# Patient Record
Sex: Female | Born: 1940 | Race: White | Hispanic: No | Smoking: Never smoker
Health system: Southern US, Community
[De-identification: ages and names within clinical notes are randomized; demographics above are authoritative.]

## PROBLEM LIST (undated history)

## (undated) DIAGNOSIS — K5792 Diverticulitis of intestine, part unspecified, without perforation or abscess without bleeding: Secondary | ICD-10-CM

## (undated) DIAGNOSIS — I10 Essential (primary) hypertension: Secondary | ICD-10-CM

## (undated) DIAGNOSIS — K219 Gastro-esophageal reflux disease without esophagitis: Secondary | ICD-10-CM

## (undated) DIAGNOSIS — E785 Hyperlipidemia, unspecified: Secondary | ICD-10-CM

## (undated) DIAGNOSIS — I38 Endocarditis, valve unspecified: Secondary | ICD-10-CM

## (undated) DIAGNOSIS — M1990 Unspecified osteoarthritis, unspecified site: Secondary | ICD-10-CM

## (undated) DIAGNOSIS — C801 Malignant (primary) neoplasm, unspecified: Secondary | ICD-10-CM

## (undated) DIAGNOSIS — D649 Anemia, unspecified: Secondary | ICD-10-CM

## (undated) DIAGNOSIS — R7611 Nonspecific reaction to tuberculin skin test without active tuberculosis: Secondary | ICD-10-CM

## (undated) DIAGNOSIS — M069 Rheumatoid arthritis, unspecified: Secondary | ICD-10-CM

## (undated) DIAGNOSIS — M810 Age-related osteoporosis without current pathological fracture: Secondary | ICD-10-CM

## (undated) HISTORY — DX: Unspecified osteoarthritis, unspecified site: M19.90

## (undated) HISTORY — DX: Essential (primary) hypertension: I10

## (undated) HISTORY — DX: Age-related osteoporosis without current pathological fracture: M81.0

## (undated) HISTORY — PX: OVARY SURGERY: SHX727

## (undated) HISTORY — DX: Rheumatoid arthritis, unspecified: M06.9

## (undated) HISTORY — DX: Nonspecific reaction to tuberculin skin test without active tuberculosis: R76.11

## (undated) HISTORY — DX: Anemia, unspecified: D64.9

## (undated) HISTORY — DX: Gastro-esophageal reflux disease without esophagitis: K21.9

## (undated) HISTORY — PX: ABDOMINAL HYSTERECTOMY: SHX81

## (undated) HISTORY — PX: CERVICAL CONE BIOPSY: SUR198

## (undated) HISTORY — PX: TUBAL LIGATION: SHX77

## (undated) HISTORY — DX: Hyperlipidemia, unspecified: E78.5

## (undated) HISTORY — DX: Diverticulitis of intestine, part unspecified, without perforation or abscess without bleeding: K57.92

## (undated) HISTORY — DX: Endocarditis, valve unspecified: I38

---
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
## 2004-08-02 ENCOUNTER — Ambulatory Visit: Payer: Self-pay | Admitting: Emergency Medicine

## 2004-08-02 ENCOUNTER — Emergency Department: Payer: Self-pay | Admitting: Emergency Medicine

## 2004-08-03 ENCOUNTER — Ambulatory Visit: Payer: Self-pay | Admitting: Emergency Medicine

## 2004-12-08 ENCOUNTER — Ambulatory Visit: Payer: Self-pay | Admitting: Unknown Physician Specialty

## 2004-12-13 ENCOUNTER — Ambulatory Visit: Payer: Self-pay | Admitting: Unknown Physician Specialty

## 2004-12-30 ENCOUNTER — Ambulatory Visit: Payer: Self-pay | Admitting: Unknown Physician Specialty

## 2005-03-31 ENCOUNTER — Inpatient Hospital Stay: Payer: Self-pay | Admitting: Unknown Physician Specialty

## 2005-05-31 ENCOUNTER — Ambulatory Visit: Payer: Self-pay | Admitting: Internal Medicine

## 2005-06-08 ENCOUNTER — Ambulatory Visit: Payer: Self-pay

## 2005-06-30 ENCOUNTER — Ambulatory Visit: Payer: Self-pay | Admitting: Pain Medicine

## 2005-07-13 ENCOUNTER — Ambulatory Visit: Payer: Self-pay | Admitting: Pain Medicine

## 2005-08-16 ENCOUNTER — Ambulatory Visit: Payer: Self-pay | Admitting: Pain Medicine

## 2005-08-24 ENCOUNTER — Ambulatory Visit: Payer: Self-pay | Admitting: Pain Medicine

## 2005-10-04 ENCOUNTER — Ambulatory Visit: Payer: Self-pay | Admitting: Pain Medicine

## 2005-10-10 ENCOUNTER — Ambulatory Visit: Payer: Self-pay | Admitting: Pain Medicine

## 2005-11-17 ENCOUNTER — Ambulatory Visit: Payer: Self-pay | Admitting: Pain Medicine

## 2005-11-23 ENCOUNTER — Ambulatory Visit: Payer: Self-pay | Admitting: Pain Medicine

## 2006-01-03 ENCOUNTER — Ambulatory Visit: Payer: Self-pay | Admitting: Pain Medicine

## 2006-01-09 ENCOUNTER — Ambulatory Visit: Payer: Self-pay | Admitting: Pain Medicine

## 2006-02-09 ENCOUNTER — Ambulatory Visit: Payer: Self-pay | Admitting: Pain Medicine

## 2006-03-01 ENCOUNTER — Ambulatory Visit: Payer: Self-pay | Admitting: Pain Medicine

## 2006-05-11 ENCOUNTER — Ambulatory Visit: Payer: Self-pay | Admitting: Pain Medicine

## 2006-06-06 ENCOUNTER — Ambulatory Visit: Payer: Self-pay | Admitting: Internal Medicine

## 2006-06-27 ENCOUNTER — Ambulatory Visit: Payer: Self-pay | Admitting: Pain Medicine

## 2006-08-29 ENCOUNTER — Ambulatory Visit: Payer: Self-pay | Admitting: Pain Medicine

## 2006-11-21 ENCOUNTER — Ambulatory Visit: Payer: Self-pay | Admitting: Pain Medicine

## 2006-12-10 ENCOUNTER — Ambulatory Visit: Payer: Self-pay | Admitting: Internal Medicine

## 2006-12-20 ENCOUNTER — Ambulatory Visit: Payer: Self-pay | Admitting: Internal Medicine

## 2007-01-09 ENCOUNTER — Ambulatory Visit: Payer: Self-pay | Admitting: Internal Medicine

## 2007-03-01 ENCOUNTER — Ambulatory Visit: Payer: Self-pay | Admitting: Pain Medicine

## 2007-05-22 ENCOUNTER — Ambulatory Visit: Payer: Self-pay | Admitting: Pain Medicine

## 2007-07-19 ENCOUNTER — Ambulatory Visit: Payer: Self-pay | Admitting: Internal Medicine

## 2007-08-14 ENCOUNTER — Ambulatory Visit: Payer: Self-pay | Admitting: Pain Medicine

## 2007-09-12 ENCOUNTER — Ambulatory Visit: Payer: Self-pay | Admitting: Neurosurgery

## 2007-10-02 ENCOUNTER — Ambulatory Visit: Payer: Self-pay | Admitting: Pain Medicine

## 2007-12-04 ENCOUNTER — Ambulatory Visit: Payer: Self-pay | Admitting: Pain Medicine

## 2008-03-18 ENCOUNTER — Ambulatory Visit: Payer: Self-pay | Admitting: Pain Medicine

## 2008-08-07 ENCOUNTER — Ambulatory Visit: Payer: Self-pay | Admitting: Pain Medicine

## 2008-08-07 ENCOUNTER — Ambulatory Visit: Payer: Self-pay | Admitting: Internal Medicine

## 2008-08-14 ENCOUNTER — Ambulatory Visit: Payer: Self-pay | Admitting: Internal Medicine

## 2008-08-16 ENCOUNTER — Inpatient Hospital Stay: Payer: Self-pay | Admitting: Internal Medicine

## 2008-08-27 ENCOUNTER — Ambulatory Visit: Payer: Self-pay | Admitting: Internal Medicine

## 2008-11-04 ENCOUNTER — Ambulatory Visit: Payer: Self-pay | Admitting: Pain Medicine

## 2008-11-18 ENCOUNTER — Ambulatory Visit: Payer: Self-pay | Admitting: Rheumatology

## 2009-03-05 ENCOUNTER — Ambulatory Visit: Payer: Self-pay | Admitting: Pain Medicine

## 2009-07-08 ENCOUNTER — Ambulatory Visit: Payer: Self-pay | Admitting: Pain Medicine

## 2009-10-23 ENCOUNTER — Emergency Department: Payer: Self-pay | Admitting: Emergency Medicine

## 2009-10-29 ENCOUNTER — Ambulatory Visit: Payer: Self-pay | Admitting: Internal Medicine

## 2010-09-21 ENCOUNTER — Ambulatory Visit: Payer: Self-pay | Admitting: Internal Medicine

## 2010-11-24 ENCOUNTER — Ambulatory Visit: Payer: Self-pay | Admitting: Internal Medicine

## 2011-07-13 DIAGNOSIS — E039 Hypothyroidism, unspecified: Secondary | ICD-10-CM | POA: Diagnosis not present

## 2011-07-21 DIAGNOSIS — J339 Nasal polyp, unspecified: Secondary | ICD-10-CM | POA: Diagnosis not present

## 2011-08-23 DIAGNOSIS — M069 Rheumatoid arthritis, unspecified: Secondary | ICD-10-CM | POA: Diagnosis not present

## 2011-09-19 DIAGNOSIS — E119 Type 2 diabetes mellitus without complications: Secondary | ICD-10-CM | POA: Diagnosis not present

## 2011-09-19 DIAGNOSIS — I10 Essential (primary) hypertension: Secondary | ICD-10-CM | POA: Diagnosis not present

## 2011-09-19 DIAGNOSIS — I2510 Atherosclerotic heart disease of native coronary artery without angina pectoris: Secondary | ICD-10-CM | POA: Diagnosis not present

## 2011-09-19 DIAGNOSIS — E7800 Pure hypercholesterolemia, unspecified: Secondary | ICD-10-CM | POA: Diagnosis not present

## 2011-09-19 DIAGNOSIS — R5381 Other malaise: Secondary | ICD-10-CM | POA: Diagnosis not present

## 2011-09-19 DIAGNOSIS — R7989 Other specified abnormal findings of blood chemistry: Secondary | ICD-10-CM | POA: Diagnosis not present

## 2011-09-19 DIAGNOSIS — D649 Anemia, unspecified: Secondary | ICD-10-CM | POA: Diagnosis not present

## 2011-09-23 DIAGNOSIS — R05 Cough: Secondary | ICD-10-CM | POA: Diagnosis not present

## 2011-09-26 DIAGNOSIS — E7800 Pure hypercholesterolemia, unspecified: Secondary | ICD-10-CM | POA: Diagnosis not present

## 2011-09-26 DIAGNOSIS — M1990 Unspecified osteoarthritis, unspecified site: Secondary | ICD-10-CM | POA: Diagnosis not present

## 2011-09-26 DIAGNOSIS — R350 Frequency of micturition: Secondary | ICD-10-CM | POA: Diagnosis not present

## 2011-09-26 DIAGNOSIS — D649 Anemia, unspecified: Secondary | ICD-10-CM | POA: Diagnosis not present

## 2011-09-26 DIAGNOSIS — I10 Essential (primary) hypertension: Secondary | ICD-10-CM | POA: Diagnosis not present

## 2011-10-03 DIAGNOSIS — R05 Cough: Secondary | ICD-10-CM | POA: Diagnosis not present

## 2011-10-12 DIAGNOSIS — J40 Bronchitis, not specified as acute or chronic: Secondary | ICD-10-CM | POA: Diagnosis not present

## 2011-10-19 DIAGNOSIS — R05 Cough: Secondary | ICD-10-CM | POA: Diagnosis not present

## 2011-10-19 DIAGNOSIS — J310 Chronic rhinitis: Secondary | ICD-10-CM | POA: Diagnosis not present

## 2011-11-01 DIAGNOSIS — M069 Rheumatoid arthritis, unspecified: Secondary | ICD-10-CM | POA: Diagnosis not present

## 2011-11-07 DIAGNOSIS — R05 Cough: Secondary | ICD-10-CM | POA: Diagnosis not present

## 2011-11-07 DIAGNOSIS — J45909 Unspecified asthma, uncomplicated: Secondary | ICD-10-CM | POA: Diagnosis not present

## 2011-11-07 DIAGNOSIS — J310 Chronic rhinitis: Secondary | ICD-10-CM | POA: Diagnosis not present

## 2011-11-10 DIAGNOSIS — I10 Essential (primary) hypertension: Secondary | ICD-10-CM | POA: Diagnosis not present

## 2011-11-10 DIAGNOSIS — K219 Gastro-esophageal reflux disease without esophagitis: Secondary | ICD-10-CM | POA: Diagnosis not present

## 2011-11-10 DIAGNOSIS — E119 Type 2 diabetes mellitus without complications: Secondary | ICD-10-CM | POA: Diagnosis not present

## 2011-11-10 DIAGNOSIS — E7800 Pure hypercholesterolemia, unspecified: Secondary | ICD-10-CM | POA: Diagnosis not present

## 2011-11-28 ENCOUNTER — Ambulatory Visit: Payer: Self-pay | Admitting: Internal Medicine

## 2011-11-28 DIAGNOSIS — N6459 Other signs and symptoms in breast: Secondary | ICD-10-CM | POA: Diagnosis not present

## 2011-11-28 DIAGNOSIS — Z1231 Encounter for screening mammogram for malignant neoplasm of breast: Secondary | ICD-10-CM | POA: Diagnosis not present

## 2011-11-30 ENCOUNTER — Ambulatory Visit: Payer: Self-pay | Admitting: Internal Medicine

## 2011-11-30 DIAGNOSIS — N6489 Other specified disorders of breast: Secondary | ICD-10-CM | POA: Diagnosis not present

## 2011-11-30 DIAGNOSIS — N6459 Other signs and symptoms in breast: Secondary | ICD-10-CM | POA: Diagnosis not present

## 2011-12-13 DIAGNOSIS — M069 Rheumatoid arthritis, unspecified: Secondary | ICD-10-CM | POA: Diagnosis not present

## 2011-12-29 DIAGNOSIS — K219 Gastro-esophageal reflux disease without esophagitis: Secondary | ICD-10-CM | POA: Diagnosis not present

## 2011-12-29 DIAGNOSIS — I10 Essential (primary) hypertension: Secondary | ICD-10-CM | POA: Diagnosis not present

## 2012-01-03 DIAGNOSIS — I10 Essential (primary) hypertension: Secondary | ICD-10-CM | POA: Diagnosis not present

## 2012-01-03 DIAGNOSIS — J45909 Unspecified asthma, uncomplicated: Secondary | ICD-10-CM | POA: Diagnosis not present

## 2012-01-03 DIAGNOSIS — J310 Chronic rhinitis: Secondary | ICD-10-CM | POA: Diagnosis not present

## 2012-01-03 DIAGNOSIS — R05 Cough: Secondary | ICD-10-CM | POA: Diagnosis not present

## 2012-01-06 DIAGNOSIS — Z79899 Other long term (current) drug therapy: Secondary | ICD-10-CM | POA: Diagnosis not present

## 2012-01-06 DIAGNOSIS — E7800 Pure hypercholesterolemia, unspecified: Secondary | ICD-10-CM | POA: Diagnosis not present

## 2012-01-06 DIAGNOSIS — D649 Anemia, unspecified: Secondary | ICD-10-CM | POA: Diagnosis not present

## 2012-01-06 DIAGNOSIS — K219 Gastro-esophageal reflux disease without esophagitis: Secondary | ICD-10-CM | POA: Diagnosis not present

## 2012-01-06 DIAGNOSIS — R7989 Other specified abnormal findings of blood chemistry: Secondary | ICD-10-CM | POA: Diagnosis not present

## 2012-01-13 DIAGNOSIS — R05 Cough: Secondary | ICD-10-CM | POA: Diagnosis not present

## 2012-01-13 DIAGNOSIS — D649 Anemia, unspecified: Secondary | ICD-10-CM | POA: Diagnosis not present

## 2012-01-13 DIAGNOSIS — I10 Essential (primary) hypertension: Secondary | ICD-10-CM | POA: Diagnosis not present

## 2012-01-13 DIAGNOSIS — E7800 Pure hypercholesterolemia, unspecified: Secondary | ICD-10-CM | POA: Diagnosis not present

## 2012-01-13 DIAGNOSIS — G609 Hereditary and idiopathic neuropathy, unspecified: Secondary | ICD-10-CM | POA: Diagnosis not present

## 2012-01-19 DIAGNOSIS — R05 Cough: Secondary | ICD-10-CM | POA: Diagnosis not present

## 2012-01-19 DIAGNOSIS — M069 Rheumatoid arthritis, unspecified: Secondary | ICD-10-CM | POA: Diagnosis not present

## 2012-02-08 DIAGNOSIS — H2510 Age-related nuclear cataract, unspecified eye: Secondary | ICD-10-CM | POA: Diagnosis not present

## 2012-02-28 DIAGNOSIS — G609 Hereditary and idiopathic neuropathy, unspecified: Secondary | ICD-10-CM | POA: Diagnosis not present

## 2012-02-28 DIAGNOSIS — M069 Rheumatoid arthritis, unspecified: Secondary | ICD-10-CM | POA: Diagnosis not present

## 2012-03-20 DIAGNOSIS — R609 Edema, unspecified: Secondary | ICD-10-CM | POA: Diagnosis not present

## 2012-03-29 DIAGNOSIS — M810 Age-related osteoporosis without current pathological fracture: Secondary | ICD-10-CM | POA: Diagnosis not present

## 2012-03-29 DIAGNOSIS — R609 Edema, unspecified: Secondary | ICD-10-CM | POA: Diagnosis not present

## 2012-03-29 DIAGNOSIS — M069 Rheumatoid arthritis, unspecified: Secondary | ICD-10-CM | POA: Diagnosis not present

## 2012-03-29 DIAGNOSIS — J811 Chronic pulmonary edema: Secondary | ICD-10-CM | POA: Diagnosis not present

## 2012-04-05 DIAGNOSIS — M069 Rheumatoid arthritis, unspecified: Secondary | ICD-10-CM | POA: Diagnosis not present

## 2012-04-05 DIAGNOSIS — R609 Edema, unspecified: Secondary | ICD-10-CM | POA: Diagnosis not present

## 2012-04-10 DIAGNOSIS — Z79899 Other long term (current) drug therapy: Secondary | ICD-10-CM | POA: Diagnosis not present

## 2012-04-10 DIAGNOSIS — M069 Rheumatoid arthritis, unspecified: Secondary | ICD-10-CM | POA: Diagnosis not present

## 2012-05-11 DIAGNOSIS — I2510 Atherosclerotic heart disease of native coronary artery without angina pectoris: Secondary | ICD-10-CM | POA: Diagnosis not present

## 2012-05-11 DIAGNOSIS — I359 Nonrheumatic aortic valve disorder, unspecified: Secondary | ICD-10-CM | POA: Diagnosis not present

## 2012-05-11 DIAGNOSIS — E782 Mixed hyperlipidemia: Secondary | ICD-10-CM | POA: Diagnosis not present

## 2012-05-11 DIAGNOSIS — I059 Rheumatic mitral valve disease, unspecified: Secondary | ICD-10-CM | POA: Diagnosis not present

## 2012-05-11 DIAGNOSIS — I10 Essential (primary) hypertension: Secondary | ICD-10-CM | POA: Diagnosis not present

## 2012-05-22 DIAGNOSIS — M069 Rheumatoid arthritis, unspecified: Secondary | ICD-10-CM | POA: Diagnosis not present

## 2012-05-24 DIAGNOSIS — I059 Rheumatic mitral valve disease, unspecified: Secondary | ICD-10-CM | POA: Diagnosis not present

## 2012-06-21 DIAGNOSIS — D1039 Benign neoplasm of other parts of mouth: Secondary | ICD-10-CM | POA: Diagnosis not present

## 2012-06-21 DIAGNOSIS — B977 Papillomavirus as the cause of diseases classified elsewhere: Secondary | ICD-10-CM | POA: Diagnosis not present

## 2012-06-21 DIAGNOSIS — J339 Nasal polyp, unspecified: Secondary | ICD-10-CM | POA: Diagnosis not present

## 2012-06-21 DIAGNOSIS — D369 Benign neoplasm, unspecified site: Secondary | ICD-10-CM | POA: Diagnosis not present

## 2012-06-22 DIAGNOSIS — M25569 Pain in unspecified knee: Secondary | ICD-10-CM | POA: Diagnosis not present

## 2012-06-22 DIAGNOSIS — M069 Rheumatoid arthritis, unspecified: Secondary | ICD-10-CM | POA: Diagnosis not present

## 2012-07-02 DIAGNOSIS — M069 Rheumatoid arthritis, unspecified: Secondary | ICD-10-CM | POA: Diagnosis not present

## 2012-08-07 DIAGNOSIS — M069 Rheumatoid arthritis, unspecified: Secondary | ICD-10-CM | POA: Diagnosis not present

## 2012-08-10 ENCOUNTER — Encounter: Payer: Self-pay | Admitting: Internal Medicine

## 2012-08-10 ENCOUNTER — Ambulatory Visit (INDEPENDENT_AMBULATORY_CARE_PROVIDER_SITE_OTHER): Payer: Medicare Other | Admitting: Internal Medicine

## 2012-08-10 VITALS — BP 140/70 | HR 74 | Temp 98.8°F | Ht 66.5 in | Wt 173.2 lb

## 2012-08-10 DIAGNOSIS — K219 Gastro-esophageal reflux disease without esophagitis: Secondary | ICD-10-CM

## 2012-08-10 DIAGNOSIS — R5383 Other fatigue: Secondary | ICD-10-CM

## 2012-08-10 DIAGNOSIS — D649 Anemia, unspecified: Secondary | ICD-10-CM

## 2012-08-10 DIAGNOSIS — R0681 Apnea, not elsewhere classified: Secondary | ICD-10-CM | POA: Diagnosis not present

## 2012-08-10 DIAGNOSIS — E7800 Pure hypercholesterolemia, unspecified: Secondary | ICD-10-CM

## 2012-08-10 DIAGNOSIS — R002 Palpitations: Secondary | ICD-10-CM | POA: Diagnosis not present

## 2012-08-10 DIAGNOSIS — R5381 Other malaise: Secondary | ICD-10-CM | POA: Diagnosis not present

## 2012-08-10 DIAGNOSIS — I10 Essential (primary) hypertension: Secondary | ICD-10-CM

## 2012-08-10 DIAGNOSIS — M069 Rheumatoid arthritis, unspecified: Secondary | ICD-10-CM

## 2012-08-11 MED ORDER — FUROSEMIDE 20 MG PO TABS: 20.0000 mg | ORAL_TABLET | Freq: Every day | ORAL | Status: DC

## 2012-08-12 ENCOUNTER — Encounter: Payer: Self-pay | Admitting: Internal Medicine

## 2012-08-12 DIAGNOSIS — I10 Essential (primary) hypertension: Secondary | ICD-10-CM | POA: Insufficient documentation

## 2012-08-12 DIAGNOSIS — E7800 Pure hypercholesterolemia, unspecified: Secondary | ICD-10-CM | POA: Insufficient documentation

## 2012-08-12 DIAGNOSIS — M1990 Unspecified osteoarthritis, unspecified site: Secondary | ICD-10-CM | POA: Insufficient documentation

## 2012-08-12 DIAGNOSIS — E785 Hyperlipidemia, unspecified: Secondary | ICD-10-CM | POA: Insufficient documentation

## 2012-08-12 DIAGNOSIS — D649 Anemia, unspecified: Secondary | ICD-10-CM | POA: Insufficient documentation

## 2012-08-12 DIAGNOSIS — K219 Gastro-esophageal reflux disease without esophagitis: Secondary | ICD-10-CM | POA: Insufficient documentation

## 2012-08-12 DIAGNOSIS — M069 Rheumatoid arthritis, unspecified: Secondary | ICD-10-CM | POA: Insufficient documentation

## 2012-08-12 NOTE — Assessment & Plan Note (Signed)
On zocor.  Low cholesterol diet and exercise.  Check lipid panel and liver function.

## 2012-08-12 NOTE — Assessment & Plan Note (Signed)
Recent flare.  On prednisone taper.  Continues to follow up with Dr Gavin Potters.  On remicade.

## 2012-08-12 NOTE — Assessment & Plan Note (Signed)
Recheck cbc.  

## 2012-08-12 NOTE — Assessment & Plan Note (Signed)
Controlled on nexium.  

## 2012-08-12 NOTE — Assessment & Plan Note (Addendum)
Blood pressure as outlined.  Will restart lasix q day.  Follow pressures.  Check metabolic panel.  Will need close intra op and post op monitoring of heart rate and blood pressure to avoid extremes.

## 2012-08-12 NOTE — Progress Notes (Signed)
Subjective:    Patient ID: Kaitlyn Huff, female    DOB: 04-03-1941, 72 y.o.   MRN: [id number]  HPI 72 year old female with past history of hypertension, hypercholesterolemia, GERD, depression and rheumatoid arthritis who comes in today for a pre op evaluation.  She is accompanied by her daughter.  History obtained from both of them.  She states she has not been sleeping well.  Her daughter reports that she has spells at night where she has pauses when she is breathing.  Snores.  She has also had some increased swelling.  Was on fluid pills.  Has been off now for approximately two days and has noticed more swelling.  She gets sob with exertion, but states she feels this is stable.  Just had an ECHO (by Dr Lady Gary).  States she was told there was a change and that Dr Lady Gary wanted to follow up with her.  Has an appt in March.  No chest pain or tightness.  She has noticed some palpitations (intermittently) when she lies down.  No nausea or vomiting.     Past Medical History  Diagnosis Date  . Rheumatoid arthritis     MTX transaminitis, Leflunomide (rash), enbrel, plaquinil, prednisone, remicade, Imuran (transaminitis)  . Hyperlipidemia   . Diverticulitis   . Osteoarthritis   . GERD (gastroesophageal reflux disease)   . Hypertension   . Anemia   . Positive PPD     s/p INH (2006)  . Osteoporosis     actonel  . 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 Azelastine-Fluticasone (DYMISTA) 137-50 MCG/ACT SUSP Place into the nose.      . calcium-vitamin D (OSCAL 500/200 D-3) 500-200 MG-UNIT per tablet Take 1 tablet by mouth 2 (two) times daily.      . cetirizine (ZYRTEC) 10 MG tablet Take 10 mg by mouth daily.      Marland Kitchen esomeprazole (NEXIUM) 40 MG capsule Take 40 mg by mouth 2 (two) times daily.      . Fluticasone-Salmeterol (ADVAIR) 250-50 MCG/DOSE AEPB Inhale 1 puff into the lungs every 12 (twelve)  hours.      . furosemide (LASIX) 20 MG tablet Take 1 tablet (20 mg total) by mouth daily.  90 tablet  0  . gabapentin (NEURONTIN) 300 MG capsule Take 300 mg by mouth 4 (four) times daily.      . metolazone (ZAROXOLYN) 5 MG tablet Take 5 mg by mouth daily.      . metoprolol succinate (TOPROL-XL) 50 MG 24 hr tablet Take 50 mg by mouth daily. Take with or immediately following a meal.      . potassium chloride SA (K-DUR,KLOR-CON) 20 MEQ tablet Take 20 mEq by mouth daily.      . simvastatin (ZOCOR) 20 MG tablet Take 20 mg by mouth every evening.        Review of Systems Patient denies any headache, lightheadedness or dizziness.  No significant sinus or allergy symptoms.  No chest pain or tightness.  Some palpitations - noticed at night.   No increased shortness of breath.  Notices some sob with exertion, but states her breathing is stable.  No cough or congestion.  No nausea or vomiting.  No acid reflux.   No abdominal pain or cramping.  No bowel change, such as diarrhea, constipation, BRBPR or melana.  No urine change.   Increased swelling  as outlined.  Questionable witnessed apneic episodes as outlined.       Objective:   Physical Exam Filed Vitals:   08/10/12 1502  BP: 140/70  Pulse: 74  Temp: 98.8 F (37.1 C)   Blood pressure recheck:  92/87  72 year old female in no acute distress.   HEENT:  Nares- clear.  Oropharynx - without lesions. NECK:  Supple.  Nontender.  No audible bruit.  HEART:  Appears to be regular. LUNGS:  No crackles or wheezing audible.  Respirations even and unlabored.  RADIAL PULSE:  Equal bilaterally.   ABDOMEN:  Soft, nontender.  Bowel sounds present and normal.  No audible abdominal bruit.    EXTREMITIES:  Minimal increased edema present.  DP pulses palpable and equal bilaterally.           Assessment & Plan:  PRE OP EVALUATION.  With the increased swelling, palpitations and windedness with exertion - EKG obtained and revealed SR with no acute ischemic  changes.  She reports having a recent ECHO. Per her report -changes.  Will schedule an earlier appt with Dr Lady Gary.  Will need cardiac clearance - prior to her surgery.    CARDIOVASCULAR.  See above.  Schedule an appt with Dr Lady Gary for further evaluation and for cardiac clearance prior to her surgery.  EDEMA.  Some increased swelling as outlined.  Has been on lasix and zaroxolyn - intermittently.  Off now.  Will restart lasix daily.  Follow. Check metabolic panel.    PULMONARY.  Increased snoring and possible witnessed apneic episodes.  Increased fatigue.  Not sleeping well.  Will schedule a split night sleep study.    INCREASED PSYCHOSOCIAL STRESSORS.  Doing well.  Follow.    HEALTH MAINTENANCE.  Will schedule her for a physical.  She is s/p hysterectomy.  Mammogram 11/28/11 recommended additional views.  11/30/11 follow up mammogram - BiRADS II.

## 2012-08-13 DIAGNOSIS — J339 Nasal polyp, unspecified: Secondary | ICD-10-CM | POA: Diagnosis not present

## 2012-08-15 ENCOUNTER — Telehealth: Payer: Self-pay | Admitting: Internal Medicine

## 2012-08-15 ENCOUNTER — Ambulatory Visit: Payer: Self-pay | Admitting: Internal Medicine

## 2012-08-15 NOTE — Telephone Encounter (Signed)
Pt is having surgery on the 15th and she had said she wanted to see you before her surgery ???

## 2012-08-15 NOTE — Telephone Encounter (Signed)
See if she can come in at 1:45 on Monday 08/20/12.   Thanks.

## 2012-08-15 NOTE — Telephone Encounter (Signed)
Pt stated she could not come she has an appointment with Dr Lady Gary @ 10:45.

## 2012-08-15 NOTE — Telephone Encounter (Signed)
See if pt can come in 11:45 on Friday, 08/17/12.  Thanks.

## 2012-08-16 NOTE — Telephone Encounter (Signed)
Left message for pt to call office

## 2012-08-16 NOTE — Telephone Encounter (Signed)
Patient returned Robin's call and was added to Monday's schedule at 1:45 per Dr. Lorin Picket.

## 2012-08-17 DIAGNOSIS — I2789 Other specified pulmonary heart diseases: Secondary | ICD-10-CM | POA: Diagnosis not present

## 2012-08-17 DIAGNOSIS — I10 Essential (primary) hypertension: Secondary | ICD-10-CM | POA: Diagnosis not present

## 2012-08-20 ENCOUNTER — Ambulatory Visit (INDEPENDENT_AMBULATORY_CARE_PROVIDER_SITE_OTHER): Payer: Medicare Other | Admitting: Internal Medicine

## 2012-08-20 ENCOUNTER — Ambulatory Visit: Admitting: Internal Medicine

## 2012-08-20 ENCOUNTER — Encounter: Payer: Self-pay | Admitting: Internal Medicine

## 2012-08-20 VITALS — BP 120/72 | HR 69 | Temp 97.6°F | Ht 65.0 in | Wt 170.0 lb

## 2012-08-20 DIAGNOSIS — D649 Anemia, unspecified: Secondary | ICD-10-CM

## 2012-08-20 DIAGNOSIS — I10 Essential (primary) hypertension: Secondary | ICD-10-CM | POA: Diagnosis not present

## 2012-08-20 DIAGNOSIS — R0681 Apnea, not elsewhere classified: Secondary | ICD-10-CM

## 2012-08-20 DIAGNOSIS — E7800 Pure hypercholesterolemia, unspecified: Secondary | ICD-10-CM

## 2012-08-20 DIAGNOSIS — R002 Palpitations: Secondary | ICD-10-CM

## 2012-08-20 DIAGNOSIS — M069 Rheumatoid arthritis, unspecified: Secondary | ICD-10-CM

## 2012-08-20 DIAGNOSIS — K219 Gastro-esophageal reflux disease without esophagitis: Secondary | ICD-10-CM

## 2012-08-20 LAB — BASIC METABOLIC PANEL
BUN: 12 mg/dL (ref 6–23)
Calcium: 9.1 mg/dL (ref 8.4–10.5)
GFR: 62.7 mL/min (ref >60.00)
Glucose, Bld: 93 mg/dL (ref 70–99)
Potassium: 4.1 mEq/L (ref 3.5–5.1)

## 2012-08-20 LAB — LIPID PANEL
Cholesterol: 165 mg/dL (ref 0–200)
LDL Cholesterol: 65 mg/dL (ref 0–99)
Triglycerides: 163 mg/dL — ABNORMAL HIGH (ref 0.0–149.0)
VLDL: 32.6 mg/dL (ref 0.0–40.0)

## 2012-08-20 LAB — CBC WITH DIFFERENTIAL/PLATELET
Basophils Absolute: 0.1 10*3/uL (ref 0.0–0.1)
Eosinophils Relative: 2.8 % (ref 0.0–5.0)
Monocytes Absolute: 0.8 10*3/uL (ref 0.1–1.0)
Monocytes Relative: 8.2 % (ref 3.0–12.0)
Neutrophils Relative %: 64.8 % (ref 43.0–77.0)
Platelets: 299 10*3/uL (ref 150.0–400.0)
RDW: 14 % (ref 11.5–14.6)
WBC: 10.2 10*3/uL (ref 4.5–10.5)

## 2012-08-20 LAB — HEPATIC FUNCTION PANEL
ALT: 35 U/L (ref 0–35)
AST: 35 U/L (ref 0–37)
Bilirubin, Direct: 0.1 mg/dL (ref 0.0–0.3)
Total Protein: 8 g/dL (ref 6.0–8.3)

## 2012-08-21 ENCOUNTER — Encounter: Payer: Self-pay | Admitting: Internal Medicine

## 2012-08-21 ENCOUNTER — Ambulatory Visit: Payer: Self-pay | Admitting: Anesthesiology

## 2012-08-21 ENCOUNTER — Telehealth: Payer: Self-pay | Admitting: Internal Medicine

## 2012-08-21 DIAGNOSIS — M5020 Other cervical disc displacement, unspecified cervical region: Secondary | ICD-10-CM | POA: Diagnosis not present

## 2012-08-21 DIAGNOSIS — M069 Rheumatoid arthritis, unspecified: Secondary | ICD-10-CM | POA: Diagnosis not present

## 2012-08-21 NOTE — Assessment & Plan Note (Signed)
Low cholesterol diet.  On simvastatin.  Check lipid profile and liver panel.

## 2012-08-21 NOTE — Assessment & Plan Note (Signed)
Check cbc prior to surgery to confirm stable.

## 2012-08-21 NOTE — Telephone Encounter (Signed)
Left message for pt to call office

## 2012-08-21 NOTE — Assessment & Plan Note (Signed)
Blood pressure is doing well.  Same medication regimen.  Check metabolic panel.  

## 2012-08-21 NOTE — Progress Notes (Signed)
Subjective:    Patient ID: Kaitlyn Huff, female    DOB: 1940/10/25, 72 y.o.   MRN: [id number]  HPI 72 year old female with past history of hypertension, hypercholesterolemia, GERD, depression and rheumatoid arthritis who comes in today for a pre op evaluation.  Was just seen last week.  Started back on lasix daily.  Was instructed to see cardiology for evaluation prior to her surgery.  States she saw Dr Lady Gary and that he released her for surgery.  She comes in today stating she feels her breathing is stable.  No increased sob.  No chest pain or tightness.  No increased cough or congestion.  Swelling better.  No nausea or vomiting.  Bowels stable now.      Past Medical History  Diagnosis Date  . Rheumatoid arthritis     MTX transaminitis, Leflunomide (rash), enbrel, plaquinil, prednisone, remicade, Imuran (transaminitis)  . Hyperlipidemia   . Diverticulitis   . Osteoarthritis   . GERD (gastroesophageal reflux disease)   . Hypertension   . Anemia   . Positive PPD     s/p INH (2006)  . Osteoporosis     actonel  . 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 aspirin 81 MG tablet Take 81 mg by mouth daily.      . Azelastine-Fluticasone (DYMISTA) 137-50 MCG/ACT SUSP Place into the nose.      . calcium-vitamin D (OSCAL 500/200 D-3) 500-200 MG-UNIT per tablet Take 1 tablet by mouth 2 (two) times daily.      . cetirizine (ZYRTEC) 10 MG tablet Take 10 mg by mouth daily.      Marland Kitchen esomeprazole (NEXIUM) 40 MG capsule Take 40 mg by mouth 2 (two) times daily.      Marland Kitchen FeFum-FePo-FA-B Cmp-C-Zn-Mn-Cu (TANDEM PLUS PO) Take by mouth daily.      . Fluticasone-Salmeterol (ADVAIR) 250-50 MCG/DOSE AEPB Inhale 1 puff into the lungs every 12 (twelve) hours.      . furosemide (LASIX) 20 MG tablet Take 1 tablet (20 mg total) by mouth daily.  90 tablet  0  . gabapentin (NEURONTIN) 300 MG capsule Take 300 mg by  mouth 4 (four) times daily.      . hydroxychloroquine (PLAQUENIL) 200 MG tablet Take by mouth 2 (two) times daily.      . metolazone (ZAROXOLYN) 5 MG tablet Take 5 mg by mouth daily.      . metoprolol succinate (TOPROL-XL) 50 MG 24 hr tablet Take 50 mg by mouth daily. Take with or immediately following a meal.      . Multiple Vitamin (MULTIVITAMIN) capsule Take 1 capsule by mouth daily.      . potassium chloride SA (K-DUR,KLOR-CON) 20 MEQ tablet Take 20 mEq by mouth daily.      . prednisoLONE 5 MG TABS Take by mouth daily.      . risedronate (ACTONEL) 150 MG tablet Take 150 mg by mouth every 30 (thirty) days. with water on empty stomach, nothing by mouth or lie down for next 30 minutes.      . simvastatin (ZOCOR) 20 MG tablet Take 20 mg by mouth every evening.       No current facility-administered medications on file prior to visit.    Review of Systems Patient denies any headache, lightheadedness or dizziness.  No significant sinus or allergy symptoms.  No chest pain or tightness.  No palpitations reported now.  No increased shortness of breath.  States her breathing is stable.  No cough or congestion.  No nausea or vomiting.  No acid reflux.   No abdominal pain or cramping.  No bowel change, such as diarrhea, constipation, BRBPR or melana.  No urine change.   Swelling improved.  Scheduled for her sleep study later this month.         Objective:   Physical Exam  Filed Vitals:   08/20/12 1204  BP: 120/72  Pulse: 69  Temp: 97.6 F (36.4 C)   Blood pressure recheck:  22/51  72 year old female in no acute distress.   HEENT:  Nares- clear.  Oropharynx - without lesions. NECK:  Supple.  Nontender.  No audible bruit.  HEART:  Appears to be regular. LUNGS:  No crackles or wheezing audible.  Respirations even and unlabored.  RADIAL PULSE:  Equal bilaterally.   ABDOMEN:  Soft, nontender.  Bowel sounds present and normal.  No audible abdominal bruit.    EXTREMITIES:  Swelling improved.   DP pulses palpable and equal bilaterally.           Assessment & Plan:  PRE OP EVALUATION.  Saw Dr Lady Gary for cardiac clearance.  Per her report, he cleared her for surgery.  Obtain records for Dr America Brown recommendation.  She feels her breathing is stable.  There is concern over possible sleep apnea.  Scheduled for sleep study later this month.  Will need close monitoring of oxygen level and blood pressure during and post procedure.      CARDIOVASCULAR.  See above.  See Dr America Brown recommendations for cardiac clearance.  Will need close intra op and post op monitoring of heart rate and blood pressures to avoid extremes.    EDEMA.  Swelling improved.  Check metabolic panel.     PULMONARY.  Increased snoring and possible witnessed apneic episodes.  Increased fatigue.  Not sleeping well.  Scheduled for a split night sleep study.  See above.     INCREASED PSYCHOSOCIAL STRESSORS.  Doing well.  Follow.    HEALTH MAINTENANCE.  Need to schedule a physical.  She is s/p hysterectomy.  Mammogram 11/28/11 recommended additional views.  11/30/11 follow up mammogram - BiRADS II.

## 2012-08-21 NOTE — Assessment & Plan Note (Signed)
Reflux controlled

## 2012-08-21 NOTE — Assessment & Plan Note (Signed)
Is followed by Dr Gavin Potters.  See his note for details regarding recommendations (for surgery).

## 2012-08-21 NOTE — Telephone Encounter (Signed)
Needs a physical scheduled in 3 months.

## 2012-08-22 ENCOUNTER — Telehealth: Payer: Self-pay | Admitting: Internal Medicine

## 2012-08-22 NOTE — Telephone Encounter (Signed)
I do not mind scheduling the mammo, but if she has a lump on her breast - I need to see her and check her breast.  Will need an appt

## 2012-08-22 NOTE — Telephone Encounter (Signed)
Patient needing her mammogram done . She has a small lump on her left breast.

## 2012-08-22 NOTE — Telephone Encounter (Signed)
Patient has been scheduled

## 2012-08-24 ENCOUNTER — Ambulatory Visit: Payer: Self-pay | Admitting: Unknown Physician Specialty

## 2012-08-24 DIAGNOSIS — G2581 Restless legs syndrome: Secondary | ICD-10-CM | POA: Diagnosis not present

## 2012-08-24 DIAGNOSIS — K219 Gastro-esophageal reflux disease without esophagitis: Secondary | ICD-10-CM | POA: Diagnosis not present

## 2012-08-24 DIAGNOSIS — G5790 Unspecified mononeuropathy of unspecified lower limb: Secondary | ICD-10-CM | POA: Diagnosis not present

## 2012-08-24 DIAGNOSIS — I10 Essential (primary) hypertension: Secondary | ICD-10-CM | POA: Diagnosis not present

## 2012-08-24 DIAGNOSIS — D140 Benign neoplasm of middle ear, nasal cavity and accessory sinuses: Secondary | ICD-10-CM | POA: Diagnosis not present

## 2012-08-24 DIAGNOSIS — J438 Other emphysema: Secondary | ICD-10-CM | POA: Diagnosis not present

## 2012-08-24 DIAGNOSIS — D106 Benign neoplasm of nasopharynx: Secondary | ICD-10-CM | POA: Diagnosis not present

## 2012-08-24 DIAGNOSIS — Z882 Allergy status to sulfonamides status: Secondary | ICD-10-CM | POA: Diagnosis not present

## 2012-08-24 DIAGNOSIS — Z885 Allergy status to narcotic agent status: Secondary | ICD-10-CM | POA: Diagnosis not present

## 2012-08-24 DIAGNOSIS — R059 Cough, unspecified: Secondary | ICD-10-CM | POA: Diagnosis not present

## 2012-08-24 DIAGNOSIS — B977 Papillomavirus as the cause of diseases classified elsewhere: Secondary | ICD-10-CM | POA: Diagnosis not present

## 2012-08-24 DIAGNOSIS — Z881 Allergy status to other antibiotic agents status: Secondary | ICD-10-CM | POA: Diagnosis not present

## 2012-08-24 DIAGNOSIS — M1990 Unspecified osteoarthritis, unspecified site: Secondary | ICD-10-CM | POA: Diagnosis not present

## 2012-08-24 DIAGNOSIS — Z79899 Other long term (current) drug therapy: Secondary | ICD-10-CM | POA: Diagnosis not present

## 2012-08-24 DIAGNOSIS — M069 Rheumatoid arthritis, unspecified: Secondary | ICD-10-CM | POA: Diagnosis not present

## 2012-08-24 DIAGNOSIS — Z888 Allergy status to other drugs, medicaments and biological substances status: Secondary | ICD-10-CM | POA: Diagnosis not present

## 2012-08-24 DIAGNOSIS — Z7982 Long term (current) use of aspirin: Secondary | ICD-10-CM | POA: Diagnosis not present

## 2012-08-24 DIAGNOSIS — D649 Anemia, unspecified: Secondary | ICD-10-CM | POA: Diagnosis not present

## 2012-08-24 DIAGNOSIS — R002 Palpitations: Secondary | ICD-10-CM | POA: Diagnosis not present

## 2012-08-27 NOTE — Telephone Encounter (Signed)
Appointment 08/29/12 @ 8:45.  Pt aware of appointment

## 2012-08-27 NOTE — Telephone Encounter (Signed)
Hey can you see if you can fit patient in? Thank you.

## 2012-08-28 ENCOUNTER — Encounter: Payer: Self-pay | Admitting: Internal Medicine

## 2012-08-29 ENCOUNTER — Other Ambulatory Visit: Payer: Self-pay | Admitting: *Deleted

## 2012-08-29 ENCOUNTER — Ambulatory Visit (INDEPENDENT_AMBULATORY_CARE_PROVIDER_SITE_OTHER): Payer: Medicare Other | Admitting: Internal Medicine

## 2012-08-29 ENCOUNTER — Encounter: Payer: Self-pay | Admitting: Internal Medicine

## 2012-08-29 VITALS — BP 124/70 | HR 70 | Temp 98.4°F | Ht 65.0 in | Wt 162.5 lb

## 2012-08-29 DIAGNOSIS — R928 Other abnormal and inconclusive findings on diagnostic imaging of breast: Secondary | ICD-10-CM | POA: Diagnosis not present

## 2012-08-29 DIAGNOSIS — I10 Essential (primary) hypertension: Secondary | ICD-10-CM | POA: Diagnosis not present

## 2012-08-29 DIAGNOSIS — N63 Unspecified lump in unspecified breast: Secondary | ICD-10-CM

## 2012-08-29 MED ORDER — ESOMEPRAZOLE MAGNESIUM 40 MG PO CPDR: 40.0000 mg | DELAYED_RELEASE_CAPSULE | Freq: Two times a day (BID) | ORAL | Status: DC

## 2012-08-29 MED ORDER — POTASSIUM CHLORIDE CRYS ER 20 MEQ PO TBCR: 20.0000 meq | EXTENDED_RELEASE_TABLET | Freq: Every day | ORAL | Status: DC

## 2012-08-29 NOTE — Telephone Encounter (Signed)
Sent in to pharmacy.  

## 2012-08-30 ENCOUNTER — Ambulatory Visit: Payer: Self-pay | Admitting: Internal Medicine

## 2012-08-30 DIAGNOSIS — R928 Other abnormal and inconclusive findings on diagnostic imaging of breast: Secondary | ICD-10-CM | POA: Diagnosis not present

## 2012-08-30 DIAGNOSIS — N63 Unspecified lump in unspecified breast: Secondary | ICD-10-CM | POA: Diagnosis not present

## 2012-09-01 ENCOUNTER — Telehealth: Payer: Self-pay | Admitting: Internal Medicine

## 2012-09-01 DIAGNOSIS — N63 Unspecified lump in unspecified breast: Secondary | ICD-10-CM

## 2012-09-01 NOTE — Telephone Encounter (Signed)
Pt notified of mammo results.  Since palpable nodule - will refer to Dr Lemar Livings to confirm no biopsy needed.  I placed order for referral.  mammo on your desk.  Will need to be scanned after sent.  Thanks.

## 2012-09-02 ENCOUNTER — Encounter: Payer: Self-pay | Admitting: Internal Medicine

## 2012-09-02 NOTE — Progress Notes (Signed)
Subjective:    Patient ID: Kaitlyn Huff, female    DOB: 1941/07/05, 72 y.o.   MRN: [id number]  HPI 72 year old female with past history of hypertension, hypercholesterolemia, GERD, depression and rheumatoid arthritis who comes in today as a work in with concerns regarding a palpable nodule in her left breast/left anterior chest.   Has been there for a while, but she feels in has gotten bigger.   No nipple discharge.  No tenderness.  Enlarging and she was concerned.  No other nodules palpated.      Past Medical History  Diagnosis Date  . Rheumatoid arthritis     MTX transaminitis, Leflunomide (rash), enbrel, plaquinil, prednisone, remicade, Imuran (transaminitis)  . Hyperlipidemia   . Diverticulitis   . Osteoarthritis   . GERD (gastroesophageal reflux disease)   . Hypertension   . Anemia   . Positive PPD     s/p INH (2006)  . Osteoporosis     actonel  . 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 aspirin 81 MG tablet Take 81 mg by mouth daily.      . Azelastine-Fluticasone (DYMISTA) 137-50 MCG/ACT SUSP Place into the nose.      . calcium-vitamin D (OSCAL 500/200 D-3) 500-200 MG-UNIT per tablet Take 1 tablet by mouth 2 (two) times daily.      . cetirizine (ZYRTEC) 10 MG tablet Take 10 mg by mouth daily.      Marland Kitchen FeFum-FePo-FA-B Cmp-C-Zn-Mn-Cu (TANDEM PLUS PO) Take by mouth daily.      . Fluticasone-Salmeterol (ADVAIR) 250-50 MCG/DOSE AEPB Inhale 1 puff into the lungs every 12 (twelve) hours.      . furosemide (LASIX) 20 MG tablet Take 1 tablet (20 mg total) by mouth daily.  90 tablet  0  . gabapentin (NEURONTIN) 300 MG capsule Take 300 mg by mouth 4 (four) times daily.      . hydroxychloroquine (PLAQUENIL) 200 MG tablet Take by mouth 2 (two) times daily.      . InFLIXimab (REMICADE IV) Inject every 6 weeks      . metolazone (ZAROXOLYN) 5 MG tablet Take 5 mg by mouth daily.      .  metoprolol succinate (TOPROL-XL) 50 MG 24 hr tablet Take 50 mg by mouth daily. Take with or immediately following a meal.      . Multiple Vitamin (MULTIVITAMIN) capsule Take 1 capsule by mouth daily.      . prednisoLONE 5 MG TABS Take by mouth daily.      . risedronate (ACTONEL) 150 MG tablet Take 150 mg by mouth every 30 (thirty) days. with water on empty stomach, nothing by mouth or lie down for next 30 minutes.      . simvastatin (ZOCOR) 20 MG tablet Take 20 mg by mouth every evening.       No current facility-administered medications on file prior to visit.    Review of Systems Patient did not report any headache, lightheadedness or dizziness.  No significant sinus or allergy symptoms.  No chest pain or tightness.  No palpitations reported now.  She does report the nodule as outlined.  No tenderness.  No other change noted in the breast.           Objective:   Physical Exam  Filed Vitals:   08/29/12 0857  BP: 124/70  Pulse: 70  Temp: 98.4 F (36.9  C)   72 year old female in no acute distress.  NECK:  Supple.  Nontender.  No audible bruit.  HEART:  Appears to be regular. LUNGS:  No crackles or wheezing audible.  Respirations even and unlabored.  RADIAL PULSE:  Equal bilaterally.    BREASTS:  No nipple discharge or nipple retraction present.  No axillary adenopathy.  Palpable nodule 10 o'clock region - left breast / anterior chest.  Non tender.       Assessment & Plan:  BREAST NODULE.  See above.  Will schedule a diagnostic left breast mammo and ultraound.  Further w/up pending.  May need referral to surgery.        PULMONARY.  Increased snoring and possible witnessed apneic episodes.  See last note.  Scheduled for a split night sleep study.       INCREASED PSYCHOSOCIAL STRESSORS.  Doing well.  Follow.    HEALTH MAINTENANCE.  Should be scheduled for a physical.   She is s/p hysterectomy.  Mammogram 11/28/11 recommended additional views.  11/30/11 follow up mammogram - BiRADS II.   See above.

## 2012-09-02 NOTE — Assessment & Plan Note (Signed)
Blood pressure is doing well.  Same medication regimen.  Follow metabolic panel.  

## 2012-09-06 DIAGNOSIS — L03039 Cellulitis of unspecified toe: Secondary | ICD-10-CM | POA: Diagnosis not present

## 2012-09-06 DIAGNOSIS — M79609 Pain in unspecified limb: Secondary | ICD-10-CM | POA: Diagnosis not present

## 2012-09-06 DIAGNOSIS — M069 Rheumatoid arthritis, unspecified: Secondary | ICD-10-CM | POA: Diagnosis not present

## 2012-09-12 ENCOUNTER — Encounter: Payer: Self-pay | Admitting: Internal Medicine

## 2012-09-20 DIAGNOSIS — L03039 Cellulitis of unspecified toe: Secondary | ICD-10-CM | POA: Diagnosis not present

## 2012-09-26 ENCOUNTER — Other Ambulatory Visit: Payer: Self-pay | Admitting: *Deleted

## 2012-09-26 MED ORDER — FLUTICASONE-SALMETEROL 250-50 MCG/DOSE IN AEPB: 1.0000 | INHALATION_SPRAY | Freq: Two times a day (BID) | RESPIRATORY_TRACT | Status: DC

## 2012-09-26 NOTE — Telephone Encounter (Signed)
Sent in to pharmacy.  

## 2012-09-27 DIAGNOSIS — D106 Benign neoplasm of nasopharynx: Secondary | ICD-10-CM | POA: Diagnosis not present

## 2012-09-27 DIAGNOSIS — B977 Papillomavirus as the cause of diseases classified elsewhere: Secondary | ICD-10-CM | POA: Diagnosis not present

## 2012-09-28 ENCOUNTER — Other Ambulatory Visit: Payer: Self-pay | Admitting: *Deleted

## 2012-10-01 ENCOUNTER — Encounter: Payer: Self-pay | Admitting: Internal Medicine

## 2012-10-01 DIAGNOSIS — Z79899 Other long term (current) drug therapy: Secondary | ICD-10-CM | POA: Diagnosis not present

## 2012-10-01 MED ORDER — CETIRIZINE HCL 10 MG PO TABS: 10.0000 mg | ORAL_TABLET | Freq: Every day | ORAL | Status: DC

## 2012-10-01 NOTE — Telephone Encounter (Signed)
Sent in to pharmacy.  

## 2012-10-02 DIAGNOSIS — M659 Synovitis and tenosynovitis, unspecified: Secondary | ICD-10-CM | POA: Diagnosis not present

## 2012-10-02 DIAGNOSIS — M069 Rheumatoid arthritis, unspecified: Secondary | ICD-10-CM | POA: Diagnosis not present

## 2012-10-16 ENCOUNTER — Telehealth: Payer: Self-pay | Admitting: Internal Medicine

## 2012-10-16 MED ORDER — TANDEM PLUS 162-115.2-1 MG PO CAPS: 1.0000 mg | ORAL_CAPSULE | Freq: Every day | ORAL | Status: DC

## 2012-10-16 NOTE — Telephone Encounter (Signed)
Rx sent in to pharmacy. 

## 2012-10-16 NOTE — Telephone Encounter (Signed)
FeFum-FePo-FA-B Cmp-C-Zn-Mn-Cu (TANDEM PLUS PO) 106/1 mg Take one tablet by mouth daily.  #90

## 2012-10-25 ENCOUNTER — Ambulatory Visit: Payer: Self-pay | Admitting: Internal Medicine

## 2012-10-25 DIAGNOSIS — G4733 Obstructive sleep apnea (adult) (pediatric): Secondary | ICD-10-CM | POA: Diagnosis not present

## 2012-10-25 DIAGNOSIS — R0609 Other forms of dyspnea: Secondary | ICD-10-CM | POA: Diagnosis not present

## 2012-10-25 DIAGNOSIS — G4700 Insomnia, unspecified: Secondary | ICD-10-CM | POA: Diagnosis not present

## 2012-11-08 DIAGNOSIS — D140 Benign neoplasm of middle ear, nasal cavity and accessory sinuses: Secondary | ICD-10-CM | POA: Diagnosis not present

## 2012-11-08 DIAGNOSIS — R040 Epistaxis: Secondary | ICD-10-CM | POA: Diagnosis not present

## 2012-11-13 DIAGNOSIS — M069 Rheumatoid arthritis, unspecified: Secondary | ICD-10-CM | POA: Diagnosis not present

## 2012-11-14 ENCOUNTER — Other Ambulatory Visit: Payer: Self-pay | Admitting: Internal Medicine

## 2012-11-14 MED ORDER — FUROSEMIDE 20 MG PO TABS: 20.0000 mg | ORAL_TABLET | Freq: Every day | ORAL | Status: DC

## 2012-11-14 NOTE — Progress Notes (Signed)
Refilled furosemide 20mg  #90 with one refill.  [facility] [address]

## 2012-11-20 ENCOUNTER — Ambulatory Visit (INDEPENDENT_AMBULATORY_CARE_PROVIDER_SITE_OTHER): Payer: Medicare Other | Admitting: Internal Medicine

## 2012-11-20 ENCOUNTER — Encounter: Payer: Self-pay | Admitting: Internal Medicine

## 2012-11-20 ENCOUNTER — Other Ambulatory Visit (HOSPITAL_COMMUNITY)
Admission: RE | Admit: 2012-11-20 | Discharge: 2012-11-20 | Disposition: A | Discharge status: Home / Self Care | Payer: Medicare Other | Source: Ambulatory Visit | Attending: Internal Medicine | Admitting: Internal Medicine

## 2012-11-20 VITALS — BP 126/80 | HR 67 | Temp 98.6°F | Ht 64.2 in | Wt 174.0 lb

## 2012-11-20 DIAGNOSIS — Z124 Encounter for screening for malignant neoplasm of cervix: Secondary | ICD-10-CM

## 2012-11-20 DIAGNOSIS — I10 Essential (primary) hypertension: Secondary | ICD-10-CM | POA: Diagnosis not present

## 2012-11-20 DIAGNOSIS — E7800 Pure hypercholesterolemia, unspecified: Secondary | ICD-10-CM

## 2012-11-20 DIAGNOSIS — K219 Gastro-esophageal reflux disease without esophagitis: Secondary | ICD-10-CM

## 2012-11-20 DIAGNOSIS — G4700 Insomnia, unspecified: Secondary | ICD-10-CM

## 2012-11-20 DIAGNOSIS — Z01419 Encounter for gynecological examination (general) (routine) without abnormal findings: Secondary | ICD-10-CM | POA: Insufficient documentation

## 2012-11-20 DIAGNOSIS — Z1151 Encounter for screening for human papillomavirus (HPV): Secondary | ICD-10-CM | POA: Insufficient documentation

## 2012-11-20 DIAGNOSIS — D649 Anemia, unspecified: Secondary | ICD-10-CM

## 2012-11-20 DIAGNOSIS — M069 Rheumatoid arthritis, unspecified: Secondary | ICD-10-CM

## 2012-11-20 LAB — HEPATIC FUNCTION PANEL
Alkaline Phosphatase: 46 U/L (ref 39–117)
Bilirubin, Direct: 0.1 mg/dL (ref 0.0–0.3)
Total Bilirubin: 0.5 mg/dL (ref 0.3–1.2)

## 2012-11-20 LAB — BASIC METABOLIC PANEL
BUN: 7 mg/dL (ref 6–23)
Chloride: 100 mEq/L (ref 96–112)
Creatinine, Ser: 1.1 mg/dL (ref 0.4–1.2)
GFR: 52.81 mL/min — ABNORMAL LOW (ref >60.00)
Glucose, Bld: 89 mg/dL (ref 70–99)
Potassium: 3.8 mEq/L (ref 3.5–5.1)

## 2012-11-20 LAB — LIPID PANEL
LDL Cholesterol: 47 mg/dL (ref 0–99)
VLDL: 31.4 mg/dL (ref 0.0–40.0)

## 2012-11-20 LAB — TSH: TSH: 2.31 u[IU]/mL (ref 0.35–5.50)

## 2012-11-20 NOTE — Progress Notes (Signed)
Subjective:    Patient ID: Kaitlyn Huff, female    DOB: 10-11-1940, 72 y.o.   MRN: [id number]  HPI 71 year old female with past history of hypertension, hypercholesterolemia, GERD, depression and rheumatoid arthritis who comes in today to follow up on these issues as well as for a complete physical exam.   States she is having issues with not sleeping.  Increased stress and some depression in dealing with her husbands death.  Breathing stable.  No chest pain or tightness.  Had a sleep study.  Need results.  Joints stable.  Saw Dr Lemar Livings for her breast.  Lipoma on chest wall.      Past Medical History  Diagnosis Date  . Rheumatoid arthritis     MTX transaminitis, Leflunomide (rash), enbrel, plaquinil, prednisone, remicade, Imuran (transaminitis)  . Hyperlipidemia   . Diverticulitis   . Osteoarthritis   . GERD (gastroesophageal reflux disease)   . Hypertension   . Anemia   . Positive PPD     s/p INH (2006)  . Osteoporosis     actonel  . 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 aspirin 81 MG tablet Take 81 mg by mouth daily.      . calcium-vitamin D (OSCAL 500/200 D-3) 500-200 MG-UNIT per tablet Take 1 tablet by mouth 2 (two) times daily.      . cetirizine (ZYRTEC) 10 MG tablet Take 1 tablet (10 mg total) by mouth daily.  90 tablet  1  . esomeprazole (NEXIUM) 40 MG capsule Take 1 capsule (40 mg total) by mouth 2 (two) times daily.  180 capsule  1  . FeFum-FePo-FA-B Cmp-C-Zn-Mn-Cu (TANDEM PLUS) 162-115.2-1 MG CAPS Take 1 mg by mouth daily.  30 each  5  . Fluticasone-Salmeterol (ADVAIR) 250-50 MCG/DOSE AEPB Inhale 1 puff into the lungs every 12 (twelve) hours.  60 each  5  . furosemide (LASIX) 20 MG tablet Take 1 tablet (20 mg total) by mouth daily.  90 tablet  1  . gabapentin (NEURONTIN) 300 MG capsule Take 300 mg by mouth 4 (four) times daily.      . hydroxychloroquine  (PLAQUENIL) 200 MG tablet Take by mouth 2 (two) times daily.      . InFLIXimab (REMICADE IV) Inject every 6 weeks      . metolazone (ZAROXOLYN) 5 MG tablet Take 5 mg by mouth daily.      . metoprolol succinate (TOPROL-XL) 50 MG 24 hr tablet Take 50 mg by mouth daily. Take with or immediately following a meal.      . Multiple Vitamin (MULTIVITAMIN) capsule Take 1 capsule by mouth daily.      . potassium chloride SA (K-DUR,KLOR-CON) 20 MEQ tablet Take 1 tablet (20 mEq total) by mouth daily.  90 tablet  1  . prednisoLONE 5 MG TABS Take by mouth daily.      . risedronate (ACTONEL) 150 MG tablet Take 150 mg by mouth every 30 (thirty) days. with water on empty stomach, nothing by mouth or lie down for next 30 minutes.      . simvastatin (ZOCOR) 20 MG tablet Take 20 mg by mouth every evening.      . Azelastine-Fluticasone (DYMISTA) 137-50 MCG/ACT SUSP Place into the nose.      Marland Kitchen gentamicin cream (GARAMYCIN) 0.1 % Apply topically 3 (three) times daily.       No current  facility-administered medications on file prior to visit.    Review of Systems Patient did not report any headache, lightheadedness or dizziness.  No significant sinus or allergy symptoms.  No chest pain or tightness.  No palpitations reported now.  Breathing stable.  Saw Dr Lemar Livings.  Felt to have a lipoma - chest wall. Everything checked out fine.  No nausea or vomiting.  No bowel change. Increased stress.  No sleeping.  Had sleep study.  Need results.        Objective:   Physical Exam  Filed Vitals:   11/20/12 0802  BP: 126/80  Pulse: 67  Temp: 98.6 F (37 C)   Blood pressure recheck:  62/49  72 year old female in no acute distress.   HEENT:  Nares- clear.  Oropharynx - without lesions. NECK:  Supple.  Nontender.  No audible bruit.  HEART:  Appears to be regular. LUNGS:  No crackles or wheezing audible.  Respirations even and unlabored.  RADIAL PULSE:  Equal bilaterally.    BREASTS:  No nipple discharge or nipple  retraction present.  Could not appreciate any distinct nodules or axillary adenopathy.  ABDOMEN:  Soft, nontender.  Bowel sounds present and normal.  No audible abdominal bruit.  GU:  Normal external genitalia.  Vaginal vault without lesions.  S/p hysterectomy.  Pap performed. Could not appreciate any adnexal masses or tenderness.   RECTAL:  Heme negative.   EXTREMITIES:  No increased edema present.  DP pulses palpable and equal bilaterally.           Assessment & Plan:  BREAST NODULE.  Saw Dr Lemar Livings.  Lipoma.  No further w/up warranted.     DIFFICULTY SLEEPING.  Discussed multiple treatment options.  Discussed the possibility of trazodone.  Will hold on prescribing until obtain results of the sleep study.         PULMONARY.  Had a split night sleep study.  Need results.  Avoid sedating medication.  Avoid sleeping supine.       INCREASED PSYCHOSOCIAL STRESSORS.  Feels she is handling things relatively well.  Desires no counseling.  Follow.    HEALTH MAINTENANCE.  Physical today.  She is s/p hysterectomy.  Pap today. Mammogram 11/28/11 recommended additional views.  11/30/11 follow up mammogram - BiRADS II.  Mammogram 2/14 - Birads III.  Referred to Dr Lemar Livings.  See above.

## 2012-11-21 ENCOUNTER — Other Ambulatory Visit: Payer: Self-pay | Admitting: Internal Medicine

## 2012-11-21 ENCOUNTER — Telehealth: Payer: Self-pay | Admitting: Internal Medicine

## 2012-11-21 ENCOUNTER — Encounter: Payer: Self-pay | Admitting: *Deleted

## 2012-11-21 DIAGNOSIS — N289 Disorder of kidney and ureter, unspecified: Secondary | ICD-10-CM

## 2012-11-21 DIAGNOSIS — R748 Abnormal levels of other serum enzymes: Secondary | ICD-10-CM

## 2012-11-21 NOTE — Telephone Encounter (Signed)
Pt wanted to let you know that she forgot to take her Actonel on the 1st of May. Wants to know if it is okay to take it the 1st of June? Please advise Pt was called with lab results & scheduled a 1wk f/u lab. Also received her S/S records. Pt informed that I will call her this afternoon with results

## 2012-11-21 NOTE — Telephone Encounter (Signed)
Left message on voicemail (per pt request) to inform pt that her S/S was positive for sleep apnea. I left a message with Misty Stanley at [facility] to fax order.

## 2012-11-21 NOTE — Telephone Encounter (Signed)
S/S received and placed in your folder

## 2012-11-21 NOTE — Telephone Encounter (Signed)
Yes, it is ok for her to take her actonel on 12/09/12.  Regarding her sleep study - she does have sleep apnea and needs CPAP - 11 set up.  See sleep study in your box.

## 2012-11-21 NOTE — Telephone Encounter (Signed)
Need sleep study results.  Had done in April  Thanks.

## 2012-11-21 NOTE — Progress Notes (Signed)
Orders placed for f/u labs.  

## 2012-11-21 NOTE — Telephone Encounter (Signed)
duplicate

## 2012-11-22 ENCOUNTER — Encounter: Payer: Self-pay | Admitting: Internal Medicine

## 2012-11-22 NOTE — Assessment & Plan Note (Signed)
Blood pressure is doing well.  Same medication regimen.  Follow metabolic panel.  

## 2012-11-22 NOTE — Assessment & Plan Note (Signed)
Reflux controlled

## 2012-11-22 NOTE — Assessment & Plan Note (Signed)
Is followed by Dr Gavin Potters.  See his note for details.  Stable.

## 2012-11-22 NOTE — Assessment & Plan Note (Signed)
Low cholesterol diet.  On simvastatin.  Follow lipid profile and liver panel.   

## 2012-11-22 NOTE — Assessment & Plan Note (Signed)
Follow cbc.  

## 2012-11-26 ENCOUNTER — Encounter: Payer: Self-pay | Admitting: *Deleted

## 2012-11-28 ENCOUNTER — Other Ambulatory Visit (INDEPENDENT_AMBULATORY_CARE_PROVIDER_SITE_OTHER): Payer: Medicare Other

## 2012-11-28 DIAGNOSIS — N289 Disorder of kidney and ureter, unspecified: Secondary | ICD-10-CM | POA: Diagnosis not present

## 2012-11-28 DIAGNOSIS — R748 Abnormal levels of other serum enzymes: Secondary | ICD-10-CM | POA: Diagnosis not present

## 2012-11-28 LAB — BASIC METABOLIC PANEL
CO2: 28 mEq/L (ref 19–32)
Chloride: 99 mEq/L (ref 96–112)
Glucose, Bld: 114 mg/dL — ABNORMAL HIGH (ref 70–99)
Potassium: 4 mEq/L (ref 3.5–5.1)
Sodium: 138 mEq/L (ref 135–145)

## 2012-11-28 LAB — HEPATIC FUNCTION PANEL
ALT: 49 U/L — ABNORMAL HIGH (ref 0–35)
AST: 52 U/L — ABNORMAL HIGH (ref 0–37)
Albumin: 3.9 g/dL (ref 3.5–5.2)
Alkaline Phosphatase: 46 U/L (ref 39–117)
Bilirubin, Direct: 0 mg/dL (ref 0.0–0.3)
Total Protein: 7.4 g/dL (ref 6.0–8.3)

## 2012-11-29 ENCOUNTER — Other Ambulatory Visit: Payer: Self-pay | Admitting: Internal Medicine

## 2012-11-29 DIAGNOSIS — R748 Abnormal levels of other serum enzymes: Secondary | ICD-10-CM

## 2012-11-29 DIAGNOSIS — R945 Abnormal results of liver function studies: Secondary | ICD-10-CM

## 2012-12-04 ENCOUNTER — Other Ambulatory Visit: Payer: Self-pay | Admitting: Rheumatology

## 2012-12-04 ENCOUNTER — Encounter: Payer: Self-pay | Admitting: Internal Medicine

## 2012-12-04 ENCOUNTER — Telehealth: Payer: Self-pay | Admitting: Internal Medicine

## 2012-12-04 DIAGNOSIS — M069 Rheumatoid arthritis, unspecified: Secondary | ICD-10-CM | POA: Diagnosis not present

## 2012-12-04 DIAGNOSIS — M25569 Pain in unspecified knee: Secondary | ICD-10-CM | POA: Diagnosis not present

## 2012-12-04 LAB — SYNOVIAL CELL COUNT + DIFF, W/ CRYSTALS
Basophil: 0 %
Crystals, Joint Fluid: NONE SEEN
Eosinophil: 0 %
Lymphocytes: 4 %
Neutrophils: 49 %
Nucleated Cell Count: 17836 /mm 3
Other Cells BF: 0 %
Other Mononuclear Cells: 47 %

## 2012-12-11 ENCOUNTER — Ambulatory Visit: Payer: Self-pay | Admitting: Internal Medicine

## 2012-12-11 DIAGNOSIS — R7989 Other specified abnormal findings of blood chemistry: Secondary | ICD-10-CM | POA: Diagnosis not present

## 2012-12-11 DIAGNOSIS — R935 Abnormal findings on diagnostic imaging of other abdominal regions, including retroperitoneum: Secondary | ICD-10-CM | POA: Diagnosis not present

## 2012-12-18 ENCOUNTER — Telehealth: Payer: Self-pay | Admitting: Emergency Medicine

## 2012-12-18 NOTE — Telephone Encounter (Signed)
See if pt can come in at 2:00 on Friday 12/21/12 - block .  Also block the 4:15 spot.  Thanks.  I need to talk to her about her sleep study

## 2012-12-18 NOTE — Telephone Encounter (Signed)
Patient has an apt this afternoon with [facility] ( this was her 2nd apt ) for BiPap machine and supplies. Pt called this morning and cancelled stating she needed to talk to [place] again, as she did the last time. Misty Stanley from [facility] states the patient has severe apnea and stop breathing 48x/hour. This is really serious but the patient is not grasping what is going on. Please advise. Thanks.

## 2012-12-19 ENCOUNTER — Telehealth: Payer: Self-pay | Admitting: *Deleted

## 2012-12-19 DIAGNOSIS — K838 Other specified diseases of biliary tract: Secondary | ICD-10-CM

## 2012-12-19 DIAGNOSIS — K760 Fatty (change of) liver, not elsewhere classified: Secondary | ICD-10-CM

## 2012-12-19 NOTE — Telephone Encounter (Signed)
Pt aware of appt.

## 2012-12-19 NOTE — Telephone Encounter (Signed)
Spoke with pt regarding her Abdominal U/S (12/11/12)-Notified her that the U/S revealed a fatty liver and a dilated common bile duct. Needs further eval & a referral to GI for work-up. Pt is agreeable for referral. Pt has an appt on Friday 6/13 @ 2pm to discuss sleep study. She would also like to discuss U/S a little more & ask about getting a smaller pill in replace of her current Potassium pills if possible.  (Has lab appt on 6/23 to repeat liver panel also)**Needs GI referral**

## 2012-12-20 NOTE — Telephone Encounter (Signed)
Order placed for GI referral.  Will discuss with her at her appt tomorrow.  Also will discuss changing potassium pills.

## 2012-12-21 ENCOUNTER — Ambulatory Visit (INDEPENDENT_AMBULATORY_CARE_PROVIDER_SITE_OTHER): Payer: Medicare Other | Admitting: Internal Medicine

## 2012-12-21 ENCOUNTER — Encounter: Payer: Self-pay | Admitting: Internal Medicine

## 2012-12-21 VITALS — BP 120/80 | HR 73 | Temp 98.0°F | Ht 64.2 in | Wt 172.2 lb

## 2012-12-21 DIAGNOSIS — K219 Gastro-esophageal reflux disease without esophagitis: Secondary | ICD-10-CM

## 2012-12-21 DIAGNOSIS — G4733 Obstructive sleep apnea (adult) (pediatric): Secondary | ICD-10-CM

## 2012-12-21 DIAGNOSIS — E7800 Pure hypercholesterolemia, unspecified: Secondary | ICD-10-CM

## 2012-12-21 DIAGNOSIS — M069 Rheumatoid arthritis, unspecified: Secondary | ICD-10-CM

## 2012-12-21 DIAGNOSIS — I10 Essential (primary) hypertension: Secondary | ICD-10-CM

## 2012-12-21 DIAGNOSIS — K838 Other specified diseases of biliary tract: Secondary | ICD-10-CM

## 2012-12-21 DIAGNOSIS — D649 Anemia, unspecified: Secondary | ICD-10-CM

## 2012-12-21 MED ORDER — POTASSIUM CHLORIDE ER 10 MEQ PO CPCR: ORAL_CAPSULE | ORAL | Status: DC

## 2012-12-23 ENCOUNTER — Encounter: Payer: Self-pay | Admitting: Internal Medicine

## 2012-12-23 DIAGNOSIS — K838 Other specified diseases of biliary tract: Secondary | ICD-10-CM | POA: Insufficient documentation

## 2012-12-23 DIAGNOSIS — G4733 Obstructive sleep apnea (adult) (pediatric): Secondary | ICD-10-CM | POA: Insufficient documentation

## 2012-12-23 NOTE — Assessment & Plan Note (Signed)
Reflux controlled

## 2012-12-23 NOTE — Assessment & Plan Note (Signed)
Low cholesterol diet.  On simvastatin.  Follow lipid profile and liver panel.   

## 2012-12-23 NOTE — Assessment & Plan Note (Signed)
Is followed by Dr Kernodle.  Stable.   

## 2012-12-23 NOTE — Progress Notes (Signed)
Subjective:    Patient ID: Kaitlyn Huff, female    DOB: July 24, 1940, 72 y.o.   MRN: [id number]  HPI 72 year old female with past history of hypertension, hypercholesterolemia, GERD, depression and rheumatoid arthritis who comes in today for a scheduled follow up.   States she is having issues with not sleeping.  Increased stress and some depression in dealing with her husbands death.  Breathing stable.  No chest pain or tightness.  Had a sleep study.  Revealed severe sleep apnea.  Discussed with her today the need for wearing CPAP.  Will contact them regarding being fitted for a mask.  Joints stable.  Still seeing Dr Gavin Potters.  Saw Dr Lemar Livings for her breast.  Lipoma on chest wall.  She states she needs smaller potassium pills.  Has no problems swallowing any other meds.  She was found to have abnormal liver function.  Abdominal ultrasound revealed an abnormal dilatation of the common bile duct up to 8.18mm and changes that appeared to c/w extreme fatty infiltration.  She is already scheduled to see Fransico Setters next week.  Will forward this information as well.  Will need further w/up.     Past Medical History  Diagnosis Date  . Rheumatoid arthritis(714.0)     MTX transaminitis, Leflunomide (rash), enbrel, plaquinil, prednisone, remicade, Imuran (transaminitis)  . Hyperlipidemia   . Diverticulitis   . Osteoarthritis   . GERD (gastroesophageal reflux disease)   . Hypertension   . Anemia   . Positive PPD     s/p INH (2006)  . Osteoporosis     actonel  . 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 aspirin 81 MG tablet Take 81 mg by mouth daily.      . Azelastine-Fluticasone (DYMISTA) 137-50 MCG/ACT SUSP Place into the nose as needed.       . calcium-vitamin D (OSCAL 500/200 D-3) 500-200 MG-UNIT per tablet Take 1 tablet by mouth 2 (two) times daily.      . cetirizine (ZYRTEC) 10 MG tablet Take  1 tablet (10 mg total) by mouth daily.  90 tablet  1  . esomeprazole (NEXIUM) 40 MG capsule Take 1 capsule (40 mg total) by mouth 2 (two) times daily.  180 capsule  1  . FeFum-FePo-FA-B Cmp-C-Zn-Mn-Cu (TANDEM PLUS) 162-115.2-1 MG CAPS Take 1 mg by mouth daily.  30 each  5  . Fluticasone-Salmeterol (ADVAIR) 250-50 MCG/DOSE AEPB Inhale 1 puff into the lungs every 12 (twelve) hours.  60 each  5  . furosemide (LASIX) 20 MG tablet Take 1 tablet (20 mg total) by mouth daily.  90 tablet  1  . gabapentin (NEURONTIN) 300 MG capsule Take 300 mg by mouth 4 (four) times daily.      . hydroxychloroquine (PLAQUENIL) 200 MG tablet Take by mouth 2 (two) times daily.      . InFLIXimab (REMICADE IV) Inject every 6 weeks      . metolazone (ZAROXOLYN) 5 MG tablet Take 5 mg by mouth daily.      . metoprolol succinate (TOPROL-XL) 50 MG 24 hr tablet Take 50 mg by mouth daily. Take with or immediately following a meal.      . Multiple Vitamin (MULTIVITAMIN) capsule Take 1 capsule by mouth daily.      . prednisoLONE 5 MG TABS Take by mouth daily.      . risedronate (ACTONEL) 150  MG tablet Take 150 mg by mouth every 30 (thirty) days. with water on empty stomach, nothing by mouth or lie down for next 30 minutes.      . simvastatin (ZOCOR) 20 MG tablet Take 20 mg by mouth every evening.      Marland Kitchen gentamicin cream (GARAMYCIN) 0.1 % Apply topically 3 (three) times daily.       No current facility-administered medications on file prior to visit.    Review of Systems Patient did not report any headache, lightheadedness or dizziness.  No significant sinus or allergy symptoms.  No chest pain or tightness.  No palpitations reported now.  Breathing stable.  Saw Dr Lemar Livings.  Felt to have a lipoma - chest wall. Everything checked out fine.  No nausea or vomiting.  No bowel change.  Increased stress.  No sleeping.  Had sleep study.  Results as outlined.  Needs to f/u with CPAP.  Discussed with her today.  Discussed abdominal ultrasound  results with her.         Objective:   Physical Exam  Filed Vitals:   12/21/12 1404  BP: 120/80  Pulse: 73  Temp: 98 F (81.54 C)   72 year old female in no acute distress.   HEENT:  Nares- clear.  Oropharynx - without lesions. NECK:  Supple.  Nontender.  No audible bruit.  HEART:  Appears to be regular. LUNGS:  No crackles or wheezing audible.  Respirations even and unlabored.  RADIAL PULSE:  Equal bilaterally.   ABDOMEN:  Soft, nontender.  Bowel sounds present and normal.  No audible abdominal bruit.    EXTREMITIES:  No increased edema present.  DP pulses palpable and equal bilaterally.           Assessment & Plan:  BREAST NODULE.  Saw Dr Lemar Livings.  Lipoma.  No further w/up warranted.     DIFFICULTY SLEEPING.  Discussed multiple treatment options.  Discussed the possibility of trazodone.  Will hold on prescribing until treatment for her sleep apnea.         PULMONARY.  Had a split night sleep study.  Results as outlined.  Avoid sedating medication.  Avoid sleeping supine.  Needs CPAP.  Will contact them regarding proceeding with getting her CPAP/     INCREASED PSYCHOSOCIAL STRESSORS.  Feels she is handling things relatively well.  Desires no counseling.  Follow.   CHANGE OF MEDICATION.  She request to have a smaller potassium pill.  Will change to Micro K as directed.    HEALTH MAINTENANCE.  Physical last visit.  She is s/p hysterectomy.   Mammogram 11/28/11 recommended additional views.  11/30/11 follow up mammogram - BiRADS II.  Mammogram 2/14 - Birads III.  Referred to Dr Lemar Livings.  See above.

## 2012-12-23 NOTE — Assessment & Plan Note (Signed)
Per sleep study - has severe sleep apnea.  Planning to discuss inititation of CPAP.

## 2012-12-23 NOTE — Assessment & Plan Note (Signed)
Was found to have a dilated common bile duct on recent abdominal ultrasound.  Refer to GI for further w/up.

## 2012-12-23 NOTE — Assessment & Plan Note (Signed)
Blood pressure is doing well.  Same medication regimen.  Follow metabolic panel.  

## 2012-12-23 NOTE — Assessment & Plan Note (Signed)
Follow cbc.  

## 2012-12-25 DIAGNOSIS — M069 Rheumatoid arthritis, unspecified: Secondary | ICD-10-CM | POA: Diagnosis not present

## 2012-12-25 DIAGNOSIS — K7689 Other specified diseases of liver: Secondary | ICD-10-CM | POA: Diagnosis not present

## 2012-12-27 DIAGNOSIS — D649 Anemia, unspecified: Secondary | ICD-10-CM | POA: Diagnosis not present

## 2012-12-31 ENCOUNTER — Other Ambulatory Visit: Payer: Medicare Other

## 2013-01-02 ENCOUNTER — Other Ambulatory Visit: Payer: Self-pay | Admitting: Internal Medicine

## 2013-01-02 DIAGNOSIS — E871 Hypo-osmolality and hyponatremia: Secondary | ICD-10-CM

## 2013-01-02 NOTE — Progress Notes (Signed)
Order placed for f/u sodium - per kim mills

## 2013-01-03 ENCOUNTER — Encounter: Payer: Self-pay | Admitting: Internal Medicine

## 2013-01-03 ENCOUNTER — Telehealth: Payer: Self-pay | Admitting: Internal Medicine

## 2013-01-03 NOTE — Telephone Encounter (Signed)
Pt missed her lab appointment last week.  She stated she had labs done for Fransico Setters and pt wanted to know if she still needed to come in for her labs/  Please advise

## 2013-01-04 DIAGNOSIS — I059 Rheumatic mitral valve disease, unspecified: Secondary | ICD-10-CM | POA: Diagnosis not present

## 2013-01-04 DIAGNOSIS — I10 Essential (primary) hypertension: Secondary | ICD-10-CM | POA: Diagnosis not present

## 2013-01-04 NOTE — Telephone Encounter (Signed)
LMTCB-Dr. Lorin Picket reviewed labs from Kaitlyn Huff (GI). They wanted [place] to f/u re: Sodium (slightly decreased). Dr. Lorin Picket would like to repeat non-fasting lab in one week (recheck Sodium)

## 2013-01-08 ENCOUNTER — Other Ambulatory Visit: Payer: Self-pay | Admitting: Internal Medicine

## 2013-01-08 NOTE — Telephone Encounter (Signed)
Pt called back and r/s lab appointment 7/3

## 2013-01-08 NOTE — Telephone Encounter (Signed)
noted 

## 2013-01-10 ENCOUNTER — Other Ambulatory Visit (INDEPENDENT_AMBULATORY_CARE_PROVIDER_SITE_OTHER): Payer: Medicare Other

## 2013-01-10 DIAGNOSIS — E871 Hypo-osmolality and hyponatremia: Secondary | ICD-10-CM

## 2013-01-10 DIAGNOSIS — R748 Abnormal levels of other serum enzymes: Secondary | ICD-10-CM | POA: Diagnosis not present

## 2013-01-10 LAB — HEPATIC FUNCTION PANEL
Alkaline Phosphatase: 51 U/L (ref 39–117)
Bilirubin, Direct: 0.1 mg/dL (ref 0.0–0.3)
Total Bilirubin: 0.6 mg/dL (ref 0.3–1.2)
Total Protein: 7.6 g/dL (ref 6.0–8.3)

## 2013-01-14 NOTE — Progress Notes (Signed)
Pt informed of results & has seen Dr. Earnest Conroy PA. She has to do the stool cards. Once she has completed that, they will eval and let her know what they want to do next. (Sent copy of labs to GI)

## 2013-01-28 ENCOUNTER — Other Ambulatory Visit: Payer: Self-pay | Admitting: *Deleted

## 2013-01-28 DIAGNOSIS — H2510 Age-related nuclear cataract, unspecified eye: Secondary | ICD-10-CM | POA: Diagnosis not present

## 2013-01-28 MED ORDER — SIMVASTATIN 20 MG PO TABS: 20.0000 mg | ORAL_TABLET | Freq: Every evening | ORAL | Status: DC

## 2013-02-11 DIAGNOSIS — D140 Benign neoplasm of middle ear, nasal cavity and accessory sinuses: Secondary | ICD-10-CM | POA: Diagnosis not present

## 2013-02-12 DIAGNOSIS — Z79899 Other long term (current) drug therapy: Secondary | ICD-10-CM | POA: Diagnosis not present

## 2013-02-12 DIAGNOSIS — M069 Rheumatoid arthritis, unspecified: Secondary | ICD-10-CM | POA: Diagnosis not present

## 2013-03-01 DIAGNOSIS — H18419 Arcus senilis, unspecified eye: Secondary | ICD-10-CM | POA: Diagnosis not present

## 2013-03-01 DIAGNOSIS — H02839 Dermatochalasis of unspecified eye, unspecified eyelid: Secondary | ICD-10-CM | POA: Diagnosis not present

## 2013-03-01 DIAGNOSIS — H2510 Age-related nuclear cataract, unspecified eye: Secondary | ICD-10-CM | POA: Diagnosis not present

## 2013-03-01 DIAGNOSIS — H25019 Cortical age-related cataract, unspecified eye: Secondary | ICD-10-CM | POA: Diagnosis not present

## 2013-03-07 ENCOUNTER — Other Ambulatory Visit: Payer: Self-pay | Admitting: *Deleted

## 2013-03-07 MED ORDER — TANDEM PLUS 162-115.2-1 MG PO CAPS: 1.0000 mg | ORAL_CAPSULE | Freq: Every day | ORAL | Status: DC

## 2013-03-07 MED ORDER — POTASSIUM CHLORIDE CRYS ER 20 MEQ PO TBCR: EXTENDED_RELEASE_TABLET | ORAL | Status: DC

## 2013-03-16 ENCOUNTER — Other Ambulatory Visit: Payer: Self-pay | Admitting: Internal Medicine

## 2013-03-21 ENCOUNTER — Other Ambulatory Visit: Payer: Self-pay | Admitting: Internal Medicine

## 2013-03-25 ENCOUNTER — Ambulatory Visit (INDEPENDENT_AMBULATORY_CARE_PROVIDER_SITE_OTHER): Payer: Medicare Other | Admitting: Internal Medicine

## 2013-03-25 ENCOUNTER — Encounter: Payer: Self-pay | Admitting: Internal Medicine

## 2013-03-25 VITALS — BP 120/64 | HR 57 | Temp 97.9°F | Ht 64.2 in | Wt 165.5 lb

## 2013-03-25 DIAGNOSIS — D649 Anemia, unspecified: Secondary | ICD-10-CM

## 2013-03-25 DIAGNOSIS — I10 Essential (primary) hypertension: Secondary | ICD-10-CM

## 2013-03-25 DIAGNOSIS — R7989 Other specified abnormal findings of blood chemistry: Secondary | ICD-10-CM

## 2013-03-25 DIAGNOSIS — M069 Rheumatoid arthritis, unspecified: Secondary | ICD-10-CM

## 2013-03-25 DIAGNOSIS — Z23 Encounter for immunization: Secondary | ICD-10-CM

## 2013-03-25 DIAGNOSIS — G4733 Obstructive sleep apnea (adult) (pediatric): Secondary | ICD-10-CM

## 2013-03-25 DIAGNOSIS — K838 Other specified diseases of biliary tract: Secondary | ICD-10-CM

## 2013-03-25 DIAGNOSIS — E7800 Pure hypercholesterolemia, unspecified: Secondary | ICD-10-CM | POA: Diagnosis not present

## 2013-03-25 DIAGNOSIS — K219 Gastro-esophageal reflux disease without esophagitis: Secondary | ICD-10-CM | POA: Diagnosis not present

## 2013-03-25 DIAGNOSIS — R928 Other abnormal and inconclusive findings on diagnostic imaging of breast: Secondary | ICD-10-CM

## 2013-03-25 DIAGNOSIS — R945 Abnormal results of liver function studies: Secondary | ICD-10-CM

## 2013-03-25 NOTE — Assessment & Plan Note (Signed)
Reflux controlled

## 2013-03-25 NOTE — Assessment & Plan Note (Signed)
Is followed by Dr Kernodle.  Stable.   

## 2013-03-25 NOTE — Assessment & Plan Note (Addendum)
Was found to have a dilated common bile duct on recent abdominal ultrasound.  Referred to GI for further w/up.  They felt - stable.  Recommended no further w/up.    

## 2013-03-25 NOTE — Assessment & Plan Note (Signed)
Low cholesterol diet.  On simvastatin.  Follow lipid profile and liver panel.   

## 2013-03-25 NOTE — Assessment & Plan Note (Signed)
Blood pressure is doing well.  Same medication regimen.  Follow metabolic panel.  

## 2013-03-25 NOTE — Progress Notes (Signed)
Subjective:    Patient ID: Kaitlyn Huff, female    DOB: Jul 21, 1940, 72 y.o.   MRN: [id number]  HPI 72 year old female with past history of hypertension, hypercholesterolemia, GERD, depression and rheumatoid arthritis who comes in today for a scheduled follow up.   Had been having issues with not sleeping.  Increased stress and some depression in dealing with her husbands death.  She had a sleep study.  Did reveal sleep apnea.  Recommended CPAP.  She has never followed through with getting the CPAP.  Discussed at length with her today.  We discussed the risk of untreated sleep apnea.  Discussed her problems with sleeping.  Discussed that sedating medications can worsen sleep apnea.  She continues to decline CPAP.  She feels her breathing is stable.  No chest pain or tightness.  Joints stable.  Still seeing Dr Gavin Potters.  Saw Dr Lemar Livings for her breast.  Lipoma on chest wall.  She states she needs smaller potassium pills.  Has no problems swallowing any other meds.  She was found to have abnormal liver function.  Abdominal ultrasound revealed an abnormal dilatation of the common bile duct up to 8.58mm and changes that appeared to c/w extreme fatty infiltration.  Saw GI.  Felt no further w/up warranted for the dilated CBD.  Felt stable.  She denies any abdominal pain, nausea or vomiting.      Past Medical History  Diagnosis Date  . Rheumatoid arthritis(714.0)     MTX transaminitis, Leflunomide (rash), enbrel, plaquinil, prednisone, remicade, Imuran (transaminitis)  . Hyperlipidemia   . Diverticulitis   . Osteoarthritis   . GERD (gastroesophageal reflux disease)   . Hypertension   . Anemia   . Positive PPD     s/p INH (2006)  . Osteoporosis     actonel  . 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 aspirin 81 MG tablet Take 81 mg by mouth daily.      . calcium-vitamin D (OSCAL 500/200 D-3)  500-200 MG-UNIT per tablet Take 1 tablet by mouth 2 (two) times daily.      Marland Kitchen FeFum-FePo-FA-B Cmp-C-Zn-Mn-Cu (TANDEM PLUS) 162-115.2-1 MG CAPS Take 1 mg by mouth daily.  90 each  1  . Fluticasone-Salmeterol (ADVAIR) 250-50 MCG/DOSE AEPB Inhale 1 puff into the lungs every 12 (twelve) hours.  60 each  5  . furosemide (LASIX) 20 MG tablet Take 1 tablet (20 mg total) by mouth daily.  90 tablet  1  . gabapentin (NEURONTIN) 300 MG capsule Take 300 mg by mouth 4 (four) times daily.      Marland Kitchen gentamicin cream (GARAMYCIN) 0.1 % Apply topically 3 (three) times daily.      . hydroxychloroquine (PLAQUENIL) 200 MG tablet Take by mouth 2 (two) times daily.      . InFLIXimab (REMICADE IV) Inject every 6 weeks      . metolazone (ZAROXOLYN) 5 MG tablet Take 5 mg by mouth daily.      . metoprolol succinate (TOPROL-XL) 50 MG 24 hr tablet Take 50 mg by mouth daily. Take with or immediately following a meal.      . Multiple Vitamin (MULTIVITAMIN) capsule Take 1 capsule by mouth daily.      Marland Kitchen NEXIUM 40 MG capsule TAKE 1 CAPSULE TWICE A DAY  180 capsule  1  . prednisoLONE 5 MG TABS Take by mouth daily.      Marland Kitchen  risedronate (ACTONEL) 150 MG tablet Take 150 mg by mouth every 30 (thirty) days. with water on empty stomach, nothing by mouth or lie down for next 30 minutes.      . simvastatin (ZOCOR) 20 MG tablet Take 1 tablet (20 mg total) by mouth every evening.  30 tablet  5  . sodium chloride (OCEAN) 0.65 % nasal spray Place 1 spray into the nose as needed for congestion.      Marland Kitchen ZYRTEC ALLERGY 10 MG tablet TAKE 1 TABLET DAILY  90 tablet  3   No current facility-administered medications on file prior to visit.    Review of Systems Patient did not report any headache, lightheadedness or dizziness.  No significant sinus or allergy symptoms.  Minimal dripping and drainage.  No chest pain or tightness.  No palpitations.  Breathing stable.  Saw Dr Lemar Livings.  Felt to have a lipoma - chest wall. Everything checked out fine.  No  nausea or vomiting.  No bowel change.  Increased stress.  Not sleeping.  Had sleep study.  Results as outlined.  Needs to f/u with CPAP.  Discussed with her today.  She declines.         Objective:   Physical Exam  Filed Vitals:   03/25/13 0758  BP: 120/64  Pulse: 57  Temp: 97.9 F (77.22 C)   72 year old female in no acute distress.   HEENT:  Nares- clear.  Oropharynx - without lesions.  TMs visualized - without erythema.  NECK:  Supple.  Nontender.  No audible bruit.  HEART:  Appears to be regular. LUNGS:  No crackles or wheezing audible.  Respirations even and unlabored.  RADIAL PULSE:  Equal bilaterally.   ABDOMEN:  Soft, nontender.  Bowel sounds present and normal.  No audible abdominal bruit.    EXTREMITIES:  No increased edema present.  DP pulses palpable and equal bilaterally.           Assessment & Plan:  BREAST NODULE.  Saw Dr Lemar Livings.  Lipoma.  No further w/up warranted.  Due 6 month f/u mammogram and ultrasound.  Ordered.     DIFFICULTY SLEEPING.  Discussed at length with her today.  Discussed the need for treatment of her sleep apnea before prescribing sedating medications.  She declines CPAP.  Follow.        PULMONARY.  Had a split night sleep study.  Results as outlined.  Avoid sedating medication.  Avoid sleeping supine.  Needs CPAP.   She declines.       INCREASED PSYCHOSOCIAL STRESSORS.  Feels she is handling things relatively well.  Desires no counseling.  Follow.   CHANGE OF MEDICATION.  She request to have a smaller potassium pill.  Will change to Micro K as directed.    HEALTH MAINTENANCE.  Physical 11/20/12.   She is s/p hysterectomy.   Mammogram 11/28/11 recommended additional views.  11/30/11 follow up mammogram - BiRADS II.  Mammogram 2/14 - Birads III.  Referred to Dr Lemar Livings.  See above.  Schedule follow up left breast mammogram and ultrasound.

## 2013-03-26 ENCOUNTER — Encounter: Payer: Self-pay | Admitting: Internal Medicine

## 2013-03-26 DIAGNOSIS — R7989 Other specified abnormal findings of blood chemistry: Secondary | ICD-10-CM | POA: Insufficient documentation

## 2013-03-26 DIAGNOSIS — R945 Abnormal results of liver function studies: Secondary | ICD-10-CM | POA: Insufficient documentation

## 2013-03-26 MED ORDER — POTASSIUM CHLORIDE ER 10 MEQ PO CPCR: ORAL_CAPSULE | ORAL | Status: DC

## 2013-03-26 NOTE — Assessment & Plan Note (Signed)
Follow cbc.  

## 2013-03-26 NOTE — Assessment & Plan Note (Signed)
Sleep study revealed severe sleep apnea.  Needs CPAP.  She declines.  Discussed the importance of treating sleep apnea and the risk of untreated sleep apnea.  Also discussed avoiding sedating medications.  She continues to decline CPAP.

## 2013-03-26 NOTE — Assessment & Plan Note (Signed)
Abdominal ultrasound revealed fatty liver.  Follow.   

## 2013-04-08 DIAGNOSIS — M069 Rheumatoid arthritis, unspecified: Secondary | ICD-10-CM | POA: Diagnosis not present

## 2013-04-08 DIAGNOSIS — R7402 Elevation of levels of lactic acid dehydrogenase (LDH): Secondary | ICD-10-CM | POA: Diagnosis not present

## 2013-04-15 DIAGNOSIS — D649 Anemia, unspecified: Secondary | ICD-10-CM | POA: Diagnosis not present

## 2013-04-15 DIAGNOSIS — H2510 Age-related nuclear cataract, unspecified eye: Secondary | ICD-10-CM | POA: Diagnosis not present

## 2013-04-15 DIAGNOSIS — H269 Unspecified cataract: Secondary | ICD-10-CM | POA: Diagnosis not present

## 2013-04-16 DIAGNOSIS — H2510 Age-related nuclear cataract, unspecified eye: Secondary | ICD-10-CM | POA: Diagnosis not present

## 2013-04-17 ENCOUNTER — Telehealth: Payer: Self-pay | Admitting: Internal Medicine

## 2013-04-17 NOTE — Telephone Encounter (Signed)
Have I already signed this.  I could not find the order.

## 2013-04-17 NOTE — Telephone Encounter (Signed)
Yes maam, you have

## 2013-04-17 NOTE — Telephone Encounter (Signed)
Kaitlyn Huff from [facility] called to schedule 6 month f/u for L breast mammogram, L breast mass. Pt has been scheduled for 04/26/13 @ 11am. Order given to [place] to sign. Patient aware of apt.

## 2013-04-24 ENCOUNTER — Telehealth: Payer: Self-pay | Admitting: Internal Medicine

## 2013-04-24 NOTE — Telephone Encounter (Signed)
Pt left message saying that she recently got off antibiotics and now has itching in vaginal area. Pt is wanting to know if she could have something called into [facility] on [address] or if she needs to come in ???

## 2013-04-24 NOTE — Telephone Encounter (Signed)
Confirm no allergy to monistat.  If no then, She can try to take monistat -3 - one applicator q hs x 3 nights.  If persistent symptoms will need reevaluated.

## 2013-04-24 NOTE — Telephone Encounter (Signed)
Pt.notified

## 2013-05-03 ENCOUNTER — Encounter: Payer: Self-pay | Admitting: Internal Medicine

## 2013-05-03 ENCOUNTER — Telehealth: Payer: Self-pay | Admitting: *Deleted

## 2013-05-03 ENCOUNTER — Ambulatory Visit (INDEPENDENT_AMBULATORY_CARE_PROVIDER_SITE_OTHER): Payer: Medicare Other | Admitting: Internal Medicine

## 2013-05-03 VITALS — BP 110/70 | HR 60 | Temp 97.9°F | Ht 64.2 in | Wt 163.2 lb

## 2013-05-03 DIAGNOSIS — I10 Essential (primary) hypertension: Secondary | ICD-10-CM

## 2013-05-03 MED ORDER — FLUCONAZOLE 150 MG PO TABS: ORAL_TABLET | ORAL | Status: DC

## 2013-05-03 NOTE — Telephone Encounter (Signed)
Spoke with pt she will come in today as work in

## 2013-05-03 NOTE — Telephone Encounter (Signed)
Given persistent infection - needs eval.  See if she can come in this pm as a work in - in between pts.  May have to wait

## 2013-05-03 NOTE — Telephone Encounter (Signed)
Pt called to report that she has tried the Monistat as mentioned on 04/24/13 (see note) and she is still having the vaginal itch. Would like to know if she could try a pill to help clear it up. Please advise. Pt has cataract surgery on Monday & was hoping to have it cleared up by then

## 2013-05-06 DIAGNOSIS — H2510 Age-related nuclear cataract, unspecified eye: Secondary | ICD-10-CM | POA: Diagnosis not present

## 2013-05-06 DIAGNOSIS — H269 Unspecified cataract: Secondary | ICD-10-CM | POA: Diagnosis not present

## 2013-05-12 ENCOUNTER — Encounter: Payer: Self-pay | Admitting: Internal Medicine

## 2013-05-12 NOTE — Progress Notes (Signed)
Subjective:    Patient ID: Kaitlyn Huff, female    DOB: 22-May-1941, 72 y.o.   MRN: [id number]  Vaginal Itching  72 year old female with past history of hypertension, hypercholesterolemia, GERD, depression and rheumatoid arthritis who comes in today as a work in with concerns regarding some vaginal itching.   Located around the vagina.  Denies any intra vaginal itching.  No discharge.  No burning with urination.  No dysuria or hematuria.   She denies any abdominal pain, nausea or vomiting.  Used monistat.  Still with some irritation.  Has been on Emycin - prescribed by her dentist.      Past Medical History  Diagnosis Date  . Rheumatoid arthritis(714.0)     MTX transaminitis, Leflunomide (rash), enbrel, plaquinil, prednisone, remicade, Imuran (transaminitis)  . Hyperlipidemia   . Diverticulitis   . Osteoarthritis   . GERD (gastroesophageal reflux disease)   . Hypertension   . Anemia   . Positive PPD     s/p INH (2006)  . Osteoporosis     actonel  . 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 aspirin 81 MG tablet Take 81 mg by mouth daily.      . calcium-vitamin D (OSCAL 500/200 D-3) 500-200 MG-UNIT per tablet Take 1 tablet by mouth 2 (two) times daily.      Marland Kitchen FeFum-FePo-FA-B Cmp-C-Zn-Mn-Cu (TANDEM PLUS) 162-115.2-1 MG CAPS Take 1 mg by mouth daily.  90 each  1  . Fluticasone-Salmeterol (ADVAIR) 250-50 MCG/DOSE AEPB Inhale 1 puff into the lungs every 12 (twelve) hours.  60 each  5  . furosemide (LASIX) 20 MG tablet Take 1 tablet (20 mg total) by mouth daily.  90 tablet  1  . gabapentin (NEURONTIN) 300 MG capsule Take 300 mg by mouth 4 (four) times daily.      Marland Kitchen gentamicin cream (GARAMYCIN) 0.1 % Apply topically 3 (three) times daily.      . hydroxychloroquine (PLAQUENIL) 200 MG tablet Take by mouth 2 (two) times daily.      . InFLIXimab (REMICADE IV) Inject every 6 weeks      .  metolazone (ZAROXOLYN) 5 MG tablet Take 5 mg by mouth daily.      . metoprolol succinate (TOPROL-XL) 50 MG 24 hr tablet Take 50 mg by mouth daily. Take with or immediately following a meal.      . Multiple Vitamin (MULTIVITAMIN) capsule Take 1 capsule by mouth daily.      Marland Kitchen NEXIUM 40 MG capsule TAKE 1 CAPSULE TWICE A DAY  180 capsule  1  . potassium chloride (MICRO-K) 10 MEQ CR capsule Take two tablets bid  360 capsule  1  . prednisoLONE 5 MG TABS Take by mouth daily.      . risedronate (ACTONEL) 150 MG tablet Take 150 mg by mouth every 30 (thirty) days. with water on empty stomach, nothing by mouth or lie down for next 30 minutes.      . simvastatin (ZOCOR) 20 MG tablet Take 1 tablet (20 mg total) by mouth every evening.  30 tablet  5  . sodium chloride (OCEAN) 0.65 % nasal spray Place 1 spray into the nose as needed for congestion.      Marland Kitchen ZYRTEC ALLERGY 10 MG tablet TAKE 1 TABLET DAILY  90 tablet  3   No current facility-administered medications on file prior to visit.  Review of Systems Patient did not report any fever.  Breathing stable.  Eating and drinking well.  No nausea or vomiting.  No bowel change and no urinary symptoms.  Some irritation around the vagina.  No vaginal discharge.         Objective:   Physical Exam  Filed Vitals:   05/03/13 1627  BP: 110/70  Pulse: 60  Temp: 97.9 F (48.63 C)   72 year old female in no acute distress.    HEART:  Appears to be regular. LUNGS:  No crackles or wheezing audible.  Respirations even and unlabored.  RADIAL PULSE:  Equal bilaterally.   ABDOMEN:  Soft, nontender.  Bowel sounds present and normal.  No audible abdominal bruit.    GU:  Normal external genitalia.  Question if minimal erythema peri vaginal region.  No lesions.             Assessment & Plan:  PERIVAGINAL IRRITATION.  Exam as outlined.  Diflucan 150mg  x 1.  Repeat x 1 in three days if no completely resolved.     HEALTH MAINTENANCE.  Physical 11/20/12.   She is s/p  hysterectomy.   Mammogram 11/28/11 recommended additional views.  11/30/11 follow up mammogram - BiRADS II.  Mammogram 2/14 - Birads III.  Referred to Dr Lemar Livings.  See above.  Scheduled follow up left breast mammogram and ultrasound.

## 2013-05-12 NOTE — Assessment & Plan Note (Signed)
Blood pressure is doing well.  Same medication regimen.  Follow metabolic panel.  

## 2013-05-13 ENCOUNTER — Other Ambulatory Visit: Payer: Self-pay | Admitting: *Deleted

## 2013-05-13 ENCOUNTER — Telehealth: Payer: Self-pay | Admitting: Internal Medicine

## 2013-05-13 MED ORDER — HYDROXYCHLOROQUINE SULFATE 200 MG PO TABS: 200.0000 mg | ORAL_TABLET | Freq: Two times a day (BID) | ORAL | Status: DC

## 2013-05-13 MED ORDER — FUROSEMIDE 20 MG PO TABS: 20.0000 mg | ORAL_TABLET | Freq: Every day | ORAL | Status: DC

## 2013-05-13 MED ORDER — PREDNISOLONE 5 MG PO TABS: 5.0000 mg | ORAL_TABLET | Freq: Every day | ORAL | Status: DC

## 2013-05-13 MED ORDER — METOPROLOL SUCCINATE ER 50 MG PO TB24: 50.0000 mg | ORAL_TABLET | Freq: Every day | ORAL | Status: DC

## 2013-05-13 NOTE — Telephone Encounter (Signed)
Sent refills in for all of the medications listed below today

## 2013-05-13 NOTE — Telephone Encounter (Signed)
States [facility] faxed request for furosemide last week.  States prednisone, Toprol and hydroxychloroquine refill requests have been faxed by [facility] today.

## 2013-05-14 ENCOUNTER — Ambulatory Visit: Payer: Self-pay | Admitting: Internal Medicine

## 2013-05-14 DIAGNOSIS — N63 Unspecified lump in unspecified breast: Secondary | ICD-10-CM | POA: Diagnosis not present

## 2013-05-14 DIAGNOSIS — D1739 Benign lipomatous neoplasm of skin and subcutaneous tissue of other sites: Secondary | ICD-10-CM | POA: Diagnosis not present

## 2013-05-17 ENCOUNTER — Other Ambulatory Visit: Payer: Self-pay | Admitting: *Deleted

## 2013-05-17 MED ORDER — FUROSEMIDE 20 MG PO TABS: 20.0000 mg | ORAL_TABLET | Freq: Every day | ORAL | Status: DC

## 2013-05-27 ENCOUNTER — Encounter: Payer: Self-pay | Admitting: Internal Medicine

## 2013-06-03 DIAGNOSIS — M069 Rheumatoid arthritis, unspecified: Secondary | ICD-10-CM | POA: Diagnosis not present

## 2013-06-03 DIAGNOSIS — R21 Rash and other nonspecific skin eruption: Secondary | ICD-10-CM | POA: Diagnosis not present

## 2013-06-13 DIAGNOSIS — L820 Inflamed seborrheic keratosis: Secondary | ICD-10-CM | POA: Diagnosis not present

## 2013-06-13 DIAGNOSIS — L299 Pruritus, unspecified: Secondary | ICD-10-CM | POA: Diagnosis not present

## 2013-06-18 DIAGNOSIS — Z79899 Other long term (current) drug therapy: Secondary | ICD-10-CM | POA: Diagnosis not present

## 2013-07-29 DIAGNOSIS — M069 Rheumatoid arthritis, unspecified: Secondary | ICD-10-CM | POA: Diagnosis not present

## 2013-07-30 ENCOUNTER — Ambulatory Visit (INDEPENDENT_AMBULATORY_CARE_PROVIDER_SITE_OTHER): Payer: Medicare Other | Admitting: Internal Medicine

## 2013-07-30 ENCOUNTER — Encounter: Payer: Self-pay | Admitting: Internal Medicine

## 2013-07-30 VITALS — BP 120/70 | HR 69 | Temp 98.5°F | Ht 64.5 in | Wt 165.2 lb

## 2013-07-30 DIAGNOSIS — L299 Pruritus, unspecified: Secondary | ICD-10-CM

## 2013-07-30 DIAGNOSIS — K219 Gastro-esophageal reflux disease without esophagitis: Secondary | ICD-10-CM

## 2013-07-30 DIAGNOSIS — I10 Essential (primary) hypertension: Secondary | ICD-10-CM

## 2013-07-30 DIAGNOSIS — D649 Anemia, unspecified: Secondary | ICD-10-CM

## 2013-07-30 DIAGNOSIS — R7989 Other specified abnormal findings of blood chemistry: Secondary | ICD-10-CM

## 2013-07-30 DIAGNOSIS — K838 Other specified diseases of biliary tract: Secondary | ICD-10-CM

## 2013-07-30 DIAGNOSIS — R634 Abnormal weight loss: Secondary | ICD-10-CM

## 2013-07-30 DIAGNOSIS — E7800 Pure hypercholesterolemia, unspecified: Secondary | ICD-10-CM | POA: Diagnosis not present

## 2013-07-30 DIAGNOSIS — F439 Reaction to severe stress, unspecified: Secondary | ICD-10-CM

## 2013-07-30 DIAGNOSIS — G4733 Obstructive sleep apnea (adult) (pediatric): Secondary | ICD-10-CM

## 2013-07-30 DIAGNOSIS — R928 Other abnormal and inconclusive findings on diagnostic imaging of breast: Secondary | ICD-10-CM

## 2013-07-30 DIAGNOSIS — R945 Abnormal results of liver function studies: Secondary | ICD-10-CM

## 2013-07-30 DIAGNOSIS — Z733 Stress, not elsewhere classified: Secondary | ICD-10-CM

## 2013-07-30 DIAGNOSIS — M069 Rheumatoid arthritis, unspecified: Secondary | ICD-10-CM

## 2013-07-30 MED ORDER — FLUOXETINE HCL 10 MG PO TABS: 10.0000 mg | ORAL_TABLET | Freq: Every day | ORAL | Status: DC

## 2013-07-30 MED ORDER — ALPRAZOLAM 0.25 MG PO TABS: ORAL_TABLET | ORAL | Status: DC

## 2013-07-30 NOTE — Progress Notes (Signed)
Pre-visit discussion using our clinic review tool. No additional management support is needed unless otherwise documented below in the visit note.  

## 2013-07-31 ENCOUNTER — Encounter: Payer: Self-pay | Admitting: Emergency Medicine

## 2013-08-04 ENCOUNTER — Encounter: Payer: Self-pay | Admitting: Internal Medicine

## 2013-08-04 DIAGNOSIS — F439 Reaction to severe stress, unspecified: Secondary | ICD-10-CM | POA: Insufficient documentation

## 2013-08-04 DIAGNOSIS — L299 Pruritus, unspecified: Secondary | ICD-10-CM | POA: Insufficient documentation

## 2013-08-04 DIAGNOSIS — R634 Abnormal weight loss: Secondary | ICD-10-CM | POA: Insufficient documentation

## 2013-08-04 NOTE — Progress Notes (Signed)
Subjective:    Patient ID: Kaitlyn Huff, female    DOB: [birth year], 73 y.o.   MRN: [id number]  HPI 73 year old female with past history of hypertension, hypercholesterolemia, GERD, depression and rheumatoid arthritis who comes in today to follow up on these issues as well as for a complete physical exam.   Still having issues with not sleeping.  Increased stress and some depression.  She had a sleep study.  Did reveal sleep apnea.  Recommended CPAP.  She has never followed through with getting the CPAP.  Have discussed at length with her.   We have discussed the risk of untreated sleep apnea.  Discussed her problems with sleeping.  Discussed that sedating medications can worsen sleep apnea.  She continues to decline CPAP.  She feels her breathing is stable.  No chest pain or tightness.  Joints stable.  Still seeing Dr [name].  Saw Dr Bary Castilla for her breast.  Lipoma on chest wall.  She was found to have abnormal liver function.  Abdominal ultrasound revealed an abnormal dilatation of the common bile duct up to 8.75mm and changes that appeared to c/w extreme fatty infiltration.  Saw GI.  Felt no further w/up warranted for the dilated CBD.  Felt stable.  She denies any abdominal pain, nausea or vomiting.  Some itching intermittently.  No rash.  Was questioning if related to "nerves".   Some decreased appetite.  Some weight loss.      Past Medical History  Diagnosis Date  . Rheumatoid arthritis(714.0)     MTX transaminitis, Leflunomide (rash), enbrel, plaquinil, prednisone, remicade, Imuran (transaminitis)  . Hyperlipidemia   . Diverticulitis   . Osteoarthritis   . GERD (gastroesophageal reflux disease)   . Hypertension   . Anemia   . Positive PPD     s/p INH (2006)  . Osteoporosis     actonel  . 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 aspirin 81 MG tablet Take 81 mg by mouth daily.       . calcium-vitamin D (OSCAL 500/200 D-3) 500-200 MG-UNIT per tablet Take 1 tablet by mouth 2 (two) times daily.      Marland Kitchen FeFum-FePo-FA-B Cmp-C-Zn-Mn-Cu (TANDEM PLUS) 162-115.2-1 MG CAPS Take 1 mg by mouth daily.  90 each  1  . Fluticasone-Salmeterol (ADVAIR) 250-50 MCG/DOSE AEPB Inhale 1 puff into the lungs every 12 (twelve) hours.  60 each  5  . furosemide (LASIX) 20 MG tablet Take 1 tablet (20 mg total) by mouth daily.  90 tablet  1  . gabapentin (NEURONTIN) 300 MG capsule Take 300 mg by mouth 4 (four) times daily.      . hydroxychloroquine (PLAQUENIL) 200 MG tablet Take 1 tablet (200 mg total) by mouth 2 (two) times daily.  180 tablet  1  . InFLIXimab (REMICADE IV) Inject every 6 weeks      . metolazone (ZAROXOLYN) 5 MG tablet Take 5 mg by mouth daily.      . metoprolol succinate (TOPROL-XL) 50 MG 24 hr tablet Take 1 tablet (50 mg total) by mouth daily. Take with or immediately following a meal.  90 tablet  1  . Multiple Vitamin (MULTIVITAMIN) capsule Take 1 capsule by mouth daily.      Marland Kitchen NEXIUM 40 MG capsule TAKE 1 CAPSULE TWICE A DAY  180 capsule  1  . potassium chloride (MICRO-K) 10 MEQ  CR capsule Take two tablets bid  360 capsule  1  . prednisoLONE 5 MG TABS tablet Take 1 tablet (5 mg total) by mouth daily.  90 each  1  . risedronate (ACTONEL) 150 MG tablet Take 150 mg by mouth every 30 (thirty) days. with water on empty stomach, nothing by mouth or lie down for next 30 minutes.      . simvastatin (ZOCOR) 20 MG tablet Take 1 tablet (20 mg total) by mouth every evening.  30 tablet  5  . sodium chloride (OCEAN) 0.65 % nasal spray Place 1 spray into the nose as needed for congestion.      Marland Kitchen ZYRTEC ALLERGY 10 MG tablet TAKE 1 TABLET DAILY  90 tablet  3  . fluconazole (DIFLUCAN) 150 MG tablet Take one pill x 1 day and then if persistent symptoms repeat x 1 in three days.  2 tablet  0   No current facility-administered medications on file prior to visit.    Review of Systems Patient did not  report any headache, lightheadedness or dizziness.  No significant sinus or allergy symptoms.   No chest pain or tightness.  No palpitations.  Breathing stable.  Saw Dr Bary Castilla.  Felt to have a lipoma - chest wall.  Everything checked out fine.  No nausea or vomiting.  No bowel change.  Increased stress.  Not sleeping.  Had sleep study.  Results as outlined.  Needs to f/u with CPAP.  Have discussed with her.  She declines.  Some itching.  No rash.  Some decreased appetite.  Weight loss.         Objective:   Physical Exam  Filed Vitals:   07/30/13 1543  BP: 120/70  Pulse: 69  Temp: 98.5 F (36.9 C)   Pulse recheck:  69  73 year old female in no acute distress.   HEENT:  Nares- clear.  Oropharynx - without lesions. NECK:  Supple.  Nontender.  No audible bruit.  HEART:  Appears to be regular. LUNGS:  No crackles or wheezing audible.  Respirations even and unlabored.  RADIAL PULSE:  Equal bilaterally.    BREASTS:  No nipple discharge or nipple retraction present.  Could not appreciate any distinct nodules or axillary adenopathy.  ABDOMEN:  Soft, nontender.  Bowel sounds present and normal.  No audible abdominal bruit.  GU:  Not performed.   EXTREMITIES:  No increased edema present.  DP pulses palpable and equal bilaterally.          Assessment & Plan:  BREAST NODULE.  Saw Dr Bary Castilla.  Lipoma.  No further w/up warranted.  Had f/u mammogram 05/14/13 - Birads II.  Due bilateral mammogram 2/15.  Schedule.    DIFFICULTY SLEEPING.  Discussed at length with her today.  Discussed the need for treatment of her sleep apnea before prescribing sedating medications.  She declines CPAP.  See above.  Treat with prozac as directed.  Xanax as needed.  Follow.         PULMONARY.  Had a split night sleep study.  Results as outlined.  Avoid sedating medication.  Avoid sleeping supine.  Needs CPAP.   She declines.       INCREASED PSYCHOSOCIAL STRESSORS.  Some increased stress and difficulty sleeping.  Xanax  as needed.  Prozac as directed.  Follow.    HEALTH MAINTENANCE.  Physical 11/20/12.   She is s/p hysterectomy.   Mammogram 11/28/11 recommended additional views.  11/30/11 follow up mammogram - BiRADS II.  Mammogram 2/14 - Birads III.  Referred to Dr Bary Castilla.  Had a f/u mammogram 05/14/13 - Birads II.  Due f/u bilateral mammogram 2/15.    I spent 40 minutes with the patient and more than 50% of the time was spent in consultation regarding the above.

## 2013-08-04 NOTE — Assessment & Plan Note (Signed)
Was found to have a dilated common bile duct on recent abdominal ultrasound.  Referred to GI for further w/up.  They felt - stable.  Recommended no further w/up.    

## 2013-08-04 NOTE — Assessment & Plan Note (Signed)
Sleep study revealed severe sleep apnea.  Needs CPAP.  She declines.  Have discussed the importance of treating sleep apnea and the risk of untreated sleep apnea.  Also discussed avoiding sedating medications.  She continues to decline CPAP.    

## 2013-08-04 NOTE — Assessment & Plan Note (Signed)
Blood pressure is doing well.  Same medication regimen.  Follow metabolic panel.  

## 2013-08-04 NOTE — Assessment & Plan Note (Signed)
Abdominal ultrasound revealed fatty liver.  Follow.

## 2013-08-04 NOTE — Assessment & Plan Note (Signed)
Treat stress as outlined.  Follow closely.  Further w/up pending.

## 2013-08-04 NOTE — Assessment & Plan Note (Signed)
Is followed by Dr [name].  Stable.

## 2013-08-04 NOTE — Assessment & Plan Note (Signed)
No rash.  Dr [name] gave her a cream to try.  Treat her stress.  Follow.

## 2013-08-04 NOTE — Assessment & Plan Note (Addendum)
Increased stress and associated difficulty sleeping.  Treat with Prozac as directed.  Xanax if needed.  Follow.  Get her back in soon to reassess.

## 2013-08-04 NOTE — Assessment & Plan Note (Signed)
Low cholesterol diet.  On simvastatin.  Follow lipid profile and liver panel.   

## 2013-08-04 NOTE — Assessment & Plan Note (Signed)
Reflux controlled

## 2013-08-04 NOTE — Assessment & Plan Note (Signed)
Follow cbc.  

## 2013-08-05 ENCOUNTER — Other Ambulatory Visit: Payer: Self-pay | Admitting: Internal Medicine

## 2013-08-12 ENCOUNTER — Other Ambulatory Visit: Payer: Self-pay | Admitting: Internal Medicine

## 2013-08-14 ENCOUNTER — Other Ambulatory Visit: Payer: Medicare Other

## 2013-08-20 ENCOUNTER — Other Ambulatory Visit (INDEPENDENT_AMBULATORY_CARE_PROVIDER_SITE_OTHER): Payer: Medicare Other

## 2013-08-20 DIAGNOSIS — I10 Essential (primary) hypertension: Secondary | ICD-10-CM | POA: Diagnosis not present

## 2013-08-20 DIAGNOSIS — M069 Rheumatoid arthritis, unspecified: Secondary | ICD-10-CM

## 2013-08-20 DIAGNOSIS — E7800 Pure hypercholesterolemia, unspecified: Secondary | ICD-10-CM | POA: Diagnosis not present

## 2013-08-20 DIAGNOSIS — R634 Abnormal weight loss: Secondary | ICD-10-CM

## 2013-08-20 DIAGNOSIS — R7989 Other specified abnormal findings of blood chemistry: Secondary | ICD-10-CM | POA: Diagnosis not present

## 2013-08-20 DIAGNOSIS — R945 Abnormal results of liver function studies: Secondary | ICD-10-CM

## 2013-08-20 LAB — CBC WITH DIFFERENTIAL/PLATELET
BASOS PCT: 0.5 % (ref 0.0–3.0)
Basophils Absolute: 0 10*3/uL (ref 0.0–0.1)
Eosinophils Absolute: 0.7 10*3/uL (ref 0.0–0.7)
Eosinophils Relative: 9.7 % — ABNORMAL HIGH (ref 0.0–5.0)
HEMATOCRIT: 37.3 % (ref 36.0–46.0)
HEMOGLOBIN: 12.4 g/dL (ref 12.0–15.0)
Lymphocytes Relative: 39.3 % (ref 12.0–46.0)
Lymphs Abs: 2.7 10*3/uL (ref 0.7–4.0)
MCHC: 33.3 g/dL (ref 30.0–36.0)
MCV: 95.8 fl (ref 78.0–100.0)
MONO ABS: 0.7 10*3/uL (ref 0.1–1.0)
Monocytes Relative: 9.6 % (ref 3.0–12.0)
NEUTROS ABS: 2.9 10*3/uL (ref 1.4–7.7)
Neutrophils Relative %: 40.9 % — ABNORMAL LOW (ref 43.0–77.0)
Platelets: 207 10*3/uL (ref 150.0–400.0)
RBC: 3.9 Mil/uL (ref 3.87–5.11)
RDW: 12.7 % (ref 11.5–14.6)
WBC: 7 10*3/uL (ref 4.5–10.5)

## 2013-08-20 LAB — BASIC METABOLIC PANEL
BUN: 11 mg/dL (ref 6–23)
CO2: 28 mEq/L (ref 19–32)
CREATININE: 1 mg/dL (ref 0.4–1.2)
Calcium: 9.5 mg/dL (ref 8.4–10.5)
Chloride: 103 mEq/L (ref 96–112)
GFR: 58.21 mL/min — AB (ref >60.00)
GLUCOSE: 85 mg/dL (ref 70–99)
Potassium: 4.4 mEq/L (ref 3.5–5.1)
Sodium: 138 mEq/L (ref 135–145)

## 2013-08-20 LAB — LDL CHOLESTEROL, DIRECT: Direct LDL: 73.7 mg/dL

## 2013-08-20 LAB — HEPATIC FUNCTION PANEL
ALBUMIN: 3.9 g/dL (ref 3.5–5.2)
ALK PHOS: 46 U/L (ref 39–117)
ALT: 38 U/L — ABNORMAL HIGH (ref 0–35)
AST: 38 U/L — AB (ref 0–37)
BILIRUBIN DIRECT: 0.1 mg/dL (ref 0.0–0.3)
Total Bilirubin: 0.7 mg/dL (ref 0.3–1.2)
Total Protein: 7 g/dL (ref 6.0–8.3)

## 2013-08-20 LAB — LIPID PANEL
CHOL/HDL RATIO: 3
Cholesterol: 160 mg/dL (ref 0–200)
HDL: 62.6 mg/dL (ref >39.00)
Triglycerides: 208 mg/dL — ABNORMAL HIGH (ref 0.0–149.0)
VLDL: 41.6 mg/dL — ABNORMAL HIGH (ref 0.0–40.0)

## 2013-08-20 LAB — TSH: TSH: 4.89 u[IU]/mL (ref 0.35–5.50)

## 2013-08-28 ENCOUNTER — Encounter: Payer: Self-pay | Admitting: *Deleted

## 2013-09-16 ENCOUNTER — Ambulatory Visit: Payer: Self-pay | Admitting: Internal Medicine

## 2013-09-16 DIAGNOSIS — Z1231 Encounter for screening mammogram for malignant neoplasm of breast: Secondary | ICD-10-CM | POA: Diagnosis not present

## 2013-09-16 LAB — HM MAMMOGRAPHY: HM Mammogram: NEGATIVE

## 2013-09-17 ENCOUNTER — Encounter: Payer: Self-pay | Admitting: Internal Medicine

## 2013-09-23 DIAGNOSIS — M069 Rheumatoid arthritis, unspecified: Secondary | ICD-10-CM | POA: Diagnosis not present

## 2013-09-23 DIAGNOSIS — M810 Age-related osteoporosis without current pathological fracture: Secondary | ICD-10-CM | POA: Diagnosis not present

## 2013-09-25 DIAGNOSIS — I38 Endocarditis, valve unspecified: Secondary | ICD-10-CM | POA: Diagnosis present

## 2013-09-25 DIAGNOSIS — R7611 Nonspecific reaction to tuberculin skin test without active tuberculosis: Secondary | ICD-10-CM | POA: Insufficient documentation

## 2013-09-25 DIAGNOSIS — M810 Age-related osteoporosis without current pathological fracture: Secondary | ICD-10-CM | POA: Insufficient documentation

## 2013-10-03 ENCOUNTER — Other Ambulatory Visit: Payer: Self-pay | Admitting: Internal Medicine

## 2013-10-03 ENCOUNTER — Telehealth: Payer: Self-pay | Admitting: Internal Medicine

## 2013-10-03 ENCOUNTER — Ambulatory Visit: Payer: Medicare Other | Admitting: Internal Medicine

## 2013-10-03 ENCOUNTER — Other Ambulatory Visit: Payer: Self-pay | Admitting: *Deleted

## 2013-10-03 MED ORDER — ALPRAZOLAM 0.25 MG PO TABS: ORAL_TABLET | ORAL | Status: DC

## 2013-10-03 NOTE — Telephone Encounter (Signed)
If she has injured her back, she needs to be evaluated to see what medication is needed.  I did give her alprazolam last visit - is she needing a refill on this medication.  If so, ok to refill x 1

## 2013-10-03 NOTE — Telephone Encounter (Signed)
Pt notified & will call back if sx's persists to see if we have any cancellations or to see Raquel. Fax Rx for [employer] to [facility] pt notified

## 2013-10-03 NOTE — Telephone Encounter (Signed)
The patient injured her back and she is requesting a muscle relaxer and ALPRAZolam (XANAX) 0.25 MG tablet called into her [place].

## 2013-10-05 DIAGNOSIS — L03039 Cellulitis of unspecified toe: Secondary | ICD-10-CM | POA: Insufficient documentation

## 2013-10-05 DIAGNOSIS — I10 Essential (primary) hypertension: Secondary | ICD-10-CM | POA: Insufficient documentation

## 2013-10-05 DIAGNOSIS — M6530 Trigger finger, unspecified finger: Secondary | ICD-10-CM | POA: Insufficient documentation

## 2013-10-05 DIAGNOSIS — M542 Cervicalgia: Secondary | ICD-10-CM | POA: Insufficient documentation

## 2013-10-31 DIAGNOSIS — Z79899 Other long term (current) drug therapy: Secondary | ICD-10-CM | POA: Insufficient documentation

## 2013-11-01 ENCOUNTER — Other Ambulatory Visit: Payer: Self-pay | Admitting: Internal Medicine

## 2013-11-01 MED ORDER — ALPRAZOLAM 0.25 MG PO TABS: ORAL_TABLET | ORAL | Status: DC

## 2013-11-01 NOTE — Telephone Encounter (Signed)
Last filled 10/03/13 #20, ok?

## 2013-11-01 NOTE — Telephone Encounter (Signed)
Rx faxed to pharmacy  

## 2013-11-01 NOTE — Telephone Encounter (Signed)
Asking for refill on xanax

## 2013-11-01 NOTE — Telephone Encounter (Signed)
Refilled xanax #20 with no refills.  rx on your desk.

## 2013-11-11 ENCOUNTER — Other Ambulatory Visit: Payer: Self-pay | Admitting: Internal Medicine

## 2013-11-18 DIAGNOSIS — M069 Rheumatoid arthritis, unspecified: Secondary | ICD-10-CM | POA: Diagnosis not present

## 2013-11-20 ENCOUNTER — Other Ambulatory Visit: Payer: Self-pay | Admitting: *Deleted

## 2013-11-20 MED ORDER — ALPRAZOLAM 0.25 MG PO TABS: ORAL_TABLET | ORAL | Status: DC

## 2013-11-20 NOTE — Telephone Encounter (Signed)
Ok refill x 1.  rx printed.

## 2013-11-20 NOTE — Telephone Encounter (Signed)
Pt called requesting refill on Xanax, going out of town and would like to have it refilled. Last filled #20 on 11/01/13. Ok refill?

## 2013-11-21 NOTE — Telephone Encounter (Signed)
Rx faxed to pharmacy  

## 2013-11-25 ENCOUNTER — Other Ambulatory Visit: Payer: Self-pay | Admitting: Internal Medicine

## 2013-11-26 ENCOUNTER — Other Ambulatory Visit: Payer: Self-pay | Admitting: Internal Medicine

## 2013-11-26 NOTE — Telephone Encounter (Signed)
This was just refilled 11/20/13.  How often is she taking these?

## 2013-11-26 NOTE — Telephone Encounter (Signed)
Okay to refill? Last seen in January

## 2013-12-03 ENCOUNTER — Other Ambulatory Visit: Payer: Self-pay | Admitting: Internal Medicine

## 2013-12-06 ENCOUNTER — Ambulatory Visit: Payer: Medicare Other | Admitting: Internal Medicine

## 2013-12-06 ENCOUNTER — Other Ambulatory Visit: Payer: Self-pay | Admitting: *Deleted

## 2013-12-06 MED ORDER — POTASSIUM CHLORIDE ER 10 MEQ PO CPCR: ORAL_CAPSULE | ORAL | Status: DC

## 2013-12-10 ENCOUNTER — Other Ambulatory Visit: Payer: Self-pay | Admitting: *Deleted

## 2013-12-10 DIAGNOSIS — I059 Rheumatic mitral valve disease, unspecified: Secondary | ICD-10-CM | POA: Diagnosis not present

## 2013-12-10 DIAGNOSIS — E7800 Pure hypercholesterolemia, unspecified: Secondary | ICD-10-CM | POA: Diagnosis not present

## 2013-12-10 DIAGNOSIS — I10 Essential (primary) hypertension: Secondary | ICD-10-CM | POA: Diagnosis not present

## 2013-12-10 MED ORDER — ALPRAZOLAM 0.25 MG PO TABS: ORAL_TABLET | ORAL | Status: DC

## 2013-12-10 NOTE — Telephone Encounter (Signed)
Refilled alprazolam #30 with no refills.  rx signed and on your desk.   

## 2013-12-10 NOTE — Telephone Encounter (Signed)
Refill

## 2013-12-11 NOTE — Telephone Encounter (Signed)
Rx faxed to pharmacy  

## 2013-12-18 ENCOUNTER — Telehealth: Payer: Self-pay | Admitting: Internal Medicine

## 2013-12-18 NOTE — Telephone Encounter (Signed)
Asking for muscle relaxer for muscles in shoulder and neck.  Biofreeze not helping.  [facility] [address].

## 2013-12-18 NOTE — Telephone Encounter (Signed)
Last visit 07/30/13

## 2013-12-18 NOTE — Telephone Encounter (Signed)
She has a history of rheumatoid arthritis followed by Dr [name].  She can contact his office with the current issues or will need to be evaluated here before a muscle relaxer can be called in.

## 2013-12-18 NOTE — Telephone Encounter (Signed)
Pt.notified

## 2013-12-23 DIAGNOSIS — M542 Cervicalgia: Secondary | ICD-10-CM | POA: Diagnosis not present

## 2013-12-23 DIAGNOSIS — M069 Rheumatoid arthritis, unspecified: Secondary | ICD-10-CM | POA: Diagnosis not present

## 2013-12-23 DIAGNOSIS — M25569 Pain in unspecified knee: Secondary | ICD-10-CM | POA: Diagnosis not present

## 2013-12-27 ENCOUNTER — Other Ambulatory Visit: Payer: Self-pay | Admitting: Internal Medicine

## 2014-01-03 ENCOUNTER — Other Ambulatory Visit: Payer: Self-pay | Admitting: *Deleted

## 2014-01-03 ENCOUNTER — Other Ambulatory Visit: Payer: Self-pay | Admitting: Internal Medicine

## 2014-01-03 MED ORDER — ALPRAZOLAM 0.25 MG PO TABS: ORAL_TABLET | ORAL | Status: DC

## 2014-01-03 NOTE — Telephone Encounter (Signed)
Refilled xanax #30 with no refills.  Will discuss with her more at her 01/08/14 appt.  rx signed and on your desk.  Thanks.

## 2014-01-03 NOTE — Telephone Encounter (Signed)
Appt sch 01/08/14

## 2014-01-06 NOTE — Telephone Encounter (Signed)
Rx faxed to pharmacy  

## 2014-01-08 ENCOUNTER — Encounter: Payer: Self-pay | Admitting: Internal Medicine

## 2014-01-08 ENCOUNTER — Ambulatory Visit (INDEPENDENT_AMBULATORY_CARE_PROVIDER_SITE_OTHER): Payer: Medicare Other | Admitting: Internal Medicine

## 2014-01-08 VITALS — BP 120/80 | HR 64 | Temp 98.2°F | Ht 64.5 in | Wt 159.8 lb

## 2014-01-08 DIAGNOSIS — R7989 Other specified abnormal findings of blood chemistry: Secondary | ICD-10-CM

## 2014-01-08 DIAGNOSIS — K838 Other specified diseases of biliary tract: Secondary | ICD-10-CM

## 2014-01-08 DIAGNOSIS — R945 Abnormal results of liver function studies: Secondary | ICD-10-CM

## 2014-01-08 DIAGNOSIS — E7800 Pure hypercholesterolemia, unspecified: Secondary | ICD-10-CM

## 2014-01-08 DIAGNOSIS — F439 Reaction to severe stress, unspecified: Secondary | ICD-10-CM

## 2014-01-08 DIAGNOSIS — R634 Abnormal weight loss: Secondary | ICD-10-CM

## 2014-01-08 DIAGNOSIS — D649 Anemia, unspecified: Secondary | ICD-10-CM

## 2014-01-08 DIAGNOSIS — M069 Rheumatoid arthritis, unspecified: Secondary | ICD-10-CM

## 2014-01-08 DIAGNOSIS — K219 Gastro-esophageal reflux disease without esophagitis: Secondary | ICD-10-CM

## 2014-01-08 DIAGNOSIS — G4733 Obstructive sleep apnea (adult) (pediatric): Secondary | ICD-10-CM

## 2014-01-08 DIAGNOSIS — Z733 Stress, not elsewhere classified: Secondary | ICD-10-CM

## 2014-01-08 DIAGNOSIS — I10 Essential (primary) hypertension: Secondary | ICD-10-CM

## 2014-01-08 DIAGNOSIS — Z79899 Other long term (current) drug therapy: Secondary | ICD-10-CM | POA: Diagnosis not present

## 2014-01-12 ENCOUNTER — Encounter: Payer: Self-pay | Admitting: Internal Medicine

## 2014-01-12 MED ORDER — CETIRIZINE HCL 10 MG PO TABS: 10.0000 mg | ORAL_TABLET | Freq: Every day | ORAL | Status: DC

## 2014-01-12 MED ORDER — ESOMEPRAZOLE MAGNESIUM 40 MG PO CPDR: 40.0000 mg | DELAYED_RELEASE_CAPSULE | Freq: Two times a day (BID) | ORAL | Status: DC

## 2014-01-12 MED ORDER — ALPRAZOLAM 0.25 MG PO TABS: ORAL_TABLET | ORAL | Status: DC

## 2014-01-12 MED ORDER — SIMVASTATIN 20 MG PO TABS: 20.0000 mg | ORAL_TABLET | Freq: Every day | ORAL | Status: DC

## 2014-01-12 MED ORDER — METOPROLOL SUCCINATE ER 50 MG PO TB24: 50.0000 mg | ORAL_TABLET | Freq: Every day | ORAL | Status: DC

## 2014-01-12 NOTE — Assessment & Plan Note (Signed)
Blood pressure is doing well.  Same medication regimen.  Follow metabolic panel.  

## 2014-01-12 NOTE — Assessment & Plan Note (Signed)
Follow cbc.  

## 2014-01-12 NOTE — Assessment & Plan Note (Signed)
Weight down several pounds from last check.  States feels better.  Will follow.

## 2014-01-12 NOTE — Progress Notes (Signed)
Subjective:    Patient ID: Kaitlyn Huff, female    DOB: May 30, 1941, 73 y.o.   MRN: [id number]  HPI 73 year old female with past history of hypertension, hypercholesterolemia, GERD, depression and rheumatoid arthritis who comes in today for a scheduled follow up.   She had a sleep study.  Did reveal sleep apnea.  Recommended CPAP.  She has never followed through with getting the CPAP.  Have discussed at length with her.   We have discussed the risk of untreated sleep apnea.  Discussed her problems with sleeping.  Discussed that sedating medications can worsen sleep apnea.  She continues to decline CPAP.  She feels her breathing is stable.  No chest pain or tightness.  Joints stable.  Still seeing Dr [name].  Saw Dr Bary Castilla for her breast.  Lipoma on chest wall.  She was found to have abnormal liver function.  Abdominal ultrasound revealed an abnormal dilatation of the common bile duct up to 8.29mm and changes that appeared to c/w extreme fatty infiltration.  Saw GI.  Felt no further w/up warranted for the dilated CBD.  Felt stable.  She denies any abdominal pain, nausea or vomiting.  Overall feels better.  Sleeping better.  She takes one xanax in the evening to help her relax.  Feels better since she is sleeping better.  Just recently saw Dr [name].  S/p aspiration of her knee.  Placed on prednisone taper.  Feels better.      Past Medical History  Diagnosis Date  . Rheumatoid arthritis(714.0)     MTX transaminitis, Leflunomide (rash), enbrel, plaquinil, prednisone, remicade, Imuran (transaminitis)  . Hyperlipidemia   . Diverticulitis   . Osteoarthritis   . GERD (gastroesophageal reflux disease)   . Hypertension   . Anemia   . Positive PPD     s/p INH (2006)  . Osteoporosis     actonel  . 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 ADVAIR DISKUS 250-50 MCG/DOSE AEPB INHALE 1 PUFF INTO  THE LUNGS EVERY 12 (TWELVE) HOURS  1 each  5  . ALPRAZolam (XANAX) 0.25 MG tablet 1/2 - 1 tablet q day prn  30 tablet  0  . aspirin 81 MG tablet Take 81 mg by mouth daily.      . calcium-vitamin D (OSCAL 500/200 D-3) 500-200 MG-UNIT per tablet Take 1 tablet by mouth 2 (two) times daily.      Marland Kitchen FeFum-FePo-FA-B Cmp-C-Zn-Mn-Cu (TANDEM PLUS) 162-115.2-1 MG CAPS TAKE 1 CAPSULE DAILY  90 each  0  . fluconazole (DIFLUCAN) 150 MG tablet Take one pill x 1 day and then if persistent symptoms repeat x 1 in three days.  2 tablet  0  . FLUoxetine (PROZAC) 10 MG tablet Take 1 tablet (10 mg total) by mouth daily.  30 tablet  2  . furosemide (LASIX) 20 MG tablet TAKE 1 TABLET DAILY  90 tablet  1  . gabapentin (NEURONTIN) 300 MG capsule Take 300 mg by mouth 4 (four) times daily.      . hydroxychloroquine (PLAQUENIL) 200 MG tablet Take 1 tablet (200 mg total) by mouth 2 (two) times daily.  180 tablet  1  . InFLIXimab (REMICADE IV) Inject every 6 weeks      . metolazone (ZAROXOLYN) 5 MG tablet Take 5 mg by mouth daily.      . metoprolol succinate (TOPROL-XL) 50 MG  24 hr tablet TAKE 1 TABLET DAILY WITH OR IMMEDIATELY FOLLOWING A MEAL  90 tablet  1  . Multiple Vitamin (MULTIVITAMIN) capsule Take 1 capsule by mouth daily.      Marland Kitchen NEXIUM 40 MG capsule TAKE 1 CAPSULE TWICE A DAY  180 capsule  0  . potassium chloride (MICRO-K) 10 MEQ CR capsule Take two tablets bid  360 capsule  1  . risedronate (ACTONEL) 150 MG tablet Take 150 mg by mouth every 30 (thirty) days. with water on empty stomach, nothing by mouth or lie down for next 30 minutes.      . simvastatin (ZOCOR) 20 MG tablet TAKE 1 TABLET EVERY EVENING  90 tablet  1  . sodium chloride (OCEAN) 0.65 % nasal spray Place 1 spray into the nose as needed for congestion.      Marland Kitchen ZYRTEC ALLERGY 10 MG tablet TAKE 1 TABLET DAILY  90 tablet  3   No current facility-administered medications on file prior to visit.    Review of Systems Patient did not report any headache,  lightheadedness or dizziness.  No significant sinus or allergy symptoms.   No chest pain or tightness.  No palpitations.  Breathing stable.  Saw Dr Bary Castilla.  Felt to have a lipoma - chest wall.  Everything checked out fine.  No nausea or vomiting.  No bowel change.  Feels better.  Sleeping better.  Knee better.  Joints stable.        Objective:   Physical Exam  Filed Vitals:   01/08/14 1228  BP: 120/80  Pulse: 64  Temp: 98.2 F (36.8 C)   Blood pressure recheck:  23/7  73 year old female in no acute distress.   HEENT:  Nares- clear.  Oropharynx - without lesions. NECK:  Supple.  Nontender.  No audible bruit.  HEART:  Appears to be regular. LUNGS:  No crackles or wheezing audible.  Respirations even and unlabored.  RADIAL PULSE:  Equal bilaterally.   ABDOMEN:  Soft, nontender.  Bowel sounds present and normal.  No audible abdominal bruit.   EXTREMITIES:  No increased edema present.  DP pulses palpable and equal bilaterally.          Assessment & Plan:  BREAST NODULE.  Saw Dr Bary Castilla.  Lipoma.  No further w/up warranted.  Had f/u mammogram 05/14/13 - Birads II.  Follow up bilateral mammogram 09/16/13 - Birads II.           PULMONARY.  Had a split night sleep study.  Results as outlined.  Avoid sedating medication.  Avoid sleeping supine.  Needs CPAP.   She declines.       HEALTH MAINTENANCE.  Physical 07/30/13.   She is s/p hysterectomy.   Mammogram 11/28/11 recommended additional views.  11/30/11 follow up mammogram - BiRADS II.  Mammogram 2/14 - Birads III.  Referred to Dr Bary Castilla.  Had a f/u mammogram 05/14/13 - Birads II.  Subsequent f/u mammogram 09/16/13 - Birads II.    I spent 25 minutes with the patient and more than 50% of the time was spent in consultation regarding the above.

## 2014-01-12 NOTE — Assessment & Plan Note (Signed)
Abdominal ultrasound revealed fatty liver.  Follow.  Saw GI.  See there note for details.

## 2014-01-12 NOTE — Assessment & Plan Note (Signed)
Was found to have a dilated common bile duct on recent abdominal ultrasound.  Referred to GI for further w/up.  They felt - stable.  Recommended no further w/up.

## 2014-01-12 NOTE — Assessment & Plan Note (Signed)
Doing better.  Feels better.  Sleeping better.  Did not tolerate prozac.  Takes xanax in the evening and this helps her relax.

## 2014-01-12 NOTE — Assessment & Plan Note (Signed)
Low cholesterol diet.  On simvastatin.  Follow lipid profile and liver panel.

## 2014-01-12 NOTE — Assessment & Plan Note (Signed)
Reflux controlled

## 2014-01-12 NOTE — Assessment & Plan Note (Signed)
Is followed by Dr [name].  Stable.  Just had knee aspirated.  Better.  Follow.

## 2014-01-12 NOTE — Assessment & Plan Note (Signed)
Sleep study revealed severe sleep apnea.  Needs CPAP.  She declines.  Have discussed the importance of treating sleep apnea and the risk of untreated sleep apnea.  Also discussed avoiding sedating medications.  She continues to decline CPAP.

## 2014-01-13 DIAGNOSIS — M069 Rheumatoid arthritis, unspecified: Secondary | ICD-10-CM | POA: Diagnosis not present

## 2014-01-22 ENCOUNTER — Other Ambulatory Visit: Payer: Medicare Other

## 2014-01-24 ENCOUNTER — Other Ambulatory Visit (INDEPENDENT_AMBULATORY_CARE_PROVIDER_SITE_OTHER): Payer: Medicare Other

## 2014-01-24 DIAGNOSIS — R945 Abnormal results of liver function studies: Secondary | ICD-10-CM

## 2014-01-24 DIAGNOSIS — D649 Anemia, unspecified: Secondary | ICD-10-CM | POA: Diagnosis not present

## 2014-01-24 DIAGNOSIS — R634 Abnormal weight loss: Secondary | ICD-10-CM

## 2014-01-24 DIAGNOSIS — R7989 Other specified abnormal findings of blood chemistry: Secondary | ICD-10-CM | POA: Diagnosis not present

## 2014-01-24 DIAGNOSIS — I10 Essential (primary) hypertension: Secondary | ICD-10-CM

## 2014-01-24 DIAGNOSIS — E7800 Pure hypercholesterolemia, unspecified: Secondary | ICD-10-CM

## 2014-01-24 LAB — HEPATIC FUNCTION PANEL
ALBUMIN: 3.7 g/dL (ref 3.5–5.2)
ALK PHOS: 47 U/L (ref 39–117)
ALT: 27 U/L (ref 0–35)
AST: 37 U/L (ref 0–37)
Bilirubin, Direct: 0.1 mg/dL (ref 0.0–0.3)
TOTAL PROTEIN: 6.6 g/dL (ref 6.0–8.3)
Total Bilirubin: 0.7 mg/dL (ref 0.2–1.2)

## 2014-01-24 LAB — CBC WITH DIFFERENTIAL/PLATELET
BASOS ABS: 0.1 10*3/uL (ref 0.0–0.1)
Basophils Relative: 1.1 % (ref 0.0–3.0)
EOS ABS: 0.3 10*3/uL (ref 0.0–0.7)
Eosinophils Relative: 5.9 % — ABNORMAL HIGH (ref 0.0–5.0)
HEMATOCRIT: 34.9 % — AB (ref 36.0–46.0)
Hemoglobin: 11.7 g/dL — ABNORMAL LOW (ref 12.0–15.0)
LYMPHS ABS: 2.4 10*3/uL (ref 0.7–4.0)
LYMPHS PCT: 44.8 % (ref 12.0–46.0)
MCHC: 33.6 g/dL (ref 30.0–36.0)
MCV: 93.2 fl (ref 78.0–100.0)
Monocytes Absolute: 0.6 10*3/uL (ref 0.1–1.0)
Monocytes Relative: 11.3 % (ref 3.0–12.0)
NEUTROS ABS: 2 10*3/uL (ref 1.4–7.7)
Neutrophils Relative %: 36.9 % — ABNORMAL LOW (ref 43.0–77.0)
Platelets: 254 10*3/uL (ref 150.0–400.0)
RBC: 3.74 Mil/uL — ABNORMAL LOW (ref 3.87–5.11)
RDW: 13 % (ref 11.5–15.5)
WBC: 5.3 10*3/uL (ref 4.0–10.5)

## 2014-01-24 LAB — LIPID PANEL
Cholesterol: 148 mg/dL (ref 0–200)
HDL: 55.6 mg/dL (ref >39.00)
LDL Cholesterol: 54 mg/dL (ref 0–99)
NONHDL: 92.4
TRIGLYCERIDES: 193 mg/dL — AB (ref 0.0–149.0)
Total CHOL/HDL Ratio: 3
VLDL: 38.6 mg/dL (ref 0.0–40.0)

## 2014-01-24 LAB — BASIC METABOLIC PANEL
BUN: 12 mg/dL (ref 6–23)
CO2: 25 meq/L (ref 19–32)
CREATININE: 1.1 mg/dL (ref 0.4–1.2)
Calcium: 9 mg/dL (ref 8.4–10.5)
Chloride: 100 mEq/L (ref 96–112)
GFR: 49.98 mL/min — AB (ref >60.00)
Glucose, Bld: 99 mg/dL (ref 70–99)
Potassium: 4.5 mEq/L (ref 3.5–5.1)
Sodium: 134 mEq/L — ABNORMAL LOW (ref 135–145)

## 2014-01-24 LAB — TSH: TSH: 4.13 u[IU]/mL (ref 0.35–4.50)

## 2014-01-27 ENCOUNTER — Other Ambulatory Visit: Payer: Self-pay | Admitting: Internal Medicine

## 2014-01-27 DIAGNOSIS — E871 Hypo-osmolality and hyponatremia: Secondary | ICD-10-CM

## 2014-01-27 DIAGNOSIS — D649 Anemia, unspecified: Secondary | ICD-10-CM

## 2014-01-27 DIAGNOSIS — G629 Polyneuropathy, unspecified: Secondary | ICD-10-CM

## 2014-01-27 NOTE — Progress Notes (Signed)
Order placed for f/u labs.  

## 2014-01-29 ENCOUNTER — Encounter: Payer: Self-pay | Admitting: Internal Medicine

## 2014-02-11 ENCOUNTER — Other Ambulatory Visit (INDEPENDENT_AMBULATORY_CARE_PROVIDER_SITE_OTHER): Payer: Medicare Other

## 2014-02-11 DIAGNOSIS — E871 Hypo-osmolality and hyponatremia: Secondary | ICD-10-CM | POA: Diagnosis not present

## 2014-02-11 DIAGNOSIS — D649 Anemia, unspecified: Secondary | ICD-10-CM

## 2014-02-11 LAB — CBC WITH DIFFERENTIAL/PLATELET
Basophils Absolute: 0 10*3/uL (ref 0.0–0.1)
Basophils Relative: 0.6 % (ref 0.0–3.0)
EOS ABS: 0.8 10*3/uL — AB (ref 0.0–0.7)
Eosinophils Relative: 13 % — ABNORMAL HIGH (ref 0.0–5.0)
HCT: 35.4 % — ABNORMAL LOW (ref 36.0–46.0)
Hemoglobin: 12 g/dL (ref 12.0–15.0)
LYMPHS PCT: 33.5 % (ref 12.0–46.0)
Lymphs Abs: 1.9 10*3/uL (ref 0.7–4.0)
MCHC: 33.8 g/dL (ref 30.0–36.0)
MCV: 92.4 fl (ref 78.0–100.0)
MONO ABS: 0.6 10*3/uL (ref 0.1–1.0)
Monocytes Relative: 9.8 % (ref 3.0–12.0)
Neutro Abs: 2.5 10*3/uL (ref 1.4–7.7)
Neutrophils Relative %: 43.1 % (ref 43.0–77.0)
PLATELETS: 211 10*3/uL (ref 150.0–400.0)
RBC: 3.83 Mil/uL — ABNORMAL LOW (ref 3.87–5.11)
RDW: 12.9 % (ref 11.5–15.5)
WBC: 5.8 10*3/uL (ref 4.0–10.5)

## 2014-02-11 LAB — IBC PANEL
IRON: 79 ug/dL (ref 42–145)
Saturation Ratios: 25.4 % (ref 20.0–50.0)
TRANSFERRIN: 221.9 mg/dL (ref 212.0–360.0)

## 2014-02-11 LAB — FERRITIN: Ferritin: 175.5 ng/mL (ref 10.0–291.0)

## 2014-02-11 LAB — SODIUM: SODIUM: 139 meq/L (ref 135–145)

## 2014-02-12 ENCOUNTER — Encounter: Payer: Self-pay | Admitting: *Deleted

## 2014-03-09 ENCOUNTER — Other Ambulatory Visit: Payer: Self-pay | Admitting: Internal Medicine

## 2014-03-10 DIAGNOSIS — M069 Rheumatoid arthritis, unspecified: Secondary | ICD-10-CM | POA: Diagnosis not present

## 2014-03-25 DIAGNOSIS — Z961 Presence of intraocular lens: Secondary | ICD-10-CM | POA: Diagnosis not present

## 2014-04-07 ENCOUNTER — Other Ambulatory Visit: Payer: Self-pay | Admitting: *Deleted

## 2014-04-07 MED ORDER — ALPRAZOLAM 0.25 MG PO TABS: ORAL_TABLET | ORAL | Status: DC

## 2014-04-07 NOTE — Telephone Encounter (Signed)
Refilled xanax #90 with no refills. rx signed.   

## 2014-04-07 NOTE — Telephone Encounter (Signed)
Ok refill? 

## 2014-04-07 NOTE — Telephone Encounter (Signed)
Faxed to pharmacy

## 2014-04-15 ENCOUNTER — Ambulatory Visit: Payer: Medicare Other | Admitting: Internal Medicine

## 2014-04-26 ENCOUNTER — Other Ambulatory Visit: Payer: Self-pay | Admitting: Internal Medicine

## 2014-04-28 ENCOUNTER — Other Ambulatory Visit: Payer: Self-pay | Admitting: Internal Medicine

## 2014-04-30 ENCOUNTER — Ambulatory Visit: Payer: Medicare Other | Admitting: Internal Medicine

## 2014-05-02 ENCOUNTER — Encounter: Payer: Self-pay | Admitting: Internal Medicine

## 2014-05-02 ENCOUNTER — Ambulatory Visit (INDEPENDENT_AMBULATORY_CARE_PROVIDER_SITE_OTHER): Payer: Medicare Other | Admitting: Internal Medicine

## 2014-05-02 VITALS — BP 120/60 | HR 58 | Temp 98.1°F | Ht 64.5 in | Wt 158.5 lb

## 2014-05-02 DIAGNOSIS — E7800 Pure hypercholesterolemia, unspecified: Secondary | ICD-10-CM

## 2014-05-02 DIAGNOSIS — G4733 Obstructive sleep apnea (adult) (pediatric): Secondary | ICD-10-CM | POA: Diagnosis not present

## 2014-05-02 DIAGNOSIS — M069 Rheumatoid arthritis, unspecified: Secondary | ICD-10-CM | POA: Diagnosis not present

## 2014-05-02 DIAGNOSIS — R7989 Other specified abnormal findings of blood chemistry: Secondary | ICD-10-CM | POA: Diagnosis not present

## 2014-05-02 DIAGNOSIS — D649 Anemia, unspecified: Secondary | ICD-10-CM

## 2014-05-02 DIAGNOSIS — R945 Abnormal results of liver function studies: Secondary | ICD-10-CM

## 2014-05-02 DIAGNOSIS — Z658 Other specified problems related to psychosocial circumstances: Secondary | ICD-10-CM | POA: Diagnosis not present

## 2014-05-02 DIAGNOSIS — K219 Gastro-esophageal reflux disease without esophagitis: Secondary | ICD-10-CM | POA: Diagnosis not present

## 2014-05-02 DIAGNOSIS — F439 Reaction to severe stress, unspecified: Secondary | ICD-10-CM

## 2014-05-02 DIAGNOSIS — I10 Essential (primary) hypertension: Secondary | ICD-10-CM

## 2014-05-02 DIAGNOSIS — R634 Abnormal weight loss: Secondary | ICD-10-CM

## 2014-05-02 NOTE — Progress Notes (Signed)
Pre visit review using our clinic review tool, if applicable. No additional management support is needed unless otherwise documented below in the visit note. 

## 2014-05-05 DIAGNOSIS — M0609 Rheumatoid arthritis without rheumatoid factor, multiple sites: Secondary | ICD-10-CM | POA: Diagnosis not present

## 2014-05-11 ENCOUNTER — Encounter: Payer: Self-pay | Admitting: Internal Medicine

## 2014-05-11 NOTE — Progress Notes (Signed)
Subjective:    Patient ID: Kaitlyn Huff, female    DOB: 01/08/1941, 73 y.o.   MRN: [id number]  HPI 73 year old female with past history of hypertension, hypercholesterolemia, GERD, depression and rheumatoid arthritis who comes in today for a scheduled follow up.  She previously had a sleep study.  Did reveal sleep apnea.  Recommended CPAP.  She has never followed through with getting the CPAP.  Have discussed at length with her.   We have discussed the risk of untreated sleep apnea.  Discussed her problems with sleeping.  Discussed that sedating medications can worsen sleep apnea.  She continues to decline CPAP.  She feels her breathing is stable.  No chest pain or tightness.  Joints stable.  Still seeing Dr [name].  Due to ger her next infusion next week.  Saw Dr Bary Castilla for her breast.  Lipoma on chest wall.  She was found to have abnormal liver function.  Abdominal ultrasound revealed an abnormal dilatation of the common bile duct up to 8.24mm and changes that appeared to c/w extreme fatty infiltration.  Saw GI.  Felt no further w/up warranted for the dilated CBD.  Felt stable.  She denies any abdominal pain, nausea or vomiting.  Previously noted some weight loss.  Weight stable from last check.  Handling stress well.  Overall she feels she is doing better.       Past Medical History  Diagnosis Date  . Rheumatoid arthritis(714.0)     MTX transaminitis, Leflunomide (rash), enbrel, plaquinil, prednisone, remicade, Imuran (transaminitis)  . Hyperlipidemia   . Diverticulitis   . Osteoarthritis   . GERD (gastroesophageal reflux disease)   . Hypertension   . Anemia   . Positive PPD     s/p INH (2006)  . Osteoporosis     actonel  . 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 ADVAIR DISKUS 250-50 MCG/DOSE AEPB INHALE 1 PUFF INTO THE LUNGS EVERY 12 (TWELVE) HOURS 1 each 5  . ALPRAZolam  (XANAX) 0.25 MG tablet 1/2 - 1 tablet q day prn 90 tablet 0  . aspirin 81 MG tablet Take 81 mg by mouth daily.    . calcium-vitamin D (OSCAL 500/200 D-3) 500-200 MG-UNIT per tablet Take 1 tablet by mouth 2 (two) times daily.    . cetirizine (ZYRTEC ALLERGY) 10 MG tablet Take 1 tablet (10 mg total) by mouth daily. 90 tablet 3  . esomeprazole (NEXIUM) 40 MG capsule Take 1 capsule (40 mg total) by mouth 2 (two) times daily before a meal. 180 capsule 1  . FeFum-FePo-FA-B Cmp-C-Zn-Mn-Cu (TANDEM PLUS) 162-115.2-1 MG CAPS TAKE 1 CAPSULE DAILY 90 each 1  . FLUoxetine (PROZAC) 10 MG tablet Take 1 tablet (10 mg total) by mouth daily. 30 tablet 2  . furosemide (LASIX) 20 MG tablet TAKE 1 TABLET DAILY 90 tablet 0  . gabapentin (NEURONTIN) 300 MG capsule Take 300 mg by mouth 4 (four) times daily.    . hydroxychloroquine (PLAQUENIL) 200 MG tablet Take 1 tablet (200 mg total) by mouth 2 (two) times daily. 180 tablet 1  . InFLIXimab (REMICADE IV) Inject every 6 weeks    . metolazone (ZAROXOLYN) 5 MG tablet Take 5 mg by mouth daily.    . metoprolol succinate (TOPROL-XL) 50 MG 24 hr tablet Take 1 tablet (50 mg total) by mouth daily. Take with or immediately following a meal.  90 tablet 3  . Multiple Vitamin (MULTIVITAMIN) capsule Take 1 capsule by mouth daily.    . potassium chloride (MICRO-K) 10 MEQ CR capsule TAKE 2 CAPSULES TWICE A DAY 360 capsule 0  . risedronate (ACTONEL) 150 MG tablet Take 150 mg by mouth every 30 (thirty) days. with water on empty stomach, nothing by mouth or lie down for next 30 minutes.    . simvastatin (ZOCOR) 20 MG tablet Take 1 tablet (20 mg total) by mouth daily. 90 tablet 3  . sodium chloride (OCEAN) 0.65 % nasal spray Place 1 spray into the nose as needed for congestion.    . fluconazole (DIFLUCAN) 150 MG tablet Take one pill x 1 day and then if persistent symptoms repeat x 1 in three days. 2 tablet 0   No current facility-administered medications on file prior to visit.    Review  of Systems Patient did not report any headache, lightheadedness or dizziness.  No significant sinus or allergy symptoms.   No chest pain or tightness.  No palpitations.  Breathing stable.  Saw Dr Bary Castilla.  Felt to have a lipoma - chest wall.  Everything checked out fine.  No nausea or vomiting.  No bowel change.   Had sleep study.  Results as outlined.  Needs to f/u with CPAP.  Have discussed with her.  She declines.  Handling stress well.  Feels better.  Weight stable.           Objective:   Physical Exam  Filed Vitals:   05/02/14 0957  BP: 120/60  Pulse: 58  Temp: 98.1 F (36.7 C)   Blood pressure recheck:  41/63  73 year old female in no acute distress.   HEENT:  Nares- clear.  Oropharynx - without lesions. NECK:  Supple.  Nontender.  No audible bruit.  HEART:  Appears to be regular. LUNGS:  No crackles or wheezing audible.  Respirations even and unlabored.  RADIAL PULSE:  Equal bilaterally. ABDOMEN:  Soft, nontender.  Bowel sounds present and normal.  No audible abdominal bruit.  EXTREMITIES:  No increased edema present.  DP pulses palpable and equal bilaterally.          Assessment & Plan:  BREAST NODULE.  Saw Dr Bary Castilla.  Lipoma.  No further w/up warranted.  Had f/u mammogram 05/14/13 - Birads II.  09/16/13 mammogram - Birads I.     PREVIOUS DIFFICULTY SLEEPING.  Have discussed at length with her.  Have discussed the need for treatment of her sleep apnea before prescribing sedating medications.  She declines CPAP.  Appears to be sleeping better.  On prozac.         PULMONARY.  Had a split night sleep study.  Results as outlined.  Avoid sedating medication.  Avoid sleeping supine.  Needs CPAP.   She declines.    Essential hypertension Blood pressure doing well.  Follow.  Follow metabolic panel.   Obstructive sleep apnea Declines CPAP.  See above.   Gastroesophageal reflux disease, esophagitis presence not specified Controlled.  On nexium.   Rheumatoid arthritis Stable.   Seeing Dr [name].  Due infusion next week.   Hypercholesterolemia Low cholesterol diet.  On simvastatin.  Follow lipid panel and liver function.    Lab Results  Component Value Date   CHOL 148 01/24/2014   HDL 55.60 01/24/2014   LDLCALC 54 01/24/2014   LDLDIRECT 73.7 08/20/2013   TRIG 193.0* 01/24/2014   CHOLHDL 3 01/24/2014   Anemia, unspecified anemia type Hgb last checked  02/11/14 - wnl - 12.    Abnormal liver function test 01/24/14 liver panel - wnl.  Follow.    Stress Doing better.  Takes xanax prn. On prozac.  Follow.    Weight loss Weight stable.       HEALTH MAINTENANCE.  Physical 07/30/13.   She is s/p hysterectomy.   Mammogram 11/28/11 recommended additional views.  11/30/11 follow up mammogram - BiRADS II.  Mammogram 2/14 - Birads III.  Referred to Dr Bary Castilla.  Had a f/u mammogram 05/14/13 - Birads II.  Follow up mammogram 09/16/13 - Birads I.

## 2014-06-20 DIAGNOSIS — I059 Rheumatic mitral valve disease, unspecified: Secondary | ICD-10-CM | POA: Diagnosis not present

## 2014-06-20 DIAGNOSIS — I38 Endocarditis, valve unspecified: Secondary | ICD-10-CM | POA: Diagnosis not present

## 2014-06-20 DIAGNOSIS — E782 Mixed hyperlipidemia: Secondary | ICD-10-CM | POA: Diagnosis not present

## 2014-06-20 DIAGNOSIS — I10 Essential (primary) hypertension: Secondary | ICD-10-CM | POA: Diagnosis not present

## 2014-06-21 ENCOUNTER — Inpatient Hospital Stay: Payer: Self-pay | Admitting: Surgery

## 2014-06-21 DIAGNOSIS — Z01810 Encounter for preprocedural cardiovascular examination: Secondary | ICD-10-CM | POA: Diagnosis not present

## 2014-06-21 DIAGNOSIS — K8012 Calculus of gallbladder with acute and chronic cholecystitis without obstruction: Secondary | ICD-10-CM | POA: Diagnosis not present

## 2014-06-21 DIAGNOSIS — Z881 Allergy status to other antibiotic agents status: Secondary | ICD-10-CM | POA: Diagnosis not present

## 2014-06-21 DIAGNOSIS — M47892 Other spondylosis, cervical region: Secondary | ICD-10-CM | POA: Diagnosis present

## 2014-06-21 DIAGNOSIS — Z79899 Other long term (current) drug therapy: Secondary | ICD-10-CM | POA: Diagnosis not present

## 2014-06-21 DIAGNOSIS — R296 Repeated falls: Secondary | ICD-10-CM | POA: Diagnosis not present

## 2014-06-21 DIAGNOSIS — K810 Acute cholecystitis: Secondary | ICD-10-CM | POA: Diagnosis not present

## 2014-06-21 DIAGNOSIS — R112 Nausea with vomiting, unspecified: Secondary | ICD-10-CM | POA: Diagnosis not present

## 2014-06-21 DIAGNOSIS — Z885 Allergy status to narcotic agent status: Secondary | ICD-10-CM | POA: Diagnosis not present

## 2014-06-21 DIAGNOSIS — M069 Rheumatoid arthritis, unspecified: Secondary | ICD-10-CM | POA: Diagnosis present

## 2014-06-21 DIAGNOSIS — M542 Cervicalgia: Secondary | ICD-10-CM | POA: Diagnosis not present

## 2014-06-21 DIAGNOSIS — R05 Cough: Secondary | ICD-10-CM | POA: Diagnosis not present

## 2014-06-21 DIAGNOSIS — E860 Dehydration: Secondary | ICD-10-CM | POA: Diagnosis present

## 2014-06-21 DIAGNOSIS — K838 Other specified diseases of biliary tract: Secondary | ICD-10-CM | POA: Diagnosis not present

## 2014-06-21 DIAGNOSIS — I10 Essential (primary) hypertension: Secondary | ICD-10-CM | POA: Diagnosis present

## 2014-06-21 DIAGNOSIS — R51 Headache: Secondary | ICD-10-CM | POA: Diagnosis not present

## 2014-06-21 DIAGNOSIS — R55 Syncope and collapse: Secondary | ICD-10-CM | POA: Diagnosis not present

## 2014-06-21 DIAGNOSIS — Z882 Allergy status to sulfonamides status: Secondary | ICD-10-CM | POA: Diagnosis not present

## 2014-06-21 DIAGNOSIS — I340 Nonrheumatic mitral (valve) insufficiency: Secondary | ICD-10-CM | POA: Diagnosis present

## 2014-06-21 DIAGNOSIS — R509 Fever, unspecified: Secondary | ICD-10-CM | POA: Diagnosis not present

## 2014-06-21 DIAGNOSIS — R110 Nausea: Secondary | ICD-10-CM | POA: Diagnosis not present

## 2014-06-21 DIAGNOSIS — Z7982 Long term (current) use of aspirin: Secondary | ICD-10-CM | POA: Diagnosis not present

## 2014-06-21 DIAGNOSIS — Z880 Allergy status to penicillin: Secondary | ICD-10-CM | POA: Diagnosis not present

## 2014-06-21 DIAGNOSIS — K8020 Calculus of gallbladder without cholecystitis without obstruction: Secondary | ICD-10-CM | POA: Diagnosis not present

## 2014-06-21 LAB — COMPREHENSIVE METABOLIC PANEL
ALK PHOS: 98 U/L
ANION GAP: 8 (ref 7–16)
Albumin: 3.4 g/dL (ref 3.4–5.0)
BILIRUBIN TOTAL: 0.9 mg/dL (ref 0.2–1.0)
BUN: 17 mg/dL (ref 7–18)
CHLORIDE: 102 mmol/L (ref 98–107)
Calcium, Total: 8.3 mg/dL — ABNORMAL LOW (ref 8.5–10.1)
Co2: 27 mmol/L (ref 21–32)
Creatinine: 1.08 mg/dL (ref 0.60–1.30)
EGFR (African American): >60
EGFR (Non-African Amer.): 53 — ABNORMAL LOW
Glucose: 106 mg/dL — ABNORMAL HIGH (ref 65–99)
Osmolality: 276 (ref 275–301)
POTASSIUM: 3.6 mmol/L (ref 3.5–5.1)
SGOT(AST): 76 U/L — ABNORMAL HIGH (ref 15–37)
SGPT (ALT): 63 U/L
SODIUM: 137 mmol/L (ref 136–145)
TOTAL PROTEIN: 7.1 g/dL (ref 6.4–8.2)

## 2014-06-21 LAB — URINALYSIS, COMPLETE
BLOOD: NEGATIVE
Bacteria: NONE SEEN
Bilirubin,UR: NEGATIVE
Glucose,UR: NEGATIVE mg/dL (ref 0–75)
Hyaline Cast: 3
Ketone: NEGATIVE
LEUKOCYTE ESTERASE: NEGATIVE
Nitrite: NEGATIVE
PH: 6 (ref 4.5–8.0)
Protein: 30
Specific Gravity: 1.02 (ref 1.003–1.030)
Squamous Epithelial: 3
WBC UR: 3 /HPF (ref 0–5)

## 2014-06-21 LAB — CK TOTAL AND CKMB (NOT AT ARMC)
CK, Total: 200 U/L — ABNORMAL HIGH (ref 26–192)
CK-MB: 1.7 ng/mL (ref 0.5–3.6)

## 2014-06-21 LAB — CBC
HCT: 35.2 % (ref 35.0–47.0)
HGB: 11.9 g/dL — ABNORMAL LOW (ref 12.0–16.0)
MCH: 30.9 pg (ref 26.0–34.0)
MCHC: 33.7 g/dL (ref 32.0–36.0)
MCV: 92 fL (ref 80–100)
PLATELETS: 170 10*3/uL (ref 150–440)
RBC: 3.83 10*6/uL (ref 3.80–5.20)
RDW: 12.6 % (ref 11.5–14.5)
WBC: 17.6 10*3/uL — AB (ref 3.6–11.0)

## 2014-06-21 LAB — INFLUENZA A,B,H1N1 - PCR (ARMC)
H1N1 flu by pcr: NOT DETECTED
INFLAPCR: NEGATIVE
Influenza B By PCR: NEGATIVE

## 2014-06-21 LAB — TROPONIN I: Troponin-I: <0.02 ng/mL

## 2014-06-22 HISTORY — PX: CHOLECYSTECTOMY: SHX55

## 2014-06-22 LAB — CBC WITH DIFFERENTIAL/PLATELET
BASOS ABS: 0 10*3/uL (ref 0.0–0.1)
Basophil %: 0.3 %
EOS ABS: 0.3 10*3/uL (ref 0.0–0.7)
Eosinophil %: 4 %
HCT: 30.4 % — ABNORMAL LOW (ref 35.0–47.0)
HGB: 10.3 g/dL — ABNORMAL LOW (ref 12.0–16.0)
Lymphocyte #: 1.1 10*3/uL (ref 1.0–3.6)
Lymphocyte %: 16.3 %
MCH: 31.4 pg (ref 26.0–34.0)
MCHC: 33.9 g/dL (ref 32.0–36.0)
MCV: 93 fL (ref 80–100)
MONO ABS: 0.6 x10 3/mm (ref 0.2–0.9)
Monocyte %: 9.3 %
NEUTROS ABS: 4.7 10*3/uL (ref 1.4–6.5)
Neutrophil %: 70.1 %
Platelet: 123 10*3/uL — ABNORMAL LOW (ref 150–440)
RBC: 3.28 10*6/uL — AB (ref 3.80–5.20)
RDW: 12.6 % (ref 11.5–14.5)
WBC: 6.6 10*3/uL (ref 3.6–11.0)

## 2014-06-22 LAB — COMPREHENSIVE METABOLIC PANEL
ALT: 43 U/L
AST: 44 U/L — AB (ref 15–37)
Albumin: 2.8 g/dL — ABNORMAL LOW (ref 3.4–5.0)
Alkaline Phosphatase: 77 U/L
Anion Gap: 5 — ABNORMAL LOW (ref 7–16)
BILIRUBIN TOTAL: 1 mg/dL (ref 0.2–1.0)
BUN: 19 mg/dL — AB (ref 7–18)
CHLORIDE: 104 mmol/L (ref 98–107)
CREATININE: 1.15 mg/dL (ref 0.60–1.30)
Calcium, Total: 7.8 mg/dL — ABNORMAL LOW (ref 8.5–10.1)
Co2: 28 mmol/L (ref 21–32)
GFR CALC AF AMER: 60 — AB
GFR CALC NON AF AMER: 49 — AB
Glucose: 103 mg/dL — ABNORMAL HIGH (ref 65–99)
Osmolality: 276 (ref 275–301)
Potassium: 3.6 mmol/L (ref 3.5–5.1)
Sodium: 137 mmol/L (ref 136–145)
TOTAL PROTEIN: 6 g/dL — AB (ref 6.4–8.2)

## 2014-06-23 LAB — CBC WITH DIFFERENTIAL/PLATELET
BASOS PCT: 0.1 %
Basophil #: 0 10*3/uL (ref 0.0–0.1)
Eosinophil #: 0 10*3/uL (ref 0.0–0.7)
Eosinophil %: 0.1 %
HCT: 28 % — AB (ref 35.0–47.0)
HGB: 9.5 g/dL — AB (ref 12.0–16.0)
Lymphocyte #: 0.7 10*3/uL — ABNORMAL LOW (ref 1.0–3.6)
Lymphocyte %: 7.9 %
MCH: 31.2 pg (ref 26.0–34.0)
MCHC: 34 g/dL (ref 32.0–36.0)
MCV: 92 fL (ref 80–100)
Monocyte #: 0.5 x10 3/mm (ref 0.2–0.9)
Monocyte %: 5.4 %
Neutrophil #: 7.6 10*3/uL — ABNORMAL HIGH (ref 1.4–6.5)
Neutrophil %: 86.5 %
PLATELETS: 119 10*3/uL — AB (ref 150–440)
RBC: 3.06 10*6/uL — AB (ref 3.80–5.20)
RDW: 12.3 % (ref 11.5–14.5)
WBC: 8.7 10*3/uL (ref 3.6–11.0)

## 2014-06-23 LAB — HEPATIC FUNCTION PANEL A (ARMC)
ALT: 44 U/L
AST: 42 U/L — AB (ref 15–37)
Albumin: 2.7 g/dL — ABNORMAL LOW (ref 3.4–5.0)
Alkaline Phosphatase: 73 U/L
BILIRUBIN DIRECT: 0.2 mg/dL (ref 0.0–0.2)
Bilirubin,Total: 0.6 mg/dL (ref 0.2–1.0)
Total Protein: 6.3 g/dL — ABNORMAL LOW (ref 6.4–8.2)

## 2014-06-23 LAB — BASIC METABOLIC PANEL
ANION GAP: 7 (ref 7–16)
BUN: 24 mg/dL — ABNORMAL HIGH (ref 7–18)
Calcium, Total: 8 mg/dL — ABNORMAL LOW (ref 8.5–10.1)
Chloride: 103 mmol/L (ref 98–107)
Co2: 25 mmol/L (ref 21–32)
Creatinine: 1.25 mg/dL (ref 0.60–1.30)
EGFR (African American): 54 — ABNORMAL LOW
EGFR (Non-African Amer.): 45 — ABNORMAL LOW
Glucose: 150 mg/dL — ABNORMAL HIGH (ref 65–99)
Osmolality: 277 (ref 275–301)
POTASSIUM: 4.2 mmol/L (ref 3.5–5.1)
Sodium: 135 mmol/L — ABNORMAL LOW (ref 136–145)

## 2014-06-23 LAB — CULTURE, BLOOD (SINGLE)

## 2014-06-23 LAB — LIPASE, BLOOD: Lipase: 358 U/L (ref 73–393)

## 2014-06-24 ENCOUNTER — Other Ambulatory Visit: Payer: Self-pay | Admitting: *Deleted

## 2014-06-26 LAB — CULTURE, BLOOD (SINGLE)

## 2014-07-07 ENCOUNTER — Other Ambulatory Visit: Payer: Self-pay | Admitting: *Deleted

## 2014-07-07 MED ORDER — ALPRAZOLAM 0.25 MG PO TABS: ORAL_TABLET | ORAL | Status: DC

## 2014-07-07 NOTE — Telephone Encounter (Signed)
ok'd refill for xanax .25mg  #90 with  No refills.

## 2014-07-07 NOTE — Telephone Encounter (Signed)
Ok refill? 

## 2014-07-09 ENCOUNTER — Other Ambulatory Visit: Payer: Self-pay | Admitting: Internal Medicine

## 2014-07-26 ENCOUNTER — Other Ambulatory Visit: Payer: Self-pay | Admitting: Internal Medicine

## 2014-07-28 ENCOUNTER — Telehealth: Payer: Self-pay | Admitting: *Deleted

## 2014-07-28 ENCOUNTER — Other Ambulatory Visit (INDEPENDENT_AMBULATORY_CARE_PROVIDER_SITE_OTHER): Payer: Medicare Other

## 2014-07-28 DIAGNOSIS — I10 Essential (primary) hypertension: Secondary | ICD-10-CM | POA: Diagnosis not present

## 2014-07-28 DIAGNOSIS — R945 Abnormal results of liver function studies: Secondary | ICD-10-CM

## 2014-07-28 DIAGNOSIS — D649 Anemia, unspecified: Secondary | ICD-10-CM | POA: Diagnosis not present

## 2014-07-28 DIAGNOSIS — R7989 Other specified abnormal findings of blood chemistry: Secondary | ICD-10-CM | POA: Diagnosis not present

## 2014-07-28 DIAGNOSIS — M069 Rheumatoid arthritis, unspecified: Secondary | ICD-10-CM

## 2014-07-28 DIAGNOSIS — E7800 Pure hypercholesterolemia, unspecified: Secondary | ICD-10-CM

## 2014-07-28 LAB — CBC WITH DIFFERENTIAL/PLATELET
BASOS ABS: 0 10*3/uL (ref 0.0–0.1)
BASOS PCT: 0.3 % (ref 0.0–3.0)
EOS ABS: 0.2 10*3/uL (ref 0.0–0.7)
Eosinophils Relative: 2 % (ref 0.0–5.0)
HCT: 31.7 % — ABNORMAL LOW (ref 36.0–46.0)
Hemoglobin: 10.4 g/dL — ABNORMAL LOW (ref 12.0–15.0)
LYMPHS ABS: 2.6 10*3/uL (ref 0.7–4.0)
Lymphocytes Relative: 31.4 % (ref 12.0–46.0)
MCHC: 32.8 g/dL (ref 30.0–36.0)
MCV: 90.6 fl (ref 78.0–100.0)
MONO ABS: 0.6 10*3/uL (ref 0.1–1.0)
Monocytes Relative: 7.8 % (ref 3.0–12.0)
NEUTROS PCT: 58.5 % (ref 43.0–77.0)
Neutro Abs: 4.8 10*3/uL (ref 1.4–7.7)
PLATELETS: 266 10*3/uL (ref 150.0–400.0)
RBC: 3.5 Mil/uL — ABNORMAL LOW (ref 3.87–5.11)
RDW: 13.2 % (ref 11.5–15.5)
WBC: 8.2 10*3/uL (ref 4.0–10.5)

## 2014-07-28 LAB — LIPID PANEL
CHOLESTEROL: 133 mg/dL (ref 0–200)
HDL: 56.2 mg/dL (ref >39.00)
LDL Cholesterol: 51 mg/dL (ref 0–99)
NonHDL: 76.8
Total CHOL/HDL Ratio: 2
Triglycerides: 130 mg/dL (ref 0.0–149.0)
VLDL: 26 mg/dL (ref 0.0–40.0)

## 2014-07-28 LAB — BASIC METABOLIC PANEL
BUN: 18 mg/dL (ref 6–23)
CO2: 29 meq/L (ref 19–32)
Calcium: 9.3 mg/dL (ref 8.4–10.5)
Chloride: 101 mEq/L (ref 96–112)
Creatinine, Ser: 0.93 mg/dL (ref 0.40–1.20)
GFR: 63.12 mL/min (ref >60.00)
Glucose, Bld: 82 mg/dL (ref 70–99)
Potassium: 4.5 mEq/L (ref 3.5–5.1)
Sodium: 137 mEq/L (ref 135–145)

## 2014-07-28 LAB — HEPATIC FUNCTION PANEL
ALBUMIN: 3.7 g/dL (ref 3.5–5.2)
ALK PHOS: 67 U/L (ref 39–117)
ALT: 12 U/L (ref 0–35)
AST: 20 U/L (ref 0–37)
Bilirubin, Direct: 0.1 mg/dL (ref 0.0–0.3)
Total Bilirubin: 0.4 mg/dL (ref 0.2–1.2)
Total Protein: 7 g/dL (ref 6.0–8.3)

## 2014-07-28 LAB — FERRITIN: Ferritin: 210 ng/mL (ref 10.0–291.0)

## 2014-07-28 NOTE — Telephone Encounter (Signed)
What labs and dx?  

## 2014-07-28 NOTE — Telephone Encounter (Signed)
Orders placed for labs

## 2014-07-29 ENCOUNTER — Other Ambulatory Visit (INDEPENDENT_AMBULATORY_CARE_PROVIDER_SITE_OTHER): Payer: Medicare Other

## 2014-07-29 ENCOUNTER — Other Ambulatory Visit: Payer: Self-pay | Admitting: Internal Medicine

## 2014-07-29 DIAGNOSIS — D649 Anemia, unspecified: Secondary | ICD-10-CM

## 2014-07-29 LAB — VITAMIN B12: VITAMIN B 12: 388 pg/mL (ref 211–911)

## 2014-07-29 LAB — IBC PANEL
IRON: 69 ug/dL (ref 42–145)
Saturation Ratios: 23.9 % (ref 20.0–50.0)
TRANSFERRIN: 206 mg/dL — AB (ref 212.0–360.0)

## 2014-07-29 NOTE — Progress Notes (Signed)
Order placed for add on labs.   °

## 2014-07-30 ENCOUNTER — Encounter: Payer: Self-pay | Admitting: Internal Medicine

## 2014-07-30 ENCOUNTER — Ambulatory Visit (INDEPENDENT_AMBULATORY_CARE_PROVIDER_SITE_OTHER): Payer: Medicare Other | Admitting: Internal Medicine

## 2014-07-30 ENCOUNTER — Encounter: Payer: Self-pay | Admitting: *Deleted

## 2014-07-30 VITALS — BP 130/70 | HR 56 | Temp 97.9°F | Ht 64.0 in | Wt 151.2 lb

## 2014-07-30 DIAGNOSIS — K219 Gastro-esophageal reflux disease without esophagitis: Secondary | ICD-10-CM

## 2014-07-30 DIAGNOSIS — D649 Anemia, unspecified: Secondary | ICD-10-CM | POA: Diagnosis not present

## 2014-07-30 DIAGNOSIS — R634 Abnormal weight loss: Secondary | ICD-10-CM | POA: Diagnosis not present

## 2014-07-30 DIAGNOSIS — Z658 Other specified problems related to psychosocial circumstances: Secondary | ICD-10-CM

## 2014-07-30 DIAGNOSIS — F439 Reaction to severe stress, unspecified: Secondary | ICD-10-CM

## 2014-07-30 DIAGNOSIS — E7800 Pure hypercholesterolemia, unspecified: Secondary | ICD-10-CM

## 2014-07-30 DIAGNOSIS — I10 Essential (primary) hypertension: Secondary | ICD-10-CM | POA: Diagnosis not present

## 2014-07-30 DIAGNOSIS — G4733 Obstructive sleep apnea (adult) (pediatric): Secondary | ICD-10-CM

## 2014-07-30 DIAGNOSIS — M069 Rheumatoid arthritis, unspecified: Secondary | ICD-10-CM | POA: Diagnosis not present

## 2014-07-30 LAB — CBC WITH DIFFERENTIAL/PLATELET
BASOS ABS: 0 10*3/uL (ref 0.0–0.1)
Basophils Relative: 0.3 % (ref 0.0–3.0)
Eosinophils Absolute: 0.1 10*3/uL (ref 0.0–0.7)
Eosinophils Relative: 1 % (ref 0.0–5.0)
HCT: 33.8 % — ABNORMAL LOW (ref 36.0–46.0)
Hemoglobin: 11.5 g/dL — ABNORMAL LOW (ref 12.0–15.0)
LYMPHS PCT: 13.6 % (ref 12.0–46.0)
Lymphs Abs: 1.3 10*3/uL (ref 0.7–4.0)
MCHC: 34.1 g/dL (ref 30.0–36.0)
MCV: 88.6 fl (ref 78.0–100.0)
MONOS PCT: 3.5 % (ref 3.0–12.0)
Monocytes Absolute: 0.3 10*3/uL (ref 0.1–1.0)
NEUTROS PCT: 81.6 % — AB (ref 43.0–77.0)
Neutro Abs: 7.8 10*3/uL — ABNORMAL HIGH (ref 1.4–7.7)
PLATELETS: 287 10*3/uL (ref 150.0–400.0)
RBC: 3.81 Mil/uL — ABNORMAL LOW (ref 3.87–5.11)
RDW: 13.5 % (ref 11.5–15.5)
WBC: 9.5 10*3/uL (ref 4.0–10.5)

## 2014-07-30 NOTE — Progress Notes (Signed)
Pre visit review using our clinic review tool, if applicable. No additional management support is needed unless otherwise documented below in the visit note. 

## 2014-08-01 ENCOUNTER — Other Ambulatory Visit: Payer: Self-pay | Admitting: Internal Medicine

## 2014-08-03 ENCOUNTER — Encounter: Payer: Self-pay | Admitting: Internal Medicine

## 2014-08-03 NOTE — Progress Notes (Signed)
Subjective:    Patient ID: Kaitlyn Huff, female    DOB: 14-Jan-1941, 74 y.o.   MRN: [id number]  HPI 74 year old female with past history of hypertension, hypercholesterolemia, GERD, depression and rheumatoid arthritis who comes in today to follow up on these issues as well as for a complete physical exam.   She previously had a sleep study.  Did reveal sleep apnea.  Recommended CPAP.  She has never followed through with getting the CPAP.  Have discussed at length with her.   We have discussed the risk of untreated sleep apnea.  Discussed her problems with sleeping.  Discussed that sedating medications can worsen sleep apnea.  She continues to decline CPAP.  She feels her breathing is stable.  No chest pain or tightness.  Just had cholecystectomy secondary to acute cholecystitis.  Had to miss her infusion.  Had wrist flares.  Still seeing Dr [name].  She is now on two prednisone per day.  Saw Dr Bary Castilla for her breast.  Lipoma on chest wall.  She was found to have abnormal liver function.  W/up with abdominal ultrasound as outlined last note.  Saw GI.  Felt stable.  She denies any abdominal pain, nausea or vomiting.  Still some decreased appetite - since surgery.  Previously noted some weight loss.  Weight down more from last check.  Handling stress well.       Past Medical History  Diagnosis Date  . Rheumatoid arthritis(714.0)     MTX transaminitis, Leflunomide (rash), enbrel, plaquinil, prednisone, remicade, Imuran (transaminitis)  . Hyperlipidemia   . Diverticulitis   . Osteoarthritis   . GERD (gastroesophageal reflux disease)   . Hypertension   . Anemia   . Positive PPD     s/p INH (2006)  . Osteoporosis     actonel  . 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 ADVAIR DISKUS 250-50 MCG/DOSE AEPB INHALE 1 PUFF INTO THE LUNGS EVERY 12 (TWELVE) HOURS 1 each 5  . ALPRAZolam (XANAX) 0.25 MG  tablet 1/2 - 1 tablet q day prn 90 tablet 0  . aspirin 81 MG tablet Take 81 mg by mouth daily.    . calcium-vitamin D (OSCAL 500/200 D-3) 500-200 MG-UNIT per tablet Take 1 tablet by mouth 2 (two) times daily.    . cetirizine (ZYRTEC ALLERGY) 10 MG tablet Take 1 tablet (10 mg total) by mouth daily. 90 tablet 3  . FeFum-FePo-FA-B Cmp-C-Zn-Mn-Cu (TANDEM PLUS) 162-115.2-1 MG CAPS TAKE 1 CAPSULE DAILY 90 each 1  . fluconazole (DIFLUCAN) 150 MG tablet Take one pill x 1 day and then if persistent symptoms repeat x 1 in three days. 2 tablet 0  . FLUoxetine (PROZAC) 10 MG tablet Take 1 tablet (10 mg total) by mouth daily. 30 tablet 2  . furosemide (LASIX) 20 MG tablet TAKE 1 TABLET DAILY 90 tablet 1  . gabapentin (NEURONTIN) 300 MG capsule Take 300 mg by mouth 4 (four) times daily.    . hydroxychloroquine (PLAQUENIL) 200 MG tablet Take 1 tablet (200 mg total) by mouth 2 (two) times daily. 180 tablet 1  . InFLIXimab (REMICADE IV) Inject every 6 weeks    . metolazone (ZAROXOLYN) 5 MG tablet Take 5 mg by mouth daily.    . metoprolol succinate (TOPROL-XL) 50 MG 24 hr tablet Take 1 tablet (50 mg total) by mouth daily. Take with or immediately  following a meal. 90 tablet 3  . Multiple Vitamin (MULTIVITAMIN) capsule Take 1 capsule by mouth daily.    Marland Kitchen NEXIUM 40 MG capsule TAKE 1 CAPSULE (40 MG TOTAL) TWICE A DAY BEFORE A MEAL 180 capsule 3  . potassium chloride (MICRO-K) 10 MEQ CR capsule TAKE 2 CAPSULES TWICE A DAY 360 capsule 1  . risedronate (ACTONEL) 150 MG tablet Take 150 mg by mouth every 30 (thirty) days. with water on empty stomach, nothing by mouth or lie down for next 30 minutes.    . simvastatin (ZOCOR) 20 MG tablet Take 1 tablet (20 mg total) by mouth daily. 90 tablet 3  . sodium chloride (OCEAN) 0.65 % nasal spray Place 1 spray into the nose as needed for congestion.     No current facility-administered medications on file prior to visit.    Review of Systems Patient did not report any headache,  lightheadedness or dizziness.  No significant sinus or allergy symptoms.   No chest pain or tightness.  No palpitations.  Breathing stable.  Saw Dr Bary Castilla.  Felt to have a lipoma - chest wall.  Everything checked out fine.  recently had her gallbladder removed.  Doing well.  Still with some decreased appetite.   No nausea or vomiting.  No significant bowel change.  Some weight loss.   Had sleep study.  Results as outlined. Needs to f/u with CPAP.  Have discussed with her.  She declines.  Handling stress well. Recent wrist flares.  On prednisone.  Doing better.  Taking a probiotic.           Objective:   Physical Exam  Filed Vitals:   07/30/14 1041  BP: 130/70  Pulse: 56  Temp: 97.9 F (22.64 C)   74 year old female in no acute distress.   HEENT:  Nares- clear.  Oropharynx - without lesions. NECK:  Supple.  Nontender.  No audible bruit.  HEART:  Appears to be regular. LUNGS:  No crackles or wheezing audible.  Respirations even and unlabored.  RADIAL PULSE:  Equal bilaterally.    BREASTS:  No nipple discharge or nipple retraction present.  Could not appreciate any distinct nodules or axillary adenopathy.  ABDOMEN:  Soft, nontender.  Bowel sounds present and normal.  No audible abdominal bruit.  GU:  Not performed.   EXTREMITIES:  No increased edema present.  DP pulses palpable and equal bilaterally.          Assessment & Plan:  1. Anemia, unspecified anemia type Recent hgb decreased more than previous checks.  Recheck today.  May be multifactorial.  Had recent surgery.  Recent rheumatoid flare.   - CBC with Differential - Fecal occult blood, imunochemical; Future  2. Essential hypertension Blood pressure doing well.  Follow.  Same medication regimen.    3. Obstructive sleep apnea Declines CPAP.  See below.    4. Gastroesophageal reflux disease, esophagitis presence not specified Controlled on nexium.    5. Rheumatoid arthritis Recent flare of her wrists.  On prednisone.   Better.  Seeing Dr [name].    6. Stress Feels she is handling things relatively well.  On prozac.    7. Hypercholesterolemia Low cholesterol diet and exercise.  On simvastatin.  Follow lipid panel and liver function tests.   8. Weight loss Weight down more.  Recent surgery.  Still with decreased appetite.  Follow.    9. BREAST NODULE.  Saw Dr Bary Castilla.  Lipoma.  No further w/up warranted.  Had f/u  mammogram 05/14/13 - Birads II.  09/16/13 mammogram - Birads I.     10. PREVIOUS DIFFICULTY SLEEPING.  Have discussed at length with her.  Have discussed the need for treatment of her sleep apnea before prescribing sedating medications.  She declines CPAP.  Appears to be sleeping better.  On prozac.         11. PULMONARY.  Had a split night sleep study.  Results as outlined.  Avoid sedating medication.  Avoid sleeping supine.  Needs CPAP.   She declines.    HEALTH MAINTENANCE.  Physical 07/30/13.   She is s/p hysterectomy.   Mammogram 11/28/11 recommended additional views.  11/30/11 follow up mammogram - BiRADS II.  Mammogram 2/14 - Birads III.  Referred to Dr Bary Castilla.  Had a f/u mammogram 05/14/13 - Birads II.  Follow up mammogram 09/16/13 - Birads I.

## 2014-08-11 DIAGNOSIS — R0602 Shortness of breath: Secondary | ICD-10-CM | POA: Diagnosis not present

## 2014-08-11 DIAGNOSIS — I059 Rheumatic mitral valve disease, unspecified: Secondary | ICD-10-CM | POA: Diagnosis not present

## 2014-08-12 DIAGNOSIS — M0579 Rheumatoid arthritis with rheumatoid factor of multiple sites without organ or systems involvement: Secondary | ICD-10-CM | POA: Diagnosis not present

## 2014-08-20 ENCOUNTER — Encounter: Payer: Self-pay | Admitting: Internal Medicine

## 2014-08-25 ENCOUNTER — Telehealth: Payer: Self-pay | Admitting: Internal Medicine

## 2014-08-25 ENCOUNTER — Inpatient Hospital Stay: Payer: Self-pay | Admitting: Internal Medicine

## 2014-08-25 DIAGNOSIS — K219 Gastro-esophageal reflux disease without esophagitis: Secondary | ICD-10-CM | POA: Diagnosis present

## 2014-08-25 DIAGNOSIS — I517 Cardiomegaly: Secondary | ICD-10-CM | POA: Diagnosis not present

## 2014-08-25 DIAGNOSIS — R197 Diarrhea, unspecified: Secondary | ICD-10-CM | POA: Diagnosis not present

## 2014-08-25 DIAGNOSIS — J811 Chronic pulmonary edema: Secondary | ICD-10-CM | POA: Diagnosis not present

## 2014-08-25 DIAGNOSIS — K760 Fatty (change of) liver, not elsewhere classified: Secondary | ICD-10-CM | POA: Diagnosis not present

## 2014-08-25 DIAGNOSIS — E869 Volume depletion, unspecified: Secondary | ICD-10-CM | POA: Diagnosis not present

## 2014-08-25 DIAGNOSIS — Z881 Allergy status to other antibiotic agents status: Secondary | ICD-10-CM | POA: Diagnosis not present

## 2014-08-25 DIAGNOSIS — Z79899 Other long term (current) drug therapy: Secondary | ICD-10-CM | POA: Diagnosis not present

## 2014-08-25 DIAGNOSIS — R1110 Vomiting, unspecified: Secondary | ICD-10-CM | POA: Diagnosis not present

## 2014-08-25 DIAGNOSIS — E785 Hyperlipidemia, unspecified: Secondary | ICD-10-CM | POA: Diagnosis not present

## 2014-08-25 DIAGNOSIS — K529 Noninfective gastroenteritis and colitis, unspecified: Secondary | ICD-10-CM | POA: Diagnosis present

## 2014-08-25 DIAGNOSIS — I10 Essential (primary) hypertension: Secondary | ICD-10-CM | POA: Diagnosis not present

## 2014-08-25 DIAGNOSIS — A419 Sepsis, unspecified organism: Secondary | ICD-10-CM | POA: Diagnosis not present

## 2014-08-25 DIAGNOSIS — R6510 Systemic inflammatory response syndrome (SIRS) of non-infectious origin without acute organ dysfunction: Secondary | ICD-10-CM | POA: Diagnosis not present

## 2014-08-25 DIAGNOSIS — R55 Syncope and collapse: Secondary | ICD-10-CM | POA: Diagnosis not present

## 2014-08-25 DIAGNOSIS — Z7952 Long term (current) use of systemic steroids: Secondary | ICD-10-CM | POA: Diagnosis not present

## 2014-08-25 DIAGNOSIS — M069 Rheumatoid arthritis, unspecified: Secondary | ICD-10-CM | POA: Diagnosis not present

## 2014-08-25 DIAGNOSIS — R42 Dizziness and giddiness: Secondary | ICD-10-CM | POA: Diagnosis not present

## 2014-08-25 LAB — BASIC METABOLIC PANEL
BUN: 18 mg/dL (ref 4–21)
Creatinine: 1.2 mg/dL — AB (ref 0.5–1.1)
Glucose: 115 mg/dL
Potassium: 4.1 mmol/L (ref 3.4–5.3)
Sodium: 137 mmol/L (ref 137–147)

## 2014-08-25 LAB — CBC AND DIFFERENTIAL
HEMATOCRIT: 37 % (ref 36–46)
Hemoglobin: 11.7 g/dL — AB (ref 12.0–16.0)
Platelets: 212 10*3/uL (ref 150–399)
WBC: 20.1 10^3/mL

## 2014-08-25 LAB — HEPATIC FUNCTION PANEL
ALK PHOS: 63 U/L (ref 25–125)
ALT: 29 U/L (ref 7–35)
AST: 23 U/L (ref 13–35)
Bilirubin, Total: 0.6 mg/dL

## 2014-08-25 NOTE — Telephone Encounter (Signed)
If pt is refusing ER, then I would recommend to acute care [name]).  - needs to be seen tonight to determine etiology.

## 2014-08-25 NOTE — Telephone Encounter (Signed)
Spoke with Amy, pts daughter, she states pt refuses to go to the ER.  Please advise

## 2014-08-25 NOTE — Telephone Encounter (Signed)
Spoke with Kaitlyn Huff again.  She states pt is taking a shower now and they will take her to Acute Care or Urgent Care.

## 2014-08-25 NOTE — Telephone Encounter (Signed)
[facility]  Patient Name: Kaitlyn Huff  DOB: 10/26/1940    Initial Comment Caller states mother has been nauseated and dizzy    Nurse Assessment  Nurse: Harlow Mares, RN, Suanne Marker Date/Time (Eastern Time): 08/25/2014 3:58:55 PM  Confirm and document reason for call. If symptomatic, describe symptoms. ---Caller states mother has been nauseated and dizzy and loose stools. When she stands, she is running into the walls, and she has to lie down. She is not dizzy when she is lying down. Reports pressure in her face, and reports that she has had a cold recently with congestion. Dizziness began this morning. Caller reports possible fever earlier today, not now.  Has the patient traveled out of the country within the last 30 days? ---No  Does the patient require triage? ---Yes  Related visit to physician within the last 2 weeks? ---No   08/17/14 had [employer] injection.  Does the PT have any chronic conditions? (i.e. diabetes, asthma, etc.) ---Yes  List chronic conditions. ---leaking heart valve;     Guidelines    Guideline Title Affirmed Question Affirmed Notes  Dizziness - Vertigo SEVERE dizziness (vertigo) (e.g., unable to walk without assistance)    Final Disposition User   Go to ED Now (or PCP triage) Harlow Mares, RN, Suanne Marker

## 2014-08-26 ENCOUNTER — Telehealth: Payer: Self-pay

## 2014-08-26 LAB — BASIC METABOLIC PANEL
BUN: 18 mg/dL (ref 4–21)
Creatinine: 1.2 mg/dL — AB (ref 0.5–1.1)
Potassium: 3.6 mmol/L (ref 3.4–5.3)
SODIUM: 139 mmol/L (ref 137–147)

## 2014-08-26 LAB — CBC AND DIFFERENTIAL
HCT: 30 % — AB (ref 36–46)
HEMOGLOBIN: 9.5 g/dL — AB (ref 12.0–16.0)
Platelets: 168 10*3/uL (ref 150–399)
WBC: 12.8 10^3/mL

## 2014-08-26 LAB — HEPATIC FUNCTION PANEL
ALK PHOS: 50 U/L (ref 25–125)
BILIRUBIN, TOTAL: 0.5 mg/dL

## 2014-08-26 NOTE — Telephone Encounter (Signed)
The patient is being d/c from [facility] today (2/16) and has scheduled a follow up for Monday (2/22)

## 2014-08-26 NOTE — Telephone Encounter (Signed)
Will request records on 08/27/14

## 2014-08-27 ENCOUNTER — Telehealth: Payer: Self-pay | Admitting: *Deleted

## 2014-08-27 NOTE — Telephone Encounter (Signed)
Pt called states upon discharge from the hospital she was given another stool specimen kit.  The attending MD is requesting an order for C Diff be added to your pending Occult stool order.  Please advise

## 2014-08-27 NOTE — Telephone Encounter (Signed)
Spoke with pt, advised of MDs message.  She states she would take the C diff kit/specimen back to the hospital.

## 2014-08-27 NOTE — Telephone Encounter (Signed)
I am not sure that I understand what she needs.  The stool test for c. Diff is done on a different sample then the stool test for blood.  The sample kit - would be to take back to the hospital and run.  Do we need to send an order to the hospital?

## 2014-08-27 NOTE — Telephone Encounter (Signed)
Discharged: 08/26/14  Transition Care Management Follow-up Telephone Call  How have you been since you were released from the hospital? Pt states "doing better than i was"    Do you understand why you were in the hospital? YES   Do you understand the discharge instrcutions? YES  Items Reviewed:  Medications reviewed: YES  Allergies reviewed: NO  Dietary changes reviewed: NO  Referrals reviewed: N/A   Functional Questionnaire:   Activities of Daily Living (ADLs):   She states they are independent in the following:  States they require assistance with the following:    Any transportation issues/concerns?: NO   Any patient concerns? NO  Confirmed importance and date/time of follow-up visits scheduled: YES/   Confirmed with patient if condition begins to worsen call PCP or go to the ER.  Patient was given the Call-a-Nurse line [phone]: {YES/

## 2014-08-27 NOTE — Telephone Encounter (Signed)
Records received & copy given to [facility] & placed in green folder

## 2014-08-28 ENCOUNTER — Other Ambulatory Visit (INDEPENDENT_AMBULATORY_CARE_PROVIDER_SITE_OTHER): Payer: Medicare Other

## 2014-08-28 DIAGNOSIS — D649 Anemia, unspecified: Secondary | ICD-10-CM | POA: Diagnosis not present

## 2014-08-28 LAB — FECAL OCCULT BLOOD, IMMUNOCHEMICAL: Fecal Occult Bld: POSITIVE — AB

## 2014-09-01 ENCOUNTER — Ambulatory Visit (INDEPENDENT_AMBULATORY_CARE_PROVIDER_SITE_OTHER): Payer: Medicare Other | Admitting: Nurse Practitioner

## 2014-09-01 ENCOUNTER — Encounter: Payer: Self-pay | Admitting: Nurse Practitioner

## 2014-09-01 VITALS — BP 128/62 | HR 66 | Temp 98.2°F | Resp 14 | Ht 64.5 in | Wt 158.0 lb

## 2014-09-01 DIAGNOSIS — Z09 Encounter for follow-up examination after completed treatment for conditions other than malignant neoplasm: Secondary | ICD-10-CM

## 2014-09-01 DIAGNOSIS — M25551 Pain in right hip: Secondary | ICD-10-CM | POA: Diagnosis not present

## 2014-09-01 DIAGNOSIS — R197 Diarrhea, unspecified: Secondary | ICD-10-CM

## 2014-09-01 MED ORDER — FLUTICASONE-SALMETEROL 250-50 MCG/DOSE IN AEPB: INHALATION_SPRAY | RESPIRATORY_TRACT | Status: DC

## 2014-09-01 MED ORDER — TRAMADOL HCL 50 MG PO TABS: 50.0000 mg | ORAL_TABLET | Freq: Every evening | ORAL | Status: DC | PRN

## 2014-09-01 NOTE — Progress Notes (Signed)
Pre visit review using our clinic review tool, if applicable. No additional management support is needed unless otherwise documented below in the visit note. 

## 2014-09-01 NOTE — Patient Instructions (Signed)
Bring back your stool sample to the lab per instructions.   Call us if worsens or fails to improve.   Try the Lasix once- if your BP goes to 100/60, below this, or dizziness occurs- stop taking.   [place] [facility] Practice Address:  Address: [address], [place], [place] [address]  [phone] X-rays until 4:15 pm.

## 2014-09-01 NOTE — Progress Notes (Signed)
Subjective:    Patient ID: Kaitlyn Huff, female    DOB: 17-Oct-1940, 74 y.o.   MRN: [id number]  HPI  Ms. Lucarelli is a 74 yo female here for TCM visit, hospital d/c date 08/26/14.   1) Hospitalized for sepsis, possible acute enteritis and near syncope.   Diet- Last BM this morning describes as normal  BP Readings from Last 3 Encounters:  09/01/14 128/62  07/30/14 130/70  05/02/14 120/60  Checks BP at home  Doing better today.   Right hip down painful 3 days  Constant, Achy feeling, 5/10 all the time   Aleve- helpful   1/2 hydrocodone- somewhat helpful  Pt states she has taken tramadol in past without issues   I have personally reviewed records from the hospitalization.  Performed were the following:  EKG- Possible digitalis effect-ST abnormality  Labs- CBC w/ diff, CMET, UA,  CXR- Cardiomegaly and vascular congestion CT Abdomen Pelvis- Fatty infiltration of liver otherwise normal  Review of Systems  Constitutional: Negative for fever, chills, diaphoresis and fatigue.  Respiratory: Negative for chest tightness, shortness of breath and wheezing.   Cardiovascular: Negative for chest pain, palpitations and leg swelling.  Gastrointestinal: Negative for nausea, vomiting, diarrhea and rectal pain.  Musculoskeletal: Positive for myalgias and arthralgias.       Right hip, muscular pain around right hip  Skin: Negative for rash.  Neurological: Negative for dizziness, weakness, numbness and headaches.  Psychiatric/Behavioral: The patient is not nervous/anxious.    Past Medical History  Diagnosis Date  . Rheumatoid arthritis(714.0)     MTX transaminitis, Leflunomide (rash), enbrel, plaquinil, prednisone, remicade, Imuran (transaminitis)  . Hyperlipidemia   . Diverticulitis   . Osteoarthritis   . GERD (gastroesophageal reflux disease)   . Hypertension   . Anemia   . Positive PPD     s/p INH (2006)  . Osteoporosis     actonel  . 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 Marital Status: Married    Spouse Name: N/A  . Number of Children: 4  . Years of Education: N/A   Occupational History  . Not on file.   Social History Main Topics  . Smoking status: Never Smoker   . Smokeless tobacco: Never Used  . Alcohol Use: No  . Drug Use: No  . Sexual Activity: Not on file   Other Topics Concern  . Not on file   Social History Narrative    Past Surgical History  Procedure Laterality Date  . Abdominal hysterectomy    . Tracheostomy  1959  . Tubal ligation    . Cervical cone biopsy    . Ovary surgery    . Cholecystectomy  06/22/14    Family History  Problem Relation Age of Onset  . Heart disease Father     MI  . Heart disease Mother   . Valvular heart disease Mother   . Colon cancer Neg Hx   . Breast cancer Neg Hx     Allergies  Allergen Reactions  . Astelin [Azelastine Hcl]   . Ciprofloxacin Other (See Comments)    GI upset  . Codeine Other (See Comments)    Altered mental status  . Dilaudid [Hydromorphone]   . Flexeril [Cyclobenzaprine]   . Imuran [Azathioprine] Other (See Comments)    Abnormal liver function  . Keflex [Cephalexin] Other (See Comments)    GI upset  . Lisinopril Itching  .  Lyrica [Pregabalin] Other (See Comments)    Sore gums  . Methotrexate Derivatives Other (See Comments)    Abnormal liver function  . Amoxicillin Rash  . Arava [Leflunomide] Rash  . Clindamycin/Lincomycin Rash  . Doxycycline Rash  . Lodine [Etodolac] Rash  . Percocet [Oxycodone-Acetaminophen] Rash  . Sulfa Antibiotics Rash    Current Outpatient Prescriptions on File Prior to Visit  Medication Sig Dispense Refill  . ALPRAZolam (XANAX) 0.25 MG tablet 1/2 - 1 tablet q day prn 90 tablet 0  . aspirin 81 MG tablet Take 81 mg by mouth daily.    . calcium-vitamin D (OSCAL 500/200 D-3) 500-200 MG-UNIT per tablet Take 1 tablet by mouth 2 (two) times daily.    . cetirizine (ZYRTEC ALLERGY) 10 MG tablet Take 1  tablet (10 mg total) by mouth daily. 90 tablet 3  . FeFum-FePo-FA-B Cmp-C-Zn-Mn-Cu (TANDEM PLUS) 162-115.2-1 MG CAPS TAKE 1 CAPSULE DAILY 90 each 3  . fluconazole (DIFLUCAN) 150 MG tablet Take one pill x 1 day and then if persistent symptoms repeat x 1 in three days. 2 tablet 0  . furosemide (LASIX) 20 MG tablet TAKE 1 TABLET DAILY 90 tablet 1  . gabapentin (NEURONTIN) 300 MG capsule Take 300 mg by mouth 4 (four) times daily.    . hydroxychloroquine (PLAQUENIL) 200 MG tablet Take 1 tablet (200 mg total) by mouth 2 (two) times daily. 180 tablet 1  . InFLIXimab (REMICADE IV) Inject every 6 weeks    . metolazone (ZAROXOLYN) 5 MG tablet Take 5 mg by mouth daily.    . metoprolol succinate (TOPROL-XL) 50 MG 24 hr tablet Take 1 tablet (50 mg total) by mouth daily. Take with or immediately following a meal. 90 tablet 3  . Multiple Vitamin (MULTIVITAMIN) capsule Take 1 capsule by mouth daily.    Marland Kitchen NEXIUM 40 MG capsule TAKE 1 CAPSULE (40 MG TOTAL) TWICE A DAY BEFORE A MEAL 180 capsule 3  . potassium chloride (MICRO-K) 10 MEQ CR capsule TAKE 2 CAPSULES TWICE A DAY 360 capsule 1  . risedronate (ACTONEL) 150 MG tablet Take 150 mg by mouth every 30 (thirty) days. with water on empty stomach, nothing by mouth or lie down for next 30 minutes.    . simvastatin (ZOCOR) 20 MG tablet Take 1 tablet (20 mg total) by mouth daily. 90 tablet 3  . sodium chloride (OCEAN) 0.65 % nasal spray Place 1 spray into the nose as needed for congestion.     No current facility-administered medications on file prior to visit.      Objective:   Physical Exam  Constitutional: She is oriented to person, place, and time. She appears well-developed and well-nourished. No distress.  BP 128/62 mmHg  Pulse 66  Temp(Src) 98.2 F (36.8 C) (Oral)  Resp 14  Ht 5' 4.5" (1.638 m)  Wt 158 lb (71.668 kg)  BMI 26.71 kg/m2  SpO2 97%   HENT:  Head: Normocephalic and atraumatic.  Right Ear: External ear normal.  Left Ear: External ear  normal.  Eyes: Right eye exhibits no discharge. Left eye exhibits no discharge. No scleral icterus.  Cardiovascular: Normal rate, regular rhythm, normal heart sounds and intact distal pulses.  Exam reveals no gallop and no friction rub.   No murmur heard. Pulmonary/Chest: Effort normal and breath sounds normal. No respiratory distress. She has no wheezes. She has no rales. She exhibits no tenderness.  Musculoskeletal: She exhibits tenderness. She exhibits no edema.  Right hip tenderness lateral side and up to  abdomen, down to mid-thigh. Normal ROM   Neurological: She is alert and oriented to person, place, and time. No cranial nerve deficit. She exhibits normal muscle tone. Coordination normal.  Skin: Skin is warm and dry. No rash noted. She is not diaphoretic.  Psychiatric: She has a normal mood and affect. Her behavior is normal. Judgment and thought content normal.      Assessment & Plan:

## 2014-09-02 ENCOUNTER — Encounter: Payer: Self-pay | Admitting: Internal Medicine

## 2014-09-02 DIAGNOSIS — R197 Diarrhea, unspecified: Secondary | ICD-10-CM | POA: Insufficient documentation

## 2014-09-02 DIAGNOSIS — Z09 Encounter for follow-up examination after completed treatment for conditions other than malignant neoplasm: Secondary | ICD-10-CM | POA: Insufficient documentation

## 2014-09-02 DIAGNOSIS — M25551 Pain in right hip: Secondary | ICD-10-CM | POA: Insufficient documentation

## 2014-09-02 NOTE — Assessment & Plan Note (Addendum)
D/c'd from [facility] 08/26/14. Pt is improved. She is not following diet recommendations (low fat, low sodium). Reviewed records and will obtain C. Diff. Wants to start back on Lasix, asked her to try once and stop if dizzy or BP 100/60 or below.

## 2014-09-02 NOTE — Assessment & Plan Note (Signed)
Will obtain x-rays of right hip. Worsening hip pain. Tramadol helpful in past 50 mg at bedtime as needed. FU worsening/failure to improve.

## 2014-09-02 NOTE — Assessment & Plan Note (Signed)
Hospital was supposed to obtain C. Diff culture of stool. Specimen was lost. Will obtain. Instructions and collection kit given by lab.

## 2014-09-04 ENCOUNTER — Other Ambulatory Visit: Payer: Self-pay | Admitting: *Deleted

## 2014-09-04 ENCOUNTER — Ambulatory Visit (INDEPENDENT_AMBULATORY_CARE_PROVIDER_SITE_OTHER)
Admission: RE | Admit: 2014-09-04 | Discharge: 2014-09-04 | Disposition: A | Discharge status: Home / Self Care | Payer: Medicare Other | Source: Ambulatory Visit | Attending: Nurse Practitioner | Admitting: Nurse Practitioner

## 2014-09-04 DIAGNOSIS — M1611 Unilateral primary osteoarthritis, right hip: Secondary | ICD-10-CM | POA: Diagnosis not present

## 2014-09-04 DIAGNOSIS — M25551 Pain in right hip: Secondary | ICD-10-CM

## 2014-09-04 MED ORDER — FLUTICASONE-SALMETEROL 250-50 MCG/DOSE IN AEPB: INHALATION_SPRAY | RESPIRATORY_TRACT | Status: DC

## 2014-09-04 NOTE — Progress Notes (Signed)
rx called in

## 2014-09-05 LAB — CLOSTRIDIUM DIFFICILE BY PCR: CDIFFPCR: NOT DETECTED

## 2014-09-15 DIAGNOSIS — M79642 Pain in left hand: Secondary | ICD-10-CM | POA: Diagnosis not present

## 2014-09-15 DIAGNOSIS — M0579 Rheumatoid arthritis with rheumatoid factor of multiple sites without organ or systems involvement: Secondary | ICD-10-CM | POA: Diagnosis not present

## 2014-09-24 DIAGNOSIS — Z79899 Other long term (current) drug therapy: Secondary | ICD-10-CM | POA: Diagnosis not present

## 2014-09-29 ENCOUNTER — Telehealth: Payer: Self-pay | Admitting: *Deleted

## 2014-09-29 MED ORDER — ALPRAZOLAM 0.25 MG PO TABS: ORAL_TABLET | ORAL | Status: DC

## 2014-09-29 NOTE — Telephone Encounter (Signed)
She received 90 tablets on 07/30/14 per note.  She should still have xanax left.  How many is she taking?

## 2014-09-29 NOTE — Telephone Encounter (Signed)
Pt called requesting Xanax refill.  Pt last OV 12.28.15, last refill 1.20.16.  Please advise refill

## 2014-09-29 NOTE — Telephone Encounter (Signed)
If having persistent pain and need for tramadol, would recommend f/u with her rheumatologist.  I would prefer one person prescribe the pain medication.  She can call Dr [name] and let him know what is going on and evaluate her to see if something more needs to be done.

## 2014-09-29 NOTE — Telephone Encounter (Signed)
Kaitlyn Huff prescribed her Tramadol on 2.22.16 for right hip pain.

## 2014-09-29 NOTE — Telephone Encounter (Signed)
I refilled #90 on the xanax.  I would like for her to take one q day prn.  Also, need to clarify the tramadol.  She sees Dr [name] for her arthritis.  Does he usually prescribe the tramadol.

## 2014-09-29 NOTE — Telephone Encounter (Signed)
Spoke with pt, she states she is taking 1.5 tablets of Xanax most days  Pt states she has 35 tablets left.  Pt is also requesting a 90 day Rx for Tramadol be sent to [facility].  Please advise

## 2014-09-29 NOTE — Telephone Encounter (Signed)
Spoke with pt, advised of MDs message.  She verbalized understanding.

## 2014-10-03 ENCOUNTER — Ambulatory Visit: Admitting: Internal Medicine

## 2014-10-07 DIAGNOSIS — M0579 Rheumatoid arthritis with rheumatoid factor of multiple sites without organ or systems involvement: Secondary | ICD-10-CM | POA: Diagnosis not present

## 2014-11-01 ENCOUNTER — Inpatient Hospital Stay
Admit: 2014-11-01 | Disposition: A | Discharge status: Home / Self Care | Payer: Self-pay | Attending: Internal Medicine | Admitting: Internal Medicine

## 2014-11-01 DIAGNOSIS — W19XXXA Unspecified fall, initial encounter: Secondary | ICD-10-CM | POA: Diagnosis not present

## 2014-11-01 DIAGNOSIS — Z881 Allergy status to other antibiotic agents status: Secondary | ICD-10-CM | POA: Diagnosis not present

## 2014-11-01 DIAGNOSIS — Z7952 Long term (current) use of systemic steroids: Secondary | ICD-10-CM | POA: Diagnosis not present

## 2014-11-01 DIAGNOSIS — A419 Sepsis, unspecified organism: Secondary | ICD-10-CM | POA: Diagnosis not present

## 2014-11-01 DIAGNOSIS — I517 Cardiomegaly: Secondary | ICD-10-CM | POA: Diagnosis not present

## 2014-11-01 DIAGNOSIS — J189 Pneumonia, unspecified organism: Secondary | ICD-10-CM | POA: Diagnosis not present

## 2014-11-01 DIAGNOSIS — K219 Gastro-esophageal reflux disease without esophagitis: Secondary | ICD-10-CM | POA: Diagnosis present

## 2014-11-01 DIAGNOSIS — Z7982 Long term (current) use of aspirin: Secondary | ICD-10-CM | POA: Diagnosis not present

## 2014-11-01 DIAGNOSIS — M069 Rheumatoid arthritis, unspecified: Secondary | ICD-10-CM | POA: Diagnosis present

## 2014-11-01 DIAGNOSIS — E871 Hypo-osmolality and hyponatremia: Secondary | ICD-10-CM | POA: Diagnosis not present

## 2014-11-01 DIAGNOSIS — R1110 Vomiting, unspecified: Secondary | ICD-10-CM | POA: Diagnosis not present

## 2014-11-01 DIAGNOSIS — Z888 Allergy status to other drugs, medicaments and biological substances status: Secondary | ICD-10-CM | POA: Diagnosis not present

## 2014-11-01 DIAGNOSIS — N289 Disorder of kidney and ureter, unspecified: Secondary | ICD-10-CM | POA: Diagnosis not present

## 2014-11-01 DIAGNOSIS — R0902 Hypoxemia: Secondary | ICD-10-CM | POA: Diagnosis present

## 2014-11-01 DIAGNOSIS — Z79899 Other long term (current) drug therapy: Secondary | ICD-10-CM | POA: Diagnosis not present

## 2014-11-01 DIAGNOSIS — I10 Essential (primary) hypertension: Secondary | ICD-10-CM | POA: Diagnosis not present

## 2014-11-01 DIAGNOSIS — D649 Anemia, unspecified: Secondary | ICD-10-CM | POA: Diagnosis present

## 2014-11-01 DIAGNOSIS — E785 Hyperlipidemia, unspecified: Secondary | ICD-10-CM | POA: Diagnosis present

## 2014-11-01 DIAGNOSIS — E872 Acidosis: Secondary | ICD-10-CM | POA: Diagnosis present

## 2014-11-01 DIAGNOSIS — Z885 Allergy status to narcotic agent status: Secondary | ICD-10-CM | POA: Diagnosis not present

## 2014-11-01 DIAGNOSIS — I248 Other forms of acute ischemic heart disease: Secondary | ICD-10-CM | POA: Diagnosis not present

## 2014-11-01 DIAGNOSIS — J181 Lobar pneumonia, unspecified organism: Secondary | ICD-10-CM | POA: Diagnosis not present

## 2014-11-01 DIAGNOSIS — R6521 Severe sepsis with septic shock: Secondary | ICD-10-CM | POA: Diagnosis not present

## 2014-11-01 DIAGNOSIS — E119 Type 2 diabetes mellitus without complications: Secondary | ICD-10-CM | POA: Diagnosis present

## 2014-11-01 LAB — CBC WITH DIFFERENTIAL/PLATELET
Basophil #: 0.1 x10 3/mm 3
Basophil %: 0.5 %
Eosinophil #: 0.2 x10 3/mm 3
Eosinophil %: 0.9 %
HCT: 36.1 %
HGB: 11.9 g/dL — ABNORMAL LOW
Lymphocyte %: 10.6 %
Lymphs Abs: 2 x10 3/mm 3
MCH: 30.1 pg
MCHC: 33 g/dL
MCV: 91 fL
Monocyte #: 1.2 "x10 3/mm " — ABNORMAL HIGH
Monocyte %: 6.5 %
Neutrophil #: 15.4 x10 3/mm 3 — ABNORMAL HIGH
Neutrophil %: 81.5 %
Platelet: 198 x10 3/mm 3
RBC: 3.96 X10 6/mm 3
RDW: 14.3 %
WBC: 18.9 x10 3/mm 3 — ABNORMAL HIGH

## 2014-11-01 LAB — COMPREHENSIVE METABOLIC PANEL WITH GFR
Albumin: 4 g/dL
Alkaline Phosphatase: 48 U/L
Anion Gap: 11
BUN: 19 mg/dL
Bilirubin,Total: 0.6 mg/dL
Calcium, Total: 8.7 mg/dL — ABNORMAL LOW
Chloride: 94 mmol/L — ABNORMAL LOW
Co2: 27 mmol/L
Creatinine: 1.08 mg/dL — ABNORMAL HIGH
EGFR (African American): 60 — ABNORMAL LOW
EGFR (Non-African Amer.): 52 — ABNORMAL LOW
Glucose: 129 mg/dL — ABNORMAL HIGH
Potassium: 4.2 mmol/L
SGOT(AST): 35 U/L
SGPT (ALT): 27 U/L
Sodium: 132 mmol/L — ABNORMAL LOW
Total Protein: 7.6 g/dL

## 2014-11-01 LAB — PROTIME-INR
INR: 1
Prothrombin Time: 13.6 s

## 2014-11-01 LAB — TROPONIN I
Troponin-I: 0.09 ng/mL — ABNORMAL HIGH
Troponin-I: 0.07 ng/mL — ABNORMAL HIGH
Troponin-I: 0.07 ng/mL — ABNORMAL HIGH

## 2014-11-01 LAB — CK TOTAL AND CKMB (NOT AT ARMC)
CK, Total: 150 U/L
CK, Total: 134 U/L
CK, Total: 138 U/L
CK-MB: 2.6 ng/mL
CK-MB: 2.2 ng/mL
CK-MB: 2.9 ng/mL

## 2014-11-01 LAB — ED INFLUENZA
Influenza A By PCR: NEGATIVE
Influenza B By PCR: NEGATIVE

## 2014-11-01 LAB — LACTIC ACID, PLASMA
LACTIC ACID, VENOUS: 2.3 mmol/L — AB
Lactic Acid, Venous: 2.2 mmol/L

## 2014-11-01 NOTE — Op Note (Signed)
PATIENT NAME:  Kaitlyn Huff, GILDAY MR#:  [id number] DATE OF BIRTH:  October 12, 1940  DATE OF PROCEDURE:  06/22/2014  PREOPERATIVE DIAGNOSIS: Acute cholecystitis.   POSTOPERATIVE DIAGNOSIS: Acute cholecystitis.   PROCEDURE: Laparoscopic cholecystectomy.   SURGEON: Rodena Goldmann, MD   ANESTHESIA: General.   OPERATIVE PROCEDURE: With the patient in the supine position after induction of appropriate general anesthesia, the patient's abdomen was prepped with ChloraPrep and draped with sterile towels. The patient was placed in the head down, feet up position. A small infraumbilical incision was made in the standard fashion, carried down bluntly through the subcutaneous tissue. The Veress needle was used to cannulate the peritoneal cavity. CO2 was insufflated to appropriate pressure measurements. When approximately 2.5 liters of CO2 were instilled, the Veress needle was withdrawn, 11 mm [employer] port inserted into the peritoneal cavity. Intraperitoneal position was confirmed and CO2 was reinsufflated. The patient was placed in the head up, feet down position and rotated slightly to the left side. A subxiphoid transverse incision was made and an 11 mm port was inserted under direct vision. Two lateral ports, 5 mm in size, were inserted under direct vision. The gallbladder was tense, distended and edematous, but did not appear to be necrotic or emphysematous. The gallbladder was aspirated to approximately 40 mL of dirty motor oil-colored bile. The gallbladder was grasped, retracted superiorly and laterally, exposing the hepatoduodenal ligament. The cystic artery and cystic duct were identified. The cystic duct was doubly clipped and divided. The cystic artery was doubly clipped and divided. The gallbladder was then dissected free from its bed and delivered using hook and cautery apparatus. There were multiple areas of increased vascularity between the gallbladder and the bed of the liver. The gallbladder was then  captured in an Endo Catch apparatus and removed through the subxiphoid incision. The area was copiously irrigated with 2 liters of warm saline solution. Because of the vascularity, a Blake drain was inserted through the mid epigastric port and brought out through one of the lateral stab wounds. The abdomen was then desufflated. All ports were withdrawn without difficulty. A drain was secured with 3-0 nylon. Skin incisions were closed with 5-0 nylon. Sterile dressings were applied. The patient was returned to the recovery room, having tolerated the procedure well. Sponge, instrument and needle counts were correct x2 in the operating room.    ____________________________ Micheline Maze, MD rle:ap D: 06/22/2014 11:06:23 ET T: 06/22/2014 11:17:08 ET JOB#: [id number]  cc: Micheline Maze, MD, <Dictator> Javier Docker. Ubaldo Glassing, MD [name], MD   Rodena Goldmann MD ELECTRONICALLY SIGNED 06/25/2014 8:43

## 2014-11-01 NOTE — H&P (Signed)
PATIENT NAME:  Kaitlyn Huff, Kaitlyn Huff MR#:  [id number] DATE OF BIRTH:  11-06-1940  DATE OF ADMISSION:  06/21/2014  PRIMARY CARE PHYSICIAN: [name], MD  CARDIOLOGIST: Jordan Hawks, MD  ADMITTING PHYSICIAN:   [facility]  CHIEF COMPLAINT: Nausea, vomiting, diarrhea.   BRIEF HISTORY: Kaitlyn Huff is a 74 year old woman seen in the emergency room with a several day history of nausea and vomiting, accumulating in some profound diarrhea last evening, followed by a syncopal spell. She fell several times in the bathtub, actually tearing down the shower curtain. Family brought her to the hospital today for further evaluation. Initial syncopal work-up was unremarkable. Head CT scan did not reveal any significant injury. Neck CT scan revealed her known degenerative joint disease. Further work-up revealed an elevated white blood cell count at 17,600 with hemoglobin of 11,900. SGOT was slightly elevated. Bilirubin was normal. Abdominal CT scan was performed which demonstrated evidence of acute cholecystitis. No stones were seen. Ultrasound was performed which revealed multiple small stones, gallbladder wall thickening, a dilated common bile duct which was consistent with a previous study and highly suggestive for acute cholecystitis. The surgical service was consulted.   PAST MEDICAL HISTORY: She denies any history of hepatitis, yellow jaundice, pancreatitis, peptic ulcers, previous diagnosis of gallbladder disease. She does have a history of diverticulitis. 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 She is followed regularly by [name]. She has a history of  cardiac valvular disease without a history of coronary artery disease. She is followed by Dr. Jordan Hawks. She has a history of hypertension. She has had severe rheumatoid arthritis and marked cervical spine disease. She has been followed in the pain clinic for a number of years. She related that she  underwent cardiac echo several months ago, which was unchanged and she is set up for a repeat cardiac echo next month with Dr. Ubaldo Glassing.   CURRENT MEDICATIONS: Include aspirin 81 mg p.o. daily, Actonel 35 mg once a month, Cetirizine 10 mg once a day, clonazepam 0.5 mg 2 at night, Flonase 2 sprays b.i.d., hydroxychloroquine 200 mg twice a day, Mobic 15 mg once a day, Ultram 50 mg every 6 hours p.r.n., Zanaflex 2 mg once a day, metoprolol XL 50 mg once a day, Zyrtec 10 mg p.o. once a day.   ALLERGIES: SHE IS ALLERGIC TO MULTIPLE MEDICATIONS INCLUDING AMOXICILLIN, ASTELIN, CIPRO, DOXYCYCLINE, KEFLEX, PENICILLIN AND SULFA DRUGS. ALL OF THOSE MEDICATIONS CAUSE A RASH. SHE IS ALSO HAD A BAD REACTION TO NARCOTICS WITH CODEINE, DILAUDID, MORPHINE AND FLEXERIL CAUSING CONFUSION AND MENTAL STATUS CHANGES.   REVIEW OF SYSTEMS: Otherwise unremarkable. She denies any other significant symptoms including chest pain, shortness of breath, urinary symptoms or any other neurologic problem.   FAMILY HISTORY: Noncontributory.   PHYSICAL EXAMINATION: GENERAL: She is lying comfortably in bed. Appears to be appropriate. Does not have any evidence of syncope at the present time. She denies any pain at the present time saying that she has just sore.  VITAL SIGNS: Temperature is 101.9. Blood pressure 137/100. Heart rate is 73 and regular.    HEENT: No scleral icterus. No pupillary abnormalities. No facial deformities.   NECK: Supple, midline tracheotomy scar. She does have some tenderness posteriorly.   CHEST: Clear with no adventitious sounds, but she has very distant breath sounds and normal pulmonary excursion.   CARDIAC: Exam reveals a 2/6 systolic murmur that does not appear to be particularly impressive. She does appear to  be in normal sinus rhythm.   ABDOMEN: Soft with some mild bilateral upper quadrant tenderness. She does not have any point tenderness, rebound, guarding or hernias. She does appear to have active  bowel sounds.   EXTREMITIES: Lower extremity exam reveals some mild edema. Full range of motion. No deformities.   PSYCHIATRIC: Normal orientation, normal affect.   IMPRESSION: I have independently reviewed her CT scan. She does appear to have significant evidence for acute cholecystitis. If this episode has been going on for some time, she could easily have developed syncope from her dehydration with nausea and vomiting. I have contacted cardiology and asked them to review her most recent echo to see if she has further cardiac risk assessment prior to surgical intervention. I have talked to the patient and her family about surgery and they are in agreement. We will plan tentatively to pursue surgery tomorrow if her symptoms are well managed. She is in agreement with this plan.     ____________________________ Micheline Maze, MD rle:TT D: 06/21/2014 16:34:01 ET T: 06/21/2014 16:55:57 ET JOB#: [id number]  cc: Micheline Maze, MD, <Dictator> [name], MD Javier Docker. Ubaldo Glassing, MD Rodena Goldmann MD ELECTRONICALLY SIGNED 06/25/2014 8:43

## 2014-11-01 NOTE — Consult Note (Signed)
PATIENT NAME:  Kaitlyn Huff, Kaitlyn Huff MR#:  [id number] DATE OF BIRTH:  12-29-1940  DATE OF CONSULTATION:  06/21/2014  REFERRING PHYSICIAN:   CONSULTING PHYSICIAN:  Isaias Cowman, MD  PRIMARY CARE PHYSICIAN: [name], MD  CARDIOLOGIST: Javier Docker. Ubaldo Glassing, MD  CHIEF COMPLAINT: Nausea, vomiting, diarrhea.   REASON FOR CONSULTATION: Consultation requested for evaluation of valvular heart disease prior to cholecystectomy.   HISTORY OF PRESENT ILLNESS: The patient is a 74 year old female with a history of hypertension and apparent mitral regurgitation. The patient presents with a several-day history of nausea, vomiting and diarrhea. She fell multiple times in the bathtub. She was brought to [facility] Emergency Room where a head CT did not reveal any significant injury. Admission labs were notable for elevated white count of 17,600. Abdominal CT was performed, which revealed evidence for acute cholecystitis. The patient is scheduled for surgery in the morning.   The patient is followed by Dr. Ubaldo Glassing with apparent history of mitral regurgitation. The patient denies prior history of myocardial infarction, congestive heart failure, stroke, chronic kidney disease, or diabetes.   PAST MEDICAL HISTORY:  1.  Mitral regurgitation.  2.  Hypertension.   MEDICATIONS: Aspirin 81 mg daily, Actonel 35 mg daily, cetirizine 10 mg daily, clonazepam 0.5 mg 2 tabs at bedtime, Flonase 2 sprays b.i.d., hydroxychloroquine 200 mg b.i.d., Mobic 15 mg daily, Ultram 50 mg q. 6, Zanaflex 2 mg daily, metoprolol XL 50 mg daily, Zyrtec 10 mg daily.   SOCIAL HISTORY: The patient denies tobacco or EtOH abuse.   FAMILY HISTORY: No immediate family history of coronary artery disease or myocardial infarction.  REVIEW OF SYSTEMS:  CONSTITUTIONAL: The patient has had some mild fever and chills.  EYES: No blurry vision.  EARS: No hearing loss.  RESPIRATORY: No shortness of breath.  CARDIOVASCULAR: No chest pain.  GASTROINTESTINAL:  The patient has had nausea, vomiting, diarrhea.  GENITOURINARY: No dysuria or hematuria.  ENDOCRINE: No polyuria or polydipsia.  MUSCULOSKELETAL: No arthralgias or myalgias.  NEUROLOGICAL: No focal muscle weakness or numbness.  PSYCHOLOGICAL: No depression or anxiety.   PHYSICAL EXAMINATION:  VITAL SIGNS: Blood pressure 115/58, pulse 73, respirations 16, temperature 99.6, pulse oximetry 94%.  HEENT: Pupils equal and reactive to light and accommodation.  NECK: Supple without thyromegaly.  LUNGS: Clear.  CARDIOVASCULAR: Normal JVP. Normal PMI. Regular rate and rhythm. Normal S1, S2. Grade 1 to 2/6 mid peaking systolic murmur.  ABDOMEN: Soft with hypoactive bowel sounds.  MUSCULOSKELETAL: Normal muscle tone.  NEUROLOGIC: The patient is alert and oriented x 3. Motor and sensory both grossly intact.   IMPRESSION: A 74 year old female with mitral regurgitation by history and by physical examination, hypertension, who presents with acute cholecystitis. The patient has no prior history of myocardial infarction, congestive heart failure, stroke, diabetes, or chronic kidney disease. The patient should be at low risk for serious cardiovascular complication during surgery for acute cholecystitis.   RECOMMENDATIONS:  1.  Agree with overall current therapy.  2.  Continue metoprolol pre-, peri- and postoperatively.  3.  Defer further cardiac diagnostics at this time.  4.  Proceed with surgery in a.m. The patient currently is at acceptable risk.   ____________________________ Isaias Cowman, MD ap:ST D: 06/21/2014 19:33:03 ET T: 06/21/2014 21:41:34 ET JOB#: [id number]  cc: Isaias Cowman, MD, <Dictator> Isaias Cowman MD ELECTRONICALLY SIGNED 07/08/2014 13:08

## 2014-11-01 NOTE — Discharge Summary (Signed)
PATIENT NAME:  Kaitlyn Huff, Kaitlyn Huff MR#:  [id number] DATE OF BIRTH:  [birth year]  DATE OF ADMISSION:  06/21/2014 DATE OF DISCHARGE:  06/25/2014  BRIEF HISTORY: Miss Kaitlyn Huff is a 74 year old woman admitted to the hospital through the Emergency Room with abdominal pain consistent with possible acute cholecystitis. She had had a syncopal episode and fell, was worked up in the Emergency Room with a pan CT scan which demonstrated evidence of acute cholecystitis. She did have some mild abdominal pain, but had symptoms intermittently for some time. Ultrasound revealed multiple small stones, gallbladder wall thickening, dilated common bile duct consistent with previously identified study and highly suggestive overall picture for acute cholecystitis. She does have a long-standing history of coronary artery disease and cardiomyopathy. She was seen by the cardiologist that afternoon and they felt that she was reasonable risk for surgical intervention. She was taken to surgery on December 13 where she underwent a laparoscopic cholecystectomy. At surgery she did have tense distended gallbladder with no evidence of any necrosis, gangrenous change, or emphysematous changes. Surgery was uncomplicated. She had no significant postoperative problems. She was discharged home on the 16th to be followed in the office in 7-10 days time. Bathing, activity, and driving instructions were given to the patient.   DISCHARGE MEDICATIONS: Include Centrum Silver once a day, Actonel 35 mg once a month, hydroxychloroquine 200 mg b.i.d., furosemide 20 mg once a day, metoprolol 50 mg once a day, Advair 1 puff b.i.d., alprazolam 0.5 mg once a day p.r.n., potassium 10 mEq once a day, Nexium 40 mg b.i.d., gabapentin 300 mg q.i.d., simvastatin 20 mg once a day, enteric aspirin 81 mg once a day, Zyrtec 10 mg once a day, acetaminophen/hydrocodone 325/5 mg every 4-6 hours p.r.n.   FINAL DISCHARGE DIAGNOSIS: Acute cholecystitis with cholelithiasis,  negative for dysplasia or malignancy.   DISPOSITION: Discharged home to be followed in the office in 7-10 days time.   FINAL DISCHARGE DIAGNOSIS: Acute cholecystitis.   SURGERY: Laparoscopic cholecystectomy.     ____________________________ Micheline Maze, MD rle:bu D: 06/30/2014 19:50:02 ET T: 06/30/2014 20:04:36 ET JOB#: [id number]  cc: Micheline Maze, MD, <Dictator> [name], MD Javier Docker. Ubaldo Glassing, MD Rodena Goldmann MD ELECTRONICALLY SIGNED 07/02/2014 23:08

## 2014-11-02 LAB — URINALYSIS, COMPLETE
BLOOD: NEGATIVE
Bacteria: NONE SEEN
Bilirubin,UR: NEGATIVE
Glucose,UR: NEGATIVE mg/dL (ref 0–75)
Ketone: NEGATIVE
Leukocyte Esterase: NEGATIVE
Nitrite: NEGATIVE
PH: 5 (ref 4.5–8.0)
Protein: NEGATIVE
Specific Gravity: 1.01 (ref 1.003–1.030)

## 2014-11-02 LAB — OCCULT BLOOD X 1 CARD TO LAB, STOOL: Occult Blood, Feces: NEGATIVE

## 2014-11-03 ENCOUNTER — Telehealth: Payer: Self-pay

## 2014-11-03 LAB — CBC WITH DIFFERENTIAL/PLATELET
Basophil #: 0 10*3/uL (ref 0.0–0.1)
Basophil %: 0.1 %
EOS ABS: 0 10*3/uL (ref 0.0–0.7)
Eosinophil %: 0 %
HCT: 27.7 % — ABNORMAL LOW (ref 35.0–47.0)
HGB: 9.2 g/dL — ABNORMAL LOW (ref 12.0–16.0)
Lymphocyte #: 0.9 10*3/uL — ABNORMAL LOW (ref 1.0–3.6)
Lymphocyte %: 11.3 %
MCH: 31 pg (ref 26.0–34.0)
MCHC: 33.2 g/dL (ref 32.0–36.0)
MCV: 94 fL (ref 80–100)
Monocyte #: 0.3 x10 3/mm (ref 0.2–0.9)
Monocyte %: 3.5 %
NEUTROS PCT: 85.1 %
Neutrophil #: 6.8 10*3/uL — ABNORMAL HIGH (ref 1.4–6.5)
PLATELETS: 142 10*3/uL — AB (ref 150–440)
RBC: 2.97 10*6/uL — ABNORMAL LOW (ref 3.80–5.20)
RDW: 14 % (ref 11.5–14.5)
WBC: 8 10*3/uL (ref 3.6–11.0)

## 2014-11-03 LAB — BASIC METABOLIC PANEL
Anion Gap: 9 (ref 7–16)
BUN: 16 mg/dL
CALCIUM: 7.6 mg/dL — AB
CO2: 23 mmol/L
Chloride: 108 mmol/L
Creatinine: 0.75 mg/dL
GLUCOSE: 133 mg/dL — AB
Potassium: 3.7 mmol/L
SODIUM: 140 mmol/L

## 2014-11-03 LAB — SURGICAL PATHOLOGY

## 2014-11-03 LAB — URINE CULTURE

## 2014-11-03 NOTE — Telephone Encounter (Signed)
Please change pt to my schedule on 11/07/14 at 1:30 - block 30 minutes.  Thanks

## 2014-11-03 NOTE — Telephone Encounter (Signed)
Records requested

## 2014-11-03 NOTE — Telephone Encounter (Signed)
Records placed in green folder

## 2014-11-03 NOTE — Telephone Encounter (Signed)
The patient is being d/c from the hospital today (4/25)

## 2014-11-04 NOTE — Addendum Note (Signed)
Addended by: Wynonia Lawman E on: 11/04/2014 01:20 PM   Modules accepted: Orders, Medications

## 2014-11-04 NOTE — Telephone Encounter (Signed)
appt moved to Dr. Nicki Reaper on 11/07/14 @ 1:30 Jacqlyn Larsen will notify patient at [facility] call)

## 2014-11-04 NOTE — Telephone Encounter (Signed)
Pt notified and  verbalized understanding. Now states she doesn't even have any of the Metolazone. Is unsure if she has been taking it or not. States she has only been taking the Metoprolol. Advised Dr. Nicki Reaper would discuss at her appt on Friday and to continue Metoprolol.

## 2014-11-04 NOTE — Telephone Encounter (Addendum)
Discharge Date: 11/03/14  Transition Care Management Follow-up Telephone Call  How have you been since you were released from the hospital? Pt feels weak, but is "getting around ok". Denies fever or cough. Complains of  A "sore mouth" but no lesions or sores noted, no white spots or coating noticed in mouth. Advised to call back if she notices either of these prior to her appt on Friday.    Do you understand why you were in the hospital? YES, sepsis; left-sided pneumonia. hypotension   Do you understand the discharge instructions? YES. See med changes below.   Items Reviewed:  Medications reviewed: YES, Levaquin 750 mg daily x 5 days started. Pt was told to stop potassium supplements. She states her BP was low in the hospital, they stopped her Metolazone in the hospital, but did not tell her to discontinue on discharge. She questions whether she needs to take it or not. BP last night 138/82, HR 67 on home cuff. She was told to continue prednisone and metoprolol. Will ask Dr. Nicki Reaper and advised I would call her back. She also wants to know if she needs to hold any of her medications while on Levaquin. Thinks she had to hold simvastatin one time when she was on a medication but can't remember which one, asked if it was diflucan but she can't remember. Advised I would call her back.   Allergies reviewed: No new drug allergies while admitted  Dietary changes reviewed: YES, Low sodium, ADA 1800 calorie diet.   Referrals reviewed: No referrals placed   Functional Questionnaire:   Activities of Daily Living (ADLs):  She states they are independent in the following: All activities States they require assistance with the following: None   Any transportation issues/concerns?: Can drive self, will ask for help with family if she doesn't feel up to it.   Any patient concerns? Pt just had questions about her medications and if she is to continue Metolazone. Questions sent to Dr. Nicki Reaper.    Confirmed importance and date/time of follow-up visits scheduled: YES, appt with Dr. Nicki Reaper 11/07/14 @ 1:30, pt verified appt. She is also to follow up with Dr. Precious Reel regarding her Remicade. She will contact their office.    Confirmed with patient if condition begins to worsen call PCP or go to the ER. Patient was given the Call-a-Nurse line [phone]: YES

## 2014-11-04 NOTE — Telephone Encounter (Signed)
I would recommend continuing to hold metolazone until we can reassess her.  Follow blood pressure.  Looks good on last check.  Can take cholesterol medication while on levaquin.

## 2014-11-04 NOTE — Telephone Encounter (Signed)
   TCM call completed. Pt had these questions/concerns:  Levaquin 750 mg daily x 5 days started. Pt was told to stop potassium supplements. She states her BP was low in the hospital, they stopped her Metolazone in the hospital, but did not tell her to discontinue on discharge. She questions whether she needs to take it or not. BP last night 138/82, HR 67 on home cuff. She was told to continue prednisone and metoprolol. Will ask Dr. Nicki Reaper and advised I would call her back. She also wants to know if she needs to hold any of her medications while on Levaquin. Thinks she had to hold simvastatin one time when she was on a medication but can't remember which one, asked if it was diflucan but she can't remember. Advised I would call her back.

## 2014-11-06 LAB — CULTURE, BLOOD (SINGLE)

## 2014-11-07 ENCOUNTER — Ambulatory Visit (INDEPENDENT_AMBULATORY_CARE_PROVIDER_SITE_OTHER): Payer: Medicare Other | Admitting: Internal Medicine

## 2014-11-07 ENCOUNTER — Encounter: Payer: Self-pay | Admitting: Internal Medicine

## 2014-11-07 VITALS — BP 140/80 | HR 65 | Temp 98.2°F | Ht 64.5 in | Wt 164.2 lb

## 2014-11-07 DIAGNOSIS — I10 Essential (primary) hypertension: Secondary | ICD-10-CM | POA: Diagnosis not present

## 2014-11-07 DIAGNOSIS — J189 Pneumonia, unspecified organism: Secondary | ICD-10-CM

## 2014-11-07 DIAGNOSIS — M069 Rheumatoid arthritis, unspecified: Secondary | ICD-10-CM

## 2014-11-07 DIAGNOSIS — K219 Gastro-esophageal reflux disease without esophagitis: Secondary | ICD-10-CM

## 2014-11-07 DIAGNOSIS — Z09 Encounter for follow-up examination after completed treatment for conditions other than malignant neoplasm: Secondary | ICD-10-CM

## 2014-11-07 DIAGNOSIS — D649 Anemia, unspecified: Secondary | ICD-10-CM

## 2014-11-07 LAB — CBC WITH DIFFERENTIAL/PLATELET
BASOS ABS: 0 10*3/uL (ref 0.0–0.1)
Basophils Relative: 0.4 % (ref 0.0–3.0)
EOS ABS: 0.2 10*3/uL (ref 0.0–0.7)
Eosinophils Relative: 3.1 % (ref 0.0–5.0)
HEMATOCRIT: 32.1 % — AB (ref 36.0–46.0)
Hemoglobin: 10.9 g/dL — ABNORMAL LOW (ref 12.0–15.0)
Lymphocytes Relative: 28.7 % (ref 12.0–46.0)
Lymphs Abs: 2 10*3/uL (ref 0.7–4.0)
MCHC: 34 g/dL (ref 30.0–36.0)
MCV: 90.6 fl (ref 78.0–100.0)
Monocytes Absolute: 0.5 10*3/uL (ref 0.1–1.0)
Monocytes Relative: 7.7 % (ref 3.0–12.0)
NEUTROS ABS: 4.3 10*3/uL (ref 1.4–7.7)
NEUTROS PCT: 60.1 % (ref 43.0–77.0)
Platelets: 210 10*3/uL (ref 150.0–400.0)
RBC: 3.55 Mil/uL — AB (ref 3.87–5.11)
RDW: 13.9 % (ref 11.5–15.5)
WBC: 7.1 10*3/uL (ref 4.0–10.5)

## 2014-11-07 LAB — BASIC METABOLIC PANEL
BUN: 13 mg/dL (ref 6–23)
CHLORIDE: 99 meq/L (ref 96–112)
CO2: 31 mEq/L (ref 19–32)
CREATININE: 0.92 mg/dL (ref 0.40–1.20)
Calcium: 9.3 mg/dL (ref 8.4–10.5)
GFR: 63.86 mL/min (ref >60.00)
GLUCOSE: 91 mg/dL (ref 70–99)
POTASSIUM: 3.3 meq/L — AB (ref 3.5–5.1)
Sodium: 138 mEq/L (ref 135–145)

## 2014-11-07 LAB — FERRITIN: FERRITIN: 195.3 ng/mL (ref 10.0–291.0)

## 2014-11-07 MED ORDER — FIRST-DUKES MOUTHWASH MT SUSP: OROMUCOSAL | Status: DC

## 2014-11-07 NOTE — Progress Notes (Signed)
Patient ID: Kaitlyn Huff, female   DOB: [birth year], 74 y.o.   MRN: [id number]   Subjective:    Patient ID: Kaitlyn Huff, female    DOB: 12-08-1940, 74 y.o.   MRN: [id number]  HPI  Patient here for a hospital follow up.  Was recently hospitalized and diagnosed with pneumonia and sepsis.  Treated with IV abx initially.  Now on oral abx.  Feels better.  No increased cough or congestion.  Breathing stable.  No increased sob.  Did not take her lasix today.  Was told to stop her potassium in the hospital.  Is eating and drinking.  No nausea or vomiting.  No bowel change.  Energy improving.     Past Medical History  Diagnosis Date  . Rheumatoid arthritis(714.0)     MTX transaminitis, Leflunomide (rash), enbrel, plaquinil, prednisone, remicade, Imuran (transaminitis)  . Hyperlipidemia   . Diverticulitis   . Osteoarthritis   . GERD (gastroesophageal reflux disease)   . Hypertension   . Anemia   . Positive PPD     s/p INH (2006)  . Osteoporosis     actonel  . 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 alendronate (FOSAMAX) 70 MG tablet Take 70 mg by mouth once a week. Take with a full glass of water on an empty stomach.    . ALPRAZolam (XANAX) 0.25 MG tablet 1 tablet q day prn 90 tablet 0  . aspirin 81 MG tablet Take 81 mg by mouth daily.    . calcium-vitamin D (OSCAL 500/200 D-3) 500-200 MG-UNIT per tablet Take 1 tablet by mouth 2 (two) times daily.    . cetirizine (ZYRTEC ALLERGY) 10 MG tablet Take 1 tablet (10 mg total) by mouth daily. 90 tablet 3  . FeFum-FePo-FA-B Cmp-C-Zn-Mn-Cu (TANDEM PLUS) 162-115.2-1 MG CAPS TAKE 1 CAPSULE DAILY 90 each 3  . Fluticasone-Salmeterol (ADVAIR DISKUS) 250-50 MCG/DOSE AEPB INHALE 1 PUFF INTO THE LUNGS EVERY 12 (TWELVE) HOURS 3 each 1  . furosemide (LASIX) 20 MG tablet TAKE 1 TABLET DAILY 90 tablet 1  . gabapentin (NEURONTIN) 300 MG capsule Take 300 mg  by mouth 4 (four) times daily.    . hydroxychloroquine (PLAQUENIL) 200 MG tablet Take 1 tablet (200 mg total) by mouth 2 (two) times daily. 180 tablet 1  . InFLIXimab (REMICADE IV) Inject every 6 weeks    . levofloxacin (LEVAQUIN) 750 MG tablet Take 750 mg by mouth daily.    . metoprolol succinate (TOPROL-XL) 50 MG 24 hr tablet Take 1 tablet (50 mg total) by mouth daily. Take with or immediately following a meal. 90 tablet 3  . Multiple Vitamin (MULTIVITAMIN) capsule Take 1 capsule by mouth daily.    Marland Kitchen NEXIUM 40 MG capsule TAKE 1 CAPSULE (40 MG TOTAL) TWICE A DAY BEFORE A MEAL 180 capsule 3  . predniSONE (DELTASONE) 5 MG tablet Take 5 mg by mouth daily.  0  . simvastatin (ZOCOR) 20 MG tablet Take 1 tablet (20 mg total) by mouth daily. 90 tablet 3  . sodium chloride (OCEAN) 0.65 % nasal spray Place 1 spray into the nose as needed for congestion.    . traMADol (ULTRAM) 50 MG tablet Take 1 tablet (50 mg total) by mouth at bedtime as needed. 30 tablet 0   No current facility-administered medications on file prior to visit.    Review of Systems  Constitutional:  Negative for appetite change and unexpected weight change.  HENT: Negative for congestion and sinus pressure.   Respiratory: Negative for cough, chest tightness and shortness of breath.   Cardiovascular: Negative for chest pain, palpitations and leg swelling.  Gastrointestinal: Negative for nausea, vomiting, abdominal pain and diarrhea.  Genitourinary: Negative for dysuria and difficulty urinating.  Skin: Negative for color change and rash.  Neurological: Negative for dizziness, light-headedness and headaches.  Hematological: Negative for adenopathy. Does not bruise/bleed easily.  Psychiatric/Behavioral: Negative for dysphoric mood and agitation.       Objective:    Physical Exam  Constitutional: She appears well-developed and well-nourished. No distress.  HENT:  Nose: Nose normal.  Mouth/Throat: Oropharynx is clear and moist.    Eyes: Right eye exhibits no discharge. Left eye exhibits no discharge.  Neck: Neck supple. No thyromegaly present.  Cardiovascular: Normal rate and regular rhythm.   Pulmonary/Chest: Breath sounds normal. No respiratory distress. She has no wheezes.  Abdominal: Soft. Bowel sounds are normal. There is no tenderness.  Musculoskeletal: She exhibits no edema or tenderness.  Lymphadenopathy:    She has no cervical adenopathy.  Skin: No rash noted. No erythema.    BP 140/80 mmHg  Pulse 65  Temp(Src) 98.2 F (36.8 C) (Oral)  Ht 5' 4.5" (1.638 m)  Wt 164 lb 4 oz (74.503 kg)  BMI 27.77 kg/m2  SpO2 96% Wt Readings from Last 3 Encounters:  11/07/14 164 lb 4 oz (74.503 kg)  09/01/14 158 lb (71.668 kg)  07/30/14 151 lb 4 oz (68.607 kg)     Lab Results  Component Value Date   WBC 7.1 11/07/2014   HGB 10.9* 11/07/2014   HCT 32.1* 11/07/2014   PLT 210.0 11/07/2014   GLUCOSE 91 11/07/2014   CHOL 133 07/28/2014   TRIG 130.0 07/28/2014   HDL 56.20 07/28/2014   LDLDIRECT 73.7 08/20/2013   LDLCALC 51 07/28/2014   ALT 27 11/01/2014   AST 35 11/01/2014   NA 138 11/07/2014   K 3.3* 11/07/2014   CL 99 11/07/2014   CREATININE 0.92 11/07/2014   BUN 13 11/07/2014   CO2 31 11/07/2014   TSH 4.13 01/24/2014   INR 1.0 11/01/2014    Dg Chest Port 1 View  11/01/2014   CLINICAL DATA:  Nausea, vomiting, and weakness  EXAM: PORTABLE CHEST - 1 VIEW  COMPARISON:  None.  FINDINGS: The heart is mildly enlarged. Left perihilar and basilar consolidation is present. Right lung is grossly clear. No pneumothorax. No pleural effusion.  IMPRESSION: Left mid and lower lung zone consolidation. Short-term follow-up radiographs are recommended to ensure resolution.   Electronically Signed   By: Marybelle Killings M.D.   On: 11/01/2014 16:21       Assessment & Plan:   Problem List Items Addressed This Visit    Anemia - Primary    Hgb decreased in hospital.  Recheck cbc and iron.  Follow.        Relevant Orders    CBC with Differential/Platelet (Completed)   Ferritin (Completed)   GERD (gastroesophageal reflux disease)    Controlled.        Hospital discharge follow-up    Just discharged after beig admitted with pneumonia and sepsis.  Doing better.  Complete abx.  Will need f/u cxr in 3-4 weeks.  Order given.        Hypertension    Blood pressure as outlined.  Check metabolic panel.  If renal function ok, will plan to restart lasix and potassium.  Follow.        Relevant Orders   Basic metabolic panel (Completed)   Pneumonia    Treated for pneumonia.  Doing better.  Complete abx.  Will need f/u cxr in 3-4 weeks.        Rheumatoid arthritis    With the recent pneumonia and sepsis, she will d/w Dr [name] regarding further treatment.            [name], MD

## 2014-11-07 NOTE — Progress Notes (Signed)
Pre visit review using our clinic review tool, if applicable. No additional management support is needed unless otherwise documented below in the visit note. 

## 2014-11-08 ENCOUNTER — Other Ambulatory Visit: Payer: Self-pay | Admitting: Internal Medicine

## 2014-11-08 DIAGNOSIS — E876 Hypokalemia: Secondary | ICD-10-CM

## 2014-11-08 DIAGNOSIS — D649 Anemia, unspecified: Secondary | ICD-10-CM

## 2014-11-08 NOTE — Progress Notes (Signed)
Order placed for f/u labs.  

## 2014-11-09 ENCOUNTER — Encounter: Payer: Self-pay | Admitting: Internal Medicine

## 2014-11-09 DIAGNOSIS — J189 Pneumonia, unspecified organism: Secondary | ICD-10-CM | POA: Insufficient documentation

## 2014-11-09 NOTE — Assessment & Plan Note (Signed)
Treated for pneumonia.  Doing better.  Complete abx.  Will need f/u cxr in 3-4 weeks.

## 2014-11-09 NOTE — Assessment & Plan Note (Signed)
Hgb decreased in hospital.  Recheck cbc and iron.  Follow.

## 2014-11-09 NOTE — Assessment & Plan Note (Signed)
With the recent pneumonia and sepsis, she will d/w Dr [name] regarding further treatment.

## 2014-11-09 NOTE — Assessment & Plan Note (Signed)
Controlled.  

## 2014-11-09 NOTE — Discharge Summary (Signed)
PATIENT NAME:  Kaitlyn Huff, MAHLER MR#:  [id number] DATE OF BIRTH:  [birth year]  DATE OF ADMISSION:  11/01/2014 DATE OF DISCHARGE:  11/03/2014  ADMITTING PHYSICIAN:  Dr. Ether Griffins.   DISCHARGING PHYSICIAN:  Gladstone Lighter, MD.    PRIMARY CARE PHYSICIAN:  Dr. [name].   Mora:  None.   DISCHARGE DIAGNOSES:  1. Sepsis.  2. Left-sided pneumonia.  3. Hypertension.  4. Diet-controlled diabetes mellitus.  5. Rheumatoid arthritis.   DISCHARGE HOME MEDICATIONS:  1. Hydroxychloroquine 200 mg p.o. b.i.d.  2. Os-Cal with vitamin D 1 tablet p.o. b.i.d.  3. Nexium 40 mg p.o. b.i.d.  4. Gabapentin 300 mg p.o. 4 times a day.  5. Aspirin 81 mg p.o. q. daily.  6. Centrum Silver multivitamin tablet with minerals 1 tablet p.o. q. daily.  7. Toprol 50 mg p.o. q. daily.  8. Prednisone 5 mg p.o. q. daily.  9. Remicade infusion every 8 weeks.  10.  Tandem Iron with vitamin B and C 1 tablet q. daily.  11.  Vitamin D3 1000 International Units p.o. q. daily.  12.  Simvastatin 20 mg p.o. at bedtime.  13.  Zyrtec 10 mg p.o. q. daily.  14.  Xanax 0.25 mg p.o. at bedtime.  15.  Levaquin 750 mg q. daily for 5 more days.   DISCHARGE HOME OXYGEN:  None.   DISCHARGE DIET:  Low-sodium and ADA 1800-calorie diet.   DISCHARGE ACTIVITY:  As tolerated.   FOLLOWUP INSTRUCTIONS:  1. PCP followup in 1-2 weeks.  2. Follow up with Dr. [name] in 2 weeks.   LABORATORIES AND IMAGING STUDIES PRIOR TO DISCHARGE:  WBC 8.0, hemoglobin 9.2, hematocrit 27.7.  Platelet count is 142,000.  Sodium 140, potassium 3.7, chloride 108, bicarbonate 23, BUN 16, creatinine 0.75, glucose of 133, and calcium of 7.6.  Urinalysis negative for any infection.  Chest x-ray prior to discharge showing decreased left perihilar and basilar opacity consistent with improving pneumonia or atelectasis.  Blood cultures are negative so far.  Lactic acid on admission was elevated at 2.3.  WBC on admission was 18.9.     BRIEF HOSPITAL COURSE:  Ms. Schwenke is a 74 year old, very pleasant, Caucasian female with a past medical history significant for hypertension, rheumatoid arthritis, gastroesophageal reflux disease, who presented from home secondary to weakness, fever, hypotension, and hypoxia. 1. Sepsis secondary to left-sided multilobar pneumonia.  The patient was hypotensive.  She was given 2 liters of IV fluid.  She had a high-grade temperature of 101.8 degrees Fahrenheit, and she was also hypoxic requiring 2 liters of oxygen.  Based on chest x-ray results, blood cultures were ordered which came back negative so far.  She was started on IV antibiotics in the hospital with significant clinical improvement.  Chest x-ray followup before discharge shows improving left-sided pneumonia.  She was encouraged to do incentive spirometry.  She was weaned off the oxygen and is being discharged on Levaquin in a stable condition.  Advised to follow up with PCP in 1-2 weeks.  2. Rheumatoid arthritis.  Her home medications were continued.  She is on Remicade infusion every 8 weeks.  One infusion is coming up in the next week.  Was advised to postpone it to the week after to get her pneumonia completely treated.  She will follow up with Dr. Precious Reel for the same.  3. Hypertension.  The patient was hypotensive when she came in.  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  She was supported with Solu-Cortef in the hospital.  Blood pressure improved.  Solu-Cortef is being stopped and the patient can continue on her home prednisone and metoprolol at home.   All her other home medications are being continued without any changes.   DISCHARGE CONDITION:  Stable.   DISCHARGE DISPOSITION:  Home.   TIME SPENT ON DISCHARGE:  Forty-five minutes.    ____________________________ Gladstone Lighter, MD rk:kc D: 11/03/2014 14:35:38 ET T: 11/03/2014 18:52:45  ET JOB#: [id number]  cc: Gladstone Lighter, MD, <Dictator> Meda Klinefelter., MD [name], MD Gladstone Lighter MD ELECTRONICALLY SIGNED 11/06/2014 18:16

## 2014-11-09 NOTE — H&P (Signed)
PATIENT NAME:  Kaitlyn Huff, Kaitlyn Huff MR#:  [id number] DATE OF BIRTH:  04-May-1941  DATE OF ADMISSION:  08/25/2014  REFERRING PHYSICIAN:  Elta Guadeloupe R. Jacqualine Code, MD  PRIMARY CARE PHYSICIAN:  [name], MD   CHIEF COMPLAINT:  Diarrhea and dizziness.   HISTORY OF PRESENT ILLNESS:  This is a 74 year old Caucasian female with a history of rheumatoid arthritis on immunomodulators and chronic steroids, gastroesophageal reflux disease without esophagitis, and recent cholecystectomy back in December 2015, presenting with diarrhea and dizziness. She states she has had intermittent diarrhea, however, it worsened today, and she had 7 episodes today of loose, watery stool without associated abdominal pain. However, she has been describing having subjective chills with noted home temperature of 101 degrees Fahrenheit. For her dizziness, she states that she is mainly lightheaded on going from a sitting to standing position. She has not had an actual syncopal episode, and the symptoms resolve after a few minutes; however, they have been getting progressively worse. She also does attest to having poor p.o. intake.   REVIEW OF SYSTEMS: CONSTITUTIONAL:  Positive for fever, chills, fatigue, and weakness.  EYES:  Denies blurred vision, double vision, or eye pain.  EARS, NOSE, AND THROAT:  Denies tinnitus, ear pain, or hearing loss.  RESPIRATORY:  Denies cough, wheeze, or shortness of breath.  CARDIOVASCULAR:  Denies chest pain, palpitations, or edema.  GASTROINTESTINAL:  Positive for nausea and diarrhea. Denies any vomiting or abdominal pain.  GENITOURINARY:  Denies dysuria or hematuria.  ENDOCRINE:  Denies nocturia or thyroid problems.  HEMATOLOGIC AND LYMPHATIC:  Denies easy bruising or bleeding.  SKIN:  Denies rashes or lesions.  MUSCULOSKELETAL:  Denies pain in the neck, back, shoulders, knees, or hips or arthritic symptoms.  NEUROLOGIC:  Denies paralysis or paresthesias.  PSYCHIATRIC:  Denies anxiety or depressive  symptoms.   Otherwise, full review of systems performed by me is negative.   PAST MEDICAL HISTORY:  Includes essential hypertension, hyperlipidemia unspecified, gastroesophageal reflux disease without esophagitis, and rheumatoid arthritis.   SOCIAL HISTORY:  Denies any alcohol, tobacco, or drug usage.   FAMILY HISTORY:  Denies any known cardiovascular or pulmonary disorders.   ALLERGIES:  AMOXICILLIN, ASTELIN, CIPRO, CODEINE, DILAUDID, DOXYCYCLINE, FLEXERIL, KEFLEX, AND LEFLUNOMIDE, AS WELL AS LISINOPRIL.   HOME MEDICATIONS:  Prednisone 5 mg p.o. daily, aspirin 81 mg p.o. daily, gabapentin 300 mg p.o. 4 times daily, Zyrtec 10 mg p.o. daily, simvastatin 20 mg p.o. at bedtime, Plaquenil 200 mg p.o. b.i.d., metoprolol succinate 50 mg p.o. daily, Lasix 20 mg p.o. daily, Remicade 100 mg IV injection every 8 weeks, potassium 10 mEq 2 tabs b.i.d., Nexium 40 mg p.o. b.i.d., multivitamin 1 tablet daily, Os-Cal plus D 500/125 b.i.d., vitamin B complex 1 tab daily, and vitamin D3, 1000 international units p.o. daily.   PHYSICAL EXAMINATION: VITAL SIGNS:  Temperature 98.2, heart rate 71, respirations 20, blood pressure 97/54, and saturating 97% on room air. Weight 70.3 kg, BMI 25.8.  GENERAL:  Well-nourished, well-developed, Caucasian female currently in no acute distress.  HEAD:  Normocephalic, atraumatic.  EYES:  Pupils are equal, round, and reactive to light. Extraocular muscles are intact. No scleral icterus.  MOUTH:  Moist mucous membranes. Dentition is intact. No abscess noted.  EARS, NOSE, AND THROAT:  Clear without exudate. No external lesions.  NECK:  Supple. No thyromegaly. No nodules. No JVD.  PULMONARY:  Clear to auscultation bilaterally without wheezes, rales, or rhonchi. No use of accessory muscles. Good respiratory effort.  CHEST:  Nontender to palpation.  CARDIOVASCULAR:  S1 and S2, regular rate and rhythm. No murmurs, rubs, or gallops. No edema. Pedal pulses 2+ bilaterally.   GASTROINTESTINAL:  Soft, nontender, nondistended. No masses. Positive bowel sounds. No hepatosplenomegaly.  MUSCULOSKELETAL:  No swelling, clubbing, or edema. Range of motion is full in all extremities.  NEUROLOGIC:  Cranial nerves II through XII are intact. No gross focal neurologic deficits. Sensation is intact. Reflexes are intact.  SKIN:  No ulcerations, lesions, rashes, or cyanosis. Skin is warm and dry. Turgor is intact.  PSYCHIATRIC:  Mood and affect are within normal limits. The patient is awake, alert, and oriented x 3. Insight and judgment are intact.   LABORATORY DATA:  Sodium is 137, potassium 4.1, chloride 100, bicarbonate 31, BUN 18, creatinine 1.19, glucose 115. LFTs are within normal limits. WBC is 20.1, hemoglobin 11.7, platelets 212,000. Urinalysis is negative for evidence of infection.   ASSESSMENT AND PLAN:  This is a 74 year old Caucasian female with history of rheumatoid arthritis, gastroesophageal reflux disease without esophagitis, presenting with diarrhea and dizziness.   1.  Sepsis, meeting septic criteria by temperature, which was recorded at home, as well as leukocytosis. No clear source, however, does have diarrhea. We will check Clostridium difficile and blood cultures. Antibiotic coverage with vancomycin, Levaquin, and aztreonam given her penicillin allergy for now. Adjust antibiotics according to culture data. Intravenous fluid hydration as required to keep mean arterial pressure greater than 65.  2.  Diarrhea, question if this infectious versus postcholecystectomy. We will check Clostridium difficile as stated above. Intravenous fluid hydration. If not infectious, can initiate Imodium for symptom control.  3.  Near syncope. Place on telemetry. Check orthostatic vital signs. Intravenous fluid hydration.  4.  Hyperlipidemia, unspecified. Statin therapy.   5.  Gastroesophageal reflux disease without esophagitis. Proton pump inhibitor therapy. 6.  Venous thromboembolism  prophylaxis with heparin subcutaneously.  CODE STATUS:  The patient is a full code.   TIME SPENT:  45 minutes.    ____________________________ Aaron Mose. Hower, MD dkh:[id number] D: 08/25/2014 21:36:15 ET T: 08/25/2014 21:55:12 ET JOB#: [id number]  cc: Aaron Mose. Hower, MD, <Dictator> DAVID Woodfin Ganja MD ELECTRONICALLY SIGNED 08/26/2014 20:36

## 2014-11-09 NOTE — H&P (Addendum)
PATIENT NAME:  Kaitlyn Huff, Kaitlyn Huff MR#:  [id number] DATE OF BIRTH:  [birth year]  DATE OF ADMISSION:  11/01/2014  PRIMARY CARE PHYSICIAN: [name], M.D.  HOSPITAL COURSE:  The patient is a 74 year old Caucasian female with past medical history significant for history of hypertension, hyperlipidemia, gastroesophageal reflux disease as well as rheumatoid arthritis, who presents to the hospital with complaints of chills as well as weakness.  According to patient,  she was doing well up until today in the morning when she woke up, she had completely no energy.  She had to stay in bed.  She was also complaining of chills, however denied any significant weakness.  She admits to have cough but no significant phlegm production.  She was also short of breath.  She has been also complaining of some tightness in the chest today for the past few hours.  Her pain was described as tightness, comes and goes, with no radiation or alleviating or relieving factors.  She also felt somewhat presyncopal.  She developed nausea and vomiting earlier today and decided to come to the Emergency Room for further evaluation.  In the Emergency Room, she was noted to be hypotensive with systolic blood pressure in the 80s over 50s.  After 2 liters of IV fluids,  the patient's blood pressure remains low in the 80s to 90s over 50s.  Her temperature was as high as 101.8 in the Emergency Room.  Chest x-ray reveals pneumonia.  Hospital services were contacted for admission.   PAST MEDICAL HISTORY:  Significant for history of rheumatoid arthritis on immunomodulators and chronic steroids, gastroesophageal reflux disease without esophagitis, a history of cholecystectomy in December 2015, history of essential hypertension, and hyperlipidemia, nonspecified.   PAST SURGICAL HISTORY:  Cholecystectomy as mentioned above.   SOCIAL HISTORY:  No alcohol, tobacco or drug abuse.   FAMILY HISTORY:  No cardiovascular, pulmonary disorders.  ALLERGIES:   AMOXICILLIN, ASPIRIN, CIPROFLOXACIN, CODEINE, DILAUDID, DOXYCYCLINE, FLEXERIL, KEFLEX, LEFLUNOMIDE AS WELL AS LISINOPRIL.     DISCHARGE MEDICATIONS:  The patient's medication list is as follows:  Aspirin 81 mg p.o. daily, Centrum Silver once daily, gabapentin 300 mg 4 times daily, Hydroxychloroquine 200 mg twice daily, metoprolol succinate 50 mg daily, Nexium 40 mg p.o. twice daily, Os-Cal with vitamin D 500/125, 1 tablet twice daily, potassium chloride 20 mEq once daily, prednisone 5 mg p.o. daily, Remicade 100 mg once every 8 weeks,  simvastatin 20 mg daily at bedtime, vitamin B complex and C as well as folic acid once daily, vitamin D3 1000 units once daily, Xanax 0.25  mg daily at bedtime, and Zyrtec 10 mg p.o. daily.   REVIEW OF SYSTEMS:  GENERAL:  Positive for some blurring of vision which happened 2 days ago after she had some eyedrops.  Admits of having some cough but no significant phlegm production, some shortness of breath, feeling presyncopal, also having chills, having some tightness in the chest, nausea and vomiting earlier today.  Also some abdominal pains where cholecystectomy was performed with cough.  Otherwise, denies any fevers as far as she could be noticed or any other kinds of pains or weight loss. Eyes: Denies any blurry vision, double vision, glaucoma, catharacts.   ENT: Denies any tinnitus, allergies,  epistaxis, difficulty swallowing.  RESPIRATORY:Denies  wheezes, asthma, COPD.  Admits of cough, shortness of breath, chest tightness.  CARDIOVASCULAR:  Denies any orthopnea, edema, arrhythmias, palpitations, or syncope. Admits of chest tightness, feeling presyncopal. GASTROINTESTINAL:  Denies any diarrhea, rectal bleeding, change in bowel habits.  GENITOURINARY:  Denies dysuria, hematuria, frequency, incontinence.  ENDOCRINE:  Denies any thyroid problems, heat or cold intolerance or thirst.   HEMATOLOGIC:  Denies anemia, easy bruising or bleeding, swollen glands.   SKIN: Denies  acne, rashes or change in moles.  MUSCULOSKELETAL:  Denies arthritis, cramps, swelling.  NEUROLOGIC:  Denies numbness, epilepsy or tremor.  PSYCHIATRIC:  Denies anxiety, insomnia, or depression.   PHYSICAL EXAMINATION:  VITAL SIGNS:  On arrival to the hospital the patient's temperature 101.8, pulse 80, respirations 20, blood pressure 86/54, saturation was 96% on room air.  GENERAL:  This is a well-developed, well-nourished Caucasian female, obese, lays on the stretcher comfortable.  HEENT:  Her pupils are equal and reactive to light.  Extra occular movements are intact.   NECK:  No masses.  Supple, nontender.  Thyroid is not enlarged.  No adenopathy.  No JVD or carotid bruits bilaterally.  Full range of motion.  LUNGS:  Clear to auscultation on the right, however the patient does have crackles anteriorly lower part of the lungs on the left.  She also has posteriorly in the upper part of  the lungs a few crackles as well as rales, rhonchi, and rales.  The patient does not have labored inspirations, she is not in overt respiratory distress.  No dullness to percussion.   CARDIOVASCULAR:  S1, S2 appreciated.  Rhythm is regular.  PMI not lateralized.  Chest is nontender to palpation.  1+ pedal pulses.  EXTREMITIES:  No lower extremity edema, calf tenderness, or cyanosis was noted.  ABDOMEN:  Soft, nontender, minimal discomfort was noted in the incision site areas due to gallbladder surgery.  No hepatosplenomegaly or masses were noted.  The bowel sounds are present, but diminished RECTAL:  Deferred. .  MUSCLE STRENGTH:  Able to move all extremities.  No sign of cyanosis, degenerative joint disease or kyphosis.  Gait was not tested.  SKIN:  Did not reveal any rashes, lesions, erythema, nodularity, or induration.  It was warm and dry to palpation.  LYMPHATIC:  No adenopathy in the cervical region.  NEUROLOGIC:  Cranial nerves grossly intact.  Sensory is intact.  No dysarthria or aphasia.  The patient is  alert, oriented to time, person and place, cooperative.  Memory is good.  No significant confusion, agitation, or depression.   DIAGNOSTIC DATA:  EKG reveals normal sinus rhythm at 75 beats per minutes, normal axis, nonspecific ST-T changes.  The patient's radiologic studies showed chest x-ray portable single view on 09/02/2014 showed left mid and lower lung zone consolidation.  Short term followup radiographs were recommended to ensure complete resolution.  Laboratory studies revealed glucose 129, creatinine 1.08, sodium 132, otherwise BMP was unremarkable.  The patient's lactic acid level is elevated at 2.3.  Liver enzymes were normal.  Troponin is elevated at 0.09.  White blood cell count is elevated at 13.9, hemoglobin 11.9, platelet count was 198,000.  Absolute neutrophil count 15.4.  Coagulation panel unremarkable.  Influenza testing was negative.    ASSESSMENT AND PLAN:  1.  Sepsis with septic shock.  Admit patient to medical floor, critical care unit.  Continue patient on high rate IV fluids.  Get central line placed and then start the patient on IV Levophed keeping her MAP at around 65 and above following urinary output closely.  Continue the patient on high rate IV fluids.  2.  Right middle lobe as well as right lower lobe pneumonia.  Continue the patient on antibiotic therapy with Aztreonam, Levofloxacin.  Get sputum  cultures and adjust antibiotic regimen  according to culture results.  3.  Acute renal insufficiency.  Continue the patient on IV fluids.  Follow the patient's kidney function in the morning.  4.  Hyponatremia.  IV fluids.  Follow sodium level in the morning.  5.  Lactic acidosis likely due to hypoperfusion due to hypertension.  Follow with therapy.  6.  Elevated troponin, likely demand ischemia.  Continue the patient on supportive therapy at this time.  No beta blockers or nitroglycerin possible.  Will also initiate the patient on aspirin therapy.  8.  Leukocytosis.  Follow with  therapy.  9.  Anemia.  Seems to be stable since prior.  Get guaiac.    TIME SPENT:  50 minutes.      ____________________________ Theodoro Grist, MD [id number] D: 11/01/2014 18:44:27 ET T: 11/01/2014 20:36:45 ET JOB#: [id number]  cc: Theodoro Grist, MD, <Dictator> [name], MD [place] MD ELECTRONICALLY SIGNED 11/08/2014 20:12

## 2014-11-09 NOTE — Discharge Summary (Signed)
PATIENT NAME:  Kaitlyn Huff, Kaitlyn Huff MR#:  [id number] DATE OF BIRTH:  March 10, 1941  DATE OF ADMISSION:  08/25/2014 DATE OF DISCHARGE:  08/26/2014  PRIMARY CARE PHYSICIAN: Elon Alas. Nicki Reaper, MD   DISCHARGE DIAGNOSES: Sepsis, possible acute enteritis. near syncope. Hypertension. Hyperlipidemia.   CONDITION: Stable.   CODE STATUS: Full code.   HOME MEDICATIONS: Please refer to the medication reconciliation list. Lasix 20 mg was discontinued due to low blood pressure.   DIET: Low-sodium, low-fat, low-cholesterol diet.   ACTIVITY: As tolerated.   FOLLOWUP CARE: Follow up with PCP within 1-2 weeks.   REASON FOR ADMISSION: Diarrhea and dizziness.   HOSPITAL COURSE: The patient is a 74 year old Caucasian female with a history of rheumatoid arthritis on immunomodulators and chronic steroid, presented to the ED with diarrhea and dizziness with a fever of 101. For detailed history and physical examination, please refer to the admission note dictated by Dr. Lavetta Nielsen. The patient's WBC was 20.1, hemoglobin 11.7. Electrolytes were normal.  Liver function tests were normal.  First, sepsis, unclear etiology, but possibly due to diarrhea with possible acute enteritis. After admission, the patient has been treated with the vancomycin, Levaquin and Azactam. The patient has no diarrhea or bowel movement after admission. The patient has no complaints. WBC decreased from 20.1 to 12.8. This patient has no bowel movement, we cannot send stool test for Clostridium difficile. The patient wants to go home. Since the patient is clinically stable, the patient will be discharged to home today. I discussed the patient's condition and the plan of treatment with the patient and the patient's daughter. The patient needs to follow up with PCP. Discussed with nurse and case manager.   TIME SPENT: About 36 minutes.    ____________________________ Demetrios Loll, MD qc:TM D: 08/26/2014 16:42:45 ET T: 08/26/2014 23:51:52  ET JOB#: [id number]  cc: Demetrios Loll, MD, <Dictator> Demetrios Loll MD ELECTRONICALLY SIGNED 08/27/2014 17:44

## 2014-11-09 NOTE — Assessment & Plan Note (Signed)
Just discharged after beig admitted with pneumonia and sepsis.  Doing better.  Complete abx.  Will need f/u cxr in 3-4 weeks.  Order given.

## 2014-11-09 NOTE — Assessment & Plan Note (Signed)
Blood pressure as outlined.  Check metabolic panel.  If renal function ok, will plan to restart lasix and potassium.  Follow.

## 2014-11-10 ENCOUNTER — Telehealth: Payer: Self-pay | Admitting: *Deleted

## 2014-11-10 ENCOUNTER — Ambulatory Visit: Admitting: Nurse Practitioner

## 2014-11-10 NOTE — Telephone Encounter (Signed)
Updated med list per Dr. Nicki Reaper instructions (see result note)

## 2014-11-20 ENCOUNTER — Other Ambulatory Visit (INDEPENDENT_AMBULATORY_CARE_PROVIDER_SITE_OTHER): Payer: Medicare Other

## 2014-11-20 ENCOUNTER — Other Ambulatory Visit: Payer: Self-pay | Admitting: Internal Medicine

## 2014-11-20 DIAGNOSIS — D649 Anemia, unspecified: Secondary | ICD-10-CM

## 2014-11-20 DIAGNOSIS — M069 Rheumatoid arthritis, unspecified: Secondary | ICD-10-CM

## 2014-11-20 DIAGNOSIS — E876 Hypokalemia: Secondary | ICD-10-CM

## 2014-11-20 LAB — BASIC METABOLIC PANEL
BUN: 17 mg/dL (ref 6–23)
CO2: 32 mEq/L (ref 19–32)
Calcium: 9.4 mg/dL (ref 8.4–10.5)
Chloride: 101 mEq/L (ref 96–112)
Creatinine, Ser: 1.03 mg/dL (ref 0.40–1.20)
GFR: 56.05 mL/min — ABNORMAL LOW (ref >60.00)
GLUCOSE: 108 mg/dL — AB (ref 70–99)
POTASSIUM: 4.2 meq/L (ref 3.5–5.1)
Sodium: 137 mEq/L (ref 135–145)

## 2014-11-20 LAB — HEMOGLOBIN: HEMOGLOBIN: 12.1 g/dL (ref 12.0–15.0)

## 2014-11-20 NOTE — Progress Notes (Signed)
Order placed for f/u met b.  

## 2014-11-21 ENCOUNTER — Other Ambulatory Visit

## 2014-11-21 ENCOUNTER — Telehealth: Payer: Self-pay | Admitting: *Deleted

## 2014-11-21 NOTE — Telephone Encounter (Signed)
Updated Potassium directions

## 2014-11-21 NOTE — Telephone Encounter (Signed)
-----   Message from [name], MD sent at 11/20/2014  4:37 PM EDT ----- Notify pt that her potassium is wnl.  Per last lab note she reported taking potassium 68meq qid.  If this is correct, I want her to decrease to q day.  Confirm still on lasix.  hgb is now wnl.  Recheck potassium in one week.

## 2014-11-27 ENCOUNTER — Other Ambulatory Visit (INDEPENDENT_AMBULATORY_CARE_PROVIDER_SITE_OTHER): Payer: Medicare Other

## 2014-11-27 DIAGNOSIS — M069 Rheumatoid arthritis, unspecified: Secondary | ICD-10-CM | POA: Diagnosis not present

## 2014-11-27 LAB — BASIC METABOLIC PANEL
BUN: 12 mg/dL (ref 6–23)
CO2: 31 meq/L (ref 19–32)
Calcium: 9.5 mg/dL (ref 8.4–10.5)
Chloride: 102 mEq/L (ref 96–112)
Creatinine, Ser: 0.99 mg/dL (ref 0.40–1.20)
GFR: 58.67 mL/min — ABNORMAL LOW (ref >60.00)
GLUCOSE: 95 mg/dL (ref 70–99)
POTASSIUM: 4 meq/L (ref 3.5–5.1)
SODIUM: 138 meq/L (ref 135–145)

## 2014-11-30 ENCOUNTER — Other Ambulatory Visit: Payer: Self-pay | Admitting: Internal Medicine

## 2014-12-02 DIAGNOSIS — M0589 Other rheumatoid arthritis with rheumatoid factor of multiple sites: Secondary | ICD-10-CM | POA: Diagnosis not present

## 2014-12-02 DIAGNOSIS — M05762 Rheumatoid arthritis with rheumatoid factor of left knee without organ or systems involvement: Secondary | ICD-10-CM | POA: Diagnosis not present

## 2014-12-02 DIAGNOSIS — M05761 Rheumatoid arthritis with rheumatoid factor of right knee without organ or systems involvement: Secondary | ICD-10-CM | POA: Diagnosis not present

## 2014-12-16 ENCOUNTER — Other Ambulatory Visit: Payer: Self-pay | Admitting: Internal Medicine

## 2014-12-23 DIAGNOSIS — I10 Essential (primary) hypertension: Secondary | ICD-10-CM | POA: Diagnosis not present

## 2014-12-23 DIAGNOSIS — Z8679 Personal history of other diseases of the circulatory system: Secondary | ICD-10-CM | POA: Diagnosis not present

## 2014-12-23 DIAGNOSIS — I059 Rheumatic mitral valve disease, unspecified: Secondary | ICD-10-CM | POA: Diagnosis not present

## 2014-12-23 DIAGNOSIS — I38 Endocarditis, valve unspecified: Secondary | ICD-10-CM | POA: Diagnosis not present

## 2014-12-31 ENCOUNTER — Other Ambulatory Visit: Payer: Self-pay | Admitting: Internal Medicine

## 2015-01-09 ENCOUNTER — Ambulatory Visit (INDEPENDENT_AMBULATORY_CARE_PROVIDER_SITE_OTHER): Payer: Medicare Other | Admitting: Internal Medicine

## 2015-01-09 ENCOUNTER — Encounter: Payer: Self-pay | Admitting: Internal Medicine

## 2015-01-09 VITALS — BP 133/77 | HR 62 | Temp 98.7°F | Resp 16 | Ht 64.5 in | Wt 167.0 lb

## 2015-01-09 DIAGNOSIS — D649 Anemia, unspecified: Secondary | ICD-10-CM

## 2015-01-09 DIAGNOSIS — J189 Pneumonia, unspecified organism: Secondary | ICD-10-CM | POA: Diagnosis not present

## 2015-01-09 DIAGNOSIS — K838 Other specified diseases of biliary tract: Secondary | ICD-10-CM

## 2015-01-09 DIAGNOSIS — R7989 Other specified abnormal findings of blood chemistry: Secondary | ICD-10-CM

## 2015-01-09 DIAGNOSIS — Z0000 Encounter for general adult medical examination without abnormal findings: Secondary | ICD-10-CM

## 2015-01-09 DIAGNOSIS — R945 Abnormal results of liver function studies: Secondary | ICD-10-CM

## 2015-01-09 DIAGNOSIS — K219 Gastro-esophageal reflux disease without esophagitis: Secondary | ICD-10-CM

## 2015-01-09 DIAGNOSIS — R9389 Abnormal findings on diagnostic imaging of other specified body structures: Secondary | ICD-10-CM

## 2015-01-09 DIAGNOSIS — I10 Essential (primary) hypertension: Secondary | ICD-10-CM | POA: Diagnosis not present

## 2015-01-09 DIAGNOSIS — F439 Reaction to severe stress, unspecified: Secondary | ICD-10-CM

## 2015-01-09 DIAGNOSIS — R938 Abnormal findings on diagnostic imaging of other specified body structures: Secondary | ICD-10-CM | POA: Diagnosis not present

## 2015-01-09 DIAGNOSIS — R220 Localized swelling, mass and lump, head: Secondary | ICD-10-CM

## 2015-01-09 DIAGNOSIS — Z658 Other specified problems related to psychosocial circumstances: Secondary | ICD-10-CM

## 2015-01-09 DIAGNOSIS — E7800 Pure hypercholesterolemia, unspecified: Secondary | ICD-10-CM

## 2015-01-09 DIAGNOSIS — Z1239 Encounter for other screening for malignant neoplasm of breast: Secondary | ICD-10-CM

## 2015-01-09 DIAGNOSIS — M069 Rheumatoid arthritis, unspecified: Secondary | ICD-10-CM

## 2015-01-09 NOTE — Progress Notes (Signed)
Pre visit review using our clinic review tool, if applicable. No additional management support is needed unless otherwise documented below in the visit note. 

## 2015-01-11 ENCOUNTER — Encounter: Payer: Self-pay | Admitting: Internal Medicine

## 2015-01-11 DIAGNOSIS — Z0000 Encounter for general adult medical examination without abnormal findings: Secondary | ICD-10-CM | POA: Insufficient documentation

## 2015-01-11 DIAGNOSIS — R220 Localized swelling, mass and lump, head: Secondary | ICD-10-CM | POA: Insufficient documentation

## 2015-01-11 NOTE — Assessment & Plan Note (Signed)
Controlled.  

## 2015-01-11 NOTE — Assessment & Plan Note (Signed)
Needs a physical.  Overdue mammogram.

## 2015-01-11 NOTE — Assessment & Plan Note (Signed)
Seeing rheumatology

## 2015-01-11 NOTE — Assessment & Plan Note (Signed)
Follow cbc and ferritin.  

## 2015-01-11 NOTE — Assessment & Plan Note (Signed)
Referred to GI.  They felt no further w/up warranted.

## 2015-01-11 NOTE — Assessment & Plan Note (Signed)
Blood pressure doing well.  Same medication regimen.  Follow pressures.  Follow metabolic panel.   

## 2015-01-11 NOTE — Assessment & Plan Note (Signed)
Does not feel she needs anything more at this time.  Follow.

## 2015-01-11 NOTE — Assessment & Plan Note (Signed)
Abdominal ultrasound revealed fatty liver.  Saw GI.  See there note for details.  Follow liver panel.

## 2015-01-11 NOTE — Progress Notes (Signed)
Patient ID: Kaitlyn Huff, female   DOB: July 09, 1941, 74 y.o.   MRN: [id number]   Subjective:    Patient ID: Kaitlyn Huff, female    DOB: Apr 11, 1941, 74 y.o.   MRN: [id number]  HPI  Patient here for a scheduled follow up.  She is feeling better.  Was admitted in 10/2014 with pneumonia.  Has not had her f/u cxr.  She feels better.  Is eating.  No sob.  No cough or congestion.  No cardiac symptoms with increased activity or exertion.  Energy is better.  Bowels stable.  Had recent flare with her joints.  On a prednisone taper.  Noticed some swelling in her left cheek.  Is better today.  No tongue swelling.  No sob or difficulty breathing.  No new exposures.     Past Medical History  Diagnosis Date  . Rheumatoid arthritis(714.0)     MTX transaminitis, Leflunomide (rash), enbrel, plaquinil, prednisone, remicade, Imuran (transaminitis)  . Hyperlipidemia   . Diverticulitis   . Osteoarthritis   . GERD (gastroesophageal reflux disease)   . Hypertension   . Anemia   . Positive PPD     s/p INH (2006)  . Osteoporosis     actonel  . 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 alendronate (FOSAMAX) 70 MG tablet Take 70 mg by mouth once a week. Take with a full glass of water on an empty stomach.    . ALPRAZolam (XANAX) 0.25 MG tablet 1 tablet q day prn 90 tablet 0  . aspirin 81 MG tablet Take 81 mg by mouth daily.    . calcium-vitamin D (OSCAL 500/200 D-3) 500-200 MG-UNIT per tablet Take 1 tablet by mouth 2 (two) times daily.    . cetirizine (ZYRTEC) 10 MG tablet TAKE 1 TABLET DAILY 90 tablet 3  . FeFum-FePo-FA-B Cmp-C-Zn-Mn-Cu (TANDEM PLUS) 162-115.2-1 MG CAPS TAKE 1 CAPSULE DAILY 90 each 3  . Fluticasone-Salmeterol (ADVAIR DISKUS) 250-50 MCG/DOSE AEPB INHALE 1 PUFF INTO THE LUNGS EVERY 12 (TWELVE) HOURS 3 each 1  . furosemide (LASIX) 20 MG tablet TAKE 1 TABLET DAILY 90 tablet 0  . gabapentin  (NEURONTIN) 300 MG capsule Take 300 mg by mouth 4 (four) times daily.    . hydroxychloroquine (PLAQUENIL) 200 MG tablet Take 1 tablet (200 mg total) by mouth 2 (two) times daily. 180 tablet 1  . InFLIXimab (REMICADE IV) Inject every 6 weeks    . metoprolol succinate (TOPROL-XL) 50 MG 24 hr tablet TAKE 1 TABLET (50 MG TOTAL) DAILY. TAKE WITH OR IMMEDIATELY FOLLOWING A MEAL 90 tablet 2  . Multiple Vitamin (MULTIVITAMIN) capsule Take 1 capsule by mouth daily.    Marland Kitchen NEXIUM 40 MG capsule TAKE 1 CAPSULE (40 MG TOTAL) TWICE A DAY BEFORE A MEAL 180 capsule 3  . potassium chloride (K-DUR) 10 MEQ tablet Take 10 mEq by mouth once.     . predniSONE (DELTASONE) 5 MG tablet Take 5 mg by mouth daily.  0  . simvastatin (ZOCOR) 20 MG tablet TAKE 1 TABLET DAILY 90 tablet 1  . sodium chloride (OCEAN) 0.65 % nasal spray Place 1 spray into the nose as needed for congestion.    . traMADol (ULTRAM) 50 MG tablet Take 1 tablet (50 mg total) by mouth at bedtime as needed. 30 tablet 0  . Diphenhyd-Hydrocort-Nystatin (FIRST-DUKES MOUTHWASH) SUSP 5-10 cc's swish and spit tid (Patient  not taking: Reported on 01/09/2015) 237 mL 0   No current facility-administered medications on file prior to visit.    Review of Systems  Constitutional: Negative for appetite change and unexpected weight change.       Energy is better.    HENT: Negative for congestion and sinus pressure.   Respiratory: Negative for cough, chest tightness and shortness of breath.   Cardiovascular: Negative for chest pain, palpitations and leg swelling.  Gastrointestinal: Negative for nausea, vomiting, abdominal pain and diarrhea.  Genitourinary: Negative for dysuria and difficulty urinating.  Musculoskeletal: Positive for joint swelling.  Skin: Negative for color change and rash.  Neurological: Negative for dizziness, light-headedness and headaches.  Hematological: Negative for adenopathy. Does not bruise/bleed easily.  Psychiatric/Behavioral: Negative for  dysphoric mood and agitation.       Objective:    Physical Exam  Constitutional: She appears well-developed and well-nourished. No distress.  HENT:  Nose: Nose normal.  Mouth/Throat: Oropharynx is clear and moist.  Neck: Neck supple. No thyromegaly present.  Cardiovascular: Normal rate and regular rhythm.   Pulmonary/Chest: Breath sounds normal. No respiratory distress. She has no wheezes.  Abdominal: Soft. Bowel sounds are normal. There is no tenderness.  Musculoskeletal: She exhibits no edema or tenderness.  Lymphadenopathy:    She has no cervical adenopathy.  Skin: No rash noted. No erythema.  Psychiatric: She has a normal mood and affect. Her behavior is normal.    BP 133/77 mmHg  Pulse 62  Temp(Src) 98.7 F (37.1 C) (Oral)  Resp 16  Ht 5' 4.5" (1.638 m)  Wt 167 lb (75.751 kg)  BMI 28.23 kg/m2  SpO2 93% Wt Readings from Last 3 Encounters:  01/09/15 167 lb (75.751 kg)  11/07/14 164 lb 4 oz (74.503 kg)  09/01/14 158 lb (71.668 kg)     Lab Results  Component Value Date   WBC 7.1 11/07/2014   HGB 12.1 11/20/2014   HCT 32.1* 11/07/2014   PLT 210.0 11/07/2014   GLUCOSE 95 11/27/2014   CHOL 133 07/28/2014   TRIG 130.0 07/28/2014   HDL 56.20 07/28/2014   LDLDIRECT 73.7 08/20/2013   LDLCALC 51 07/28/2014   ALT 27 11/01/2014   AST 35 11/01/2014   NA 138 11/27/2014   K 4.0 11/27/2014   CL 102 11/27/2014   CREATININE 0.99 11/27/2014   BUN 12 11/27/2014   CO2 31 11/27/2014   TSH 4.13 01/24/2014   INR 1.0 11/01/2014    Dg Chest Port 1 View  11/01/2014   CLINICAL DATA:  Nausea, vomiting, and weakness  EXAM: PORTABLE CHEST - 1 VIEW  COMPARISON:  None.  FINDINGS: The heart is mildly enlarged. Left perihilar and basilar consolidation is present. Right lung is grossly clear. No pneumothorax. No pleural effusion.  IMPRESSION: Left mid and lower lung zone consolidation. Short-term follow-up radiographs are recommended to ensure resolution.   Electronically Signed   By:  Marybelle Killings M.D.   On: 11/01/2014 16:21       Assessment & Plan:   Problem List Items Addressed This Visit    Abnormal liver function test    Abdominal ultrasound revealed fatty liver.  Saw GI.  See there note for details.  Follow liver panel.       Relevant Orders   Hepatic function panel   Anemia    Follow cbc and ferritin.       Dilated bile duct    Referred to GI.  They felt no further w/up warranted.  GERD (gastroesophageal reflux disease)    Controlled.        Health care maintenance    Needs a physical.  Overdue mammogram.        Hypercholesterolemia    Low cholesterol diet and exercise.  On simvastatin.  Follow lipid panel and liver function tests.        Relevant Orders   Lipid panel   Hypertension    Blood pressure doing well.  Same medication regimen.  Follow pressures.  Follow metabolic panel.        Relevant Orders   TSH   Basic metabolic panel   Pneumonia    Recently admitted for pneumonia.  Is better.  No sob.  No cough or congestion.  Never had her f/u cxr.  She will go next week for f/u cxr.        Rheumatoid arthritis    Seeing rheumatology.        Relevant Orders   CBC with Differential/Platelet   Stress    Does not feel she needs anything more at this time.  Follow.       Swelling, cheek    Appears to be doing better.  On prednisone taper.  Follow.  Taking an antihistamine.  Follow.  If any worsening, she is to be evaluated.  No evidence of infection.        Other Visit Diagnoses    Abnormal CXR    -  Primary    Relevant Orders    DG Chest 2 View    Breast cancer screening        Relevant Orders    MM DIGITAL SCREENING BILATERAL      I spent 25 minutes with the patient and more than 50% of the time was spent in consultation regarding the above.     [name], MD

## 2015-01-11 NOTE — Assessment & Plan Note (Signed)
Appears to be doing better.  On prednisone taper.  Follow.  Taking an antihistamine.  Follow.  If any worsening, she is to be evaluated.  No evidence of infection.

## 2015-01-11 NOTE — Assessment & Plan Note (Signed)
Low cholesterol diet and exercise.  On simvastatin.  Follow lipid panel and liver function tests.   

## 2015-01-11 NOTE — Assessment & Plan Note (Signed)
Recently admitted for pneumonia.  Is better.  No sob.  No cough or congestion.  Never had her f/u cxr.  She will go next week for f/u cxr.

## 2015-01-14 ENCOUNTER — Other Ambulatory Visit: Payer: Self-pay | Admitting: Internal Medicine

## 2015-01-14 ENCOUNTER — Ambulatory Visit
Admission: RE | Admit: 2015-01-14 | Discharge: 2015-01-14 | Disposition: A | Discharge status: Home / Self Care | Payer: Medicare Other | Source: Ambulatory Visit | Attending: Internal Medicine | Admitting: Internal Medicine

## 2015-01-14 DIAGNOSIS — I517 Cardiomegaly: Secondary | ICD-10-CM | POA: Diagnosis not present

## 2015-01-14 DIAGNOSIS — Z1239 Encounter for other screening for malignant neoplasm of breast: Secondary | ICD-10-CM

## 2015-01-14 DIAGNOSIS — Z8701 Personal history of pneumonia (recurrent): Secondary | ICD-10-CM | POA: Insufficient documentation

## 2015-01-14 DIAGNOSIS — R9389 Abnormal findings on diagnostic imaging of other specified body structures: Secondary | ICD-10-CM

## 2015-01-14 DIAGNOSIS — R938 Abnormal findings on diagnostic imaging of other specified body structures: Secondary | ICD-10-CM | POA: Diagnosis not present

## 2015-01-14 DIAGNOSIS — Z1231 Encounter for screening mammogram for malignant neoplasm of breast: Secondary | ICD-10-CM | POA: Diagnosis present

## 2015-01-19 ENCOUNTER — Other Ambulatory Visit: Payer: Self-pay

## 2015-01-19 MED ORDER — ALPRAZOLAM 0.25 MG PO TABS: ORAL_TABLET | ORAL | Status: DC

## 2015-01-19 NOTE — Telephone Encounter (Signed)
Duplicate- see other message

## 2015-01-19 NOTE — Telephone Encounter (Signed)
I refilled the alprazolam #90 with no refills.  Regarding the tramadol, if having increased hip pain - she sees Dr [name] for her arthritis.  I would like for her to let him know of her increased pain and see what treatment warranted.

## 2015-01-19 NOTE — Telephone Encounter (Signed)
Patient requesting refill through [facility] for Tramadol.  Originally ordered on 09/01/14 by NP for Hip pain.  Please advise?

## 2015-01-22 ENCOUNTER — Telehealth: Payer: Self-pay | Admitting: *Deleted

## 2015-01-22 NOTE — Telephone Encounter (Signed)
Pt called requesting Alprazolam refill.  Pt advised Alprazolam Rx faxed to pharmacy on 7.11.16.  Pt verbalized understanding

## 2015-01-27 DIAGNOSIS — M05761 Rheumatoid arthritis with rheumatoid factor of right knee without organ or systems involvement: Secondary | ICD-10-CM | POA: Diagnosis not present

## 2015-01-27 DIAGNOSIS — M0579 Rheumatoid arthritis with rheumatoid factor of multiple sites without organ or systems involvement: Secondary | ICD-10-CM | POA: Diagnosis not present

## 2015-01-27 DIAGNOSIS — M05762 Rheumatoid arthritis with rheumatoid factor of left knee without organ or systems involvement: Secondary | ICD-10-CM | POA: Diagnosis not present

## 2015-03-14 ENCOUNTER — Other Ambulatory Visit: Payer: Self-pay | Admitting: Internal Medicine

## 2015-03-18 ENCOUNTER — Encounter: Payer: Self-pay | Admitting: Family Medicine

## 2015-03-18 ENCOUNTER — Telehealth: Payer: Self-pay | Admitting: *Deleted

## 2015-03-18 ENCOUNTER — Ambulatory Visit (INDEPENDENT_AMBULATORY_CARE_PROVIDER_SITE_OTHER): Payer: Medicare Other | Admitting: Family Medicine

## 2015-03-18 VITALS — BP 120/70 | HR 71 | Temp 99.0°F | Ht 64.5 in | Wt 170.1 lb

## 2015-03-18 DIAGNOSIS — R197 Diarrhea, unspecified: Secondary | ICD-10-CM | POA: Diagnosis not present

## 2015-03-18 MED ORDER — ALPRAZOLAM 0.25 MG PO TABS: ORAL_TABLET | ORAL | Status: DC

## 2015-03-18 NOTE — Patient Instructions (Signed)
It was nice to see you today.  We will call results of your labs.  Take a daily probiotic (Align, [place], [place]).  Follow up with Dr. Nicki Reaper.  Take care  Dr. Lacinda Axon

## 2015-03-18 NOTE — Telephone Encounter (Signed)
Patient has requested to see Dr Nicki Reaper with in the next week,please advise where to place her on the schedule-Thanks

## 2015-03-18 NOTE — Telephone Encounter (Signed)
See if she can come in at 12:00 on 03/26/15 - if no one else scheduled to come in then.  Thanks

## 2015-03-18 NOTE — Telephone Encounter (Signed)
Please advise, patient saw Dr. Lacinda Axon today.

## 2015-03-19 ENCOUNTER — Other Ambulatory Visit: Payer: Self-pay | Admitting: *Deleted

## 2015-03-19 LAB — CBC
HEMATOCRIT: 35.4 % — AB (ref 36.0–46.0)
HEMOGLOBIN: 11.9 g/dL — AB (ref 12.0–15.0)
MCHC: 33.5 g/dL (ref 30.0–36.0)
MCV: 94.6 fl (ref 78.0–100.0)
PLATELETS: 266 10*3/uL (ref 150.0–400.0)
RBC: 3.74 Mil/uL — ABNORMAL LOW (ref 3.87–5.11)
RDW: 13.3 % (ref 11.5–15.5)
WBC: 7.7 10*3/uL (ref 4.0–10.5)

## 2015-03-19 LAB — COMPREHENSIVE METABOLIC PANEL
ALT: 27 U/L (ref 0–35)
AST: 34 U/L (ref 0–37)
Albumin: 3.8 g/dL (ref 3.5–5.2)
Alkaline Phosphatase: 42 U/L (ref 39–117)
BILIRUBIN TOTAL: 0.5 mg/dL (ref 0.2–1.2)
BUN: 7 mg/dL (ref 6–23)
CO2: 30 meq/L (ref 19–32)
Calcium: 9.1 mg/dL (ref 8.4–10.5)
Chloride: 102 mEq/L (ref 96–112)
Creatinine, Ser: 1.07 mg/dL (ref 0.40–1.20)
GFR: 53.59 mL/min — AB (ref >60.00)
Glucose, Bld: 84 mg/dL (ref 70–99)
POTASSIUM: 4.2 meq/L (ref 3.5–5.1)
Sodium: 139 mEq/L (ref 135–145)
Total Protein: 7.2 g/dL (ref 6.0–8.3)

## 2015-03-19 MED ORDER — FIRST-DUKES MOUTHWASH MT SUSP: OROMUCOSAL | Status: DC

## 2015-03-19 NOTE — Assessment & Plan Note (Signed)
Unclear etiology. Patient reports it was present prior to cholecystectomy. This was not an issue per her last PCP visit (bowel movements were reportedly stable). Obtaining C diff and CBC, CMP today. Will consider bile acid sequestrant if work up is negative.

## 2015-03-19 NOTE — Progress Notes (Signed)
   Subjective:  Patient ID: Kaitlyn Huff, female    DOB: [birth year]  Age: 74 y.o. MRN: [id number]  CC: Diarrhea  HPI:  74 year old female with RA presents to the clinic today with complaints of diarrhea.  1) Diarrhea  Has been present for months (worse over the past few months); she reports it was present even prior to recent GB surgery (Dec 2015).  Occurs ~ 3 times a day; everyday.  Stool has no form at all.   She reports it is foul smelling.   She has had recent antibiotics as she was hospitalized and treated for PNA in April.  No relieving factors.  No reported fever, chills. She denies abdominal pain.  Patient and daughter are concerned about infection.   Social Hx - Nonsmoker.  Review of Systems  Constitutional: Negative for fever.  Gastrointestinal: Positive for diarrhea. Negative for abdominal pain.   Objective:  BP 120/70 mmHg  Pulse 71  Temp(Src) 99 F (37.2 C) (Oral)  Ht 5' 4.5" (1.638 m)  Wt 170 lb 2 oz (77.168 kg)  BMI 28.76 kg/m2  SpO2 91%  BP/Weight 03/18/2015 01/09/2015 12/21/2447  Systolic BP 753 005 110  Diastolic BP 70 77 80  Wt. (Lbs) 170.13 167 164.25  BMI 28.76 28.23 27.77   Physical Exam  Constitutional: She appears well-developed and well-nourished. No distress.  Eyes: No scleral icterus.  Cardiovascular: Normal rate and regular rhythm.   Pulmonary/Chest: Effort normal and breath sounds normal. No respiratory distress. She has no wheezes. She has no rales.  Abdominal: Soft. She exhibits no distension. There is no tenderness. There is no rebound and no guarding.  Neurological: She is alert.  Psychiatric: She has a normal mood and affect.    Lab Results  Component Value Date   WBC 7.1 11/07/2014   HGB 12.1 11/20/2014   HCT 32.1* 11/07/2014   PLT 210.0 11/07/2014   GLUCOSE 95 11/27/2014   CHOL 133 07/28/2014   TRIG 130.0 07/28/2014   HDL 56.20 07/28/2014   LDLDIRECT 73.7 08/20/2013   LDLCALC 51 07/28/2014   ALT 27 11/01/2014   AST 35 11/01/2014   NA 138 11/27/2014   K 4.0 11/27/2014   CL 102 11/27/2014   CREATININE 0.99 11/27/2014   BUN 12 11/27/2014   CO2 31 11/27/2014   TSH 4.13 01/24/2014   INR 1.0 11/01/2014    Assessment & Plan:   Problem List Items Addressed This Visit    Diarrhea - Primary    Unclear etiology. Patient reports it was present prior to cholecystectomy. This was not an issue per her last PCP visit (bowel movements were reportedly stable). Obtaining C diff and CBC, CMP today. Will consider bile acid sequestrant if work up is negative.       Relevant Orders   CBC   Comprehensive metabolic panel   Clostridium Difficile by PCR      Meds ordered this encounter  Medications  . ALPRAZolam (XANAX) 0.25 MG tablet    Sig: 1 tablet q day prn    Dispense:  90 tablet    Refill:  0    Follow-up: With PCP as directed or sooner if needed  Thersa Salt, DO

## 2015-03-23 NOTE — Addendum Note (Signed)
Addended by: Karlene Einstein D on: 03/23/2015 12:01 PM   Modules accepted: Orders

## 2015-03-24 LAB — CLOSTRIDIUM DIFFICILE BY PCR: CDIFFPCR: NOT DETECTED

## 2015-03-25 NOTE — Progress Notes (Signed)
Likely from recent gall bladder surgery. Follow up with PCP.

## 2015-03-30 ENCOUNTER — Telehealth: Payer: Self-pay | Admitting: *Deleted

## 2015-03-30 ENCOUNTER — Emergency Department
Admission: EM | Admit: 2015-03-30 | Discharge: 2015-03-31 | Discharge status: Against Medical Advice | Payer: Medicare Other | Attending: Emergency Medicine | Admitting: Emergency Medicine

## 2015-03-30 ENCOUNTER — Encounter: Payer: Self-pay | Admitting: Emergency Medicine

## 2015-03-30 DIAGNOSIS — M79602 Pain in left arm: Secondary | ICD-10-CM | POA: Insufficient documentation

## 2015-03-30 DIAGNOSIS — M542 Cervicalgia: Secondary | ICD-10-CM | POA: Insufficient documentation

## 2015-03-30 DIAGNOSIS — R05 Cough: Secondary | ICD-10-CM | POA: Insufficient documentation

## 2015-03-30 DIAGNOSIS — R51 Headache: Secondary | ICD-10-CM | POA: Diagnosis not present

## 2015-03-30 DIAGNOSIS — R197 Diarrhea, unspecified: Secondary | ICD-10-CM | POA: Insufficient documentation

## 2015-03-30 DIAGNOSIS — M79601 Pain in right arm: Secondary | ICD-10-CM | POA: Insufficient documentation

## 2015-03-30 DIAGNOSIS — M79641 Pain in right hand: Secondary | ICD-10-CM | POA: Diagnosis not present

## 2015-03-30 DIAGNOSIS — M79642 Pain in left hand: Secondary | ICD-10-CM | POA: Diagnosis not present

## 2015-03-30 DIAGNOSIS — I10 Essential (primary) hypertension: Secondary | ICD-10-CM | POA: Insufficient documentation

## 2015-03-30 DIAGNOSIS — R0789 Other chest pain: Secondary | ICD-10-CM | POA: Diagnosis not present

## 2015-03-30 LAB — URINALYSIS COMPLETE WITH MICROSCOPIC (ARMC ONLY)
Bilirubin Urine: NEGATIVE
Glucose, UA: NEGATIVE mg/dL
HGB URINE DIPSTICK: NEGATIVE
Ketones, ur: NEGATIVE mg/dL
Leukocytes, UA: NEGATIVE
Nitrite: NEGATIVE
PH: 5 (ref 5.0–8.0)
Protein, ur: NEGATIVE mg/dL
Specific Gravity, Urine: 1.013 (ref 1.005–1.030)

## 2015-03-30 LAB — COMPREHENSIVE METABOLIC PANEL
ALT: 34 U/L (ref 14–54)
AST: 44 U/L — AB (ref 15–41)
Albumin: 3.9 g/dL (ref 3.5–5.0)
Alkaline Phosphatase: 45 U/L (ref 38–126)
Anion gap: 11 (ref 5–15)
BUN: 12 mg/dL (ref 6–20)
CHLORIDE: 98 mmol/L — AB (ref 101–111)
CO2: 25 mmol/L (ref 22–32)
CREATININE: 1.16 mg/dL — AB (ref 0.44–1.00)
Calcium: 8.9 mg/dL (ref 8.9–10.3)
GFR calc non Af Amer: 46 mL/min — ABNORMAL LOW (ref >60)
GFR, EST AFRICAN AMERICAN: 54 mL/min — AB (ref >60)
Glucose, Bld: 121 mg/dL — ABNORMAL HIGH (ref 65–99)
Potassium: 3.8 mmol/L (ref 3.5–5.1)
SODIUM: 134 mmol/L — AB (ref 135–145)
Total Bilirubin: 0.9 mg/dL (ref 0.3–1.2)
Total Protein: 7.2 g/dL (ref 6.5–8.1)

## 2015-03-30 LAB — CBC
HEMATOCRIT: 35.7 % (ref 35.0–47.0)
Hemoglobin: 12.1 g/dL (ref 12.0–16.0)
MCH: 31.3 pg (ref 26.0–34.0)
MCHC: 33.8 g/dL (ref 32.0–36.0)
MCV: 92.8 fL (ref 80.0–100.0)
PLATELETS: 256 10*3/uL (ref 150–440)
RBC: 3.85 MIL/uL (ref 3.80–5.20)
RDW: 13.1 % (ref 11.5–14.5)
WBC: 13.3 10*3/uL — ABNORMAL HIGH (ref 3.6–11.0)

## 2015-03-30 LAB — LIPASE, BLOOD: LIPASE: 50 U/L (ref 22–51)

## 2015-03-30 NOTE — Telephone Encounter (Signed)
Pt saw Dr. Lacinda Axon last [place] test her negative, but she is still c/o diarrhea. She states that she mentioned the to Dr. Lacinda Axon CMA & never heard back from them. Please advise.

## 2015-03-30 NOTE — Telephone Encounter (Signed)
Patient has requested a call back, dealing with her diarrhea.

## 2015-03-30 NOTE — ED Notes (Signed)
Patient reports having diarrhea "for a while". States she had gallbladder removed in December, has had intermittent diarrhea since then, but has had increased diarrhea lately. Also reports having RA flare with pain to hands, neck, back of head, and arms. Patient states she has had a cough with rib pain that started today.

## 2015-03-30 NOTE — Telephone Encounter (Signed)
Per previous phone message, pt was not called with appt time.  Please call this pt and let her know that I can see her on Thursday 04/02/15 at 4:00.  Block 30 min spot.

## 2015-03-30 NOTE — Telephone Encounter (Signed)
I do not have anything earlier right now, but can see if anyone cancels.  If acute and worsening symptoms (previous note had made mention that symptoms were going on for months), I recommend acute care and also letting Dr [name] know if concern regarding rheumatoid arthritis flare.  Let me know if a problem.

## 2015-03-30 NOTE — Telephone Encounter (Signed)
Pt states she cant wait that long, please advise

## 2015-03-31 ENCOUNTER — Other Ambulatory Visit: Payer: Self-pay | Admitting: Internal Medicine

## 2015-03-31 ENCOUNTER — Telehealth: Payer: Self-pay | Admitting: Internal Medicine

## 2015-03-31 NOTE — Telephone Encounter (Signed)
Pt states she has been having trouble with her bowels going several time a day. Pt went to the ER and she states she left. No appt avail slot to sch. I offered to see another provider Pt declined wanting to see another provider. Pt wants to see Dr Nicki Reaper only. Please let me know where to sch appt once the provider gives time. Thank You!

## 2015-03-31 NOTE — Telephone Encounter (Signed)
Pt was seen in the ER yesterday per epic. (tried to reach her yesterday afternoon-no answer). Dr. Nicki Reaper has left her a message also.

## 2015-03-31 NOTE — Telephone Encounter (Signed)
See if she can come in tomorrow at 12:30.  Work in appt.

## 2015-04-01 ENCOUNTER — Ambulatory Visit (INDEPENDENT_AMBULATORY_CARE_PROVIDER_SITE_OTHER): Payer: Medicare Other | Admitting: Internal Medicine

## 2015-04-01 ENCOUNTER — Encounter: Payer: Self-pay | Admitting: Internal Medicine

## 2015-04-01 VITALS — BP 136/70 | HR 65 | Temp 97.9°F | Resp 17 | Ht 64.5 in | Wt 169.0 lb

## 2015-04-01 DIAGNOSIS — R7989 Other specified abnormal findings of blood chemistry: Secondary | ICD-10-CM | POA: Diagnosis not present

## 2015-04-01 DIAGNOSIS — I10 Essential (primary) hypertension: Secondary | ICD-10-CM

## 2015-04-01 DIAGNOSIS — M069 Rheumatoid arthritis, unspecified: Secondary | ICD-10-CM

## 2015-04-01 DIAGNOSIS — R197 Diarrhea, unspecified: Secondary | ICD-10-CM | POA: Diagnosis not present

## 2015-04-01 DIAGNOSIS — R945 Abnormal results of liver function studies: Secondary | ICD-10-CM

## 2015-04-01 DIAGNOSIS — D649 Anemia, unspecified: Secondary | ICD-10-CM

## 2015-04-01 LAB — BASIC METABOLIC PANEL
BUN: 21 mg/dL (ref 6–23)
CHLORIDE: 101 meq/L (ref 96–112)
CO2: 32 mEq/L (ref 19–32)
Calcium: 9.7 mg/dL (ref 8.4–10.5)
Creatinine, Ser: 1.16 mg/dL (ref 0.40–1.20)
GFR: 48.82 mL/min — AB (ref >60.00)
GLUCOSE: 101 mg/dL — AB (ref 70–99)
Potassium: 4.3 mEq/L (ref 3.5–5.1)
Sodium: 139 mEq/L (ref 135–145)

## 2015-04-01 LAB — HEPATIC FUNCTION PANEL
ALK PHOS: 40 U/L (ref 39–117)
ALT: 30 U/L (ref 0–35)
AST: 27 U/L (ref 0–37)
Albumin: 3.7 g/dL (ref 3.5–5.2)
BILIRUBIN DIRECT: 0.1 mg/dL (ref 0.0–0.3)
TOTAL PROTEIN: 7.1 g/dL (ref 6.0–8.3)
Total Bilirubin: 0.4 mg/dL (ref 0.2–1.2)

## 2015-04-01 NOTE — Progress Notes (Signed)
Patient ID: Kaitlyn Huff, female   DOB: 1941-06-16, 74 y.o.   MRN: [id number]   Subjective:    Patient ID: Kaitlyn Huff, female    DOB: 03-16-1941, 73 y.o.   MRN: [id number]  HPI  Patient here as a work in with concerns regarding persistent diarrhea.  States has had some issues with her bowels since her gallbladder was removed.  Reports over the last month, worsening problems.  States will have 5-6 loose stool in the am and several at lunch and several in the pm.  Loose and soft stool.  Various colors.  Sometimes green, sometimes yellow and sometimes brown.  States her bottom is irritated.  Some minimal blood streaks on the tissue when wipes - where irritated.  No blood in the stool.  No vomiting.  Is eating.  No increased abdominal pain or cramping.  Recent rheumatoid flare.  On prednisone taper now.  No one else in family sick.     Past Medical History  Diagnosis Date  . Rheumatoid arthritis(714.0)     MTX transaminitis, Leflunomide (rash), enbrel, plaquinil, prednisone, remicade, Imuran (transaminitis)  . Hyperlipidemia   . Diverticulitis   . Osteoarthritis   . GERD (gastroesophageal reflux disease)   . Hypertension   . Anemia   . Positive PPD     s/p INH (2006)  . Osteoporosis     actonel  . 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 Abdominal hysterectomy    . Tracheostomy  1959  . Tubal ligation    . Cervical cone biopsy    . Ovary surgery    . Cholecystectomy  06/22/14   Family History  Problem Relation Age of Onset  . Heart disease Father     MI  . Heart disease Mother   . Valvular heart disease Mother   . Colon cancer Neg Hx   . Breast cancer Sister 84   Social History   Social History  . Marital Status: Widowed    Spouse Name: N/A  . Number of Children: 4  . Years of Education: N/A   Social History Main Topics  . Smoking status: Never Smoker   . Smokeless tobacco: Never Used  . Alcohol Use:  No  . Drug Use: No  . Sexual Activity: Not Asked   Other Topics Concern  . None   Social History Narrative    Outpatient Encounter Prescriptions as of 04/01/2015  Medication Sig  . alendronate (FOSAMAX) 70 MG tablet Take 70 mg by mouth once a week. Take with a full glass of water on an empty stomach.  . ALPRAZolam (XANAX) 0.25 MG tablet 1 tablet q day prn  . aspirin 81 MG tablet Take 81 mg by mouth daily.  . calcium-vitamin D (OSCAL 500/200 D-3) 500-200 MG-UNIT per tablet Take 1 tablet by mouth 2 (two) times daily.  . cetirizine (ZYRTEC) 10 MG tablet TAKE 1 TABLET DAILY  . Diphenhyd-Hydrocort-Nystatin (FIRST-DUKES MOUTHWASH) SUSP 5-10 cc's swish and spit tid  . DPH-Lido-AlHydr-MgHydr-Simeth (FIRST-MOUTHWASH BLM) SUSP take 5 to 10 milliliters by mouth SWISH AND SPIT three times a day  . FeFum-FePo-FA-B Cmp-C-Zn-Mn-Cu (TANDEM PLUS) 162-115.2-1 MG CAPS TAKE 1 CAPSULE DAILY  . Fluticasone-Salmeterol (ADVAIR DISKUS) 250-50 MCG/DOSE AEPB INHALE 1 PUFF INTO THE LUNGS EVERY 12 (TWELVE) HOURS  . furosemide (LASIX) 20 MG tablet TAKE 1 TABLET DAILY  . gabapentin (NEURONTIN) 300 MG capsule Take 300 mg by mouth  4 (four) times daily.  . hydroxychloroquine (PLAQUENIL) 200 MG tablet Take 1 tablet (200 mg total) by mouth 2 (two) times daily.  . InFLIXimab (REMICADE IV) Inject every 6 weeks  . metoprolol succinate (TOPROL-XL) 50 MG 24 hr tablet TAKE 1 TABLET (50 MG TOTAL) DAILY. TAKE WITH OR IMMEDIATELY FOLLOWING A MEAL  . Multiple Vitamin (MULTIVITAMIN) capsule Take 1 capsule by mouth daily.  Marland Kitchen NEXIUM 40 MG capsule TAKE 1 CAPSULE (40 MG TOTAL) TWICE A DAY BEFORE A MEAL  . potassium chloride (K-DUR) 10 MEQ tablet Take 10 mEq by mouth once.   . potassium chloride (MICRO-K) 10 MEQ CR capsule TAKE 2 CAPSULES TWICE A DAY  . predniSONE (DELTASONE) 5 MG tablet Take 5 mg by mouth daily.  . simvastatin (ZOCOR) 20 MG tablet TAKE 1 TABLET DAILY  . sodium chloride (OCEAN) 0.65 % nasal spray Place 1 spray into the  nose as needed for congestion.  . traMADol (ULTRAM) 50 MG tablet Take 1 tablet (50 mg total) by mouth at bedtime as needed.   No facility-administered encounter medications on file as of 04/01/2015.    Review of Systems  Constitutional: Negative for fever, appetite change and unexpected weight change.  HENT: Negative for congestion and sinus pressure.   Respiratory: Negative for cough, chest tightness and shortness of breath.   Cardiovascular: Negative for chest pain, palpitations and leg swelling.  Gastrointestinal: Positive for diarrhea. Negative for nausea, vomiting and abdominal pain.  Genitourinary: Negative for dysuria and difficulty urinating.  Musculoskeletal:       Recent rheumatoid flare.  On prednisone.  Joint stiffness better.    Skin: Negative for color change and rash.  Neurological: Negative for dizziness, light-headedness and headaches.  Psychiatric/Behavioral: Negative for dysphoric mood and agitation.       Objective:    Physical Exam  Constitutional: She appears well-developed and well-nourished. No distress.  HENT:  Nose: Nose normal.  Mouth/Throat: Oropharynx is clear and moist.  Eyes: Conjunctivae are normal. Right eye exhibits no discharge. Left eye exhibits no discharge.  Neck: Neck supple. No thyromegaly present.  Cardiovascular: Normal rate and regular rhythm.   Pulmonary/Chest: Breath sounds normal. No respiratory distress. She has no wheezes.  Abdominal: Soft. Bowel sounds are normal. There is no tenderness.  Musculoskeletal: She exhibits no edema or tenderness.  Lymphadenopathy:    She has no cervical adenopathy.  Skin: No rash noted. No erythema.  Psychiatric: She has a normal mood and affect. Her behavior is normal.    BP 136/70 mmHg  Pulse 65  Temp(Src) 97.9 F (36.6 C) (Oral)  Resp 17  Ht 5' 4.5" (1.638 m)  Wt 169 lb (76.658 kg)  BMI 28.57 kg/m2  SpO2 96% Wt Readings from Last 3 Encounters:  04/01/15 169 lb (76.658 kg)  03/30/15 170  lb (77.111 kg)  03/18/15 170 lb 2 oz (77.168 kg)     Lab Results  Component Value Date   WBC 13.3* 03/30/2015   HGB 12.1 03/30/2015   HCT 35.7 03/30/2015   PLT 256 03/30/2015   GLUCOSE 101* 04/01/2015   CHOL 133 07/28/2014   TRIG 130.0 07/28/2014   HDL 56.20 07/28/2014   LDLDIRECT 73.7 08/20/2013   LDLCALC 51 07/28/2014   ALT 30 04/01/2015   AST 27 04/01/2015   NA 139 04/01/2015   K 4.3 04/01/2015   CL 101 04/01/2015   CREATININE 1.16 04/01/2015   BUN 21 04/01/2015   CO2 32 04/01/2015   TSH 4.13 01/24/2014   INR  1.0 11/01/2014       Assessment & Plan:   Problem List Items Addressed This Visit    Abnormal liver function test    Abdominal ultrasound revealed fatty liver.  Saw GI.  Follow liver panel.        Anemia    Follow cbc and ferritin.        Diarrhea - Primary    Persistent loose soft stool.  Worsened over the last month.  Will check stool studies.  Continue probiotic.  Have GI reevaluate.  Weight stable from recent checks.        Relevant Orders   Clostridium difficile EIA   Fecal leukocytes   Stool culture   Ova and parasite examination   Hepatic function panel (Completed)   Basic metabolic panel (Completed)   Ambulatory referral to Gastroenterology   Hypertension    Blood pressure doing well.  Same medication regimen.        Rheumatoid arthritis    Recent flare.  On prednisone.  Follow.            [name], MD

## 2015-04-01 NOTE — Progress Notes (Signed)
Pre-visit discussion using our clinic review tool. No additional management support is needed unless otherwise documented below in the visit note.  

## 2015-04-02 ENCOUNTER — Encounter: Payer: Self-pay | Admitting: *Deleted

## 2015-04-03 ENCOUNTER — Other Ambulatory Visit: Payer: Self-pay | Admitting: Internal Medicine

## 2015-04-03 DIAGNOSIS — R197 Diarrhea, unspecified: Secondary | ICD-10-CM | POA: Diagnosis not present

## 2015-04-04 LAB — C. DIFFICILE GDH AND TOXIN A/B
C. DIFFICILE GDH: NOT DETECTED
C. difficile Toxin A/B: NOT DETECTED

## 2015-04-05 LAB — FECAL LACTOFERRIN, QUANT: LACTOFERRIN: POSITIVE

## 2015-04-06 ENCOUNTER — Encounter: Payer: Self-pay | Admitting: Internal Medicine

## 2015-04-06 ENCOUNTER — Telehealth: Payer: Self-pay | Admitting: Internal Medicine

## 2015-04-06 NOTE — Assessment & Plan Note (Addendum)
Persistent loose soft stool.  Worsened over the last month.  Will check stool studies.  Continue probiotic.  Have GI reevaluate.  Weight stable from recent checks.

## 2015-04-06 NOTE — Assessment & Plan Note (Signed)
Blood pressure doing well.  Same medication regimen.   

## 2015-04-06 NOTE — Assessment & Plan Note (Signed)
Recent flare.  On prednisone.  Follow.

## 2015-04-06 NOTE — Assessment & Plan Note (Signed)
Follow cbc and ferritin.  

## 2015-04-06 NOTE — Assessment & Plan Note (Signed)
Abdominal ultrasound revealed fatty liver.  Saw GI.  Follow liver panel.

## 2015-04-07 LAB — OVA AND PARASITE EXAMINATION: OP: NONE SEEN

## 2015-04-07 LAB — STOOL CULTURE

## 2015-04-09 DIAGNOSIS — K529 Noninfective gastroenteritis and colitis, unspecified: Secondary | ICD-10-CM | POA: Diagnosis not present

## 2015-04-09 DIAGNOSIS — K219 Gastro-esophageal reflux disease without esophagitis: Secondary | ICD-10-CM | POA: Diagnosis not present

## 2015-04-09 DIAGNOSIS — R112 Nausea with vomiting, unspecified: Secondary | ICD-10-CM | POA: Diagnosis not present

## 2015-04-13 ENCOUNTER — Other Ambulatory Visit (INDEPENDENT_AMBULATORY_CARE_PROVIDER_SITE_OTHER): Payer: Medicare Other

## 2015-04-13 DIAGNOSIS — R7989 Other specified abnormal findings of blood chemistry: Secondary | ICD-10-CM

## 2015-04-13 DIAGNOSIS — E7800 Pure hypercholesterolemia, unspecified: Secondary | ICD-10-CM

## 2015-04-13 DIAGNOSIS — M069 Rheumatoid arthritis, unspecified: Secondary | ICD-10-CM

## 2015-04-13 DIAGNOSIS — I10 Essential (primary) hypertension: Secondary | ICD-10-CM | POA: Diagnosis not present

## 2015-04-13 DIAGNOSIS — R945 Abnormal results of liver function studies: Secondary | ICD-10-CM

## 2015-04-13 LAB — BASIC METABOLIC PANEL
BUN: 13 mg/dL (ref 6–23)
CHLORIDE: 103 meq/L (ref 96–112)
CO2: 30 meq/L (ref 19–32)
CREATININE: 1.06 mg/dL (ref 0.40–1.20)
Calcium: 9.4 mg/dL (ref 8.4–10.5)
GFR: 54.17 mL/min — ABNORMAL LOW (ref >60.00)
GLUCOSE: 86 mg/dL (ref 70–99)
Potassium: 3.9 mEq/L (ref 3.5–5.1)
Sodium: 141 mEq/L (ref 135–145)

## 2015-04-13 LAB — LIPID PANEL
Cholesterol: 128 mg/dL (ref 0–200)
HDL: 58.3 mg/dL
LDL Cholesterol: 46 mg/dL (ref 0–99)
NonHDL: 69.9
Total CHOL/HDL Ratio: 2
Triglycerides: 119 mg/dL (ref 0.0–149.0)
VLDL: 23.8 mg/dL (ref 0.0–40.0)

## 2015-04-13 LAB — CBC WITH DIFFERENTIAL/PLATELET
Basophils Absolute: 0 10*3/uL (ref 0.0–0.1)
Basophils Relative: 0.4 % (ref 0.0–3.0)
EOS PCT: 2.6 % (ref 0.0–5.0)
Eosinophils Absolute: 0.2 10*3/uL (ref 0.0–0.7)
HCT: 34.6 % — ABNORMAL LOW (ref 36.0–46.0)
Hemoglobin: 11.3 g/dL — ABNORMAL LOW (ref 12.0–15.0)
LYMPHS ABS: 2.2 10*3/uL (ref 0.7–4.0)
Lymphocytes Relative: 23 % (ref 12.0–46.0)
MCHC: 32.7 g/dL (ref 30.0–36.0)
MCV: 94.4 fl (ref 78.0–100.0)
Monocytes Absolute: 0.7 10*3/uL (ref 0.1–1.0)
Monocytes Relative: 7.6 % (ref 3.0–12.0)
NEUTROS PCT: 66.4 % (ref 43.0–77.0)
Neutro Abs: 6.4 10*3/uL (ref 1.4–7.7)
Platelets: 235 10*3/uL (ref 150.0–400.0)
RBC: 3.67 Mil/uL — AB (ref 3.87–5.11)
RDW: 13.2 % (ref 11.5–15.5)
WBC: 9.7 10*3/uL (ref 4.0–10.5)

## 2015-04-13 LAB — TSH: TSH: 2.72 u[IU]/mL (ref 0.35–4.50)

## 2015-04-13 LAB — HEPATIC FUNCTION PANEL
ALBUMIN: 3.6 g/dL (ref 3.5–5.2)
ALK PHOS: 47 U/L (ref 39–117)
ALT: 24 U/L (ref 0–35)
AST: 24 U/L (ref 0–37)
BILIRUBIN TOTAL: 0.5 mg/dL (ref 0.2–1.2)
Bilirubin, Direct: 0.1 mg/dL (ref 0.0–0.3)
Total Protein: 7 g/dL (ref 6.0–8.3)

## 2015-04-14 ENCOUNTER — Encounter: Payer: Self-pay | Admitting: Internal Medicine

## 2015-04-14 ENCOUNTER — Ambulatory Visit (INDEPENDENT_AMBULATORY_CARE_PROVIDER_SITE_OTHER): Payer: Medicare Other | Admitting: Internal Medicine

## 2015-04-14 VITALS — BP 110/60 | HR 66 | Temp 97.9°F | Resp 18 | Ht 64.0 in | Wt 168.5 lb

## 2015-04-14 DIAGNOSIS — K219 Gastro-esophageal reflux disease without esophagitis: Secondary | ICD-10-CM

## 2015-04-14 DIAGNOSIS — G4733 Obstructive sleep apnea (adult) (pediatric): Secondary | ICD-10-CM

## 2015-04-14 DIAGNOSIS — K838 Other specified diseases of biliary tract: Secondary | ICD-10-CM

## 2015-04-14 DIAGNOSIS — Z658 Other specified problems related to psychosocial circumstances: Secondary | ICD-10-CM

## 2015-04-14 DIAGNOSIS — I10 Essential (primary) hypertension: Secondary | ICD-10-CM | POA: Diagnosis not present

## 2015-04-14 DIAGNOSIS — R634 Abnormal weight loss: Secondary | ICD-10-CM

## 2015-04-14 DIAGNOSIS — E7800 Pure hypercholesterolemia, unspecified: Secondary | ICD-10-CM

## 2015-04-14 DIAGNOSIS — M069 Rheumatoid arthritis, unspecified: Secondary | ICD-10-CM | POA: Diagnosis not present

## 2015-04-14 DIAGNOSIS — R197 Diarrhea, unspecified: Secondary | ICD-10-CM

## 2015-04-14 DIAGNOSIS — Z0000 Encounter for general adult medical examination without abnormal findings: Secondary | ICD-10-CM

## 2015-04-14 DIAGNOSIS — R945 Abnormal results of liver function studies: Secondary | ICD-10-CM

## 2015-04-14 DIAGNOSIS — F439 Reaction to severe stress, unspecified: Secondary | ICD-10-CM

## 2015-04-14 DIAGNOSIS — D649 Anemia, unspecified: Secondary | ICD-10-CM | POA: Diagnosis not present

## 2015-04-14 DIAGNOSIS — M0579 Rheumatoid arthritis with rheumatoid factor of multiple sites without organ or systems involvement: Secondary | ICD-10-CM | POA: Diagnosis not present

## 2015-04-14 DIAGNOSIS — R7989 Other specified abnormal findings of blood chemistry: Secondary | ICD-10-CM

## 2015-04-14 DIAGNOSIS — M79642 Pain in left hand: Secondary | ICD-10-CM | POA: Diagnosis not present

## 2015-04-14 LAB — FERRITIN: FERRITIN: 182.6 ng/mL (ref 10.0–291.0)

## 2015-04-14 LAB — IBC PANEL
IRON: 69 ug/dL (ref 42–145)
Saturation Ratios: 23 % (ref 20.0–50.0)
Transferrin: 214 mg/dL (ref 212.0–360.0)

## 2015-04-14 NOTE — Progress Notes (Signed)
Patient ID: Kaitlyn Huff, female   DOB: 01-04-1941, 74 y.o.   MRN: [id number]   Subjective:    Patient ID: Kaitlyn Huff, female    DOB: Sep 29, 1940, 74 y.o.   MRN: [id number]  HPI  Patient with past history of hypertension, OSA, GERD, RA and hypercholesterolemia.  She comes in today to follow up on these issues as well as for a complete physical exam.  She has been having problems with increased diarrhea.  See previous note for details.  She saw GI.  Started on colestid.  Did not tolerate.  Just started yesterday.  Had emesis last pm.  States was instructed to take two bid.  She is eating.  No nausea or vomiting.  No chest pain or tightness.  No sob.  No increased cough or congestion.  No abdominal pain.  No blood in the stool.  She is planning to have a colonoscopy/EGD 05/11/15.  Reports some fatigue.     Past Medical History  Diagnosis Date  . Rheumatoid arthritis(714.0)     MTX transaminitis, Leflunomide (rash), enbrel, plaquinil, prednisone, remicade, Imuran (transaminitis)  . Hyperlipidemia   . Diverticulitis   . Osteoarthritis   . GERD (gastroesophageal reflux disease)   . Hypertension   . Anemia   . Positive PPD     s/p INH (2006)  . Osteoporosis     actonel  . 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 Abdominal hysterectomy    . Tracheostomy  1959  . Tubal ligation    . Cervical cone biopsy    . Ovary surgery    . Cholecystectomy  06/22/14   Family History  Problem Relation Age of Onset  . Heart disease Father     MI  . Heart disease Mother   . Valvular heart disease Mother   . Colon cancer Neg Hx   . Breast cancer Sister 69   Social History   Social History  . Marital Status: Widowed    Spouse Name: N/A  . Number of Children: 4  . Years of Education: N/A   Social History Main Topics  . Smoking status: Never Smoker   . Smokeless tobacco: Never Used  . Alcohol Use: No  . Drug Use: No  .  Sexual Activity: Not Asked   Other Topics Concern  . None   Social History Narrative    Outpatient Encounter Prescriptions as of 04/14/2015  Medication Sig  . alendronate (FOSAMAX) 70 MG tablet Take 70 mg by mouth once a week. Take with a full glass of water on an empty stomach.  . ALPRAZolam (XANAX) 0.25 MG tablet 1 tablet q day prn  . aspirin 81 MG tablet Take 81 mg by mouth daily.  . calcium-vitamin D (OSCAL 500/200 D-3) 500-200 MG-UNIT per tablet Take 1 tablet by mouth 2 (two) times daily.  . cetirizine (ZYRTEC) 10 MG tablet TAKE 1 TABLET DAILY  . colestipol (COLESTID) 1 G tablet Take by mouth.  . DPH-Lido-AlHydr-MgHydr-Simeth (FIRST-MOUTHWASH BLM) SUSP take 5 to 10 milliliters by mouth SWISH AND SPIT three times a day  . FeFum-FePo-FA-B Cmp-C-Zn-Mn-Cu (TANDEM PLUS) 162-115.2-1 MG CAPS TAKE 1 CAPSULE DAILY  . Fluticasone-Salmeterol (ADVAIR DISKUS) 250-50 MCG/DOSE AEPB INHALE 1 PUFF INTO THE LUNGS EVERY 12 (TWELVE) HOURS  . furosemide (LASIX) 20 MG tablet TAKE 1 TABLET DAILY  . gabapentin (NEURONTIN) 300 MG capsule Take 300 mg by mouth 4 (four) times  daily.  . hydroxychloroquine (PLAQUENIL) 200 MG tablet Take 1 tablet (200 mg total) by mouth 2 (two) times daily.  . InFLIXimab (REMICADE IV) Inject every 6 weeks  . metoprolol succinate (TOPROL-XL) 50 MG 24 hr tablet TAKE 1 TABLET (50 MG TOTAL) DAILY. TAKE WITH OR IMMEDIATELY FOLLOWING A MEAL  . Multiple Vitamin (MULTIVITAMIN) capsule Take 1 capsule by mouth daily.  Marland Kitchen NEXIUM 40 MG capsule TAKE 1 CAPSULE (40 MG TOTAL) TWICE A DAY BEFORE A MEAL  . potassium chloride (K-DUR) 10 MEQ tablet Take 10 mEq by mouth once.   . potassium chloride (MICRO-K) 10 MEQ CR capsule TAKE 2 CAPSULES TWICE A DAY  . predniSONE (DELTASONE) 5 MG tablet Take 5 mg by mouth daily.  . simvastatin (ZOCOR) 20 MG tablet TAKE 1 TABLET DAILY  . sodium chloride (OCEAN) 0.65 % nasal spray Place 1 spray into the nose as needed for congestion.  . traMADol (ULTRAM) 50 MG  tablet Take 1 tablet (50 mg total) by mouth at bedtime as needed.  . [DISCONTINUED] Diphenhyd-Hydrocort-Nystatin (FIRST-DUKES MOUTHWASH) SUSP 5-10 cc's swish and spit tid   No facility-administered encounter medications on file as of 04/14/2015.    Review of Systems  Constitutional: Negative for appetite change and unexpected weight change.  HENT: Negative for congestion and sinus pressure.   Eyes: Negative for pain and visual disturbance.  Respiratory: Negative for cough, chest tightness and shortness of breath.   Cardiovascular: Negative for chest pain, palpitations and leg swelling.  Gastrointestinal: Positive for diarrhea. Negative for nausea, vomiting and abdominal pain.  Genitourinary: Negative for dysuria, frequency and difficulty urinating.  Musculoskeletal: Negative for back pain.       Some increased stiffness with arthritis flare.  On prednisone intermittently.    Skin: Negative for color change and rash.  Neurological: Negative for dizziness, light-headedness and headaches.  Hematological: Negative for adenopathy. Does not bruise/bleed easily.  Psychiatric/Behavioral: Negative for dysphoric mood and agitation.       Objective:    Physical Exam  Constitutional: She is oriented to person, place, and time. She appears well-developed and well-nourished. No distress.  HENT:  Nose: Nose normal.  Mouth/Throat: Oropharynx is clear and moist.  Eyes: Right eye exhibits no discharge. Left eye exhibits no discharge. No scleral icterus.  Neck: Neck supple. No thyromegaly present.  Cardiovascular: Normal rate and regular rhythm.   Pulmonary/Chest: Breath sounds normal. No accessory muscle usage. No tachypnea. No respiratory distress. She has no decreased breath sounds. She has no wheezes. She has no rhonchi. Right breast exhibits no inverted nipple, no mass, no nipple discharge and no tenderness (no axillary adenopathy). Left breast exhibits no inverted nipple, no mass, no nipple  discharge and no tenderness (no axilarry adenopathy).  Abdominal: Soft. Bowel sounds are normal. There is no tenderness.  Musculoskeletal: She exhibits no edema or tenderness.  Lymphadenopathy:    She has no cervical adenopathy.  Neurological: She is alert and oriented to person, place, and time.  Skin: Skin is warm. No rash noted. No erythema.  Psychiatric: She has a normal mood and affect. Her behavior is normal.    BP 110/60 mmHg  Pulse 66  Temp(Src) 97.9 F (36.6 C) (Oral)  Resp 18  Ht 5\' 4"  (1.626 m)  Wt 168 lb 8 oz (76.431 kg)  BMI 28.91 kg/m2  SpO2 94% Wt Readings from Last 3 Encounters:  04/14/15 168 lb 8 oz (76.431 kg)  04/01/15 169 lb (76.658 kg)  03/30/15 170 lb (77.111 kg)  Lab Results  Component Value Date   WBC 9.7 04/13/2015   HGB 11.3* 04/13/2015   HCT 34.6* 04/13/2015   PLT 235.0 04/13/2015   GLUCOSE 86 04/13/2015   CHOL 128 04/13/2015   TRIG 119.0 04/13/2015   HDL 58.30 04/13/2015   LDLDIRECT 73.7 08/20/2013   LDLCALC 46 04/13/2015   ALT 24 04/13/2015   AST 24 04/13/2015   NA 141 04/13/2015   K 3.9 04/13/2015   CL 103 04/13/2015   CREATININE 1.06 04/13/2015   BUN 13 04/13/2015   CO2 30 04/13/2015   TSH 2.72 04/13/2015   INR 1.0 11/01/2014       Assessment & Plan:   Problem List Items Addressed This Visit    Abnormal liver function test    Abdominal ultrasound revealed fatty liver.  Saw GI.  Follow liver panel.  Recent check revealed normal liver function tests.       Anemia - Primary    Hgb 11.3.  Follow.  Check iron studies.        Relevant Orders   Ferritin (Completed)   IBC panel (Completed)   RBC Folate (Completed)   Diarrhea    Persistent loose stool as outlined.  Multiple episodes.  Saw GI.  Started on colestid.  Did not tolerated.  Has a call in to GI.  Planning for colonoscopy and EGD at the end of the month.  Weight stable.  Recent electrolytes wnl.       Dilated bile duct    Has seen GI.  They felt no further w/up  warranted.        GERD (gastroesophageal reflux disease)    Upper symptoms controlled currently.  Planning for EGD end of month.        Health care maintenance    Physical today 04/14/15.  Mammogram 01/14/15 - birads I.  Colonoscopy planned for 05/11/15.        Hypercholesterolemia    On simvastatin.  Low cholesterol diet and exercise.  Follow lipid panel and liver function tests.       Relevant Medications   colestipol (COLESTID) 1 G tablet   Hypertension    Blood pressure under good control.  Continue same medication regimen.  Follow pressures.  Follow metabolic panel.  Cr just checked and improved - 1.06.  Electrolytes wnl.  Follow.        Relevant Medications   colestipol (COLESTID) 1 G tablet   Obstructive sleep apnea    She has declined CPAP.        Rheumatoid arthritis ([place])    Followed by Dr [name].  Has been on prednisone taper.  Follow.        Stress    Feels handling stress.  Follow.       Weight loss    Weight relatively stable from the most recent checks.  Follow.            [name], MD

## 2015-04-14 NOTE — Progress Notes (Signed)
Pre-visit discussion using our clinic review tool. No additional management support is needed unless otherwise documented below in the visit note.  

## 2015-04-15 ENCOUNTER — Encounter: Payer: Self-pay | Admitting: *Deleted

## 2015-04-15 DIAGNOSIS — K529 Noninfective gastroenteritis and colitis, unspecified: Secondary | ICD-10-CM | POA: Diagnosis not present

## 2015-04-15 LAB — FOLATE RBC

## 2015-04-19 ENCOUNTER — Encounter: Payer: Self-pay | Admitting: Internal Medicine

## 2015-04-19 NOTE — Assessment & Plan Note (Signed)
Feels handling stress.  Follow.

## 2015-04-19 NOTE — Assessment & Plan Note (Signed)
Physical today 04/14/15.  Mammogram 01/14/15 - birads I.  Colonoscopy planned for 05/11/15.

## 2015-04-19 NOTE — Assessment & Plan Note (Addendum)
Blood pressure under good control.  Continue same medication regimen.  Follow pressures.  Follow metabolic panel.  Cr just checked and improved - 1.06.  Electrolytes wnl.  Follow.

## 2015-04-19 NOTE — Assessment & Plan Note (Signed)
Weight relatively stable from the most recent checks.  Follow.

## 2015-04-19 NOTE — Assessment & Plan Note (Signed)
Has seen GI.  They felt no further w/up warranted.

## 2015-04-19 NOTE — Assessment & Plan Note (Signed)
Followed by Dr [name].  Has been on prednisone taper.  Follow.

## 2015-04-19 NOTE — Assessment & Plan Note (Signed)
She has declined CPAP.

## 2015-04-19 NOTE — Assessment & Plan Note (Signed)
Persistent loose stool as outlined.  Multiple episodes.  Saw GI.  Started on colestid.  Did not tolerated.  Has a call in to GI.  Planning for colonoscopy and EGD at the end of the month.  Weight stable.  Recent electrolytes wnl.

## 2015-04-19 NOTE — Assessment & Plan Note (Signed)
Hgb 11.3.  Follow.  Check iron studies.

## 2015-04-19 NOTE — Assessment & Plan Note (Signed)
On simvastatin.  Low cholesterol diet and exercise.  Follow lipid panel and liver function tests.   

## 2015-04-19 NOTE — Assessment & Plan Note (Signed)
Abdominal ultrasound revealed fatty liver.  Saw GI.  Follow liver panel.  Recent check revealed normal liver function tests.

## 2015-04-19 NOTE — Assessment & Plan Note (Signed)
Upper symptoms controlled currently.  Planning for EGD end of month.

## 2015-04-23 DIAGNOSIS — Z961 Presence of intraocular lens: Secondary | ICD-10-CM | POA: Diagnosis not present

## 2015-05-03 DIAGNOSIS — M7989 Other specified soft tissue disorders: Secondary | ICD-10-CM | POA: Diagnosis not present

## 2015-05-03 DIAGNOSIS — S92325A Nondisplaced fracture of second metatarsal bone, left foot, initial encounter for closed fracture: Secondary | ICD-10-CM | POA: Diagnosis not present

## 2015-05-03 DIAGNOSIS — M79672 Pain in left foot: Secondary | ICD-10-CM | POA: Diagnosis not present

## 2015-05-05 DIAGNOSIS — M84375A Stress fracture, left foot, initial encounter for fracture: Secondary | ICD-10-CM | POA: Diagnosis not present

## 2015-05-11 DIAGNOSIS — M05731 Rheumatoid arthritis with rheumatoid factor of right wrist without organ or systems involvement: Secondary | ICD-10-CM | POA: Diagnosis not present

## 2015-05-11 DIAGNOSIS — M0579 Rheumatoid arthritis with rheumatoid factor of multiple sites without organ or systems involvement: Secondary | ICD-10-CM | POA: Diagnosis not present

## 2015-05-11 DIAGNOSIS — M05732 Rheumatoid arthritis with rheumatoid factor of left wrist without organ or systems involvement: Secondary | ICD-10-CM | POA: Diagnosis not present

## 2015-05-19 ENCOUNTER — Other Ambulatory Visit: Payer: Self-pay | Admitting: Internal Medicine

## 2015-05-28 ENCOUNTER — Other Ambulatory Visit: Payer: Self-pay | Admitting: Internal Medicine

## 2015-06-07 ENCOUNTER — Emergency Department: Payer: Medicare Other

## 2015-06-07 ENCOUNTER — Emergency Department
Admission: EM | Admit: 2015-06-07 | Discharge: 2015-06-07 | Disposition: A | Discharge status: Home / Self Care | Payer: Medicare Other | Attending: Emergency Medicine | Admitting: Emergency Medicine

## 2015-06-07 ENCOUNTER — Encounter: Payer: Self-pay | Admitting: Emergency Medicine

## 2015-06-07 ENCOUNTER — Other Ambulatory Visit: Payer: Self-pay

## 2015-06-07 DIAGNOSIS — Z7982 Long term (current) use of aspirin: Secondary | ICD-10-CM | POA: Insufficient documentation

## 2015-06-07 DIAGNOSIS — M542 Cervicalgia: Secondary | ICD-10-CM | POA: Diagnosis not present

## 2015-06-07 DIAGNOSIS — Z880 Allergy status to penicillin: Secondary | ICD-10-CM | POA: Insufficient documentation

## 2015-06-07 DIAGNOSIS — M62838 Other muscle spasm: Secondary | ICD-10-CM

## 2015-06-07 DIAGNOSIS — M549 Dorsalgia, unspecified: Secondary | ICD-10-CM | POA: Diagnosis not present

## 2015-06-07 DIAGNOSIS — Z79899 Other long term (current) drug therapy: Secondary | ICD-10-CM | POA: Insufficient documentation

## 2015-06-07 DIAGNOSIS — R51 Headache: Secondary | ICD-10-CM | POA: Diagnosis not present

## 2015-06-07 DIAGNOSIS — I10 Essential (primary) hypertension: Secondary | ICD-10-CM | POA: Insufficient documentation

## 2015-06-07 DIAGNOSIS — R519 Headache, unspecified: Secondary | ICD-10-CM

## 2015-06-07 LAB — CBC WITH DIFFERENTIAL/PLATELET
Basophils Absolute: 0 10*3/uL (ref 0–0.1)
Basophils Relative: 0 %
EOS PCT: 2 %
Eosinophils Absolute: 0.2 10*3/uL (ref 0–0.7)
HCT: 36 % (ref 35.0–47.0)
Hemoglobin: 11.8 g/dL — ABNORMAL LOW (ref 12.0–16.0)
LYMPHS ABS: 2 10*3/uL (ref 1.0–3.6)
LYMPHS PCT: 17 %
MCH: 30.1 pg (ref 26.0–34.0)
MCHC: 32.7 g/dL (ref 32.0–36.0)
MCV: 91.9 fL (ref 80.0–100.0)
MONO ABS: 1 10*3/uL — AB (ref 0.2–0.9)
Monocytes Relative: 8 %
Neutro Abs: 8.5 10*3/uL — ABNORMAL HIGH (ref 1.4–6.5)
Neutrophils Relative %: 73 %
PLATELETS: 198 10*3/uL (ref 150–440)
RBC: 3.92 MIL/uL (ref 3.80–5.20)
RDW: 14.3 % (ref 11.5–14.5)
WBC: 11.8 10*3/uL — ABNORMAL HIGH (ref 3.6–11.0)

## 2015-06-07 LAB — BASIC METABOLIC PANEL
Anion gap: 10 (ref 5–15)
BUN: 8 mg/dL (ref 6–20)
CHLORIDE: 103 mmol/L (ref 101–111)
CO2: 27 mmol/L (ref 22–32)
CREATININE: 0.91 mg/dL (ref 0.44–1.00)
Calcium: 9.2 mg/dL (ref 8.9–10.3)
GFR calc non Af Amer: >60 mL/min (ref >60)
Glucose, Bld: 81 mg/dL (ref 65–99)
Potassium: 3.9 mmol/L (ref 3.5–5.1)
Sodium: 140 mmol/L (ref 135–145)

## 2015-06-07 LAB — TROPONIN I
TROPONIN I: 0.06 ng/mL — AB (ref <0.031)
Troponin I: 0.05 ng/mL — ABNORMAL HIGH (ref <0.031)

## 2015-06-07 MED ORDER — PREDNISONE 20 MG PO TABS
50.0000 mg | ORAL_TABLET | Freq: Once | ORAL | Status: AC
  Administered 2015-06-07: 50 mg via ORAL
  Filled 2015-06-07: qty 2

## 2015-06-07 MED ORDER — FENTANYL CITRATE (PF) 100 MCG/2ML IJ SOLN
100.0000 ug | Freq: Once | INTRAMUSCULAR | Status: AC
  Administered 2015-06-07: 100 ug via INTRAVENOUS
  Filled 2015-06-07: qty 2

## 2015-06-07 MED ORDER — ONDANSETRON HCL 4 MG/2ML IJ SOLN
4.0000 mg | Freq: Once | INTRAMUSCULAR | Status: AC
  Administered 2015-06-07: 4 mg via INTRAVENOUS
  Filled 2015-06-07: qty 2

## 2015-06-07 MED ORDER — FENTANYL CITRATE (PF) 100 MCG/2ML IJ SOLN
75.0000 ug | Freq: Once | INTRAMUSCULAR | Status: AC
  Administered 2015-06-07: 75 ug via INTRAVENOUS
  Filled 2015-06-07: qty 2

## 2015-06-07 MED ORDER — PREDNISONE 10 MG PO TABS: ORAL_TABLET | ORAL | Status: DC

## 2015-06-07 MED ORDER — HYDROCODONE-ACETAMINOPHEN 5-325 MG PO TABS: 1.0000 | ORAL_TABLET | Freq: Three times a day (TID) | ORAL | Status: DC | PRN

## 2015-06-07 MED ORDER — DIAZEPAM 5 MG/ML IJ SOLN
2.5000 mg | Freq: Once | INTRAMUSCULAR | Status: AC
  Administered 2015-06-07: 2.5 mg via INTRAVENOUS
  Filled 2015-06-07: qty 2

## 2015-06-07 MED ORDER — PROMETHAZINE HCL 25 MG/ML IJ SOLN
INTRAMUSCULAR | Status: AC
Start: 2015-06-07 — End: 2015-06-07
  Administered 2015-06-07: 12.5 mg via INTRAVENOUS
  Filled 2015-06-07: qty 1

## 2015-06-07 MED ORDER — PROMETHAZINE HCL 25 MG/ML IJ SOLN
12.5000 mg | Freq: Once | INTRAMUSCULAR | Status: AC
  Administered 2015-06-07: 12.5 mg via INTRAVENOUS

## 2015-06-07 MED ORDER — SODIUM CHLORIDE 0.9 % IV BOLUS (SEPSIS)
500.0000 mL | Freq: Once | INTRAVENOUS | Status: AC
  Administered 2015-06-07: 500 mL via INTRAVENOUS

## 2015-06-07 NOTE — ED Notes (Signed)
Family at bedside. 

## 2015-06-07 NOTE — ED Notes (Addendum)
Pt c/o of nausea upon returning from CT. Pt did not vomit.

## 2015-06-07 NOTE — Discharge Instructions (Signed)
You were evaluated for headache and although no certain cause was found your exam and evaluation are reassuring.  I suspect muscle spasm/tension as the source of your base of the skull, headache.  We discussed, use heat and massage as well as Aleve as needed for headache/muscle spasm. Due to your history of RA flare similar in the past, we did discuss starting a prednisone taper. At the end of the taper, resume your daily 5 mg tablet.  Return to the emergency room for any new or worsening condition including any worsening headache, any confusion or altered mental status, weakness, numbness, slurred speech, any chest pain or trouble breathing, palpitations, nausea, vomiting, passing out, or any other symptoms concerning to you.   General Headache Without Cause A headache is pain or discomfort felt around the head or neck area. There are many causes and types of headaches. In some cases, the cause may not be found.  HOME CARE  Managing Pain  Take over-the-counter and prescription medicines only as told by your doctor.  Lie down in a dark, quiet room when you have a headache.  If directed, apply ice to the head and neck area:  Put ice in a plastic bag.  Place a towel between your skin and the bag.  Leave the ice on for 20 minutes, 2-3 times per day.  Use a heating pad or hot shower to apply heat to the head and neck area as told by your doctor.  Keep lights dim if bright lights bother you or make your headaches worse. Eating and Drinking  Eat meals on a regular schedule.  Lessen how much alcohol you drink.  Lessen how much caffeine you drink, or stop drinking caffeine. General Instructions  Keep all follow-up visits as told by your doctor. This is important.  Keep a journal to find out if certain things bring on headaches. For example, write down:  What you eat and drink.  How much sleep you get.  Any change to your diet or medicines.  Relax by getting a massage or doing  other relaxing activities.  Lessen stress.  Sit up straight. Do not tighten (tense) your muscles.  Do not use tobacco products. This includes cigarettes, chewing tobacco, or e-cigarettes. If you need help quitting, ask your doctor.  Exercise regularly as told by your doctor.  Get enough sleep. This often means 7-9 hours of sleep. GET HELP IF:  Your symptoms are not helped by medicine.  You have a headache that feels different than the other headaches.  You feel sick to your stomach (nauseous) or you throw up (vomit).  You have a fever. GET HELP RIGHT AWAY IF:   Your headache becomes really bad.  You keep throwing up.  You have a stiff neck.  You have trouble seeing.  You have trouble speaking.  You have pain in the eye or ear.  Your muscles are weak or you lose muscle control.  You lose your balance or have trouble walking.  You feel like you will pass out (faint) or you pass out.  You have confusion.   This information is not intended to replace advice given to you by your health care provider. Make sure you discuss any questions you have with your health care provider.   Document Released: 04/05/2008 Document Revised: 03/18/2015 Document Reviewed: 10/20/2014 Elsevier Interactive Patient Education [employer].

## 2015-06-07 NOTE — ED Provider Notes (Signed)
[facility] Emergency Department Provider Note   ____________________________________________  Time seen:  I have reviewed the triage vital signs and the triage nursing note.  HISTORY  Chief Complaint Headache   Historian Patient and daughters  HPI Kaitlyn Huff is a 74 y.o. female who is here for evaluation of headache. Headache is located posterior occipital and is painful at the neck with motion at the left base of the skull. Patient is at the bases bilaterally but little bit more so on the left side where she has tenderness over the muscle. She states she woke up like this. The headache started this morning. No trauma. No fever. No nausea or vomiting. Patient states that she has had rheumatoid arthritis flare it has caused headache in the past she thinks this is a little bit worse. She has had a spontaneous foot fracture. Pain is moderate to severe. No alleviating factors.    Past Medical History  Diagnosis Date  . Rheumatoid arthritis(714.0)     MTX transaminitis, Leflunomide (rash), enbrel, plaquinil, prednisone, remicade, Imuran (transaminitis)  . Hyperlipidemia   . Diverticulitis   . Osteoarthritis   . GERD (gastroesophageal reflux disease)   . Hypertension   . Anemia   . Positive PPD     s/p INH (2006)  . Osteoporosis     actonel  . 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 Swelling, cheek 01/11/2015  . Health care maintenance 01/11/2015  . Pneumonia 11/09/2014  . Right hip pain 09/02/2014  . Diarrhea 09/02/2014  . Hospital discharge follow-up 09/02/2014  . Itching 08/04/2013  . Stress 08/04/2013  . Weight loss 08/04/2013  . Abnormal liver function test 03/26/2013  . Obstructive sleep apnea 12/23/2012  . Dilated bile duct 12/23/2012  . Hypercholesterolemia 08/12/2012  . Rheumatoid arthritis ([place]) 08/12/2012  . GERD (gastroesophageal reflux disease) 08/12/2012  .  Hypertension 08/12/2012  . Anemia 08/12/2012    Past Surgical History  Procedure Laterality Date  . Abdominal hysterectomy    . Tracheostomy  1959  . Tubal ligation    . Cervical cone biopsy    . Ovary surgery    . Cholecystectomy  06/22/14    Current Outpatient Rx  Name  Route  Sig  Dispense  Refill  . ADVAIR DISKUS 250-50 MCG/DOSE AEPB      USE 1 INHALATION INTO THE LUNGS EVERY 12 HOURS   180 each   0   . alendronate (FOSAMAX) 70 MG tablet   Oral   Take 70 mg by mouth once a week. Take with a full glass of water on an empty stomach.         . ALPRAZolam (XANAX) 0.25 MG tablet      1 tablet q day prn   90 tablet   0   . aspirin 81 MG tablet   Oral   Take 81 mg by mouth daily.         . calcium-vitamin D (OSCAL 500/200 D-3) 500-200 MG-UNIT per tablet   Oral   Take 1 tablet by mouth 2 (two) times daily.         . cetirizine (ZYRTEC) 10 MG tablet      TAKE 1 TABLET DAILY   90 tablet   3   . colestipol (COLESTID) 1 G tablet   Oral   Take by mouth.         Marland Kitchen  DPH-Lido-AlHydr-MgHydr-Simeth (FIRST-MOUTHWASH BLM) SUSP      take 5 to 10 milliliters by mouth SWISH AND SPIT three times a day   237 mL   0   . FeFum-FePo-FA-B Cmp-C-Zn-Mn-Cu (TANDEM PLUS) 162-115.2-1 MG CAPS      TAKE 1 CAPSULE DAILY   90 each   3   . furosemide (LASIX) 20 MG tablet      TAKE 1 TABLET DAILY   90 tablet   1   . gabapentin (NEURONTIN) 300 MG capsule   Oral   Take 300 mg by mouth 4 (four) times daily.         . hydroxychloroquine (PLAQUENIL) 200 MG tablet   Oral   Take 1 tablet (200 mg total) by mouth 2 (two) times daily.   180 tablet   1   . InFLIXimab (REMICADE IV)      Inject every 6 weeks         . metoprolol succinate (TOPROL-XL) 50 MG 24 hr tablet      TAKE 1 TABLET (50 MG TOTAL) DAILY. TAKE WITH OR IMMEDIATELY FOLLOWING A MEAL   90 tablet   2   . Multiple Vitamin (MULTIVITAMIN) capsule   Oral   Take 1 capsule by mouth daily.         Marland Kitchen  NEXIUM 40 MG capsule      TAKE 1 CAPSULE (40 MG TOTAL) TWICE A DAY BEFORE A MEAL   180 capsule   3   . potassium chloride (K-DUR) 10 MEQ tablet   Oral   Take 10 mEq by mouth once.          . potassium chloride (MICRO-K) 10 MEQ CR capsule      TAKE 2 CAPSULES TWICE A DAY   360 capsule   0   . predniSONE (DELTASONE) 5 MG tablet   Oral   Take 5 mg by mouth daily.      0   . simvastatin (ZOCOR) 20 MG tablet      TAKE 1 TABLET DAILY   90 tablet   0   . sodium chloride (OCEAN) 0.65 % nasal spray   Nasal   Place 1 spray into the nose as needed for congestion.         . traMADol (ULTRAM) 50 MG tablet   Oral   Take 1 tablet (50 mg total) by mouth at bedtime as needed.   30 tablet   0     Allergies Astelin; Ciprofloxacin; Codeine; Dilaudid; Flexeril; Imuran; Keflex; Lisinopril; Lyrica; Methotrexate derivatives; Amoxicillin; Arava; Clindamycin/lincomycin; Doxycycline; Lodine; Percocet; and Sulfa antibiotics  Family History  Problem Relation Age of Onset  . Heart disease Father     MI  . Heart disease Mother   . Valvular heart disease Mother   . Colon cancer Neg Hx   . Breast cancer Sister 61    Social History Social History  Substance Use Topics  . Smoking status: Never Smoker   . Smokeless tobacco: Never Used  . Alcohol Use: No    Review of Systems  Constitutional: Negative for fever. Eyes: Negative for visual changes. ENT: Negative for sore throat. Cardiovascular: Negative for chest pain. Respiratory: Negative for shortness of breath. Gastrointestinal: Negative for abdominal pain, vomiting and diarrhea. Genitourinary: Negative for dysuria. Musculoskeletal: Negative for back pain. Skin: Negative for rash. Neurological: Positive for headache. No temporal pain 10 point Review of Systems otherwise negative ____________________________________________   PHYSICAL EXAM:  VITAL SIGNS: ED Triage Vitals  Enc  Vitals Group     BP 06/07/15 1200 182/82  mmHg     Pulse Rate 06/07/15 1200 71     Resp 06/07/15 1200 18     Temp 06/07/15 1200 97.5 F (36.4 C)     Temp Source 06/07/15 1200 Oral     SpO2 06/07/15 1200 100 %     Weight 06/07/15 1200 165 lb (74.844 kg)     Height 06/07/15 1200 5\' 4"  (1.626 m)     Head Cir --      Peak Flow --      Pain Score 06/07/15 1201 7     Pain Loc --      Pain Edu? --      Excl. in [place]? --      Constitutional: Alert and oriented. Well appearing but squinting due to pain at the head Eyes: Conjunctivae are normal. PERRL. Normal extraocular movements. ENT   Head: Normocephalic and atraumatic.   Nose: No congestion/rhinnorhea.   Mouth/Throat: Mucous membranes are moist.   Neck: No stridor.  No midline C-spine tenderness. Moderate tenderness to palpation of extremely tight trapezius muscle on the left at the base of the neck and up to the insertion at the school. Cardiovascular/Chest: Normal rate, regular rhythm.  No murmurs, rubs, or gallops. Respiratory: Normal respiratory effort without tachypnea nor retractions. Breath sounds are clear and equal bilaterally. No wheezes/rales/rhonchi. Gastrointestinal: Soft. No distention, no guarding, no rebound. Nontender   Genitourinary/rectal:Deferred Musculoskeletal: Nontender with normal range of motion in all extremities. No joint effusions.  No lower extremity tenderness.  No edema. Neurologic:  Normal speech and language. No gross or focal neurologic deficits are appreciated. Skin:  Skin is warm, dry and intact. No rash noted. Psychiatric: Mood and affect are normal. Speech and behavior are normal. Patient exhibits appropriate insight and judgment.  ____________________________________________   EKG I, Lisa Roca, MD, the attending physician have personally viewed and interpreted all ECGs.  70 bpm. Normal sinus rhythm. Narrow QRS. Normal axis. Normal ST and T-wave. ____________________________________________  LABS (pertinent  positives/negatives)  Basic metabolic panel within normal limits White blood cell count 11.8, hemoglobin 11.8, platelet count 198 Troponin 0.06 Repeat troponin 0.05  ____________________________________________  RADIOLOGY All Xrays were viewed by me. Imaging interpreted by Radiologist.  CT head noncontrast:IMPRESSION: 1. No evidence of acute intracranial abnormality. 2. Mild chronic small vessel ischemic disease.  Ct neck noncontrast:  IMPRESSION: No acute fracture or subluxation. Multilevel mild degenerative changes as described above. __________________________________________  PROCEDURES  Procedure(s) performed: None  Critical Care performed: None  ____________________________________________   ED COURSE / ASSESSMENT AND PLAN  CONSULTATIONS: None  Pertinent labs & imaging results that were available during my care of the patient were reviewed by me and considered in my medical decision making (see chart for details).   On arrival patient's headache pain clinically seems to be due to muscular spasm/tension. She is overall well-appearing and has no high risk clinical symptoms for headache emergency, however given her age I am going to CT her head. Some of the pain is down into the left side of the neck, and she has had a spontaneous fracture in her foot, so I discussed with the family obtaining C-spine CT as well.  CT head and neck are negative for acute acute cervical or intracranial injury/source of headache/neck pain.  I did treat the patient's pain with fentanyl initially and nausea with Zofran. Patient states she did have some improvement was still having some spasm and tension in  the left trapezius and somewhat in the right trapezius muscle. Patient was given Valium with some improvement.  Patient states that she believes her headache is due to rheumatoid arthritis flare. Patient states that when this is happening before she has done a prednisone taper and is  helped her. I will go ahead and start her on a taper.  After she has had some persistent nausea, I did send her laboratory studies, and her CBC and metabolic panel were without significant abnormalities. Her white blood cell count is minimally elevated, and this is nonspecific. She has no symptoms of infections. Her troponin was minimally elevated, and her EKG was normal in appearance. A repeat troponin was sent and this was decreased to 0.05 from 0.06 initially. She's not had any chest discomfort. I'm not suspicious of carotid dissection as the symptoms are somewhat bilateral. I discussed consideration of this diagnosis with the family, the patient states she believes it's her rheumatoid arthritis flare and would like to go home on a prednisone taper. We discussed return precautions.  Patient / Family / Caregiver informed of clinical course, medical decision-making process, and agree with plan.   I discussed return precautions, follow-up instructions, and discharged instructions with patient and/or family.  ___________________________________________   FINAL CLINICAL IMPRESSION(S) / ED DIAGNOSES   Final diagnoses:  Headache, unspecified headache type  Muscle spasms of neck       Lisa Roca, MD 06/07/15 2030

## 2015-06-07 NOTE — ED Notes (Signed)
Pt's sats dropping to 88% RA while resting, 2L of O2 by [place] placed, cont to monitor

## 2015-06-07 NOTE — ED Notes (Signed)
Family at bedside.  I was asked about fluids that they understood would be given to patient.  I told them both the doctor and nurse were busy in another room but the first one I saw I would make an inquiry so they would know what was happening.  I tucked patient in with warm blanket. They were very appreciative

## 2015-06-07 NOTE — ED Notes (Signed)
Awoke with pain back of head and neck. Denies injury. Has history or RA.

## 2015-06-07 NOTE — ED Notes (Signed)
Notified of elevated troponin 0.06. EDP made aware

## 2015-06-09 DIAGNOSIS — M79672 Pain in left foot: Secondary | ICD-10-CM | POA: Diagnosis not present

## 2015-06-12 ENCOUNTER — Encounter: Admission: RE | Payer: Self-pay | Source: Ambulatory Visit

## 2015-06-12 ENCOUNTER — Ambulatory Visit
Admission: RE | Admit: 2015-06-12 | Payer: Medicare Other | Source: Ambulatory Visit | Admitting: Unknown Physician Specialty

## 2015-06-12 SURGERY — COLONOSCOPY WITH PROPOFOL
Anesthesia: General

## 2015-06-13 ENCOUNTER — Other Ambulatory Visit: Payer: Self-pay | Admitting: Internal Medicine

## 2015-06-16 ENCOUNTER — Ambulatory Visit (INDEPENDENT_AMBULATORY_CARE_PROVIDER_SITE_OTHER): Payer: Medicare Other | Admitting: Internal Medicine

## 2015-06-16 ENCOUNTER — Encounter: Payer: Self-pay | Admitting: Internal Medicine

## 2015-06-16 VITALS — BP 118/76 | HR 60 | Temp 97.8°F | Resp 18 | Ht 64.0 in | Wt 169.0 lb

## 2015-06-16 DIAGNOSIS — E7800 Pure hypercholesterolemia, unspecified: Secondary | ICD-10-CM

## 2015-06-16 DIAGNOSIS — Z658 Other specified problems related to psychosocial circumstances: Secondary | ICD-10-CM

## 2015-06-16 DIAGNOSIS — F439 Reaction to severe stress, unspecified: Secondary | ICD-10-CM

## 2015-06-16 DIAGNOSIS — I10 Essential (primary) hypertension: Secondary | ICD-10-CM | POA: Diagnosis not present

## 2015-06-16 DIAGNOSIS — R197 Diarrhea, unspecified: Secondary | ICD-10-CM

## 2015-06-16 DIAGNOSIS — R7989 Other specified abnormal findings of blood chemistry: Secondary | ICD-10-CM

## 2015-06-16 DIAGNOSIS — M069 Rheumatoid arthritis, unspecified: Secondary | ICD-10-CM

## 2015-06-16 DIAGNOSIS — K838 Other specified diseases of biliary tract: Secondary | ICD-10-CM

## 2015-06-16 DIAGNOSIS — D649 Anemia, unspecified: Secondary | ICD-10-CM

## 2015-06-16 DIAGNOSIS — R945 Abnormal results of liver function studies: Secondary | ICD-10-CM

## 2015-06-16 NOTE — Progress Notes (Signed)
Patient ID: Kaitlyn Huff, female   DOB: 11-19-1940, 74 y.o.   MRN: [id number]   Subjective:    Patient ID: Kaitlyn Huff, female    DOB: January 19, 1941, 74 y.o.   MRN: [id number]  HPI  Patient with past history of RA followed by Dr [name], hypertension, hypercholesterolemia, anemia and GERD.  She comes in today to follow up on these issues.  She is accompanied by her daughter.  History obtained from both of them.  Suffered a left foot metatarsal fracture.  Has ben seeing Dr Vickki Muff.  In a athletic shoe now.  Is due to get her infusion next week.  States that yesterday she felt bad.  Had decreased energy.  Feels better today.  No increased cough or congestion.  No sob.  No chest pain or tightness.  No acid reflux.  No abdominal pain or cramping.  Bowels are better since being on colestid and align.  Saw GI.  Started two days ago with a dry cough.  Discussed robitussin.  Is better.     Past Medical History  Diagnosis Date  . Rheumatoid arthritis(714.0)     MTX transaminitis, Leflunomide (rash), enbrel, plaquinil, prednisone, remicade, Imuran (transaminitis)  . Hyperlipidemia   . Diverticulitis   . Osteoarthritis   . GERD (gastroesophageal reflux disease)   . Hypertension   . Anemia   . Positive PPD     s/p INH (2006)  . Osteoporosis     actonel  . 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 Abdominal hysterectomy    . Tracheostomy  1959  . Tubal ligation    . Cervical cone biopsy    . Ovary surgery    . Cholecystectomy  06/22/14   Family History  Problem Relation Age of Onset  . Heart disease Father     MI  . Heart disease Mother   . Valvular heart disease Mother   . Colon cancer Neg Hx   . Breast cancer Sister 12   Social History   Social History  . Marital Status: Widowed    Spouse Name: N/A  . Number of Children: 4  . Years of Education: N/A   Social History Main Topics  . Smoking status: Never Smoker   .  Smokeless tobacco: Never Used  . Alcohol Use: No  . Drug Use: No  . Sexual Activity: Not Asked   Other Topics Concern  . None   Social History Narrative    Outpatient Encounter Prescriptions as of 06/16/2015  Medication Sig  . ADVAIR DISKUS 250-50 MCG/DOSE AEPB USE 1 INHALATION INTO THE LUNGS EVERY 12 HOURS  . alendronate (FOSAMAX) 70 MG tablet Take 70 mg by mouth once a week. Take with a full glass of water on an empty stomach.  . ALPRAZolam (XANAX) 0.25 MG tablet 1 tablet q day prn  . aspirin 81 MG tablet Take 81 mg by mouth daily.  . calcium-vitamin D (OSCAL 500/200 D-3) 500-200 MG-UNIT per tablet Take 1 tablet by mouth 2 (two) times daily.  . cetirizine (ZYRTEC) 10 MG tablet TAKE 1 TABLET DAILY  . colestipol (COLESTID) 1 G tablet Take by mouth.  . DPH-Lido-AlHydr-MgHydr-Simeth (FIRST-MOUTHWASH BLM) SUSP take 5 to 10 milliliters by mouth SWISH AND SPIT three times a day  . FeFum-FePo-FA-B Cmp-C-Zn-Mn-Cu (TANDEM PLUS) 162-115.2-1 MG CAPS TAKE 1 CAPSULE DAILY  . furosemide (LASIX) 20 MG tablet TAKE 1 TABLET DAILY  .  gabapentin (NEURONTIN) 300 MG capsule Take 300 mg by mouth 4 (four) times daily.  Marland Kitchen HYDROcodone-acetaminophen (NORCO/VICODIN) 5-325 MG tablet Take 1 tablet by mouth every 8 (eight) hours as needed for moderate pain or severe pain.  . hydroxychloroquine (PLAQUENIL) 200 MG tablet Take 1 tablet (200 mg total) by mouth 2 (two) times daily.  . InFLIXimab (REMICADE IV) Inject every 6 weeks  . metoprolol succinate (TOPROL-XL) 50 MG 24 hr tablet TAKE 1 TABLET (50 MG TOTAL) DAILY. TAKE WITH OR IMMEDIATELY FOLLOWING A MEAL  . Multiple Vitamin (MULTIVITAMIN) capsule Take 1 capsule by mouth daily.  Marland Kitchen NEXIUM 40 MG capsule TAKE 1 CAPSULE (40 MG TOTAL) TWICE A DAY BEFORE A MEAL  . potassium chloride (K-DUR) 10 MEQ tablet Take 10 mEq by mouth once.   . potassium chloride (MICRO-K) 10 MEQ CR capsule TAKE 2 CAPSULES TWICE A DAY  . predniSONE (DELTASONE) 10 MG tablet 40mg  daily then 30mg   daily then 20 mg daily then 10 mg daily  . simvastatin (ZOCOR) 20 MG tablet TAKE 1 TABLET DAILY  . sodium chloride (OCEAN) 0.65 % nasal spray Place 1 spray into the nose as needed for congestion.  . traMADol (ULTRAM) 50 MG tablet Take 1 tablet (50 mg total) by mouth at bedtime as needed.   No facility-administered encounter medications on file as of 06/16/2015.    Review of Systems  Constitutional: Negative for appetite change and unexpected weight change.  HENT: Negative for congestion and sinus pressure.   Eyes: Negative for pain and discharge.  Respiratory: Positive for cough. Negative for chest tightness and shortness of breath.   Cardiovascular: Negative for chest pain, palpitations and leg swelling.  Gastrointestinal: Negative for nausea, vomiting, abdominal pain and diarrhea.  Genitourinary: Negative for dysuria and difficulty urinating.  Musculoskeletal: Negative for myalgias and neck stiffness.  Skin: Negative for color change and rash.  Neurological: Negative for dizziness, light-headedness and headaches.  Psychiatric/Behavioral: Negative for dysphoric mood and agitation.       Objective:    Physical Exam  Constitutional: She appears well-developed and well-nourished. No distress.  HENT:  Nose: Nose normal.  Mouth/Throat: Oropharynx is clear and moist.  Eyes: Conjunctivae are normal. Right eye exhibits no discharge. Left eye exhibits no discharge.  Neck: Neck supple. No thyromegaly present.  Cardiovascular: Normal rate and regular rhythm.   Pulmonary/Chest: Breath sounds normal. No respiratory distress. She has no wheezes.  Abdominal: Soft. Bowel sounds are normal. There is no tenderness.  Musculoskeletal: She exhibits no edema or tenderness.  Lymphadenopathy:    She has no cervical adenopathy.  Skin: No rash noted. No erythema.  Psychiatric: She has a normal mood and affect. Her behavior is normal.    BP 118/76 mmHg  Pulse 60  Temp(Src) 97.8 F (36.6 C) (Oral)   Resp 18  Ht 5\' 4"  (1.626 m)  Wt 169 lb (76.658 kg)  BMI 28.99 kg/m2  SpO2 97% Wt Readings from Last 3 Encounters:  06/16/15 169 lb (76.658 kg)  06/07/15 165 lb (74.844 kg)  04/14/15 168 lb 8 oz (76.431 kg)     Lab Results  Component Value Date   WBC 11.8* 06/07/2015   HGB 11.8* 06/07/2015   HCT 36.0 06/07/2015   PLT 198 06/07/2015   GLUCOSE 81 06/07/2015   CHOL 128 04/13/2015   TRIG 119.0 04/13/2015   HDL 58.30 04/13/2015   LDLDIRECT 73.7 08/20/2013   LDLCALC 46 04/13/2015   ALT 24 04/13/2015   AST 24 04/13/2015   NA  140 06/07/2015   K 3.9 06/07/2015   CL 103 06/07/2015   CREATININE 0.91 06/07/2015   BUN 8 06/07/2015   CO2 27 06/07/2015   TSH 2.72 04/13/2015   INR 1.0 11/01/2014    Ct Head Wo Contrast  06/07/2015  CLINICAL DATA:  Pain in the back of the head and neck. EXAM: CT HEAD WITHOUT CONTRAST TECHNIQUE: Contiguous axial images were obtained from the base of the skull through the vertex without intravenous contrast. COMPARISON:  06/21/2014 FINDINGS: Mild cerebral atrophy is unchanged and within normal limits for age. Periventricular white-matter hypodensities are similar to the prior study and nonspecific but compatible with mild chronic small vessel ischemic disease. There is no evidence of acute cortical infarct, intracranial hemorrhage, mass, midline shift, or extra-axial fluid collection. Prior bilateral cataract extraction is noted. Visualized paranasal sinuses and mastoid air cells are clear. Mild carotid siphon calcification is noted. IMPRESSION: 1. No evidence of acute intracranial abnormality. 2. Mild chronic small vessel ischemic disease. Electronically Signed   By: Logan Bores M.D.   On: 06/07/2015 12:37   Ct Cervical Spine Wo Contrast  06/07/2015  CLINICAL DATA:  Back pain, neck pain EXAM: CT CERVICAL SPINE WITHOUT CONTRAST TECHNIQUE: Multidetector CT imaging of the cervical spine was performed without intravenous contrast. Multiplanar CT image  reconstructions were also generated. COMPARISON:  06/21/2014 FINDINGS: Axial images of the cervical spine shows no acute fracture or subluxation. Computer processed images shows no acute fracture or subluxation. There is no pneumothorax in visualized lung apices. Multilevel facet degenerative changes are noted. C1-C2 degenerative changes are noted. Mild disc space flattening with posterior spurring at C3-C4 level. Mild posterior disc bulge and minimal disc space flattening at C4-C5 level. Moderate disc space flattening with mild anterior and mild posterior spurring at C5-C6 level. Minimal anterior spurring and posterior disc bulge at C6-C7 level. No prevertebral soft tissue swelling. Cervical airway is patent. IMPRESSION: No acute fracture or subluxation. Multilevel mild degenerative changes as described above. Electronically Signed   By: Lahoma Crocker M.D.   On: 06/07/2015 14:20       Assessment & Plan:   Problem List Items Addressed This Visit    Abnormal liver function test    Abdominal ultrasound revealed fatty liver.  Follow liver panel.        Anemia    Cbc followed through Dr [name].        Diarrhea    Saw GI.  See their note for details.  Better on colestid and align.  Follow.       Dilated bile duct    Saw GI.  They felt no further w/up warranted.       Hypercholesterolemia    On simvastatin.  Low cholesterol diet and exercise.  Follow lipid panel and liver function tests.        Hypertension - Primary    Blood pressure under good control.  Continue same medication regimen.  Follow pressures.  Follow metabolic panel.        Rheumatoid arthritis ([place])    Followed by Dr [name].  Due to get infusion next week.  Follow.  Confirm no active infection.       Stress    Handling stress well.  Follow.           [name], MD

## 2015-06-16 NOTE — Progress Notes (Signed)
Pre-visit discussion using our clinic review tool. No additional management support is needed unless otherwise documented below in the visit note.  

## 2015-06-21 ENCOUNTER — Encounter: Payer: Self-pay | Admitting: Internal Medicine

## 2015-06-21 NOTE — Assessment & Plan Note (Signed)
Abdominal ultrasound revealed fatty liver.  Follow liver panel.

## 2015-06-21 NOTE — Assessment & Plan Note (Signed)
Blood pressure under good control.  Continue same medication regimen.  Follow pressures.  Follow metabolic panel.   

## 2015-06-21 NOTE — Assessment & Plan Note (Signed)
Followed by Dr [name].  Due to get infusion next week.  Follow.  Confirm no active infection.

## 2015-06-21 NOTE — Assessment & Plan Note (Signed)
Cbc followed through Dr [name].

## 2015-06-21 NOTE — Assessment & Plan Note (Signed)
Saw GI.  They felt no further w/up warranted.

## 2015-06-21 NOTE — Assessment & Plan Note (Signed)
Saw GI.  See their note for details.  Better on colestid and align.  Follow.

## 2015-06-21 NOTE — Assessment & Plan Note (Signed)
On simvastatin.  Low cholesterol diet and exercise.  Follow lipid panel and liver function tests.   

## 2015-06-21 NOTE — Assessment & Plan Note (Signed)
Handling stress well.  Follow.  

## 2015-06-22 DIAGNOSIS — M0579 Rheumatoid arthritis with rheumatoid factor of multiple sites without organ or systems involvement: Secondary | ICD-10-CM | POA: Diagnosis not present

## 2015-06-24 DIAGNOSIS — I10 Essential (primary) hypertension: Secondary | ICD-10-CM | POA: Diagnosis not present

## 2015-06-24 DIAGNOSIS — E782 Mixed hyperlipidemia: Secondary | ICD-10-CM | POA: Diagnosis not present

## 2015-06-24 DIAGNOSIS — I059 Rheumatic mitral valve disease, unspecified: Secondary | ICD-10-CM | POA: Diagnosis not present

## 2015-07-03 ENCOUNTER — Other Ambulatory Visit: Payer: Self-pay | Admitting: Internal Medicine

## 2015-07-07 ENCOUNTER — Telehealth: Payer: Self-pay | Admitting: Internal Medicine

## 2015-07-07 NOTE — Telephone Encounter (Signed)
Pt would like to know if Dr. Nicki Reaper will ok her to get a flu shot. Please call and let her know.  Thank you

## 2015-07-07 NOTE — Telephone Encounter (Signed)
Please advise 

## 2015-07-08 NOTE — Telephone Encounter (Signed)
Spoke with patient and she is not having any acute symptoms at this time nor has had problems with Flu shot in the past. She will check with Dr. [name] about if it is ok to have and then call back for nurse visit.

## 2015-07-08 NOTE — Telephone Encounter (Signed)
As long as she is not having any acute infection and as long as Dr [name] ok with her getting - ok to get flu shot.  Also, confirm that she has not previously had any problems taking the flu shot.

## 2015-07-09 ENCOUNTER — Encounter: Payer: Self-pay | Admitting: Anesthesiology

## 2015-07-09 ENCOUNTER — Ambulatory Visit
Admission: RE | Admit: 2015-07-09 | Discharge: 2015-07-09 | Disposition: A | Discharge status: Home / Self Care | Payer: Medicare Other | Source: Ambulatory Visit | Attending: Unknown Physician Specialty | Admitting: Unknown Physician Specialty

## 2015-07-09 ENCOUNTER — Encounter
Admission: RE | Disposition: A | Discharge status: Home / Self Care | Payer: Self-pay | Source: Ambulatory Visit | Attending: Unknown Physician Specialty

## 2015-07-09 ENCOUNTER — Ambulatory Visit: Payer: Medicare Other | Admitting: Anesthesiology

## 2015-07-09 DIAGNOSIS — I361 Nonrheumatic tricuspid (valve) insufficiency: Secondary | ICD-10-CM | POA: Insufficient documentation

## 2015-07-09 DIAGNOSIS — Z1211 Encounter for screening for malignant neoplasm of colon: Secondary | ICD-10-CM | POA: Insufficient documentation

## 2015-07-09 DIAGNOSIS — R12 Heartburn: Secondary | ICD-10-CM | POA: Insufficient documentation

## 2015-07-09 DIAGNOSIS — M1990 Unspecified osteoarthritis, unspecified site: Secondary | ICD-10-CM | POA: Insufficient documentation

## 2015-07-09 DIAGNOSIS — Z881 Allergy status to other antibiotic agents status: Secondary | ICD-10-CM | POA: Insufficient documentation

## 2015-07-09 DIAGNOSIS — M069 Rheumatoid arthritis, unspecified: Secondary | ICD-10-CM | POA: Diagnosis not present

## 2015-07-09 DIAGNOSIS — K5730 Diverticulosis of large intestine without perforation or abscess without bleeding: Secondary | ICD-10-CM | POA: Insufficient documentation

## 2015-07-09 DIAGNOSIS — G4730 Sleep apnea, unspecified: Secondary | ICD-10-CM | POA: Insufficient documentation

## 2015-07-09 DIAGNOSIS — Z885 Allergy status to narcotic agent status: Secondary | ICD-10-CM | POA: Diagnosis not present

## 2015-07-09 DIAGNOSIS — M810 Age-related osteoporosis without current pathological fracture: Secondary | ICD-10-CM | POA: Insufficient documentation

## 2015-07-09 DIAGNOSIS — D123 Benign neoplasm of transverse colon: Secondary | ICD-10-CM | POA: Diagnosis not present

## 2015-07-09 DIAGNOSIS — K6389 Other specified diseases of intestine: Secondary | ICD-10-CM | POA: Diagnosis not present

## 2015-07-09 DIAGNOSIS — D649 Anemia, unspecified: Secondary | ICD-10-CM | POA: Diagnosis not present

## 2015-07-09 DIAGNOSIS — K5790 Diverticulosis of intestine, part unspecified, without perforation or abscess without bleeding: Secondary | ICD-10-CM | POA: Diagnosis not present

## 2015-07-09 DIAGNOSIS — K210 Gastro-esophageal reflux disease with esophagitis: Secondary | ICD-10-CM | POA: Diagnosis not present

## 2015-07-09 DIAGNOSIS — K2960 Other gastritis without bleeding: Secondary | ICD-10-CM | POA: Diagnosis not present

## 2015-07-09 DIAGNOSIS — K2970 Gastritis, unspecified, without bleeding: Secondary | ICD-10-CM | POA: Diagnosis not present

## 2015-07-09 DIAGNOSIS — Z882 Allergy status to sulfonamides status: Secondary | ICD-10-CM | POA: Diagnosis not present

## 2015-07-09 DIAGNOSIS — Z888 Allergy status to other drugs, medicaments and biological substances status: Secondary | ICD-10-CM | POA: Insufficient documentation

## 2015-07-09 DIAGNOSIS — K2950 Unspecified chronic gastritis without bleeding: Secondary | ICD-10-CM | POA: Insufficient documentation

## 2015-07-09 DIAGNOSIS — I340 Nonrheumatic mitral (valve) insufficiency: Secondary | ICD-10-CM | POA: Diagnosis not present

## 2015-07-09 DIAGNOSIS — E785 Hyperlipidemia, unspecified: Secondary | ICD-10-CM | POA: Insufficient documentation

## 2015-07-09 DIAGNOSIS — K635 Polyp of colon: Secondary | ICD-10-CM | POA: Diagnosis not present

## 2015-07-09 DIAGNOSIS — I10 Essential (primary) hypertension: Secondary | ICD-10-CM | POA: Insufficient documentation

## 2015-07-09 DIAGNOSIS — K640 First degree hemorrhoids: Secondary | ICD-10-CM | POA: Insufficient documentation

## 2015-07-09 DIAGNOSIS — K648 Other hemorrhoids: Secondary | ICD-10-CM | POA: Diagnosis not present

## 2015-07-09 HISTORY — PX: ESOPHAGOGASTRODUODENOSCOPY (EGD) WITH PROPOFOL: SHX5813

## 2015-07-09 HISTORY — PX: COLONOSCOPY WITH PROPOFOL: SHX5780

## 2015-07-09 LAB — HM COLONOSCOPY

## 2015-07-09 SURGERY — COLONOSCOPY WITH PROPOFOL
Anesthesia: General

## 2015-07-09 MED ORDER — PROPOFOL 500 MG/50ML IV EMUL
INTRAVENOUS | Status: DC | PRN
  Administered 2015-07-09: 120 ug/kg/min via INTRAVENOUS

## 2015-07-09 MED ORDER — GLYCOPYRROLATE 0.2 MG/ML IJ SOLN
INTRAMUSCULAR | Status: DC | PRN
  Administered 2015-07-09: 0.1 mg via INTRAVENOUS

## 2015-07-09 MED ORDER — LIDOCAINE HCL (CARDIAC) 20 MG/ML IV SOLN
INTRAVENOUS | Status: DC | PRN
  Administered 2015-07-09: 30 mg via INTRAVENOUS

## 2015-07-09 MED ORDER — FENTANYL CITRATE (PF) 100 MCG/2ML IJ SOLN
INTRAMUSCULAR | Status: DC | PRN
  Administered 2015-07-09: 50 ug via INTRAVENOUS

## 2015-07-09 MED ORDER — MIDAZOLAM HCL 2 MG/2ML IJ SOLN
INTRAMUSCULAR | Status: DC | PRN
  Administered 2015-07-09: 1 mg via INTRAVENOUS

## 2015-07-09 MED ORDER — SODIUM CHLORIDE 0.9 % IV SOLN
INTRAVENOUS | Status: DC
  Administered 2015-07-09: 1000 mL via INTRAVENOUS

## 2015-07-09 MED ORDER — SODIUM CHLORIDE 0.9 % IV SOLN: INTRAVENOUS | Status: DC

## 2015-07-09 NOTE — Op Note (Signed)
[facility] Gastroenterology Patient Name: Kaitlyn Huff Procedure Date: 07/09/2015 9:19 AM MRN: [id number] Account #: [id number] Date of Birth: [birth year] Admit Type: Outpatient Age: 74 Room: [facility] ENDO ROOM 1 Gender: Female Note Status: Finalized Procedure:         Colonoscopy Indications:       Screening for colorectal malignant neoplasm Providers:         Manya Silvas, MD Referring MD:      [name], MD (Referring MD) Medicines:         Propofol per Anesthesia Complications:     No immediate complications. Procedure:         Pre-Anesthesia Assessment:                    - After reviewing the risks and benefits, the patient was                     deemed in satisfactory condition to undergo the procedure.                    After obtaining informed consent, the colonoscope was                     passed under direct vision. Throughout the procedure, the                     patient's blood pressure, pulse, and oxygen saturations                     were monitored continuously. The Colonoscope was                     introduced through the anus and advanced to the the cecum,                     identified by appendiceal orifice and ileocecal valve. The                     colonoscopy was somewhat difficult due to restricted                     mobility of the colon, significant looping and a tortuous                     colon. Successful completion of the procedure was aided by                     applying abdominal pressure. The patient tolerated the                     procedure well. The quality of the bowel preparation was                     excellent. Findings:      The colon was tortuous and somewhat difficult.      Internal hemorrhoids were found during endoscopy. The hemorrhoids were       small and Grade I (internal hemorrhoids that do not prolapse).      A single medium-mouthed diverticulum was found at the hepatic flexure.      A  diminutive polyp was found in the transverse colon. The polyp was       sessile. The polyp was removed with a jumbo cold forceps. Resection and  retrieval were complete. Impression:        - Internal hemorrhoids.                    - Diverticulosis at the hepatic flexure.                    - One diminutive polyp in the transverse colon. Resected                     and retrieved. Recommendation:    - Await pathology results. Manya Silvas, MD 07/09/2015 10:11:03 AM This report has been signed electronically. Number of Addenda: 0 Note Initiated On: 07/09/2015 9:19 AM Scope Withdrawal Time: 0 hours 8 minutes 57 seconds  Total Procedure Duration: 0 hours 28 minutes 59 seconds       [facility]

## 2015-07-09 NOTE — Anesthesia Preprocedure Evaluation (Signed)
Anesthesia Evaluation  Patient identified by MRN, date of birth, ID band Patient awake    Reviewed: Allergy & Precautions, H&P , NPO status , Patient's Chart, lab work & pertinent test results, reviewed documented beta blocker date and time   Airway Mallampati: II  TM Distance: >3 FB Neck ROM: full    Dental no notable dental hx.    Pulmonary neg pulmonary ROS, sleep apnea , pneumonia, resolved,    Pulmonary exam normal breath sounds clear to auscultation       Cardiovascular Exercise Tolerance: Good hypertension, negative cardio ROS   Rhythm:regular Rate:Normal     Neuro/Psych negative neurological ROS  negative psych ROS   GI/Hepatic negative GI ROS, Neg liver ROS, GERD  ,  Endo/Other  negative endocrine ROS  Renal/GU negative Renal ROS  negative genitourinary   Musculoskeletal  (+) Arthritis , Rheumatoid disorders and steroids,    Abdominal   Peds  Hematology negative hematology ROS (+) anemia ,   Anesthesia Other Findings   Reproductive/Obstetrics negative OB ROS                             Anesthesia Physical Anesthesia Plan  ASA: III  Anesthesia Plan: General   Post-op Pain Management:    Induction:   Airway Management Planned:   Additional Equipment:   Intra-op Plan:   Post-operative Plan:   Informed Consent: I have reviewed the patients History and Physical, chart, labs and discussed the procedure including the risks, benefits and alternatives for the proposed anesthesia with the patient or authorized representative who has indicated his/her understanding and acceptance.   Dental Advisory Given  Plan Discussed with: CRNA  Anesthesia Plan Comments:         Anesthesia Quick Evaluation

## 2015-07-09 NOTE — Op Note (Signed)
[facility] Gastroenterology Patient Name: Kaitlyn Huff Procedure Date: 07/09/2015 9:21 AM MRN: [id number] Account #: [id number] Date of Birth: 02/15/1941 Admit Type: Outpatient Age: 74 Room: [facility] ENDO ROOM 1 Gender: Female Note Status: Finalized Procedure:         Upper GI endoscopy Indications:       Heartburn Providers:         Manya Silvas, MD Referring MD:      [name], MD (Referring MD) Medicines:         Propofol per Anesthesia Complications:     No immediate complications. Procedure:         Pre-Anesthesia Assessment:                    - After reviewing the risks and benefits, the patient was                     deemed in satisfactory condition to undergo the procedure.                    After obtaining informed consent, the endoscope was passed                     under direct vision. Throughout the procedure, the                     patient's blood pressure, pulse, and oxygen saturations                     were monitored continuously. The Endoscope was introduced                     through the mouth, and advanced to the second part of                     duodenum. The upper GI endoscopy was accomplished without                     difficulty. The patient tolerated the procedure well. Findings:      Esophagitis with no bleeding was found 39 cm from the incisors. Biopsies       were taken with a cold forceps for histology.      Patchy mild inflammation characterized by erythema and granularity was       found in the gastric body. Biopsies were taken with a cold forceps for       histology. Biopsies were taken with a cold forceps for Helicobacter       pylori testing.      Localized mild inflammation characterized by erythema and granularity       was found in the gastric antrum. Biopsies were taken with a cold forceps       for histology. Biopsies were taken with a cold forceps for Helicobacter       pylori testing.      The examined  duodenum was normal.      A small verrocus sessile bump seen on hypopharynx just above the tip of       the epiglottis. Impression:        - Reflux esophagitis. Rule out Barrett's esophagus.                     Biopsied.                    -  Gastritis. Biopsied.                    - Gastritis. Biopsied.                    - Normal examined duodenum. Recommendation:    - Await pathology results.                    - Perform a colonoscopy as previously scheduled. Manya Silvas, MD 07/09/2015 9:37:22 AM This report has been signed electronically. Number of Addenda: 0 Note Initiated On: 07/09/2015 9:21 AM      [facility]

## 2015-07-09 NOTE — Transfer of Care (Signed)
Immediate Anesthesia Transfer of Care Note  Patient: Kaitlyn Huff  Procedure(s) Performed: Procedure(s): COLONOSCOPY WITH PROPOFOL (N/A) ESOPHAGOGASTRODUODENOSCOPY (EGD) WITH PROPOFOL (N/A)  Patient Location: PACU  Anesthesia Type:General  Level of Consciousness: awake and unresponsive  Airway & Oxygen Therapy: Patient Spontanous Breathing and Patient connected to nasal cannula oxygen  Post-op Assessment: Report given to RN and Post -op Vital signs reviewed and stable  Post vital signs: Reviewed and stable  Last Vitals:  Filed Vitals:   07/09/15 0833  BP: 165/73  Pulse: 63  Temp: 36.4 C  Resp: 17    Complications: No apparent anesthesia complications

## 2015-07-09 NOTE — Anesthesia Procedure Notes (Signed)
Performed by: COOK-MARTIN, Samaad Hashem Pre-anesthesia Checklist: Patient identified, Emergency Drugs available, Suction available, Patient being monitored and Timeout performed Patient Re-evaluated:Patient Re-evaluated prior to inductionOxygen Delivery Method: Nasal cannula Preoxygenation: Pre-oxygenation with 100% oxygen Intubation Type: IV induction Placement Confirmation: positive ETCO2 and CO2 detector       

## 2015-07-09 NOTE — H&P (Signed)
Primary Care Physician:  [name], MD Primary Gastroenterologist:  Dr. Vira Agar  Pre-Procedure History & Physical: HPI:  Kaitlyn Huff is a 74 y.o. female is here for an endoscopy and colonoscopy.   Past Medical History  Diagnosis Date  . Rheumatoid arthritis(714.0)     MTX transaminitis, Leflunomide (rash), enbrel, plaquinil, prednisone, remicade, Imuran (transaminitis)  . Hyperlipidemia   . Diverticulitis   . Osteoarthritis   . GERD (gastroesophageal reflux disease)   . Hypertension   . Anemia   . Positive PPD     s/p INH (2006)  . Osteoporosis     actonel  . 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 Abdominal hysterectomy    . Tracheostomy  1959  . Tubal ligation    . Cervical cone biopsy    . Ovary surgery    . Cholecystectomy  06/22/14    Prior to Admission medications   Medication Sig Start Date End Date Taking? Authorizing Provider  ADVAIR DISKUS 250-50 MCG/DOSE AEPB USE 1 INHALATION INTO THE LUNGS EVERY 12 HOURS 05/19/15  Yes [name], MD  alendronate (FOSAMAX) 70 MG tablet Take 70 mg by mouth once a week. Take with a full glass of water on an empty stomach.   Yes Historical Provider, MD  ALPRAZolam [name]) 0.25 MG tablet 1 tablet q day prn 03/18/15  Yes Jayce G Cook, DO  aspirin 81 MG tablet Take 81 mg by mouth daily.   Yes Historical Provider, MD  calcium-vitamin D (OSCAL 500/200 D-3) 500-200 MG-UNIT per tablet Take 1 tablet by mouth 2 (two) times daily.   Yes Historical Provider, MD  cetirizine (ZYRTEC) 10 MG tablet TAKE 1 TABLET DAILY 12/01/14  Yes [name], MD  colestipol (COLESTID) 1 G tablet Take by mouth. 04/09/15 04/08/16 Yes Historical Provider, MD  DPH-Lido-AlHydr-MgHydr-Simeth (FIRST-MOUTHWASH BLM) SUSP take 5 to 10 milliliters by mouth SWISH AND SPIT three times a day 03/31/15  Yes [name], MD  FeFum-FePo-FA-B Cmp-C-Zn-Mn-Cu (TANDEM PLUS) 162-115.2-1 MG CAPS TAKE 1  CAPSULE DAILY 08/04/14  Yes [name], MD  furosemide (LASIX) 20 MG tablet TAKE 1 TABLET DAILY 03/17/15  Yes [name], MD  gabapentin (NEURONTIN) 300 MG capsule Take 300 mg by mouth 4 (four) times daily.   Yes Historical Provider, MD  InFLIXimab (REMICADE IV) Inject every 6 weeks   Yes Historical Provider, MD  metoprolol succinate (TOPROL-XL) 50 MG 24 hr tablet TAKE 1 TABLET (50 MG TOTAL) DAILY. TAKE WITH OR IMMEDIATELY FOLLOWING A MEAL 12/31/14  Yes [name], MD  Multiple Vitamin (MULTIVITAMIN) capsule Take 1 capsule by mouth daily.   Yes Historical Provider, MD  NEXIUM 40 MG capsule TAKE 1 CAPSULE TWICE A DAY BEFORE A MEAL 07/03/15  Yes [name], MD  potassium chloride (K-DUR) 10 MEQ tablet Take 10 mEq by mouth once.    Yes Historical Provider, MD  potassium chloride (MICRO-K) 10 MEQ CR capsule TAKE 2 CAPSULES TWICE A DAY 06/15/15  Yes [name], MD  predniSONE (DELTASONE) 10 MG tablet 40mg  daily then 30mg  daily then 20 mg daily then 10 mg daily 06/07/15  Yes Lisa Roca, MD  simvastatin (ZOCOR) 20 MG tablet TAKE 1 TABLET DAILY 05/28/15  Yes [name], MD  sodium chloride (OCEAN) 0.65 % nasal spray Place 1 spray into the nose as needed for congestion.   Yes Historical Provider, MD  traMADol (ULTRAM) 50 MG tablet Take 1  tablet (50 mg total) by mouth at bedtime as needed. 09/01/14  Yes Rubbie Battiest, NP  HYDROcodone-acetaminophen (NORCO/VICODIN) 5-325 MG tablet Take 1 tablet by mouth every 8 (eight) hours as needed for moderate pain or severe pain. 06/07/15   Lisa Roca, MD  hydroxychloroquine (PLAQUENIL) 200 MG tablet Take 1 tablet (200 mg total) by mouth 2 (two) times daily. 05/13/13   [name], MD    Allergies as of 06/16/2015 - Review Complete 06/16/2015  Allergen Reaction Noted  . Astelin [azelastine hcl]  08/12/2012  . Ciprofloxacin Other (See Comments) 08/12/2012  . Codeine Other (See Comments) 08/10/2012  . Dilaudid [hydromorphone]  08/10/2012  .  Flexeril [cyclobenzaprine]  08/10/2012  . Imuran [azathioprine] Other (See Comments) 08/12/2012  . Keflex [cephalexin] Other (See Comments) 08/12/2012  . Lisinopril Itching 08/10/2012  . Lyrica [pregabalin] Other (See Comments) 08/12/2012  . Methotrexate derivatives Other (See Comments) 08/12/2012  . Amoxicillin Rash 08/10/2012  . Arava [leflunomide] Rash 08/12/2012  . Clindamycin/lincomycin Rash 08/12/2012  . Doxycycline Rash 08/12/2012  . Lodine [etodolac] Rash 08/10/2012  . Percocet [oxycodone-acetaminophen] Rash 08/12/2012  . Sulfa antibiotics Rash 08/10/2012    Family History  Problem Relation Age of Onset  . Heart disease Father     MI  . Heart disease Mother   . Valvular heart disease Mother   . Colon cancer Neg Hx   . Breast cancer Sister 21    Social History   Social History  . Marital Status: Widowed    Spouse Name: N/A  . Number of Children: 4  . Years of Education: N/A   Occupational History  . Not on file.   Social History Main Topics  . Smoking status: Never Smoker   . Smokeless tobacco: Never Used  . Alcohol Use: No  . Drug Use: No  . Sexual Activity: Not on file   Other Topics Concern  . Not on file   Social History Narrative    Review of Systems: See HPI, otherwise negative ROS Colestid has helped her diarrhea very much.  Physical Exam: BP 165/73 mmHg  Pulse 63  Temp(Src) 97.6 F (36.4 C) (Tympanic)  Resp 17  Ht 5\' 4"  (1.626 m)  Wt 74.844 kg (165 lb)  BMI 28.31 kg/m2  SpO2 99% General:   Alert,  pleasant and cooperative in NAD Head:  Normocephalic and atraumatic. Neck:  Supple; no masses or thyromegaly. Lungs:  Clear throughout to auscultation.    Heart:  Regular rate and rhythm. Abdomen:  Soft, nontender and nondistended. Normal bowel sounds, without guarding, and without rebound.   Neurologic:  Alert and  oriented x4;  grossly normal neurologically.  Impression/Plan: Kaitlyn Huff is here for an endoscopy and colonoscopy to be  performed for chronic diarrhea, GERD.  Risks, benefits, limitations, and alternatives regarding  endoscopy and colonoscopy have been reviewed with the patient.  Questions have been answered.  All parties agreeable.   Gaylyn Cheers, MD  07/09/2015, 9:19 AM

## 2015-07-10 ENCOUNTER — Encounter: Payer: Self-pay | Admitting: Internal Medicine

## 2015-07-10 ENCOUNTER — Other Ambulatory Visit: Payer: Self-pay | Admitting: Internal Medicine

## 2015-07-10 ENCOUNTER — Other Ambulatory Visit: Payer: Self-pay | Admitting: *Deleted

## 2015-07-10 DIAGNOSIS — Z86010 Personal history of colonic polyps: Secondary | ICD-10-CM | POA: Insufficient documentation

## 2015-07-10 LAB — SURGICAL PATHOLOGY

## 2015-07-10 MED ORDER — ALPRAZOLAM 0.25 MG PO TABS: ORAL_TABLET | ORAL | Status: DC

## 2015-07-10 NOTE — Telephone Encounter (Signed)
Patient has requested a medication refill alprazolam. Patient requested to use [facility].

## 2015-07-10 NOTE — Telephone Encounter (Signed)
Please advise in Dr. Bary Leriche absence. Thanks

## 2015-07-14 ENCOUNTER — Encounter: Payer: Self-pay | Admitting: Unknown Physician Specialty

## 2015-07-14 NOTE — Anesthesia Postprocedure Evaluation (Signed)
Anesthesia Post Note  Patient: Shanele J Vorndran  Procedure(s) Performed: Procedure(s) (LRB): COLONOSCOPY WITH PROPOFOL (N/A) ESOPHAGOGASTRODUODENOSCOPY (EGD) WITH PROPOFOL (N/A)  Patient location during evaluation: PACU Anesthesia Type: General Level of consciousness: awake and alert Pain management: pain level controlled Vital Signs Assessment: post-procedure vital signs reviewed and stable Respiratory status: spontaneous breathing, nonlabored ventilation, respiratory function stable and patient connected to nasal cannula oxygen Cardiovascular status: blood pressure returned to baseline and stable Postop Assessment: no signs of nausea or vomiting Anesthetic complications: no    Last Vitals:  Filed Vitals:   07/09/15 1030 07/09/15 1040  BP: 106/52 121/66  Pulse: 64 66  Temp:    Resp: 14 21    Last Pain:  Filed Vitals:   07/10/15 0800  PainSc: 0-No pain                 Molli Barrows

## 2015-07-15 ENCOUNTER — Other Ambulatory Visit: Payer: Self-pay | Admitting: Internal Medicine

## 2015-07-15 NOTE — Telephone Encounter (Signed)
Medication refilled last month with just 20 pills. Please advise?

## 2015-07-16 NOTE — Telephone Encounter (Signed)
Medication refilled 07/10/15 with 20 pills. Patient seen on 06/16/15. Please advise?

## 2015-07-16 NOTE — Telephone Encounter (Signed)
See previous note.  Should not need now if refilled on 06/29/16 for 20 tablets.   Thanks

## 2015-07-16 NOTE — Telephone Encounter (Signed)
Per note, this was refilled 07/09/16 for 20 tablets.  She should not need xanax at this time.  Thanks

## 2015-07-30 ENCOUNTER — Ambulatory Visit (INDEPENDENT_AMBULATORY_CARE_PROVIDER_SITE_OTHER): Payer: Medicare Other | Admitting: Family Medicine

## 2015-07-30 ENCOUNTER — Ambulatory Visit
Admission: RE | Admit: 2015-07-30 | Discharge: 2015-07-30 | Disposition: A | Discharge status: Home / Self Care | Payer: Medicare Other | Source: Ambulatory Visit | Attending: Family Medicine | Admitting: Family Medicine

## 2015-07-30 ENCOUNTER — Telehealth: Payer: Self-pay | Admitting: Family Medicine

## 2015-07-30 ENCOUNTER — Encounter: Payer: Self-pay | Admitting: Family Medicine

## 2015-07-30 VITALS — BP 110/62 | HR 70 | Temp 98.0°F | Ht 64.0 in | Wt 169.4 lb

## 2015-07-30 DIAGNOSIS — R059 Cough, unspecified: Secondary | ICD-10-CM

## 2015-07-30 DIAGNOSIS — R05 Cough: Secondary | ICD-10-CM | POA: Diagnosis not present

## 2015-07-30 DIAGNOSIS — R062 Wheezing: Secondary | ICD-10-CM | POA: Diagnosis not present

## 2015-07-30 DIAGNOSIS — R0602 Shortness of breath: Secondary | ICD-10-CM | POA: Diagnosis not present

## 2015-07-30 MED ORDER — ALBUTEROL SULFATE HFA 108 (90 BASE) MCG/ACT IN AERS: 2.0000 | INHALATION_SPRAY | RESPIRATORY_TRACT | Status: DC | PRN

## 2015-07-30 MED ORDER — ALBUTEROL SULFATE (2.5 MG/3ML) 0.083% IN NEBU
2.5000 mg | INHALATION_SOLUTION | Freq: Once | RESPIRATORY_TRACT | Status: AC
  Administered 2015-07-30: 2.5 mg via RESPIRATORY_TRACT

## 2015-07-30 MED ORDER — LEVOFLOXACIN 750 MG PO TABS: 750.0000 mg | ORAL_TABLET | Freq: Every day | ORAL | Status: DC

## 2015-07-30 NOTE — Telephone Encounter (Signed)
I called the patient and advised her that I had reviewed her chest x-ray. It appears that there is an infiltrate obscuring the left heart border. Given this and her symptoms in combination with her being immunosuppressed on Remicade we will start her on an antibiotic. She's tolerated Levaquin in the past for pneumonia. We'll treat her with this for 7 days. Patient's creatinine clearance is 56 and thus should tolerate Levaquin 750 mg daily. She has numerous other antibiotic allergies. She voiced understanding.

## 2015-07-30 NOTE — Progress Notes (Signed)
Patient ID: Kaitlyn Huff, female   DOB: 1940/11/19, 75 y.o.   MRN: [id number]  Tommi Rumps, MD Phone: [phone]  Kaitlyn Huff is a 75 y.o. female who presents today for same day visit.  Cough: Patient notes for the last 2-3 days she has had a mildly productive cough. Notes she is coughing up yellow phlegm. She notes occasionally she feels short of breath after coughing and sometimes mildly with walking around. She notes a tightness with the cough, though no exertional chest pain or chest pressure. No history of MI or stroke. She additionally notes rhinorrhea and congestion. She has sinus pressure with this as well. She notes wheezing as well. And some chills. No documented fevers. Does have a history of pneumonia. Did not smoke and does not have COPD. Denies sick contacts. Has received Levaquin in the past for pneumonia.  PMH: nonsmoker.   ROS see history of present illness  Objective  Physical Exam Filed Vitals:   07/30/15 1342  BP: 110/62  Pulse: 70  Temp: 98 F (36.7 C)    BP Readings from Last 3 Encounters:  07/30/15 110/62  07/09/15 121/66  06/16/15 118/76   Wt Readings from Last 3 Encounters:  07/30/15 169 lb 6.4 oz (76.839 kg)  07/09/15 165 lb (74.844 kg)  06/16/15 169 lb (76.658 kg)    Physical Exam  Constitutional: She is well-developed, well-nourished, and in no distress.  HENT:  Head: Normocephalic and atraumatic.  Right Ear: External ear normal.  Left Ear: External ear normal.  Mouth/Throat: Oropharynx is clear and moist. No oropharyngeal exudate.  Normal TMs bilaterally  Eyes: Conjunctivae are normal. Pupils are equal, round, and reactive to light.  Neck: Neck supple.  Cardiovascular: Normal rate, regular rhythm and normal heart sounds.  Exam reveals no gallop and no friction rub.   No murmur heard. Pulmonary/Chest: Effort normal. No respiratory distress. She has wheezes (minimal scattered wheezes).  Crackles right mid lung field    Lymphadenopathy:    She has no cervical adenopathy.  Neurological: She is alert. Gait normal.  Skin: Skin is warm and dry. She is not diaphoretic.   Patient received albuterol nebulizer treatment. Symptoms improved with this. Patient had normal lung sounds following nebulizer treatment.  Assessment/Plan: Please see individual problem list.  Cough Patient with several days of cough. Symptoms most consistent with bronchitis, though does have a history of pneumonia in the past. Lung sounds resolved with nebulizer treatment which could argue for bronchitis as opposed to pneumonia. She was afebrile and her vital signs are stable. We'll obtain a chest x-ray and send an albuterol inhaler to her pharmacy. We will await the chest x-ray results prior starting her on an antibiotic or prednisone. She is on an immunomodulator for her rheumatoid arthritis and thus would have a low threshold to start on antibiotics. She is given return precautions.    Orders Placed This Encounter  Procedures  . DG Chest 2 View    Standing Status: Future     Number of Occurrences:      Standing Expiration Date: 09/26/2016    Order Specific Question:  Reason for Exam (SYMPTOM  OR DIAGNOSIS REQUIRED)    Answer:  cough for 2 days, right mid lung field crackles    Order Specific Question:  Preferred imaging location?    Answer:  [place] ordered this encounter  Medications  . albuterol (PROVENTIL) (2.5 MG/3ML) 0.083% nebulizer solution 2.5 mg    Sig:   .  albuterol (PROVENTIL HFA;VENTOLIN HFA) 108 (90 Base) MCG/ACT inhaler    Sig: Inhale 2 puffs into the lungs every 4 (four) hours as needed for wheezing or shortness of breath.    Dispense:  1 Inhaler    Refill:  2    Tommi Rumps

## 2015-07-30 NOTE — Assessment & Plan Note (Addendum)
Patient with several days of cough. Symptoms most consistent with bronchitis, though does have a history of pneumonia in the past. Lung sounds resolved with nebulizer treatment which could argue for bronchitis as opposed to pneumonia. She was afebrile and her vital signs are stable. We'll obtain a chest x-ray and send an albuterol inhaler to her pharmacy. We will await the chest x-ray results prior starting her on an antibiotic or prednisone. She is on an immunomodulator for her rheumatoid arthritis and thus would have a low threshold to start on antibiotics. She is given return precautions.

## 2015-07-30 NOTE — Patient Instructions (Signed)
Nice to meet you. Your symptoms are likely related to bronchitis, though we are going to obtain a chest x-ray to rule out pneumonia. Please use the albuterol inhaler every 4 hours for the next 2 days and then as needed. If you develop chest pain, shortness of breath, cough productive of blood, fevers, or any new or change in symptoms please seek medical attention.

## 2015-07-30 NOTE — Progress Notes (Signed)
Pre visit review using our clinic review tool, if applicable. No additional management support is needed unless otherwise documented below in the visit note. 

## 2015-07-31 ENCOUNTER — Encounter: Payer: Self-pay | Admitting: Internal Medicine

## 2015-08-03 ENCOUNTER — Inpatient Hospital Stay
Admit: 2015-08-03 | Discharge: 2015-08-03 | Disposition: A | Discharge status: Home / Self Care | Payer: Medicare Other | Attending: Internal Medicine | Admitting: Internal Medicine

## 2015-08-03 ENCOUNTER — Emergency Department: Payer: Medicare Other

## 2015-08-03 ENCOUNTER — Encounter: Payer: Self-pay | Admitting: Internal Medicine

## 2015-08-03 ENCOUNTER — Inpatient Hospital Stay
Admission: EM | Admit: 2015-08-03 | Discharge: 2015-08-07 | DRG: 291 | Disposition: A | Discharge status: Home Health | Payer: Medicare Other | Attending: Internal Medicine | Admitting: Internal Medicine

## 2015-08-03 DIAGNOSIS — I081 Rheumatic disorders of both mitral and tricuspid valves: Secondary | ICD-10-CM | POA: Diagnosis present

## 2015-08-03 DIAGNOSIS — R05 Cough: Secondary | ICD-10-CM | POA: Diagnosis not present

## 2015-08-03 DIAGNOSIS — D649 Anemia, unspecified: Secondary | ICD-10-CM | POA: Diagnosis present

## 2015-08-03 DIAGNOSIS — R7611 Nonspecific reaction to tuberculin skin test without active tuberculosis: Secondary | ICD-10-CM | POA: Diagnosis present

## 2015-08-03 DIAGNOSIS — Z880 Allergy status to penicillin: Secondary | ICD-10-CM

## 2015-08-03 DIAGNOSIS — E871 Hypo-osmolality and hyponatremia: Secondary | ICD-10-CM

## 2015-08-03 DIAGNOSIS — Z7982 Long term (current) use of aspirin: Secondary | ICD-10-CM

## 2015-08-03 DIAGNOSIS — Z79899 Other long term (current) drug therapy: Secondary | ICD-10-CM

## 2015-08-03 DIAGNOSIS — D61818 Other pancytopenia: Secondary | ICD-10-CM

## 2015-08-03 DIAGNOSIS — D696 Thrombocytopenia, unspecified: Secondary | ICD-10-CM | POA: Diagnosis present

## 2015-08-03 DIAGNOSIS — I5033 Acute on chronic diastolic (congestive) heart failure: Secondary | ICD-10-CM | POA: Diagnosis present

## 2015-08-03 DIAGNOSIS — E785 Hyperlipidemia, unspecified: Secondary | ICD-10-CM | POA: Diagnosis present

## 2015-08-03 DIAGNOSIS — M069 Rheumatoid arthritis, unspecified: Secondary | ICD-10-CM | POA: Diagnosis present

## 2015-08-03 DIAGNOSIS — Z7952 Long term (current) use of systemic steroids: Secondary | ICD-10-CM

## 2015-08-03 DIAGNOSIS — N289 Disorder of kidney and ureter, unspecified: Secondary | ICD-10-CM | POA: Diagnosis present

## 2015-08-03 DIAGNOSIS — I340 Nonrheumatic mitral (valve) insufficiency: Secondary | ICD-10-CM | POA: Diagnosis not present

## 2015-08-03 DIAGNOSIS — M1990 Unspecified osteoarthritis, unspecified site: Secondary | ICD-10-CM | POA: Diagnosis present

## 2015-08-03 DIAGNOSIS — J9601 Acute respiratory failure with hypoxia: Secondary | ICD-10-CM

## 2015-08-03 DIAGNOSIS — G2581 Restless legs syndrome: Secondary | ICD-10-CM | POA: Diagnosis present

## 2015-08-03 DIAGNOSIS — J441 Chronic obstructive pulmonary disease with (acute) exacerbation: Secondary | ICD-10-CM

## 2015-08-03 DIAGNOSIS — K567 Ileus, unspecified: Secondary | ICD-10-CM | POA: Diagnosis not present

## 2015-08-03 DIAGNOSIS — R7989 Other specified abnormal findings of blood chemistry: Secondary | ICD-10-CM | POA: Diagnosis present

## 2015-08-03 DIAGNOSIS — G4733 Obstructive sleep apnea (adult) (pediatric): Secondary | ICD-10-CM | POA: Diagnosis not present

## 2015-08-03 DIAGNOSIS — R0600 Dyspnea, unspecified: Secondary | ICD-10-CM | POA: Diagnosis not present

## 2015-08-03 DIAGNOSIS — Z885 Allergy status to narcotic agent status: Secondary | ICD-10-CM

## 2015-08-03 DIAGNOSIS — M810 Age-related osteoporosis without current pathological fracture: Secondary | ICD-10-CM | POA: Diagnosis present

## 2015-08-03 DIAGNOSIS — J440 Chronic obstructive pulmonary disease with acute lower respiratory infection: Secondary | ICD-10-CM | POA: Diagnosis present

## 2015-08-03 DIAGNOSIS — K210 Gastro-esophageal reflux disease with esophagitis: Secondary | ICD-10-CM | POA: Diagnosis present

## 2015-08-03 DIAGNOSIS — Z23 Encounter for immunization: Secondary | ICD-10-CM

## 2015-08-03 DIAGNOSIS — I200 Unstable angina: Secondary | ICD-10-CM | POA: Diagnosis not present

## 2015-08-03 DIAGNOSIS — J471 Bronchiectasis with (acute) exacerbation: Secondary | ICD-10-CM | POA: Diagnosis present

## 2015-08-03 DIAGNOSIS — Z87891 Personal history of nicotine dependence: Secondary | ICD-10-CM

## 2015-08-03 DIAGNOSIS — Z888 Allergy status to other drugs, medicaments and biological substances status: Secondary | ICD-10-CM

## 2015-08-03 DIAGNOSIS — R0902 Hypoxemia: Secondary | ICD-10-CM

## 2015-08-03 DIAGNOSIS — Z882 Allergy status to sulfonamides status: Secondary | ICD-10-CM | POA: Diagnosis not present

## 2015-08-03 DIAGNOSIS — R112 Nausea with vomiting, unspecified: Secondary | ICD-10-CM | POA: Diagnosis present

## 2015-08-03 DIAGNOSIS — R55 Syncope and collapse: Secondary | ICD-10-CM | POA: Diagnosis not present

## 2015-08-03 DIAGNOSIS — I214 Non-ST elevation (NSTEMI) myocardial infarction: Secondary | ICD-10-CM | POA: Diagnosis not present

## 2015-08-03 DIAGNOSIS — T50905A Adverse effect of unspecified drugs, medicaments and biological substances, initial encounter: Secondary | ICD-10-CM | POA: Diagnosis present

## 2015-08-03 DIAGNOSIS — Z881 Allergy status to other antibiotic agents status: Secondary | ICD-10-CM | POA: Diagnosis not present

## 2015-08-03 DIAGNOSIS — M25561 Pain in right knee: Secondary | ICD-10-CM | POA: Diagnosis present

## 2015-08-03 DIAGNOSIS — R778 Other specified abnormalities of plasma proteins: Secondary | ICD-10-CM | POA: Diagnosis present

## 2015-08-03 DIAGNOSIS — K5903 Drug induced constipation: Secondary | ICD-10-CM | POA: Diagnosis present

## 2015-08-03 DIAGNOSIS — R0602 Shortness of breath: Secondary | ICD-10-CM | POA: Diagnosis not present

## 2015-08-03 DIAGNOSIS — I208 Other forms of angina pectoris: Secondary | ICD-10-CM

## 2015-08-03 DIAGNOSIS — K6389 Other specified diseases of intestine: Secondary | ICD-10-CM | POA: Diagnosis not present

## 2015-08-03 DIAGNOSIS — J209 Acute bronchitis, unspecified: Secondary | ICD-10-CM | POA: Diagnosis present

## 2015-08-03 DIAGNOSIS — M25461 Effusion, right knee: Secondary | ICD-10-CM | POA: Diagnosis not present

## 2015-08-03 DIAGNOSIS — I10 Essential (primary) hypertension: Secondary | ICD-10-CM | POA: Diagnosis not present

## 2015-08-03 DIAGNOSIS — I110 Hypertensive heart disease with heart failure: Secondary | ICD-10-CM | POA: Diagnosis not present

## 2015-08-03 LAB — TROPONIN I
TROPONIN I: 0.15 ng/mL — AB (ref <0.031)
TROPONIN I: 0.12 ng/mL — AB (ref <0.031)
Troponin I: 0.14 ng/mL — ABNORMAL HIGH (ref <0.031)
Troponin I: 0.13 ng/mL — ABNORMAL HIGH (ref <0.031)

## 2015-08-03 LAB — COMPREHENSIVE METABOLIC PANEL
ALBUMIN: 3.6 g/dL (ref 3.5–5.0)
ALK PHOS: 38 U/L (ref 38–126)
ALT: 53 U/L (ref 14–54)
AST: 69 U/L — AB (ref 15–41)
Anion gap: 12 (ref 5–15)
BILIRUBIN TOTAL: 0.7 mg/dL (ref 0.3–1.2)
BUN: 11 mg/dL (ref 6–20)
CO2: 23 mmol/L (ref 22–32)
Calcium: 8.2 mg/dL — ABNORMAL LOW (ref 8.9–10.3)
Chloride: 96 mmol/L — ABNORMAL LOW (ref 101–111)
Creatinine, Ser: 1.01 mg/dL — ABNORMAL HIGH (ref 0.44–1.00)
GFR calc Af Amer: >60 mL/min (ref >60)
GFR, EST NON AFRICAN AMERICAN: 54 mL/min — AB (ref >60)
Glucose, Bld: 93 mg/dL (ref 65–99)
Potassium: 3.5 mmol/L (ref 3.5–5.1)
Sodium: 131 mmol/L — ABNORMAL LOW (ref 135–145)
TOTAL PROTEIN: 6.7 g/dL (ref 6.5–8.1)

## 2015-08-03 LAB — CBC
HEMATOCRIT: 32.5 % — AB (ref 35.0–47.0)
HEMOGLOBIN: 11.4 g/dL — AB (ref 12.0–16.0)
MCH: 31.8 pg (ref 26.0–34.0)
MCHC: 35.1 g/dL (ref 32.0–36.0)
MCV: 90.5 fL (ref 80.0–100.0)
Platelets: 138 10*3/uL — ABNORMAL LOW (ref 150–440)
RBC: 3.59 MIL/uL — ABNORMAL LOW (ref 3.80–5.20)
RDW: 13 % (ref 11.5–14.5)
WBC: 5 10*3/uL (ref 3.6–11.0)

## 2015-08-03 LAB — URINALYSIS COMPLETE WITH MICROSCOPIC (ARMC ONLY)
BACTERIA UA: NONE SEEN
BILIRUBIN URINE: NEGATIVE
Glucose, UA: NEGATIVE mg/dL
HGB URINE DIPSTICK: NEGATIVE
KETONES UR: NEGATIVE mg/dL
LEUKOCYTES UA: NEGATIVE
NITRITE: NEGATIVE
PH: 7 (ref 5.0–8.0)
PROTEIN: NEGATIVE mg/dL
SQUAMOUS EPITHELIAL / LPF: NONE SEEN
Specific Gravity, Urine: 1.004 — ABNORMAL LOW (ref 1.005–1.030)

## 2015-08-03 LAB — HEPARIN LEVEL (UNFRACTIONATED)
HEPARIN UNFRACTIONATED: 0.46 [IU]/mL (ref 0.30–0.70)
Heparin Unfractionated: 0.62 IU/mL (ref 0.30–0.70)

## 2015-08-03 LAB — PROTIME-INR
INR: 1.07
PROTHROMBIN TIME: 14.1 s (ref 11.4–15.0)

## 2015-08-03 LAB — APTT: aPTT: 27 seconds (ref 24–36)

## 2015-08-03 MED ORDER — ALPRAZOLAM 0.5 MG PO TABS
ORAL_TABLET | ORAL | Status: AC
  Administered 2015-08-03: 0.25 mg via ORAL
  Filled 2015-08-03: qty 1

## 2015-08-03 MED ORDER — PRAMIPEXOLE DIHYDROCHLORIDE 0.25 MG PO TABS: 1.5000 mg | ORAL_TABLET | Freq: Every day | ORAL | Status: DC

## 2015-08-03 MED ORDER — ASPIRIN EC 81 MG PO TBEC
81.0000 mg | DELAYED_RELEASE_TABLET | Freq: Every day | ORAL | Status: DC
  Administered 2015-08-04 – 2015-08-07 (×4): 81 mg via ORAL
  Filled 2015-08-03 (×4): qty 1

## 2015-08-03 MED ORDER — METHYLPREDNISOLONE SODIUM SUCC 125 MG IJ SOLR
60.0000 mg | INTRAMUSCULAR | Status: DC
  Administered 2015-08-03: 60 mg via INTRAVENOUS
  Filled 2015-08-03: qty 2

## 2015-08-03 MED ORDER — ALBUTEROL SULFATE (2.5 MG/3ML) 0.083% IN NEBU
2.5000 mg | INHALATION_SOLUTION | RESPIRATORY_TRACT | Status: DC
  Administered 2015-08-03 – 2015-08-04 (×4): 2.5 mg via RESPIRATORY_TRACT
  Filled 2015-08-03 (×5): qty 3

## 2015-08-03 MED ORDER — HEPARIN SODIUM (PORCINE) 5000 UNIT/ML IJ SOLN
INTRAMUSCULAR | Status: AC
  Filled 2015-08-03: qty 1

## 2015-08-03 MED ORDER — ASPIRIN 81 MG PO CHEW
324.0000 mg | CHEWABLE_TABLET | ORAL | Status: AC
  Administered 2015-08-03: 324 mg via ORAL
  Filled 2015-08-03: qty 4

## 2015-08-03 MED ORDER — ASPIRIN 300 MG RE SUPP: 300.0000 mg | RECTAL | Status: AC

## 2015-08-03 MED ORDER — PRAMIPEXOLE DIHYDROCHLORIDE 0.25 MG PO TABS
0.5000 mg | ORAL_TABLET | Freq: Two times a day (BID) | ORAL | Status: DC | PRN
  Filled 2015-08-03: qty 2

## 2015-08-03 MED ORDER — ONDANSETRON HCL 4 MG/2ML IJ SOLN
INTRAMUSCULAR | Status: AC
  Administered 2015-08-03: 4 mg via INTRAVENOUS
  Filled 2015-08-03: qty 2

## 2015-08-03 MED ORDER — POTASSIUM CHLORIDE CRYS ER 20 MEQ PO TBCR
20.0000 meq | EXTENDED_RELEASE_TABLET | Freq: Once | ORAL | Status: AC
  Administered 2015-08-03: 20 meq via ORAL
  Filled 2015-08-03: qty 1

## 2015-08-03 MED ORDER — LEVOFLOXACIN 750 MG PO TABS
750.0000 mg | ORAL_TABLET | Freq: Every day | ORAL | Status: DC
  Administered 2015-08-03 – 2015-08-07 (×5): 750 mg via ORAL
  Filled 2015-08-03 (×5): qty 1

## 2015-08-03 MED ORDER — ONDANSETRON HCL 4 MG/2ML IJ SOLN
4.0000 mg | Freq: Once | INTRAMUSCULAR | Status: AC
  Administered 2015-08-03: 4 mg via INTRAVENOUS
  Filled 2015-08-03: qty 2

## 2015-08-03 MED ORDER — ALBUTEROL SULFATE HFA 108 (90 BASE) MCG/ACT IN AERS: 2.0000 | INHALATION_SPRAY | RESPIRATORY_TRACT | Status: DC | PRN

## 2015-08-03 MED ORDER — METOPROLOL SUCCINATE ER 50 MG PO TB24
50.0000 mg | ORAL_TABLET | Freq: Every day | ORAL | Status: DC
  Administered 2015-08-03 – 2015-08-07 (×5): 50 mg via ORAL
  Filled 2015-08-03 (×4): qty 1

## 2015-08-03 MED ORDER — TIOTROPIUM BROMIDE MONOHYDRATE 18 MCG IN CAPS
18.0000 ug | ORAL_CAPSULE | Freq: Every day | RESPIRATORY_TRACT | Status: DC
  Administered 2015-08-03 – 2015-08-06 (×3): 18 ug via RESPIRATORY_TRACT
  Filled 2015-08-03: qty 5

## 2015-08-03 MED ORDER — HEPARIN BOLUS VIA INFUSION
4000.0000 [IU] | Freq: Once | INTRAVENOUS | Status: AC
  Administered 2015-08-03: 4000 [IU] via INTRAVENOUS
  Filled 2015-08-03: qty 4000

## 2015-08-03 MED ORDER — PRAMIPEXOLE DIHYDROCHLORIDE 1 MG PO TABS
1.0000 mg | ORAL_TABLET | Freq: Every day | ORAL | Status: DC
  Administered 2015-08-03 – 2015-08-06 (×4): 1 mg via ORAL
  Filled 2015-08-03 (×3): qty 4
  Filled 2015-08-03: qty 1
  Filled 2015-08-03: qty 4

## 2015-08-03 MED ORDER — METOPROLOL SUCCINATE ER 50 MG PO TB24
ORAL_TABLET | ORAL | Status: AC
  Administered 2015-08-03: 50 mg via ORAL
  Filled 2015-08-03: qty 1

## 2015-08-03 MED ORDER — HEPARIN (PORCINE) IN NACL 100-0.45 UNIT/ML-% IJ SOLN
850.0000 [IU]/h | INTRAMUSCULAR | Status: DC
  Administered 2015-08-03 – 2015-08-04 (×2): 850 [IU]/h via INTRAVENOUS
  Filled 2015-08-03 (×2): qty 250

## 2015-08-03 MED ORDER — ASPIRIN 81 MG PO CHEW
324.0000 mg | CHEWABLE_TABLET | Freq: Once | ORAL | Status: AC
  Administered 2015-08-03: 324 mg via ORAL
  Filled 2015-08-03: qty 4

## 2015-08-03 MED ORDER — ONDANSETRON HCL 4 MG/2ML IJ SOLN
4.0000 mg | Freq: Four times a day (QID) | INTRAMUSCULAR | Status: DC | PRN
  Administered 2015-08-03 – 2015-08-05 (×4): 4 mg via INTRAVENOUS
  Filled 2015-08-03 (×3): qty 2

## 2015-08-03 MED ORDER — SODIUM CHLORIDE 0.9 % IV SOLN
INTRAVENOUS | Status: DC
  Administered 2015-08-03 (×2): via INTRAVENOUS

## 2015-08-03 MED ORDER — LORAZEPAM 2 MG/ML IJ SOLN
0.5000 mg | Freq: Once | INTRAMUSCULAR | Status: AC
  Administered 2015-08-03: 0.5 mg via INTRAVENOUS
  Filled 2015-08-03: qty 1

## 2015-08-03 MED ORDER — PANTOPRAZOLE SODIUM 40 MG PO TBEC
40.0000 mg | DELAYED_RELEASE_TABLET | Freq: Every day | ORAL | Status: DC
Start: 2015-08-03 — End: 2015-08-07
  Administered 2015-08-03 – 2015-08-07 (×5): 40 mg via ORAL
  Filled 2015-08-03 (×5): qty 1

## 2015-08-03 MED ORDER — MOMETASONE FURO-FORMOTEROL FUM 100-5 MCG/ACT IN AERO
2.0000 | INHALATION_SPRAY | Freq: Two times a day (BID) | RESPIRATORY_TRACT | Status: DC
  Administered 2015-08-03 – 2015-08-06 (×7): 2 via RESPIRATORY_TRACT
  Filled 2015-08-03: qty 8.8

## 2015-08-03 MED ORDER — SIMVASTATIN 20 MG PO TABS
20.0000 mg | ORAL_TABLET | Freq: Every day | ORAL | Status: DC
  Administered 2015-08-03 – 2015-08-07 (×5): 20 mg via ORAL
  Filled 2015-08-03 (×4): qty 1

## 2015-08-03 MED ORDER — ADULT MULTIVITAMIN W/MINERALS CH
1.0000 | ORAL_TABLET | Freq: Every day | ORAL | Status: DC
  Administered 2015-08-03 – 2015-08-07 (×5): 1 via ORAL
  Filled 2015-08-03 (×5): qty 1

## 2015-08-03 MED ORDER — GUAIFENESIN ER 600 MG PO TB12
600.0000 mg | ORAL_TABLET | Freq: Two times a day (BID) | ORAL | Status: DC
Start: 2015-08-03 — End: 2015-08-07
  Administered 2015-08-03 – 2015-08-07 (×8): 600 mg via ORAL
  Filled 2015-08-03 (×9): qty 1

## 2015-08-03 MED ORDER — ALPRAZOLAM 0.25 MG PO TABS
0.2500 mg | ORAL_TABLET | Freq: Two times a day (BID) | ORAL | Status: DC | PRN
  Administered 2015-08-03 – 2015-08-07 (×8): 0.25 mg via ORAL
  Filled 2015-08-03 (×7): qty 1

## 2015-08-03 MED ORDER — CETYLPYRIDINIUM CHLORIDE 0.05 % MT LIQD
7.0000 mL | Freq: Two times a day (BID) | OROMUCOSAL | Status: DC
  Administered 2015-08-03: 7 mL via OROMUCOSAL

## 2015-08-03 MED ORDER — GABAPENTIN 300 MG PO CAPS
ORAL_CAPSULE | ORAL | Status: AC
Start: 2015-08-03 — End: 2015-08-03
  Administered 2015-08-03: 300 mg via ORAL
  Filled 2015-08-03: qty 1

## 2015-08-03 MED ORDER — ACETAMINOPHEN 325 MG PO TABS: 650.0000 mg | ORAL_TABLET | ORAL | Status: DC | PRN

## 2015-08-03 MED ORDER — ALBUTEROL SULFATE (2.5 MG/3ML) 0.083% IN NEBU: 2.5000 mg | INHALATION_SOLUTION | RESPIRATORY_TRACT | Status: DC | PRN

## 2015-08-03 MED ORDER — ALBUTEROL SULFATE (2.5 MG/3ML) 0.083% IN NEBU
5.0000 mg | INHALATION_SOLUTION | Freq: Once | RESPIRATORY_TRACT | Status: DC
  Filled 2015-08-03: qty 6

## 2015-08-03 MED ORDER — SODIUM CHLORIDE 0.9 % IJ SOLN
3.0000 mL | INTRAMUSCULAR | Status: DC | PRN
  Administered 2015-08-03 – 2015-08-04 (×2): 3 mL via INTRAVENOUS
  Administered 2015-08-05: 10 mL via INTRAVENOUS
  Filled 2015-08-03 (×3): qty 10

## 2015-08-03 MED ORDER — COLESTIPOL HCL 1 G PO TABS
1.0000 g | ORAL_TABLET | Freq: Every day | ORAL | Status: DC
  Administered 2015-08-03 – 2015-08-05 (×3): 1 g via ORAL
  Filled 2015-08-03 (×4): qty 1

## 2015-08-03 MED ORDER — HYDROXYCHLOROQUINE SULFATE 200 MG PO TABS
200.0000 mg | ORAL_TABLET | Freq: Two times a day (BID) | ORAL | Status: DC
  Administered 2015-08-03 – 2015-08-07 (×8): 200 mg via ORAL
  Filled 2015-08-03 (×8): qty 1

## 2015-08-03 MED ORDER — HYDROCODONE-ACETAMINOPHEN 5-325 MG PO TABS
1.0000 | ORAL_TABLET | Freq: Three times a day (TID) | ORAL | Status: DC | PRN
Start: 2015-08-03 — End: 2015-08-07

## 2015-08-03 MED ORDER — NITROGLYCERIN 0.4 MG SL SUBL: 0.4000 mg | SUBLINGUAL_TABLET | SUBLINGUAL | Status: DC | PRN

## 2015-08-03 MED ORDER — GABAPENTIN 300 MG PO CAPS
300.0000 mg | ORAL_CAPSULE | Freq: Four times a day (QID) | ORAL | Status: DC
  Administered 2015-08-03 – 2015-08-07 (×17): 300 mg via ORAL
  Filled 2015-08-03 (×17): qty 1

## 2015-08-03 MED ORDER — SODIUM CHLORIDE 0.9 % IJ SOLN
3.0000 mL | Freq: Two times a day (BID) | INTRAMUSCULAR | Status: DC
  Administered 2015-08-03: 3 mL via INTRAVENOUS

## 2015-08-03 NOTE — Consult Note (Signed)
[facility] Consultation Note  Patient ID: Kaitlyn Huff, MRN: [id number], DOB/AGE: 73-Jan-1942 73 y.o. Admit date: 08/03/2015   Date of Consult: 08/03/2015 Primary Physician: [name], MD Primary Cardiologist: F ATH  Chief Complaint:  Chief Complaint  Patient presents with  . Chills  . Shortness of Breath  . Pneumonia   Reason for Consult: elevated troponin with shortness of breath  HPI: 75 y.o. female with known hypertension hyperlipidemia with valvular heart disease with moderate mitral and tricuspid regurgitation and new onset of bronchioles symptoms earlier in the week with concerns of infection. The patient became more weak and fatigue with shortness of breath and was seen in the emergency room. At that time she had a chest x-ray with parabronchial edema but no evidence of pulmonary edema or pneumonia. The patient had oxygenation which made her feel better. There is no evidence of chest discomfort throughout this whole course and her EKG showed normal sinus rhythm. She has had some elevated troponin of 0.15 but steady and unchanged throughout the last day. She has not had any significant symptoms prior to this suggesting progressive cardiovascular disease. The patient still feels somewhat weak but is improved with her shortness of breath  Past Medical History  Diagnosis Date  . Rheumatoid arthritis(714.0)     MTX transaminitis, Leflunomide (rash), enbrel, plaquinil, prednisone, remicade, Imuran (transaminitis)  . Hyperlipidemia   . Diverticulitis   . Osteoarthritis   . GERD (gastroesophageal reflux disease)   . Hypertension   . Anemia   . Positive PPD     s/p INH (2006)  . Osteoporosis     actonel  . Valvular heart disease     moderate MR and TR      Surgical History:  Past Surgical History  Procedure Laterality Date  . Abdominal hysterectomy    . Tracheostomy  1959  . Tubal ligation    . Cervical cone biopsy    . Ovary surgery    .  Cholecystectomy  06/22/14  . Colonoscopy with propofol N/A 07/09/2015    Procedure: COLONOSCOPY WITH PROPOFOL;  Surgeon: Manya Silvas, MD;  Location: [facility] ENDOSCOPY;  Service: Endoscopy;  Laterality: N/A;  . Esophagogastroduodenoscopy (egd) with propofol N/A 07/09/2015    Procedure: ESOPHAGOGASTRODUODENOSCOPY (EGD) WITH PROPOFOL;  Surgeon: Manya Silvas, MD;  Location: [facility] ENDOSCOPY;  Service: Endoscopy;  Laterality: N/A;     Home Meds: Prior to Admission medications   Medication Sig Start Date End Date Taking? Authorizing Provider  ADVAIR DISKUS 250-50 MCG/DOSE AEPB USE 1 INHALATION INTO THE LUNGS EVERY 12 HOURS 05/19/15  Yes [name], MD  albuterol (PROVENTIL HFA;VENTOLIN HFA) 108 (90 Base) MCG/ACT inhaler Inhale 2 puffs into the lungs every 4 (four) hours as needed for wheezing or shortness of breath. 07/30/15  Yes Leone Haven, MD  ALPRAZolam [name]) 0.25 MG tablet 1 tablet q day prn Patient taking differently: Take 0.25 mg by mouth daily as needed. 1 tablet q day prn 07/10/15  Yes Coral Spikes, DO  aspirin 81 MG tablet Take 81 mg by mouth daily.   Yes Historical Provider, MD  calcium-vitamin D (OSCAL 500/200 D-3) 500-200 MG-UNIT per tablet Take 1 tablet by mouth 2 (two) times daily.   Yes Historical Provider, MD  cetirizine (ZYRTEC) 10 MG tablet TAKE 1 TABLET DAILY 12/01/14  Yes [name], MD  colestipol (COLESTID) 1 G tablet Take 2 g by mouth 2 (two) times daily.  04/09/15 04/08/16 Yes Historical Provider, MD  FeFum-FePo-FA-B Cmp-C-Zn-Mn-Cu (  TANDEM PLUS) 162-115.2-1 MG CAPS TAKE 1 CAPSULE DAILY 07/10/15  Yes [name], MD  furosemide (LASIX) 20 MG tablet TAKE 1 TABLET DAILY 03/17/15  Yes [name], MD  gabapentin (NEURONTIN) 300 MG capsule Take 300 mg by mouth 4 (four) times daily.   Yes Historical Provider, MD  hydroxychloroquine (PLAQUENIL) 200 MG tablet Take 1 tablet (200 mg total) by mouth 2 (two) times daily. 05/13/13  Yes [name], MD  InFLIXimab  (REMICADE IV) Inject every 6 weeks   Yes Historical Provider, MD  levofloxacin (LEVAQUIN) 750 MG tablet Take 1 tablet (750 mg total) by mouth daily. 07/30/15  Yes Leone Haven, MD  metoprolol succinate (TOPROL-XL) 50 MG 24 hr tablet TAKE 1 TABLET (50 MG TOTAL) DAILY. TAKE WITH OR IMMEDIATELY FOLLOWING A MEAL 12/31/14  Yes [name], MD  Multiple Vitamin (MULTIVITAMIN) capsule Take 1 capsule by mouth daily.   Yes Historical Provider, MD  NEXIUM 40 MG capsule TAKE 1 CAPSULE TWICE A DAY BEFORE A MEAL 07/03/15  Yes [name], MD  potassium chloride (MICRO-K) 10 MEQ CR capsule TAKE 2 CAPSULES TWICE A DAY 06/15/15  Yes [name], MD  predniSONE (DELTASONE) 5 MG tablet Take 5 mg by mouth 2 (two) times daily with a meal.   Yes Historical Provider, MD  simvastatin (ZOCOR) 20 MG tablet TAKE 1 TABLET DAILY 05/28/15  Yes [name], MD  sodium chloride (OCEAN) 0.65 % nasal spray Place 1 spray into the nose as needed for congestion.   Yes Historical Provider, MD  traMADol (ULTRAM) 50 MG tablet Take 1 tablet (50 mg total) by mouth at bedtime as needed. Patient taking differently: Take 50 mg by mouth 2 (two) times daily.  09/01/14  Yes Rubbie Battiest, NP    Inpatient Medications:  . albuterol  2.5 mg Nebulization Q4H  . [START ON 08/04/2015] aspirin EC  81 mg Oral Daily  . colestipol  1 g Oral Daily  . gabapentin  300 mg Oral QID  . guaiFENesin  600 mg Oral BID  . heparin      . hydroxychloroquine  200 mg Oral BID  . levofloxacin  750 mg Oral Daily  . methylPREDNISolone (SOLU-MEDROL) injection  60 mg Intravenous Q24H  . metoprolol succinate  50 mg Oral Daily  . mometasone-formoterol  2 puff Inhalation BID  . multivitamin with minerals  1 tablet Oral Daily  . pantoprazole  40 mg Oral Daily  . pramipexole  1 mg Oral QHS  . simvastatin  20 mg Oral Daily  . tiotropium  18 mcg Inhalation Daily   . sodium chloride 75 mL/hr at 08/03/15 1021  . heparin 850 Units/hr (08/03/15 [id number])     Allergies:  Allergies  Allergen Reactions  . Astelin [Azelastine Hcl]   . Ciprofloxacin Other (See Comments)    GI upset  . Codeine Other (See Comments)    Altered mental status  . Dilaudid [Hydromorphone]   . Flexeril [Cyclobenzaprine]   . Imuran [Azathioprine] Other (See Comments)    Abnormal liver function  . Keflex [Cephalexin] Other (See Comments)    GI upset  . Lisinopril Itching  . Lyrica [Pregabalin] Other (See Comments)    Sore gums  . Methotrexate Derivatives Other (See Comments)    Abnormal liver function  . Amoxicillin Rash  . Arava [Leflunomide] Rash  . Clindamycin/Lincomycin Rash  . Doxycycline Rash  . Lodine [Etodolac] Rash  . Percocet [Oxycodone-Acetaminophen] Rash  . Sulfa Antibiotics Rash    Social History  Social History  . Marital Status: Widowed    Spouse Name: N/A  . Number of Children: 4  . Years of Education: N/A   Occupational History  . retired    Social History Main Topics  . Smoking status: Never Smoker   . Smokeless tobacco: Never Used  . Alcohol Use: No  . Drug Use: No  . Sexual Activity: Not on file   Other Topics Concern  . Not on file   Social History Narrative   Lives with family at home     Family History  Problem Relation Age of Onset  . Heart disease Father     MI  . Heart disease Mother   . Valvular heart disease Mother   . Colon cancer Neg Hx   . Breast cancer Sister 67     Review of Systems Positive for shortness of breath cough and congestion Negative for: General:  chills, fever, night sweats or weight changes.  Cardiovascular: PND orthopnea syncope dizziness  Dermatological skin lesions rashes Respiratory: Positive for Cough congestion Urologic: Frequent urination urination at night and hematuria Abdominal: negative for nausea, vomiting, diarrhea, bright red blood per rectum, melena, or hematemesis Neurologic: negative for visual changes, and/or hearing changes  All other systems reviewed and  are otherwise negative except as noted above.  Labs:  Recent Labs  08/03/15 0421 08/03/15 1059 08/03/15 1523  TROPONINI 0.15* 0.14* 0.13*   Lab Results  Component Value Date   WBC 5.0 08/03/2015   HGB 11.4* 08/03/2015   HCT 32.5* 08/03/2015   MCV 90.5 08/03/2015   PLT 138* 08/03/2015    Recent Labs Lab 08/03/15 0421  NA 131*  K 3.5  CL 96*  CO2 23  BUN 11  CREATININE 1.01*  CALCIUM 8.2*  PROT 6.7  BILITOT 0.7  ALKPHOS 38  ALT 53  AST 69*  GLUCOSE 93   Lab Results  Component Value Date   CHOL 128 04/13/2015   HDL 58.30 04/13/2015   LDLCALC 46 04/13/2015   TRIG 119.0 04/13/2015   No results found for: DDIMER  Radiology/Studies:  Dg Chest 2 View  08/03/2015  CLINICAL DATA:  Acute onset of shortness of breath, dry cough, generalized weakness and dizziness. Initial encounter. EXAM: CHEST  2 VIEW COMPARISON:  Chest radiograph performed 07/30/2015 FINDINGS: The lungs are well-aerated. Peribronchial thickening is noted. Mild vascular congestion is seen. There is no evidence of focal opacification, pleural effusion or pneumothorax. The heart is borderline normal in size. Right paratracheal prominence is stable and likely reflects normal vasculature. No acute osseous abnormalities are seen. Clips are noted within the right upper quadrant, reflecting prior cholecystectomy. IMPRESSION: Peribronchial thickening noted.  Mild vascular congestion seen. Electronically Signed   By: Garald Balding M.D.   On: 08/03/2015 04:41   Dg Chest 2 View  07/30/2015  CLINICAL DATA:  Cough and shortness of breath for 2 days. EXAM: CHEST  2 VIEW COMPARISON:  January 14, 2015 FINDINGS: The mediastinal contour is normal. Heart size is mildly enlarged. Both lungs are clear. The visualized skeletal structures are stable. IMPRESSION: No active cardiopulmonary disease. Electronically Signed   By: Abelardo Diesel M.D.   On: 07/30/2015 16:19    EKG: Normal sinus rhythm. Otherwise normal EKG  Weights: Filed  Weights   08/03/15 0331  Weight: 169 lb (76.658 kg)     Physical Exam: Blood pressure 136/69, pulse 63, temperature 98.4 F (36.9 C), temperature source Oral, resp. rate 18, height 5\' 4"  (1.626 m), weight  169 lb (76.658 kg), SpO2 98 %. Body mass index is 28.99 kg/(m^2). General: Well developed, well nourished, in no acute distress. Head eyes ears nose throat: Normocephalic, atraumatic, sclera non-icteric, no xanthomas, nares are without discharge. No apparent thyromegaly and/or mass  Lungs: Normal respiratory effort.  Few wheezes, no rales, some rhonchi.  Heart: RRR with normal S1 S2. no murmur gallop, no rub, PMI is normal size and placement, carotid upstroke normal without bruit, jugular venous pressure is normal Abdomen: Soft, non-tender, non-distended with normoactive bowel sounds. No hepatomegaly. No rebound/guarding. No obvious abdominal masses. Abdominal aorta is normal size without bruit Extremities: Trace edema. no cyanosis, no clubbing, no ulcers  Peripheral : 2+ bilateral upper extremity pulses, 2+ bilateral femoral pulses, 2+ bilateral dorsal pedal pulse Neuro: Alert and oriented. No facial asymmetry. No focal deficit. Moves all extremities spontaneously. Musculoskeletal: Normal muscle tone without kyphosis Psych:  Responds to questions appropriately with a normal affect.    Assessment: 75 year old female with valvular heart disease hypertension hyperlipidemia with progressive shortness of breath cough and congestion and some elevation of troponin consistent with demand ischemia at this time and no overt concerns of non-ST elevation myocardial infarction  Plan: 1. Continue serial ECG and enzymes to assess for possible myocardial infarction 2. Echocardiogram for LV systolic dysfunction valvular heart disease causing symptoms of above 3. Oxygenation as well as treatment of possible infection 4. Hypertension control with possible beta blocker for further risk reduction of  cardiovascular disease and treatment of valvular heart disease 5. Aspirin and heparin for 24 more hours for further evaluation of possible need for treatment options including the possibility of cardiac catheterization depending on how symptoms improved and/or further treatment including a Lexiscan infusion stress test  Signed, Corey Skains M.D. [facility] 08/03/2015, 5:33 PM

## 2015-08-03 NOTE — Progress Notes (Signed)
ANTICOAGULATION CONSULT NOTE - Follow Up Consult  Pharmacy Consult for Heparin drip Indication: chest pain/ACS  Allergies  Allergen Reactions  . Astelin [Azelastine Hcl]   . Ciprofloxacin Other (See Comments)    GI upset  . Codeine Other (See Comments)    Altered mental status  . Dilaudid [Hydromorphone]   . Flexeril [Cyclobenzaprine]   . Imuran [Azathioprine] Other (See Comments)    Abnormal liver function  . Keflex [Cephalexin] Other (See Comments)    GI upset  . Lisinopril Itching  . Lyrica [Pregabalin] Other (See Comments)    Sore gums  . Methotrexate Derivatives Other (See Comments)    Abnormal liver function  . Amoxicillin Rash  . Arava [Leflunomide] Rash  . Clindamycin/Lincomycin Rash  . Doxycycline Rash  . Lodine [Etodolac] Rash  . Percocet [Oxycodone-Acetaminophen] Rash  . Sulfa Antibiotics Rash    Patient Measurements: Height: 5\' 4"  (162.6 cm) Weight: 169 lb (76.658 kg) IBW/kg (Calculated) : 54.7 Heparin Dosing Weight: 70.9 kg  Vital Signs: Temp: 98.4 F (36.9 C) (01/23 1213) Temp Source: Oral (01/23 1213) BP: 136/69 mmHg (01/23 1213) Pulse Rate: 63 (01/23 1213)  Labs:  Recent Labs  08/03/15 0421 08/03/15 0425 08/03/15 1059 08/03/15 1523  HGB 11.4*  --   --   --   HCT 32.5*  --   --   --   PLT 138*  --   --   --   APTT  --  27  --   --   LABPROT  --  14.1  --   --   INR  --  1.07  --   --   HEPARINUNFRC  --   --   --  0.46  CREATININE 1.01*  --   --   --   TROPONINI 0.15*  --  0.14* 0.13*    Estimated Creatinine Clearance: 50.5 mL/min (by C-G formula based on Cr of 1.01).   Medications:  Infusions:  . sodium chloride 75 mL/hr at 08/03/15 1021  . heparin 850 Units/hr (08/03/15 [id number])    Assessment: Patient currently ordered Heparin drip at 850 units/hr for NSTEMI.  1/23 1500 HL resulted at 0.46  Goal of Therapy:  Heparin level 0.3-0.7 units/ml Monitor platelets by anticoagulation protocol: Yes   Plan:  Will continue at current  rate and check a confirmatory level at 2200.  Paulina Fusi, PharmD, BCPS 08/03/2015 5:10 PM

## 2015-08-03 NOTE — Progress Notes (Signed)
*  PRELIMINARY RESULTS* Echocardiogram 2D Echocardiogram has been performed.  Kaitlyn Huff Stills 08/03/2015, 7:10 PM

## 2015-08-03 NOTE — ED Notes (Signed)
Patient transported to X-ray 

## 2015-08-03 NOTE — Care Management Note (Signed)
Case Management Note  Patient Details  Name: TATEANNA FAREWELL MRN: [id number] Date of Birth: March 23, 1941  Subjective/Objective:        75yo Mrs Cherylin Timmermans was admitted 08/03/15 after failing outpatient treatment on PO Levoquin for PNA. Presented to the ED with shortness of breath and elevated tropinins. Hx rheumatoid arthritis. Resides alone but daughter Horris Latino is supportive. PCP=Dr [employer]. Pharmacy= [facility] on [address] in [place]. No home assistive equipment and no home health services. No home oxygen. Drives self or daughter drives her to appointments. Provided Mrs Smilowitz with a list of home health providers. Anticipate home with home health. Case management will follow for discharge planning.             Action/Plan:   Expected Discharge Date:                  Expected Discharge Plan:     In-House Referral:     Discharge planning Services     Post Acute Care Choice:    Choice offered to:     DME Arranged:    DME Agency:     HH Arranged:    [place] Agency:     Status of Service:     Medicare Important Message Given:    Date Medicare IM Given:    Medicare IM give by:    Date Additional Medicare IM Given:    Additional Medicare Important Message give by:     If discussed at [place] of Stay Meetings, dates discussed:    Additional Comments:  Aleera Gilcrease A, RN 08/03/2015, 3:12 PM

## 2015-08-03 NOTE — ED Notes (Signed)
Heparin bolus and drip dose confirmed by April brumgard, rn.

## 2015-08-03 NOTE — Progress Notes (Signed)
[facility] [place] NAME: Kaitlyn Huff    MR#:  [id number]  DATE OF BIRTH:  1941-04-01  SUBJECTIVE:  CHIEF COMPLAINT:   Chief Complaint  Patient presents with  . Chills  . Shortness of Breath  . Pneumonia   patient is 75 year old female with past medical history significant for history of remote as a fasciitis, hyperlipidemia, osteoarthritis, hypertension, valvular heart disease who presents to the hospital with complaints of nausea, vomiting and shortness of breath. Patient was initiated on levofloxacin. 4 days ago prior by primary care physician for pneumonia, however, did not improve and presented to the hospital with shortness of breath. She had no chest pains. EKG was remarkable for no significant ST-T changes. Patient's troponin was found to be markedly elevated at 0.15 on the first set declining afterwards. Chest x-ray showed peribronchial thickening, mild vascular congestion. Patient was initiated on antibiotics intravenously and admitted to the hospital for further evaluation and treatment. She feels satisfactory today, however, weak and denies any improvement, she is on oxygen through nasal cannulas at 2 L of oxygen with satisfactory O2 sats. Nausea and vomiting resolved and she 800% of offered meals.  Patient admits of cough and shortness of breath as well as wheezing. Denies any smoking history, but admits of living with person who smoked for 40 years.  Review of Systems  Constitutional: Negative for fever, chills and weight loss.  HENT: Negative for congestion.   Eyes: Negative for blurred vision and double vision.  Respiratory: Positive for cough, shortness of breath and wheezing. Negative for sputum production.   Cardiovascular: Negative for chest pain, palpitations, orthopnea, leg swelling and PND.  Gastrointestinal: Negative for nausea, vomiting, abdominal pain, diarrhea, constipation and blood in stool.  Genitourinary:  Negative for dysuria, urgency, frequency and hematuria.  Musculoskeletal: Negative for falls.  Neurological: Negative for dizziness, tremors, focal weakness and headaches.  Endo/Heme/Allergies: Does not bruise/bleed easily.  Psychiatric/Behavioral: Negative for depression. The patient does not have insomnia.     VITAL SIGNS: Blood pressure 136/69, pulse 63, temperature 98.4 F (36.9 C), temperature source Oral, resp. rate 18, height 5\' 4"  (1.626 m), weight 76.658 kg (169 lb), SpO2 98 %.  PHYSICAL EXAMINATION:   GENERAL:  75 y.o.-year-old patient lying in the bed with no acute distress.  EYES: Pupils equal, round, reactive to light and accommodation. No scleral icterus. Extraocular muscles intact.  HEENT: Head atraumatic, normocephalic. Oropharynx and nasopharynx clear.  NECK:  Supple, no jugular venous distention. No thyroid enlargement, no tenderness.  LUNGS: Diminished breath sounds bilaterally, scattered bilateral, mostly on the right side wheezing, rales,rhonchi , but no crepitations. Intermittently using accessory muscles of respiration, but able to speak short sentences.  CARDIOVASCULAR: S1, S2 normal. No murmurs, rubs, or gallops.  ABDOMEN: Soft, nontender, nondistended. Bowel sounds present. No organomegaly or mass.  EXTREMITIES: No pedal edema, cyanosis, or clubbing. Mild ankle edema, mostly on the right side NEUROLOGIC: Cranial nerves II through XII are intact. Muscle strength 5/5 in all extremities. Sensation intact. Gait not checked.  PSYCHIATRIC: The patient is alert and oriented x 3.  SKIN: No obvious rash, lesion, or ulcer.   ORDERS/RESULTS REVIEWED:   CBC  Recent Labs Lab 08/03/15 0421  WBC 5.0  HGB 11.4*  HCT 32.5*  PLT 138*  MCV 90.5  MCH 31.8  MCHC 35.1  RDW 13.0   ------------------------------------------------------------------------------------------------------------------  Chemistries   Recent Labs Lab 08/03/15 0421  NA 131*  K 3.5  CL 96*  CO2 23  GLUCOSE 93  BUN 11  CREATININE 1.01*  CALCIUM 8.2*  AST 69*  ALT 53  ALKPHOS 38  BILITOT 0.7   ------------------------------------------------------------------------------------------------------------------ estimated creatinine clearance is 50.5 mL/min (by C-G formula based on Cr of 1.01). ------------------------------------------------------------------------------------------------------------------ No results for input(s): TSH, T4TOTAL, T3FREE, THYROIDAB in the last 72 hours.  Invalid input(s): FREET3  Cardiac Enzymes  Recent Labs Lab 08/03/15 0421 08/03/15 1059 08/03/15 1523  TROPONINI 0.15* 0.14* 0.13*   ------------------------------------------------------------------------------------------------------------------ Invalid input(s): POCBNP ---------------------------------------------------------------------------------------------------------------  RADIOLOGY: Dg Chest 2 View  08/03/2015  CLINICAL DATA:  Acute onset of shortness of breath, dry cough, generalized weakness and dizziness. Initial encounter. EXAM: CHEST  2 VIEW COMPARISON:  Chest radiograph performed 07/30/2015 FINDINGS: The lungs are well-aerated. Peribronchial thickening is noted. Mild vascular congestion is seen. There is no evidence of focal opacification, pleural effusion or pneumothorax. The heart is borderline normal in size. Right paratracheal prominence is stable and likely reflects normal vasculature. No acute osseous abnormalities are seen. Clips are noted within the right upper quadrant, reflecting prior cholecystectomy. IMPRESSION: Peribronchial thickening noted.  Mild vascular congestion seen. Electronically Signed   By: Garald Balding M.D.   On: 08/03/2015 04:41    EKG:  Orders placed or performed during the hospital encounter of 08/03/15  . EKG 12-Lead  . EKG 12-Lead  . ED EKG  . ED EKG    ASSESSMENT AND PLAN:  Principal Problem:   Non-STEMI (non-ST elevated myocardial  infarction) (HCC) Active Problems:   Nausea & vomiting 1. COPD exacerbation, initiate patient on steroids, inhalation therapy was with budesonide, albuterol and tiotropium, following clinically. Continue oxygen therapy and wean off oxygen as tolerated 2. Acute bronchitis, get sputum cultures if possible, start patient on Humibid, continue patient on levofloxacin 3. Elevated troponin, likely demand ischemia, getting echocardiogram as well as cardiology consultation for further recommendations , Continue patient on heparin intravenously, aspirin, metoprolol. Dr. Nehemiah Massed will determine if cardiac catheterization is needed during this admission 4. Hyponatremia, check sodium level in the morning, patient is on normal saline infusion at present 5. Renal insufficiency, followed with IV fluid administration, get urinalysis 6. Anemia. Get Hemoccult, continue heparin as well as aspirin 7. Thrombocytopenia, followed with heparin infusion   Management plans discussed with the patient, family and they are in agreement.   DRUG ALLERGIES:  Allergies  Allergen Reactions  . Astelin [Azelastine Hcl]   . Ciprofloxacin Other (See Comments)    GI upset  . Codeine Other (See Comments)    Altered mental status  . Dilaudid [Hydromorphone]   . Flexeril [Cyclobenzaprine]   . Imuran [Azathioprine] Other (See Comments)    Abnormal liver function  . Keflex [Cephalexin] Other (See Comments)    GI upset  . Lisinopril Itching  . Lyrica [Pregabalin] Other (See Comments)    Sore gums  . Methotrexate Derivatives Other (See Comments)    Abnormal liver function  . Amoxicillin Rash  . Arava [Leflunomide] Rash  . Clindamycin/Lincomycin Rash  . Doxycycline Rash  . Lodine [Etodolac] Rash  . Percocet [Oxycodone-Acetaminophen] Rash  . Sulfa Antibiotics Rash    CODE STATUS:     Code Status Orders        Start     Ordered   08/03/15 0950  Full code   Continuous     08/03/15 0949    Code Status History     Date Active Date Inactive Code Status Order ID Comments User Context   This patient has a current code status  but no historical code status.      TOTAL TIME TAKING CARE OF THIS PATIENT: 40  minutes.  Prolonged discussion with patient as well as to family members about her treatment plan and discharge planning, all questions were answered. Time spent approximately 15 minutes. On discussions  Holton Sidman M.D on 08/03/2015 at 6:03 PM  Between 7am to 6pm - Pager - [phone]  After 6pm go to [email or web address] - password [email or web address] [facility]  Office  [phone]  CC: Primary care physician; [name], MD

## 2015-08-03 NOTE — Progress Notes (Signed)
ANTICOAGULATION CONSULT NOTE - Follow Up Consult  Pharmacy Consult for Heparin drip Indication: chest pain/ACS  Allergies  Allergen Reactions  . Astelin [Azelastine Hcl]   . Ciprofloxacin Other (See Comments)    GI upset  . Codeine Other (See Comments)    Altered mental status  . Dilaudid [Hydromorphone]   . Flexeril [Cyclobenzaprine]   . Imuran [Azathioprine] Other (See Comments)    Abnormal liver function  . Keflex [Cephalexin] Other (See Comments)    GI upset  . Lisinopril Itching  . Lyrica [Pregabalin] Other (See Comments)    Sore gums  . Methotrexate Derivatives Other (See Comments)    Abnormal liver function  . Amoxicillin Rash  . Arava [Leflunomide] Rash  . Clindamycin/Lincomycin Rash  . Doxycycline Rash  . Lodine [Etodolac] Rash  . Percocet [Oxycodone-Acetaminophen] Rash  . Sulfa Antibiotics Rash    Patient Measurements: Height: 5\' 4"  (162.6 cm) Weight: 169 lb (76.658 kg) IBW/kg (Calculated) : 54.7 Heparin Dosing Weight: 70.9 kg  Vital Signs: Temp: 97.4 F (36.3 C) (01/23 1948) Temp Source: Oral (01/23 1948) BP: 119/66 mmHg (01/23 1948) Pulse Rate: 70 (01/23 1948)  Labs:  Recent Labs  08/03/15 0421 08/03/15 0425 08/03/15 1059 08/03/15 1523 08/03/15 2136  HGB 11.4*  --   --   --   --   HCT 32.5*  --   --   --   --   PLT 138*  --   --   --   --   APTT  --  27  --   --   --   LABPROT  --  14.1  --   --   --   INR  --  1.07  --   --   --   HEPARINUNFRC  --   --   --  0.46 0.62  CREATININE 1.01*  --   --   --   --   TROPONINI 0.15*  --  0.14* 0.13* 0.12*    Estimated Creatinine Clearance: 50.5 mL/min (by C-G formula based on Cr of 1.01).   Medications:  Infusions:  . sodium chloride 75 mL/hr at 08/03/15 2204  . heparin 850 Units/hr (08/03/15 1900)    Assessment: Patient currently ordered Heparin drip at 850 units/hr for NSTEMI.  1/23 1500 HL resulted at 0.46  Goal of Therapy:  Heparin level 0.3-0.7 units/ml Monitor platelets by  anticoagulation protocol: Yes   Plan:  Will continue at current rate and check a confirmatory level at 2200.  1/23 2200 heparin level 0.62. Recheck with CBC tomorrow AM.  Sim Boast, PharmD, BCPS 08/03/2015 10:34 PM

## 2015-08-03 NOTE — ED Provider Notes (Signed)
[facility] Emergency Department Provider Note  ____________________________________________  Time seen: Approximately 4:02 AM  I have reviewed the triage vital signs and the nursing notes.   HISTORY  Chief Complaint Chills; Shortness of Breath; and Pneumonia    HPI Kaitlyn Huff is a 75 y.o. female who comes into the hospital today with shortness of breath. The patient woke up her family with a complaint of being cold clammy and shaking. The patient reports that she was feeling weak and having pain across her back. Before going to bed the patient reports that she had been feeling weak and jittery as well and has had a cough. The patient was diagnosed with pneumonia on 119 and has been placed on Levaquin. She reports that she's felt strange and could not explain exactly what she was feeling. The patient has not had a fever today but has been on and off feeling clammy with nausea and no vomiting. The patient has a slight headache and did have some diarrhea this morning. When the patient woke her family up tonight she decided to come in for further evaluation given the continued symptoms although she has been taking her medication.   Past Medical History  Diagnosis Date  . Rheumatoid arthritis(714.0)     MTX transaminitis, Leflunomide (rash), enbrel, plaquinil, prednisone, remicade, Imuran (transaminitis)  . Hyperlipidemia   . Diverticulitis   . Osteoarthritis   . GERD (gastroesophageal reflux disease)   . Hypertension   . Anemia   . Positive PPD     s/p INH (2006)  . Osteoporosis     actonel  . 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 Non-STEMI (non-ST elevated myocardial infarction) ([place]) 08/03/2015  . Nausea & vomiting 08/03/2015  . Cough 07/30/2015  . History of colonic polyps 07/10/2015  . Swelling, cheek 01/11/2015  . Health care maintenance 01/11/2015  . Pneumonia 11/09/2014  .  Right hip pain 09/02/2014  . Diarrhea 09/02/2014  . Hospital discharge follow-up 09/02/2014  . Itching 08/04/2013  . Stress 08/04/2013  . Weight loss 08/04/2013  . Abnormal liver function test 03/26/2013  . Obstructive sleep apnea 12/23/2012  . Dilated bile duct 12/23/2012  . Hypercholesterolemia 08/12/2012  . Rheumatoid arthritis ([place]) 08/12/2012  . GERD (gastroesophageal reflux disease) 08/12/2012  . Hypertension 08/12/2012  . Anemia 08/12/2012    Past Surgical History  Procedure Laterality Date  . Abdominal hysterectomy    . Tracheostomy  1959  . Tubal ligation    . Cervical cone biopsy    . [place] surgery    . Cholecystectomy  06/22/14  . Colonoscopy with propofol N/A 07/09/2015    Procedure: COLONOSCOPY WITH PROPOFOL;  Surgeon: Manya Silvas, MD;  Location: [facility] ENDOSCOPY;  Service: Endoscopy;  Laterality: N/A;  . Esophagogastroduodenoscopy (egd) with propofol N/A 07/09/2015    Procedure: ESOPHAGOGASTRODUODENOSCOPY (EGD) WITH PROPOFOL;  Surgeon: Manya Silvas, MD;  Location: [facility] ENDOSCOPY;  Service: Endoscopy;  Laterality: N/A;    Current Outpatient Rx  Name  Route  Sig  Dispense  Refill  . ADVAIR DISKUS 250-50 MCG/DOSE AEPB      USE 1 INHALATION INTO THE LUNGS EVERY 12 HOURS   180 each   0   . albuterol (PROVENTIL HFA;VENTOLIN HFA) 108 (90 Base) MCG/ACT inhaler   Inhalation   Inhale 2 puffs into the lungs every 4 (four) hours as needed for wheezing or shortness of  breath.   1 Inhaler   2   . alendronate (FOSAMAX) 70 MG tablet   Oral   Take 70 mg by mouth once a week. Take with a full glass of water on an empty stomach.         . ALPRAZolam (XANAX) 0.25 MG tablet      1 tablet q day prn   20 tablet   0   . aspirin 81 MG tablet   Oral   Take 81 mg by mouth daily.         . calcium-vitamin D (OSCAL 500/200 D-3) 500-200 MG-UNIT per tablet   Oral   Take 1 tablet by mouth 2 (two) times daily.         . cetirizine (ZYRTEC) 10 MG tablet       TAKE 1 TABLET DAILY   90 tablet   3   . colestipol (COLESTID) 1 G tablet   Oral   Take by mouth.         . DPH-Lido-AlHydr-MgHydr-Simeth (FIRST-MOUTHWASH BLM) SUSP      take 5 to 10 milliliters by mouth SWISH AND SPIT three times a day   237 mL   0   . FeFum-FePo-FA-B Cmp-C-Zn-Mn-Cu (TANDEM PLUS) 162-115.2-1 MG CAPS      TAKE 1 CAPSULE DAILY   90 each   2   . furosemide (LASIX) 20 MG tablet      TAKE 1 TABLET DAILY   90 tablet   1   . gabapentin (NEURONTIN) 300 MG capsule   Oral   Take 300 mg by mouth 4 (four) times daily.         Marland Kitchen HYDROcodone-acetaminophen (NORCO/VICODIN) 5-325 MG tablet   Oral   Take 1 tablet by mouth every 8 (eight) hours as needed for moderate pain or severe pain.   5 tablet   0   . hydroxychloroquine (PLAQUENIL) 200 MG tablet   Oral   Take 1 tablet (200 mg total) by mouth 2 (two) times daily.   180 tablet   1   . InFLIXimab (REMICADE IV)      Inject every 6 weeks         . levofloxacin (LEVAQUIN) 750 MG tablet   Oral   Take 1 tablet (750 mg total) by mouth daily.   7 tablet   0   . metoprolol succinate (TOPROL-XL) 50 MG 24 hr tablet      TAKE 1 TABLET (50 MG TOTAL) DAILY. TAKE WITH OR IMMEDIATELY FOLLOWING A MEAL   90 tablet   2   . Multiple Vitamin (MULTIVITAMIN) capsule   Oral   Take 1 capsule by mouth daily.         Marland Kitchen NEXIUM 40 MG capsule      TAKE 1 CAPSULE TWICE A DAY BEFORE A MEAL   180 capsule   2   . potassium chloride (K-DUR) 10 MEQ tablet   Oral   Take 10 mEq by mouth once.          . potassium chloride (MICRO-K) 10 MEQ CR capsule      TAKE 2 CAPSULES TWICE A DAY   360 capsule   3   . predniSONE (DELTASONE) 10 MG tablet      40mg  daily then 30mg  daily then 20 mg daily then 10 mg daily   10 tablet   0   . simvastatin (ZOCOR) 20 MG tablet      TAKE 1 TABLET DAILY  90 tablet   0   . sodium chloride (OCEAN) 0.65 % nasal spray   Nasal   Place 1 spray into the nose as needed for  congestion.         . traMADol (ULTRAM) 50 MG tablet   Oral   Take 1 tablet (50 mg total) by mouth at bedtime as needed.   30 tablet   0     Allergies Astelin; Ciprofloxacin; Codeine; Dilaudid; Flexeril; Imuran; Keflex; Lisinopril; Lyrica; Methotrexate derivatives; Amoxicillin; Arava; Clindamycin/lincomycin; Doxycycline; Lodine; Percocet; and Sulfa antibiotics  Family History  Problem Relation Age of Onset  . Heart disease Father     MI  . Heart disease Mother   . Valvular heart disease Mother   . Colon cancer Neg Hx   . Breast cancer Sister 72    Social History Social History  Substance Use Topics  . Smoking status: Never Smoker   . Smokeless tobacco: Never Used  . Alcohol Use: No    Review of Systems Constitutional: No fever/chills Eyes: No visual changes. ENT: No sore throat. Cardiovascular: Denies chest pain. Respiratory:  shortness of breath. Gastrointestinal: Nausea with no abdominal pain Genitourinary: Negative for dysuria. Musculoskeletal: back pain. Skin: Negative for rash. Neurological: Mild headache  10-point ROS otherwise negative.  ____________________________________________   PHYSICAL EXAM:  VITAL SIGNS: ED Triage Vitals  Enc Vitals Group     BP 08/03/15 0331 149/76 mmHg     Pulse Rate 08/03/15 0331 66     Resp 08/03/15 0331 24     Temp 08/03/15 0331 97.9 F (36.6 C)     Temp Source 08/03/15 0331 Oral     SpO2 08/03/15 0331 96 %     Weight 08/03/15 0331 169 lb (76.658 kg)     Height 08/03/15 0331 5\' 4"  (1.626 m)     Head Cir --      Peak Flow --      Pain Score 08/03/15 0333 5     Pain Loc --      Pain Edu? --      Excl. in [place]? --     Constitutional: Alert and oriented. Well appearing and in moderate distress. Eyes: Conjunctivae are normal. PERRL. EOMI. Head: Atraumatic. Nose: No congestion/rhinnorhea. Mouth/Throat: Mucous membranes are moist.  Oropharynx non-erythematous. Cardiovascular: Normal rate, regular rhythm. Grossly  normal heart sounds.  Good peripheral circulation. Respiratory: Normal respiratory effort.  No retractions. Lungs CTAB. Gastrointestinal: Soft and nontender. No distention positive bowel sounds Musculoskeletal: No lower extremity tenderness nor edema.  . Neurologic:  Normal speech and language.  Skin:  Skin is warm, dry and intact. Psychiatric: Mood and affect are normal.   ____________________________________________   LABS (all labs ordered are listed, but only abnormal results are displayed)  Labs Reviewed  CBC - Abnormal; Notable for the following:    RBC 3.59 (*)    Hemoglobin 11.4 (*)    HCT 32.5 (*)    Platelets 138 (*)    All other components within normal limits  COMPREHENSIVE METABOLIC PANEL - Abnormal; Notable for the following:    Sodium 131 (*)    Chloride 96 (*)    Creatinine, Ser 1.01 (*)    Calcium 8.2 (*)    AST 69 (*)    GFR calc non Af Amer 54 (*)    All other components within normal limits  TROPONIN I - Abnormal; Notable for the following:    Troponin I 0.15 (*)    All other components within  normal limits  CULTURE, BLOOD (ROUTINE X 2)  CULTURE, BLOOD (ROUTINE X 2)  APTT  PROTIME-INR  HEPARIN LEVEL (UNFRACTIONATED)   ____________________________________________  EKG  ED ECG REPORT I, Loney Hering, the attending physician, personally viewed and interpreted this ECG.   Date: 08/03/2015  EKG Time: 336  Rate: 67  Rhythm: normal sinus rhythm  Axis: normal  Intervals:none  ST&T Change: diffuse t wave flattening  ____________________________________________  RADIOLOGY  CXR: Peribronchial thickening noted, Mild vascular congestion seen ____________________________________________   PROCEDURES  Procedure(s) performed: None  Critical Care performed: No  ____________________________________________   INITIAL IMPRESSION / ASSESSMENT AND PLAN / ED COURSE  Pertinent labs & imaging results that were available during my care of the  patient were reviewed by me and considered in my medical decision making (see chart for details).  This is a 75 year old female who came into the hospital today with some shortness of breath with cold clamminess as well as shaking. The initial thought was the patient has worsening pneumonia but I did receive the results of her blood work which showed an elevated troponin. The patient's EKG did not show any ST segment elevation but did have some diffuse T-wave flattening. I will give the patient a dose of aspirin as well as place her on a heparin drip and I will admit the patient to the hospitalist service for further evaluation. ____________________________________________   FINAL CLINICAL IMPRESSION(S) / ED DIAGNOSES  Final diagnoses:  NSTEMI (non-ST elevated myocardial infarction) ([place])      Loney Hering, MD 08/03/15 [phone]

## 2015-08-03 NOTE — H&P (Signed)
[facility] [place] NAME: Kaitlyn Huff    MR#:  [id number]  DATE OF BIRTH:  Jul 28, 1940  DATE OF ADMISSION:  08/03/2015  PRIMARY CARE PHYSICIAN: [name], MD   REQUESTING/REFERRING PHYSICIAN:   CHIEF COMPLAINT:   Chief Complaint  Patient presents with  . Chills  . Shortness of Breath  . Pneumonia    HISTORY OF PRESENT ILLNESS: Kaitlyn Huff  is a 75 y.o. female with a known history of hyperlipidemia, osteoarthritis, GERD, hypertension, osteoporosis, rheumatoid arthritis presented to the emergency room with difficulty breathing and nausea and vomiting. Currently on oral Levaquin antibiotic which was started 4 days ago by primary care physician for pneumonia. Patient says she felt short of breath today and came to the emergency room. No history of any fever or chills.Has nausea and dry heaves but no evidence of vomiting. No complaints of any chest pain. No orthopnea or paroxysmal dyspnea. Patient was evaluated in the emergency room her first set of troponin was elevated but EKG was normal sinus rhythm with no ST segment changes.No history of any sick contacts at home or any recent travel.  PAST MEDICAL HISTORY:   Past Medical History  Diagnosis Date  . Rheumatoid arthritis(714.0)     MTX transaminitis, Leflunomide (rash), enbrel, plaquinil, prednisone, remicade, Imuran (transaminitis)  . Hyperlipidemia   . Diverticulitis   . Osteoarthritis   . GERD (gastroesophageal reflux disease)   . Hypertension   . Anemia   . Positive PPD     s/p INH (2006)  . Osteoporosis     actonel  . 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 Abdominal hysterectomy    . Tracheostomy  1959  . Tubal ligation    . Cervical cone biopsy    . Ovary surgery    . Cholecystectomy  06/22/14  . Colonoscopy with propofol N/A 07/09/2015    Procedure: COLONOSCOPY WITH  PROPOFOL;  Surgeon: Manya Silvas, MD;  Location: [facility] ENDOSCOPY;  Service: Endoscopy;  Laterality: N/A;  . Esophagogastroduodenoscopy (egd) with propofol N/A 07/09/2015    Procedure: ESOPHAGOGASTRODUODENOSCOPY (EGD) WITH PROPOFOL;  Surgeon: Manya Silvas, MD;  Location: [facility] ENDOSCOPY;  Service: Endoscopy;  Laterality: N/A;    SOCIAL HISTORY:  Social History  Substance Use Topics  . Smoking status: Never Smoker   . Smokeless tobacco: Never Used  . Alcohol Use: No    FAMILY HISTORY:  Family History  Problem Relation Age of Onset  . Heart disease Father     MI  . Heart disease Mother   . Valvular heart disease Mother   . Colon cancer Neg Hx   . Breast cancer Sister 60    DRUG ALLERGIES:  Allergies  Allergen Reactions  . Astelin [Azelastine Hcl]   . Ciprofloxacin Other (See Comments)    GI upset  . Codeine Other (See Comments)    Altered mental status  . Dilaudid [Hydromorphone]   . Flexeril [Cyclobenzaprine]   . Imuran [Azathioprine] Other (See Comments)    Abnormal liver function  . Keflex [Cephalexin] Other (See Comments)    GI upset  . Lisinopril Itching  . Lyrica [Pregabalin] Other (See Comments)    Sore gums  . Methotrexate Derivatives Other (See Comments)    Abnormal liver function  . Amoxicillin Rash  . Arava [Leflunomide] Rash  . Clindamycin/Lincomycin Rash  . Doxycycline Rash  .  Lodine [Etodolac] Rash  . Percocet [Oxycodone-Acetaminophen] Rash  . Sulfa Antibiotics Rash    REVIEW OF SYSTEMS:   CONSTITUTIONAL: No fever, fatigue or weakness.  EYES: No blurred or double vision.  EARS, NOSE, AND THROAT: No tinnitus or ear pain.  RESPIRATORY: No cough, has shortness of breath, no wheezing or hemoptysis.  CARDIOVASCULAR: No chest pain, orthopnea, edema.  GASTROINTESTINAL: ha nausea,no vomiting, no diarrhea or abdominal pain.  GENITOURINARY: No dysuria, hematuria.  ENDOCRINE: No polyuria, nocturia,  HEMATOLOGY: No anemia, easy bruising or  bleeding SKIN: No rash or lesion. MUSCULOSKELETAL: No joint pain or arthritis.   NEUROLOGIC: No tingling, numbness, weakness.  PSYCHIATRY: No anxiety or depression.   MEDICATIONS AT HOME:  Prior to Admission medications   Medication Sig Start Date End Date Taking? Authorizing Provider  ADVAIR DISKUS 250-50 MCG/DOSE AEPB USE 1 INHALATION INTO THE LUNGS EVERY 12 HOURS 05/19/15   [name], MD  albuterol (PROVENTIL HFA;VENTOLIN HFA) 108 (90 Base) MCG/ACT inhaler Inhale 2 puffs into the lungs every 4 (four) hours as needed for wheezing or shortness of breath. 07/30/15   Leone Haven, MD  alendronate (FOSAMAX) 70 MG tablet Take 70 mg by mouth once a week. Take with a full glass of water on an empty stomach.    Historical Provider, MD  ALPRAZolam [name]) 0.25 MG tablet 1 tablet q day prn 07/10/15   Coral Spikes, DO  aspirin 81 MG tablet Take 81 mg by mouth daily.    Historical Provider, MD  calcium-vitamin D (OSCAL 500/200 D-3) 500-200 MG-UNIT per tablet Take 1 tablet by mouth 2 (two) times daily.    Historical Provider, MD  cetirizine (ZYRTEC) 10 MG tablet TAKE 1 TABLET DAILY 12/01/14   [name], MD  colestipol (COLESTID) 1 G tablet Take by mouth. 04/09/15 04/08/16  Historical Provider, MD  DPH-Lido-AlHydr-MgHydr-Simeth (FIRST-MOUTHWASH BLM) SUSP take 5 to 10 milliliters by mouth SWISH AND SPIT three times a day 03/31/15   [name], MD  FeFum-FePo-FA-B Cmp-C-Zn-Mn-Cu (TANDEM PLUS) 162-115.2-1 MG CAPS TAKE 1 CAPSULE DAILY 07/10/15   [name], MD  furosemide (LASIX) 20 MG tablet TAKE 1 TABLET DAILY 03/17/15   [name], MD  gabapentin (NEURONTIN) 300 MG capsule Take 300 mg by mouth 4 (four) times daily.    Historical Provider, MD  HYDROcodone-acetaminophen (NORCO/VICODIN) 5-325 MG tablet Take 1 tablet by mouth every 8 (eight) hours as needed for moderate pain or severe pain. 06/07/15   Lisa Roca, MD  hydroxychloroquine (PLAQUENIL) 200 MG tablet Take 1 tablet (200 mg total)  by mouth 2 (two) times daily. 05/13/13   [name], MD  InFLIXimab (REMICADE IV) Inject every 6 weeks    Historical Provider, MD  levofloxacin (LEVAQUIN) 750 MG tablet Take 1 tablet (750 mg total) by mouth daily. 07/30/15   Leone Haven, MD  metoprolol succinate (TOPROL-XL) 50 MG 24 hr tablet TAKE 1 TABLET (50 MG TOTAL) DAILY. TAKE WITH OR IMMEDIATELY FOLLOWING A MEAL 12/31/14   [name], MD  Multiple Vitamin (MULTIVITAMIN) capsule Take 1 capsule by mouth daily.    Historical Provider, MD  NEXIUM 40 MG capsule TAKE 1 CAPSULE TWICE A DAY BEFORE A MEAL 07/03/15   [name], MD  potassium chloride (K-DUR) 10 MEQ tablet Take 10 mEq by mouth once.     Historical Provider, MD  potassium chloride (MICRO-K) 10 MEQ CR capsule TAKE 2 CAPSULES TWICE A DAY 06/15/15   [name], MD  predniSONE (DELTASONE) 10 MG tablet 40mg  daily then  30mg  daily then 20 mg daily then 10 mg daily 06/07/15   Lisa Roca, MD  simvastatin (ZOCOR) 20 MG tablet TAKE 1 TABLET DAILY 05/28/15   [name], MD  sodium chloride (OCEAN) 0.65 % nasal spray Place 1 spray into the nose as needed for congestion.    Historical Provider, MD  traMADol (ULTRAM) 50 MG tablet Take 1 tablet (50 mg total) by mouth at bedtime as needed. 09/01/14   Rubbie Battiest, NP      PHYSICAL EXAMINATION:   VITAL SIGNS: Blood pressure 129/68, pulse 64, temperature 97.9 F (36.6 C), temperature source Oral, resp. rate 21, height 5\' 4"  (1.626 m), weight 76.658 kg (169 lb), SpO2 97 %.  GENERAL:  75 y.o.-year-old patient lying in the bed with no acute distress.  EYES: Pupils equal, round, reactive to light and accommodation. No scleral icterus. Extraocular muscles intact.  HEENT: Head atraumatic, normocephalic. Oropharynx and nasopharynx clear.  NECK:  Supple, no jugular venous distention. No thyroid enlargement, no tenderness.  LUNGS: Normal breath sounds bilaterally, no wheezing, rales,rhonchi or crepitation. No use of accessory  muscles of respiration.  CARDIOVASCULAR: S1, S2 normal. No murmurs, rubs, or gallops.  ABDOMEN: Soft, nontender, nondistended. Bowel sounds present. No organomegaly or mass.  EXTREMITIES: No pedal edema, cyanosis, or clubbing.  NEUROLOGIC: Cranial nerves II through XII are intact. Muscle strength 5/5 in all extremities. Sensation intact. Gait not checked.  PSYCHIATRIC: The patient is alert and oriented x 3.  SKIN: No obvious rash, lesion, or ulcer.   LABORATORY PANEL:   CBC  Recent Labs Lab 08/03/15 0421  WBC 5.0  HGB 11.4*  HCT 32.5*  PLT 138*  MCV 90.5  MCH 31.8  MCHC 35.1  RDW 13.0   ------------------------------------------------------------------------------------------------------------------  Chemistries   Recent Labs Lab 08/03/15 0421  NA 131*  K 3.5  CL 96*  CO2 23  GLUCOSE 93  BUN 11  CREATININE 1.01*  CALCIUM 8.2*  AST 69*  ALT 53  ALKPHOS 38  BILITOT 0.7   ------------------------------------------------------------------------------------------------------------------ estimated creatinine clearance is 50.5 mL/min (by C-G formula based on Cr of 1.01). ------------------------------------------------------------------------------------------------------------------ No results for input(s): TSH, T4TOTAL, T3FREE, THYROIDAB in the last 72 hours.  Invalid input(s): FREET3   Coagulation profile  Recent Labs Lab 08/03/15 0425  INR 1.07   ------------------------------------------------------------------------------------------------------------------- No results for input(s): DDIMER in the last 72 hours. -------------------------------------------------------------------------------------------------------------------  Cardiac Enzymes  Recent Labs Lab 08/03/15 0421  TROPONINI 0.15*   ------------------------------------------------------------------------------------------------------------------ Invalid input(s):  POCBNP  ---------------------------------------------------------------------------------------------------------------  Urinalysis    Component Value Date/Time   COLORURINE YELLOW* 03/30/2015 1817   COLORURINE Straw 11/02/2014 0225   APPEARANCEUR CLEAR* 03/30/2015 1817   APPEARANCEUR Clear 11/02/2014 0225   LABSPEC 1.013 03/30/2015 1817   LABSPEC 1.010 11/02/2014 0225   PHURINE 5.0 03/30/2015 1817   PHURINE 5.0 11/02/2014 0225   GLUCOSEU NEGATIVE 03/30/2015 1817   GLUCOSEU Negative 11/02/2014 0225   HGBUR NEGATIVE 03/30/2015 1817   HGBUR Negative 11/02/2014 0225   BILIRUBINUR NEGATIVE 03/30/2015 1817   BILIRUBINUR Negative 11/02/2014 0225   KETONESUR NEGATIVE 03/30/2015 1817   KETONESUR Negative 11/02/2014 0225   PROTEINUR NEGATIVE 03/30/2015 1817   PROTEINUR Negative 11/02/2014 0225   NITRITE NEGATIVE 03/30/2015 1817   NITRITE Negative 11/02/2014 0225   LEUKOCYTESUR NEGATIVE 03/30/2015 1817   LEUKOCYTESUR Negative 11/02/2014 0225     RADIOLOGY: Dg Chest 2 View  08/03/2015  CLINICAL DATA:  Acute onset of shortness of breath, dry cough, generalized weakness and dizziness. Initial encounter. EXAM: CHEST  2 VIEW COMPARISON:  Chest radiograph performed 07/30/2015 FINDINGS: The lungs are well-aerated. Peribronchial thickening is noted. Mild vascular congestion is seen. There is no evidence of focal opacification, pleural effusion or pneumothorax. The heart is borderline normal in size. Right paratracheal prominence is stable and likely reflects normal vasculature. No acute osseous abnormalities are seen. Clips are noted within the right upper quadrant, reflecting prior cholecystectomy. IMPRESSION: Peribronchial thickening noted.  Mild vascular congestion seen. Electronically Signed   By: Garald Balding M.D.   On: 08/03/2015 04:41    EKG: Orders placed or performed during the hospital encounter of 08/03/15  . EKG 12-Lead  . EKG 12-Lead  . ED EKG  . ED EKG    IMPRESSION AND  PLAN: 75 year old female patient with history of rheumatoid arthritis, hypertension, hyperlipidemia, osteoporosis presented to the emergency room with difficulty breathing and nausea. Workup in the emergency room showed elevated troponin. Admitting diagnosis 1. Non-ST elevation MI 2. Dyspnea 3. Hyperlipidemia 4. Hypertension 5. Rheumatoid arthritis 6. Nausea Treatment plan Admit patient to telemetry Continue IV heparin drip Start patient on aspirin Check echocardiogram Cardiology consultation prn nitrates for chest pain Resume Levaquin antibiotic  All the records are reviewed and case discussed with ED provider. Management plans discussed with the patient, family and they are in agreement.  CODE STATUS:FULL Code Status History    This patient does not have a recorded code status. Please follow your organizational policy for patients in this situation.       TOTAL TIME TAKING CARE OF THIS PATIENT: 51 minutes.    Saundra Shelling M.D on 08/03/2015 at 6:13 AM  Between 7am to 6pm - Pager - [phone]  After 6pm go to [email or web address] - password [email or web address] [facility]  Office  [phone]  CC: Primary care physician; [name], MD

## 2015-08-03 NOTE — ED Notes (Signed)
Pt says she is currently taking antibiotic for Pneumonia; tonight she is feeling "nervous" and short of breath; talking in short full sentences in triage; pt says she feels like she's rattling in her chest and wheezing;

## 2015-08-03 NOTE — Progress Notes (Signed)
ANTICOAGULATION CONSULT NOTE - Initial Consult  Pharmacy Consult for heparin Indication: chest pain/ACS  Allergies  Allergen Reactions  . Astelin [Azelastine Hcl]   . Ciprofloxacin Other (See Comments)    GI upset  . Codeine Other (See Comments)    Altered mental status  . Dilaudid [Hydromorphone]   . Flexeril [Cyclobenzaprine]   . Imuran [Azathioprine] Other (See Comments)    Abnormal liver function  . Keflex [Cephalexin] Other (See Comments)    GI upset  . Lisinopril Itching  . Lyrica [Pregabalin] Other (See Comments)    Sore gums  . Methotrexate Derivatives Other (See Comments)    Abnormal liver function  . Amoxicillin Rash  . Arava [Leflunomide] Rash  . Clindamycin/Lincomycin Rash  . Doxycycline Rash  . Lodine [Etodolac] Rash  . Percocet [Oxycodone-Acetaminophen] Rash  . Sulfa Antibiotics Rash    Patient Measurements: Height: 5\' 4"  (162.6 cm) Weight: 169 lb (76.658 kg) IBW/kg (Calculated) : 54.7 Heparin Dosing Weight: 70.9 kg  Vital Signs: Temp: 97.9 F (36.6 C) (01/23 0331) Temp Source: Oral (01/23 0331) BP: 144/58 mmHg (01/23 0400) Pulse Rate: 64 (01/23 0415)  Labs:  Recent Labs  08/03/15 0421  HGB 11.4*  HCT 32.5*  PLT 138*  CREATININE 1.01*  TROPONINI 0.15*    Estimated Creatinine Clearance: 50.5 mL/min (by C-G formula based on Cr of 1.01).   Medical History: Past Medical History  Diagnosis Date  . Rheumatoid arthritis(714.0)     MTX transaminitis, Leflunomide (rash), enbrel, plaquinil, prednisone, remicade, Imuran (transaminitis)  . Hyperlipidemia   . Diverticulitis   . Osteoarthritis   . GERD (gastroesophageal reflux disease)   . Hypertension   . Anemia   . Positive PPD     s/p INH (2006)  . Osteoporosis     actonel  . 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 heparin      Assessment: 74 yof cc nervous feeling, SOB. Pharmacy consulted to dose heparin.  Goal of Therapy:  Heparin level  0.3-0.7 units/ml Monitor platelets by anticoagulation protocol: Yes   Plan:  Give 4000 units bolus x 1 Start heparin infusion at 850 units/hr Check anti-Xa level in 8 hours and daily while on heparin Continue to monitor H&H and platelets  Laural Benes, Pharm.D., BCPS Clinical Pharmacist 08/03/2015,5:42 AM

## 2015-08-04 LAB — HEPARIN LEVEL (UNFRACTIONATED): HEPARIN UNFRACTIONATED: 0.6 [IU]/mL (ref 0.30–0.70)

## 2015-08-04 LAB — CBC
HCT: 28.8 % — ABNORMAL LOW (ref 35.0–47.0)
HEMOGLOBIN: 9.8 g/dL — AB (ref 12.0–16.0)
MCH: 30.5 pg (ref 26.0–34.0)
MCHC: 33.8 g/dL (ref 32.0–36.0)
MCV: 90.2 fL (ref 80.0–100.0)
PLATELETS: 128 10*3/uL — AB (ref 150–440)
RBC: 3.2 MIL/uL — AB (ref 3.80–5.20)
RDW: 13 % (ref 11.5–14.5)
WBC: 2.9 10*3/uL — ABNORMAL LOW (ref 3.6–11.0)

## 2015-08-04 LAB — BASIC METABOLIC PANEL
Anion gap: 6 (ref 5–15)
BUN: 10 mg/dL (ref 6–20)
CALCIUM: 8.1 mg/dL — AB (ref 8.9–10.3)
CO2: 26 mmol/L (ref 22–32)
CREATININE: 0.84 mg/dL (ref 0.44–1.00)
Chloride: 103 mmol/L (ref 101–111)
GFR calc Af Amer: >60 mL/min (ref >60)
Glucose, Bld: 116 mg/dL — ABNORMAL HIGH (ref 65–99)
Potassium: 4.1 mmol/L (ref 3.5–5.1)
Sodium: 135 mmol/L (ref 135–145)

## 2015-08-04 LAB — LIPID PANEL
CHOL/HDL RATIO: 1.8 ratio
CHOLESTEROL: 92 mg/dL (ref 0–200)
HDL: 51 mg/dL (ref >40)
LDL Cholesterol: 30 mg/dL (ref 0–99)
TRIGLYCERIDES: 53 mg/dL (ref <150)
VLDL: 11 mg/dL (ref 0–40)

## 2015-08-04 MED ORDER — INFLUENZA VAC SPLIT QUAD 0.5 ML IM SUSY
0.5000 mL | PREFILLED_SYRINGE | INTRAMUSCULAR | Status: AC
  Administered 2015-08-05: 0.5 mL via INTRAMUSCULAR
  Filled 2015-08-04: qty 0.5

## 2015-08-04 MED ORDER — FUROSEMIDE 10 MG/ML IJ SOLN
20.0000 mg | Freq: Every day | INTRAMUSCULAR | Status: DC
  Administered 2015-08-04: 20 mg via INTRAVENOUS
  Filled 2015-08-04: qty 2

## 2015-08-04 MED ORDER — TECHNETIUM TC 99M SESTAMIBI - CARDIOLITE
29.1300 | Freq: Once | INTRAVENOUS | Status: AC | PRN
  Administered 2015-08-04: 29.13 via INTRAVENOUS

## 2015-08-04 MED ORDER — ALBUTEROL SULFATE (2.5 MG/3ML) 0.083% IN NEBU
2.5000 mg | INHALATION_SOLUTION | Freq: Four times a day (QID) | RESPIRATORY_TRACT | Status: DC
  Administered 2015-08-04 – 2015-08-05 (×4): 2.5 mg via RESPIRATORY_TRACT
  Filled 2015-08-04 (×4): qty 3

## 2015-08-04 MED ORDER — REGADENOSON 0.4 MG/5ML IV SOLN
0.4000 mg | Freq: Once | INTRAVENOUS | Status: AC
  Administered 2015-08-04: 0.4 mg via INTRAVENOUS

## 2015-08-04 MED ORDER — PROMETHAZINE HCL 25 MG/ML IJ SOLN
12.5000 mg | Freq: Four times a day (QID) | INTRAMUSCULAR | Status: DC | PRN
  Administered 2015-08-04 – 2015-08-05 (×3): 12.5 mg via INTRAVENOUS
  Filled 2015-08-04 (×3): qty 1

## 2015-08-04 MED ORDER — FUROSEMIDE 20 MG PO TABS
20.0000 mg | ORAL_TABLET | Freq: Every day | ORAL | Status: DC
  Administered 2015-08-05: 20 mg via ORAL
  Filled 2015-08-04: qty 1

## 2015-08-04 MED ORDER — TECHNETIUM TC 99M SESTAMIBI - CARDIOLITE
13.6000 | Freq: Once | INTRAVENOUS | Status: AC | PRN
  Administered 2015-08-04: 09:00:00 13.6 via INTRAVENOUS

## 2015-08-04 NOTE — Evaluation (Signed)
Physical Therapy Evaluation Patient Details Name: Kaitlyn Huff MRN: [id number] DOB: 07/19/1940 Today's Date: 08/04/2015   History of Present Illness  Kaitlyn Huff is a 75 y.o. female with a known history of hyperlipidemia, osteoarthritis, GERD, hypertension, osteoporosis, rheumatoid arthritis presented to the emergency room with difficulty breathing and nausea and vomiting. Currently on oral Levaquin antibiotic which was started 4 days ago by primary care physician for pneumonia. Patient says she felt short of breath today and came to the emergency room. No history of any fever or chills.Has nausea and dry heaves but no evidence of vomiting. No complaints of any chest pain. No orthopnea or paroxysmal dyspnea. Patient was evaluated in the emergency room her first set of troponin was elevated but EKG was normal sinus rhythm with no ST segment changes.No history of any sick contacts at home or any recent travel. Pt was admitted for COPD exaccerbation as well as acute bronchitis. It was determined that elevated troponin was due to demand ischemia and pt underwent stress test earlier in the day. Family reports 1 fall in the last 12 months however pt denies  Clinical Impression  Pt demonstrates good tolerance to activity with physical therapy. She is steady and stable with bed mobility and transfers. Pt demonstrates good stability with ambulation with use of rolling walker however as soon as she attempts ambulation without assistive device she demonstrates considerable decline in stability and speed. Pt will need to use rolling walker at discharge. She would also benefit from [facility] PT as well as information regarding fall alert system as she does have some extended periods of being home alone. Pt agrees to [facility] PT as well as fall alert system but has questions regarding cost which will be deferred to discharge planning team. SaO2 remains >90% on room air during entire PT session. RN notified of O2 sats as well as  mobility. Pt will benefit from skilled PT services to address deficits in strength, balance, and mobility in order to return to full function at home.     Follow Up Recommendations Home health PT;Supervision - Intermittent    Equipment Recommendations  Rolling walker with 5" wheels (Information regarding med alert bracelet/necklace)    Recommendations for Other Services       Precautions / Restrictions Precautions Precautions: Fall Restrictions Weight Bearing Restrictions: No      Mobility  Bed Mobility Overal bed mobility: Independent             General bed mobility comments: good speed and sequencing with HOB elevated and use of bed rails  Transfers Overall transfer level: Needs assistance Equipment used: Rolling walker (2 wheeled) Transfers: Sit to/from Stand Sit to Stand: Supervision         General transfer comment: Good LE strength noted. Pt able to stand within acceptable time for age norms. No evidence for instability  Ambulation/Gait Ambulation/Gait assistance: Min guard Ambulation Distance (Feet): 250 Feet Assistive device: Rolling walker (2 wheeled) Gait Pattern/deviations: Decreased step length - right;Decreased step length - left Gait velocity: Decreased   General Gait Details: Pt demonstrates good speed and stability with use of rolling walker. Able to perform horizontal and vertical head turns with rolling walker without evidence for instability. SaO2 remains >90% on room air during entire session. Pt reports 5/10 on RPE during ambulation. Attempted ambulation without walker and pt with considerable decline in ambulation. Overall speed and step length decrease. Performed horizontal head turns with moderate to severe disruption in balance. Pt provided multiple standing rest  breaks but she is able to complete a full lap around [place] (Stroke Patients Only)       Balance Overall  balance assessment: Needs assistance Sitting-balance support: No upper extremity supported Sitting balance-Leahy Scale: Good     Standing balance support: No upper extremity supported Standing balance-Leahy Scale: Fair Standing balance comment: Positive Rhomberg for +2 sway but no LOB. Decreased balance in narrow stance with eyes open as well. Single leg balance is <2 seconds bilateral and forward reach is 5-10"                             Pertinent Vitals/Pain Pain Assessment: No/denies pain    Home Living Family/patient expects to be discharged to:: Private residence Living Arrangements: Children Available Help at Discharge: Family Type of Home: House Home Access: Stairs to enter Entrance Stairs-Rails: Can reach both Entrance Stairs-Number of Steps: 5 Home Layout: Laundry or work area in basement;Able to live on main level with bedroom/bathroom Home Equipment: Cane - quad (no walker, no grab bars, no BSC)      Prior Function Level of Independence: Independent         Comments: Independent with ADLs/IADLs     Hand Dominance   Dominant Hand: Right    Extremity/Trunk Assessment   Upper Extremity Assessment: Overall WFL for tasks assessed           Lower Extremity Assessment: Overall WFL for tasks assessed (Limited L ankle DF testing. Pt reports stress fx L foot)         Communication   Communication: No difficulties  Cognition Arousal/Alertness: Awake/alert Behavior During Therapy: WFL for tasks assessed/performed Overall Cognitive Status: Within Functional Limits for tasks assessed                      General Comments      Exercises        Assessment/Plan    PT Assessment Patient needs continued PT services  PT Diagnosis Difficulty walking;Abnormality of gait;Generalized weakness   PT Problem List Decreased strength;Decreased activity tolerance;Decreased balance;Decreased mobility;Decreased knowledge of use of DME;Decreased  safety awareness;Cardiopulmonary status limiting activity  PT Treatment Interventions DME instruction;Gait training;Stair training;Therapeutic activities;Therapeutic exercise;Balance training;Neuromuscular re-education;Patient/family education   PT Goals (Current goals can be found in the Care Plan section) Acute Rehab PT Goals Patient Stated Goal: "I want to get back home, do you think they will let me leave today?" PT Goal Formulation: With patient/family Time For Goal Achievement: 08/18/15 Potential to Achieve Goals: Good    Frequency Min 2X/week   Barriers to discharge Decreased caregiver support Mostly has 24/7 support from daughter however daughter occasionally travels to the beach    Co-evaluation               End of Session Equipment Utilized During Treatment: Gait belt Activity Tolerance: Patient tolerated treatment well Patient left: in bed;with call bell/phone within reach;with bed alarm set;with family/visitor present Nurse Communication: Mobility status         Time: 1345-1415 PT Time Calculation (min) (ACUTE ONLY): 30 min   Charges:   PT Evaluation $PT Eval Moderate Complexity: 1 Procedure PT Treatments $Gait Training: 8-22 mins   PT G Codes:       Lyndel Safe Huprich PT, DPT   Huprich,Jason 08/04/2015, 3:18 PM

## 2015-08-04 NOTE — Progress Notes (Signed)
°   08/04/15 0700  Clinical Encounter Type  Referral From Nurse  Consult/Referral To Chaplain  Attempted visit.  Patient sleeping. [facility] [phone]

## 2015-08-04 NOTE — Progress Notes (Signed)
[facility] Encounter Note  Patient: Kaitlyn Huff / Admit Date: 08/03/2015 / Date of Encounter: 08/04/2015, 5:45 PM   Subjective: Wheezing shortness of breath weakness but no evidence of chest discomfort  Review of Systems: Positive for: Shortness of breath Negative for: Vision change, hearing change, syncope, dizziness, nausea, vomiting,diarrhea, bloody stool, stomach pain, cough, congestion, diaphoresis, urinary frequency, urinary pain,skin lesions, skin rashes Others previously listed  Objective: Telemetry: Normal sinus rhythm Physical Exam: Blood pressure 124/58, pulse 70, temperature 98.1 F (36.7 C), temperature source Oral, resp. rate 21, height 5\' 4"  (1.626 m), weight 169 lb (76.658 kg), SpO2 96 %. Body mass index is 28.99 kg/(m^2). General: Well developed, well nourished, in no acute distress. Head: Normocephalic, atraumatic, sclera non-icteric, no xanthomas, nares are without discharge. Neck: No apparent masses Lungs: Normal respirations with no wheezes, no rhonchi, no rales , no crackles   Heart: Regular rate and rhythm, normal S1 S2, no murmur, no rub, no gallop, PMI is normal size and placement, carotid upstroke normal without bruit, jugular venous pressure normal Abdomen: Soft, non-tender, non-distended with normoactive bowel sounds. No hepatosplenomegaly. Abdominal aorta is normal size without bruit Extremities: No edema, no clubbing, no cyanosis, no ulcers,  Peripheral: 2+ radial, 2+ femoral, 2+ dorsal pedal pulses Neuro: Alert and oriented. Moves all extremities spontaneously. Psych:  Responds to questions appropriately with a normal affect.   Intake/Output Summary (Last 24 hours) at 08/04/15 1745 Last data filed at 08/04/15 1500  Gross per 24 hour  Intake   2176 ml  Output   2400 ml  Net   -224 ml    Inpatient Medications:  . albuterol  2.5 mg Nebulization Q6H  . antiseptic oral rinse  7 mL Mouth Rinse BID  . aspirin EC  81 mg Oral  Daily  . colestipol  1 g Oral Daily  . furosemide  20 mg Intravenous Daily  . gabapentin  300 mg Oral QID  . guaiFENesin  600 mg Oral BID  . hydroxychloroquine  200 mg Oral BID  . [START ON 08/05/2015] Influenza vac split quadrivalent PF  0.5 mL Intramuscular Tomorrow-1000  . levofloxacin  750 mg Oral Daily  . metoprolol succinate  50 mg Oral Daily  . mometasone-formoterol  2 puff Inhalation BID  . multivitamin with minerals  1 tablet Oral Daily  . pantoprazole  40 mg Oral Daily  . pramipexole  1 mg Oral QHS  . simvastatin  20 mg Oral Daily  . sodium chloride  3 mL Intravenous Q12H  . tiotropium  18 mcg Inhalation Daily   Infusions:  . heparin 850 Units/hr (08/04/15 1129)    Labs:  Recent Labs  08/03/15 0421 08/04/15 0606  NA 131* 135  K 3.5 4.1  CL 96* 103  CO2 23 26  GLUCOSE 93 116*  BUN 11 10  CREATININE 1.01* 0.84  CALCIUM 8.2* 8.1*    Recent Labs  08/03/15 0421  AST 69*  ALT 53  ALKPHOS 38  BILITOT 0.7  PROT 6.7  ALBUMIN 3.6    Recent Labs  08/03/15 0421 08/04/15 0606  WBC 5.0 2.9*  HGB 11.4* 9.8*  HCT 32.5* 28.8*  MCV 90.5 90.2  PLT 138* 128*    Recent Labs  08/03/15 0421 08/03/15 1059 08/03/15 1523 08/03/15 2136  TROPONINI 0.15* 0.14* 0.13* 0.12*   Invalid input(s): POCBNP No results for input(s): HGBA1C in the last 72 hours.   Weights: Filed Weights   08/03/15 0331  Weight: 169 lb (76.658  kg)     Radiology/Studies:  Dg Chest 2 View  08/03/2015  CLINICAL DATA:  Acute onset of shortness of breath, dry cough, generalized weakness and dizziness. Initial encounter. EXAM: CHEST  2 VIEW COMPARISON:  Chest radiograph performed 07/30/2015 FINDINGS: The lungs are well-aerated. Peribronchial thickening is noted. Mild vascular congestion is seen. There is no evidence of focal opacification, pleural effusion or pneumothorax. The heart is borderline normal in size. Right paratracheal prominence is stable and likely reflects normal vasculature.  No acute osseous abnormalities are seen. Clips are noted within the right upper quadrant, reflecting prior cholecystectomy. IMPRESSION: Peribronchial thickening noted.  Mild vascular congestion seen. Electronically Signed   By: Garald Balding M.D.   On: 08/03/2015 04:41   Dg Chest 2 View  07/30/2015  CLINICAL DATA:  Cough and shortness of breath for 2 days. EXAM: CHEST  2 VIEW COMPARISON:  January 14, 2015 FINDINGS: The mediastinal contour is normal. Heart size is mildly enlarged. Both lungs are clear. The visualized skeletal structures are stable. IMPRESSION: No active cardiopulmonary disease. Electronically Signed   By: Abelardo Diesel M.D.   On: 07/30/2015 16:19     Assessment and Recommendation  75 y.o. female with known essential hypertension mitral valve insufficiency hyperlipidemia and elevated troponin consistent with demand ischemia without evidence of myocardial infarction Echocardiogram shows normal LV systolic function contraction fraction greater than 55% with moderate mitral regurgitation 1. Continue treatment of bronchitis and or infection necessary 2. No further cardiac diagnostics necessary at this time although awaiting remainder of stress test evaluation 3. Continue hypertension control with metoprolol 4. Lasix as needed for occasional pulmonary edema and/or viral involvement 5. Begin ambulation and follow for improvements of symptoms Signed, Serafina Royals M.D. FACC

## 2015-08-04 NOTE — Progress Notes (Signed)
Pt has had intermittent nausea and dry heaves throughout the night. No chest pain or diaphoresis noted. Administered phenergan for symptoms. Will continue to monitor and assess.

## 2015-08-04 NOTE — Progress Notes (Signed)
ANTICOAGULATION CONSULT NOTE - Follow Up Consult  Pharmacy Consult for Heparin drip Indication: chest pain/ACS  Allergies  Allergen Reactions  . Astelin [Azelastine Hcl]   . Ciprofloxacin Other (See Comments)    GI upset  . Codeine Other (See Comments)    Altered mental status  . Dilaudid [Hydromorphone]   . Flexeril [Cyclobenzaprine]   . Imuran [Azathioprine] Other (See Comments)    Abnormal liver function  . Keflex [Cephalexin] Other (See Comments)    GI upset  . Lisinopril Itching  . Lyrica [Pregabalin] Other (See Comments)    Sore gums  . Methotrexate Derivatives Other (See Comments)    Abnormal liver function  . Amoxicillin Rash  . Arava [Leflunomide] Rash  . Clindamycin/Lincomycin Rash  . Doxycycline Rash  . Lodine [Etodolac] Rash  . Percocet [Oxycodone-Acetaminophen] Rash  . Sulfa Antibiotics Rash    Patient Measurements: Height: 5\' 4"  (162.6 cm) Weight: 169 lb (76.658 kg) IBW/kg (Calculated) : 54.7 Heparin Dosing Weight: 70.9 kg  Vital Signs: Temp: 98.1 F (36.7 C) (01/24 1137) Temp Source: Oral (01/24 1137) BP: 124/58 mmHg (01/24 1137) Pulse Rate: 70 (01/24 1137)  Labs:  Recent Labs  08/03/15 0421 08/03/15 0425 08/03/15 1059 08/03/15 1523 08/03/15 2136 08/04/15 0606  HGB 11.4*  --   --   --   --  9.8*  HCT 32.5*  --   --   --   --  28.8*  PLT 138*  --   --   --   --  128*  APTT  --  27  --   --   --   --   LABPROT  --  14.1  --   --   --   --   INR  --  1.07  --   --   --   --   HEPARINUNFRC  --   --   --  0.46 0.62 0.60  CREATININE 1.01*  --   --   --   --  0.84  TROPONINI 0.15*  --  0.14* 0.13* 0.12*  --     Estimated Creatinine Clearance: 60.7 mL/min (by C-G formula based on Cr of 0.84).   Medications:  Infusions:  . heparin 850 Units/hr (08/04/15 1129)    Assessment: Patient currently ordered Heparin drip at 850 units/hr for NSTEMI.  1/24 0600 HL resulted at 0.60  Goal of Therapy:  Heparin level 0.3-0.7 units/ml Monitor  platelets by anticoagulation protocol: Yes   Plan:  Will continue at current rate. Next Heparin level and CBC to be checked with 1/25 AM labs  Paulina Fusi, PharmD, BCPS 08/04/2015 2:33 PM

## 2015-08-04 NOTE — Progress Notes (Signed)
Patient is having dry heaves after SVN treatment, notified physician. New orders given.

## 2015-08-04 NOTE — Progress Notes (Signed)
[facility] [place] NAME: Nanny Marcello    MR#:  [id number]  DATE OF BIRTH:  [birth year]  SUBJECTIVE:  CHIEF COMPLAINT:   Chief Complaint  Patient presents with  . Chills  . Shortness of Breath  . Pneumonia   patient is 75 year old female with past medical history significant for history of remote as a fasciitis, hyperlipidemia, osteoarthritis, hypertension, valvular heart disease who presents to the hospital with complaints of nausea, vomiting and shortness of breath. Patient was initiated on levofloxacin 4 days ago prior by primary care physician for pneumonia, however, did not improve and presented to the hospital with shortness of breath. She had no chest pains. EKG was remarkable for no significant ST-T changes. Patient's troponin was found to be elevated at 0.15 on the first set declining afterwards. Chest x-ray showed peribronchial thickening, mild vascular congestion. Patient was initiated on antibiotics intravenously and admitted to the hospital for further evaluation and treatment. She feels satisfactory today, however, weak and still does not feel that she improved a lot. She remains on oxygen at 2 L of oxygen with satisfactory O2 sats. Nausea and vomiting resolved and she eats 100% of offered meals .  Patient admits of cough and shortness of breath as well as wheezing. Denies any smoking history, but admits of living with person who smoked for 40 years. Blood cultures are negative 2 Review of Systems  Constitutional: Negative for fever, chills and weight loss.  HENT: Negative for congestion.   Eyes: Negative for blurred vision and double vision.  Respiratory: Positive for cough, shortness of breath and wheezing. Negative for sputum production.   Cardiovascular: Negative for chest pain, palpitations, orthopnea, leg swelling and PND.  Gastrointestinal: Negative for nausea, vomiting, abdominal pain, diarrhea, constipation and blood in  stool.  Genitourinary: Negative for dysuria, urgency, frequency and hematuria.  Musculoskeletal: Negative for falls.  Neurological: Negative for dizziness, tremors, focal weakness and headaches.  Endo/Heme/Allergies: Does not bruise/bleed easily.  Psychiatric/Behavioral: Negative for depression. The patient does not have insomnia.     VITAL SIGNS: Blood pressure 124/58, pulse 70, temperature 98.1 F (36.7 C), temperature source Oral, resp. rate 21, height 5\' 4"  (1.626 m), weight 76.658 kg (169 lb), SpO2 96 %.  PHYSICAL EXAMINATION:   GENERAL:  75 y.o.-year-old patient lying in the bed with no acute distress.  EYES: Pupils equal, round, reactive to light and accommodation. No scleral icterus. Extraocular muscles intact.  HEENT: Head atraumatic, normocephalic. Oropharynx and nasopharynx clear.  NECK:  Supple, no jugular venous distention. No thyroid enlargement, no tenderness.  LUNGS: Diminished breath sounds bilaterally, scattered  crepitations. Not  using accessory muscles of respiration CARDIOVASCULAR: S1, S2 normal. No murmurs, rubs, or gallops.  ABDOMEN: Soft, nontender, nondistended. Bowel sounds present. No organomegaly or mass.  EXTREMITIES: No pedal edema, cyanosis, or clubbing. Mild ankle edema, mostly on the right side NEUROLOGIC: Cranial nerves II through XII are intact. Muscle strength 5/5 in all extremities. Sensation intact. Gait not checked.  PSYCHIATRIC: The patient is alert and oriented x 3.  SKIN: No obvious rash, lesion, or ulcer.   ORDERS/RESULTS REVIEWED:   CBC  Recent Labs Lab 08/03/15 0421 08/04/15 0606  WBC 5.0 2.9*  HGB 11.4* 9.8*  HCT 32.5* 28.8*  PLT 138* 128*  MCV 90.5 90.2  MCH 31.8 30.5  MCHC 35.1 33.8  RDW 13.0 13.0   ------------------------------------------------------------------------------------------------------------------  Chemistries   Recent Labs Lab 08/03/15 0421 08/04/15 0606  NA 131* 135  K 3.5 4.1  CL 96* 103  CO2 23 26   GLUCOSE 93 116*  BUN 11 10  CREATININE 1.01* 0.84  CALCIUM 8.2* 8.1*  AST 69*  --   ALT 53  --   ALKPHOS 38  --   BILITOT 0.7  --    ------------------------------------------------------------------------------------------------------------------ estimated creatinine clearance is 60.7 mL/min (by C-G formula based on Cr of 0.84). ------------------------------------------------------------------------------------------------------------------ No results for input(s): TSH, T4TOTAL, T3FREE, THYROIDAB in the last 72 hours.  Invalid input(s): FREET3  Cardiac Enzymes  Recent Labs Lab 08/03/15 1059 08/03/15 1523 08/03/15 2136  TROPONINI 0.14* 0.13* 0.12*   ------------------------------------------------------------------------------------------------------------------ Invalid input(s): POCBNP ---------------------------------------------------------------------------------------------------------------  RADIOLOGY: Dg Chest 2 View  08/03/2015  CLINICAL DATA:  Acute onset of shortness of breath, dry cough, generalized weakness and dizziness. Initial encounter. EXAM: CHEST  2 VIEW COMPARISON:  Chest radiograph performed 07/30/2015 FINDINGS: The lungs are well-aerated. Peribronchial thickening is noted. Mild vascular congestion is seen. There is no evidence of focal opacification, pleural effusion or pneumothorax. The heart is borderline normal in size. Right paratracheal prominence is stable and likely reflects normal vasculature. No acute osseous abnormalities are seen. Clips are noted within the right upper quadrant, reflecting prior cholecystectomy. IMPRESSION: Peribronchial thickening noted.  Mild vascular congestion seen. Electronically Signed   By: Garald Balding M.D.   On: 08/03/2015 04:41    EKG:  Orders placed or performed during the hospital encounter of 08/03/15  . EKG 12-Lead  . EKG 12-Lead  . ED EKG  . ED EKG    ASSESSMENT AND PLAN:  Principal Problem:   Non-STEMI  (non-ST elevated myocardial infarction) (HCC) Active Problems:   Nausea & vomiting 1. Dyspnea, suspected acute diastolic congestive heart failure exacerbation, less so COPD exacerbation, initiate patient on Lasix intravenously. Follow clinically, wean off oxygen as tolerated, normal echocardiogram 2. COPD exacerbation, discontinue steroids, continue inhalation therapy was with budesonide, albuterol and tiotropium, stable, some better clinically. Continue oxygen therapy and wean off oxygen as tolerated 3. Acute bronchitis, get sputum cultures if possible, and dyspnea patient on Humibid, levofloxacin 4. Elevated troponin, likely demand ischemia, normal echocardiogram , appreciate cardiology input , stress test result is pending 5. Hyponatremia, resolved 6. Renal insufficiency, resolved with IV fluid administration, unremarkable urinalysis 7. Anemia. Get Hemoccult, continue aspirin 8. Thrombocytopenia, worse with heparin infusion, discontinue heparin, follow platelets in the morning   Management plans discussed with the patient, family and they are in agreement.   DRUG ALLERGIES:  Allergies  Allergen Reactions  . Astelin [Azelastine Hcl]   . Ciprofloxacin Other (See Comments)    GI upset  . Codeine Other (See Comments)    Altered mental status  . Dilaudid [Hydromorphone]   . Flexeril [Cyclobenzaprine]   . Imuran [Azathioprine] Other (See Comments)    Abnormal liver function  . Keflex [Cephalexin] Other (See Comments)    GI upset  . Lisinopril Itching  . Lyrica [Pregabalin] Other (See Comments)    Sore gums  . Methotrexate Derivatives Other (See Comments)    Abnormal liver function  . Amoxicillin Rash  . Arava [Leflunomide] Rash  . Clindamycin/Lincomycin Rash  . Doxycycline Rash  . Lodine [Etodolac] Rash  . Percocet [Oxycodone-Acetaminophen] Rash  . Sulfa Antibiotics Rash    CODE STATUS:     Code Status Orders        Start     Ordered   08/03/15 0950  Full code    Continuous     08/03/15 0949    Code Status  History    Date Active Date Inactive Code Status Order ID Comments User Context   This patient has a current code status but no historical code status.      TOTAL TIME TAKING CARE OF THIS PATIENT: 40  minutes.  Discussed this patient's family  Nechuma Boven M.D on 08/04/2015 at 6:13 PM  Between 7am to 6pm - Pager - [phone]  After 6pm go to [email or web address] - password [email or web address] [facility]  Office  [phone]  CC: Primary care physician; [name], MD

## 2015-08-04 NOTE — Progress Notes (Signed)
Pain noted    2/10.  Will medicate pt and reassess 30 minutes after administering the medication. Pt with family in the room.  Call bell within reach, bed in low position.  Pt had a stress test today and results will be discussed with the pt by the physician in the am

## 2015-08-05 ENCOUNTER — Inpatient Hospital Stay: Payer: Medicare Other

## 2015-08-05 DIAGNOSIS — I110 Hypertensive heart disease with heart failure: Secondary | ICD-10-CM | POA: Diagnosis not present

## 2015-08-05 LAB — CBC
HEMATOCRIT: 29.7 % — AB (ref 35.0–47.0)
HEMOGLOBIN: 10.2 g/dL — AB (ref 12.0–16.0)
MCH: 31.4 pg (ref 26.0–34.0)
MCHC: 34.3 g/dL (ref 32.0–36.0)
MCV: 91.5 fL (ref 80.0–100.0)
Platelets: 137 10*3/uL — ABNORMAL LOW (ref 150–440)
RBC: 3.25 MIL/uL — ABNORMAL LOW (ref 3.80–5.20)
RDW: 13.2 % (ref 11.5–14.5)
WBC: 4.1 10*3/uL (ref 3.6–11.0)

## 2015-08-05 LAB — HEPARIN LEVEL (UNFRACTIONATED)

## 2015-08-05 MED ORDER — SODIUM CHLORIDE 0.9 % IJ SOLN
INTRAMUSCULAR | Status: AC
  Administered 2015-08-05: 10 mL via INTRAVENOUS
  Filled 2015-08-05: qty 10

## 2015-08-05 MED ORDER — GUAIFENESIN-DM 100-10 MG/5ML PO SYRP
5.0000 mL | ORAL_SOLUTION | ORAL | Status: DC | PRN
  Administered 2015-08-05: 5 mL via ORAL
  Filled 2015-08-05: qty 5

## 2015-08-05 MED ORDER — ALBUTEROL SULFATE (2.5 MG/3ML) 0.083% IN NEBU
2.5000 mg | INHALATION_SOLUTION | RESPIRATORY_TRACT | Status: DC
  Administered 2015-08-05 – 2015-08-07 (×10): 2.5 mg via RESPIRATORY_TRACT
  Filled 2015-08-05 (×10): qty 3

## 2015-08-05 MED ORDER — HYDROCOD POLST-CPM POLST ER 10-8 MG/5ML PO SUER
5.0000 mL | Freq: Two times a day (BID) | ORAL | Status: DC | PRN
  Administered 2015-08-05 – 2015-08-06 (×2): 5 mL via ORAL
  Filled 2015-08-05 (×2): qty 5

## 2015-08-05 MED ORDER — METHYLPREDNISOLONE SODIUM SUCC 125 MG IJ SOLR
60.0000 mg | INTRAMUSCULAR | Status: DC
  Administered 2015-08-05 – 2015-08-07 (×3): 60 mg via INTRAVENOUS
  Filled 2015-08-05 (×3): qty 2

## 2015-08-05 NOTE — Care Management (Signed)
Discussed need to assess for need of home 02 with primary nurse.

## 2015-08-05 NOTE — Consult Note (Signed)
  Pt seen and examined. Please see Kaitlyn Huff' notes. Pt with constipation and ileus, which may have resulted from colestid use for diarrhea. Would stop colestid. Order serial x-rays. Overall ileus should resolve over time as her respiratory condition resolves and her mobility increases. Will follow. Thanks.

## 2015-08-05 NOTE — Consult Note (Signed)
GI Inpatient Consult Note  Reason for Consult: ileus    Attending Requesting Consult: Dr. Ether Griffins  History of Present Illness: Kaitlyn Huff is a 75 y.o. female seen for evaluation of Ileus at the request of Dr. Doylene Canard. She has been followed by [facility], Dr. Vira Agar and Claudie Leach, PA-C. She reports diarrhea after her CCY in 06/2014. She was having several stools per day. She started on Colestid in 06/2015 which improved her diarrhea. She reports bronchitis and coming into the hospital for SOB awakening her in middle of night. Per chart review on admission had N/V at onset but doesn't recall this today. She is seen w/ her son at bedside.   She reports while inpatient having constipation-no BM since admission. Over past 24 hours feels swollen and bloated. She had dry heaving after lunch when she had a strawberries and cantaloupe. She current feels slight nausea. She denies any sharp abdominal pain. She is passing flatus and this improves her distention. She denies any rectal bleeding. GERD is controlled on Nexium 40mg  qd.    Last Colonoscopy: Dr. Vira Agar - 07/09/15 - tortuous colon somewhat difficult, grade 1 internal hemorrhoids, diverticulum at hepatic flexure, small TA transverse colon.     Last Endoscopy: 07/09/15 - reflux esophagitis, gastritis.  Path-ANTRAL MUCOSA WITH CHANGES CONSISTENT WITH HEALING MUCOSAL INJURY.  - OXYNTIC MUCOSA WITH MILD CHRONIC GASTRITIS, NONSPECIFIC.  - HISTOLOGIC FEATURES CONSISTENT WITH HYPERGASTRINEMIC STATE.  - NEGATIVE FOR H. PYLORI, DYSPLASIA, AND MALIGNANCY.    Past Medical History:  Past Medical History  Diagnosis Date  . Rheumatoid arthritis(714.0)     MTX transaminitis, Leflunomide (rash), enbrel, plaquinil, prednisone, remicade, Imuran (transaminitis)  . Hyperlipidemia   . Diverticulitis   . Osteoarthritis   . GERD (gastroesophageal reflux disease)   . Hypertension   . Anemia   . Positive PPD     s/p INH (2006)  .  Osteoporosis     actonel  . Valvular heart disease     moderate MR and TR    Problem List: Patient Active Problem List   Diagnosis Date Noted  . Non-STEMI (non-ST elevated myocardial infarction) ([place]) 08/03/2015  . Nausea & vomiting 08/03/2015  . Cough 07/30/2015  . History of colonic polyps 07/10/2015  . Swelling, cheek 01/11/2015  . Health care maintenance 01/11/2015  . Pneumonia 11/09/2014  . Right hip pain 09/02/2014  . Diarrhea 09/02/2014  . Hospital discharge follow-up 09/02/2014  . Itching 08/04/2013  . Stress 08/04/2013  . Weight loss 08/04/2013  . Abnormal liver function test 03/26/2013  . Obstructive sleep apnea 12/23/2012  . Dilated bile duct 12/23/2012  . Hypercholesterolemia 08/12/2012  . Rheumatoid arthritis ([place]) 08/12/2012  . GERD (gastroesophageal reflux disease) 08/12/2012  . Hypertension 08/12/2012  . Anemia 08/12/2012    Past Surgical History: Past Surgical History  Procedure Laterality Date  . Abdominal hysterectomy    . Tracheostomy  1959  . Tubal ligation    . Cervical cone biopsy    . [place] surgery    . Cholecystectomy  06/22/14  . Colonoscopy with propofol N/A 07/09/2015    Procedure: COLONOSCOPY WITH PROPOFOL;  Surgeon: Manya Silvas, MD;  Location: [facility] ENDOSCOPY;  Service: Endoscopy;  Laterality: N/A;  . Esophagogastroduodenoscopy (egd) with propofol N/A 07/09/2015    Procedure: ESOPHAGOGASTRODUODENOSCOPY (EGD) WITH PROPOFOL;  Surgeon: Manya Silvas, MD;  Location: [facility] ENDOSCOPY;  Service: Endoscopy;  Laterality: N/A;    Allergies: Allergies  Allergen Reactions  . Astelin [Azelastine Hcl]   .  Ciprofloxacin Other (See Comments)    GI upset  . Codeine Other (See Comments)    Altered mental status  . Dilaudid [Hydromorphone]   . Flexeril [Cyclobenzaprine]   . Imuran [Azathioprine] Other (See Comments)    Abnormal liver function  . Keflex [Cephalexin] Other (See Comments)    GI upset  . Lisinopril Itching  . Lyrica [Pregabalin]  Other (See Comments)    Sore gums  . Methotrexate Derivatives Other (See Comments)    Abnormal liver function  . Amoxicillin Rash  . Arava [Leflunomide] Rash  . Clindamycin/Lincomycin Rash  . Doxycycline Rash  . Lodine [Etodolac] Rash  . Percocet [Oxycodone-Acetaminophen] Rash  . Sulfa Antibiotics Rash    Home Medications: Prescriptions prior to admission  Medication Sig Dispense Refill Last Dose  . ADVAIR DISKUS 250-50 MCG/DOSE AEPB USE 1 INHALATION INTO THE LUNGS EVERY 12 HOURS 180 each 0 08/02/2015 at Unknown time  . albuterol (PROVENTIL HFA;VENTOLIN HFA) 108 (90 Base) MCG/ACT inhaler Inhale 2 puffs into the lungs every 4 (four) hours as needed for wheezing or shortness of breath. 1 Inhaler 2 prn at prn  . ALPRAZolam (XANAX) 0.25 MG tablet 1 tablet q day prn (Patient taking differently: Take 0.25 mg by mouth daily as needed. 1 tablet q day prn) 20 tablet 0 prn at prn  . aspirin 81 MG tablet Take 81 mg by mouth daily.   08/02/2015 at Unknown time  . calcium-vitamin D (OSCAL 500/200 D-3) 500-200 MG-UNIT per tablet Take 1 tablet by mouth 2 (two) times daily.   08/02/2015 at Unknown time  . cetirizine (ZYRTEC) 10 MG tablet TAKE 1 TABLET DAILY 90 tablet 3 08/02/2015 at Unknown time  . colestipol (COLESTID) 1 G tablet Take 2 g by mouth 2 (two) times daily.    08/02/2015 at Unknown time  . FeFum-FePo-FA-B Cmp-C-Zn-Mn-Cu (TANDEM PLUS) 162-115.2-1 MG CAPS TAKE 1 CAPSULE DAILY 90 each 2 08/02/2015 at Unknown time  . furosemide (LASIX) 20 MG tablet TAKE 1 TABLET DAILY 90 tablet 1 08/02/2015 at Unknown time  . gabapentin (NEURONTIN) 300 MG capsule Take 300 mg by mouth 4 (four) times daily.   08/02/2015 at Unknown time  . hydroxychloroquine (PLAQUENIL) 200 MG tablet Take 1 tablet (200 mg total) by mouth 2 (two) times daily. 180 tablet 1 08/02/2015 at Unknown time  . InFLIXimab (REMICADE IV) Inject every 6 weeks   unsure at unsure  . levofloxacin (LEVAQUIN) 750 MG tablet Take 1 tablet (750 mg total) by mouth  daily. 7 tablet 0 08/02/2015 at Unknown time  . metoprolol succinate (TOPROL-XL) 50 MG 24 hr tablet TAKE 1 TABLET (50 MG TOTAL) DAILY. TAKE WITH OR IMMEDIATELY FOLLOWING A MEAL 90 tablet 2 08/02/2015 at Unknown time  . Multiple Vitamin (MULTIVITAMIN) capsule Take 1 capsule by mouth daily.   08/02/2015 at Unknown time  . NEXIUM 40 MG capsule TAKE 1 CAPSULE TWICE A DAY BEFORE A MEAL 180 capsule 2 08/02/2015 at Unknown time  . potassium chloride (MICRO-K) 10 MEQ CR capsule TAKE 2 CAPSULES TWICE A DAY 360 capsule 3 08/02/2015 at Unknown time  . predniSONE (DELTASONE) 5 MG tablet Take 5 mg by mouth 2 (two) times daily with a meal.   08/02/2015 at Unknown time  . simvastatin (ZOCOR) 20 MG tablet TAKE 1 TABLET DAILY 90 tablet 0 08/02/2015 at Unknown time  . sodium chloride (OCEAN) 0.65 % nasal spray Place 1 spray into the nose as needed for congestion.   prn at prn  . traMADol (ULTRAM) 50  MG tablet Take 1 tablet (50 mg total) by mouth at bedtime as needed. (Patient taking differently: Take 50 mg by mouth 2 (two) times daily. ) 30 tablet 0 08/02/2015 at Unknown time   Home medication reconciliation was completed with the patient.   Scheduled Inpatient Medications:   . albuterol  2.5 mg Nebulization Q4H  . aspirin EC  81 mg Oral Daily  . colestipol  1 g Oral Daily  . gabapentin  300 mg Oral QID  . guaiFENesin  600 mg Oral BID  . hydroxychloroquine  200 mg Oral BID  . levofloxacin  750 mg Oral Daily  . methylPREDNISolone (SOLU-MEDROL) injection  60 mg Intravenous Q24H  . metoprolol succinate  50 mg Oral Daily  . mometasone-formoterol  2 puff Inhalation BID  . multivitamin with minerals  1 tablet Oral Daily  . pantoprazole  40 mg Oral Daily  . pramipexole  1 mg Oral QHS  . simvastatin  20 mg Oral Daily  . tiotropium  18 mcg Inhalation Daily    Continuous Inpatient Infusions:     PRN Inpatient Medications:  acetaminophen, ALPRAZolam, chlorpheniramine-HYDROcodone, guaiFENesin-dextromethorphan,  HYDROcodone-acetaminophen, nitroGLYCERIN, ondansetron (ZOFRAN) IV, pramipexole, promethazine, sodium chloride  Family History:  The patient's family history is negative for inflammatory bowel disorders, GI malignancy, or solid organ transplantation.  Social History:  The patient denies ETOH, tobacco, or drug use.   Review of Systems: Constitutional: Weight is stable.  Eyes: No changes in vision. ENT: No oral lesions, sore throat.  GI: see HPI.  Heme/Lymph: No easy bruising.  CV: No chest pain.  GU: No hematuria.  Integumentary: No rashes.  Neuro: No headaches.  Psych: No depression/anxiety.  Endocrine: No heat/cold intolerance.  Allergic/Immunologic: No urticaria.  Resp: No cough, SOB.  Musculoskeletal: No joint swelling.    Physical Examination: BP 123/57 mmHg  Pulse 64  Temp(Src) 98.8 F (37.1 C) (Oral)  Resp 19  Ht 5\' 4"  (1.626 m)  Wt 169 lb (76.658 kg)  BMI 28.99 kg/m2  SpO2 92% Gen: NAD, alert and oriented x 4 HEENT: PEERLA, EOMI, Neck: supple, no JVD or thyromegaly Chest: CTA bilaterally, no wheezes, crackles, or other adventitious sounds CV: RRR, no m/g/c/r Abd: soft, NT, ND, +BS in all four quadrants; no HSM, guarding, ridigity, or rebound tenderness Rectal-no evidence of impaction. No stool in rectal vault.  Ext: no edema, well perfused with 2+ pulses, Skin: no rash or lesions noted Lymph: no LAD  Data: Lab Results  Component Value Date   WBC 4.1 08/05/2015   HGB 10.2* 08/05/2015   HCT 29.7* 08/05/2015   MCV 91.5 08/05/2015   PLT 137* 08/05/2015    Recent Labs Lab 08/03/15 0421 08/04/15 0606 08/05/15 0533  HGB 11.4* 9.8* 10.2*   Lab Results  Component Value Date   NA 135 08/04/2015   K 4.1 08/04/2015   CL 103 08/04/2015   CO2 26 08/04/2015   BUN 10 08/04/2015   CREATININE 0.84 08/04/2015   GLU 115 08/25/2014   Lab Results  Component Value Date   ALT 53 08/03/2015   AST 69* 08/03/2015   ALKPHOS 38 08/03/2015   BILITOT 0.7 08/03/2015     Recent Labs Lab 08/03/15 0425  APTT 27  INR 1.07    IMPRESSION: Mild gaseous distended small bowel loops mid abdomen suspicious for ileus or enteritis. No small bowel air-fluid levels.   Assessment/Plan: Ms. Cheney is a 75 y.o. female admitted for bronchitis and SOB now developing abdominal bloating and constipation.  1. Constipation/ileus - most likely due to medications. Needs to stop Colestid. No evidence of impaction on exam. Passing flatus. Continue anti-emetics PRN. Not overly distended on exam so will hold off on NGT at this time. Will order a repeat film for tomorrow morning. Needs clear liquids today. If improved can advance to full liquids tomorrow.    2. GERD - continue pantoprazole.   Recommendations:.  1. Clear liquid diet.  2. Abdominal xray tomorrow AM 3. Anti-emetics PRN 4. Stop Colestid  Case discussed w/ Dr. Candace Cruise.    Thank you for the consult. Please call with questions or concerns.  Ronney Asters, PA-C [place]

## 2015-08-05 NOTE — Progress Notes (Signed)
ANTICOAGULATION CONSULT NOTE - Follow Up Consult  Pharmacy Consult for Heparin drip Indication: chest pain/ACS  Allergies  Allergen Reactions  . Astelin [Azelastine Hcl]   . Ciprofloxacin Other (See Comments)    GI upset  . Codeine Other (See Comments)    Altered mental status  . Dilaudid [Hydromorphone]   . Flexeril [Cyclobenzaprine]   . Imuran [Azathioprine] Other (See Comments)    Abnormal liver function  . Keflex [Cephalexin] Other (See Comments)    GI upset  . Lisinopril Itching  . Lyrica [Pregabalin] Other (See Comments)    Sore gums  . Methotrexate Derivatives Other (See Comments)    Abnormal liver function  . Amoxicillin Rash  . Arava [Leflunomide] Rash  . Clindamycin/Lincomycin Rash  . Doxycycline Rash  . Lodine [Etodolac] Rash  . Percocet [Oxycodone-Acetaminophen] Rash  . Sulfa Antibiotics Rash    Patient Measurements: Height: 5\' 4"  (162.6 cm) Weight: 169 lb (76.658 kg) IBW/kg (Calculated) : 54.7 Heparin Dosing Weight: 70.9 kg  Vital Signs: Temp: 98.1 F (36.7 C) (01/25 0537) Temp Source: Oral (01/25 0537) BP: 128/63 mmHg (01/25 0537) Pulse Rate: 69 (01/25 0537)  Labs:  Recent Labs  08/03/15 0421 08/03/15 0425 08/03/15 1059  08/03/15 1523 08/03/15 2136 08/04/15 0606 08/05/15 0533  HGB 11.4*  --   --   --   --   --  9.8* 10.2*  HCT 32.5*  --   --   --   --   --  28.8* 29.7*  PLT 138*  --   --   --   --   --  128* 137*  APTT  --  27  --   --   --   --   --   --   LABPROT  --  14.1  --   --   --   --   --   --   INR  --  1.07  --   --   --   --   --   --   HEPARINUNFRC  --   --   --   < > 0.46 0.62 0.60 <0.10*  CREATININE 1.01*  --   --   --   --   --  0.84  --   TROPONINI 0.15*  --  0.14*  --  0.13* 0.12*  --   --   < > = values in this interval not displayed.  Estimated Creatinine Clearance: 60.7 mL/min (by C-G formula based on Cr of 0.84).   Medications:  Infusions:     Assessment: Patient currently ordered Heparin drip at 850  units/hr for NSTEMI.  1/24 0600 HL resulted at 0.60  Goal of Therapy:  Heparin level 0.3-0.7 units/ml Monitor platelets by anticoagulation protocol: Yes   Plan:  Will continue at current rate. Next Heparin level and CBC to be checked with 1/25 AM labs  1/25 AM heparin level <0.1. Drip d/c by MD yesterday.    Paulina Fusi, PharmD, BCPS 08/05/2015 6:45 AM

## 2015-08-05 NOTE — Progress Notes (Signed)
Kaitlyn Huff at [place] NAME: Kaitlyn Huff    MR#:  [id number]  DATE OF BIRTH:  05/30/1941  SUBJECTIVE:  CHIEF COMPLAINT:   Chief Complaint  Patient presents with  . Chills  . Shortness of Breath  . Pneumonia   patient is 75 year old female with past medical history significant for history of remote as a fasciitis, hyperlipidemia, osteoarthritis, hypertension, valvular heart disease who presents to the hospital with complaints of nausea, vomiting and shortness of breath. Patient was initiated on levofloxacin 4 days ago prior by primary care physician for pneumonia, however, did not improve and presented to the hospital with shortness of breath. She had no chest pains. EKG was remarkable for no significant ST-T changes. Patient's troponin was found to be elevated at 0.15 on the first set declining afterwards. Chest x-ray showed peribronchial thickening, mild vascular congestion. Patient was initiated on antibiotics intravenously and admitted to the hospital for further evaluation and treatment. She feels satisfactory today, however, weak and still does not feel that she improved a lot. She remains on oxygen at 2 L of oxygen with satisfactory O2 sats. Nausea and vomiting resolved and she eats 100% of offered meals .  Patient admits of cough and shortness of breath as well as wheezing. Denies any smoking history, but admits of living with person who smoked for 40 years. Blood cultures are negative 2 Still feels weak and has been coughing paroxysmally, requesting some cough medications, still wheezing intermittently Review of Systems  Constitutional: Negative for fever, chills and weight loss.  HENT: Negative for congestion.   Eyes: Negative for blurred vision and double vision.  Respiratory: Positive for cough, shortness of breath and wheezing. Negative for sputum production.   Cardiovascular: Negative for chest pain, palpitations, orthopnea, leg  swelling and PND.  Gastrointestinal: Negative for nausea, vomiting, abdominal pain, diarrhea, constipation and blood in stool.  Genitourinary: Negative for dysuria, urgency, frequency and hematuria.  Musculoskeletal: Negative for falls.  Neurological: Negative for dizziness, tremors, focal weakness and headaches.  Endo/Heme/Allergies: Does not bruise/bleed easily.  Psychiatric/Behavioral: Negative for depression. The patient does not have insomnia.     VITAL SIGNS: Blood pressure 123/57, pulse 64, temperature 98.8 F (37.1 C), temperature source Oral, resp. rate 19, height 5\' 4"  (1.626 m), weight 76.658 kg (169 lb), SpO2 92 %.  PHYSICAL EXAMINATION:   GENERAL:  75 y.o.-year-old patient lying in the bed with no acute distress.  EYES: Pupils equal, round, reactive to light and accommodation. No scleral icterus. Extraocular muscles intact.  HEENT: Head atraumatic, normocephalic. Oropharynx and nasopharynx clear.  NECK:  Supple, no jugular venous distention. No thyroid enlargement, no tenderness.  LUNGS: Diminished breath sounds bilaterally, scattered  wheezing, especially left base. Intermittently using accessory muscles of respiration, especially on exertion CARDIOVASCULAR: S1, S2 normal. No murmurs, rubs, or gallops.  ABDOMEN: Soft, nontender, nondistended. Bowel sounds present. No organomegaly or mass.  EXTREMITIES: No pedal edema, cyanosis, or clubbing. Mild ankle edema, mostly on the right side NEUROLOGIC: Cranial nerves II through XII are intact. Muscle strength 5/5 in all extremities. Sensation intact. Gait not checked.  PSYCHIATRIC: The patient is alert and oriented x 3.  SKIN: No obvious rash, lesion, or ulcer.   ORDERS/RESULTS REVIEWED:   CBC  Recent Labs Lab 08/03/15 0421 08/04/15 0606 08/05/15 0533  WBC 5.0 2.9* 4.1  HGB 11.4* 9.8* 10.2*  HCT 32.5* 28.8* 29.7*  PLT 138* 128* 137*  MCV 90.5 90.2 91.5  MCH 31.8 30.5  31.4  MCHC 35.1 33.8 34.3  RDW 13.0 13.0 13.2    ------------------------------------------------------------------------------------------------------------------  Chemistries   Recent Labs Lab 08/03/15 0421 08/04/15 0606  NA 131* 135  K 3.5 4.1  CL 96* 103  CO2 23 26  GLUCOSE 93 116*  BUN 11 10  CREATININE 1.01* 0.84  CALCIUM 8.2* 8.1*  AST 69*  --   ALT 53  --   ALKPHOS 38  --   BILITOT 0.7  --    ------------------------------------------------------------------------------------------------------------------ estimated creatinine clearance is 60.7 mL/min (by C-G formula based on Cr of 0.84). ------------------------------------------------------------------------------------------------------------------ No results for input(s): TSH, T4TOTAL, T3FREE, THYROIDAB in the last 72 hours.  Invalid input(s): FREET3  Cardiac Enzymes  Recent Labs Lab 08/03/15 1059 08/03/15 1523 08/03/15 2136  TROPONINI 0.14* 0.13* 0.12*   ------------------------------------------------------------------------------------------------------------------ Invalid input(s): POCBNP ---------------------------------------------------------------------------------------------------------------  RADIOLOGY: Dg Abd 1 View  08/05/2015  CLINICAL DATA:  Nausea and vomiting EXAM: ABDOMEN - 1 VIEW COMPARISON:  08/25/2014 FINDINGS: Mild gaseous distended small bowel loops in mid abdomen suspicious for ileus or enteritis. No small bowel air-fluid levels are noted. IMPRESSION: Mild gaseous distended small bowel loops mid abdomen suspicious for ileus or enteritis. No small bowel air-fluid levels. Electronically Signed   By: Lahoma Crocker M.D.   On: 08/05/2015 12:53    EKG:  Orders placed or performed during the hospital encounter of 08/03/15  . EKG 12-Lead  . EKG 12-Lead  . ED EKG  . ED EKG    ASSESSMENT AND PLAN:  Principal Problem:   Non-STEMI (non-ST elevated myocardial infarction) (HCC) Active Problems:   Nausea & vomiting 1. Dyspnea,  multifactorial likely due to acute diastolic congestive heart failure exacerbation, also COPD exacerbation,  Lasix intermittently. Patient has been wheezing more today despite diuretic therapy , continue oxygen therapy as needed, normal echocardiogram 2. COPD exacerbation, resume steroids, continue inhalation therapy was with budesonide, albuterol and tiotropium,  some better clinically. Continue oxygen therapy and wean off oxygen as tolerated 3. Acute bronchitis, get sputum cultures if possible, continue patient on Humibid, levofloxacin, adding Robitussin and Tussionex to help with dry cough 4. Elevated troponin, likely demand ischemia, normal echocardiogram , appreciate cardiology input , stress test revealed a normal myocardial perfusion without evidence of myocardial ischemia 5. Hyponatremia, resolved 6. Renal insufficiency, resolved with IV fluid administration, unremarkable urinalysis,  follow  in the morning 7. Anemia. Get Hemoccult, continue aspirin 8. Thrombocytopenia, worse with heparin infusion, discontinued heparin, and improved platelet count today   Management plans discussed with the patient, family and they are in agreement.   DRUG ALLERGIES:  Allergies  Allergen Reactions  . Astelin [Azelastine Hcl]   . Ciprofloxacin Other (See Comments)    GI upset  . Codeine Other (See Comments)    Altered mental status  . Dilaudid [Hydromorphone]   . Flexeril [Cyclobenzaprine]   . Imuran [Azathioprine] Other (See Comments)    Abnormal liver function  . Keflex [Cephalexin] Other (See Comments)    GI upset  . Lisinopril Itching  . Lyrica [Pregabalin] Other (See Comments)    Sore gums  . Methotrexate Derivatives Other (See Comments)    Abnormal liver function  . Amoxicillin Rash  . Arava [Leflunomide] Rash  . Clindamycin/Lincomycin Rash  . Doxycycline Rash  . Lodine [Etodolac] Rash  . Percocet [Oxycodone-Acetaminophen] Rash  . Sulfa Antibiotics Rash    CODE STATUS:      Code Status Orders        Start     Ordered   08/03/15  [id number]  Full code   Continuous     08/03/15 0949    Code Status History    Date Active Date Inactive Code Status Order ID Comments User Context   This patient has a current code status but no historical code status.      TOTAL TIME TAKING CARE OF THIS PATIENT: 40  minutes.  Discussed with patient's family  Antonique Langford M.D on 08/05/2015 at 5:36 PM  Between 7am to 6pm - Pager - [phone]  After 6pm go to [email or web address] - password [email or web address] [facility]  Office  [phone]  CC: Primary care physician; [name], MD

## 2015-08-05 NOTE — Progress Notes (Signed)
Dr. Clayton Bibles updated on DG abd results.

## 2015-08-05 NOTE — Progress Notes (Signed)
Patient continues to complain of cough unrelieved with current medication; also continues to have nausea accompanied with vomiting. Dr. Ether Griffins rounding and made aware; to place orders to PRN medication and abdominal xray.

## 2015-08-05 NOTE — Progress Notes (Signed)
Telemetry dc'd per md order. Tele box returned to tele clerk.

## 2015-08-05 NOTE — Progress Notes (Signed)
[facility] Encounter Note  Patient: Kaitlyn Huff / Admit Date: 08/03/2015 / Date of Encounter: 08/05/2015, 5:34 PM   Subjective: Breathing is much better at this time without evidence of significant cough congestion and/or chest pain  Review of Systems: Positive for: Shortness of breath with activity Negative for: Vision change, hearing change, syncope, dizziness, nausea, vomiting,diarrhea, bloody stool, stomach pain, cough, congestion, diaphoresis, urinary frequency, urinary pain,skin lesions, skin rashes Others previously listed  Objective: Telemetry: Normal sinus rhythm Physical Exam: Blood pressure 123/57, pulse 64, temperature 98.8 F (37.1 C), temperature source Oral, resp. rate 19, height 5\' 4"  (1.626 m), weight 169 lb (76.658 kg), SpO2 92 %. Body mass index is 28.99 kg/(m^2). General: Well developed, well nourished, in no acute distress. Head: Normocephalic, atraumatic, sclera non-icteric, no xanthomas, nares are without discharge. Neck: No apparent masses Lungs: Normal respirations with few wheezes, no rhonchi, no rales , basilar crackles   Heart: Regular rate and rhythm, normal S1 S2, no murmur, no rub, no gallop, PMI is normal size and placement, carotid upstroke normal without bruit, jugular venous pressure normal Abdomen: Soft, non-tender, non-distended with normoactive bowel sounds. No hepatosplenomegaly. Abdominal aorta is normal size without bruit Extremities: No edema, no clubbing, no cyanosis, no ulcers,  Peripheral: 2+ radial, 2+ femoral, 2+ dorsal pedal pulses Neuro: Alert and oriented. Moves all extremities spontaneously. Psych:  Responds to questions appropriately with a normal affect.   Intake/Output Summary (Last 24 hours) at 08/05/15 1734 Last data filed at 08/05/15 1715  Gross per 24 hour  Intake    240 ml  Output   2500 ml  Net  -2260 ml    Inpatient Medications:  . albuterol  2.5 mg Nebulization Q4H  . aspirin EC  81 mg Oral  Daily  . gabapentin  300 mg Oral QID  . guaiFENesin  600 mg Oral BID  . hydroxychloroquine  200 mg Oral BID  . levofloxacin  750 mg Oral Daily  . methylPREDNISolone (SOLU-MEDROL) injection  60 mg Intravenous Q24H  . metoprolol succinate  50 mg Oral Daily  . mometasone-formoterol  2 puff Inhalation BID  . multivitamin with minerals  1 tablet Oral Daily  . pantoprazole  40 mg Oral Daily  . pramipexole  1 mg Oral QHS  . simvastatin  20 mg Oral Daily  . tiotropium  18 mcg Inhalation Daily   Infusions:     Labs:  Recent Labs  08/03/15 0421 08/04/15 0606  NA 131* 135  K 3.5 4.1  CL 96* 103  CO2 23 26  GLUCOSE 93 116*  BUN 11 10  CREATININE 1.01* 0.84  CALCIUM 8.2* 8.1*    Recent Labs  08/03/15 0421  AST 69*  ALT 53  ALKPHOS 38  BILITOT 0.7  PROT 6.7  ALBUMIN 3.6    Recent Labs  08/04/15 0606 08/05/15 0533  WBC 2.9* 4.1  HGB 9.8* 10.2*  HCT 28.8* 29.7*  MCV 90.2 91.5  PLT 128* 137*    Recent Labs  08/03/15 0421 08/03/15 1059 08/03/15 1523 08/03/15 2136  TROPONINI 0.15* 0.14* 0.13* 0.12*   Invalid input(s): POCBNP No results for input(s): HGBA1C in the last 72 hours.   Weights: Filed Weights   08/03/15 0331  Weight: 169 lb (76.658 kg)     Radiology/Studies:  Dg Chest 2 View  08/03/2015  CLINICAL DATA:  Acute onset of shortness of breath, dry cough, generalized weakness and dizziness. Initial encounter. EXAM: CHEST  2 VIEW COMPARISON:  Chest radiograph  performed 07/30/2015 FINDINGS: The lungs are well-aerated. Peribronchial thickening is noted. Mild vascular congestion is seen. There is no evidence of focal opacification, pleural effusion or pneumothorax. The heart is borderline normal in size. Right paratracheal prominence is stable and likely reflects normal vasculature. No acute osseous abnormalities are seen. Clips are noted within the right upper quadrant, reflecting prior cholecystectomy. IMPRESSION: Peribronchial thickening noted.  Mild  vascular congestion seen. Electronically Signed   By: Garald Balding M.D.   On: 08/03/2015 04:41   Dg Chest 2 View  07/30/2015  CLINICAL DATA:  Cough and shortness of breath for 2 days. EXAM: CHEST  2 VIEW COMPARISON:  January 14, 2015 FINDINGS: The mediastinal contour is normal. Heart size is mildly enlarged. Both lungs are clear. The visualized skeletal structures are stable. IMPRESSION: No active cardiopulmonary disease. Electronically Signed   By: Abelardo Diesel M.D.   On: 07/30/2015 16:19   Dg Abd 1 View  08/05/2015  CLINICAL DATA:  Nausea and vomiting EXAM: ABDOMEN - 1 VIEW COMPARISON:  08/25/2014 FINDINGS: Mild gaseous distended small bowel loops in mid abdomen suspicious for ileus or enteritis. No small bowel air-fluid levels are noted. IMPRESSION: Mild gaseous distended small bowel loops mid abdomen suspicious for ileus or enteritis. No small bowel air-fluid levels. Electronically Signed   By: Lahoma Crocker M.D.   On: 08/05/2015 12:53     Assessment and Recommendation  75 y.o. female with known essential hypertension mitral valve insufficiency hyperlipidemia and elevated troponin consistent with demand ischemia without evidence of myocardial infarction Echocardiogram shows normal LV systolic function contraction fraction greater than 55% with moderate mitral regurgitation Stress test shows normal myocardial perfusion without evidence of myocardial ischemia 1. Continue treatment of bronchitis and or infection necessary 2. No further cardiac diagnostics necessary at this time  3. Continue hypertension control with metoprolol 4. Lasix as needed for occasional pulmonary edema and/or viral involvement 5. Begin ambulation and follow for improvements of symptoms 6. Okay for discharge to home from cardiac standpoint with follow-up in one to 2 weeks Signed, Serafina Royals M.D. FACC

## 2015-08-05 NOTE — Care Management Important Message (Signed)
Important Message  Patient Details  Name: Kaitlyn Huff MRN: [id number] Date of Birth: [birth year]   Medicare Important Message Given:  Yes    [name] [place] Mom 08/05/2015, 2:56 PM

## 2015-08-06 ENCOUNTER — Inpatient Hospital Stay: Payer: Medicare Other

## 2015-08-06 ENCOUNTER — Encounter: Payer: Self-pay | Admitting: Radiology

## 2015-08-06 DIAGNOSIS — E871 Hypo-osmolality and hyponatremia: Secondary | ICD-10-CM

## 2015-08-06 DIAGNOSIS — J441 Chronic obstructive pulmonary disease with (acute) exacerbation: Secondary | ICD-10-CM | POA: Insufficient documentation

## 2015-08-06 DIAGNOSIS — D61818 Other pancytopenia: Secondary | ICD-10-CM

## 2015-08-06 DIAGNOSIS — K567 Ileus, unspecified: Secondary | ICD-10-CM

## 2015-08-06 DIAGNOSIS — J9601 Acute respiratory failure with hypoxia: Secondary | ICD-10-CM

## 2015-08-06 MED ORDER — MAGNESIUM HYDROXIDE 400 MG/5ML PO SUSP
15.0000 mL | Freq: Every day | ORAL | Status: DC | PRN
  Administered 2015-08-06: 15 mL via ORAL
  Filled 2015-08-06: qty 30

## 2015-08-06 MED ORDER — IOHEXOL 350 MG/ML SOLN
80.0000 mL | Freq: Once | INTRAVENOUS | Status: AC | PRN
  Administered 2015-08-06: 80 mL via INTRAVENOUS

## 2015-08-06 MED ORDER — FUROSEMIDE 20 MG PO TABS
20.0000 mg | ORAL_TABLET | Freq: Once | ORAL | Status: AC
  Administered 2015-08-06: 20 mg via ORAL
  Filled 2015-08-06: qty 1

## 2015-08-06 MED ORDER — POTASSIUM CHLORIDE CRYS ER 20 MEQ PO TBCR
20.0000 meq | EXTENDED_RELEASE_TABLET | Freq: Once | ORAL | Status: AC
  Administered 2015-08-06: 20 meq via ORAL
  Filled 2015-08-06: qty 1

## 2015-08-06 NOTE — Care Management (Signed)
Patient has qualified for home 02.  Attending to add incentive spirometry.  Patient is currently on scheduled  albuterol neb treatments.  She does not have home nebulizer machine.  Patient becomes agitated when discussing need for home  health, being admitted, not discharging today.  Patient lives with her daughter.  There seems to be some discord between patient's adult children.  Discussed at length with patient the concerns of  low 02 sats at complete rest. and how home health nurse and physical therapy can follow 02 sats and attempt to wean from 02.  Patient does not have agency choice for dme or discipline services.   DME to [facility].  Will ask attending about possibility of home nebulizer machine.  Daughter say that patient does have a diagnosis of COPD

## 2015-08-06 NOTE — Consult Note (Signed)
  GI Inpatient Follow-up Note  Patient Identification: Kaitlyn Huff is a 75 y.o. female  Subjective: Passing gas. No BM yesterday. However, KUB showed resolution of ileus. Just had solid lunch. So far, no problems. Scheduled Inpatient Medications:  . albuterol  2.5 mg Nebulization Q4H  . aspirin EC  81 mg Oral Daily  . furosemide  20 mg Oral Once  . gabapentin  300 mg Oral QID  . guaiFENesin  600 mg Oral BID  . hydroxychloroquine  200 mg Oral BID  . levofloxacin  750 mg Oral Daily  . methylPREDNISolone (SOLU-MEDROL) injection  60 mg Intravenous Q24H  . metoprolol succinate  50 mg Oral Daily  . mometasone-formoterol  2 puff Inhalation BID  . multivitamin with minerals  1 tablet Oral Daily  . pantoprazole  40 mg Oral Daily  . potassium chloride  20 mEq Oral Once  . pramipexole  1 mg Oral QHS  . simvastatin  20 mg Oral Daily  . tiotropium  18 mcg Inhalation Daily    Continuous Inpatient Infusions:     PRN Inpatient Medications:  acetaminophen, ALPRAZolam, chlorpheniramine-HYDROcodone, guaiFENesin-dextromethorphan, HYDROcodone-acetaminophen, magnesium hydroxide, nitroGLYCERIN, ondansetron (ZOFRAN) IV, pramipexole, promethazine, sodium chloride  Review of Systems: Constitutional: Weight is stable.  Eyes: No changes in vision. ENT: No oral lesions, sore throat.  GI: see HPI.  Heme/Lymph: No easy bruising.  CV: No chest pain.  GU: No hematuria.  Integumentary: No rashes.  Neuro: No headaches.  Psych: No depression/anxiety.  Endocrine: No heat/cold intolerance.  Allergic/Immunologic: No urticaria.  Resp: No cough, SOB.  Musculoskeletal: No joint swelling.    Physical Examination: BP 116/60 mmHg  Pulse 63  Temp(Src) 98.2 F (36.8 C) (Oral)  Resp 18  Ht 5\' 4"  (1.626 m)  Wt 76.658 kg (169 lb)  BMI 28.99 kg/m2  SpO2 93% Gen: NAD, alert and oriented x 4 HEENT: PEERLA, EOMI, Neck: supple, no JVD or thyromegaly Chest: CTA bilaterally, no wheezes, crackles, or other  adventitious sounds CV: RRR, no m/g/c/r Abd: soft, NT, ND, +BS in all four quadrants; no HSM, guarding, ridigity, or rebound tenderness Ext: no edema, well perfused with 2+ pulses, Skin: no rash or lesions noted Lymph: no LAD  Data: Lab Results  Component Value Date   WBC 4.1 08/05/2015   HGB 10.2* 08/05/2015   HCT 29.7* 08/05/2015   MCV 91.5 08/05/2015   PLT 137* 08/05/2015    Recent Labs Lab 08/03/15 0421 08/04/15 0606 08/05/15 0533  HGB 11.4* 9.8* 10.2*   Lab Results  Component Value Date   NA 135 08/04/2015   K 4.1 08/04/2015   CL 103 08/04/2015   CO2 26 08/04/2015   BUN 10 08/04/2015   CREATININE 0.84 08/04/2015   GLU 115 08/25/2014   Lab Results  Component Value Date   ALT 53 08/03/2015   AST 69* 08/03/2015   ALKPHOS 38 08/03/2015   BILITOT 0.7 08/03/2015    Recent Labs Lab 08/03/15 0425  APTT 27  INR 1.07   Assessment/Plan: [name] is a 75 y.o. female with ileus. Resolved.   Recommendations: Continue to hold colestid. Ok to continue align. Keep appt with Dr. Vira Agar. If diarrhea returns, may need to resume colestid prn. Will sign off. Thanks. Please call with questions or concerns.  Nykole Matos, Lupita Dawn, MD

## 2015-08-06 NOTE — Progress Notes (Signed)
SATURATION QUALIFICATIONS: (This note is used to comply with regulatory documentation for home oxygen)  Patient Saturations on Room Air at Rest = 86%  Patient Saturations on Room Air while Ambulating = n/a%  Patient Saturations on 2 Liters of oxygen while Ambulating = 93%  Please briefly explain why patient needs home oxygen: 

## 2015-08-06 NOTE — Progress Notes (Signed)
[facility] [place] NAME: Kaitlyn Huff    MR#:  [id number]  DATE OF BIRTH:  May 16, 1941  SUBJECTIVE:  CHIEF COMPLAINT:   Chief Complaint  Patient presents with  . Chills  . Shortness of Breath  . Pneumonia   patient is 75 year old female with past medical history significant for history of remote as a fasciitis, hyperlipidemia, osteoarthritis, hypertension, valvular heart disease who presents to the hospital with complaints of nausea, vomiting and shortness of breath. Patient was initiated on levofloxacin 4 days ago prior by primary care physician for pneumonia, however, did not improve and presented to the hospital with shortness of breath. She had no chest pains. EKG was remarkable for no significant ST-T changes. Patient's troponin was found to be elevated at 0.15 on the first set declining afterwards. Chest x-ray showed peribronchial thickening, mild vascular congestion. Patient was initiated on antibiotics intravenously and admitted to the hospital for further evaluation and treatment. She feels satisfactory today, however, weak and still does not feel that she improved a lot. She remains on oxygen at 2 L of oxygen with satisfactory O2 sats. Nausea and vomiting resolved and she eats 100% of offered meals .  Patient admits of cough and shortness of breath as well as wheezing. Denies any smoking history, but admits of living with person who smoked for 40 years. Blood cultures are negative 2 Patient feels good today. Denies any significant shortness of breath, cough has decreased, wheezing has decreased. Remains hypoxic on room air with oxygen saturations in 80s. CT angiogram reveals no emphysema, but chronic bronchitis with bronchiectasis, likely COPD with chronic bronchitis, qualifying her for oxygen therapy at home. Incentive . Spirometry is initiated  Review of Systems  Constitutional: Negative for fever, chills and weight loss.  HENT:  Negative for congestion.   Eyes: Negative for blurred vision and double vision.  Respiratory: Positive for cough, shortness of breath and wheezing. Negative for sputum production.   Cardiovascular: Negative for chest pain, palpitations, orthopnea, leg swelling and PND.  Gastrointestinal: Negative for nausea, vomiting, abdominal pain, diarrhea, constipation and blood in stool.  Genitourinary: Negative for dysuria, urgency, frequency and hematuria.  Musculoskeletal: Negative for falls.  Neurological: Negative for dizziness, tremors, focal weakness and headaches.  Endo/Heme/Allergies: Does not bruise/bleed easily.  Psychiatric/Behavioral: Negative for depression. The patient does not have insomnia.     VITAL SIGNS: Blood pressure 116/60, pulse 63, temperature 98.2 F (36.8 C), temperature source Oral, resp. rate 18, height 5\' 4"  (1.626 m), weight 76.658 kg (169 lb), SpO2 93 %.  PHYSICAL EXAMINATION:   GENERAL:  75 y.o.-year-old patient lying in the bed with no acute distress. Comfortable and smiling EYES: Pupils equal, round, reactive to light and accommodation. No scleral icterus. Extraocular muscles intact.  HEENT: Head atraumatic, normocephalic. Oropharynx and nasopharynx clear.  NECK:  Supple, no jugular venous distention. No thyroid enlargement, no tenderness.  LUNGS: Better air entrance bilaterally, no more wheezing,. Not using accessory muscles of respiration CARDIOVASCULAR: S1, S2 normal. No murmurs, rubs, or gallops.  ABDOMEN: Soft, nontender, nondistended. Bowel sounds present. No organomegaly or mass.  EXTREMITIES: No pedal edema, cyanosis, or clubbing. Mild ankle edema, mostly on the right side NEUROLOGIC: Cranial nerves II through XII are intact. Muscle strength 5/5 in all extremities. Sensation intact. Gait not checked.  PSYCHIATRIC: The patient is alert and oriented x 3.  SKIN: No obvious rash, lesion, or ulcer.   ORDERS/RESULTS REVIEWED:   CBC  Recent Labs Lab  08/03/15 0421 08/04/15 0606 08/05/15 0533  WBC 5.0 2.9* 4.1  HGB 11.4* 9.8* 10.2*  HCT 32.5* 28.8* 29.7*  PLT 138* 128* 137*  MCV 90.5 90.2 91.5  MCH 31.8 30.5 31.4  MCHC 35.1 33.8 34.3  RDW 13.0 13.0 13.2   ------------------------------------------------------------------------------------------------------------------  Chemistries   Recent Labs Lab 08/03/15 0421 08/04/15 0606  NA 131* 135  K 3.5 4.1  CL 96* 103  CO2 23 26  GLUCOSE 93 116*  BUN 11 10  CREATININE 1.01* 0.84  CALCIUM 8.2* 8.1*  AST 69*  --   ALT 53  --   ALKPHOS 38  --   BILITOT 0.7  --    ------------------------------------------------------------------------------------------------------------------ estimated creatinine clearance is 60.7 mL/min (by C-G formula based on Cr of 0.84). ------------------------------------------------------------------------------------------------------------------ No results for input(s): TSH, T4TOTAL, T3FREE, THYROIDAB in the last 72 hours.  Invalid input(s): FREET3  Cardiac Enzymes  Recent Labs Lab 08/03/15 1059 08/03/15 1523 08/03/15 2136  TROPONINI 0.14* 0.13* 0.12*   ------------------------------------------------------------------------------------------------------------------ Invalid input(s): POCBNP ---------------------------------------------------------------------------------------------------------------  RADIOLOGY: Dg Abd 1 View  08/06/2015  CLINICAL DATA:  Admitted 3 days ago for nausea and vomiting ; suspected ileus or enteritis EXAM: ABDOMEN - 1 VIEW COMPARISON:  Abdominal film of August 05, 2015 FINDINGS: The bowel gas pattern is within the limits of normal. There is no evidence of ileus or obstruction. There is gas in the rectum. There are no free extraluminal gas collections. The lung bases are clear. There surgical clips in the gallbladder fossa. The bony structures exhibit no acute abnormalities. Stable phlebolith or arterial  calcification in the left aspect of the pelvis. IMPRESSION: No acute intra-abdominal abnormality is demonstrated on today's study. Electronically Signed   By: David  Martinique M.D.   On: 08/06/2015 08:24   Dg Abd 1 View  08/05/2015  CLINICAL DATA:  Nausea and vomiting EXAM: ABDOMEN - 1 VIEW COMPARISON:  08/25/2014 FINDINGS: Mild gaseous distended small bowel loops in mid abdomen suspicious for ileus or enteritis. No small bowel air-fluid levels are noted. IMPRESSION: Mild gaseous distended small bowel loops mid abdomen suspicious for ileus or enteritis. No small bowel air-fluid levels. Electronically Signed   By: Lahoma Crocker M.D.   On: 08/05/2015 12:53   Ct Angio Chest Pe W/cm &/or Wo Cm  08/06/2015  CLINICAL DATA:  75 year old female with 3 month history of shortness breath and intermittent cough over the last several months. Recent diagnosis of pneumonia. EXAM: CT ANGIOGRAPHY CHEST WITH CONTRAST TECHNIQUE: Multidetector CT imaging of the chest was performed using the standard protocol during bolus administration of intravenous contrast. Multiplanar CT image reconstructions and MIPs were obtained to evaluate the vascular anatomy. CONTRAST:  60mL OMNIPAQUE IOHEXOL 350 MG/ML SOLN COMPARISON:  Nuclear medicine myocardial perfusion study 08/04/2015; prior chest x-ray 08/03/2015 FINDINGS: Mediastinum: 10 mm hypoechoic nodule may anterior aspect of the left thyroid gland. Incidentally detected thyroid nodules less than 15 mm in size requiring no specific imaging follow-up. The remainder of the thoracic inlet is unremarkable. Small calcified mediastinal and bi hilar lymph nodes. No suspicious adenopathy or mediastinal mass. There is a small sliding hiatal hernia. Heart/Vascular: 2 vessel aortic arch. The left common carotid and right brachiocephalic arteries share a common origin. There is a retropharyngeal course of the bilateral carotid arteries. Normal caliber thoracic aorta. No evidence of dissection. Adequate  opacification of the pulmonary arteries to the proximal subsegmental level. No evidence of central filling defect to suggest acute pulmonary embolus. Borderline cardiomegaly. No pericardial effusion. Calcifications along the course  of the left anterior descending, left main and right coronary arteries. Lungs/Pleura: Diffuse mild bronchial wall thickening. Minimal dependent atelectasis. There are a few scattered calcified granulomas. No suspicious pulmonary nodule or mass. Focal atelectasis with bronchiolectasis in the inferior aspect of the lingula. Bones/Soft Tissues: No acute fracture or aggressive appearing lytic or blastic osseous lesion. 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 This is essentially unchanged compared to prior imaging from December of 2015 confirming 2 year stability. Surgical changes of prior cholecystectomy. Otherwise, the upper abdomen is unremarkable. Review of the MIP images confirms the above findings. IMPRESSION: 1. Negative for acute pulmonary embolus, pneumonia or other acute cardiopulmonary process. 2. Diffuse mild bronchial wall thickening may represent either acute or chronic bronchitis. 3. Coronary artery calcifications. 4. Small hiatal hernia. 5. 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 6. 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 Electronically Signed   By: Jacqulynn Cadet M.D.   On: 08/06/2015 13:10    EKG:  Orders placed or performed during the hospital encounter of 08/03/15  . EKG 12-Lead  . EKG 12-Lead  . ED EKG  . ED EKG    ASSESSMENT AND PLAN:  Principal Problem:   Acute respiratory failure with hypoxia (HCC) Active Problems:   COPD exacerbation (HCC)   Elevated troponin   Nausea & vomiting   Acute bronchitis   Hyponatremia   Pancytopenia (HCC)   Ileus (HCC) 1.  Dyspnea, multifactorial likely due to acute diastolic congestive heart failure exacerbation, also COPD/chronic bronchitis exacerbation, give one more dose of Lasix today, initiate incentive spirometry. Continue nebulizing therapy, continue oxygen therapy as needed, normal echocardiogram 2. COPD/chronic bronchitis with bronchiectasis exacerbation, taper steroids, continue inhalation therapy was with budesonide, albuterol and tiotropium,  better clinically. Weaning off oxygen therapy , but may require oxygen therapy at home. Recheck O2 sats again tomorrow on exertion 3. Chronic bronchitis exacerbation, sputum cultures not reported, continue patient on Humibid, levofloxacin, continue Robitussin and Tussionex to help with dry cough, improved 4. Elevated troponin, likely demand ischemia, normal echocardiogram , appreciate cardiology input , stress test revealed a normal myocardial perfusion without evidence of myocardial ischemia 5. Hyponatremia, resolved 6. Renal insufficiency, resolved with IV fluid administration, unremarkable urinalysis,  follow  in the morning with diuretic 7. Anemia. Get Hemoccult, continue aspirin 8. Thrombocytopenia, worse with heparin infusion, discontinued heparin, and improved platelet count    Management plans discussed with the patient, family and they are in agreement.   DRUG ALLERGIES:  Allergies  Allergen Reactions  . Astelin [Azelastine Hcl]   . Ciprofloxacin Other (See Comments)    GI upset  . Codeine Other (See Comments)    Altered mental status  . Dilaudid [Hydromorphone]   . Flexeril [Cyclobenzaprine]   . Imuran [Azathioprine] Other (See Comments)    Abnormal liver function  . Keflex [Cephalexin] Other (See Comments)    GI upset  . Lisinopril Itching  . Lyrica [Pregabalin] Other (See Comments)    Sore gums  . Methotrexate Derivatives Other (See Comments)    Abnormal liver function  . Amoxicillin Rash  . Arava [Leflunomide] Rash  .  Clindamycin/Lincomycin Rash  . Doxycycline Rash  . Lodine [Etodolac] Rash  . Percocet [Oxycodone-Acetaminophen] Rash  . Sulfa Antibiotics Rash    CODE STATUS:     Code Status Orders        Start  Ordered   08/03/15 0950  Full code   Continuous     08/03/15 0949    Code Status History    Date Active Date Inactive Code Status Order ID Comments User Context   This patient has a current code status but no historical code status.      TOTAL TIME TAKING CARE OF THIS PATIENT: 45 minutes.  Discussed with patient's family, care management extensively about oxygen therapy, pre-qualification for home, time spent approximately 15 minutes. On discussions  Mykell Genao M.D on 08/06/2015 at 5:49 PM  Between 7am to 6pm - Pager - [phone]  After 6pm go to [email or web address] - password [email or web address] [facility]  Office  [phone]  CC: Primary care physician; [name], MD

## 2015-08-06 NOTE — Progress Notes (Signed)
Patient requesting PRN xanax early, reports anxiety regarding not being discharged today. Dr. Ether Griffins paged and said xanax can be given early.

## 2015-08-06 NOTE — Progress Notes (Signed)
Physical Therapy Treatment Patient Details Name: Kaitlyn Huff MRN: [id number] DOB: 04/28/1941 Today's Date: 08/06/2015    History of Present Illness Eleesa Vanaken is a 75 y.o. female with a known history of hyperlipidemia, osteoarthritis, GERD, hypertension, osteoporosis, rheumatoid arthritis presented to the emergency room with difficulty breathing and nausea and vomiting. Currently on oral Levaquin antibiotic which was started 4 days ago by primary care physician for pneumonia. Patient says she felt short of breath today and came to the emergency room. No history of any fever or chills.Has nausea and dry heaves but no evidence of vomiting. No complaints of any chest pain. No orthopnea or paroxysmal dyspnea. Patient was evaluated in the emergency room her first set of troponin was elevated but EKG was normal sinus rhythm with no ST segment changes.No history of any sick contacts at home or any recent travel. Pt was admitted for COPD exaccerbation as well as acute bronchitis. It was determined that elevated troponin was due to demand ischemia and pt underwent stress test earlier in the day. Family reports 1 fall in the last 12 months however pt denies    PT Comments    Pt is making good progress towards goals with good endurance during ambulation. All mobility performed while on 2L of O2 with sats at 93% post ambulation. Prior to mobility, sats at 86% while on room air, RN informed and advised to place on O2. Pt with improved endurance with O2 including increased gait speed. Pt very motivated to return home.  Follow Up Recommendations  Home health PT;Supervision - Intermittent     Equipment Recommendations  Rolling walker with 5" wheels    Recommendations for Other Services       Precautions / Restrictions Precautions Precautions: Fall Restrictions Weight Bearing Restrictions: No    Mobility  Bed Mobility               General bed mobility comments: not performed as pt  received up in chair.  Transfers Overall transfer level: Needs assistance Equipment used: Rolling walker (2 wheeled) Transfers: Sit to/from Stand Sit to Stand: Modified independent (Device/Increase time)         General transfer comment: safe technique performed with pushing from seated surface. Once standing, pt without LOB while holding rw.  Ambulation/Gait Ambulation/Gait assistance: Supervision Ambulation Distance (Feet): 200 Feet Assistive device: Rolling walker (2 wheeled) Gait Pattern/deviations: Step-through pattern     General Gait Details: ambulates with good speed and safe technique. Occasionally, small LOB noted to R side during turns. Pt able to carry conversation during ambulation with reciprocal gait pattern performed.   Stairs            Wheelchair Mobility    Modified Rankin (Stroke Patients Only)       Balance                                    Cognition Arousal/Alertness: Awake/alert Behavior During Therapy: WFL for tasks assessed/performed Overall Cognitive Status: Within Functional Limits for tasks assessed                      Exercises Other Exercises Other Exercises: Standing dynamic balance ther-ex performed including heel raises, toe raises, mini squats, and B LE hip extension. All balance training performed x 10 reps with fingertips on window sill and cga for assistance. Pt also performed B UE 10" forward reach with supervision. Safe  technique performed and cues given for correct technique.    General Comments        Pertinent Vitals/Pain Pain Assessment: No/denies pain    Home Living                      Prior Function            PT Goals (current goals can now be found in the care plan section) Acute Rehab PT Goals Patient Stated Goal: "I want to get back home, do you think they will let me leave today?" PT Goal Formulation: With patient/family Time For Goal Achievement: 08/18/15 Potential  to Achieve Goals: Good Progress towards PT goals: Progressing toward goals    Frequency  Min 2X/week    PT Plan Current plan remains appropriate    Co-evaluation             End of Session Equipment Utilized During Treatment: Gait belt;Oxygen Activity Tolerance: Patient limited by fatigue Patient left: in chair;with chair alarm set     Time: 0943-1006 PT Time Calculation (min) (ACUTE ONLY): 23 min  Charges:  $Gait Training: 8-22 mins $Neuromuscular Re-education: 8-22 mins                    G Codes:      Sheral Pfahler 08-10-2015, 11:30 AM  Greggory Stallion, PT, DPT [phone]

## 2015-08-07 ENCOUNTER — Emergency Department: Payer: Medicare Other

## 2015-08-07 ENCOUNTER — Encounter: Payer: Self-pay | Admitting: *Deleted

## 2015-08-07 ENCOUNTER — Telehealth: Payer: Self-pay | Admitting: *Deleted

## 2015-08-07 ENCOUNTER — Emergency Department
Admission: EM | Admit: 2015-08-07 | Discharge: 2015-08-07 | Disposition: A | Discharge status: Home / Self Care | Payer: Medicare Other | Attending: Emergency Medicine | Admitting: Emergency Medicine

## 2015-08-07 ENCOUNTER — Telehealth: Payer: Self-pay

## 2015-08-07 DIAGNOSIS — Z7952 Long term (current) use of systemic steroids: Secondary | ICD-10-CM | POA: Diagnosis not present

## 2015-08-07 DIAGNOSIS — M069 Rheumatoid arthritis, unspecified: Secondary | ICD-10-CM | POA: Insufficient documentation

## 2015-08-07 DIAGNOSIS — M25461 Effusion, right knee: Secondary | ICD-10-CM | POA: Insufficient documentation

## 2015-08-07 DIAGNOSIS — Z7982 Long term (current) use of aspirin: Secondary | ICD-10-CM | POA: Insufficient documentation

## 2015-08-07 DIAGNOSIS — Z79899 Other long term (current) drug therapy: Secondary | ICD-10-CM | POA: Diagnosis not present

## 2015-08-07 DIAGNOSIS — I10 Essential (primary) hypertension: Secondary | ICD-10-CM | POA: Diagnosis not present

## 2015-08-07 DIAGNOSIS — G2581 Restless legs syndrome: Secondary | ICD-10-CM

## 2015-08-07 DIAGNOSIS — Z880 Allergy status to penicillin: Secondary | ICD-10-CM | POA: Insufficient documentation

## 2015-08-07 MED ORDER — GUAIFENESIN-DM 100-10 MG/5ML PO SYRP: 5.0000 mL | ORAL_SOLUTION | ORAL | Status: DC | PRN

## 2015-08-07 MED ORDER — ALBUTEROL SULFATE (2.5 MG/3ML) 0.083% IN NEBU: 2.5000 mg | INHALATION_SOLUTION | RESPIRATORY_TRACT | Status: DC

## 2015-08-07 MED ORDER — ALPRAZOLAM 0.25 MG PO TABS: ORAL_TABLET | ORAL | Status: DC

## 2015-08-07 MED ORDER — PRAMIPEXOLE DIHYDROCHLORIDE 0.5 MG PO TABS: 0.5000 mg | ORAL_TABLET | Freq: Two times a day (BID) | ORAL | Status: DC | PRN

## 2015-08-07 MED ORDER — TIOTROPIUM BROMIDE MONOHYDRATE 18 MCG IN CAPS: 18.0000 ug | ORAL_CAPSULE | Freq: Every day | RESPIRATORY_TRACT | Status: DC

## 2015-08-07 MED ORDER — PRAMIPEXOLE DIHYDROCHLORIDE 1 MG PO TABS: 1.0000 mg | ORAL_TABLET | Freq: Every day | ORAL | Status: DC

## 2015-08-07 MED ORDER — PREDNISONE 10 MG (21) PO TBPK: 10.0000 mg | ORAL_TABLET | Freq: Every day | ORAL | Status: DC

## 2015-08-07 MED ORDER — ALBUTEROL SULFATE (2.5 MG/3ML) 0.083% IN NEBU: 2.5000 mg | INHALATION_SOLUTION | Freq: Four times a day (QID) | RESPIRATORY_TRACT | Status: DC | PRN

## 2015-08-07 MED ORDER — GUAIFENESIN ER 600 MG PO TB12: 600.0000 mg | ORAL_TABLET | Freq: Two times a day (BID) | ORAL | Status: DC

## 2015-08-07 MED ORDER — HYDROCOD POLST-CPM POLST ER 10-8 MG/5ML PO SUER: 5.0000 mL | Freq: Two times a day (BID) | ORAL | Status: DC | PRN

## 2015-08-07 NOTE — Discharge Instructions (Signed)
Knee Effusion Knee effusion means that you have extra fluid in your knee. This can cause pain. Your knee may be more difficult to bend and move. HOME CARE  Use crutches as told by your doctor.  Wear a knee brace as told by your doctor.  Apply ice to the swollen area:  Put ice in a plastic bag.  Place a towel between your skin and the bag.  Leave the ice on for 20 minutes, 2-3 times per day.  Keep your knee raised (elevated) when you are sitting or lying down.  Take medicines only as told by your doctor.  Do any rehabilitation or strengthening exercises as told by your doctor.  Rest your knee as told by your doctor. You may start doing your normal activities again when your doctor says it is okay.  Keep all follow-up visits as told by your doctor. This is important. GET HELP IF:   You continue to have pain in your knee. GET HELP RIGHT AWAY IF:  You have increased swelling or redness of your knee.  You have severe pain in your knee.  You have a fever.   This information is not intended to replace advice given to you by your health care provider. Make sure you discuss any questions you have with your health care provider.   Document Released: 07/30/2010 Document Revised: 07/18/2014 Document Reviewed: 02/10/2014 Elsevier Interactive Patient Education 2016 [place].   Please continue prednisone and Tramadol as previously prescribed.

## 2015-08-07 NOTE — Progress Notes (Signed)
Pt being discharged home at this time, discharge and prescriptions reviewed with pt and daughter, states understanding, pt with no noted complaints at discharge, no distress or discomfort noted

## 2015-08-07 NOTE — Telephone Encounter (Signed)
Patient will discharge from [facility] 08/07/15. She was admitted for acute raspatory failure.   Please advise a place on Dr. Bary Leriche schedule to place patient.

## 2015-08-07 NOTE — Progress Notes (Signed)
Pt transferred to room 105. VS WNL, Pt stable and A&O. Patient will notified family members at a later time today.

## 2015-08-07 NOTE — Care Management (Signed)
Discharge to home today per Dr. Ether Griffins. [facility] for [place] services and home nebulizer. Daughter, Horris Latino, will transport. Shelbie Ammons RN MSN CCM Care Management [phone]

## 2015-08-07 NOTE — ED Provider Notes (Signed)
[facility] Emergency Department Provider Note  ____________________________________________  Time seen: Approximately 10:48 PM  I have reviewed the triage vital signs and the nursing notes.   HISTORY  Chief Complaint Knee Pain    HPI Kaitlyn Huff is a 75 y.o. female, NAD, presents to the emergency department with 4 hours right knee pain and swelling. She cannot bear weight on the right leg due to knee pain. She is accompanied by 2 family members who assist with history. Has a history of rheumatoid arthritis and does believe she is having a flare as has had similar episodes in the past.  Denies any headaches, numbness, tingling, weakness, chest pain nor shortness of breath. No fevers or chills. No falls, injuries or traumas.    Past Medical History  Diagnosis Date  . Rheumatoid arthritis(714.0)     MTX transaminitis, Leflunomide (rash), enbrel, plaquinil, prednisone, remicade, Imuran (transaminitis)  . Hyperlipidemia   . Diverticulitis   . Osteoarthritis   . GERD (gastroesophageal reflux disease)   . Hypertension   . Anemia   . Positive PPD     s/p INH (2006)  . Osteoporosis     actonel  . 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 Restless leg syndrome 08/07/2015  . Acute respiratory failure with hypoxia ([place]) 08/06/2015  . COPD exacerbation ([place]) 08/06/2015  . Bronchitis, chronic obstructive, with exacerbation ([place]) 08/06/2015  . Hyponatremia 08/06/2015  . Pancytopenia ([place]) 08/06/2015  . Ileus ([place]) 08/06/2015  . Elevated troponin 08/03/2015  . Nausea & vomiting 08/03/2015  . Cough 07/30/2015  . History of colonic polyps 07/10/2015  . Swelling, cheek 01/11/2015  . Health care maintenance 01/11/2015  . Pneumonia 11/09/2014  . Right hip pain 09/02/2014  . Diarrhea 09/02/2014  . Hospital discharge follow-up 09/02/2014  . Itching 08/04/2013  . Stress 08/04/2013  . Weight loss  08/04/2013  . Abnormal liver function test 03/26/2013  . Obstructive sleep apnea 12/23/2012  . Dilated bile duct 12/23/2012  . Hypercholesterolemia 08/12/2012  . Rheumatoid arthritis ([place]) 08/12/2012  . GERD (gastroesophageal reflux disease) 08/12/2012  . Hypertension 08/12/2012  . Anemia 08/12/2012    Past Surgical History  Procedure Laterality Date  . Abdominal hysterectomy    . Tracheostomy  1959  . Tubal ligation    . Cervical cone biopsy    . [place] surgery    . Cholecystectomy  06/22/14  . Colonoscopy with propofol N/A 07/09/2015    Procedure: COLONOSCOPY WITH PROPOFOL;  Surgeon: Manya Silvas, MD;  Location: [facility] ENDOSCOPY;  Service: Endoscopy;  Laterality: N/A;  . Esophagogastroduodenoscopy (egd) with propofol N/A 07/09/2015    Procedure: ESOPHAGOGASTRODUODENOSCOPY (EGD) WITH PROPOFOL;  Surgeon: Manya Silvas, MD;  Location: [facility] ENDOSCOPY;  Service: Endoscopy;  Laterality: N/A;    Current Outpatient Rx  Name  Route  Sig  Dispense  Refill  . ADVAIR DISKUS 250-50 MCG/DOSE AEPB      USE 1 INHALATION INTO THE LUNGS EVERY 12 HOURS   180 each   0   . albuterol (PROVENTIL HFA;VENTOLIN HFA) 108 (90 Base) MCG/ACT inhaler   Inhalation   Inhale 2 puffs into the lungs every 4 (four) hours as needed for wheezing or shortness of breath.   1 Inhaler   2   . albuterol (PROVENTIL) (2.5 MG/3ML) 0.083% nebulizer solution   Nebulization   Take 3 mLs (2.5 mg total) by nebulization every 4 (  four) hours.   75 mL   12   . ALPRAZolam (XANAX) 0.25 MG tablet      1 tablet q day prn   30 tablet   0   . aspirin 81 MG tablet   Oral   Take 81 mg by mouth daily.         . calcium-vitamin D (OSCAL 500/200 D-3) 500-200 MG-UNIT per tablet   Oral   Take 1 tablet by mouth 2 (two) times daily.         . cetirizine (ZYRTEC) 10 MG tablet      TAKE 1 TABLET DAILY   90 tablet   3   . chlorpheniramine-HYDROcodone (TUSSIONEX) 10-8 MG/5ML SUER   Oral   Take 5 mLs by mouth  every 12 (twelve) hours as needed for cough.   140 mL   0   . FeFum-FePo-FA-B Cmp-C-Zn-Mn-Cu (TANDEM PLUS) 162-115.2-1 MG CAPS      TAKE 1 CAPSULE DAILY   90 each   2   . furosemide (LASIX) 20 MG tablet      TAKE 1 TABLET DAILY   90 tablet   1   . gabapentin (NEURONTIN) 300 MG capsule   Oral   Take 300 mg by mouth 4 (four) times daily.         Marland Kitchen guaiFENesin (MUCINEX) 600 MG 12 hr tablet   Oral   Take 1 tablet (600 mg total) by mouth 2 (two) times daily.   30 tablet   3   . guaiFENesin-dextromethorphan (ROBITUSSIN DM) 100-10 MG/5ML syrup   Oral   Take 5 mLs by mouth every 4 (four) hours as needed for cough.   118 mL   0   . hydroxychloroquine (PLAQUENIL) 200 MG tablet   Oral   Take 1 tablet (200 mg total) by mouth 2 (two) times daily.   180 tablet   1   . InFLIXimab (REMICADE IV)      Inject every 6 weeks         . metoprolol succinate (TOPROL-XL) 50 MG 24 hr tablet      TAKE 1 TABLET (50 MG TOTAL) DAILY. TAKE WITH OR IMMEDIATELY FOLLOWING A MEAL   90 tablet   2   . Multiple Vitamin (MULTIVITAMIN) capsule   Oral   Take 1 capsule by mouth daily.         Marland Kitchen NEXIUM 40 MG capsule      TAKE 1 CAPSULE TWICE A DAY BEFORE A MEAL   180 capsule   2   . potassium chloride (MICRO-K) 10 MEQ CR capsule      TAKE 2 CAPSULES TWICE A DAY   360 capsule   3   . pramipexole (MIRAPEX) 0.5 MG tablet   Oral   Take 1 tablet (0.5 mg total) by mouth 2 (two) times daily as needed (restless legs).   60 tablet   6   . pramipexole (MIRAPEX) 1 MG tablet   Oral   Take 1 tablet (1 mg total) by mouth at bedtime.   30 tablet   6   . predniSONE (DELTASONE) 5 MG tablet   Oral   Take 5 mg by mouth 2 (two) times daily with a meal.         . predniSONE (STERAPRED UNI-PAK 21 TAB) 10 MG (21) TBPK tablet   Oral   Take 1 tablet (10 mg total) by mouth daily. Please take 6 pills in the morning on the first day then  taper by one pill every 2 days until finished. Thank you    42 tablet   0   . simvastatin (ZOCOR) 20 MG tablet      TAKE 1 TABLET DAILY   90 tablet   0   . sodium chloride (OCEAN) 0.65 % nasal spray   Nasal   Place 1 spray into the nose as needed for congestion.         Marland Kitchen tiotropium (SPIRIVA) 18 MCG inhalation capsule   Inhalation   Place 1 capsule (18 mcg total) into inhaler and inhale daily.   30 capsule   12   . traMADol (ULTRAM) 50 MG tablet   Oral   Take 1 tablet (50 mg total) by mouth at bedtime as needed. Patient taking differently: Take 50 mg by mouth 2 (two) times daily.    30 tablet   0     Allergies Astelin; Ciprofloxacin; Codeine; Dilaudid; Flexeril; Imuran; Keflex; Lisinopril; Lyrica; Methotrexate derivatives; Amoxicillin; Arava; Clindamycin/lincomycin; Doxycycline; Lodine; Percocet; and Sulfa antibiotics  Family History  Problem Relation Age of Onset  . Heart disease Father     MI  . Heart disease Mother   . Valvular heart disease Mother   . Colon cancer Neg Hx   . Breast cancer Sister 50    Social History Social History  Substance Use Topics  . Smoking status: Never Smoker   . Smokeless tobacco: Never Used  . Alcohol Use: No     Review of Systems  Constitutional: No fever/chills. No fatigue.  Cardiovascular: No chest pain. Respiratory: No shortness of breath.  Musculoskeletal: Right knee pain, swelling. Negative for back pain.  Skin: Negative for rash, wounds, lesions.  Neurological: Negative for headaches, focal weakness or numbness. 10-point ROS otherwise negative.  ____________________________________________   PHYSICAL EXAM:  VITAL SIGNS: ED Triage Vitals  Enc Vitals Group     BP 08/07/15 2211 126/88 mmHg     Pulse Rate 08/07/15 2211 125     Resp 08/07/15 2211 18     Temp 08/07/15 2211 97.8 F (36.6 C)     Temp Source 08/07/15 2211 Oral     SpO2 08/07/15 2211 96 %     Weight 08/07/15 2211 169 lb (76.658 kg)     Height 08/07/15 2211 5\' 4"  (1.626 m)     Head Cir --      Peak Flow  --      Pain Score --      Pain Loc --      Pain Edu? --      Excl. in [place]? --     Constitutional: Alert and oriented. Well appearing and in no acute distress. Eyes: Conjunctivae are normal.  Head: Atraumatic. Cardiovascular: Good peripheral circulation. Respiratory: Normal respiratory effort without tachypnea or retractions.  Musculoskeletal: Tenderness about the right knee. Swelling superior and lateral to the right knee. Decreased flexion, normal extension. Effusion of right knee is appreciated with positive bulge sign.  Neurologic:  Normal speech and language. No gross focal neurologic deficits are appreciated.  Skin:  Skin is warm, dry and intact. No rash noted. Psychiatric: Mood and affect are normal. Speech and behavior are normal. Patient exhibits appropriate insight and judgement.   ____________________________________________   LABS  None  ____________________________________________  EKG  None ____________________________________________  RADIOLOGY I personally viewed and evaluated these images (plain radiographs) as part of my medical decision making, as well as reviewing the written report by the radiologist.  Dg Knee Complete 4 Views  Right  08/07/2015  CLINICAL DATA:  Knee pain and unable to bear weight.  No injury. EXAM: RIGHT KNEE - COMPLETE 4+ VIEW COMPARISON:  None. FINDINGS: No evidence of fracture. No subluxation or dislocation. Hypertrophic spurring is seen in the medial and patellofemoral compartments. Joint effusion is evident in the suprapatellar bursa. IMPRESSION: Degenerative changes with joint effusion. Electronically Signed   By: Misty Stanley M.D.   On: 08/07/2015 23:09    ____________________________________________    PROCEDURES  Procedure(s) performed: None    Medications - No data to display   ____________________________________________   INITIAL IMPRESSION / ASSESSMENT AND PLAN / ED COURSE  Pertinent labs & imaging results that  were available during my care of the patient were reviewed by me and considered in my medical decision making (see chart for details).  Discussed option to drain effusion from right knee. Risks and benefits discussed in regards to patients recent admission and use of anti-coagulants. Patient consulted with her family members present and wishes to forgo draining of the effusion today and continue with conservative measures.   Patient's diagnosis is consistent with right knee effusion. Patient currently on prednisone dose pack and started such today at 60mg .  Will continue that medication as previously prescribed. Wear ACE wrap to keep compression on the right knee. Follow up with Orthopedics on Monday for re-evaluation. Patient is given ED precautions to return to the ED for any worsening or new symptoms.    ___________________________________________  FINAL CLINICAL IMPRESSION(S) / ED DIAGNOSES  Final diagnoses:  Effusion of right knee      NEW MEDICATIONS STARTED DURING THIS VISIT:  New Prescriptions   No medications on file         Braxton Feathers, PA-C 08/07/15 [place], PA-C 08/07/15 [place], MD 08/07/15 [phone]

## 2015-08-07 NOTE — Telephone Encounter (Signed)
Transition Care Management Follow-up Telephone Call   Date discharged? 08/07/15   How have you been since you were released from the hospital? So far I am doing fine.  I am using the home oxygen @2L  at night and inhalers and nebulizer during the day. No SOB.  No pain.   Do you understand why you were in the hospital? Yes, pneumonia and trouble breathing.   Do you understand the discharge instructions? Yes, moving slowly between activities and using the inhaler, nebulizer and oxygen as prescribed.   Where were you discharged to? Home   Items Reviewed:  Medications reviewed: Yes, taking all new and scheduled medications without issues.  Allergies reviewed: Yes, no changes.  Dietary changes reviewed: Yes, no changes.  Referrals reviewed: Yes, appointment scheduled with [facility] and PCP.   Functional Questionnaire:   Activities of Daily Living (ADLs):   She states they are independent in the following: Independent in all ADLs. States they require assistance with the following: No assistance required at this time.  Daughter available to assist if needed.   Any transportation issues/concerns?: No.   Any patient concerns? None at this time.   Confirmed importance and date/time of follow-up visits scheduled Yes, appointment made 08/10/15 at 4:00.  Provider Appointment booked with Dr. Nicki Reaper (PCP).  Confirmed with patient if condition begins to worsen call PCP or go to the ER.  Patient was given the office number and encouraged to call back with question or concerns.  : Yes, patient verbalized understanding.

## 2015-08-07 NOTE — Telephone Encounter (Signed)
Availability (remove on hold) OK for 08/10/15 3:00 or 4:00

## 2015-08-07 NOTE — ED Notes (Signed)
Reviewed d/c instructions, follow-up care, use of ice/elevation with pt. Pt verbalized understanding.

## 2015-08-07 NOTE — Care Management Important Message (Signed)
Important Message  Patient Details  Name: Kaitlyn Huff MRN: [id number] Date of Birth: Mar 29, 1941   Medicare Important Message Given:  Yes    [name] [name] 08/07/2015, 9:49 AM

## 2015-08-07 NOTE — ED Notes (Signed)
Pt to triage via wheelchair.  Pt has right knee pain.  No known injury .  Pt has RA and thinks she is having a flare.

## 2015-08-08 LAB — CULTURE, BLOOD (ROUTINE X 2)
CULTURE: NO GROWTH
CULTURE: NO GROWTH

## 2015-08-09 DIAGNOSIS — I5031 Acute diastolic (congestive) heart failure: Secondary | ICD-10-CM | POA: Diagnosis not present

## 2015-08-09 DIAGNOSIS — M810 Age-related osteoporosis without current pathological fracture: Secondary | ICD-10-CM | POA: Diagnosis not present

## 2015-08-09 DIAGNOSIS — I248 Other forms of acute ischemic heart disease: Secondary | ICD-10-CM | POA: Diagnosis not present

## 2015-08-09 DIAGNOSIS — K219 Gastro-esophageal reflux disease without esophagitis: Secondary | ICD-10-CM | POA: Diagnosis not present

## 2015-08-09 DIAGNOSIS — I361 Nonrheumatic tricuspid (valve) insufficiency: Secondary | ICD-10-CM | POA: Diagnosis not present

## 2015-08-09 DIAGNOSIS — E785 Hyperlipidemia, unspecified: Secondary | ICD-10-CM | POA: Diagnosis not present

## 2015-08-09 DIAGNOSIS — M069 Rheumatoid arthritis, unspecified: Secondary | ICD-10-CM | POA: Diagnosis not present

## 2015-08-09 DIAGNOSIS — Z9981 Dependence on supplemental oxygen: Secondary | ICD-10-CM | POA: Diagnosis not present

## 2015-08-09 DIAGNOSIS — I110 Hypertensive heart disease with heart failure: Secondary | ICD-10-CM | POA: Diagnosis not present

## 2015-08-09 DIAGNOSIS — Z7982 Long term (current) use of aspirin: Secondary | ICD-10-CM | POA: Diagnosis not present

## 2015-08-09 DIAGNOSIS — J471 Bronchiectasis with (acute) exacerbation: Secondary | ICD-10-CM | POA: Diagnosis not present

## 2015-08-09 DIAGNOSIS — I340 Nonrheumatic mitral (valve) insufficiency: Secondary | ICD-10-CM | POA: Diagnosis not present

## 2015-08-09 NOTE — Discharge Summary (Signed)
[facility] [place] NAME: Kaitlyn Huff    MR#:  [id number]  DATE OF BIRTH:  10-07-1940  DATE OF ADMISSION:  08/03/2015 ADMITTING PHYSICIAN: Theodoro Grist, MD  DATE OF DISCHARGE: 08/07/2015 10:20 AM  PRIMARY CARE PHYSICIAN: [name], MD     ADMISSION DIAGNOSIS:  NSTEMI (non-ST elevated myocardial infarction) ([place]) [I21.4]  DISCHARGE DIAGNOSIS:  Principal Problem:   Acute respiratory failure with hypoxia (HCC) Active Problems:   COPD exacerbation (HCC)   Elevated troponin   Nausea & vomiting   Bronchitis, chronic obstructive, with exacerbation (HCC)   Hyponatremia   Pancytopenia (HCC)   Ileus (HCC)   Restless leg syndrome   SECONDARY DIAGNOSIS:   Past Medical History  Diagnosis Date  . Rheumatoid arthritis(714.0)     MTX transaminitis, Leflunomide (rash), enbrel, plaquinil, prednisone, remicade, Imuran (transaminitis)  . Hyperlipidemia   . Diverticulitis   . Osteoarthritis   . GERD (gastroesophageal reflux disease)   . Hypertension   . Anemia   . Positive PPD     s/p INH (2006)  . Osteoporosis     actonel  . Valvular heart disease     moderate MR and TR    .pro HOSPITAL COURSE:  The patient is 75 year old female with past medical history significant for history of rheumatoid arthritis, hyperlipidemia, osteoarthritis, hypertension, valvular heart disease who presents to the hospital with complaints of nausea, vomiting and shortness of breath. Patient was initiated on levofloxacin 4 days ago prior by primary care physician for pneumonia, however, did not improve and presented to the hospital with shortness of breath. She had no chest pains. EKG was unremarkable. Patient's troponin was found to be elevated at 0.15 on the first set, but  declined afterwards. Patient was seen by cardiologist DR. Nehemiah Massed, Echo and stress test were performed,  echo was normal, stress test revealed a normal myocardial perfusion  without evidence of myocardial ischemia . Chest x-ray showed peribronchial thickening, mild vascular congestion. Patient was initiated on antibiotics intravenously and admitted to the hospital for further evaluation and treatment. Patient denied smoking history, but admited of living with person who smoked for 40 years. Blood cultures were negative 2, sputum cultures not obtained. Patient was treated with steroids, inhalers,  but still remained hypoxic on room air with oxygen saturations in 80s. CT angiogram was performed, and showed no emphysema, but chronic bronchitis with bronchiectasis, likely COPD with chronic bronchitis, qualifying her for oxygen therapy at home. Incentive spirometry was initiated. While in the hospital patient complained of intermittent nausea and vomiting, xray showed ileus. Gastroenterology was consulted and Colestid was discontinued.  With conservative therapy nausea and vomiting resolved  .  Discussion by problem: 1. Dyspnea, multifactorial likely due to acute diastolic congestive heart failure exacerbation, also COPD/chronic bronchitis exacerbation, patient was given  Lasix as needed, incentive spirometry was initiated . Patient was advised to continue nebulizing , oxygen therapy as needed, Echocardiogram was performed, was found to be normal. 2. COPD/chronic bronchitis with bronchiectasis exacerbation, taper steroids, continue inhalation therapy with Advair , albuterol and tiotropium, improved clinically. Pre-qualified for O2 at home.  3. Chronic bronchitis exacerbation, sputum cultures not reported, continue patient on Humibid, levofloxacin, continue Robitussin and Tussionex to help with dry cough, improved 4. Elevated troponin, likely demand ischemia, normal echocardiogram , appreciate cardiology input , stress test revealed a normal myocardial perfusion without evidence of myocardial ischemia, follow up with cardiology as outpatient.  5. Hyponatremia, resolved 6. Renal  insufficiency,  resolved initially with IV fluid administration, unremarkable urinalysis, follow as outpatient.  7. Anemia. Hemoccult ordered,, not received, , continue aspirin, follow Hgb closely as outpatient 8. Thrombocytopenia, worse with heparin infusion, discontinued heparin, and improved platelet count     DISCHARGE CONDITIONS:   Stable  CONSULTS OBTAINED:  Treatment Team:  Corey Skains, MD  DRUG ALLERGIES:   Allergies  Allergen Reactions  . Astelin [Azelastine Hcl]   . Ciprofloxacin Other (See Comments)    GI upset  . Codeine Other (See Comments)    Altered mental status  . Dilaudid [Hydromorphone]   . Flexeril [Cyclobenzaprine]   . Imuran [Azathioprine] Other (See Comments)    Abnormal liver function  . Keflex [Cephalexin] Other (See Comments)    GI upset  . Lisinopril Itching  . Lyrica [Pregabalin] Other (See Comments)    Sore gums  . Methotrexate Derivatives Other (See Comments)    Abnormal liver function  . Amoxicillin Rash  . Arava [Leflunomide] Rash  . Clindamycin/Lincomycin Rash  . Doxycycline Rash  . Lodine [Etodolac] Rash  . Percocet [Oxycodone-Acetaminophen] Rash  . Sulfa Antibiotics Rash    DISCHARGE MEDICATIONS:   Discharge Medication List as of 08/07/2015 10:00 AM    START taking these medications   Details  albuterol (PROVENTIL) (2.5 MG/3ML) 0.083% nebulizer solution Take 3 mLs (2.5 mg total) by nebulization every 4 (four) hours., Starting 08/07/2015, Until Discontinued, Normal    chlorpheniramine-HYDROcodone (TUSSIONEX) 10-8 MG/5ML SUER Take 5 mLs by mouth every 12 (twelve) hours as needed for cough., Starting 08/07/2015, Until Discontinued, Normal    guaiFENesin (MUCINEX) 600 MG 12 hr tablet Take 1 tablet (600 mg total) by mouth 2 (two) times daily., Starting 08/07/2015, Until Discontinued, Normal    guaiFENesin-dextromethorphan (ROBITUSSIN DM) 100-10 MG/5ML syrup Take 5 mLs by mouth every 4 (four) hours as needed for cough., Starting  08/07/2015, Until Discontinued, Normal    !! pramipexole (MIRAPEX) 0.5 MG tablet Take 1 tablet (0.5 mg total) by mouth 2 (two) times daily as needed (restless legs)., Starting 08/07/2015, Until Discontinued, Normal    !! pramipexole (MIRAPEX) 1 MG tablet Take 1 tablet (1 mg total) by mouth at bedtime., Starting 08/07/2015, Until Discontinued, Normal    predniSONE (STERAPRED UNI-PAK 21 TAB) 10 MG (21) TBPK tablet Take 1 tablet (10 mg total) by mouth daily. Please take 6 pills in the morning on the first day then taper by one pill every 2 days until finished. Thank you, Starting 08/07/2015, Until Discontinued, Normal    tiotropium (SPIRIVA) 18 MCG inhalation capsule Place 1 capsule (18 mcg total) into inhaler and inhale daily., Starting 08/07/2015, Until Discontinued, Normal     !! - Potential duplicate medications found. Please discuss with provider.    CONTINUE these medications which have CHANGED   Details  ALPRAZolam (XANAX) 0.25 MG tablet 1 tablet q day prn, Print      CONTINUE these medications which have NOT CHANGED   Details  ADVAIR DISKUS 250-50 MCG/DOSE AEPB USE 1 INHALATION INTO THE LUNGS EVERY 12 HOURS, Normal    albuterol (PROVENTIL HFA;VENTOLIN HFA) 108 (90 Base) MCG/ACT inhaler Inhale 2 puffs into the lungs every 4 (four) hours as needed for wheezing or shortness of breath., Starting 07/30/2015, Until Discontinued, Normal    aspirin 81 MG tablet Take 81 mg by mouth daily., Until Discontinued, Historical Med    calcium-vitamin D (OSCAL 500/200 D-3) 500-200 MG-UNIT per tablet Take 1 tablet by mouth 2 (two) times daily., Until Discontinued, Historical Med  cetirizine (ZYRTEC) 10 MG tablet TAKE 1 TABLET DAILY, Normal    FeFum-FePo-FA-B Cmp-C-Zn-Mn-Cu (TANDEM PLUS) 162-115.2-1 MG CAPS TAKE 1 CAPSULE DAILY, Normal    furosemide (LASIX) 20 MG tablet TAKE 1 TABLET DAILY, Normal    gabapentin (NEURONTIN) 300 MG capsule Take 300 mg by mouth 4 (four) times daily., Until Discontinued,  Historical Med    hydroxychloroquine (PLAQUENIL) 200 MG tablet Take 1 tablet (200 mg total) by mouth 2 (two) times daily., Starting 05/13/2013, Until Discontinued, Normal    InFLIXimab (REMICADE IV) Inject every 6 weeks, Until Discontinued, Historical Med    metoprolol succinate (TOPROL-XL) 50 MG 24 hr tablet TAKE 1 TABLET (50 MG TOTAL) DAILY. TAKE WITH OR IMMEDIATELY FOLLOWING A MEAL, Normal    Multiple Vitamin (MULTIVITAMIN) capsule Take 1 capsule by mouth daily., Until Discontinued, Historical Med    NEXIUM 40 MG capsule TAKE 1 CAPSULE TWICE A DAY BEFORE A MEAL, Normal    potassium chloride (MICRO-K) 10 MEQ CR capsule TAKE 2 CAPSULES TWICE A DAY, Normal    simvastatin (ZOCOR) 20 MG tablet TAKE 1 TABLET DAILY, Normal    sodium chloride (OCEAN) 0.65 % nasal spray Place 1 spray into the nose as needed for congestion., Until Discontinued, Historical Med    traMADol (ULTRAM) 50 MG tablet Take 1 tablet (50 mg total) by mouth at bedtime as needed., Starting 09/01/2014, Until Discontinued, Print    predniSONE (DELTASONE) 5 MG tablet Take 5 mg by mouth 2 (two) times daily with a meal., Until Discontinued, Historical Med      STOP taking these medications     colestipol (COLESTID) 1 G tablet      levofloxacin (LEVAQUIN) 750 MG tablet      HYDROcodone-acetaminophen (NORCO/VICODIN) 5-325 MG tablet          DISCHARGE INSTRUCTIONS:    Follow up with PCP, cardiology, gastroenterology  If you experience worsening of your admission symptoms, develop shortness of breath, life threatening emergency, suicidal or homicidal thoughts you must seek medical attention immediately by calling 911 or calling your MD immediately  if symptoms less severe.  You Must read complete instructions/literature along with all the possible adverse reactions/side effects for all the Medicines you take and that have been prescribed to you. Take any new Medicines after you have completely understood and accept all  the possible adverse reactions/side effects.   Please note  You were cared for by a hospitalist during your hospital stay. If you have any questions about your discharge medications or the care you received while you were in the hospital after you are discharged, you can call the unit and asked to speak with the hospitalist on call if the hospitalist that took care of you is not available. Once you are discharged, your primary care physician will handle any further medical issues. Please note that NO REFILLS for any discharge medications will be authorized once you are discharged, as it is imperative that you return to your primary care physician (or establish a relationship with a primary care physician if you do not have one) for your aftercare needs so that they can reassess your need for medications and monitor your lab values.    Today   CHIEF COMPLAINT:   Chief Complaint  Patient presents with  . Chills  . Shortness of Breath  . Pneumonia    HISTORY OF PRESENT ILLNESS:  Kaitlyn Huff  is a 75 y.o. female with a known history of rheumatoid arthritis, hyperlipidemia, osteoarthritis, hypertension, valvular heart disease  who presents to the hospital with complaints of nausea, vomiting and shortness of breath. Patient was initiated on levofloxacin 4 days ago prior by primary care physician for pneumonia, however, did not improve and presented to the hospital with shortness of breath. She had no chest pains. EKG was unremarkable. Patient's troponin was found to be elevated at 0.15 on the first set, but  declined afterwards. Patient was seen by cardiologist DR. Nehemiah Massed, Echo and stress test were performed,  echo was normal, stress test revealed a normal myocardial perfusion without evidence of myocardial ischemia . Chest x-ray showed peribronchial thickening, mild vascular congestion. Patient was initiated on antibiotics intravenously and admitted to the hospital for further evaluation and  treatment. Patient denied smoking history, but admited of living with person who smoked for 40 years. Blood cultures were negative 2, sputum cultures not obtained. Patient was treated with steroids, inhalers,  but still remained hypoxic on room air with oxygen saturations in 80s. CT angiogram was performed, and showed no emphysema, but chronic bronchitis with bronchiectasis, likely COPD with chronic bronchitis, qualifying her for oxygen therapy at home. Incentive spirometry was initiated. While in the hospital patient complained of intermittent nausea and vomiting, xray showed ileus. Gastroenterology was consulted and Colestid was discontinued.  With conservative therapy nausea and vomiting resolved  .  Discussion by problem: 1. Dyspnea, multifactorial likely due to acute diastolic congestive heart failure exacerbation, also COPD/chronic bronchitis exacerbation, patient was given  Lasix as needed, incentive spirometry was initiated . Patient was advised to continue nebulizing , oxygen therapy as needed, Echocardiogram was performed, was found to be normal. 2. COPD/chronic bronchitis with bronchiectasis exacerbation, taper steroids, continue inhalation therapy with Advair , albuterol and tiotropium, improved clinically. Pre-qualified for O2 at home.  3. Chronic bronchitis exacerbation, sputum cultures not reported, continue patient on Humibid, levofloxacin, continue Robitussin and Tussionex to help with dry cough, improved 4. Elevated troponin, likely demand ischemia, normal echocardiogram , appreciate cardiology input , stress test revealed a normal myocardial perfusion without evidence of myocardial ischemia, follow up with cardiology as outpatient.  5. Hyponatremia, resolved 6. Renal insufficiency, resolved initially with IV fluid administration, unremarkable urinalysis, follow as outpatient.  7. Anemia. Hemoccult ordered,, not received, , continue aspirin, follow Hgb closely as outpatient 8.  Thrombocytopenia, worse with heparin infusion, discontinued heparin, and improved platelet count   VITAL SIGNS:  Blood pressure 127/63, pulse 74, temperature 98 F (36.7 C), temperature source Oral, resp. rate 18, height 5\' 4"  (1.626 m), weight 76.658 kg (169 lb), SpO2 96 %.  I/O:  No intake or output data in the 24 hours ending 08/09/15 1434  PHYSICAL EXAMINATION:  GENERAL:  75 y.o.-year-old patient lying in the bed with no acute distress.  EYES: Pupils equal, round, reactive to light and accommodation. No scleral icterus. Extraocular muscles intact.  HEENT: Head atraumatic, normocephalic. Oropharynx and nasopharynx clear.  NECK:  Supple, no jugular venous distention. No thyroid enlargement, no tenderness.  LUNGS: Normal breath sounds bilaterally, no wheezing, rales,rhonchi or crepitation. No use of accessory muscles of respiration.  CARDIOVASCULAR: S1, S2 normal. No murmurs, rubs, or gallops.  ABDOMEN: Soft, non-tender, non-distended. Bowel sounds present. No organomegaly or mass.  EXTREMITIES: No pedal edema, cyanosis, or clubbing.  NEUROLOGIC: Cranial nerves II through XII are intact. Muscle strength 5/5 in all extremities. Sensation intact. Gait not checked.  PSYCHIATRIC: The patient is alert and oriented x 3.  SKIN: No obvious rash, lesion, or ulcer.   DATA REVIEW:   CBC  Recent  Labs Lab 08/05/15 0533  WBC 4.1  HGB 10.2*  HCT 29.7*  PLT 137*    Chemistries   Recent Labs Lab 08/03/15 0421 08/04/15 0606  NA 131* 135  K 3.5 4.1  CL 96* 103  CO2 23 26  GLUCOSE 93 116*  BUN 11 10  CREATININE 1.01* 0.84  CALCIUM 8.2* 8.1*  AST 69*  --   ALT 53  --   ALKPHOS 38  --   BILITOT 0.7  --     Cardiac Enzymes  Recent Labs Lab 08/03/15 2136  TROPONINI 0.12*    Microbiology Results  Results for orders placed or performed during the hospital encounter of 08/03/15  Blood culture (routine x 2)     Status: None   Collection Time: 08/03/15  4:23 AM  Result Value  Ref Range Status   Specimen Description BLOOD LEFT ARM  Final   Special Requests BOTTLES DRAWN AEROBIC AND ANAEROBIC 4CC  Final   Culture NO GROWTH 5 DAYS  Final   Report Status 08/08/2015 FINAL  Final  Blood culture (routine x 2)     Status: None   Collection Time: 08/03/15  4:25 AM  Result Value Ref Range Status   Specimen Description BLOOD LEFT ASSIST CONTROL  Final   Special Requests BOTTLES DRAWN AEROBIC AND ANAEROBIC 4CCAERO,4CCANA  Final   Culture NO GROWTH 5 DAYS  Final   Report Status 08/08/2015 FINAL  Final    RADIOLOGY:  Dg Knee Complete 4 Views Right  08/07/2015  CLINICAL DATA:  Knee pain and unable to bear weight.  No injury. EXAM: RIGHT KNEE - COMPLETE 4+ VIEW COMPARISON:  None. FINDINGS: No evidence of fracture. No subluxation or dislocation. Hypertrophic spurring is seen in the medial and patellofemoral compartments. Joint effusion is evident in the suprapatellar bursa. IMPRESSION: Degenerative changes with joint effusion. Electronically Signed   By: Misty Stanley M.D.   On: 08/07/2015 23:09    EKG:   Orders placed or performed during the hospital encounter of 08/03/15  . EKG 12-Lead  . EKG 12-Lead  . ED EKG  . ED EKG  . EKG      Management plans discussed with the patient, family and they are in agreement.  CODE STATUS:  Code Status History    Date Active Date Inactive Code Status Order ID Comments User Context   08/03/2015  9:49 AM 08/07/2015  2:11 PM Full Code [id number]  Saundra Shelling, MD Inpatient      TOTAL TIME TAKING CARE OF THIS PATIENT: 40 minutes.    Theodoro Grist M.D on 08/09/2015 at 2:34 PM  Between 7am to 6pm - Pager - [phone]  After 6pm go to [email or web address] - password [email or web address] [facility]  Office  [phone]  CC: Primary care physician; [name], MD

## 2015-08-10 ENCOUNTER — Ambulatory Visit (INDEPENDENT_AMBULATORY_CARE_PROVIDER_SITE_OTHER): Payer: Medicare Other | Admitting: Internal Medicine

## 2015-08-10 ENCOUNTER — Encounter: Payer: Self-pay | Admitting: Internal Medicine

## 2015-08-10 ENCOUNTER — Ambulatory Visit: Payer: Medicare Other | Admitting: Internal Medicine

## 2015-08-10 VITALS — BP 100/60 | HR 59 | Temp 97.6°F | Resp 17 | Ht 64.0 in | Wt 169.2 lb

## 2015-08-10 DIAGNOSIS — I10 Essential (primary) hypertension: Secondary | ICD-10-CM

## 2015-08-10 DIAGNOSIS — K219 Gastro-esophageal reflux disease without esophagitis: Secondary | ICD-10-CM

## 2015-08-10 DIAGNOSIS — D649 Anemia, unspecified: Secondary | ICD-10-CM | POA: Diagnosis not present

## 2015-08-10 DIAGNOSIS — Z87898 Personal history of other specified conditions: Secondary | ICD-10-CM | POA: Diagnosis not present

## 2015-08-10 DIAGNOSIS — E7800 Pure hypercholesterolemia, unspecified: Secondary | ICD-10-CM

## 2015-08-10 DIAGNOSIS — R778 Other specified abnormalities of plasma proteins: Secondary | ICD-10-CM

## 2015-08-10 DIAGNOSIS — R7989 Other specified abnormal findings of blood chemistry: Secondary | ICD-10-CM

## 2015-08-10 DIAGNOSIS — M069 Rheumatoid arthritis, unspecified: Secondary | ICD-10-CM | POA: Diagnosis not present

## 2015-08-10 DIAGNOSIS — J441 Chronic obstructive pulmonary disease with (acute) exacerbation: Secondary | ICD-10-CM | POA: Diagnosis not present

## 2015-08-10 DIAGNOSIS — R945 Abnormal results of liver function studies: Secondary | ICD-10-CM

## 2015-08-10 MED ORDER — ALPRAZOLAM 0.25 MG PO TABS: ORAL_TABLET | ORAL | Status: DC

## 2015-08-10 MED ORDER — FIRST-DUKES MOUTHWASH MT SUSP: OROMUCOSAL | Status: DC

## 2015-08-10 MED ORDER — FLUTICASONE-SALMETEROL 250-50 MCG/DOSE IN AEPB: INHALATION_SPRAY | RESPIRATORY_TRACT | Status: DC

## 2015-08-10 NOTE — Progress Notes (Signed)
Pre-visit discussion using our clinic review tool. No additional management support is needed unless otherwise documented below in the visit note.  

## 2015-08-11 LAB — BASIC METABOLIC PANEL
BUN: 21 mg/dL (ref 6–23)
CHLORIDE: 97 meq/L (ref 96–112)
CO2: 26 mEq/L (ref 19–32)
Calcium: 8.6 mg/dL (ref 8.4–10.5)
Creatinine, Ser: 1.12 mg/dL (ref 0.40–1.20)
GFR: 50.78 mL/min — ABNORMAL LOW (ref >60.00)
GLUCOSE: 171 mg/dL — AB (ref 70–99)
POTASSIUM: 4.2 meq/L (ref 3.5–5.1)
Sodium: 132 mEq/L — ABNORMAL LOW (ref 135–145)

## 2015-08-11 LAB — HEPATIC FUNCTION PANEL
ALT: 27 U/L (ref 0–35)
AST: 29 U/L (ref 0–37)
Albumin: 3.6 g/dL (ref 3.5–5.2)
Alkaline Phosphatase: 34 U/L — ABNORMAL LOW (ref 39–117)
BILIRUBIN DIRECT: 0.1 mg/dL (ref 0.0–0.3)
BILIRUBIN TOTAL: 0.5 mg/dL (ref 0.2–1.2)
Total Protein: 6.8 g/dL (ref 6.0–8.3)

## 2015-08-11 LAB — CBC WITH DIFFERENTIAL/PLATELET
BASOS PCT: 0.3 % (ref 0.0–3.0)
Basophils Absolute: 0 10*3/uL (ref 0.0–0.1)
EOS ABS: 0 10*3/uL (ref 0.0–0.7)
Eosinophils Relative: 0 % (ref 0.0–5.0)
HCT: 32.5 % — ABNORMAL LOW (ref 36.0–46.0)
Hemoglobin: 10.6 g/dL — ABNORMAL LOW (ref 12.0–15.0)
Lymphocytes Relative: 7.9 % — ABNORMAL LOW (ref 12.0–46.0)
Lymphs Abs: 0.8 10*3/uL (ref 0.7–4.0)
MCHC: 32.5 g/dL (ref 30.0–36.0)
MCV: 94.1 fl (ref 78.0–100.0)
MONO ABS: 0.5 10*3/uL (ref 0.1–1.0)
Monocytes Relative: 4.6 % (ref 3.0–12.0)
NEUTROS ABS: 9.1 10*3/uL — AB (ref 1.4–7.7)
PLATELETS: 214 10*3/uL (ref 150.0–400.0)
RBC: 3.45 Mil/uL — ABNORMAL LOW (ref 3.87–5.11)
RDW: 13.6 % (ref 11.5–15.5)
WBC: 10.4 10*3/uL (ref 4.0–10.5)

## 2015-08-12 ENCOUNTER — Other Ambulatory Visit: Payer: Self-pay | Admitting: Internal Medicine

## 2015-08-12 ENCOUNTER — Telehealth: Payer: Self-pay | Admitting: Internal Medicine

## 2015-08-12 DIAGNOSIS — I5031 Acute diastolic (congestive) heart failure: Secondary | ICD-10-CM | POA: Diagnosis not present

## 2015-08-12 DIAGNOSIS — I248 Other forms of acute ischemic heart disease: Secondary | ICD-10-CM | POA: Diagnosis not present

## 2015-08-12 DIAGNOSIS — I110 Hypertensive heart disease with heart failure: Secondary | ICD-10-CM | POA: Diagnosis not present

## 2015-08-12 DIAGNOSIS — E871 Hypo-osmolality and hyponatremia: Secondary | ICD-10-CM

## 2015-08-12 DIAGNOSIS — I361 Nonrheumatic tricuspid (valve) insufficiency: Secondary | ICD-10-CM | POA: Diagnosis not present

## 2015-08-12 DIAGNOSIS — I340 Nonrheumatic mitral (valve) insufficiency: Secondary | ICD-10-CM | POA: Diagnosis not present

## 2015-08-12 DIAGNOSIS — J471 Bronchiectasis with (acute) exacerbation: Secondary | ICD-10-CM | POA: Diagnosis not present

## 2015-08-12 NOTE — Progress Notes (Signed)
Order placed for f/u lab.   

## 2015-08-12 NOTE — Telephone Encounter (Signed)
Pt called needing a prescription for Diphenhyd-Hydrocort-Nystatin (FIRST-DUKES MOUTHWASH) SUSP. Pharmacy is [facility], [place]. Call pt @ [phone]. Thank you!

## 2015-08-12 NOTE — Telephone Encounter (Signed)
Already sent to pharmacy 

## 2015-08-13 DIAGNOSIS — I110 Hypertensive heart disease with heart failure: Secondary | ICD-10-CM | POA: Diagnosis not present

## 2015-08-13 DIAGNOSIS — J471 Bronchiectasis with (acute) exacerbation: Secondary | ICD-10-CM | POA: Diagnosis not present

## 2015-08-13 DIAGNOSIS — I5031 Acute diastolic (congestive) heart failure: Secondary | ICD-10-CM | POA: Diagnosis not present

## 2015-08-13 DIAGNOSIS — I248 Other forms of acute ischemic heart disease: Secondary | ICD-10-CM | POA: Diagnosis not present

## 2015-08-13 DIAGNOSIS — I340 Nonrheumatic mitral (valve) insufficiency: Secondary | ICD-10-CM | POA: Diagnosis not present

## 2015-08-13 DIAGNOSIS — I361 Nonrheumatic tricuspid (valve) insufficiency: Secondary | ICD-10-CM | POA: Diagnosis not present

## 2015-08-17 ENCOUNTER — Encounter: Payer: Self-pay | Admitting: Internal Medicine

## 2015-08-17 DIAGNOSIS — B370 Candidal stomatitis: Secondary | ICD-10-CM | POA: Diagnosis not present

## 2015-08-17 DIAGNOSIS — I10 Essential (primary) hypertension: Secondary | ICD-10-CM | POA: Diagnosis not present

## 2015-08-17 DIAGNOSIS — E782 Mixed hyperlipidemia: Secondary | ICD-10-CM | POA: Diagnosis not present

## 2015-08-17 DIAGNOSIS — E7800 Pure hypercholesterolemia, unspecified: Secondary | ICD-10-CM | POA: Diagnosis not present

## 2015-08-17 NOTE — Assessment & Plan Note (Signed)
Was admitted with what was felt to be (in part) COPD exacerbation.  Treated with inhalers and steroids.  On O2 now.  Breathing better.  Follow.  Continue inhalers.

## 2015-08-17 NOTE — Assessment & Plan Note (Signed)
Followed by Dr [name].  Will need to be cleared from recent infection prior to next infusion.

## 2015-08-17 NOTE — Assessment & Plan Note (Signed)
hgb decreased in the hospital.  Recheck cbc today.

## 2015-08-17 NOTE — Assessment & Plan Note (Signed)
Was admitted and found to have elevated troponin on admission labs.  Cardiology evaluated.  ECHO ok.  Stress test negative for ischemia.  Was given lasix prn.  Due to f/u with cardiology.  Currently breathing better.  No chest pain.

## 2015-08-17 NOTE — Assessment & Plan Note (Signed)
On simvastatin.  Low cholesterol diet and exercise.  Follow lipid panel and liver function tests.   

## 2015-08-17 NOTE — Progress Notes (Signed)
Patient ID: Kaitlyn Huff, female   DOB: [birth year], 75 y.o.   MRN: [id number]   Subjective:    Patient ID: Kaitlyn Huff, female    DOB: Nov 12, 1940, 75 y.o.   MRN: [id number]  HPI  Patient with past history of RA, GERD, hypertension and hypercholesterolemia.  She comes in today for a hospital follow up.  She was admitted 08/03/15 - 08/07/15 with acute respiratory failure with hypoxia.  Was diagnosed with pneumonia prior to admission.  Placed on levaquin.  Symptoms worsened.  To ER with sob.  Found to have slightly elevated troponin.  Evaluated by cardiology.  Echo ok.  Stress test - no ischemia.  CT angiogram - chronic bronchitis with bronchiectasis.  Was discharged home on O2.  Was placed on IV abx, steroids and inhalers.  Was given lasix prn.  She comes in today for hospital follow up.  States she is breathing better.  Cough and congestion better.  Eating and drinking.  No nausea or vomiting.  Bowels stable.  She is accompanied by her daughter.  History obtained from both of them.  Energy is improving.     Past Medical History  Diagnosis Date  . Rheumatoid arthritis(714.0)     MTX transaminitis, Leflunomide (rash), enbrel, plaquinil, prednisone, remicade, Imuran (transaminitis)  . Hyperlipidemia   . Diverticulitis   . Osteoarthritis   . GERD (gastroesophageal reflux disease)   . Hypertension   . Anemia   . Positive PPD     s/p INH (2006)  . Osteoporosis     actonel  . 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 Abdominal hysterectomy    . Tracheostomy  1959  . Tubal ligation    . Cervical cone biopsy    . Ovary surgery    . Cholecystectomy  06/22/14  . Colonoscopy with propofol N/A 07/09/2015    Procedure: COLONOSCOPY WITH PROPOFOL;  Surgeon: Manya Silvas, MD;  Location: [facility] ENDOSCOPY;  Service: Endoscopy;  Laterality: N/A;  . Esophagogastroduodenoscopy (egd) with propofol N/A 07/09/2015    Procedure:  ESOPHAGOGASTRODUODENOSCOPY (EGD) WITH PROPOFOL;  Surgeon: Manya Silvas, MD;  Location: [facility] ENDOSCOPY;  Service: Endoscopy;  Laterality: N/A;   Family History  Problem Relation Age of Onset  . Heart disease Father     MI  . Heart disease Mother   . Valvular heart disease Mother   . Colon cancer Neg Hx   . Breast cancer Sister 90   Social History   Social History  . Marital Status: Widowed    Spouse Name: N/A  . Number of Children: 4  . Years of Education: N/A   Occupational History  . retired    Social History Main Topics  . Smoking status: Never Smoker   . Smokeless tobacco: Never Used  . Alcohol Use: No  . Drug Use: No  . Sexual Activity: Not Asked   Other Topics Concern  . None   Social History Narrative   Lives with family at home    Outpatient Encounter Prescriptions as of 08/10/2015  Medication Sig  . albuterol (PROVENTIL HFA;VENTOLIN HFA) 108 (90 Base) MCG/ACT inhaler Inhale 2 puffs into the lungs every 4 (four) hours as needed for wheezing or shortness of breath.  Marland Kitchen albuterol (PROVENTIL) (2.5 MG/3ML) 0.083% nebulizer solution Take 3 mLs (2.5 mg total) by nebulization every 4 (four) hours.  . ALPRAZolam (XANAX) 0.25 MG tablet 1 tablet q day  prn  . aspirin 81 MG tablet Take 81 mg by mouth daily.  . calcium-vitamin D (OSCAL 500/200 D-3) 500-200 MG-UNIT per tablet Take 1 tablet by mouth 2 (two) times daily.  . cetirizine (ZYRTEC) 10 MG tablet TAKE 1 TABLET DAILY  . chlorpheniramine-HYDROcodone (TUSSIONEX) 10-8 MG/5ML SUER Take 5 mLs by mouth every 12 (twelve) hours as needed for cough.  . FeFum-FePo-FA-B Cmp-C-Zn-Mn-Cu (TANDEM PLUS) 162-115.2-1 MG CAPS TAKE 1 CAPSULE DAILY  . Fluticasone-Salmeterol (ADVAIR DISKUS) 250-50 MCG/DOSE AEPB USE 1 INHALATION INTO THE LUNGS EVERY 12 HOURS  . furosemide (LASIX) 20 MG tablet TAKE 1 TABLET DAILY  . gabapentin (NEURONTIN) 300 MG capsule Take 300 mg by mouth 4 (four) times daily.  Marland Kitchen guaiFENesin (MUCINEX) 600 MG 12 hr tablet  Take 1 tablet (600 mg total) by mouth 2 (two) times daily.  Marland Kitchen guaiFENesin-dextromethorphan (ROBITUSSIN DM) 100-10 MG/5ML syrup Take 5 mLs by mouth every 4 (four) hours as needed for cough.  . hydroxychloroquine (PLAQUENIL) 200 MG tablet Take 1 tablet (200 mg total) by mouth 2 (two) times daily.  . InFLIXimab (REMICADE IV) Inject every 6 weeks  . metoprolol succinate (TOPROL-XL) 50 MG 24 hr tablet TAKE 1 TABLET (50 MG TOTAL) DAILY. TAKE WITH OR IMMEDIATELY FOLLOWING A MEAL  . Multiple Vitamin (MULTIVITAMIN) capsule Take 1 capsule by mouth daily.  Marland Kitchen NEXIUM 40 MG capsule TAKE 1 CAPSULE TWICE A DAY BEFORE A MEAL  . potassium chloride (MICRO-K) 10 MEQ CR capsule TAKE 2 CAPSULES TWICE A DAY  . pramipexole (MIRAPEX) 0.5 MG tablet Take 1 tablet (0.5 mg total) by mouth 2 (two) times daily as needed (restless legs).  . pramipexole (MIRAPEX) 1 MG tablet Take 1 tablet (1 mg total) by mouth at bedtime.  . predniSONE (STERAPRED UNI-PAK 21 TAB) 10 MG (21) TBPK tablet Take 1 tablet (10 mg total) by mouth daily. Please take 6 pills in the morning on the first day then taper by one pill every 2 days until finished. Thank you  . simvastatin (ZOCOR) 20 MG tablet TAKE 1 TABLET DAILY  . sodium chloride (OCEAN) 0.65 % nasal spray Place 1 spray into the nose as needed for congestion.  Marland Kitchen tiotropium (SPIRIVA) 18 MCG inhalation capsule Place 1 capsule (18 mcg total) into inhaler and inhale daily.  . traMADol (ULTRAM) 50 MG tablet Take 1 tablet (50 mg total) by mouth at bedtime as needed. (Patient taking differently: Take 50 mg by mouth 2 (two) times daily. )  . [DISCONTINUED] ADVAIR DISKUS 250-50 MCG/DOSE AEPB USE 1 INHALATION INTO THE LUNGS EVERY 12 HOURS  . [DISCONTINUED] ALPRAZolam (XANAX) 0.25 MG tablet 1 tablet q day prn  . Diphenhyd-Hydrocort-Nystatin (FIRST-DUKES MOUTHWASH) SUSP 5cc's swish and spit tid   No facility-administered encounter medications on file as of 08/10/2015.    Review of Systems  Constitutional:  Positive for fatigue. Negative for appetite change and unexpected weight change.  HENT: Negative for congestion and sinus pressure.   Respiratory: Negative for chest tightness and shortness of breath.        Cough and congestion better.    Cardiovascular: Negative for chest pain, palpitations and leg swelling.  Gastrointestinal: Negative for nausea, vomiting and abdominal pain.  Genitourinary: Negative for dysuria and difficulty urinating.  Musculoskeletal: Negative for back pain.       Joints stable.    Skin: Negative for color change and rash.  Neurological: Negative for dizziness, light-headedness and headaches.  Psychiatric/Behavioral: Negative for dysphoric mood and agitation.  Objective:    Physical Exam  Constitutional: She appears well-developed and well-nourished. No distress.  HENT:  Nose: Nose normal.  Mouth/Throat: Oropharynx is clear and moist.  Eyes: Conjunctivae are normal. Right eye exhibits no discharge. Left eye exhibits no discharge.  Neck: Neck supple. No thyromegaly present.  Cardiovascular: Normal rate and regular rhythm.   Pulmonary/Chest: Breath sounds normal. No respiratory distress. She has no wheezes.  Abdominal: Soft. Bowel sounds are normal. There is no tenderness.  Musculoskeletal: She exhibits no edema or tenderness.  Lymphadenopathy:    She has no cervical adenopathy.  Skin: No rash noted. No erythema.  Psychiatric: She has a normal mood and affect. Her behavior is normal.    BP 100/60 mmHg  Pulse 59  Temp(Src) 97.6 F (36.4 C) (Oral)  Resp 17  Ht 5\' 4"  (1.626 m)  Wt 169 lb 4 oz (76.771 kg)  BMI 29.04 kg/m2  SpO2 96% Wt Readings from Last 3 Encounters:  08/10/15 169 lb 4 oz (76.771 kg)  08/07/15 169 lb (76.658 kg)  08/03/15 169 lb (76.658 kg)     Lab Results  Component Value Date   WBC 10.4 08/10/2015   HGB 10.6* 08/10/2015   HCT 32.5* 08/10/2015   PLT 214.0 08/10/2015   GLUCOSE 171* 08/10/2015   CHOL 92 08/04/2015   TRIG  53 08/04/2015   HDL 51 08/04/2015   LDLDIRECT 73.7 08/20/2013   LDLCALC 30 08/04/2015   ALT 27 08/10/2015   AST 29 08/10/2015   NA 132* 08/10/2015   K 4.2 08/10/2015   CL 97 08/10/2015   CREATININE 1.12 08/10/2015   BUN 21 08/10/2015   CO2 26 08/10/2015   TSH 2.72 04/13/2015   INR 1.07 08/03/2015        Assessment & Plan:   Problem List Items Addressed This Visit    Abnormal liver function test    Previous abdominal ultrasound revealed fatty liver.  Recheck liver panel today.       Relevant Orders   Hepatic function panel (Completed)   Anemia    hgb decreased in the hospital.  Recheck cbc today.        Relevant Orders   CBC with Differential/Platelet (Completed)   COPD exacerbation ([place])    Was admitted with what was felt to be (in part) COPD exacerbation.  Treated with inhalers and steroids.  On O2 now.  Breathing better.  Follow.  Continue inhalers.       Relevant Medications   Fluticasone-Salmeterol (ADVAIR DISKUS) 250-50 MCG/DOSE AEPB   Elevated troponin    Was admitted and found to have elevated troponin on admission labs.  Cardiology evaluated.  ECHO ok.  Stress test negative for ischemia.  Was given lasix prn.  Due to f/u with cardiology.  Currently breathing better.  No chest pain.        GERD (gastroesophageal reflux disease)    Upper symptoms controlled.  On nexium.       Hypercholesterolemia    On simvastatin.  Low cholesterol diet and exercise.  Follow lipid panel and liver function tests.        Hypertension - Primary    Blood pressure has been under good control.  Continue same medication regimen.  Follow pressures.  Follow metabolic panel.        Relevant Orders   Basic metabolic panel (Completed)   Rheumatoid arthritis ([place])    Followed by Dr [name].  Will need to be cleared from recent infection prior to next infusion.  [name], MD

## 2015-08-17 NOTE — Assessment & Plan Note (Signed)
Previous abdominal ultrasound revealed fatty liver.  Recheck liver panel today.

## 2015-08-17 NOTE — Assessment & Plan Note (Signed)
Blood pressure has been under good control.  Continue same medication regimen.  Follow pressures.  Follow metabolic panel.   

## 2015-08-17 NOTE — Assessment & Plan Note (Addendum)
Upper symptoms controlled.  On nexium.   

## 2015-08-18 ENCOUNTER — Emergency Department: Payer: Medicare Other

## 2015-08-18 ENCOUNTER — Encounter: Payer: Self-pay | Admitting: *Deleted

## 2015-08-18 ENCOUNTER — Inpatient Hospital Stay
Admission: EM | Admit: 2015-08-18 | Discharge: 2015-08-22 | DRG: 871 | Disposition: A | Discharge status: Home Health | Payer: Medicare Other | Attending: Internal Medicine | Admitting: Internal Medicine

## 2015-08-18 DIAGNOSIS — J181 Lobar pneumonia, unspecified organism: Secondary | ICD-10-CM | POA: Diagnosis not present

## 2015-08-18 DIAGNOSIS — Z888 Allergy status to other drugs, medicaments and biological substances status: Secondary | ICD-10-CM | POA: Diagnosis not present

## 2015-08-18 DIAGNOSIS — E119 Type 2 diabetes mellitus without complications: Secondary | ICD-10-CM | POA: Diagnosis present

## 2015-08-18 DIAGNOSIS — I248 Other forms of acute ischemic heart disease: Secondary | ICD-10-CM | POA: Diagnosis present

## 2015-08-18 DIAGNOSIS — E872 Acidosis: Secondary | ICD-10-CM | POA: Diagnosis present

## 2015-08-18 DIAGNOSIS — Z881 Allergy status to other antibiotic agents status: Secondary | ICD-10-CM | POA: Diagnosis not present

## 2015-08-18 DIAGNOSIS — J189 Pneumonia, unspecified organism: Secondary | ICD-10-CM | POA: Diagnosis present

## 2015-08-18 DIAGNOSIS — R7989 Other specified abnormal findings of blood chemistry: Secondary | ICD-10-CM | POA: Diagnosis not present

## 2015-08-18 DIAGNOSIS — Z885 Allergy status to narcotic agent status: Secondary | ICD-10-CM | POA: Diagnosis not present

## 2015-08-18 DIAGNOSIS — Z79891 Long term (current) use of opiate analgesic: Secondary | ICD-10-CM | POA: Diagnosis not present

## 2015-08-18 DIAGNOSIS — K219 Gastro-esophageal reflux disease without esophagitis: Secondary | ICD-10-CM | POA: Diagnosis present

## 2015-08-18 DIAGNOSIS — Y95 Nosocomial condition: Secondary | ICD-10-CM | POA: Diagnosis present

## 2015-08-18 DIAGNOSIS — E785 Hyperlipidemia, unspecified: Secondary | ICD-10-CM | POA: Diagnosis present

## 2015-08-18 DIAGNOSIS — I10 Essential (primary) hypertension: Secondary | ICD-10-CM | POA: Diagnosis present

## 2015-08-18 DIAGNOSIS — Z794 Long term (current) use of insulin: Secondary | ICD-10-CM | POA: Diagnosis not present

## 2015-08-18 DIAGNOSIS — R0602 Shortness of breath: Secondary | ICD-10-CM | POA: Diagnosis not present

## 2015-08-18 DIAGNOSIS — Z7982 Long term (current) use of aspirin: Secondary | ICD-10-CM

## 2015-08-18 DIAGNOSIS — G2581 Restless legs syndrome: Secondary | ICD-10-CM | POA: Diagnosis present

## 2015-08-18 DIAGNOSIS — Z7951 Long term (current) use of inhaled steroids: Secondary | ICD-10-CM | POA: Diagnosis not present

## 2015-08-18 DIAGNOSIS — M069 Rheumatoid arthritis, unspecified: Secondary | ICD-10-CM | POA: Diagnosis present

## 2015-08-18 DIAGNOSIS — R748 Abnormal levels of other serum enzymes: Secondary | ICD-10-CM | POA: Diagnosis not present

## 2015-08-18 DIAGNOSIS — A419 Sepsis, unspecified organism: Principal | ICD-10-CM | POA: Diagnosis present

## 2015-08-18 DIAGNOSIS — Z882 Allergy status to sulfonamides status: Secondary | ICD-10-CM

## 2015-08-18 DIAGNOSIS — Z79899 Other long term (current) drug therapy: Secondary | ICD-10-CM | POA: Diagnosis not present

## 2015-08-18 DIAGNOSIS — Z7952 Long term (current) use of systemic steroids: Secondary | ICD-10-CM

## 2015-08-18 DIAGNOSIS — E7800 Pure hypercholesterolemia, unspecified: Secondary | ICD-10-CM | POA: Diagnosis present

## 2015-08-18 DIAGNOSIS — Z9981 Dependence on supplemental oxygen: Secondary | ICD-10-CM

## 2015-08-18 DIAGNOSIS — Z880 Allergy status to penicillin: Secondary | ICD-10-CM

## 2015-08-18 DIAGNOSIS — M1990 Unspecified osteoarthritis, unspecified site: Secondary | ICD-10-CM | POA: Diagnosis present

## 2015-08-18 DIAGNOSIS — J440 Chronic obstructive pulmonary disease with acute lower respiratory infection: Secondary | ICD-10-CM | POA: Diagnosis present

## 2015-08-18 DIAGNOSIS — M810 Age-related osteoporosis without current pathological fracture: Secondary | ICD-10-CM | POA: Diagnosis present

## 2015-08-18 DIAGNOSIS — R509 Fever, unspecified: Secondary | ICD-10-CM | POA: Diagnosis not present

## 2015-08-18 DIAGNOSIS — R197 Diarrhea, unspecified: Secondary | ICD-10-CM | POA: Diagnosis present

## 2015-08-18 DIAGNOSIS — R112 Nausea with vomiting, unspecified: Secondary | ICD-10-CM | POA: Diagnosis not present

## 2015-08-18 LAB — CBC
HCT: 34.5 % — ABNORMAL LOW (ref 35.0–47.0)
HEMOGLOBIN: 11.6 g/dL — AB (ref 12.0–16.0)
MCH: 30.8 pg (ref 26.0–34.0)
MCHC: 33.6 g/dL (ref 32.0–36.0)
MCV: 91.7 fL (ref 80.0–100.0)
PLATELETS: 219 10*3/uL (ref 150–440)
RBC: 3.76 MIL/uL — AB (ref 3.80–5.20)
RDW: 13.2 % (ref 11.5–14.5)
WBC: 26.9 10*3/uL — AB (ref 3.6–11.0)

## 2015-08-18 LAB — URINALYSIS COMPLETE WITH MICROSCOPIC (ARMC ONLY)
BACTERIA UA: NONE SEEN
Bilirubin Urine: NEGATIVE
Glucose, UA: NEGATIVE mg/dL
HGB URINE DIPSTICK: NEGATIVE
Ketones, ur: NEGATIVE mg/dL
Nitrite: NEGATIVE
PH: 7 (ref 5.0–8.0)
PROTEIN: NEGATIVE mg/dL
Specific Gravity, Urine: 1.013 (ref 1.005–1.030)

## 2015-08-18 LAB — COMPREHENSIVE METABOLIC PANEL
ALK PHOS: 46 U/L (ref 38–126)
ALT: 29 U/L (ref 14–54)
AST: 41 U/L (ref 15–41)
Albumin: 3.4 g/dL — ABNORMAL LOW (ref 3.5–5.0)
Anion gap: 9 (ref 5–15)
BUN: 19 mg/dL (ref 6–20)
CALCIUM: 8.7 mg/dL — AB (ref 8.9–10.3)
CHLORIDE: 101 mmol/L (ref 101–111)
CO2: 28 mmol/L (ref 22–32)
CREATININE: 1.19 mg/dL — AB (ref 0.44–1.00)
GFR calc Af Amer: 52 mL/min — ABNORMAL LOW (ref >60)
GFR calc non Af Amer: 44 mL/min — ABNORMAL LOW (ref >60)
Glucose, Bld: 116 mg/dL — ABNORMAL HIGH (ref 65–99)
Potassium: 3.8 mmol/L (ref 3.5–5.1)
SODIUM: 138 mmol/L (ref 135–145)
Total Bilirubin: 0.8 mg/dL (ref 0.3–1.2)
Total Protein: 6.5 g/dL (ref 6.5–8.1)

## 2015-08-18 LAB — TROPONIN I
TROPONIN I: 0.12 ng/mL — AB (ref <0.031)
Troponin I: 0.11 ng/mL — ABNORMAL HIGH (ref <0.031)

## 2015-08-18 LAB — LACTIC ACID, PLASMA
Lactic Acid, Venous: 2.5 mmol/L (ref 0.5–2.0)
Lactic Acid, Venous: 2.3 mmol/L (ref 0.5–2.0)

## 2015-08-18 LAB — RAPID INFLUENZA A&B ANTIGENS (ARMC ONLY)
INFLUENZA A (ARMC): NEGATIVE
INFLUENZA B (ARMC): NEGATIVE

## 2015-08-18 LAB — PROCALCITONIN: PROCALCITONIN: 12.05 ng/mL

## 2015-08-18 LAB — LIPASE, BLOOD: Lipase: 48 U/L (ref 11–51)

## 2015-08-18 MED ORDER — TIOTROPIUM BROMIDE MONOHYDRATE 18 MCG IN CAPS
18.0000 ug | ORAL_CAPSULE | Freq: Every day | RESPIRATORY_TRACT | Status: DC
  Administered 2015-08-18 – 2015-08-22 (×5): 18 ug via RESPIRATORY_TRACT
  Filled 2015-08-18 (×2): qty 5

## 2015-08-18 MED ORDER — COLESTIPOL HCL 1 G PO TABS
2.0000 g | ORAL_TABLET | Freq: Two times a day (BID) | ORAL | Status: DC
  Administered 2015-08-19 – 2015-08-22 (×6): 2 g via ORAL
  Filled 2015-08-18 (×7): qty 2

## 2015-08-18 MED ORDER — POTASSIUM CHLORIDE CRYS ER 20 MEQ PO TBCR
20.0000 meq | EXTENDED_RELEASE_TABLET | Freq: Every day | ORAL | Status: DC
  Administered 2015-08-18 – 2015-08-22 (×5): 20 meq via ORAL
  Filled 2015-08-18 (×5): qty 1

## 2015-08-18 MED ORDER — LEVOFLOXACIN IN D5W 750 MG/150ML IV SOLN
750.0000 mg | Freq: Once | INTRAVENOUS | Status: AC
  Administered 2015-08-18: 750 mg via INTRAVENOUS
  Filled 2015-08-18: qty 150

## 2015-08-18 MED ORDER — DEXTROSE 5 % IV SOLN
2.0000 g | Freq: Two times a day (BID) | INTRAVENOUS | Status: DC
  Administered 2015-08-19 – 2015-08-21 (×6): 2 g via INTRAVENOUS
  Filled 2015-08-18 (×7): qty 2

## 2015-08-18 MED ORDER — SODIUM CHLORIDE 0.9 % IV BOLUS (SEPSIS)
1000.0000 mL | Freq: Once | INTRAVENOUS | Status: AC
  Administered 2015-08-18: 1000 mL via INTRAVENOUS

## 2015-08-18 MED ORDER — TANDEM PLUS 162-115.2-1 MG PO CAPS: 1.0000 | ORAL_CAPSULE | Freq: Every day | ORAL | Status: DC

## 2015-08-18 MED ORDER — IOHEXOL 240 MG/ML SOLN: 25.0000 mL | Freq: Once | INTRAMUSCULAR | Status: DC | PRN

## 2015-08-18 MED ORDER — HYDROXYCHLOROQUINE SULFATE 200 MG PO TABS
200.0000 mg | ORAL_TABLET | Freq: Two times a day (BID) | ORAL | Status: DC
  Administered 2015-08-18 – 2015-08-22 (×8): 200 mg via ORAL
  Filled 2015-08-18 (×8): qty 1

## 2015-08-18 MED ORDER — VANCOMYCIN HCL IN DEXTROSE 1-5 GM/200ML-% IV SOLN
1000.0000 mg | Freq: Once | INTRAVENOUS | Status: AC
  Administered 2015-08-19: 1000 mg via INTRAVENOUS
  Filled 2015-08-18: qty 200

## 2015-08-18 MED ORDER — LEVOFLOXACIN IN D5W 750 MG/150ML IV SOLN: 750.0000 mg | INTRAVENOUS | Status: DC

## 2015-08-18 MED ORDER — CALCIUM CARBONATE-VITAMIN D 500-200 MG-UNIT PO TABS
1.0000 | ORAL_TABLET | Freq: Two times a day (BID) | ORAL | Status: DC
  Administered 2015-08-18 – 2015-08-22 (×8): 1 via ORAL
  Filled 2015-08-18 (×8): qty 1

## 2015-08-18 MED ORDER — ONDANSETRON HCL 4 MG PO TABS
4.0000 mg | ORAL_TABLET | Freq: Four times a day (QID) | ORAL | Status: DC | PRN
  Administered 2015-08-20: 4 mg via ORAL
  Filled 2015-08-18: qty 1

## 2015-08-18 MED ORDER — MAGIC MOUTHWASH
5.0000 mL | Freq: Three times a day (TID) | ORAL | Status: DC
  Administered 2015-08-18 – 2015-08-22 (×11): 5 mL via ORAL
  Filled 2015-08-18 (×2): qty 10
  Filled 2015-08-18 (×3): qty 5
  Filled 2015-08-18 (×5): qty 10
  Filled 2015-08-18: qty 5

## 2015-08-18 MED ORDER — SODIUM CHLORIDE 0.9% FLUSH
3.0000 mL | Freq: Two times a day (BID) | INTRAVENOUS | Status: DC
  Administered 2015-08-19 – 2015-08-20 (×3): 3 mL via INTRAVENOUS

## 2015-08-18 MED ORDER — MOMETASONE FURO-FORMOTEROL FUM 100-5 MCG/ACT IN AERO
2.0000 | INHALATION_SPRAY | Freq: Two times a day (BID) | RESPIRATORY_TRACT | Status: DC
  Administered 2015-08-18 – 2015-08-22 (×8): 2 via RESPIRATORY_TRACT
  Filled 2015-08-18: qty 8.8

## 2015-08-18 MED ORDER — ALPRAZOLAM 0.25 MG PO TABS
0.2500 mg | ORAL_TABLET | Freq: Every day | ORAL | Status: DC | PRN
  Administered 2015-08-18 – 2015-08-21 (×4): 0.25 mg via ORAL
  Filled 2015-08-18 (×4): qty 1

## 2015-08-18 MED ORDER — SODIUM CHLORIDE 0.9 % IV BOLUS (SEPSIS)
1000.0000 mL | INTRAVENOUS | Status: AC
  Administered 2015-08-18: 1000 mL via INTRAVENOUS

## 2015-08-18 MED ORDER — VANCOMYCIN HCL IN DEXTROSE 1-5 GM/200ML-% IV SOLN
1000.0000 mg | Freq: Once | INTRAVENOUS | Status: AC
  Administered 2015-08-18: 1000 mg via INTRAVENOUS
  Filled 2015-08-18: qty 200

## 2015-08-18 MED ORDER — DEXTROSE 5 % IV SOLN
2.0000 g | Freq: Once | INTRAVENOUS | Status: AC
  Administered 2015-08-18: 2 g via INTRAVENOUS
  Filled 2015-08-18: qty 2

## 2015-08-18 MED ORDER — ONDANSETRON HCL 4 MG/2ML IJ SOLN
4.0000 mg | Freq: Once | INTRAMUSCULAR | Status: AC
Start: 2015-08-18 — End: 2015-08-18
  Administered 2015-08-18: 4 mg via INTRAVENOUS
  Filled 2015-08-18: qty 2

## 2015-08-18 MED ORDER — GUAIFENESIN ER 600 MG PO TB12
600.0000 mg | ORAL_TABLET | Freq: Two times a day (BID) | ORAL | Status: DC
  Administered 2015-08-18 – 2015-08-22 (×8): 600 mg via ORAL
  Filled 2015-08-18 (×8): qty 1

## 2015-08-18 MED ORDER — SODIUM CHLORIDE 0.9 % IV BOLUS (SEPSIS)
500.0000 mL | INTRAVENOUS | Status: AC
  Administered 2015-08-18: 500 mL via INTRAVENOUS

## 2015-08-18 MED ORDER — GUAIFENESIN-DM 100-10 MG/5ML PO SYRP
5.0000 mL | ORAL_SOLUTION | ORAL | Status: DC | PRN
  Administered 2015-08-18 – 2015-08-21 (×5): 5 mL via ORAL
  Filled 2015-08-18 (×5): qty 5

## 2015-08-18 MED ORDER — HEPARIN SODIUM (PORCINE) 5000 UNIT/ML IJ SOLN
5000.0000 [IU] | Freq: Three times a day (TID) | INTRAMUSCULAR | Status: DC
Start: 2015-08-18 — End: 2015-08-19
  Administered 2015-08-18 – 2015-08-19 (×3): 5000 [IU] via SUBCUTANEOUS
  Filled 2015-08-18 (×3): qty 1

## 2015-08-18 MED ORDER — METOPROLOL SUCCINATE ER 50 MG PO TB24
50.0000 mg | ORAL_TABLET | Freq: Every day | ORAL | Status: DC
  Administered 2015-08-18 – 2015-08-22 (×5): 50 mg via ORAL
  Filled 2015-08-18 (×5): qty 1

## 2015-08-18 MED ORDER — RISAQUAD PO CAPS
1.0000 | ORAL_CAPSULE | Freq: Every day | ORAL | Status: DC
  Administered 2015-08-19 – 2015-08-22 (×4): 1 via ORAL
  Filled 2015-08-18 (×4): qty 1

## 2015-08-18 MED ORDER — VITAMIN D 1000 UNITS PO TABS
1000.0000 [IU] | ORAL_TABLET | Freq: Every day | ORAL | Status: DC
  Administered 2015-08-19 – 2015-08-22 (×4): 1000 [IU] via ORAL
  Filled 2015-08-18 (×6): qty 1

## 2015-08-18 MED ORDER — SODIUM CHLORIDE 0.9 % IV SOLN
INTRAVENOUS | Status: DC
  Administered 2015-08-18 – 2015-08-21 (×4): via INTRAVENOUS

## 2015-08-18 MED ORDER — TRAMADOL HCL 50 MG PO TABS: 50.0000 mg | ORAL_TABLET | Freq: Two times a day (BID) | ORAL | Status: DC | PRN

## 2015-08-18 MED ORDER — ONDANSETRON HCL 4 MG/2ML IJ SOLN
4.0000 mg | Freq: Four times a day (QID) | INTRAMUSCULAR | Status: DC | PRN
Start: 2015-08-18 — End: 2015-08-22
  Administered 2015-08-18 – 2015-08-19 (×2): 4 mg via INTRAVENOUS
  Filled 2015-08-18 (×2): qty 2

## 2015-08-18 MED ORDER — ASPIRIN EC 81 MG PO TBEC
81.0000 mg | DELAYED_RELEASE_TABLET | Freq: Every day | ORAL | Status: DC
  Administered 2015-08-18 – 2015-08-22 (×5): 81 mg via ORAL
  Filled 2015-08-18 (×5): qty 1

## 2015-08-18 MED ORDER — ACETAMINOPHEN 650 MG RE SUPP: 650.0000 mg | Freq: Four times a day (QID) | RECTAL | Status: DC | PRN

## 2015-08-18 MED ORDER — PANTOPRAZOLE SODIUM 40 MG PO TBEC
40.0000 mg | DELAYED_RELEASE_TABLET | Freq: Every day | ORAL | Status: DC
  Administered 2015-08-18 – 2015-08-22 (×5): 40 mg via ORAL
  Filled 2015-08-18 (×4): qty 1

## 2015-08-18 MED ORDER — IPRATROPIUM-ALBUTEROL 0.5-2.5 (3) MG/3ML IN SOLN: 3.0000 mL | RESPIRATORY_TRACT | Status: DC | PRN

## 2015-08-18 MED ORDER — SIMVASTATIN 20 MG PO TABS
20.0000 mg | ORAL_TABLET | Freq: Every day | ORAL | Status: DC
  Administered 2015-08-18 – 2015-08-21 (×4): 20 mg via ORAL
  Filled 2015-08-18 (×4): qty 1

## 2015-08-18 MED ORDER — ACETAMINOPHEN 325 MG PO TABS
650.0000 mg | ORAL_TABLET | Freq: Four times a day (QID) | ORAL | Status: DC | PRN
  Administered 2015-08-19 – 2015-08-21 (×3): 650 mg via ORAL
  Filled 2015-08-18 (×3): qty 2

## 2015-08-18 MED ORDER — GABAPENTIN 300 MG PO CAPS
300.0000 mg | ORAL_CAPSULE | Freq: Four times a day (QID) | ORAL | Status: DC
  Administered 2015-08-18 – 2015-08-22 (×14): 300 mg via ORAL
  Filled 2015-08-18 (×15): qty 1

## 2015-08-18 MED ORDER — VANCOMYCIN HCL IN DEXTROSE 1-5 GM/200ML-% IV SOLN: 1000.0000 mg | INTRAVENOUS | Status: DC

## 2015-08-18 MED ORDER — HYDROCOD POLST-CPM POLST ER 10-8 MG/5ML PO SUER: 5.0000 mL | Freq: Two times a day (BID) | ORAL | Status: DC | PRN

## 2015-08-18 MED ORDER — PRAMIPEXOLE DIHYDROCHLORIDE 0.25 MG PO TABS
0.5000 mg | ORAL_TABLET | Freq: Two times a day (BID) | ORAL | Status: DC
  Administered 2015-08-18 – 2015-08-22 (×8): 0.5 mg via ORAL
  Filled 2015-08-18 (×10): qty 2

## 2015-08-18 MED ORDER — LORATADINE 10 MG PO TABS
10.0000 mg | ORAL_TABLET | Freq: Every day | ORAL | Status: DC
  Administered 2015-08-19 – 2015-08-22 (×4): 10 mg via ORAL
  Filled 2015-08-18 (×4): qty 1

## 2015-08-18 MED ORDER — ADULT MULTIVITAMIN W/MINERALS CH
1.0000 | ORAL_TABLET | Freq: Every day | ORAL | Status: DC
  Administered 2015-08-19 – 2015-08-22 (×4): 1 via ORAL
  Filled 2015-08-18 (×4): qty 1

## 2015-08-18 NOTE — ED Notes (Signed)
Critical Lactic 2.5, Dr Archie Balboa notified

## 2015-08-18 NOTE — ED Notes (Signed)
Dr.Goodman made aware of pt's Blood pressure

## 2015-08-18 NOTE — ED Notes (Signed)
Pt had 3 episodes of vomiting with blood in vomit, pt reports having 2 episodes of diarrhea

## 2015-08-18 NOTE — Progress Notes (Signed)
Pharmacy Antibiotic Note  Kaitlyn Huff is a 75 y.o. female admitted on 08/18/2015 with pneumonia and HCAP/sepsis.  Pharmacy has been consulted for Vancomycin, Cefepime, and Levofloxacin dosing.  Plan: Spoke with MD and would like triple antibiotic therapy at this time.  Patient received Vancomycin 1 gm IV Once in ED at 1600.  Will order stacked dose of Vancomycin 1 gm IV Once in 12 hours then start maintenance dose of Vancomycin 1 gm IV q24h.   Est CrCl~41.8 mL/min, ke: 0.039, t1/2: 17.8, Vd: 43.4 L  Will continue patient on Cefepime 2 gm IV q12h and Levofloxacin 750 mg IV q48h based on renal function.  Height: 5\' 4"  (162.6 cm) Weight: 160 lb (72.576 kg) IBW/kg (Calculated) : 54.7  Temp (24hrs), Avg:99.2 F (37.3 C), Min:99.2 F (37.3 C), Max:99.2 F (37.3 C)   Recent Labs Lab 08/18/15 1439 08/18/15 1540  WBC 26.9*  --   CREATININE 1.19*  --   LATICACIDVEN  --  2.5*    Estimated Creatinine Clearance: 41.8 mL/min (by C-G formula based on Cr of 1.19).    Allergies  Allergen Reactions  . Astelin [Azelastine Hcl] Other (See Comments)    Reaction:  Unknown   . Ciprofloxacin Other (See Comments)    Reaction:  GI upset   . Codeine Other (See Comments)    Reaction:  Altered mental status  . Dilaudid [Hydromorphone] Other (See Comments)    Reaction:  Unknown   . Flexeril [Cyclobenzaprine] Other (See Comments)    Reaction:  Unknown   . Imuran [Azathioprine] Other (See Comments)    Reaction:  Abnormal liver function  . Keflex [Cephalexin] Other (See Comments)    Reaction:  GI upset   . Lisinopril Itching  . Lyrica [Pregabalin] Other (See Comments)    Reaction:  Sore gums   . Methotrexate Derivatives Other (See Comments)    Reaction:  Abnormal liver function  . Amoxicillin Rash and Other (See Comments)    Unable to obtain enough information to answer additional questions about this medication.    Jolee Ewing [Leflunomide] Rash  . Clindamycin/Lincomycin Rash  . Doxycycline  Rash  . Lodine [Etodolac] Rash  . Percocet [Oxycodone-Acetaminophen] Rash  . Sulfa Antibiotics Rash    Antimicrobials this admission: Anti-infectives    Start     Dose/Rate Route Frequency Ordered Stop   08/20/15 1800  levofloxacin (LEVAQUIN) IVPB 750 mg     750 mg 100 mL/hr over 90 Minutes Intravenous Every 48 hours 08/18/15 1828     08/20/15 0400  vancomycin (VANCOCIN) IVPB 1000 mg/200 mL premix     1,000 mg 200 mL/hr over 60 Minutes Intravenous Every 24 hours 08/18/15 1828     08/19/15 0400  vancomycin (VANCOCIN) IVPB 1000 mg/200 mL premix     1,000 mg 200 mL/hr over 60 Minutes Intravenous  Once 08/18/15 1828     08/19/15 0100  ceFEPIme (MAXIPIME) 2 g in dextrose 5 % 50 mL IVPB     2 g 100 mL/hr over 30 Minutes Intravenous Every 12 hours 08/18/15 1822     08/18/15 2200  hydroxychloroquine (PLAQUENIL) tablet 200 mg     200 mg Oral 2 times daily 08/18/15 1822     08/18/15 1545  levofloxacin (LEVAQUIN) IVPB 750 mg     750 mg 100 mL/hr over 90 Minutes Intravenous  Once 08/18/15 1531 08/18/15 1720   08/18/15 1545  aztreonam (AZACTAM) 2 g in dextrose 5 % 50 mL IVPB     2 g  100 mL/hr over 30 Minutes Intravenous  Once 08/18/15 1531 08/18/15 1726   08/18/15 1545  vancomycin (VANCOCIN) IVPB 1000 mg/200 mL premix     1,000 mg 200 mL/hr over 60 Minutes Intravenous  Once 08/18/15 1531 08/18/15 1650      Dose adjustments this admission: Continue to follow renal function for adjustments.  Vancomycin trough ordered prior to dose on 2/10 at 0330 (close to steady state).  Microbiology results: Results for orders placed or performed during the hospital encounter of 08/18/15  Rapid Influenza A&B Antigens ([place] only)     Status: None   Collection Time: 08/18/15  3:02 PM  Result Value Ref Range Status   Influenza A ([facility]) NEGATIVE  Final   Influenza B [facility]) NEGATIVE  Final   BCx: pending, C.Diff: pending, UCx: pending  Thank you for allowing pharmacy to be a part of this patient's  care.  Amadi Yoshino G 08/18/2015 6:29 PM

## 2015-08-18 NOTE — H&P (Addendum)
[facility] NAME: Kaitlyn Huff    MR#:  [id number]  DATE OF BIRTH:  June 26, 1941   DATE OF ADMISSION:  08/18/2015  PRIMARY CARE PHYSICIAN: [name], MD   REQUESTING/REFERRING PHYSICIAN: Archie Balboa  CHIEF COMPLAINT:   Nausea vomiting diarrhea  HISTORY OF PRESENT ILLNESS:  Kaitlyn Huff  is a 75 y.o. female with a known history of Rheumatoid arthritis, essential hypertension, GERD without esophagitis is presenting with nausea vomiting diarrhea. She is recently admitted to the hospital for acute respiratory failure secondary to pneumonia she was discharged on antibiotics as well as supplemental oxygen 2 L nasal cannula. She has improved since discharge however now has one day duration "I felt sick" which includes nausea vomiting nonbloody nonbilious emesis, diarrhea, fever, chills, nonproductive cough. Emergency department course: Chest x-ray findings concerning for pneumonia PAST MEDICAL HISTORY:   Past Medical History  Diagnosis Date  . Rheumatoid arthritis(714.0)     MTX transaminitis, Leflunomide (rash), enbrel, plaquinil, prednisone, remicade, Imuran (transaminitis)  . Hyperlipidemia   . Diverticulitis   . Osteoarthritis   . GERD (gastroesophageal reflux disease)   . Hypertension   . Anemia   . Positive PPD     s/p INH (2006)  . Osteoporosis     actonel  . 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 Abdominal hysterectomy    . Tracheostomy  1959  . Tubal ligation    . Cervical cone biopsy    . Ovary surgery    . Cholecystectomy  06/22/14  . Colonoscopy with propofol N/A 07/09/2015    Procedure: COLONOSCOPY WITH PROPOFOL;  Surgeon: Manya Silvas, MD;  Location: [facility] ENDOSCOPY;  Service: Endoscopy;  Laterality: N/A;  . Esophagogastroduodenoscopy (egd) with propofol N/A 07/09/2015    Procedure:  ESOPHAGOGASTRODUODENOSCOPY (EGD) WITH PROPOFOL;  Surgeon: Manya Silvas, MD;  Location: [facility] ENDOSCOPY;  Service: Endoscopy;  Laterality: N/A;    SOCIAL HISTORY:   Social History  Substance Use Topics  . Smoking status: Never Smoker   . Smokeless tobacco: Never Used  . Alcohol Use: No    FAMILY HISTORY:   Family History  Problem Relation Age of Onset  . Heart disease Father     MI  . Heart disease Mother   . Valvular heart disease Mother   . Colon cancer Neg Hx   . Breast cancer Sister 83    DRUG ALLERGIES:   Allergies  Allergen Reactions  . Astelin [Azelastine Hcl] Other (See Comments)    Reaction:  Unknown   . Ciprofloxacin Other (See Comments)    Reaction:  GI upset   . Codeine Other (See Comments)    Reaction:  Altered mental status  . Dilaudid [Hydromorphone] Other (See Comments)    Reaction:  Unknown   . Flexeril [Cyclobenzaprine] Other (See Comments)    Reaction:  Unknown   . Imuran [Azathioprine] Other (See Comments)    Reaction:  Abnormal liver function  . Keflex [Cephalexin] Other (See Comments)    Reaction:  GI upset   . Lisinopril Itching  . Lyrica [Pregabalin] Other (See Comments)    Reaction:  Sore gums   . Methotrexate Derivatives Other (See Comments)    Reaction:  Abnormal liver function  . Amoxicillin Rash and Other (See Comments)    Unable to obtain enough information to answer additional questions about  this medication.    Jolee Ewing [Leflunomide] Rash  . Clindamycin/Lincomycin Rash  . Doxycycline Rash  . Lodine [Etodolac] Rash  . Percocet [Oxycodone-Acetaminophen] Rash  . Sulfa Antibiotics Rash    REVIEW OF SYSTEMS:  REVIEW OF SYSTEMS:  CONSTITUTIONAL: Positive fevers, chills, fatigue, weakness.  EYES: Denies blurred vision, double vision, or eye pain.  EARS, NOSE, THROAT: Denies tinnitus, ear pain, hearing loss.  RESPIRATORY: Positive cough, shortness of breath, denies wheezing  CARDIOVASCULAR: Denies chest pain, palpitations, edema.   GASTROINTESTINAL: Positive nausea, vomiting, diarrhea, denies abdominal pain.  GENITOURINARY: Denies dysuria, hematuria.  ENDOCRINE: Denies nocturia or thyroid problems. HEMATOLOGIC AND LYMPHATIC: Denies easy bruising or bleeding.  SKIN: Denies rash or lesions.  MUSCULOSKELETAL: Denies pain in neck, back, shoulder, knees, hips, or further arthritic symptoms.  NEUROLOGIC: Denies paralysis, paresthesias.  PSYCHIATRIC: Denies anxiety or depressive symptoms. Otherwise full review of systems performed by me is negative.   MEDICATIONS AT HOME:   Prior to Admission medications   Medication Sig Start Date End Date Taking? Authorizing Provider  acidophilus (RISAQUAD) CAPS capsule Take 1 capsule by mouth daily.   Yes Historical Provider, MD  albuterol (PROVENTIL HFA;VENTOLIN HFA) 108 (90 Base) MCG/ACT inhaler Inhale 2 puffs into the lungs every 4 (four) hours as needed for wheezing or shortness of breath. 07/30/15  Yes Leone Haven, MD  albuterol (PROVENTIL) (2.5 MG/3ML) 0.083% nebulizer solution Take 2.5 mg by nebulization every 4 (four) hours as needed for wheezing or shortness of breath.   Yes Historical Provider, MD  ALPRAZolam [name]) 0.25 MG tablet Take 0.25 mg by mouth daily as needed for anxiety.   Yes Historical Provider, MD  aspirin EC 81 MG tablet Take 81 mg by mouth daily.   Yes Historical Provider, MD  Calcium Carbonate-Vitamin D (CALCIUM 600+D) 600-400 MG-UNIT tablet Take 1 tablet by mouth 2 (two) times daily.   Yes Historical Provider, MD  cetirizine (ZYRTEC) 10 MG tablet Take 10 mg by mouth daily.   Yes Historical Provider, MD  chlorpheniramine-HYDROcodone (TUSSIONEX) 10-8 MG/5ML SUER Take 5 mLs by mouth every 12 (twelve) hours as needed for cough. 08/07/15  Yes Theodoro Grist, MD  cholecalciferol (VITAMIN D) 1000 units tablet Take 1,000 Units by mouth daily.   Yes Historical Provider, MD  colestipol (COLESTID) 1 g tablet Take 2 g by mouth 2 (two) times daily.   Yes Historical  Provider, MD  Diphenhyd-Hydrocort-Nystatin (FIRST-DUKES MOUTHWASH MT) Take 5 mLs by mouth 3 (three) times daily. Pt is to swish and spit.   Yes Historical Provider, MD  esomeprazole (NEXIUM) 40 MG capsule Take 40 mg by mouth 2 (two) times daily before a meal.   Yes Historical Provider, MD  FeFum-FePo-FA-B Cmp-C-Zn-Mn-Cu (TANDEM PLUS) 162-115.2-1 MG CAPS Take 1 capsule by mouth daily.   Yes Historical Provider, MD  Fluticasone-Salmeterol (ADVAIR) 250-50 MCG/DOSE AEPB Inhale 1 puff into the lungs 2 (two) times daily.   Yes Historical Provider, MD  furosemide (LASIX) 20 MG tablet Take 20 mg by mouth daily.   Yes Historical Provider, MD  gabapentin (NEURONTIN) 300 MG capsule Take 300 mg by mouth 4 (four) times daily.   Yes Historical Provider, MD  guaiFENesin (MUCINEX) 600 MG 12 hr tablet Take 1 tablet (600 mg total) by mouth 2 (two) times daily. 08/07/15  Yes Theodoro Grist, MD  guaiFENesin-dextromethorphan (ROBITUSSIN DM) 100-10 MG/5ML syrup Take 5 mLs by mouth every 4 (four) hours as needed for cough. 08/07/15  Yes Theodoro Grist, MD  hydroxychloroquine (PLAQUENIL) 200 MG tablet Take  1 tablet (200 mg total) by mouth 2 (two) times daily. 05/13/13  Yes [name], MD  metoprolol succinate (TOPROL-XL) 50 MG 24 hr tablet Take 50 mg by mouth daily. Take with or immediately following a meal.   Yes Historical Provider, MD  Multiple Vitamin (MULTIVITAMIN WITH MINERALS) TABS tablet Take 1 tablet by mouth daily.   Yes Historical Provider, MD  potassium chloride (MICRO-K) 10 MEQ CR capsule Take 20 mEq by mouth 2 (two) times daily.   Yes Historical Provider, MD  pramipexole (MIRAPEX) 0.25 MG tablet Take 0.5 mg by mouth 2 (two) times daily.   Yes Historical Provider, MD  pramipexole (MIRAPEX) 1 MG tablet Take 1 tablet (1 mg total) by mouth at bedtime. 08/07/15  Yes Theodoro Grist, MD  predniSONE (DELTASONE) 10 MG tablet Take 10-60 mg by mouth daily with breakfast. Pt is to take as a taper:    Day 1-2:  Six tablets  daily  Day 3-4:  Five tablets daily  Day 5-6:  Four tablets daily  Day 7-8:  Three tablets daily  Day 9-10:  Two tablets daily  Day 11-12:  One tablet daily 08/07/15 08/19/15 Yes Historical Provider, MD  simvastatin (ZOCOR) 20 MG tablet Take 20 mg by mouth at bedtime.   Yes Historical Provider, MD  sodium chloride (OCEAN) 0.65 % nasal spray Place 1 spray into the nose as needed for congestion.   Yes Historical Provider, MD  tiotropium (SPIRIVA) 18 MCG inhalation capsule Place 1 capsule (18 mcg total) into inhaler and inhale daily. 08/07/15  Yes Theodoro Grist, MD  traMADol (ULTRAM) 50 MG tablet Take 50 mg by mouth 2 (two) times daily as needed for moderate pain.   Yes Historical Provider, MD  pramipexole (MIRAPEX) 0.5 MG tablet Take 1 tablet (0.5 mg total) by mouth 2 (two) times daily as needed (restless legs). Patient not taking: Reported on 08/18/2015 08/07/15   Theodoro Grist, MD      VITAL SIGNS:  Blood pressure 111/53, pulse 79, temperature 99.2 F (37.3 C), temperature source Oral, resp. rate 27, height 5\' 4"  (1.626 m), weight 160 lb (72.576 kg), SpO2 98 %.  PHYSICAL EXAMINATION:  VITAL SIGNS: Filed Vitals:   08/18/15 1600 08/18/15 1630  BP: 88/55 111/53  Pulse: 77 79  Temp:    Resp: 21 45   GENERAL:74 y.o.female currently in no acute distress.  HEAD: Normocephalic, atraumatic.  EYES: Pupils equal, round, reactive to light. Extraocular muscles intact. No scleral icterus.  MOUTH: Moist mucosal membrane. Dentition intact. No abscess noted.  EAR, NOSE, THROAT: Clear without exudates. No external lesions.  NECK: Supple. No thyromegaly. No nodules. No JVD.  PULMONARY: Diffuse right-sided rhonchi with scant left basilar rhonchi without wheeze tachypneic No use of accessory muscles, Good respiratory effort. good air entry bilaterally CHEST: Nontender to palpation.  CARDIOVASCULAR: S1 and S2. Regular rate and rhythm. No murmurs, rubs, or gallops. No edema. Pedal pulses 2+ bilaterally.   GASTROINTESTINAL: Soft, nontender, nondistended. No masses. Positive bowel sounds. No hepatosplenomegaly.  MUSCULOSKELETAL: No swelling, clubbing, or edema. Range of motion full in all extremities.  NEUROLOGIC: Cranial nerves II through XII are intact. No gross focal neurological deficits. Sensation intact. Reflexes intact.  SKIN: No ulceration, lesions, rashes, or cyanosis. Skin warm and dry. Turgor intact.  PSYCHIATRIC: Mood, affect within normal limits. The patient is awake, alert and oriented x 3. Insight, judgment intact.    LABORATORY PANEL:   CBC  Recent Labs Lab 08/18/15 1439  WBC 26.9*  HGB 11.6*  HCT 34.5*  PLT 219   ------------------------------------------------------------------------------------------------------------------  Chemistries   Recent Labs Lab 08/18/15 1439  NA 138  K 3.8  CL 101  CO2 28  GLUCOSE 116*  BUN 19  CREATININE 1.19*  CALCIUM 8.7*  AST 41  ALT 29  ALKPHOS 46  BILITOT 0.8   ------------------------------------------------------------------------------------------------------------------  Cardiac Enzymes  Recent Labs Lab 08/18/15 1439  TROPONINI 0.11*   ------------------------------------------------------------------------------------------------------------------  RADIOLOGY:  Dg Chest 2 View  08/18/2015  CLINICAL DATA:  Weakness with nausea and vomiting EXAM: CHEST  2 VIEW COMPARISON:  Chest CT August 06, 2015; chest radiograph August 03, 2015 FINDINGS: There is extensive airspace consolidation throughout much of the right lung with involvement of portions of the right upper and middle lobes. Left lung is clear. Heart size and pulmonary vascular normal. No adenopathy. No bone lesions. IMPRESSION: Extensive airspace consolidation on the right, consistent with pneumonia. Pneumonia is primarily seen in the right middle and anterior segment right upper lobe regions. Left lung clear. No change in cardiac silhouette.  Electronically Signed   By: Lowella Grip III M.D.   On: 08/18/2015 15:41    EKG:   Orders placed or performed during the hospital encounter of 08/18/15  . EKG 12-Lead  . EKG 12-Lead    IMPRESSION AND PLAN:   75 year old Caucasian female history of rheumatoid arthritis, type 2 diabetes insulin requiring presenting with nausea vomiting diarrhea  1.Sepsis, meeting septic criteria by temperature, leukocytosis, respiratory rate present on arrival. Source likely healthcare associated pneumonia  Panculture. Broad-spectrum antibiotics including vancomycin, Levaquin, aztreonam and taper antibiotics when culture data returns.Continue IV fluid hydration to keep mean arterial pressure greater than 65. He may require pressor therapy if blood pressure worsens. We will repeat lactic acid given the initial is greater than 2.2. Check MRSA PCR, pro-calcitonin 2. Nausea vomiting diarrhea: Given symptoms with septic-like picture will check C. difficile 4. Elevated troponin: Actually somewhat down from last admission place on telemetry trend cardiac enzymes 5. Lipidemia unspecified Zocor 6. Venous thromboembolism prophylactic: Heparin subcutaneous     All the records are reviewed and case discussed with ED provider. Management plans discussed with the patient, family and they are in agreement.  CODE STATUS: Full  TOTAL TIME TAKING CARE OF THIS PATIENT: 40 minutes.    Jihan Rudy,  Karenann Cai.D on 08/18/2015 at 5:05 PM  Between 7am to 6pm - Pager - [phone]  After 6pm: House Pager: - [place]  Office  [phone]  CC: Primary care physician; [name], MD

## 2015-08-18 NOTE — ED Notes (Signed)
Troponin 0.11 , Dr.padowski notified

## 2015-08-18 NOTE — ED Notes (Signed)
Pt reports "not feeling well" today, pt complains of generalized weakness with increased shortness of breath with 3 episodes of vomiting, pt her via EMS

## 2015-08-18 NOTE — ED Provider Notes (Addendum)
[facility] Department Provider Note  Time seen: 2:39 PM  I have reviewed the triage vital signs and the nursing notes.   HISTORY  Chief Complaint No chief complaint on file.    HPI Kaitlyn Huff is a 75 y.o. female with a past medical history of rheumatoid arthritis, hypertension, hyperlipidemia, gastric reflux, presents to the emergency department with cough, trouble breathing, fever, nausea, vomiting, diarrhea. According to the patient beginning this morning she has felt febrile, has been coughing with some mild shortness of breath. Patient wears 2 L home oxygen for her COPD, but feels like her shortness of breath is increased versus baseline. She is also been nauseated with occasional vomiting as had several episodes of diarrhea today. Denies any black or bloody stool or vomit. Denies any chest pain. Does state mild left-sided abdominal pain. Denies any sputum production. Per patient EMS stated fever of 101. Describes her nausea currently as moderate, abdominal pain as mild to moderate, shortness of breath is mild.     Past Medical History  Diagnosis Date  . Rheumatoid arthritis(714.0)     MTX transaminitis, Leflunomide (rash), enbrel, plaquinil, prednisone, remicade, Imuran (transaminitis)  . Hyperlipidemia   . Diverticulitis   . Osteoarthritis   . GERD (gastroesophageal reflux disease)   . Hypertension   . Anemia   . Positive PPD     s/p INH (2006)  . Osteoporosis     actonel  . 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 Restless leg syndrome 08/07/2015  . Acute respiratory failure with hypoxia ([place]) 08/06/2015  . COPD exacerbation ([place]) 08/06/2015  . Bronchitis, chronic obstructive, with exacerbation ([place]) 08/06/2015  . Hyponatremia 08/06/2015  . Pancytopenia ([place]) 08/06/2015  . Ileus ([place]) 08/06/2015  . Elevated troponin 08/03/2015  . Nausea & vomiting 08/03/2015  .  Cough 07/30/2015  . History of colonic polyps 07/10/2015  . Swelling, cheek 01/11/2015  . Health care maintenance 01/11/2015  . Pneumonia 11/09/2014  . Right hip pain 09/02/2014  . Diarrhea 09/02/2014  . Hospital discharge follow-up 09/02/2014  . Itching 08/04/2013  . Stress 08/04/2013  . Weight loss 08/04/2013  . Abnormal liver function test 03/26/2013  . Obstructive sleep apnea 12/23/2012  . Dilated bile duct 12/23/2012  . Hypercholesterolemia 08/12/2012  . Rheumatoid arthritis ([place]) 08/12/2012  . GERD (gastroesophageal reflux disease) 08/12/2012  . Hypertension 08/12/2012  . Anemia 08/12/2012    Past Surgical History  Procedure Laterality Date  . Abdominal hysterectomy    . Tracheostomy  1959  . Tubal ligation    . Cervical cone biopsy    . Ovary surgery    . Cholecystectomy  06/22/14  . Colonoscopy with propofol N/A 07/09/2015    Procedure: COLONOSCOPY WITH PROPOFOL;  Surgeon: Manya Silvas, MD;  Location: [facility] ENDOSCOPY;  Service: Endoscopy;  Laterality: N/A;  . Esophagogastroduodenoscopy (egd) with propofol N/A 07/09/2015    Procedure: ESOPHAGOGASTRODUODENOSCOPY (EGD) WITH PROPOFOL;  Surgeon: Manya Silvas, MD;  Location[facility] END[facility]COPY;  Service: Endoscopy;  Laterality: N/A;    Current Outpatient Rx  Name  Route  Sig  Dispense  Refill  . albuterol (PROVENTIL HFA;VENTOLIN HFA) 108 (90 Base) MCG/ACT inhaler   Inhalation   Inhale 2 puffs into the lungs every 4 (four) hours as needed for wheezing or shortness of breath.   1 Inhaler   2   . albuterol (PROVENTIL) (2.5 MG/3ML) 0.083%  nebulizer solution   Nebulization   Take 3 mLs (2.5 mg total) by nebulization every 4 (four) hours.   75 mL   12   . ALPRAZolam (XANAX) 0.25 MG tablet      1 tablet q day prn   90 tablet   0   . aspirin 81 MG tablet   Oral   Take 81 mg by mouth daily.         . calcium-vitamin D (OSCAL 500/200 D-3) 500-200 MG-UNIT per tablet   Oral   Take 1 tablet by mouth 2 (two)  times daily.         . cetirizine (ZYRTEC) 10 MG tablet      TAKE 1 TABLET DAILY   90 tablet   3   . chlorpheniramine-HYDROcodone (TUSSIONEX) 10-8 MG/5ML SUER   Oral   Take 5 mLs by mouth every 12 (twelve) hours as needed for cough.   140 mL   0   . Diphenhyd-Hydrocort-Nystatin (FIRST-DUKES MOUTHWASH) SUSP      5cc's swish and spit tid   237 mL   0   . FeFum-FePo-FA-B Cmp-C-Zn-Mn-Cu (TANDEM PLUS) 162-115.2-1 MG CAPS      TAKE 1 CAPSULE DAILY   90 each   2   . Fluticasone-Salmeterol (ADVAIR DISKUS) 250-50 MCG/DOSE AEPB      USE 1 INHALATION INTO THE LUNGS EVERY 12 HOURS   180 each   1   . furosemide (LASIX) 20 MG tablet      TAKE 1 TABLET DAILY   90 tablet   1   . gabapentin (NEURONTIN) 300 MG capsule   Oral   Take 300 mg by mouth 4 (four) times daily.         Marland Kitchen guaiFENesin (MUCINEX) 600 MG 12 hr tablet   Oral   Take 1 tablet (600 mg total) by mouth 2 (two) times daily.   30 tablet   3   . guaiFENesin-dextromethorphan (ROBITUSSIN DM) 100-10 MG/5ML syrup   Oral   Take 5 mLs by mouth every 4 (four) hours as needed for cough.   118 mL   0   . hydroxychloroquine (PLAQUENIL) 200 MG tablet   Oral   Take 1 tablet (200 mg total) by mouth 2 (two) times daily.   180 tablet   1   . InFLIXimab (REMICADE IV)      Inject every 6 weeks         . metoprolol succinate (TOPROL-XL) 50 MG 24 hr tablet      TAKE 1 TABLET (50 MG TOTAL) DAILY. TAKE WITH OR IMMEDIATELY FOLLOWING A MEAL   90 tablet   2   . Multiple Vitamin (MULTIVITAMIN) capsule   Oral   Take 1 capsule by mouth daily.         Marland Kitchen NEXIUM 40 MG capsule      TAKE 1 CAPSULE TWICE A DAY BEFORE A MEAL   180 capsule   2   . potassium chloride (MICRO-K) 10 MEQ CR capsule      TAKE 2 CAPSULES TWICE A DAY   360 capsule   3   . pramipexole (MIRAPEX) 0.5 MG tablet   Oral   Take 1 tablet (0.5 mg total) by mouth 2 (two) times daily as needed (restless legs).   60 tablet   6   . pramipexole  (MIRAPEX) 1 MG tablet   Oral   Take 1 tablet (1 mg total) by mouth at bedtime.   30 tablet  6   . predniSONE (STERAPRED UNI-PAK 21 TAB) 10 MG (21) TBPK tablet   Oral   Take 1 tablet (10 mg total) by mouth daily. Please take 6 pills in the morning on the first day then taper by one pill every 2 days until finished. Thank you   42 tablet   0   . simvastatin (ZOCOR) 20 MG tablet      TAKE 1 TABLET DAILY   90 tablet   0   . sodium chloride (OCEAN) 0.65 % nasal spray   Nasal   Place 1 spray into the nose as needed for congestion.         Marland Kitchen tiotropium (SPIRIVA) 18 MCG inhalation capsule   Inhalation   Place 1 capsule (18 mcg total) into inhaler and inhale daily.   30 capsule   12   . traMADol (ULTRAM) 50 MG tablet   Oral   Take 1 tablet (50 mg total) by mouth at bedtime as needed. Patient taking differently: Take 50 mg by mouth 2 (two) times daily.    30 tablet   0     Allergies Astelin; Ciprofloxacin; Codeine; Dilaudid; Flexeril; Imuran; Keflex; Lisinopril; Lyrica; Methotrexate derivatives; Amoxicillin; Arava; Clindamycin/lincomycin; Doxycycline; Lodine; Percocet; and Sulfa antibiotics  Family History  Problem Relation Age of Onset  . Heart disease Father     MI  . Heart disease Mother   . Valvular heart disease Mother   . Colon cancer Neg Hx   . Breast cancer Sister 75    Social History Social History  Substance Use Topics  . Smoking status: Never Smoker   . Smokeless tobacco: Never Used  . Alcohol Use: No    Review of Systems Constitutional: Fever to 101 at home. ENT: Negative for congestion Cardiovascular: Negative for chest pain. Respiratory: Mild shortness of breath. Positive for cough. Negative for sputum. Gastrointestinal: Mild left abdominal pain. Positive for nausea, vomiting, diarrhea. Genitourinary: Negative for dysuria. Musculoskeletal: Negative for back pain. Neurological: Negative for headache 10-point ROS otherwise  negative.  ____________________________________________   PHYSICAL EXAM:  Constitutional: Alert and oriented. Well appearing and in no distress. Eyes: Normal exam ENT   Head: Normocephalic and atraumatic.   Mouth/Throat: Mucous membranes are moist. Cardiovascular: Normal rate, regular rhythm. No murmur Respiratory: Normal respiratory effort without tachypnea nor retractions. Breath sounds are clear  Gastrointestinal: Soft, minimal left sided abdominal tenderness to palpation. No rebound or guarding. No distention. Musculoskeletal: Nontender with normal range of motion in all extremities Neurologic:  Normal speech and language. No gross focal neurologic deficit Skin:  Skin is warm, dry and intact.  Psychiatric: Mood and affect are normal. Speech and behavior are normal.  ____________________________________________    EKG  EKG reviewed and interpreted, such as normal sinus rhythm at 81 bpm, narrow QRS, normal axis, normal intervals, nonspecific but no concerning ST changes.  ____________________________________________    RADIOLOGY  On my read the x-ray appears to show a right sided infiltrate. Patient is being covered with antibiotic currently. Currently awaiting official radiology report. CT Pending  ____________________________________________   INITIAL IMPRESSION / ASSESSMENT AND PLAN / ED COURSE  Pertinent labs & imaging results that were available during my care of the patient were reviewed by me and considered in my medical decision making (see chart for details).  Patient possess the emergency department with nausea, vomiting, diarrhea, fever, shortness of breath and cough. We will check labs, chest x-ray, IV hydrate, treat the patient's nausea, and closely monitor the patient in  the emergency department on a cardiac monitor.  Patient has significant leukocytosis of 26,000. Hypotensive 93/49 which appears to be new for the patient. Given her hypotension,  significant leukocytosis, and reported fever 101 at home, IV initiated sepsis protocols. We will begin IV antibiotics, IV fluids. Given the patient's elevated leukocytosis a moderate left-sided abdominal discomfort we'll proceed with a CT abdomen/pelvis to further evaluate. Patient also complaining of shortness breath and cough, given her elevated leukocytosis I have ordered a chest x-ray to help further evaluate. I have also added on an influenza swab.  Imaging is currently pending as well as influenza swab, patient care signed out to Dr. Archie Balboa.   CRITICAL CARE Performed by: Harvest Dark   Total critical care time: 30 minutes  Critical care time was exclusive of separately billable procedures and treating other patients.  Critical care was necessary to treat or prevent imminent or life-threatening deterioration.  Critical care was time spent personally by me on the following activities: development of treatment plan with patient and/or surrogate as well as nursing, discussions with consultants, evaluation of patient's response to treatment, examination of patient, obtaining history from patient or surrogate, ordering and performing treatments and interventions, ordering and review of laboratory studies, ordering and review of radiographic studies, pulse oximetry and re-evaluation of patient's condition.  ____________________________________________   FINAL CLINICAL IMPRESSION(S) / ED DIAGNOSES  Nausea vomiting diarrhea Fever Dyspnea Sepsis  Harvest Dark, MD 08/18/15 1536  Harvest Dark, MD 08/18/15 1538

## 2015-08-18 NOTE — ED Provider Notes (Signed)
-----------------------------------------   4:27 PM on 08/18/2015 -----------------------------------------  Patient's chest x-ray consistent with pneumonia. At this point patient's abdomen is nontender. She states her abdomen is not hurting her. Given slightly low pressures will hold off on CT scan at this point. I discussed this with patient and with the hospitalists service who will be admitting patient for pneumonia and further management.  Nance Pear, MD 08/18/15 [phone]

## 2015-08-18 NOTE — Progress Notes (Signed)
Patient arrived on floor from ED. Stable at this time. Tele box on and verified with Keon NT. Vs stable. Skin verified with maddie RN. Patient oriented to room and agreed to patient safety contract. Resting quietly in bed with bed alarm on and bed in the lowest position and call light in reach. Will continue to monitor.

## 2015-08-19 ENCOUNTER — Other Ambulatory Visit: Payer: Medicare Other

## 2015-08-19 LAB — TROPONIN I
TROPONIN I: 0.11 ng/mL — AB (ref <0.031)
TROPONIN I: 0.08 ng/mL — AB (ref <0.031)

## 2015-08-19 LAB — CBC
HCT: 28 % — ABNORMAL LOW (ref 35.0–47.0)
HEMOGLOBIN: 9.4 g/dL — AB (ref 12.0–16.0)
MCH: 30.9 pg (ref 26.0–34.0)
MCHC: 33.4 g/dL (ref 32.0–36.0)
MCV: 92.3 fL (ref 80.0–100.0)
PLATELETS: 163 10*3/uL (ref 150–440)
RBC: 3.03 MIL/uL — ABNORMAL LOW (ref 3.80–5.20)
RDW: 13.2 % (ref 11.5–14.5)
WBC: 18.6 10*3/uL — ABNORMAL HIGH (ref 3.6–11.0)

## 2015-08-19 LAB — BASIC METABOLIC PANEL
ANION GAP: 6 (ref 5–15)
BUN: 17 mg/dL (ref 6–20)
CALCIUM: 7.4 mg/dL — AB (ref 8.9–10.3)
CO2: 25 mmol/L (ref 22–32)
CREATININE: 0.89 mg/dL (ref 0.44–1.00)
Chloride: 104 mmol/L (ref 101–111)
GFR calc Af Amer: >60 mL/min (ref >60)
GLUCOSE: 102 mg/dL — AB (ref 65–99)
Potassium: 3.9 mmol/L (ref 3.5–5.1)
Sodium: 135 mmol/L (ref 135–145)

## 2015-08-19 MED ORDER — ENOXAPARIN SODIUM 40 MG/0.4ML ~~LOC~~ SOLN
40.0000 mg | SUBCUTANEOUS | Status: DC
  Administered 2015-08-19 – 2015-08-21 (×3): 40 mg via SUBCUTANEOUS
  Filled 2015-08-19 (×4): qty 0.4

## 2015-08-19 MED ORDER — VANCOMYCIN HCL IN DEXTROSE 1-5 GM/200ML-% IV SOLN
1000.0000 mg | Freq: Two times a day (BID) | INTRAVENOUS | Status: DC
  Administered 2015-08-19 – 2015-08-20 (×3): 1000 mg via INTRAVENOUS
  Filled 2015-08-19 (×4): qty 200

## 2015-08-19 MED ORDER — LEVOFLOXACIN IN D5W 750 MG/150ML IV SOLN
750.0000 mg | INTRAVENOUS | Status: DC
  Administered 2015-08-19: 750 mg via INTRAVENOUS
  Filled 2015-08-19 (×2): qty 150

## 2015-08-19 NOTE — Progress Notes (Signed)
OT Cancellation Note  Patient Details Name: Kaitlyn Huff MRN: [id number] DOB: April 11, 1941   Cancelled Treatment:    Reason Eval/Treat Not Completed: Patient declined, no reason specified. Patient refusing therapy today secondary to dizziness.   Sharon Mt 08/19/2015, 2:33 PM

## 2015-08-19 NOTE — Progress Notes (Signed)
[facility] [place] NAME: Kaitlyn Huff    MR#:  [id number]  DATE OF BIRTH:  Nov 18, 1940  SUBJECTIVE:  CHIEF COMPLAINT:   Chief Complaint  Patient presents with  . Shortness of Breath   Has cough and shortness of breath, Nausea and vomiting but no diarrhea today.  REVIEW OF SYSTEMS:  CONSTITUTIONAL: No fever, fatigue or weakness.  EYES: No blurred or double vision.  EARS, NOSE, AND THROAT: No tinnitus or ear pain.  RESPIRATORY: has cough, shortness of breath, no wheezing or hemoptysis.  CARDIOVASCULAR: No chest pain, orthopnea, edema.  GASTROINTESTINAL: has nausea, vomiting, no diarrhea or abdominal pain.  GENITOURINARY: No dysuria, hematuria.  ENDOCRINE: No polyuria, nocturia,  HEMATOLOGY: No anemia, easy bruising or bleeding SKIN: No rash or lesion. MUSCULOSKELETAL: No joint pain or arthritis.   NEUROLOGIC: No tingling, numbness, weakness.  PSYCHIATRY: No anxiety or depression.   DRUG ALLERGIES:   Allergies  Allergen Reactions  . Astelin [Azelastine Hcl] Other (See Comments)    Reaction:  Unknown   . Ciprofloxacin Other (See Comments)    Reaction:  GI upset   . Codeine Other (See Comments)    Reaction:  Altered mental status  . Dilaudid [Hydromorphone] Other (See Comments)    Reaction:  Unknown   . Flexeril [Cyclobenzaprine] Other (See Comments)    Reaction:  Unknown   . Imuran [Azathioprine] Other (See Comments)    Reaction:  Abnormal liver function  . Keflex [Cephalexin] Other (See Comments)    Reaction:  GI upset   . Lisinopril Itching  . Lyrica [Pregabalin] Other (See Comments)    Reaction:  Sore gums   . Methotrexate Derivatives Other (See Comments)    Reaction:  Abnormal liver function  . Amoxicillin Rash and Other (See Comments)    Unable to obtain enough information to answer additional questions about this medication.    Jolee Ewing [Leflunomide] Rash  . Clindamycin/Lincomycin Rash  . Doxycycline Rash  .  Lodine [Etodolac] Rash  . Percocet [Oxycodone-Acetaminophen] Rash  . Sulfa Antibiotics Rash    VITALS:  Blood pressure 135/50, pulse 63, temperature 98.1 F (36.7 C), temperature source Oral, resp. rate 18, height 5\' 4"  (1.626 m), weight 80.015 kg (176 lb 6.4 oz), SpO2 96 %.  PHYSICAL EXAMINATION:  GENERAL:  75 y.o.-year-old patient lying in the bed with no acute distress. Obese. EYES: Pupils equal, round, reactive to light and accommodation. No scleral icterus. Extraocular muscles intact.  HEENT: Head atraumatic, normocephalic. Oropharynx and nasopharynx clear.  NECK:  Supple, no jugular venous distention. No thyroid enlargement, no tenderness.  LUNGS: Normal breath sounds bilaterally, no wheezing, but has,rhonchi. No use of accessory muscles of respiration.  CARDIOVASCULAR: S1, S2 normal. No murmurs, rubs, or gallops.  ABDOMEN: Soft, nontender, nondistended. Bowel sounds present. No organomegaly or mass.  EXTREMITIES: No pedal edema, cyanosis, or clubbing.  NEUROLOGIC: Cranial nerves II through XII are intact. Muscle strength 4/5 in all extremities. Sensation intact. Gait not checked.  PSYCHIATRIC: The patient is alert and oriented x 3.  SKIN: No obvious rash, lesion, or ulcer.    LABORATORY PANEL:   CBC  Recent Labs Lab 08/19/15 0600  WBC 18.6*  HGB 9.4*  HCT 28.0*  PLT 163   ------------------------------------------------------------------------------------------------------------------  Chemistries   Recent Labs Lab 08/18/15 1439 08/19/15 0600  NA 138 135  K 3.8 3.9  CL 101 104  CO2 28 25  GLUCOSE 116* 102*  BUN 19 17  CREATININE 1.19*  0.89  CALCIUM 8.7* 7.4*  AST 41  --   ALT 29  --   ALKPHOS 46  --   BILITOT 0.8  --    ------------------------------------------------------------------------------------------------------------------  Cardiac Enzymes  Recent Labs Lab 08/19/15 0600  TROPONINI 0.08*    ------------------------------------------------------------------------------------------------------------------  RADIOLOGY:  Dg Chest 2 View  08/18/2015  CLINICAL DATA:  Weakness with nausea and vomiting EXAM: CHEST  2 VIEW COMPARISON:  Chest CT August 06, 2015; chest radiograph August 03, 2015 FINDINGS: There is extensive airspace consolidation throughout much of the right lung with involvement of portions of the right upper and middle lobes. Left lung is clear. Heart size and pulmonary vascular normal. No adenopathy. No bone lesions. IMPRESSION: Extensive airspace consolidation on the right, consistent with pneumonia. Pneumonia is primarily seen in the right middle and anterior segment right upper lobe regions. Left lung clear. No change in cardiac silhouette. Electronically Signed   By: Lowella Grip III M.D.   On: 08/18/2015 15:41    EKG:   Orders placed or performed during the hospital encounter of 08/18/15  . EKG 12-Lead  . EKG 12-Lead    ASSESSMENT AND PLAN:   1.Sepsis with likely healthcare associated pneumonia Continue vancomycin, Levaquin, cefepime and taper antibiotics when culture data returns.Continue IV fluid hydration. Follow-up Panculture and a CBC.   2. Lactic acidosis. Continue IV fluid and follow-up lactic acid.  3. Elevated troponin: Actually somewhat down from last admission place on telemetry trend cardiac enzymes. 4. Lipidemia unspecified, continue Zocor   All the records are reviewed and Huff discussed with Care Management/Social Workerr. Management plans discussed with the patient, daughter and they are in agreement.  CODE STATUS: Full code  TOTAL TIME TAKING CARE OF THIS PATIENT: 39 minutes.  Greater than 50% time was spent on coordination of care and face-to-face counseling.  POSSIBLE D/C IN 3 DAYS, DEPENDING ON CLINICAL CONDITION.   Demetrios Loll M.D on 08/19/2015 at 6:04 PM  Between 7am to 6pm - Pager - [phone]  After 6pm go to  [email or web address] - password [email or web address] [place] [facility]  Office  [phone]  CC: Primary care physician; [name], MD

## 2015-08-19 NOTE — Progress Notes (Signed)
PT Refusal Note  Patient Details Name: Kaitlyn Huff MRN: [id number] DOB: 15-Jan-1941   Cancelled Treatment:    Reason Eval/Treat Not Completed: Patient declined, no reason specified. Chart reviewed and RN consulted. Pt refuses PT evaluation at this time. Pt complains of nausea and states she cannot participate with PT currently. Agrees to work with PT tomorrow. Will attempt PT evaluation on later date.   Lyndel Safe Klaudia Beirne PT, DPT   Sabre Leonetti 08/19/2015, 12:39 PM

## 2015-08-19 NOTE — Progress Notes (Signed)
Did CPT on pt, pt tol well. Explained flutter valve with pt as well, pt demonstrated. Family at bedside.

## 2015-08-19 NOTE — Care Management (Signed)
Readmission assessment completed. Patient was referred to [place] care at last discharge for nursing PT and aide.  Home health agency advised her to proceed to the ED for her sx of nausea vomiting and diarrhea.  Reported temp at home of 101.  CXR concerning for pneumonia and code sepsis initiated.  WBC 26

## 2015-08-19 NOTE — Progress Notes (Signed)
Pharmacy Antibiotic Note  Kaitlyn Huff is a 75 y.o. female admitted on 08/18/2015 with pneumonia and HCAP/sepsis.  Pharmacy has been consulted for Vancomycin, Cefepime, and Levofloxacin dosing.  Plan:  Renal function has improved.  Will change Levofloxacin dosing to 750 mg IV q24h and increase vancomycin to 1g q 12 hours. Will check trough at 1530 on 2/10  Height: 5\' 4"  (162.6 cm) Weight: 176 lb 6.4 oz (80.015 kg) IBW/kg (Calculated) : 54.7  Temp (24hrs), Avg:98.7 F (37.1 C), Min:98.1 F (36.7 C), Max:99.2 F (37.3 C)   Recent Labs Lab 08/18/15 1439 08/18/15 1540 08/18/15 1947 08/19/15 0600  WBC 26.9*  --   --  18.6*  CREATININE 1.19*  --   --  0.89  LATICACIDVEN  --  2.5* 2.3*  --     Estimated Creatinine Clearance: 58.4 mL/min (by C-G formula based on Cr of 0.89).    Allergies  Allergen Reactions  . Astelin [Azelastine Hcl] Other (See Comments)    Reaction:  Unknown   . Ciprofloxacin Other (See Comments)    Reaction:  GI upset   . Codeine Other (See Comments)    Reaction:  Altered mental status  . Dilaudid [Hydromorphone] Other (See Comments)    Reaction:  Unknown   . Flexeril [Cyclobenzaprine] Other (See Comments)    Reaction:  Unknown   . Imuran [Azathioprine] Other (See Comments)    Reaction:  Abnormal liver function  . Keflex [Cephalexin] Other (See Comments)    Reaction:  GI upset   . Lisinopril Itching  . Lyrica [Pregabalin] Other (See Comments)    Reaction:  Sore gums   . Methotrexate Derivatives Other (See Comments)    Reaction:  Abnormal liver function  . Amoxicillin Rash and Other (See Comments)    Unable to obtain enough information to answer additional questions about this medication.    Jolee Ewing [Leflunomide] Rash  . Clindamycin/Lincomycin Rash  . Doxycycline Rash  . Lodine [Etodolac] Rash  . Percocet [Oxycodone-Acetaminophen] Rash  . Sulfa Antibiotics Rash    Antimicrobials this admission: Anti-infectives    Start     Dose/Rate Route  Frequency Ordered Stop   08/20/15 1800  levofloxacin (LEVAQUIN) IVPB 750 mg  Status:  Discontinued     750 mg 100 mL/hr over 90 Minutes Intravenous Every 48 hours 08/18/15 1828 08/19/15 1211   08/20/15 0400  vancomycin (VANCOCIN) IVPB 1000 mg/200 mL premix     1,000 mg 200 mL/hr over 60 Minutes Intravenous Every 24 hours 08/18/15 1828     08/19/15 1800  levofloxacin (LEVAQUIN) IVPB 750 mg     750 mg 100 mL/hr over 90 Minutes Intravenous Every 24 hours 08/19/15 1211     08/19/15 0400  vancomycin (VANCOCIN) IVPB 1000 mg/200 mL premix     1,000 mg 200 mL/hr over 60 Minutes Intravenous  Once 08/18/15 1828 08/19/15 0500   08/19/15 0100  ceFEPIme (MAXIPIME) 2 g in dextrose 5 % 50 mL IVPB     2 g 100 mL/hr over 30 Minutes Intravenous Every 12 hours 08/18/15 1822     08/18/15 2200  hydroxychloroquine (PLAQUENIL) tablet 200 mg     200 mg Oral 2 times daily 08/18/15 1822     08/18/15 1545  levofloxacin (LEVAQUIN) IVPB 750 mg     750 mg 100 mL/hr over 90 Minutes Intravenous  Once 08/18/15 1531 08/18/15 1720   08/18/15 1545  aztreonam (AZACTAM) 2 g in dextrose 5 % 50 mL IVPB     2  g 100 mL/hr over 30 Minutes Intravenous  Once 08/18/15 1531 08/18/15 1726   08/18/15 1545  vancomycin (VANCOCIN) IVPB 1000 mg/200 mL premix     1,000 mg 200 mL/hr over 60 Minutes Intravenous  Once 08/18/15 1531 08/18/15 1650      Dose adjustments this admission: Continue to follow renal function for adjustments.    Microbiology results: Results for orders placed or performed during the hospital encounter of 08/18/15  Rapid Influenza A&B Antigens ([place] only)     Status: None   Collection Time: 08/18/15  3:02 PM  Result Value Ref Range Status   Influenza A ([place]) NEGATIVE  Final   Influenza B ([facility]) NEGATIVE  Final  Urine culture     Status: None (Preliminary result)   Collection Time: 08/18/15  5:38 PM  Result Value Ref Range Status   Specimen Description URINE, CLEAN CATCH  Final   Special Requests NONE   Final   Culture NO GROWTH < 24 HOURS  Final   Report Status PENDING  Incomplete   BCx: pending, C.Diff: pending, UCx: pending  Thank you for allowing pharmacy to be a part of this patient's care.  Ramond Dial 08/19/2015 12:11 PM

## 2015-08-20 LAB — CBC
HCT: 26.3 % — ABNORMAL LOW (ref 35.0–47.0)
HEMOGLOBIN: 9 g/dL — AB (ref 12.0–16.0)
MCH: 31.6 pg (ref 26.0–34.0)
MCHC: 34.2 g/dL (ref 32.0–36.0)
MCV: 92.2 fL (ref 80.0–100.0)
Platelets: 145 10*3/uL — ABNORMAL LOW (ref 150–440)
RBC: 2.85 MIL/uL — ABNORMAL LOW (ref 3.80–5.20)
RDW: 12.9 % (ref 11.5–14.5)
WBC: 10.7 10*3/uL (ref 3.6–11.0)

## 2015-08-20 LAB — MRSA PCR SCREENING: MRSA BY PCR: NEGATIVE

## 2015-08-20 LAB — BASIC METABOLIC PANEL
ANION GAP: 3 — AB (ref 5–15)
BUN: 14 mg/dL (ref 6–20)
CALCIUM: 8.5 mg/dL — AB (ref 8.9–10.3)
CO2: 29 mmol/L (ref 22–32)
Chloride: 102 mmol/L (ref 101–111)
Creatinine, Ser: 0.79 mg/dL (ref 0.44–1.00)
GLUCOSE: 120 mg/dL — AB (ref 65–99)
Potassium: 4.4 mmol/L (ref 3.5–5.1)
SODIUM: 134 mmol/L — AB (ref 135–145)

## 2015-08-20 LAB — URINE CULTURE

## 2015-08-20 LAB — PROCALCITONIN: PROCALCITONIN: 6.42 ng/mL

## 2015-08-20 MED ORDER — SODIUM CHLORIDE 0.9% FLUSH: 3.0000 mL | INTRAVENOUS | Status: DC | PRN

## 2015-08-20 MED ORDER — ENSURE ENLIVE PO LIQD
237.0000 mL | Freq: Two times a day (BID) | ORAL | Status: DC
  Administered 2015-08-20 – 2015-08-22 (×3): 237 mL via ORAL

## 2015-08-20 NOTE — Care Management Important Message (Signed)
Important Message  Patient Details  Name: Kaitlyn Huff MRN: [id number] Date of Birth: 13-Dec-1940   Medicare Important Message Given:  Yes    Beau Fanny, RN 08/20/2015, 9:29 AM

## 2015-08-20 NOTE — Progress Notes (Signed)
OT Cancellation Note  Patient Details Name: Kaitlyn Huff MRN: [id number] DOB: December 14, 1940   Cancelled Treatment:    Reason Eval/Treat Not Completed: Patient declined, no reason specified.  Patient declined Occupational Therapy.  Sharon Mt 08/20/2015, 11:41 AM

## 2015-08-20 NOTE — Progress Notes (Signed)
Pharmacy Antibiotic Note  Kaitlyn Huff is a 75 y.o. female admitted on 08/18/2015 with pneumonia and HCAP/sepsis.  Pharmacy has been consulted for Vancomycin, Cefepime, and Levofloxacin dosing.  Plan: After discussion with Dr. Bridgett Larsson, will discontinue levofloxacin as patient does not need triple coverage. Patient is afebrile WBC have normalized. Awaiting MRSA PCR. Will discontinue vancomycin if negative.  Height: 5\' 4"  (162.6 cm) Weight: 176 lb 6.4 oz (80.015 kg) IBW/kg (Calculated) : 54.7  Temp (24hrs), Avg:98.1 F (36.7 C), Min:98 F (36.7 C), Max:98.1 F (36.7 C)   Recent Labs Lab 08/18/15 1439 08/18/15 1540 08/18/15 1947 08/19/15 0600 08/20/15 0601  WBC 26.9*  --   --  18.6* 10.7  CREATININE 1.19*  --   --  0.89 0.79  LATICACIDVEN  --  2.5* 2.3*  --   --     Estimated Creatinine Clearance: 65 mL/min (by C-G formula based on Cr of 0.79).    Allergies  Allergen Reactions  . Astelin [Azelastine Hcl] Other (See Comments)    Reaction:  Unknown   . Ciprofloxacin Other (See Comments)    Reaction:  GI upset   . Codeine Other (See Comments)    Reaction:  Altered mental status  . Dilaudid [Hydromorphone] Other (See Comments)    Reaction:  Unknown   . Flexeril [Cyclobenzaprine] Other (See Comments)    Reaction:  Unknown   . Imuran [Azathioprine] Other (See Comments)    Reaction:  Abnormal liver function  . Keflex [Cephalexin] Other (See Comments)    Reaction:  GI upset   . Lisinopril Itching  . Lyrica [Pregabalin] Other (See Comments)    Reaction:  Sore gums   . Methotrexate Derivatives Other (See Comments)    Reaction:  Abnormal liver function  . Amoxicillin Rash and Other (See Comments)    Unable to obtain enough information to answer additional questions about this medication.    Jolee Ewing [Leflunomide] Rash  . Clindamycin/Lincomycin Rash  . Doxycycline Rash  . Lodine [Etodolac] Rash  . Percocet [Oxycodone-Acetaminophen] Rash  . Sulfa Antibiotics Rash     Antimicrobials this admission: Anti-infectives    Start     Dose/Rate Route Frequency Ordered Stop   08/20/15 1800  levofloxacin (LEVAQUIN) IVPB 750 mg  Status:  Discontinued     750 mg 100 mL/hr over 90 Minutes Intravenous Every 48 hours 08/18/15 1828 08/19/15 1211   08/20/15 0400  vancomycin (VANCOCIN) IVPB 1000 mg/200 mL premix  Status:  Discontinued     1,000 mg 200 mL/hr over 60 Minutes Intravenous Every 24 hours 08/18/15 1828 08/19/15 1221   08/19/15 1800  levofloxacin (LEVAQUIN) IVPB 750 mg  Status:  Discontinued     750 mg 100 mL/hr over 90 Minutes Intravenous Every 24 hours 08/19/15 1211 08/20/15 1519   08/19/15 1600  vancomycin (VANCOCIN) IVPB 1000 mg/200 mL premix     1,000 mg 200 mL/hr over 60 Minutes Intravenous Every 12 hours 08/19/15 1221     08/19/15 0400  vancomycin (VANCOCIN) IVPB 1000 mg/200 mL premix     1,000 mg 200 mL/hr over 60 Minutes Intravenous  Once 08/18/15 1828 08/19/15 0500   08/19/15 0100  ceFEPIme (MAXIPIME) 2 g in dextrose 5 % 50 mL IVPB     2 g 100 mL/hr over 30 Minutes Intravenous Every 12 hours 08/18/15 1822     08/18/15 2200  hydroxychloroquine (PLAQUENIL) tablet 200 mg     200 mg Oral 2 times daily 08/18/15 1822     08/18/15 1545  levofloxacin (LEVAQUIN) IVPB 750 mg     750 mg 100 mL/hr over 90 Minutes Intravenous  Once 08/18/15 1531 08/18/15 1720   08/18/15 1545  aztreonam (AZACTAM) 2 g in dextrose 5 % 50 mL IVPB     2 g 100 mL/hr over 30 Minutes Intravenous  Once 08/18/15 1531 08/18/15 1726   08/18/15 1545  vancomycin (VANCOCIN) IVPB 1000 mg/200 mL premix     1,000 mg 200 mL/hr over 60 Minutes Intravenous  Once 08/18/15 1531 08/18/15 1650      Dose adjustments this admission: Continue to follow renal function for adjustments.    Microbiology results: Results for orders placed or performed during the hospital encounter of 08/18/15  Rapid Influenza A&B Antigens ([facility] only)     Status: None   Collection Time: 08/18/15  3:02 PM   Result Value Ref Range Status   Influenza A ([place]) NEGATIVE  Final   Influenza B ([facility]) NEGATIVE  Final  Blood Culture (routine x 2)     Status: None (Preliminary result)   Collection Time: 08/18/15  3:39 PM  Result Value Ref Range Status   Specimen Description BLOOD RIGHT ARM  Final   Special Requests BOTTLES DRAWN AEROBIC AND ANAEROBIC  1CC  Final   Culture NO GROWTH < 24 HOURS  Final   Report Status PENDING  Incomplete  Blood Culture (routine x 2)     Status: None (Preliminary result)   Collection Time: 08/18/15  3:50 PM  Result Value Ref Range Status   Specimen Description BLOOD LEFT HAND  Final   Special Requests BOTTLES DRAWN AEROBIC AND ANAEROBIC  1CC  Final   Culture NO GROWTH < 24 HOURS  Final   Report Status PENDING  Incomplete  Urine culture     Status: None   Collection Time: 08/18/15  5:38 PM  Result Value Ref Range Status   Specimen Description URINE, CLEAN CATCH  Final   Special Requests NONE  Final   Culture MULTIPLE SPECIES PRESENT, SUGGEST RECOLLECTION  Final   Report Status 08/20/2015 FINAL  Final    Thank you for allowing pharmacy to be a part of this patient's care.  Ramond Dial 08/20/2015 3:34 PM

## 2015-08-20 NOTE — Progress Notes (Signed)
Initial Nutrition Assessment   INTERVENTION:   Meals and Snacks: Cater to patient preferences; encourage bland and easy to digest foods while N/V Medical Food Supplement Therapy: trial of Ensure Enlive po BID, each supplement provides 350 kcal and 20 grams of protein, to supplement po while poor appetite   NUTRITION DIAGNOSIS:   Inadequate oral intake related to poor appetite, nausea, vomiting, acute illness as evidenced by meal completion < 25%, per patient/family report.  GOAL:   Patient will meet greater than or equal to 90% of their needs  MONITOR:    (Energy Intake, Anthropometrics, Digestive System, Electrolyte/Renal Profile)  REASON FOR ASSESSMENT:   Consult COPD Protocol  ASSESSMENT:    Pt admitted with sepsis due to pneumonia  Past Medical History  Diagnosis Date  . Rheumatoid arthritis(714.0)     MTX transaminitis, Leflunomide (rash), enbrel, plaquinil, prednisone, remicade, Imuran (transaminitis)  . Hyperlipidemia   . Diverticulitis   . Osteoarthritis   . GERD (gastroesophageal reflux disease)   . Hypertension   . Anemia   . Positive PPD     s/p INH (2006)  . Osteoporosis     actonel  . Valvular heart disease     moderate MR and TR     Diet Order:  Diet Heart Room service appropriate?: Yes; Fluid consistency:: Thin   Energy Intake: recorded po intake 0-10% of meals, pt reports eating bites since admission; pt reports decreased appetite, N/V, thrush  Food and Nutrition Related history: pt reports appetite prior to admission had been great, eating reguarly. N/V did not start until admission  Electrolyte and Renal Profile:  Recent Labs Lab 08/18/15 1439 08/19/15 0600 08/20/15 0601  BUN 19 17 14   CREATININE 1.19* 0.89 0.79  NA 138 135 134*  K 3.8 3.9 4.4   Glucose Profile: No results for input(s): GLUCAP in the last 72 hours. Meds: magic mouthwash, MVI, acidophilus, NS at 100 ml/hr  Height:   Ht Readings from Last 1 Encounters:  08/18/15  5\' 4"  (1.626 m)    Weight: pt reports wt has been stable  Wt Readings from Last 1 Encounters:  08/18/15 176 lb 6.4 oz (80.015 kg)    Wt Readings from Last 10 Encounters:  08/18/15 176 lb 6.4 oz (80.015 kg)  08/10/15 169 lb 4 oz (76.771 kg)  08/07/15 169 lb (76.658 kg)  08/03/15 169 lb (76.658 kg)  07/30/15 169 lb 6.4 oz (76.839 kg)  07/09/15 165 lb (74.844 kg)  06/16/15 169 lb (76.658 kg)  06/07/15 165 lb (74.844 kg)  04/14/15 168 lb 8 oz (76.431 kg)  04/01/15 169 lb (76.658 kg)    BMI:  Body mass index is 30.26 kg/(m^2).  LOW Care Level  Kerman Passey MS, [place], LDN [phone] Pager  [phone] Weekend/On-Call Pager

## 2015-08-20 NOTE — Clinical Social Work Note (Signed)
Clinical Social Work Assessment  Patient Details  Name: Kaitlyn Huff MRN: [id number] Date of Birth: 1940/12/30  Date of referral:  08/20/15               Reason for consult:  Other (Comment Required) (COPD Gold )                Permission sought to share information with:  Facility Art therapist granted to share information::     Name::        Agency::     Relationship::   Kaitlyn Huff (Daughter) [phone])  Contact Information:     Housing/Transportation Living arrangements for the past 2 months:  [place] of Information:  Patient, Adult Children Kaitlyn Huff (Daughter) [phone]) Patient Interpreter Needed:  None Criminal Activity/Legal Involvement Pertinent to Current Situation/Hospitalization:  No - Comment as needed Significant Relationships:  Adult Children, Other Family Members Lives with:  Adult Children Do you feel safe going back to the place where you live?  Yes Need for family participation in patient care:  Yes (Comment) Kaitlyn Huff (Daughter) [phone])  Care giving concerns:  COPD Gold Consult    Social Worker assessment / plan:  CSW was consulted to complete a COPD Gold Consult. CSW met with patient and her daughter Kaitlyn Latino at bedside. CSW explained her role. Verbal permission granted to speak to daughter Kaitlyn Latino during assessment. Per patient she lives with her children. She reports that she's "Ready to go home". Patient reports that she's unsure if she has COPD. She stated "I've never smoked a day in my life." She reports that she's been on O2 for some time and looks to discharge back home when "they let me go". She stated that She does not have any discharge needs. Agreed to take the COPD gold questionnaires presented by CSW. Patient Health Questionnaire (PHQ-9) Score: 4= Minimal Depression. Generalized Anxiety Disorder Screener (GAD-7) Score: 5= Probable anxiety disorder. Patient declined the  need for mental heath resources in [place]. She reports "everyone worries... especially when you get old". There are no other current CSW needs at this time. CSW is signing off. CSW will be available if a need were to arise.   Employment status:  Retired Forensic scientist:  Medicare PT Recommendations:  Not assessed at this time Information / Referral to community resources:     Patient/Family's Response to care:  Patient was agreeable to taking the two COPD questionnaires.   Patient/Family's Understanding of and Emotional Response to Diagnosis, Current Treatment, and Prognosis:  Patient and her daughter understand's CSW's role and were appreciative to CSW's assistance.   Emotional Assessment Appearance:  Appears stated age Attitude/Demeanor/Rapport:   (None ) Affect (typically observed):  Calm, Pleasant Orientation:  Oriented to Self, Oriented to Place, Oriented to  Time, Oriented to Situation Alcohol / Substance use:  Not Applicable Psych involvement (Current and /or in the community):  No (Comment)  Discharge Needs  Concerns to be addressed:  Discharge Planning Concerns (COPD Gold) Readmission within the last 30 days:  No Current discharge risk:  None Barriers to Discharge:  No Barriers Identified   [place], LCSW 08/20/2015, 9:24 AM

## 2015-08-20 NOTE — Progress Notes (Signed)
[place] [facility] [place] NAME: Kaitlyn Huff    MR#:  [id number]  DATE OF BIRTH:  Dec 03, 1940  SUBJECTIVE:  CHIEF COMPLAINT:   Chief Complaint  Patient presents with  . Shortness of Breath   Better cough and shortness of breath, has Nausea but no vomiting. Has chronic diarrhea with loose stool. REVIEW OF SYSTEMS:  CONSTITUTIONAL: No fever, fatigue or weakness.  EYES: No blurred or double vision.  EARS, NOSE, AND THROAT: No tinnitus or ear pain.  RESPIRATORY: has cough, shortness of breath, no wheezing or hemoptysis.  CARDIOVASCULAR: No chest pain, orthopnea, edema.  GASTROINTESTINAL: has nausea, no vomiting,  or abdominal pain. Has chronic diarrhea with loose stool. GENITOURINARY: No dysuria, hematuria.  ENDOCRINE: No polyuria, nocturia,  HEMATOLOGY: No anemia, easy bruising or bleeding SKIN: No rash or lesion. MUSCULOSKELETAL: No joint pain or arthritis.   NEUROLOGIC: No tingling, numbness, weakness.  PSYCHIATRY: No anxiety or depression.   DRUG ALLERGIES:   Allergies  Allergen Reactions  . Astelin [Azelastine Hcl] Other (See Comments)    Reaction:  Unknown   . Ciprofloxacin Other (See Comments)    Reaction:  GI upset   . Codeine Other (See Comments)    Reaction:  Altered mental status  . Dilaudid [Hydromorphone] Other (See Comments)    Reaction:  Unknown   . Flexeril [Cyclobenzaprine] Other (See Comments)    Reaction:  Unknown   . Imuran [Azathioprine] Other (See Comments)    Reaction:  Abnormal liver function  . Keflex [Cephalexin] Other (See Comments)    Reaction:  GI upset   . Lisinopril Itching  . Lyrica [Pregabalin] Other (See Comments)    Reaction:  Sore gums   . Methotrexate Derivatives Other (See Comments)    Reaction:  Abnormal liver function  . Amoxicillin Rash and Other (See Comments)    Unable to obtain enough information to answer additional questions about this medication.    Jolee Ewing [Leflunomide] Rash   . Clindamycin/Lincomycin Rash  . Doxycycline Rash  . Lodine [Etodolac] Rash  . Percocet [Oxycodone-Acetaminophen] Rash  . Sulfa Antibiotics Rash    VITALS:  Blood pressure 135/56, pulse 64, temperature 98 F (36.7 C), temperature source Oral, resp. rate 18, height 5\' 4"  (1.626 m), weight 80.015 kg (176 lb 6.4 oz), SpO2 95 %.  PHYSICAL EXAMINATION:  GENERAL:  75 y.o.-year-old patient lying in the bed with no acute distress. Obese. EYES: Pupils equal, round, reactive to light and accommodation. No scleral icterus. Extraocular muscles intact.  HEENT: Head atraumatic, normocephalic. Oropharynx and nasopharynx clear.  NECK:  Supple, no jugular venous distention. No thyroid enlargement, no tenderness.  LUNGS: Normal breath sounds bilaterally, no wheezing, but has,rhonchi. No use of accessory muscles of respiration.  CARDIOVASCULAR: S1, S2 normal. No murmurs, rubs, or gallops.  ABDOMEN: Soft, nontender, nondistended. Bowel sounds present. No organomegaly or mass.  EXTREMITIES: No pedal edema, cyanosis, or clubbing.  NEUROLOGIC: Cranial nerves II through XII are intact. Muscle strength 4/5 in all extremities. Sensation intact. Gait not checked.  PSYCHIATRIC: The patient is alert and oriented x 3.  SKIN: No obvious rash, lesion, or ulcer.    LABORATORY PANEL:   CBC  Recent Labs Lab 08/20/15 0601  WBC 10.7  HGB 9.0*  HCT 26.3*  PLT 145*   ------------------------------------------------------------------------------------------------------------------  Chemistries   Recent Labs Lab 08/18/15 1439  08/20/15 0601  NA 138  < > 134*  K 3.8  < > 4.4  CL 101  < >  102  CO2 28  < > 29  GLUCOSE 116*  < > 120*  BUN 19  < > 14  CREATININE 1.19*  < > 0.79  CALCIUM 8.7*  < > 8.5*  AST 41  --   --   ALT 29  --   --   ALKPHOS 46  --   --   BILITOT 0.8  --   --   < > = values in this interval not  displayed. ------------------------------------------------------------------------------------------------------------------  Cardiac Enzymes  Recent Labs Lab 08/19/15 0600  TROPONINI 0.08*   ------------------------------------------------------------------------------------------------------------------  RADIOLOGY:  No results found.  EKG:   Orders placed or performed during the hospital encounter of 08/18/15  . EKG 12-Lead  . EKG 12-Lead    ASSESSMENT AND PLAN:   1.Sepsis with likely healthcare associated pneumonia Continue cefepime, discontinue vancomycin and Levaquin. Continue IV fluid hydration. Follow-up cultures. Leukocytosis improved.  2. Lactic acidosis. Continue IV fluid and follow-up lactic acid.  3. Elevated troponin:  Trended down. Possible due to demanding ischemia due to sepsis and pneumonia.  4. Lipidemia unspecified, continue Zocor  Per PT evaluation, the patient needs home health and PT All the records are reviewed and case discussed with Care Management/Social Workerr. Management plans discussed with the patient, daughter and they are in agreement.  CODE STATUS: Full code  TOTAL TIME TAKING CARE OF THIS PATIENT: 38 minutes.  Greater than 50% time was spent on coordination of care and face-to-face counseling.  POSSIBLE D/C IN 2-3 DAYS, DEPENDING ON CLINICAL CONDITION.   Demetrios Loll M.D on 08/20/2015 at 5:41 PM  Between 7am to 6pm - Pager - [phone]  After 6pm go to [email or web address] - password [email or web address] [facility]  Office  [phone]  CC: Primary care physician; [name], MD

## 2015-08-20 NOTE — Evaluation (Signed)
Physical Therapy Evaluation Patient Details Name: CARREN ALTHEIDE MRN: [id number] DOB: [birth year] Today's Date: 08/20/2015   History of Present Illness  Alexsus Wehrle is a 75 y.o. female with a known history of Rheumatoid arthritis, essential hypertension, GERD without esophagitis is presenting with nausea vomiting diarrhea. She is recently admitted to the hospital for acute respiratory failure secondary to pneumonia she was discharged on antibiotics as well as supplemental oxygen 2 L nasal cannula. She has improved since discharge however now has one day duration "I felt sick" which includes nausea vomiting nonbloody nonbilious emesis, diarrhea, fever, chills, nonproductive cough. Emergency department course: Chest x-ray findings concerning for pneumonia.  Clinical Impression  Pt is known to therapist from prior admission. She is complaining of nausea today which limits her desire for extensive therapy but easily agrees to limited ambulation and transfers. Pt demonstrates general deconditioning from prolonged bedrest as well as minimal DOE. She is stable with use of rolling walker for limited ambulation in room. Pt requires increased time to come to standing due to LE weakness however is steady with bilateral UE support. Pt will be appropriate to return home with [facility] PT and family support when medically appropriate. Pt will benefit from skilled PT services to address deficits in strength, balance, and mobility in order to return to full function at home.      Follow Up Recommendations Home health PT;Supervision - Intermittent    Equipment Recommendations  Rolling walker with 5" wheels    Recommendations for Other Services       Precautions / Restrictions Precautions Precautions: Fall Restrictions Weight Bearing Restrictions: No      Mobility  Bed Mobility Overal bed mobility: Needs Assistance Bed Mobility: Supine to Sit     Supine to sit: Supervision     General bed mobility  comments: Pt requires extended time and positioning with HOB elevated in order to come to sitting. Initially unsteady in sitting requireing bilateral UE support and feet supported however improves with extended time  Transfers Overall transfer level: Needs assistance Equipment used: Rolling walker (2 wheeled) Transfers: Sit to/from Stand Sit to Stand: Min guard         General transfer comment: Pt able to come to standing without assist however takes increased time to perform. Once upright pt is steady in standing  Ambulation/Gait Ambulation/Gait assistance: Min guard Ambulation Distance (Feet): 30 Feet Assistive device: Rolling walker (2 wheeled) Gait Pattern/deviations: Step-through pattern Gait velocity: Decreased Gait velocity interpretation: <1.8 ft/sec, indicative of risk for recurrent falls General Gait Details: Decreased step length and gait speed but good stability with rolling walker. SaO2>90% on supplemental O2 and pt denies DOE. Pt has been having nausea this AM and elects not to walk further  Stairs            Wheelchair Mobility    Modified Rankin (Stroke Patients Only)       Balance Overall balance assessment: Needs assistance Sitting-balance support: Bilateral upper extremity supported;Feet supported Sitting balance-Leahy Scale: Fair Sitting balance - Comments: Initially unsteady in sitting but improves with extended duration   Standing balance support: No upper extremity supported Standing balance-Leahy Scale: Fair Standing balance comment: Positive Rhomberg for +2 sway but no LOB. Did not pursue further balance testing as pt feeling unwell.                             Pertinent Vitals/Pain Pain Assessment: No/denies pain    Home Living  Family/patient expects to be discharged to:: Private residence Living Arrangements: Children Available Help at Discharge: Family;Available 24 hours/day Type of Home: House Home Access: Stairs to  enter Entrance Stairs-Rails: Can reach both Entrance Stairs-Number of Steps: 5 Home Layout: Laundry or work area in basement;Able to live on main level with bedroom/bathroom Home Equipment: Cane - quad (no walker, no grab bars, no BSC)      Prior Function Level of Independence: Independent         Comments: Independent with ADLs. Assist from daughter with IADLs     Hand Dominance   Dominant Hand: Right    Extremity/Trunk Assessment   Upper Extremity Assessment: Overall WFL for tasks assessed           Lower Extremity Assessment: Overall WFL for tasks assessed (General deconditioning evident)         Communication   Communication: No difficulties  Cognition Arousal/Alertness: Awake/alert Behavior During Therapy: WFL for tasks assessed/performed Overall Cognitive Status: Within Functional Limits for tasks assessed                      General Comments      Exercises        Assessment/Plan    PT Assessment Patient needs continued PT services  PT Diagnosis Difficulty walking;Abnormality of gait;Generalized weakness   PT Problem List Decreased strength;Decreased activity tolerance;Decreased balance;Decreased mobility;Decreased knowledge of use of DME;Cardiopulmonary status limiting activity  PT Treatment Interventions DME instruction;Gait training;Stair training;Therapeutic activities;Therapeutic exercise;Balance training;Neuromuscular re-education;Patient/family education   PT Goals (Current goals can be found in the Care Plan section) Acute Rehab PT Goals Patient Stated Goal: Improve function at home PT Goal Formulation: With patient/family Time For Goal Achievement: 09/03/15 Potential to Achieve Goals: Good    Frequency Min 2X/week   Barriers to discharge Decreased caregiver support Pt mostly has 24/7 support from daughter however she occasionally travels to the beach    Co-evaluation               End of Session Equipment Utilized  During Treatment: Gait belt;Oxygen Activity Tolerance: Patient limited by fatigue;Other (comment) (Limited by nausea) Patient left: in bed;with bed alarm set;with family/visitor present (Refuses up to chair currently) Nurse Communication: Mobility status         Time: 1354-1415 PT Time Calculation (min) (ACUTE ONLY): 21 min   Charges:   PT Evaluation $PT Eval Moderate Complexity: 1 Procedure     PT G Codes:       Lyndel Safe Massimo Hartland PT, DPT   Eudell Julian 08/20/2015, 2:34 PM

## 2015-08-21 LAB — LACTIC ACID, PLASMA: LACTIC ACID, VENOUS: 0.9 mmol/L (ref 0.5–2.0)

## 2015-08-21 MED ORDER — CEFPODOXIME PROXETIL 200 MG PO TABS
200.0000 mg | ORAL_TABLET | Freq: Two times a day (BID) | ORAL | Status: DC
  Administered 2015-08-21 – 2015-08-22 (×2): 200 mg via ORAL
  Filled 2015-08-21 (×4): qty 1

## 2015-08-21 NOTE — Care Management (Signed)
Per attending, anticipate discharge back home and resume home halth SN PT and Aide with [facility].  Notified [facility]

## 2015-08-21 NOTE — Progress Notes (Signed)
Pharmacy Antibiotic Note  Kaitlyn Huff is a 75 y.o. female admitted on 08/18/2015 with pneumonia and HCAP/sepsis.  Pharmacy has been consulted for Vancomycin, Cefepime, and Levofloxacin dosing.  Plan: Levofloxacin and Vancomycin have been discontinued. Patient is currently on cefepime. Plan is to d/c home possibly tomorrow. Pt with multiple abx allergies. Amoxicillin -rash, keflex -nausea, doxy-rash, cipro-gi upset. Pt was recently on levofloxacin which puts her at risk for MDR s.pnumo.  Recommeneded d/c on cefpodoxamine. Dr. Bridgett Larsson would like to start today prior to d/c to make sure pt doesn't have reaction. cefpodoxamine 200mg  q 12 hours.  Height: 5\' 4"  (162.6 cm) Weight: 176 lb 6.4 oz (80.015 kg) IBW/kg (Calculated) : 54.7  Temp (24hrs), Avg:98.4 F (36.9 C), Min:98.2 F (36.8 C), Max:98.9 F (37.2 C)   Recent Labs Lab 08/18/15 1439 08/18/15 1540 08/18/15 1947 08/19/15 0600 08/20/15 0601 08/21/15 0507  WBC 26.9*  --   --  18.6* 10.7  --   CREATININE 1.19*  --   --  0.89 0.79  --   LATICACIDVEN  --  2.5* 2.3*  --   --  0.9    Estimated Creatinine Clearance: 65 mL/min (by C-G formula based on Cr of 0.79).    Allergies  Allergen Reactions  . Astelin [Azelastine Hcl] Other (See Comments)    Reaction:  Unknown   . Ciprofloxacin Other (See Comments)    Reaction:  GI upset   . Codeine Other (See Comments)    Reaction:  Altered mental status  . Dilaudid [Hydromorphone] Other (See Comments)    Reaction:  Unknown   . Flexeril [Cyclobenzaprine] Other (See Comments)    Reaction:  Unknown   . Imuran [Azathioprine] Other (See Comments)    Reaction:  Abnormal liver function  . Keflex [Cephalexin] Other (See Comments)    Reaction:  GI upset   . Lisinopril Itching  . Lyrica [Pregabalin] Other (See Comments)    Reaction:  Sore gums   . Methotrexate Derivatives Other (See Comments)    Reaction:  Abnormal liver function  . Amoxicillin Rash and Other (See Comments)    Unable to  obtain enough information to answer additional questions about this medication.    Jolee Ewing [Leflunomide] Rash  . Clindamycin/Lincomycin Rash  . Doxycycline Rash  . Lodine [Etodolac] Rash  . Percocet [Oxycodone-Acetaminophen] Rash  . Sulfa Antibiotics Rash    Antimicrobials this admission: Anti-infectives    Start     Dose/Rate Route Frequency Ordered Stop   08/21/15 2200  cefpodoxime (VANTIN) tablet 200 mg     200 mg Oral Every 12 hours 08/21/15 1519     08/20/15 1800  levofloxacin (LEVAQUIN) IVPB 750 mg  Status:  Discontinued     750 mg 100 mL/hr over 90 Minutes Intravenous Every 48 hours 08/18/15 1828 08/19/15 1211   08/20/15 0400  vancomycin (VANCOCIN) IVPB 1000 mg/200 mL premix  Status:  Discontinued     1,000 mg 200 mL/hr over 60 Minutes Intravenous Every 24 hours 08/18/15 1828 08/19/15 1221   08/19/15 1800  levofloxacin (LEVAQUIN) IVPB 750 mg  Status:  Discontinued     750 mg 100 mL/hr over 90 Minutes Intravenous Every 24 hours 08/19/15 1211 08/20/15 1519   08/19/15 1600  vancomycin (VANCOCIN) IVPB 1000 mg/200 mL premix  Status:  Discontinued     1,000 mg 200 mL/hr over 60 Minutes Intravenous Every 12 hours 08/19/15 1221 08/20/15 1750   08/19/15 0400  vancomycin (VANCOCIN) IVPB 1000 mg/200 mL premix  1,000 mg 200 mL/hr over 60 Minutes Intravenous  Once 08/18/15 1828 08/19/15 0500   08/19/15 0100  ceFEPIme (MAXIPIME) 2 g in dextrose 5 % 50 mL IVPB  Status:  Discontinued     2 g 100 mL/hr over 30 Minutes Intravenous Every 12 hours 08/18/15 1822 08/21/15 1519   08/18/15 2200  hydroxychloroquine (PLAQUENIL) tablet 200 mg     200 mg Oral 2 times daily 08/18/15 1822     08/18/15 1545  levofloxacin (LEVAQUIN) IVPB 750 mg     750 mg 100 mL/hr over 90 Minutes Intravenous  Once 08/18/15 1531 08/18/15 1720   08/18/15 1545  aztreonam (AZACTAM) 2 g in dextrose 5 % 50 mL IVPB     2 g 100 mL/hr over 30 Minutes Intravenous  Once 08/18/15 1531 08/18/15 1726   08/18/15 1545  vancomycin  (VANCOCIN) IVPB 1000 mg/200 mL premix     1,000 mg 200 mL/hr over 60 Minutes Intravenous  Once 08/18/15 1531 08/18/15 1650      Dose adjustments this admission: Continue to follow renal function for adjustments.    Microbiology results: Results for orders placed or performed during the hospital encounter of 08/18/15  Rapid Influenza A&B Antigens ([facility] only)     Status: None   Collection Time: 08/18/15  3:02 PM  Result Value Ref Range Status   Influenza A ([place]) NEGATIVE  Final   Influenza B ([facility]) NEGATIVE  Final  Blood Culture (routine x 2)     Status: None (Preliminary result)   Collection Time: 08/18/15  3:39 PM  Result Value Ref Range Status   Specimen Description BLOOD RIGHT ARM  Final   Special Requests BOTTLES DRAWN AEROBIC AND ANAEROBIC  1CC  Final   Culture NO GROWTH 3 DAYS  Final   Report Status PENDING  Incomplete  Blood Culture (routine x 2)     Status: None (Preliminary result)   Collection Time: 08/18/15  3:50 PM  Result Value Ref Range Status   Specimen Description BLOOD LEFT HAND  Final   Special Requests BOTTLES DRAWN AEROBIC AND ANAEROBIC  1CC  Final   Culture NO GROWTH 3 DAYS  Final   Report Status PENDING  Incomplete  Urine culture     Status: None   Collection Time: 08/18/15  5:38 PM  Result Value Ref Range Status   Specimen Description URINE, CLEAN CATCH  Final   Special Requests NONE  Final   Culture MULTIPLE SPECIES PRESENT, SUGGEST RECOLLECTION  Final   Report Status 08/20/2015 FINAL  Final  MRSA PCR Screening     Status: None   Collection Time: 08/19/15  3:31 PM  Result Value Ref Range Status   MRSA by PCR NEGATIVE NEGATIVE Final    Comment:        The GeneXpert MRSA Assay (FDA approved for NASAL specimens only), is one component of a comprehensive MRSA colonization surveillance program. It is not intended to diagnose MRSA infection nor to guide or monitor treatment for MRSA infections.     Thank you for allowing pharmacy to be a part of  this patient's care.  Ramond Dial 08/21/2015 3:19 PM

## 2015-08-21 NOTE — Care Management (Signed)
Anticipate discharge over the weekend with resumption of home health SN PT and A[facility] through [facility]

## 2015-08-21 NOTE — Progress Notes (Signed)
[place] [facility] [place] NAME: Kaitlyn Huff    MR#:  [id number]  DATE OF BIRTH:  [birth year]  SUBJECTIVE:  CHIEF COMPLAINT:   Chief Complaint  Patient presents with  . Shortness of Breath   Feels much better, mild cough and shortness of breath, no Nausea but no vomiting. On oxygen by nasal cannular 2 L. REVIEW OF SYSTEMS:  CONSTITUTIONAL: No fever, fatigue or weakness.  EYES: No blurred or double vision.  EARS, NOSE, AND THROAT: No tinnitus or ear pain.  RESPIRATORY: has cough, shortness of breath, no wheezing or hemoptysis.  CARDIOVASCULAR: No chest pain, orthopnea, edema.  GASTROINTESTINAL: no nausea, no vomiting,  or abdominal pain. Has chronic diarrhea with loose stool. GENITOURINARY: No dysuria, hematuria.  ENDOCRINE: No polyuria, nocturia,  HEMATOLOGY: No anemia, easy bruising or bleeding SKIN: No rash or lesion. MUSCULOSKELETAL: No joint pain or arthritis.   NEUROLOGIC: No tingling, numbness, weakness.  PSYCHIATRY: No anxiety or depression.   DRUG ALLERGIES:   Allergies  Allergen Reactions  . Astelin [Azelastine Hcl] Other (See Comments)    Reaction:  Unknown   . Ciprofloxacin Other (See Comments)    Reaction:  GI upset   . Codeine Other (See Comments)    Reaction:  Altered mental status  . Dilaudid [Hydromorphone] Other (See Comments)    Reaction:  Unknown   . Flexeril [Cyclobenzaprine] Other (See Comments)    Reaction:  Unknown   . Imuran [Azathioprine] Other (See Comments)    Reaction:  Abnormal liver function  . Keflex [Cephalexin] Other (See Comments)    Reaction:  GI upset   . Lisinopril Itching  . Lyrica [Pregabalin] Other (See Comments)    Reaction:  Sore gums   . Methotrexate Derivatives Other (See Comments)    Reaction:  Abnormal liver function  . Amoxicillin Rash and Other (See Comments)    Unable to obtain enough information to answer additional questions about this medication.    Jolee Ewing  [Leflunomide] Rash  . Clindamycin/Lincomycin Rash  . Doxycycline Rash  . Lodine [Etodolac] Rash  . Percocet [Oxycodone-Acetaminophen] Rash  . Sulfa Antibiotics Rash    VITALS:  Blood pressure 143/69, pulse 81, temperature 98.2 F (36.8 C), temperature source Oral, resp. rate 18, height 5\' 4"  (1.626 m), weight 80.015 kg (176 lb 6.4 oz), SpO2 95 %.  PHYSICAL EXAMINATION:  GENERAL:  75 y.o.-year-old patient lying in the bed with no acute distress. Obese. EYES: Pupils equal, round, reactive to light and accommodation. No scleral icterus. Extraocular muscles intact.  HEENT: Head atraumatic, normocephalic. Oropharynx and nasopharynx clear.  NECK:  Supple, no jugular venous distention. No thyroid enlargement, no tenderness.  LUNGS: Normal breath sounds bilaterally, no wheezing, but has,rhonchi. No use of accessory muscles of respiration.  CARDIOVASCULAR: S1, S2 normal. No murmurs, rubs, or gallops.  ABDOMEN: Soft, nontender, nondistended. Bowel sounds present. No organomegaly or mass.  EXTREMITIES: No pedal edema, cyanosis, or clubbing.  NEUROLOGIC: Cranial nerves II through XII are intact. Muscle strength 4/5 in all extremities. Sensation intact. Gait not checked.  PSYCHIATRIC: The patient is alert and oriented x 3.  SKIN: No obvious rash, lesion, or ulcer.    LABORATORY PANEL:   CBC  Recent Labs Lab 08/20/15 0601  WBC 10.7  HGB 9.0*  HCT 26.3*  PLT 145*   ------------------------------------------------------------------------------------------------------------------  Chemistries   Recent Labs Lab 08/18/15 1439  08/20/15 0601  NA 138  < > 134*  K 3.8  < > 4.4  CL 101  < > 102  CO2 28  < > 29  GLUCOSE 116*  < > 120*  BUN 19  < > 14  CREATININE 1.19*  < > 0.79  CALCIUM 8.7*  < > 8.5*  AST 41  --   --   ALT 29  --   --   ALKPHOS 46  --   --   BILITOT 0.8  --   --   < > = values in this interval not  displayed. ------------------------------------------------------------------------------------------------------------------  Cardiac Enzymes  Recent Labs Lab 08/19/15 0600  TROPONINI 0.08*   ------------------------------------------------------------------------------------------------------------------  RADIOLOGY:  No results found.  EKG:   Orders placed or performed during the hospital encounter of 08/18/15  . EKG 12-Lead  . EKG 12-Lead    ASSESSMENT AND PLAN:   1.Sepsis with likely healthcare associated pneumonia Discontinue cefepime,  Start vantin per pharmacist. She was treated with vancomycin and Levaquin. Disontinue IV fluid hydration. Negative cultures so far. Leukocytosis improved.  2. Lactic acidosis. Improved with IV fluid.  3. Elevated troponin:  Trended down. Possible due to demanding ischemia due to sepsis and pneumonia.  4. Lipidemia unspecified, continue Zocor  Per PT evaluation, the patient needs home health and PT All the records are reviewed and case discussed with Care Management/Social Workerr. Management plans discussed with the patient, daughter and they are in agreement.  CODE STATUS: Full code  TOTAL TIME TAKING CARE OF THIS PATIENT: 35 minutes.  Greater than 50% time was spent on coordination of care and face-to-face counseling.  POSSIBLE D/C IN 1 DAYS, DEPENDING ON CLINICAL CONDITION.   Demetrios Loll M.D on 08/21/2015 at 4:11 PM  Between 7am to 6pm - Pager - [phone]  After 6pm go to [email or web address] - password [email or web address] [facility]  Office  [phone]  CC: Primary care physician; [name], MD

## 2015-08-22 LAB — PROCALCITONIN: Procalcitonin: 1.87 ng/mL

## 2015-08-22 MED ORDER — CEFPODOXIME PROXETIL 200 MG PO TABS: 200.0000 mg | ORAL_TABLET | Freq: Two times a day (BID) | ORAL | Status: DC

## 2015-08-22 NOTE — Discharge Summary (Signed)
[facility] [place] NAME: Kaitlyn Huff    MR#:  [id number]  DATE OF BIRTH:  06-Jan-1941  DATE OF ADMISSION:  08/18/2015 ADMITTING PHYSICIAN: Lytle Butte, MD  DATE OF DISCHARGE: 08/22/2015  1:15 PM  PRIMARY CARE PHYSICIAN: [name], MD    ADMISSION DIAGNOSIS:  Healthcare-associated pneumonia [J18.9]   DISCHARGE DIAGNOSIS:  Sepsis with healthcare associated pneumonia Lactic acidosis Elevated troponin SECONDARY DIAGNOSIS:   Past Medical History  Diagnosis Date  . Rheumatoid arthritis(714.0)     MTX transaminitis, Leflunomide (rash), enbrel, plaquinil, prednisone, remicade, Imuran (transaminitis)  . Hyperlipidemia   . Diverticulitis   . Osteoarthritis   . GERD (gastroesophageal reflux disease)   . Hypertension   . Anemia   . Positive PPD     s/p INH (2006)  . Osteoporosis     actonel  . Valvular heart disease     moderate MR and TR    HOSPITAL COURSE:  1.Sepsis with healthcare associated pneumonia she was treated withefepime IV and changed to vantin yesterday per pharmacist. She was initially treated with vancomycin and Levaquin. Negative cultures so far. Leukocytosis improved.  she is on home oxygen 2 L [place].  Her symptoms has much improved.  2. Lactic acidosis. Improved with IV fluid.  3. Elevated troponin: Trended down. Possible due to demanding ischemia due to sepsis and pneumonia.  4. Lipidemia unspecified, continue Zocor.  Per PT evaluation, the patient need home health and PT.  DISCHARGE CONDITIONS:   Stable, discharged to home with home health and PT today.   CONSULTS OBTAINED:  Treatment Team:  Lytle Butte, MD  DRUG ALLERGIES:   Allergies  Allergen Reactions  . Astelin [Azelastine Hcl] Other (See Comments)    Reaction:  Unknown   . Ciprofloxacin Other (See Comments)    Reaction:  GI upset   . Codeine Other (See Comments)    Reaction:  Altered mental status  . Dilaudid  [Hydromorphone] Other (See Comments)    Reaction:  Unknown   . Flexeril [Cyclobenzaprine] Other (See Comments)    Reaction:  Unknown   . Imuran [Azathioprine] Other (See Comments)    Reaction:  Abnormal liver function  . Keflex [Cephalexin] Other (See Comments)    Reaction:  GI upset   . Lisinopril Itching  . Lyrica [Pregabalin] Other (See Comments)    Reaction:  Sore gums   . Methotrexate Derivatives Other (See Comments)    Reaction:  Abnormal liver function  . Amoxicillin Rash and Other (See Comments)    Unable to obtain enough information to answer additional questions about this medication.    Jolee Ewing [Leflunomide] Rash  . Clindamycin/Lincomycin Rash  . Doxycycline Rash  . Lodine [Etodolac] Rash  . Percocet [Oxycodone-Acetaminophen] Rash  . Sulfa Antibiotics Rash    DISCHARGE MEDICATIONS:   Discharge Medication List as of 08/22/2015 12:50 PM    START taking these medications   Details  cefpodoxime (VANTIN) 200 MG tablet Take 1 tablet (200 mg total) by mouth 2 (two) times daily., Starting 08/22/2015, Until Discontinued, Print      CONTINUE these medications which have NOT CHANGED   Details  acidophilus (RISAQUAD) CAPS capsule Take 1 capsule by mouth daily., Until Discontinued, Historical Med    albuterol (PROVENTIL HFA;VENTOLIN HFA) 108 (90 Base) MCG/ACT inhaler Inhale 2 puffs into the lungs every 4 (four) hours as needed for wheezing or shortness of breath., Starting 07/30/2015, Until Discontinued, Normal    albuterol (PROVENTIL) (  2.5 MG/3ML) 0.083% nebulizer solution Take 2.5 mg by nebulization every 4 (four) hours as needed for wheezing or shortness of breath., Until Discontinued, Historical Med    ALPRAZolam (XANAX) 0.25 MG tablet Take 0.25 mg by mouth daily as needed for anxiety., Until Discontinued, Historical Med    aspirin EC 81 MG tablet Take 81 mg by mouth daily., Until Discontinued, Historical Med    Calcium Carbonate-Vitamin D (CALCIUM 600+D) 600-400 MG-UNIT  tablet Take 1 tablet by mouth 2 (two) times daily., Until Discontinued, Historical Med    cetirizine (ZYRTEC) 10 MG tablet Take 10 mg by mouth daily., Until Discontinued, Historical Med    chlorpheniramine-HYDROcodone (TUSSIONEX) 10-8 MG/5ML SUER Take 5 mLs by mouth every 12 (twelve) hours as needed for cough., Starting 08/07/2015, Until Discontinued, Normal    cholecalciferol (VITAMIN D) 1000 units tablet Take 1,000 Units by mouth daily., Until Discontinued, Historical Med    colestipol (COLESTID) 1 g tablet Take 2 g by mouth 2 (two) times daily., Until Discontinued, Historical Med    Diphenhyd-Hydrocort-Nystatin (FIRST-DUKES MOUTHWASH MT) Take 5 mLs by mouth 3 (three) times daily. Pt is to swish and spit., Until Discontinued, Historical Med    esomeprazole (NEXIUM) 40 MG capsule Take 40 mg by mouth 2 (two) times daily before a meal., Until Discontinued, Historical Med    FeFum-FePo-FA-B Cmp-C-Zn-Mn-Cu (TANDEM PLUS) 162-115.2-1 MG CAPS Take 1 capsule by mouth daily., Until Discontinued, Historical Med    Fluticasone-Salmeterol (ADVAIR) 250-50 MCG/DOSE AEPB Inhale 1 puff into the lungs 2 (two) times daily., Until Discontinued, Historical Med    furosemide (LASIX) 20 MG tablet Take 20 mg by mouth daily., Until Discontinued, Historical Med    gabapentin (NEURONTIN) 300 MG capsule Take 300 mg by mouth 4 (four) times daily., Until Discontinued, Historical Med    guaiFENesin (MUCINEX) 600 MG 12 hr tablet Take 1 tablet (600 mg total) by mouth 2 (two) times daily., Starting 08/07/2015, Until Discontinued, Normal    guaiFENesin-dextromethorphan (ROBITUSSIN DM) 100-10 MG/5ML syrup Take 5 mLs by mouth every 4 (four) hours as needed for cough., Starting 08/07/2015, Until Discontinued, Normal    hydroxychloroquine (PLAQUENIL) 200 MG tablet Take 1 tablet (200 mg total) by mouth 2 (two) times daily., Starting 05/13/2013, Until Discontinued, Normal    metoprolol succinate (TOPROL-XL) 50 MG 24 hr tablet Take  50 mg by mouth daily. Take with or immediately following a meal., Until Discontinued, Historical Med    Multiple Vitamin (MULTIVITAMIN WITH MINERALS) TABS tablet Take 1 tablet by mouth daily., Until Discontinued, Historical Med    potassium chloride (MICRO-K) 10 MEQ CR capsule Take 20 mEq by mouth 2 (two) times daily., Until Discontinued, Historical Med    !! pramipexole (MIRAPEX) 0.25 MG tablet Take 0.5 mg by mouth 2 (two) times daily., Until Discontinued, Historical Med    !! pramipexole (MIRAPEX) 1 MG tablet Take 1 tablet (1 mg total) by mouth at bedtime., Starting 08/07/2015, Until Discontinued, Normal    simvastatin (ZOCOR) 20 MG tablet Take 20 mg by mouth at bedtime., Until Discontinued, Historical Med    sodium chloride (OCEAN) 0.65 % nasal spray Place 1 spray into the nose as needed for congestion., Until Discontinued, Historical Med    tiotropium (SPIRIVA) 18 MCG inhalation capsule Place 1 capsule (18 mcg total) into inhaler and inhale daily., Starting 08/07/2015, Until Discontinued, Normal    traMADol (ULTRAM) 50 MG tablet Take 50 mg by mouth 2 (two) times daily as needed for moderate pain., Until Discontinued, Historical Med    !!  pramipexole (MIRAPEX) 0.5 MG tablet Take 1 tablet (0.5 mg total) by mouth 2 (two) times daily as needed (restless legs)., Starting 08/07/2015, Until Discontinued, Normal     !! - Potential duplicate medications found. Please discuss with provider.    STOP taking these medications     predniSONE (DELTASONE) 10 MG tablet          DISCHARGE INSTRUCTIONS:   If you experience worsening of your admission symptoms, develop shortness of breath, life threatening emergency, suicidal or homicidal thoughts you must seek medical attention immediately by calling 911 or calling your MD immediately  if symptoms less severe.  You Must read complete instructions/literature along with all the possible adverse reactions/side effects for all the Medicines you take and  that have been prescribed to you. Take any new Medicines after you have completely understood and accept all the possible adverse reactions/side effects.   Please note  You were cared for by a hospitalist during your hospital stay. If you have any questions about your discharge medications or the care you received while you were in the hospital after you are discharged, you can call the unit and asked to speak with the hospitalist on call if the hospitalist that took care of you is not available. Once you are discharged, your primary care physician will handle any further medical issues. Please note that NO REFILLS for any discharge medications will be authorized once you are discharged, as it is imperative that you return to your primary care physician (or establish a relationship with a primary care physician if you do not have one) for your aftercare needs so that they can reassess your need for medications and monitor your lab values.    Today   SUBJECTIVE   no complaint.    VITAL SIGNS:  Blood pressure 115/42, pulse 110, temperature 98 F (36.7 C), temperature source Oral, resp. rate 19, height 5\' 4"  (1.626 m), weight 80.015 kg (176 lb 6.4 oz), SpO2 92 %.  I/O:   Intake/Output Summary (Last 24 hours) at 08/22/15 1552 Last data filed at 08/22/15 0830  Gross per 24 hour  Intake 1887.5 ml  Output   1150 ml  Net  737.5 ml    PHYSICAL EXAMINATION:  GENERAL:  75 y.o.-year-old patient lying in the bed with no acute distress.  obese. EYES: Pupils equal, round, reactive to light and accommodation. No scleral icterus. Extraocular muscles intact.  HEENT: Head atraumatic, normocephalic. Oropharynx and nasopharynx clear.  NECK:  Supple, no jugular venous distention. No thyroid enlargement, no tenderness.  LUNGS: Normal breath sounds bilaterally, no wheezing, rales,rhonchi or crepitation. No use of accessory muscles of respiration.  CARDIOVASCULAR: S1, S2 normal. No murmurs, rubs, or  gallops.  ABDOMEN: Soft, non-tender, non-distended. Bowel sounds present. No organomegaly or mass.  EXTREMITIES: No pedal edema, cyanosis, or clubbing.  NEUROLOGIC: Cranial nerves II through XII are intact. Muscle strength 5/5 in all extremities. Sensation intact. Gait not checked.  PSYCHIATRIC: The patient is alert and oriented x 3.  SKIN: No obvious rash, lesion, or ulcer.   DATA REVIEW:   CBC  Recent Labs Lab 08/20/15 0601  WBC 10.7  HGB 9.0*  HCT 26.3*  PLT 145*    Chemistries   Recent Labs Lab 08/18/15 1439  08/20/15 0601  NA 138  < > 134*  K 3.8  < > 4.4  CL 101  < > 102  CO2 28  < > 29  GLUCOSE 116*  < > 120*  BUN 19  < >  14  CREATININE 1.19*  < > 0.79  CALCIUM 8.7*  < > 8.5*  AST 41  --   --   ALT 29  --   --   ALKPHOS 46  --   --   BILITOT 0.8  --   --   < > = values in this interval not displayed.  Cardiac Enzymes  Recent Labs Lab 08/19/15 0600  TROPONINI 0.08*    Microbiology Results  Results for orders placed or performed during the hospital encounter of 08/18/15  Rapid Influenza A&B Antigens ([facility] only)     Status: None   Collection Time: 08/18/15  3:02 PM  Result Value Ref Range Status   Influenza A ([facility]) NEGATIVE  Final   Influenza B ([facility]) NEGATIVE  Final  Blood Culture (routine x 2)     Status: None (Preliminary result)   Collection Time: 08/18/15  3:39 PM  Result Value Ref Range Status   Specimen Description BLOOD RIGHT ARM  Final   Special Requests BOTTLES DRAWN AEROBIC AND ANAEROBIC  1CC  Final   Culture NO GROWTH 4 DAYS  Final   Report Status PENDING  Incomplete  Blood Culture (routine x 2)     Status: None (Preliminary result)   Collection Time: 08/18/15  3:50 PM  Result Value Ref Range Status   Specimen Description BLOOD LEFT HAND  Final   Special Requests BOTTLES DRAWN AEROBIC AND ANAEROBIC  1CC  Final   Culture NO GROWTH 4 DAYS  Final   Report Status PENDING  Incomplete  Urine culture     Status: None   Collection Time:  08/18/15  5:38 PM  Result Value Ref Range Status   Specimen Description URINE, CLEAN CATCH  Final   Special Requests NONE  Final   Culture MULTIPLE SPECIES PRESENT, SUGGEST RECOLLECTION  Final   Report Status 08/20/2015 FINAL  Final  MRSA PCR Screening     Status: None   Collection Time: 08/19/15  3:31 PM  Result Value Ref Range Status   MRSA by PCR NEGATIVE NEGATIVE Final    Comment:        The GeneXpert MRSA Assay (FDA approved for NASAL specimens only), is one component of a comprehensive MRSA colonization surveillance program. It is not intended to diagnose MRSA infection nor to guide or monitor treatment for MRSA infections.     RADIOLOGY:  No results found.      Management plans discussed with the patient, family and they are in agreement.  CODE STATUS:     Code Status Orders        Start     Ordered   08/18/15 1628  Full code   Continuous     08/18/15 1628    Code Status History    Date Active Date Inactive Code Status Order ID Comments User Context   08/03/2015  9:49 AM 08/07/2015  2:11 PM Full Code [id number]  Saundra Shelling, MD Inpatient      TOTAL TIME TAKING CARE OF THIS PATIENT: 36 minutes.    Demetrios Loll M.D on 08/22/2015 at 3:52 PM  Between 7am to 6pm - Pager - [phone]  After 6pm go to [email or web address] - password [email or web address] [facility]  Office  [phone]  CC: Primary care physician; [name], MD

## 2015-08-22 NOTE — Progress Notes (Signed)
PT TO BE DISCHARGED TODAY. IV'S AND TELE REMOVED  [place] INSTRUCTIONS AND PRESCRIP GIVEN TO PT AND DAUGHTERS. [place] VIA W.C. ACCOMPANIED BY FAMILY

## 2015-08-22 NOTE — Care Management Note (Signed)
Case Management Note  Patient Details  Name: Kaitlyn Huff MRN: [id number] Date of Birth: 1940/08/22  Subjective/Objective:         Referral faxed to [facility] to resume home health PT, RN, Aid.  Dr Bridgett Larsson will sign amended discharge order to resume home health.            Action/Plan:   Expected Discharge Date:                  Expected Discharge Plan:     In-House Referral:     Discharge planning Services     Post Acute Care Choice:    Choice offered to:     DME Arranged:    DME Agency:     HH Arranged:    [place] Agency:     Status of Service:     Medicare Important Message Given:  Yes Date Medicare IM Given:    Medicare IM give by:    Date Additional Medicare IM Given:    Additional Medicare Important Message give by:     If discussed at [place] of Stay Meetings, dates discussed:    Additional Comments:  Jaylon Grode A, RN 08/22/2015, 10:27 AM

## 2015-08-22 NOTE — Discharge Instructions (Signed)
Heart healthy diet. Activity as tolerated. Continue home O2 [place] 2L [facility]

## 2015-08-23 DIAGNOSIS — I5031 Acute diastolic (congestive) heart failure: Secondary | ICD-10-CM | POA: Diagnosis not present

## 2015-08-23 DIAGNOSIS — J471 Bronchiectasis with (acute) exacerbation: Secondary | ICD-10-CM | POA: Diagnosis not present

## 2015-08-23 DIAGNOSIS — I340 Nonrheumatic mitral (valve) insufficiency: Secondary | ICD-10-CM | POA: Diagnosis not present

## 2015-08-23 DIAGNOSIS — I361 Nonrheumatic tricuspid (valve) insufficiency: Secondary | ICD-10-CM | POA: Diagnosis not present

## 2015-08-23 DIAGNOSIS — I248 Other forms of acute ischemic heart disease: Secondary | ICD-10-CM | POA: Diagnosis not present

## 2015-08-23 DIAGNOSIS — I110 Hypertensive heart disease with heart failure: Secondary | ICD-10-CM | POA: Diagnosis not present

## 2015-08-23 LAB — CULTURE, BLOOD (ROUTINE X 2)
CULTURE: NO GROWTH
CULTURE: NO GROWTH

## 2015-08-24 ENCOUNTER — Telehealth: Payer: Self-pay | Admitting: Internal Medicine

## 2015-08-24 ENCOUNTER — Other Ambulatory Visit: Payer: Self-pay | Admitting: Internal Medicine

## 2015-08-24 DIAGNOSIS — J189 Pneumonia, unspecified organism: Secondary | ICD-10-CM

## 2015-08-24 MED ORDER — LEVOFLOXACIN 750 MG PO TABS: 750.0000 mg | ORAL_TABLET | Freq: Every day | ORAL | Status: DC

## 2015-08-24 NOTE — Telephone Encounter (Signed)
HFU, Pt was discharged from hospital on 08/22/2015. Diagnosis is Pneumonia. No appt avail to sch. Call pt @ [phone]. Pt also states the medication cefpodoxime (VANTIN) 200 MG tablet gave her a reaction of swollen ankles and feet. Pt was told that the medication has penicillin in it, Pt states she cannot take penicillin. Pharmacy is [place], [place]. Thank you!

## 2015-08-24 NOTE — Telephone Encounter (Signed)
Please advise, Medication was started in the hospital.  Thanks

## 2015-08-24 NOTE — Telephone Encounter (Signed)
The part of this message that's for you is the medication part. Thank you!

## 2015-08-24 NOTE — Telephone Encounter (Signed)
Spoke to ID who reviewed cxr and allergies.  Pt notified to start levaquin 750mg  q day.  rx sent in.  Pt aware.  Agreed to pulmonary referral.  Order placed for referral.

## 2015-08-25 ENCOUNTER — Telehealth: Payer: Self-pay

## 2015-08-25 NOTE — Telephone Encounter (Signed)
Transition Care Management Follow-up Telephone Call   Date discharged?08/22/15   How have you been since you were released from the hospital? Doing better since medication change. No problems with SOB.  Oxygen when sleeping. No N/V/D.     Do you understand why you were in the hospital? Yes, I had pneumonia   Do you understand the discharge instructions? Yes, Oxygen in use when needed, moving slowly between activities. [facility] and PT nurse visits.   Where were you discharged to? Home   Items Reviewed:  Medications reviewed: Yes, taking all medications without issues and as appropriate.  Allergies reviewed: Yes, ok since the medication changes.  Dietary changes reviewed: Yes, low sodium, no issues.  Referrals reviewed: Pulmonary appointment scheduled.   Functional Questionnaire:   Activities of Daily Living (ADLs):   She states they are independent in the following: Independent in all ADLs States they require assistance with the following: No problems with ADLs   Any transportation issues/concerns?: No   Any patient concerns? None   Confirmed importance and date/time of follow-up visits scheduled Yes, appointment scheduled 08/28/15  Provider Appointment booked with Dr. Nicki Reaper (PCP)  Confirmed with patient if condition begins to worsen call PCP or go to the ER.  Patient was given the office number and encouraged to call back with question or concerns.  : Yes, patient verbalized understanding.

## 2015-08-26 DIAGNOSIS — I5031 Acute diastolic (congestive) heart failure: Secondary | ICD-10-CM | POA: Diagnosis not present

## 2015-08-26 DIAGNOSIS — I361 Nonrheumatic tricuspid (valve) insufficiency: Secondary | ICD-10-CM | POA: Diagnosis not present

## 2015-08-26 DIAGNOSIS — I340 Nonrheumatic mitral (valve) insufficiency: Secondary | ICD-10-CM | POA: Diagnosis not present

## 2015-08-26 DIAGNOSIS — I248 Other forms of acute ischemic heart disease: Secondary | ICD-10-CM | POA: Diagnosis not present

## 2015-08-26 DIAGNOSIS — J471 Bronchiectasis with (acute) exacerbation: Secondary | ICD-10-CM | POA: Diagnosis not present

## 2015-08-26 DIAGNOSIS — I110 Hypertensive heart disease with heart failure: Secondary | ICD-10-CM | POA: Diagnosis not present

## 2015-08-28 ENCOUNTER — Encounter: Payer: Self-pay | Admitting: Internal Medicine

## 2015-08-28 ENCOUNTER — Ambulatory Visit (INDEPENDENT_AMBULATORY_CARE_PROVIDER_SITE_OTHER): Payer: Medicare Other | Admitting: Internal Medicine

## 2015-08-28 VITALS — BP 122/80 | HR 64 | Temp 97.6°F | Resp 18 | Ht 64.0 in | Wt 164.4 lb

## 2015-08-28 DIAGNOSIS — J189 Pneumonia, unspecified organism: Secondary | ICD-10-CM | POA: Diagnosis not present

## 2015-08-28 DIAGNOSIS — D649 Anemia, unspecified: Secondary | ICD-10-CM | POA: Diagnosis not present

## 2015-08-28 DIAGNOSIS — Z5189 Encounter for other specified aftercare: Secondary | ICD-10-CM

## 2015-08-28 DIAGNOSIS — I10 Essential (primary) hypertension: Secondary | ICD-10-CM | POA: Diagnosis not present

## 2015-08-28 DIAGNOSIS — K219 Gastro-esophageal reflux disease without esophagitis: Secondary | ICD-10-CM

## 2015-08-28 DIAGNOSIS — M069 Rheumatoid arthritis, unspecified: Secondary | ICD-10-CM | POA: Diagnosis not present

## 2015-08-28 DIAGNOSIS — R634 Abnormal weight loss: Secondary | ICD-10-CM

## 2015-08-28 LAB — CBC WITH DIFFERENTIAL/PLATELET
BASOS ABS: 0 10*3/uL (ref 0.0–0.1)
Basophils Relative: 0.4 % (ref 0.0–3.0)
Eosinophils Absolute: 0.1 10*3/uL (ref 0.0–0.7)
Eosinophils Relative: 1.3 % (ref 0.0–5.0)
HEMATOCRIT: 33.9 % — AB (ref 36.0–46.0)
HEMOGLOBIN: 11.2 g/dL — AB (ref 12.0–15.0)
LYMPHS PCT: 28.4 % (ref 12.0–46.0)
Lymphs Abs: 1.8 10*3/uL (ref 0.7–4.0)
MCHC: 33 g/dL (ref 30.0–36.0)
MCV: 92.6 fl (ref 78.0–100.0)
MONOS PCT: 8.2 % (ref 3.0–12.0)
Monocytes Absolute: 0.5 10*3/uL (ref 0.1–1.0)
NEUTROS ABS: 4 10*3/uL (ref 1.4–7.7)
Neutrophils Relative %: 61.7 % (ref 43.0–77.0)
Platelets: 323 10*3/uL (ref 150.0–400.0)
RBC: 3.66 Mil/uL — AB (ref 3.87–5.11)
RDW: 13.5 % (ref 11.5–15.5)
WBC: 6.4 10*3/uL (ref 4.0–10.5)

## 2015-08-28 LAB — BASIC METABOLIC PANEL
BUN: 7 mg/dL (ref 6–23)
CHLORIDE: 100 meq/L (ref 96–112)
CO2: 33 mEq/L — ABNORMAL HIGH (ref 19–32)
Calcium: 9.3 mg/dL (ref 8.4–10.5)
Creatinine, Ser: 0.92 mg/dL (ref 0.40–1.20)
GFR: 63.72 mL/min (ref >60.00)
GLUCOSE: 83 mg/dL (ref 70–99)
Potassium: 3.9 mEq/L (ref 3.5–5.1)
Sodium: 140 mEq/L (ref 135–145)

## 2015-08-28 NOTE — Progress Notes (Signed)
Pre-visit discussion using our clinic review tool. No additional management support is needed unless otherwise documented below in the visit note.  

## 2015-08-28 NOTE — Progress Notes (Signed)
Patient ID: Kaitlyn Huff, female   DOB: 11-27-1940, 75 y.o.   MRN: [id number]   Subjective:    Patient ID: Kaitlyn Huff, female    DOB: [birth year], 75 y.o.   MRN: [id number]  HPI  Patient with past history of RA, hypercholesterolemia, GERD, anemia and hypertension.  Comes in today for a hospital follow up.  Was recently admitted and found to have pneumonia. See hospital discharge summary for details.   Discharged on vantin.  Had rash.  Changed to levaquin.  Doing better.  Still some cough.  Breathing better. Eating and drinking.  No nausea or vomiting.  Bowels stable.  Energy improving.  hgb decreased in hospital.  Joints stable.  She is accompanied by her daughter.  History obtained from both of them.     Past Medical History  Diagnosis Date  . Rheumatoid arthritis(714.0)     MTX transaminitis, Leflunomide (rash), enbrel, plaquinil, prednisone, remicade, Imuran (transaminitis)  . Hyperlipidemia   . Diverticulitis   . Osteoarthritis   . GERD (gastroesophageal reflux disease)   . Hypertension   . Anemia   . Positive PPD     s/p INH (2006)  . Osteoporosis     actonel  . 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 Abdominal hysterectomy    . Tracheostomy  1959  . Tubal ligation    . Cervical cone biopsy    . Ovary surgery    . Cholecystectomy  06/22/14  . Colonoscopy with propofol N/A 07/09/2015    Procedure: COLONOSCOPY WITH PROPOFOL;  Surgeon: Manya Silvas, MD;  Location: [facility] ENDOSCOPY;  Service: Endoscopy;  Laterality: N/A;  . Esophagogastroduodenoscopy (egd) with propofol N/A 07/09/2015    Procedure: ESOPHAGOGASTRODUODENOSCOPY (EGD) WITH PROPOFOL;  Surgeon: Manya Silvas, MD;  Location: [facility] ENDOSCOPY;  Service: Endoscopy;  Laterality: N/A;   Family History  Problem Relation Age of Onset  . Heart disease Father     MI  . Heart disease Mother   . Valvular heart disease Mother   . Colon cancer Neg Hx   .  Breast cancer Sister 76   Social History   Social History  . Marital Status: Widowed    Spouse Name: N/A  . Number of Children: 4  . Years of Education: N/A   Occupational History  . retired    Social History Main Topics  . Smoking status: Never Smoker   . Smokeless tobacco: Never Used  . Alcohol Use: No  . Drug Use: No  . Sexual Activity: Not Asked   Other Topics Concern  . None   Social History Narrative   Lives with family at home    Outpatient Encounter Prescriptions as of 08/28/2015  Medication Sig  . acidophilus (RISAQUAD) CAPS capsule Take 1 capsule by mouth daily.  Marland Kitchen albuterol (PROVENTIL HFA;VENTOLIN HFA) 108 (90 Base) MCG/ACT inhaler Inhale 2 puffs into the lungs every 4 (four) hours as needed for wheezing or shortness of breath.  Marland Kitchen albuterol (PROVENTIL) (2.5 MG/3ML) 0.083% nebulizer solution Take 2.5 mg by nebulization every 4 (four) hours as needed for wheezing or shortness of breath.  . ALPRAZolam (XANAX) 0.25 MG tablet Take 0.25 mg by mouth daily as needed for anxiety.  Marland Kitchen aspirin EC 81 MG tablet Take 81 mg by mouth daily.  . Calcium Carbonate-Vitamin D (CALCIUM 600+D) 600-400 MG-UNIT tablet Take 1 tablet by mouth 2 (two) times daily.  . cefpodoxime (  VANTIN) 200 MG tablet Take 1 tablet (200 mg total) by mouth 2 (two) times daily.  . cetirizine (ZYRTEC) 10 MG tablet Take 10 mg by mouth daily.  . chlorpheniramine-HYDROcodone (TUSSIONEX) 10-8 MG/5ML SUER Take 5 mLs by mouth every 12 (twelve) hours as needed for cough.  . cholecalciferol (VITAMIN D) 1000 units tablet Take 1,000 Units by mouth daily.  . colestipol (COLESTID) 1 g tablet Take 2 g by mouth 2 (two) times daily.  . Diphenhyd-Hydrocort-Nystatin (FIRST-DUKES MOUTHWASH MT) Take 5 mLs by mouth 3 (three) times daily. Pt is to swish and spit.  Marland Kitchen esomeprazole (NEXIUM) 40 MG capsule Take 40 mg by mouth 2 (two) times daily before a meal.  . FeFum-FePo-FA-B Cmp-C-Zn-Mn-Cu (TANDEM PLUS) 162-115.2-1 MG CAPS Take 1  capsule by mouth daily.  . Fluticasone-Salmeterol (ADVAIR) 250-50 MCG/DOSE AEPB Inhale 1 puff into the lungs 2 (two) times daily.  . furosemide (LASIX) 20 MG tablet Take 20 mg by mouth daily.  Marland Kitchen gabapentin (NEURONTIN) 300 MG capsule Take 300 mg by mouth 4 (four) times daily.  Marland Kitchen guaiFENesin (MUCINEX) 600 MG 12 hr tablet Take 1 tablet (600 mg total) by mouth 2 (two) times daily.  Marland Kitchen guaiFENesin-dextromethorphan (ROBITUSSIN DM) 100-10 MG/5ML syrup Take 5 mLs by mouth every 4 (four) hours as needed for cough.  . hydroxychloroquine (PLAQUENIL) 200 MG tablet Take 1 tablet (200 mg total) by mouth 2 (two) times daily.  Marland Kitchen levofloxacin (LEVAQUIN) 750 MG tablet Take 1 tablet (750 mg total) by mouth daily.  . metoprolol succinate (TOPROL-XL) 50 MG 24 hr tablet Take 50 mg by mouth daily. Take with or immediately following a meal.  . Multiple Vitamin (MULTIVITAMIN WITH MINERALS) TABS tablet Take 1 tablet by mouth daily.  . potassium chloride (MICRO-K) 10 MEQ CR capsule Take 20 mEq by mouth 2 (two) times daily.  . pramipexole (MIRAPEX) 0.25 MG tablet Take 0.5 mg by mouth 2 (two) times daily.  . pramipexole (MIRAPEX) 0.5 MG tablet Take 1 tablet (0.5 mg total) by mouth 2 (two) times daily as needed (restless legs).  . pramipexole (MIRAPEX) 1 MG tablet Take 1 tablet (1 mg total) by mouth at bedtime.  . simvastatin (ZOCOR) 20 MG tablet TAKE 1 TABLET DAILY  . sodium chloride (OCEAN) 0.65 % nasal spray Place 1 spray into the nose as needed for congestion.  Marland Kitchen tiotropium (SPIRIVA) 18 MCG inhalation capsule Place 1 capsule (18 mcg total) into inhaler and inhale daily.  . traMADol (ULTRAM) 50 MG tablet Take 50 mg by mouth 2 (two) times daily as needed for moderate pain.   No facility-administered encounter medications on file as of 08/28/2015.    Review of Systems  Constitutional: Negative for fever.       Eating well.    HENT: Negative for congestion and sinus pressure.   Eyes: Negative for discharge and redness.    Respiratory: Positive for cough. Negative for chest tightness and shortness of breath.   Cardiovascular: Negative for chest pain, palpitations and leg swelling.  Gastrointestinal: Negative for nausea, vomiting, abdominal pain and diarrhea.  Genitourinary: Negative for dysuria and difficulty urinating.  Musculoskeletal: Negative for myalgias.       No increased joint pain.    Skin: Negative for color change and rash.  Neurological: Negative for dizziness, light-headedness and headaches.  Psychiatric/Behavioral: Negative for dysphoric mood and agitation.       Objective:    Physical Exam  Constitutional: She appears well-developed and well-nourished. No distress.  HENT:  Nose: Nose normal.  Mouth/Throat: Oropharynx is clear and moist.  Eyes: Conjunctivae are normal. Right eye exhibits no discharge. Left eye exhibits no discharge.  Neck: Neck supple. No thyromegaly present.  Cardiovascular: Normal rate and regular rhythm.   Pulmonary/Chest: Breath sounds normal. No respiratory distress. She has no wheezes.  Abdominal: Soft. Bowel sounds are normal. There is no tenderness.  Musculoskeletal: She exhibits no edema or tenderness.  Lymphadenopathy:    She has no cervical adenopathy.  Skin: No rash noted. No erythema.  Psychiatric: She has a normal mood and affect. Her behavior is normal.    BP 122/80 mmHg  Pulse 64  Temp(Src) 97.6 F (36.4 C) (Oral)  Resp 18  Ht 5\' 4"  (1.626 m)  Wt 164 lb 6 oz (74.56 kg)  BMI 28.20 kg/m2  SpO2 95% Wt Readings from Last 3 Encounters:  08/28/15 164 lb 6 oz (74.56 kg)  08/18/15 176 lb 6.4 oz (80.015 kg)  08/10/15 169 lb 4 oz (76.771 kg)     Lab Results  Component Value Date   WBC 6.4 08/28/2015   HGB 11.2* 08/28/2015   HCT 33.9* 08/28/2015   PLT 323.0 08/28/2015   GLUCOSE 83 08/28/2015   CHOL 92 08/04/2015   TRIG 53 08/04/2015   HDL 51 08/04/2015   LDLDIRECT 73.7 08/20/2013   LDLCALC 30 08/04/2015   ALT 29 08/18/2015   AST 41  08/18/2015   NA 140 08/28/2015   K 3.9 08/28/2015   CL 100 08/28/2015   CREATININE 0.92 08/28/2015   BUN 7 08/28/2015   CO2 33* 08/28/2015   TSH 2.72 04/13/2015   INR 1.07 08/03/2015    Dg Chest 2 View  08/18/2015  CLINICAL DATA:  Weakness with nausea and vomiting EXAM: CHEST  2 VIEW COMPARISON:  Chest CT August 06, 2015; chest radiograph August 03, 2015 FINDINGS: There is extensive airspace consolidation throughout much of the right lung with involvement of portions of the right upper and middle lobes. Left lung is clear. Heart size and pulmonary vascular normal. No adenopathy. No bone lesions. IMPRESSION: Extensive airspace consolidation on the right, consistent with pneumonia. Pneumonia is primarily seen in the right middle and anterior segment right upper lobe regions. Left lung clear. No change in cardiac silhouette. Electronically Signed   By: Lowella Grip III M.D.   On: 08/18/2015 15:41       Assessment & Plan:   Problem List Items Addressed This Visit    Anemia - Primary    hgb decreased in the hospital.  Recheck cbc today.        Relevant Orders   CBC with Differential/Platelet (Completed)   CAP (community acquired pneumonia)    cxr with pneumonia.  Had rash with vantin.  Changed to levaquin.  Doing better.  Needs pulmonary evaluation.  Has appt.  Will need f/u cxr at pulmonary appt.  Follow.        GERD (gastroesophageal reflux disease)    On nexium.  Upper symptoms controlled.        Hypertension    Blood pressure under good control.  Continue same medication regimen.  Follow pressures.  Follow metabolic panel.        Relevant Orders   Basic metabolic panel (Completed)   Rheumatoid arthritis ([place])    Followed by Dr [name].  Will need to be cleared from infection before receiving next infusion.        Weight loss    Has lost weight.  States is eating.  Follow.  [name], MD

## 2015-08-30 ENCOUNTER — Encounter: Payer: Self-pay | Admitting: Internal Medicine

## 2015-08-30 DIAGNOSIS — J189 Pneumonia, unspecified organism: Secondary | ICD-10-CM | POA: Insufficient documentation

## 2015-08-30 NOTE — Assessment & Plan Note (Signed)
On nexium.  Upper symptoms controlled.  

## 2015-08-30 NOTE — Assessment & Plan Note (Signed)
Has lost weight.  States is eating.  Follow.

## 2015-08-30 NOTE — Assessment & Plan Note (Signed)
hgb decreased in the hospital.  Recheck cbc today.

## 2015-08-30 NOTE — Assessment & Plan Note (Signed)
Blood pressure under good control.  Continue same medication regimen.  Follow pressures.  Follow metabolic panel.   

## 2015-08-30 NOTE — Assessment & Plan Note (Signed)
Followed by Dr [name].  Will need to be cleared from infection before receiving next infusion.

## 2015-08-30 NOTE — Assessment & Plan Note (Signed)
cxr with pneumonia.  Had rash with vantin.  Changed to levaquin.  Doing better.  Needs pulmonary evaluation.  Has appt.  Will need f/u cxr at pulmonary appt.  Follow.

## 2015-08-31 ENCOUNTER — Encounter: Payer: Self-pay | Admitting: *Deleted

## 2015-09-01 DIAGNOSIS — I110 Hypertensive heart disease with heart failure: Secondary | ICD-10-CM | POA: Diagnosis not present

## 2015-09-01 DIAGNOSIS — I248 Other forms of acute ischemic heart disease: Secondary | ICD-10-CM | POA: Diagnosis not present

## 2015-09-01 DIAGNOSIS — I361 Nonrheumatic tricuspid (valve) insufficiency: Secondary | ICD-10-CM | POA: Diagnosis not present

## 2015-09-01 DIAGNOSIS — I5031 Acute diastolic (congestive) heart failure: Secondary | ICD-10-CM | POA: Diagnosis not present

## 2015-09-01 DIAGNOSIS — J471 Bronchiectasis with (acute) exacerbation: Secondary | ICD-10-CM | POA: Diagnosis not present

## 2015-09-01 DIAGNOSIS — I340 Nonrheumatic mitral (valve) insufficiency: Secondary | ICD-10-CM | POA: Diagnosis not present

## 2015-09-03 ENCOUNTER — Other Ambulatory Visit: Payer: Self-pay

## 2015-09-03 DIAGNOSIS — J471 Bronchiectasis with (acute) exacerbation: Secondary | ICD-10-CM | POA: Diagnosis not present

## 2015-09-03 DIAGNOSIS — I248 Other forms of acute ischemic heart disease: Secondary | ICD-10-CM | POA: Diagnosis not present

## 2015-09-03 DIAGNOSIS — I5031 Acute diastolic (congestive) heart failure: Secondary | ICD-10-CM | POA: Diagnosis not present

## 2015-09-03 DIAGNOSIS — I340 Nonrheumatic mitral (valve) insufficiency: Secondary | ICD-10-CM | POA: Diagnosis not present

## 2015-09-03 DIAGNOSIS — I110 Hypertensive heart disease with heart failure: Secondary | ICD-10-CM | POA: Diagnosis not present

## 2015-09-03 DIAGNOSIS — I361 Nonrheumatic tricuspid (valve) insufficiency: Secondary | ICD-10-CM | POA: Diagnosis not present

## 2015-09-03 MED ORDER — ALBUTEROL SULFATE HFA 108 (90 BASE) MCG/ACT IN AERS: 2.0000 | INHALATION_SPRAY | RESPIRATORY_TRACT | Status: DC | PRN

## 2015-09-04 LAB — NM MYOCAR MULTI W/SPECT W/WALL MOTION / EF
CHL CUP STRESS STAGE 1 HR: 68 {beats}/min
CHL CUP STRESS STAGE 2 HR: 69 {beats}/min
CHL CUP STRESS STAGE 2 SPEED: 0 mph
CHL CUP STRESS STAGE 4 HR: 75 {beats}/min
CHL CUP STRESS STAGE 4 SPEED: 0 mph
CHL CUP STRESS STAGE 5 DBP: 60 mmHg
CHL CUP STRESS STAGE 5 GRADE: 0 %
CHL CUP STRESS STAGE 5 SBP: 129 mmHg
CSEPPMHR: 50 %
Estimated workload: 1 METS
Exercise duration (min): 1 min
Exercise duration (sec): 4 s
LV dias vol: 90 mL
LVSYSVOL: 34 mL
Peak HR: 75 {beats}/min
Rest HR: 66 {beats}/min
SDS: 0
SRS: 0
SSS: 3
Stage 2 Grade: 0 %
Stage 3 Grade: 0 %
Stage 3 HR: 69 {beats}/min
Stage 3 Speed: 0 mph
Stage 4 Grade: 0 %
Stage 5 HR: 82 {beats}/min
Stage 5 Speed: 0 mph
TID: 0.96

## 2015-09-10 DIAGNOSIS — I5031 Acute diastolic (congestive) heart failure: Secondary | ICD-10-CM | POA: Diagnosis not present

## 2015-09-10 DIAGNOSIS — J471 Bronchiectasis with (acute) exacerbation: Secondary | ICD-10-CM | POA: Diagnosis not present

## 2015-09-10 DIAGNOSIS — I340 Nonrheumatic mitral (valve) insufficiency: Secondary | ICD-10-CM | POA: Diagnosis not present

## 2015-09-10 DIAGNOSIS — I110 Hypertensive heart disease with heart failure: Secondary | ICD-10-CM | POA: Diagnosis not present

## 2015-09-10 DIAGNOSIS — I361 Nonrheumatic tricuspid (valve) insufficiency: Secondary | ICD-10-CM | POA: Diagnosis not present

## 2015-09-10 DIAGNOSIS — I248 Other forms of acute ischemic heart disease: Secondary | ICD-10-CM | POA: Diagnosis not present

## 2015-09-11 ENCOUNTER — Other Ambulatory Visit: Payer: Self-pay | Admitting: Internal Medicine

## 2015-09-14 ENCOUNTER — Ambulatory Visit (INDEPENDENT_AMBULATORY_CARE_PROVIDER_SITE_OTHER): Payer: Medicare Other | Admitting: Internal Medicine

## 2015-09-14 ENCOUNTER — Encounter: Payer: Self-pay | Admitting: Internal Medicine

## 2015-09-14 VITALS — BP 110/60 | HR 68 | Temp 98.2°F | Resp 18 | Ht 64.0 in | Wt 168.1 lb

## 2015-09-14 DIAGNOSIS — J189 Pneumonia, unspecified organism: Secondary | ICD-10-CM

## 2015-09-14 DIAGNOSIS — I208 Other forms of angina pectoris: Secondary | ICD-10-CM | POA: Diagnosis not present

## 2015-09-14 DIAGNOSIS — D649 Anemia, unspecified: Secondary | ICD-10-CM

## 2015-09-14 DIAGNOSIS — M069 Rheumatoid arthritis, unspecified: Secondary | ICD-10-CM | POA: Diagnosis not present

## 2015-09-14 DIAGNOSIS — E7800 Pure hypercholesterolemia, unspecified: Secondary | ICD-10-CM

## 2015-09-14 DIAGNOSIS — Z86010 Personal history of colonic polyps: Secondary | ICD-10-CM

## 2015-09-14 DIAGNOSIS — K219 Gastro-esophageal reflux disease without esophagitis: Secondary | ICD-10-CM | POA: Diagnosis not present

## 2015-09-14 DIAGNOSIS — I10 Essential (primary) hypertension: Secondary | ICD-10-CM

## 2015-09-14 DIAGNOSIS — R197 Diarrhea, unspecified: Secondary | ICD-10-CM

## 2015-09-14 DIAGNOSIS — R634 Abnormal weight loss: Secondary | ICD-10-CM

## 2015-09-14 MED ORDER — NYSTATIN 100000 UNIT/GM EX CREA: 1.0000 "application " | TOPICAL_CREAM | Freq: Two times a day (BID) | CUTANEOUS | Status: DC

## 2015-09-14 MED ORDER — FIRST-DUKES MOUTHWASH MT SUSP: OROMUCOSAL | Status: DC

## 2015-09-14 NOTE — Progress Notes (Signed)
Patient ID: Kaitlyn Huff, female   DOB: 01-Apr-1941, 75 y.o.   MRN: [id number]   Subjective:    Patient ID: Kaitlyn Huff, female    DOB: Jun 10, 1941, 75 y.o.   MRN: [id number]  HPI  Patient with past history of RA followed by Dr [name], GERD, hypercholesterolemia and hypertension.  She comes in today for a scheduled follow up.  She is accompanied by her daughter.  History obtained from both of them.  She was recently hospitalized with pneumonia.  Treated with abx.  Completed levaquin.  Breathing better.  No increased cough or congestion.  Feels better.  Has been eating better.  Weight is up a few pounds from the last check.  Does report now having loose stools.  Persistent - 3-5 loose stools per day.  No abdominal pain.  No blood.  Does report some vaginal irritation and itching.  States localized to outer vagina.  Request cream.  She also reports starting to notice some increased discomfort in her neck and posterior shoulders, wrist and ankles.  No severe pain.  Has not seen Dr [name] recently.  Request earlier appt.     Past Medical History  Diagnosis Date  . Rheumatoid arthritis(714.0)     MTX transaminitis, Leflunomide (rash), enbrel, plaquinil, prednisone, remicade, Imuran (transaminitis)  . Hyperlipidemia   . Diverticulitis   . Osteoarthritis   . GERD (gastroesophageal reflux disease)   . Hypertension   . Anemia   . Positive PPD     s/p INH (2006)  . Osteoporosis     actonel  . 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 Abdominal hysterectomy    . Tracheostomy  1959  . Tubal ligation    . Cervical cone biopsy    . Ovary surgery    . Cholecystectomy  06/22/14  . Colonoscopy with propofol N/A 07/09/2015    Procedure: COLONOSCOPY WITH PROPOFOL;  Surgeon: Manya Silvas, MD;  Location: [facility] ENDOSCOPY;  Service: Endoscopy;  Laterality: N/A;  . Esophagogastroduodenoscopy (egd) with propofol N/A 07/09/2015   Procedure: ESOPHAGOGASTRODUODENOSCOPY (EGD) WITH PROPOFOL;  Surgeon: Manya Silvas, MD;  Location: [facility] ENDOSCOPY;  Service: Endoscopy;  Laterality: N/A;   Family History  Problem Relation Age of Onset  . Heart disease Father     MI  . Heart disease Mother   . Valvular heart disease Mother   . Colon cancer Neg Hx   . Breast cancer Sister 11   Social History   Social History  . Marital Status: Widowed    Spouse Name: N/A  . Number of Children: 4  . Years of Education: N/A   Occupational History  . retired    Social History Main Topics  . Smoking status: Never Smoker   . Smokeless tobacco: Never Used  . Alcohol Use: No  . Drug Use: No  . Sexual Activity: Not Asked   Other Topics Concern  . None   Social History Narrative   Lives with family at home    Outpatient Encounter Prescriptions as of 09/14/2015  Medication Sig  . acidophilus (RISAQUAD) CAPS capsule Take 1 capsule by mouth daily.  Marland Kitchen albuterol (PROVENTIL HFA;VENTOLIN HFA) 108 (90 Base) MCG/ACT inhaler Inhale 2 puffs into the lungs every 4 (four) hours as needed for wheezing or shortness of breath.  Marland Kitchen albuterol (PROVENTIL) (2.5 MG/3ML) 0.083% nebulizer solution Take 2.5 mg by nebulization every 4 (four) hours as needed  for wheezing or shortness of breath.  Marland Kitchen alendronate (FOSAMAX) 70 MG tablet Take by mouth.  . ALPRAZolam (XANAX) 0.25 MG tablet Take 0.25 mg by mouth daily as needed for anxiety.  Marland Kitchen aspirin EC 81 MG tablet Take 81 mg by mouth daily.  . Calcium Carbonate-Vitamin D (CALCIUM 600+D) 600-400 MG-UNIT tablet Take 1 tablet by mouth 2 (two) times daily.  . cefpodoxime (VANTIN) 200 MG tablet Take 1 tablet (200 mg total) by mouth 2 (two) times daily.  . cetirizine (ZYRTEC) 10 MG tablet Take 10 mg by mouth daily.  . chlorpheniramine-HYDROcodone (TUSSIONEX) 10-8 MG/5ML SUER Take 5 mLs by mouth every 12 (twelve) hours as needed for cough.  . cholecalciferol (VITAMIN D) 1000 units tablet Take 1,000 Units by mouth  daily.  . colestipol (COLESTID) 1 g tablet Take 2 g by mouth 2 (two) times daily.  . Diphenhyd-Hydrocort-Nystatin (FIRST-DUKES MOUTHWASH) SUSP Pt is to swish and spit 5 cc's tid  . esomeprazole (NEXIUM) 40 MG capsule Take 40 mg by mouth 2 (two) times daily before a meal.  . FeFum-FePo-FA-B Cmp-C-Zn-Mn-Cu (TANDEM PLUS) 162-115.2-1 MG CAPS Take 1 capsule by mouth daily.  . Fluticasone-Salmeterol (ADVAIR) 250-50 MCG/DOSE AEPB Inhale 1 puff into the lungs 2 (two) times daily.  . furosemide (LASIX) 20 MG tablet TAKE 1 TABLET DAILY  . gabapentin (NEURONTIN) 300 MG capsule Take 300 mg by mouth 4 (four) times daily.  Marland Kitchen guaiFENesin (MUCINEX) 600 MG 12 hr tablet Take 1 tablet (600 mg total) by mouth 2 (two) times daily.  Marland Kitchen guaiFENesin-dextromethorphan (ROBITUSSIN DM) 100-10 MG/5ML syrup Take 5 mLs by mouth every 4 (four) hours as needed for cough.  . hydroxychloroquine (PLAQUENIL) 200 MG tablet Take 1 tablet (200 mg total) by mouth 2 (two) times daily.  . metoprolol succinate (TOPROL-XL) 50 MG 24 hr tablet Take 50 mg by mouth daily. Take with or immediately following a meal.  . Multiple Vitamin (MULTIVITAMIN WITH MINERALS) TABS tablet Take 1 tablet by mouth daily.  . potassium chloride (MICRO-K) 10 MEQ CR capsule Take 20 mEq by mouth 2 (two) times daily.  . pramipexole (MIRAPEX) 0.25 MG tablet Take 0.5 mg by mouth 2 (two) times daily.  . pramipexole (MIRAPEX) 0.5 MG tablet Take 1 tablet (0.5 mg total) by mouth 2 (two) times daily as needed (restless legs).  . pramipexole (MIRAPEX) 1 MG tablet Take 1 tablet (1 mg total) by mouth at bedtime.  . simvastatin (ZOCOR) 20 MG tablet TAKE 1 TABLET DAILY  . sodium chloride (OCEAN) 0.65 % nasal spray Place 1 spray into the nose as needed for congestion.  Marland Kitchen tiotropium (SPIRIVA) 18 MCG inhalation capsule Place 1 capsule (18 mcg total) into inhaler and inhale daily.  . traMADol (ULTRAM) 50 MG tablet Take 50 mg by mouth 2 (two) times daily as needed for moderate pain.    . [DISCONTINUED] Diphenhyd-Hydrocort-Nystatin (FIRST-DUKES MOUTHWASH MT) Take 5 mLs by mouth 3 (three) times daily. Pt is to swish and spit.  . [DISCONTINUED] levofloxacin (LEVAQUIN) 750 MG tablet Take 1 tablet (750 mg total) by mouth daily.  Marland Kitchen nystatin cream (MYCOSTATIN) Apply 1 application topically 2 (two) times daily. Apply to affected area (outer vagina)   No facility-administered encounter medications on file as of 09/14/2015.    Review of Systems  Constitutional: Negative for fever.       Appetite improved.  Weight is up a few pounds.    HENT: Negative for congestion and sinus pressure.   Respiratory: Negative for cough, chest tightness and  shortness of breath.   Cardiovascular: Negative for chest pain, palpitations and leg swelling.  Gastrointestinal: Positive for diarrhea. Negative for nausea, vomiting and abdominal pain.  Genitourinary: Negative for dysuria and difficulty urinating.       Vaginal itching and irritation as outlined.   Musculoskeletal:       Shoulder and neck discomfort as outlined.  Some wrist discomfort.   Skin: Negative for color change and rash.  Neurological: Negative for dizziness, light-headedness and headaches.  Psychiatric/Behavioral: Negative for dysphoric mood and agitation.       Objective:    Physical Exam  Constitutional: She appears well-developed and well-nourished. No distress.  HENT:  Nose: Nose normal.  Mouth/Throat: Oropharynx is clear and moist.  Eyes: Conjunctivae are normal. Right eye exhibits no discharge. Left eye exhibits no discharge.  Neck: Neck supple. No thyromegaly present.  Cardiovascular: Normal rate and regular rhythm.   Pulmonary/Chest: Effort normal and breath sounds normal. No respiratory distress.  Abdominal: Soft. Bowel sounds are normal. There is no tenderness.  Genitourinary:  She declined.    Musculoskeletal: She exhibits no edema or tenderness.  Lymphadenopathy:    She has no cervical adenopathy.  Skin: No  rash noted. No erythema.  Psychiatric: She has a normal mood and affect. Her behavior is normal.    BP 110/60 mmHg  Pulse 68  Temp(Src) 98.2 F (36.8 C) (Oral)  Resp 18  Ht 5\' 4"  (1.626 m)  Wt 168 lb 2 oz (76.261 kg)  BMI 28.84 kg/m2  SpO2 93% Wt Readings from Last 3 Encounters:  09/14/15 168 lb 2 oz (76.261 kg)  08/28/15 164 lb 6 oz (74.56 kg)  08/18/15 176 lb 6.4 oz (80.015 kg)     Lab Results  Component Value Date   WBC 6.4 08/28/2015   HGB 11.2* 08/28/2015   HCT 33.9* 08/28/2015   PLT 323.0 08/28/2015   GLUCOSE 83 08/28/2015   CHOL 92 08/04/2015   TRIG 53 08/04/2015   HDL 51 08/04/2015   LDLDIRECT 73.7 08/20/2013   LDLCALC 30 08/04/2015   ALT 29 08/18/2015   AST 41 08/18/2015   NA 140 08/28/2015   K 3.9 08/28/2015   CL 100 08/28/2015   CREATININE 0.92 08/28/2015   BUN 7 08/28/2015   CO2 33* 08/28/2015   TSH 2.72 04/13/2015   INR 1.07 08/03/2015    Dg Chest 2 View  08/18/2015  CLINICAL DATA:  Weakness with nausea and vomiting EXAM: CHEST  2 VIEW COMPARISON:  Chest CT August 06, 2015; chest radiograph August 03, 2015 FINDINGS: There is extensive airspace consolidation throughout much of the right lung with involvement of portions of the right upper and middle lobes. Left lung is clear. Heart size and pulmonary vascular normal. No adenopathy. No bone lesions. IMPRESSION: Extensive airspace consolidation on the right, consistent with pneumonia. Pneumonia is primarily seen in the right middle and anterior segment right upper lobe regions. Left lung clear. No change in cardiac silhouette. Electronically Signed   By: Lowella Grip III M.D.   On: 08/18/2015 15:41       Assessment & Plan:   Problem List Items Addressed This Visit    Anemia    hgb improved on last check.  Follow.       CAP (community acquired pneumonia)    cxr with pneumonia.  Treated.  Completed abx.  Now no increased cough or congestion.  Breathing stable.  Feels better.  Has f/u with  pulmonary at the end of the month.  Will need f/u cxr at that time.        Diarrhea    Diarrhea as outlined.  Recent abx use.  Check stool for c.diff.  Once know that she has no infection, may need to restart colestid.        GERD (gastroesophageal reflux disease)    On nexium.  No upper symptoms reported.        History of colonic polyps    Colonoscopy 07/09/15 - tubular adenoma.        Hypercholesterolemia    On simvastatin.  Low cholesterol diet and exercise.  Follow lipid panel and liver function tests.        Hypertension - Primary    Blood pressure under good control.  Continue same medication regimen.  Follow pressures.  Follow metabolic panel.        Rheumatoid arthritis ([place])    She feels she is starting to "flare".  Request appt with Dr [name].        Weight loss    Eating.  Weight up a few pounds.  Follow.            [name], MD

## 2015-09-14 NOTE — Progress Notes (Signed)
Pre-visit discussion using our clinic review tool. No additional management support is needed unless otherwise documented below in the visit note.  

## 2015-09-15 ENCOUNTER — Other Ambulatory Visit: Payer: Self-pay | Admitting: *Deleted

## 2015-09-15 ENCOUNTER — Other Ambulatory Visit: Payer: Self-pay | Admitting: Internal Medicine

## 2015-09-15 DIAGNOSIS — R197 Diarrhea, unspecified: Secondary | ICD-10-CM

## 2015-09-15 MED ORDER — ALBUTEROL SULFATE HFA 108 (90 BASE) MCG/ACT IN AERS: 2.0000 | INHALATION_SPRAY | RESPIRATORY_TRACT | Status: DC | PRN

## 2015-09-15 NOTE — Assessment & Plan Note (Signed)
Blood pressure under good control.  Continue same medication regimen.  Follow pressures.  Follow metabolic panel.   

## 2015-09-15 NOTE — Assessment & Plan Note (Signed)
She feels she is starting to "flare".  Request appt with Dr [name].

## 2015-09-15 NOTE — Assessment & Plan Note (Signed)
On nexium.  No upper symptoms reported.   

## 2015-09-15 NOTE — Telephone Encounter (Signed)
Pt is requesting a 90 refill because her insurance will cover it that way instead of paying for one inhaler at a time. Please advise, thanks

## 2015-09-15 NOTE — Telephone Encounter (Signed)
rx sent in for albuterol inhaler #3 with 1 refill.

## 2015-09-15 NOTE — Assessment & Plan Note (Signed)
cxr with pneumonia.  Treated.  Completed abx.  Now no increased cough or congestion.  Breathing stable.  Feels better.  Has f/u with pulmonary at the end of the month.  Will need f/u cxr at that time.

## 2015-09-15 NOTE — Telephone Encounter (Signed)
Patient need a Rx for [facility] for a 90 day supply.

## 2015-09-15 NOTE — Progress Notes (Signed)
Order placed for c.diff.   

## 2015-09-15 NOTE — Assessment & Plan Note (Signed)
hgb improved on last check.  Follow.

## 2015-09-15 NOTE — Assessment & Plan Note (Signed)
Diarrhea as outlined.  Recent abx use.  Check stool for c.diff.  Once know that she has no infection, may need to restart colestid.

## 2015-09-15 NOTE — Telephone Encounter (Signed)
Notified pt. 

## 2015-09-15 NOTE — Assessment & Plan Note (Signed)
Eating.  Weight up a few pounds.  Follow.

## 2015-09-15 NOTE — Assessment & Plan Note (Signed)
Colonoscopy 07/09/15 - tubular adenoma.

## 2015-09-15 NOTE — Assessment & Plan Note (Signed)
On simvastatin.  Low cholesterol diet and exercise.  Follow lipid panel and liver function tests.   

## 2015-09-17 DIAGNOSIS — I110 Hypertensive heart disease with heart failure: Secondary | ICD-10-CM | POA: Diagnosis not present

## 2015-09-17 DIAGNOSIS — J471 Bronchiectasis with (acute) exacerbation: Secondary | ICD-10-CM | POA: Diagnosis not present

## 2015-09-17 DIAGNOSIS — I248 Other forms of acute ischemic heart disease: Secondary | ICD-10-CM | POA: Diagnosis not present

## 2015-09-17 DIAGNOSIS — I340 Nonrheumatic mitral (valve) insufficiency: Secondary | ICD-10-CM | POA: Diagnosis not present

## 2015-09-17 DIAGNOSIS — I5031 Acute diastolic (congestive) heart failure: Secondary | ICD-10-CM | POA: Diagnosis not present

## 2015-09-17 DIAGNOSIS — I361 Nonrheumatic tricuspid (valve) insufficiency: Secondary | ICD-10-CM | POA: Diagnosis not present

## 2015-09-21 ENCOUNTER — Other Ambulatory Visit: Payer: Self-pay

## 2015-09-21 MED ORDER — PRAMIPEXOLE DIHYDROCHLORIDE 0.25 MG PO TABS: 0.5000 mg | ORAL_TABLET | Freq: Two times a day (BID) | ORAL | Status: DC

## 2015-09-24 ENCOUNTER — Other Ambulatory Visit: Payer: Self-pay

## 2015-09-24 DIAGNOSIS — J471 Bronchiectasis with (acute) exacerbation: Secondary | ICD-10-CM | POA: Diagnosis not present

## 2015-09-24 DIAGNOSIS — I361 Nonrheumatic tricuspid (valve) insufficiency: Secondary | ICD-10-CM | POA: Diagnosis not present

## 2015-09-24 DIAGNOSIS — I248 Other forms of acute ischemic heart disease: Secondary | ICD-10-CM | POA: Diagnosis not present

## 2015-09-24 DIAGNOSIS — I110 Hypertensive heart disease with heart failure: Secondary | ICD-10-CM | POA: Diagnosis not present

## 2015-09-24 DIAGNOSIS — I340 Nonrheumatic mitral (valve) insufficiency: Secondary | ICD-10-CM | POA: Diagnosis not present

## 2015-09-24 DIAGNOSIS — I5031 Acute diastolic (congestive) heart failure: Secondary | ICD-10-CM | POA: Diagnosis not present

## 2015-09-24 MED ORDER — PRAMIPEXOLE DIHYDROCHLORIDE 0.25 MG PO TABS: 0.5000 mg | ORAL_TABLET | Freq: Two times a day (BID) | ORAL | Status: DC

## 2015-09-24 NOTE — Telephone Encounter (Signed)
Pharmacy states that first dukes mouthwash is on back order indefinetly, asked for a verbal order for regular dukes. Verbal order given

## 2015-09-25 ENCOUNTER — Other Ambulatory Visit: Payer: Self-pay | Admitting: Internal Medicine

## 2015-09-30 ENCOUNTER — Other Ambulatory Visit: Payer: Self-pay

## 2015-09-30 MED ORDER — NYSTATIN 100000 UNIT/GM EX CREA: 1.0000 "application " | TOPICAL_CREAM | Freq: Two times a day (BID) | CUTANEOUS | Status: DC

## 2015-09-30 NOTE — Telephone Encounter (Signed)
Pt states that she is taking her Xanax 1 a day at bedtime. Pt states that she did not need a refill and that the pharmacy was requesting it not her. Please advise, thanks

## 2015-09-30 NOTE — Telephone Encounter (Signed)
Pharmacy is requesting a refill on Xanax. Pt's last OV was 09/14/15, Please advise, thanks

## 2015-09-30 NOTE — Telephone Encounter (Signed)
Need to confirm how she is taking.  Per medication history, she received a rx for #90 with no refills on 09/15/15.  Only supposed to be taking one per day prn.  Should not need refill.

## 2015-10-06 ENCOUNTER — Encounter: Payer: Self-pay | Admitting: Pulmonary Disease

## 2015-10-06 ENCOUNTER — Ambulatory Visit (INDEPENDENT_AMBULATORY_CARE_PROVIDER_SITE_OTHER): Payer: Medicare Other | Admitting: Pulmonary Disease

## 2015-10-06 ENCOUNTER — Ambulatory Visit
Admission: RE | Admit: 2015-10-06 | Discharge: 2015-10-06 | Disposition: A | Discharge status: Home / Self Care | Payer: Medicare Other | Source: Ambulatory Visit | Attending: Pulmonary Disease | Admitting: Pulmonary Disease

## 2015-10-06 VITALS — BP 120/70 | HR 77 | Ht 64.0 in | Wt 170.0 lb

## 2015-10-06 DIAGNOSIS — R0600 Dyspnea, unspecified: Secondary | ICD-10-CM | POA: Diagnosis not present

## 2015-10-06 DIAGNOSIS — Z8701 Personal history of pneumonia (recurrent): Secondary | ICD-10-CM | POA: Diagnosis not present

## 2015-10-06 DIAGNOSIS — J189 Pneumonia, unspecified organism: Secondary | ICD-10-CM | POA: Diagnosis not present

## 2015-10-06 DIAGNOSIS — I208 Other forms of angina pectoris: Secondary | ICD-10-CM

## 2015-10-06 NOTE — Patient Instructions (Signed)
Chest Xray today - we will call you with the results Continue Advair as previously prescribed Follow up with me as needed

## 2015-10-07 ENCOUNTER — Telehealth: Payer: Self-pay | Admitting: Pulmonary Disease

## 2015-10-07 DIAGNOSIS — I361 Nonrheumatic tricuspid (valve) insufficiency: Secondary | ICD-10-CM | POA: Diagnosis not present

## 2015-10-07 DIAGNOSIS — I110 Hypertensive heart disease with heart failure: Secondary | ICD-10-CM | POA: Diagnosis not present

## 2015-10-07 DIAGNOSIS — J471 Bronchiectasis with (acute) exacerbation: Secondary | ICD-10-CM | POA: Diagnosis not present

## 2015-10-07 DIAGNOSIS — I340 Nonrheumatic mitral (valve) insufficiency: Secondary | ICD-10-CM | POA: Diagnosis not present

## 2015-10-07 DIAGNOSIS — I5031 Acute diastolic (congestive) heart failure: Secondary | ICD-10-CM | POA: Diagnosis not present

## 2015-10-07 DIAGNOSIS — I248 Other forms of acute ischemic heart disease: Secondary | ICD-10-CM | POA: Diagnosis not present

## 2015-10-07 NOTE — Telephone Encounter (Signed)
CXR is normal  Merton Border, MD [facility] [phone] Pager [phone]

## 2015-10-07 NOTE — Telephone Encounter (Signed)
Pt is requesting results from her CXR on 10/06/15.  Dr. Alva Garnet - please advise. Thanks.

## 2015-10-07 NOTE — Telephone Encounter (Signed)
Patient wants x ray results . Please call.

## 2015-10-08 ENCOUNTER — Other Ambulatory Visit: Payer: Self-pay | Admitting: *Deleted

## 2015-10-08 DIAGNOSIS — R197 Diarrhea, unspecified: Secondary | ICD-10-CM

## 2015-10-08 NOTE — Telephone Encounter (Signed)
I spoke with patient about results and she verbalized understanding and had no questions 

## 2015-10-09 ENCOUNTER — Telehealth: Payer: Self-pay | Admitting: *Deleted

## 2015-10-09 LAB — C. DIFFICILE GDH AND TOXIN A/B
C. difficile GDH: NOT DETECTED
C. difficile Toxin A/B: NOT DETECTED

## 2015-10-09 NOTE — Telephone Encounter (Signed)
Ok to f/u with Dr [name].  I would recommend her see him for evaluation prior to infusion.

## 2015-10-09 NOTE — Telephone Encounter (Signed)
Pt.notified

## 2015-10-09 NOTE — Telephone Encounter (Signed)
Pt wants to know is she needs to go ahead & schedule appt with Dr. [name] for her RA since x-ray was normal.

## 2015-10-12 NOTE — Progress Notes (Signed)
PULMONARY CONSULT NOTE  Requesting MD/Service: [name], MD Date of initial consultation: 10/06/15 Reason for consultation: Recurrent PNAs   HPI:  17 F referred for evaluation of recurrent PNAs. She has been hospitalized twice in past 3 months, most recently 02/07-02/11/17. There was no organism identified but she had a definite infiltrate on the R on CXR. Previously, she was diagnosed with PNA in late January when a CT chest revealed minimal lingular atelectasis or infiltrate. On the day of this evaluation, she states that she feels "wonderful". She has no cough or sputum production. She denies fever and chest pain. Her exercise tolerance is at her baseline. She has previously been diagnosed with COPD and is on Advair and tiotropium but she has never smoked and never had PFTs performed.   Past Medical History  Diagnosis Date  . Rheumatoid arthritis(714.0)     MTX transaminitis, Leflunomide (rash), enbrel, plaquinil, prednisone, remicade, Imuran (transaminitis)  . Hyperlipidemia   . Diverticulitis   . Osteoarthritis   . GERD (gastroesophageal reflux disease)   . Hypertension   . Anemia   . Positive PPD     s/p INH (2006)  . Osteoporosis     actonel  . 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 Abdominal hysterectomy    . Tracheostomy  1959  . Tubal ligation    . Cervical cone biopsy    . Ovary surgery    . Cholecystectomy  06/22/14  . Colonoscopy with propofol N/A 07/09/2015    Procedure: COLONOSCOPY WITH PROPOFOL;  Surgeon: Manya Silvas, MD;  Location: [facility] ENDOSCOPY;  Service: Endoscopy;  Laterality: N/A;  . Esophagogastroduodenoscopy (egd) with propofol N/A 07/09/2015    Procedure: ESOPHAGOGASTRODUODENOSCOPY (EGD) WITH PROPOFOL;  Surgeon: Manya Silvas, MD;  Location: [facility] ENDOSCOPY;  Service: Endoscopy;  Laterality: N/A;    MEDICATIONS: I have reviewed all medications and confirmed regimen as  documented  Social History   Social History  . Marital Status: Widowed    Spouse Name: N/A  . Number of Children: 4  . Years of Education: N/A   Occupational History  . retired    Social History Main Topics  . Smoking status: Never Smoker   . Smokeless tobacco: Never Used  . Alcohol Use: No  . Drug Use: No  . Sexual Activity: Not on file   Other Topics Concern  . Not on file   Social History Narrative   Lives with family at home    Family History  Problem Relation Age of Onset  . Heart disease Father     MI  . Heart disease Mother   . Valvular heart disease Mother   . Colon cancer Neg Hx   . Breast cancer Sister 50    ROS: No fever, myalgias/arthralgias, unexplained weight loss or weight gain No new focal weakness or sensory deficits No otalgia, hearing loss, visual changes, nasal and sinus symptoms, mouth and throat problems No neck pain or adenopathy No abdominal pain, N/V/D, diarrhea, change in bowel pattern No dysuria, change in urinary pattern No LE edema or calf tenderness   Filed Vitals:   10/06/15 1117  BP: 120/70  Pulse: 77  Height: 5\' 4"  (1.626 m)  Weight: 170 lb (77.111 kg)  SpO2: 100%     EXAM:  Gen: WDWN, No overt respiratory distress HEENT: NCAT, TMs and canals normal, sclera white, nares and nasal mucosa normal, oropharynx normal Neck:  Supple without LAN, thyromegaly, JVD Lungs: breath sounds full, percussion note normal throughout, No adventitious sounds Cardiovascular: Normal rate, reg rhythm, no murmurs noted Abdomen: Soft, nontender, normal BS Ext: without clubbing, cyanosis, edema Neuro: CNs grossly intact, motor and sensory intact, DTRs symmetric Skin: Limited exam, no lesions noted  DATA:   BMP Latest Ref Rng 08/28/2015 08/20/2015 08/19/2015  Glucose 70 - 99 mg/dL 83 120(H) 102(H)  BUN 6 - 23 mg/dL 7 14 17   Creatinine 0.40 - 1.20 mg/dL 0.92 0.79 0.89  Sodium 135 - 145 mEq/L 140 134(L) 135  Potassium 3.5 - 5.1 mEq/L 3.9 4.4  3.9  Chloride 96 - 112 mEq/L 100 102 104  CO2 19 - 32 mEq/L 33(H) 29 25  Calcium 8.4 - 10.5 mg/dL 9.3 8.5(L) 7.4(L)    CBC Latest Ref Rng 08/28/2015 08/20/2015 08/19/2015  WBC 4.0 - 10.5 K/uL 6.4 10.7 18.6(H)  Hemoglobin 12.0 - 15.0 g/dL 11.2(L) 9.0(L) 9.4(L)  Hematocrit 36.0 - 46.0 % 33.9(L) 26.3(L) 28.0(L)  Platelets 150.0 - 400.0 K/uL 323.0 145(L) 163    CXR:  10/06/15 entirely normal  IMPRESSION:     ICD-9-CM ICD-10-CM   1. Dyspnea 786.09 R06.00   2. History of pneumonia, recurrent V12.61 Z87.01 DG Chest 2 View  3. 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 Reported history of COPD without prior smoking or PFTs to confirm - CXR and CT chest do not provide evidence to support this diagnosis. She believes that Advair helps her chronic dyspnea  It is not clear that she actually had 2 separate pneumonias but the CXR on [id number] was certainly consistent with pneumonia. It is likely that she is at some increased risk due to therapy for RA but I doubt she is inordinately susceptible to pneumonias and there is probably not much, other than ensuring the usual schedule of vaccinations, that can mitigate this risk in the future  PLAN:  No further interventions or evaluations recommended at this time Follow up as needed   Kaitlyn Mcardle, MD [facility], [place]

## 2015-10-19 DIAGNOSIS — J40 Bronchitis, not specified as acute or chronic: Secondary | ICD-10-CM | POA: Diagnosis not present

## 2015-10-19 DIAGNOSIS — M0579 Rheumatoid arthritis with rheumatoid factor of multiple sites without organ or systems involvement: Secondary | ICD-10-CM | POA: Diagnosis not present

## 2015-10-19 DIAGNOSIS — R252 Cramp and spasm: Secondary | ICD-10-CM | POA: Diagnosis not present

## 2015-10-19 DIAGNOSIS — M542 Cervicalgia: Secondary | ICD-10-CM | POA: Diagnosis not present

## 2015-10-22 ENCOUNTER — Other Ambulatory Visit: Payer: Self-pay

## 2015-10-22 NOTE — Telephone Encounter (Signed)
Please advise refill? thanks 

## 2015-10-24 NOTE — Telephone Encounter (Signed)
Need to clarify how often she is taking xanax.  I gave her #90 on 08/18/15.  Is she asking early because of mail order?

## 2015-10-26 MED ORDER — ALPRAZOLAM 0.25 MG PO TABS: 0.2500 mg | ORAL_TABLET | Freq: Every day | ORAL | Status: DC | PRN

## 2015-10-26 NOTE — Telephone Encounter (Signed)
Yes Mail order change. thanks

## 2015-10-26 NOTE — Telephone Encounter (Signed)
rx ok'd for xanax #90 with no refills.   

## 2015-11-05 DIAGNOSIS — Z79899 Other long term (current) drug therapy: Secondary | ICD-10-CM | POA: Diagnosis not present

## 2015-11-05 DIAGNOSIS — H3530 Unspecified macular degeneration: Secondary | ICD-10-CM | POA: Diagnosis not present

## 2015-11-16 ENCOUNTER — Other Ambulatory Visit: Payer: Self-pay | Admitting: Internal Medicine

## 2015-11-16 ENCOUNTER — Telehealth: Payer: Self-pay | Admitting: Internal Medicine

## 2015-11-16 NOTE — Telephone Encounter (Signed)
Need to confirm with mail order.  If did not receive then will resend.

## 2015-11-16 NOTE — Telephone Encounter (Signed)
Medication last refilled 10/26/2015. Patient last visit 09/14/15. Please advise?

## 2015-11-16 NOTE — Telephone Encounter (Signed)
She was given an rx for #90 xanax in 10/2105.  Should not need refill now.

## 2015-11-16 NOTE — Telephone Encounter (Signed)
ALPRAZolam (XANAX) 0.25 MG tablet

## 2015-11-16 NOTE — Telephone Encounter (Signed)
Refill request sent to Dr Scott. 

## 2015-11-16 NOTE — Telephone Encounter (Signed)
Pt stated that mail order never received the faxed prescription.

## 2015-11-17 NOTE — Telephone Encounter (Signed)
Prescription was [facility].

## 2015-11-24 ENCOUNTER — Other Ambulatory Visit: Payer: Self-pay | Admitting: Internal Medicine

## 2015-12-03 DIAGNOSIS — M0579 Rheumatoid arthritis with rheumatoid factor of multiple sites without organ or systems involvement: Secondary | ICD-10-CM | POA: Diagnosis not present

## 2015-12-03 DIAGNOSIS — M25473 Effusion, unspecified ankle: Secondary | ICD-10-CM | POA: Diagnosis not present

## 2015-12-08 ENCOUNTER — Other Ambulatory Visit: Payer: Self-pay | Admitting: Internal Medicine

## 2015-12-11 ENCOUNTER — Other Ambulatory Visit: Payer: Self-pay

## 2015-12-11 MED ORDER — FUROSEMIDE 20 MG PO TABS: 20.0000 mg | ORAL_TABLET | Freq: Every day | ORAL | Status: DC

## 2015-12-14 DIAGNOSIS — I059 Rheumatic mitral valve disease, unspecified: Secondary | ICD-10-CM | POA: Diagnosis not present

## 2015-12-14 DIAGNOSIS — E7800 Pure hypercholesterolemia, unspecified: Secondary | ICD-10-CM | POA: Diagnosis not present

## 2015-12-14 DIAGNOSIS — J9601 Acute respiratory failure with hypoxia: Secondary | ICD-10-CM | POA: Diagnosis not present

## 2015-12-14 DIAGNOSIS — R0602 Shortness of breath: Secondary | ICD-10-CM | POA: Diagnosis not present

## 2015-12-14 DIAGNOSIS — M0579 Rheumatoid arthritis with rheumatoid factor of multiple sites without organ or systems involvement: Secondary | ICD-10-CM | POA: Diagnosis not present

## 2015-12-14 DIAGNOSIS — I10 Essential (primary) hypertension: Secondary | ICD-10-CM | POA: Diagnosis not present

## 2015-12-14 DIAGNOSIS — E782 Mixed hyperlipidemia: Secondary | ICD-10-CM | POA: Diagnosis not present

## 2015-12-17 ENCOUNTER — Telehealth: Payer: Self-pay | Admitting: *Deleted

## 2015-12-17 NOTE — Telephone Encounter (Signed)
She needs to clarify with cardiology the dose she is supposed to be on.  The last note they sent me had 20mg  q day.  Did they increase the dose?

## 2015-12-17 NOTE — Telephone Encounter (Signed)
[facility] asking for clarification:  Dr Nicki Reaper ordered: furosemide (LASIX) 20 MG tablet 1 tab daily [facility] ordered: furosemide 40 mg tablet 1 tab daily  Please advise

## 2015-12-17 NOTE — Telephone Encounter (Signed)
Patient was seen by Dr [name] and lasix was increased to 40 mg  For a short time due to edema.  Patient has received 90 day supply and does not need a refill at this time.

## 2015-12-18 ENCOUNTER — Encounter: Payer: Self-pay | Admitting: Internal Medicine

## 2015-12-18 ENCOUNTER — Ambulatory Visit (INDEPENDENT_AMBULATORY_CARE_PROVIDER_SITE_OTHER): Payer: Medicare Other | Admitting: Internal Medicine

## 2015-12-18 VITALS — BP 110/70 | HR 65 | Temp 98.5°F | Resp 18 | Ht 64.0 in | Wt 175.0 lb

## 2015-12-18 DIAGNOSIS — Z1239 Encounter for other screening for malignant neoplasm of breast: Secondary | ICD-10-CM

## 2015-12-18 DIAGNOSIS — I208 Other forms of angina pectoris: Secondary | ICD-10-CM

## 2015-12-18 DIAGNOSIS — R197 Diarrhea, unspecified: Secondary | ICD-10-CM

## 2015-12-18 DIAGNOSIS — E7800 Pure hypercholesterolemia, unspecified: Secondary | ICD-10-CM | POA: Diagnosis not present

## 2015-12-18 DIAGNOSIS — R600 Localized edema: Secondary | ICD-10-CM

## 2015-12-18 DIAGNOSIS — M069 Rheumatoid arthritis, unspecified: Secondary | ICD-10-CM | POA: Diagnosis not present

## 2015-12-18 DIAGNOSIS — D649 Anemia, unspecified: Secondary | ICD-10-CM

## 2015-12-18 DIAGNOSIS — R609 Edema, unspecified: Secondary | ICD-10-CM | POA: Diagnosis not present

## 2015-12-18 DIAGNOSIS — I10 Essential (primary) hypertension: Secondary | ICD-10-CM

## 2015-12-18 DIAGNOSIS — R0902 Hypoxemia: Secondary | ICD-10-CM

## 2015-12-18 LAB — BASIC METABOLIC PANEL
BUN: 11 mg/dL (ref 7–25)
CHLORIDE: 99 mmol/L (ref 98–110)
CO2: 28 mmol/L (ref 20–31)
Calcium: 9.2 mg/dL (ref 8.6–10.4)
Creat: 1.35 mg/dL — ABNORMAL HIGH (ref 0.60–0.93)
Glucose, Bld: 119 mg/dL — ABNORMAL HIGH (ref 65–99)
POTASSIUM: 4.1 mmol/L (ref 3.5–5.3)
SODIUM: 139 mmol/L (ref 135–146)

## 2015-12-18 LAB — CBC WITH DIFFERENTIAL/PLATELET
BASOS PCT: 0 %
Basophils Absolute: 0 cells/uL (ref 0–200)
EOS ABS: 134 {cells}/uL (ref 15–500)
Eosinophils Relative: 2 %
HCT: 35.1 % (ref 35.0–45.0)
Hemoglobin: 11.8 g/dL (ref 11.7–15.5)
LYMPHS PCT: 31 %
Lymphs Abs: 2077 cells/uL (ref 850–3900)
MCH: 31 pg (ref 27.0–33.0)
MCHC: 33.6 g/dL (ref 32.0–36.0)
MCV: 92.1 fL (ref 80.0–100.0)
MONOS PCT: 7 %
MPV: 10.3 fL (ref 7.5–12.5)
Monocytes Absolute: 469 cells/uL (ref 200–950)
NEUTROS ABS: 4020 {cells}/uL (ref 1500–7800)
Neutrophils Relative %: 60 %
PLATELETS: 267 10*3/uL (ref 140–400)
RBC: 3.81 MIL/uL (ref 3.80–5.10)
RDW: 14.2 % (ref 11.0–15.0)
WBC: 6.7 10*3/uL (ref 3.8–10.8)

## 2015-12-18 LAB — TSH: TSH: 1.18 mIU/L

## 2015-12-18 NOTE — Progress Notes (Signed)
Pre-visit discussion using our clinic review tool. No additional management support is needed unless otherwise documented below in the visit note.  

## 2015-12-18 NOTE — Progress Notes (Signed)
Patient ID: Kaitlyn Huff, female   DOB: 1941/01/21, 75 y.o.   MRN: [id number]   Subjective:    Patient ID: Kaitlyn Huff, female    DOB: Aug 11, 1940, 75 y.o.   MRN: [id number]  HPI  Patient here for a scheduled follow up.  She is feeling better. Swelling better.   Saw cardiology.  See cardiology note.  Planning for echo.  Lasix increased to 40mg  q day.  Leg swelling is better now.  No sob.  Some minimal persistent cough.  No chest congestion.  Cough is better.  She is eating.  No acid reflux.  No abdominal pain or cramping.  Bowels better.  No diarrhea.  Due to see vascular surgery next week.  She had questions about the need for continued use of nocturnal oxygen.     Past Medical History  Diagnosis Date  . Rheumatoid arthritis(714.0)     MTX transaminitis, Leflunomide (rash), enbrel, plaquinil, prednisone, remicade, Imuran (transaminitis)  . Hyperlipidemia   . Diverticulitis   . Osteoarthritis   . GERD (gastroesophageal reflux disease)   . Hypertension   . Anemia   . Positive PPD     s/p INH (2006)  . Osteoporosis     actonel  . 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 Abdominal hysterectomy    . Tracheostomy  1959  . Tubal ligation    . Cervical cone biopsy    . Ovary surgery    . Cholecystectomy  06/22/14  . Colonoscopy with propofol N/A 07/09/2015    Procedure: COLONOSCOPY WITH PROPOFOL;  Surgeon: Manya Silvas, MD;  Location: [facility] ENDOSCOPY;  Service: Endoscopy;  Laterality: N/A;  . Esophagogastroduodenoscopy (egd) with propofol N/A 07/09/2015    Procedure: ESOPHAGOGASTRODUODENOSCOPY (EGD) WITH PROPOFOL;  Surgeon: Manya Silvas, MD;  Location: [facility] ENDOSCOPY;  Service: Endoscopy;  Laterality: N/A;   Family History  Problem Relation Age of Onset  . Heart disease Father     MI  . Heart disease Mother   . Valvular heart disease Mother   . Colon cancer Neg Hx   . Breast cancer Sister 3   Social  History   Social History  . Marital Status: Widowed    Spouse Name: N/A  . Number of Children: 4  . Years of Education: N/A   Occupational History  . retired    Social History Main Topics  . Smoking status: Never Smoker   . Smokeless tobacco: Never Used  . Alcohol Use: No  . Drug Use: No  . Sexual Activity: Not Asked   Other Topics Concern  . None   Social History Narrative   Lives with family at home    Outpatient Encounter Prescriptions as of 12/18/2015  Medication Sig  . acidophilus (RISAQUAD) CAPS capsule Take 1 capsule by mouth daily.  Marland Kitchen albuterol (PROVENTIL HFA;VENTOLIN HFA) 108 (90 Base) MCG/ACT inhaler Inhale 2 puffs into the lungs every 4 (four) hours as needed for wheezing or shortness of breath.  Marland Kitchen albuterol (PROVENTIL) (2.5 MG/3ML) 0.083% nebulizer solution Take 2.5 mg by nebulization every 6 (six) hours as needed for wheezing or shortness of breath.  Marland Kitchen alendronate (FOSAMAX) 70 MG tablet Take by mouth.  . ALPRAZolam (XANAX) 0.25 MG tablet Take 1 tablet (0.25 mg total) by mouth daily as needed for anxiety.  Marland Kitchen aspirin EC 81 MG tablet Take 81 mg by mouth daily.  . Calcium Carbonate-Vitamin D (CALCIUM  600+D) 600-400 MG-UNIT tablet Take 1 tablet by mouth 2 (two) times daily.  . cefpodoxime (VANTIN) 200 MG tablet Take 1 tablet (200 mg total) by mouth 2 (two) times daily.  . cetirizine (ZYRTEC) 10 MG tablet Take 10 mg by mouth daily.  . chlorpheniramine-HYDROcodone (TUSSIONEX) 10-8 MG/5ML SUER Take 5 mLs by mouth every 12 (twelve) hours as needed for cough.  . cholecalciferol (VITAMIN D) 1000 units tablet Take 1,000 Units by mouth daily.  . colestipol (COLESTID) 1 g tablet Take 2 g by mouth 2 (two) times daily.  . Diphenhyd-Hydrocort-Nystatin (FIRST-DUKES MOUTHWASH) SUSP Pt is to swish and spit 5 cc's tid  . esomeprazole (NEXIUM) 40 MG capsule Take 40 mg by mouth 2 (two) times daily before a meal.  . FeFum-FePo-FA-B Cmp-C-Zn-Mn-Cu (TANDEM PLUS) 162-115.2-1 MG CAPS Take 1  capsule by mouth daily.  . Fluticasone-Salmeterol (ADVAIR) 250-50 MCG/DOSE AEPB Inhale 1 puff into the lungs 2 (two) times daily.  . furosemide (LASIX) 20 MG tablet Take 1 tablet (20 mg total) by mouth daily.  . furosemide (LASIX) 40 MG tablet Take 40 mg by mouth. Dr [name]  . gabapentin (NEURONTIN) 300 MG capsule Take 300 mg by mouth 4 (four) times daily.  Marland Kitchen guaiFENesin-dextromethorphan (ROBITUSSIN DM) 100-10 MG/5ML syrup Take 5 mLs by mouth every 4 (four) hours as needed for cough.  . hydroxychloroquine (PLAQUENIL) 200 MG tablet Take 1 tablet (200 mg total) by mouth 2 (two) times daily.  . Multiple Vitamin (MULTIVITAMIN WITH MINERALS) TABS tablet Take 1 tablet by mouth daily.  Marland Kitchen nystatin cream (MYCOSTATIN) Apply 1 application topically 2 (two) times daily. Apply to affected area (outer vagina)  . potassium chloride (MICRO-K) 10 MEQ CR capsule Take 20 mEq by mouth 2 (two) times daily.  . pramipexole (MIRAPEX) 0.25 MG tablet Take 2 tablets (0.5 mg total) by mouth 2 (two) times daily.  . simvastatin (ZOCOR) 20 MG tablet TAKE 1 TABLET DAILY  . sodium chloride (OCEAN) 0.65 % nasal spray Place 1 spray into the nose as needed for congestion.  . TOPROL XL 50 MG 24 hr tablet TAKE 1 TABLET DAILY. TAKE WITH OR IMMEDIATELY FOLLOWING A MEAL  . traMADol (ULTRAM) 50 MG tablet Take 50 mg by mouth 2 (two) times daily as needed for moderate pain.  . [DISCONTINUED] guaiFENesin (MUCINEX) 600 MG 12 hr tablet Take 1 tablet (600 mg total) by mouth 2 (two) times daily.   No facility-administered encounter medications on file as of 12/18/2015.    Review of Systems  Constitutional: Negative for appetite change and unexpected weight change.  HENT: Negative for congestion and sinus pressure.   Respiratory: Positive for cough (minimal). Negative for chest tightness and shortness of breath.   Cardiovascular: Positive for leg swelling (improved now. ). Negative for chest pain and palpitations.  Gastrointestinal:  Negative for nausea, vomiting, abdominal pain and diarrhea.  Genitourinary: Negative for dysuria and difficulty urinating.  Musculoskeletal: Negative for myalgias and back pain.  Skin: Negative for color change and rash.  Neurological: Negative for dizziness, light-headedness and headaches.  Psychiatric/Behavioral: Negative for dysphoric mood and agitation.       Objective:    Physical Exam  Constitutional: She appears well-developed and well-nourished. No distress.  HENT:  Nose: Nose normal.  Mouth/Throat: Oropharynx is clear and moist.  Neck: Neck supple. No thyromegaly present.  Cardiovascular: Normal rate and regular rhythm.   Pulmonary/Chest: Breath sounds normal. No respiratory distress. She has no wheezes.  Abdominal: Soft. Bowel sounds are normal. There is no  tenderness.  Musculoskeletal: She exhibits no tenderness.  Swelling improved.  No increased edema today.   Lymphadenopathy:    She has no cervical adenopathy.  Skin: No rash noted. No erythema.  Psychiatric: She has a normal mood and affect. Her behavior is normal.    BP 110/70 mmHg  Pulse 65  Temp(Src) 98.5 F (36.9 C) (Oral)  Resp 18  Ht 5\' 4"  (1.626 m)  Wt 175 lb (79.379 kg)  BMI 30.02 kg/m2  SpO2 94% Wt Readings from Last 3 Encounters:  12/18/15 175 lb (79.379 kg)  10/06/15 170 lb (77.111 kg)  09/14/15 168 lb 2 oz (76.261 kg)     Lab Results  Component Value Date   WBC 6.7 12/18/2015   HGB 11.8 12/18/2015   HCT 35.1 12/18/2015   PLT 267 12/18/2015   GLUCOSE 119* 12/18/2015   CHOL 92 08/04/2015   TRIG 53 08/04/2015   HDL 51 08/04/2015   LDLDIRECT 73.7 08/20/2013   LDLCALC 30 08/04/2015   ALT 29 08/18/2015   AST 41 08/18/2015   NA 139 12/18/2015   K 4.1 12/18/2015   CL 99 12/18/2015   CREATININE 1.35* 12/18/2015   BUN 11 12/18/2015   CO2 28 12/18/2015   TSH 1.18 12/18/2015   INR 1.07 08/03/2015    Dg Chest 2 View  10/06/2015  CLINICAL DATA:  Multiple episodes of pneumonia recently.  EXAM: CHEST  2 VIEW COMPARISON:  August 18, 2015 FINDINGS: The heart size and mediastinal contours are stable. The heart size is enlarged. There is no focal infiltrate, pulmonary edema, or pleural effusion. The visualized skeletal structures are unremarkable. IMPRESSION: No active cardiopulmonary disease. Electronically Signed   By: Abelardo Diesel M.D.   On: 10/06/2015 13:00       Assessment & Plan:   Problem List Items Addressed This Visit    Anemia    Follow cbc.       Diarrhea    Bowels better.  Follow.       Hypercholesterolemia    On simvastatin.  Low cholesterol diet and exercise.  Follow lipid panel and liver function tests.        Hypertension    Blood pressure as outlined.  May need to decrease lasix.  Swelling better.  Check metabolic panel.  Plan to decrease (or possibly hold temporarily) pending results.        Hypoxia    Requires nocturnal oxygen.  Was questioning if still needed O2.  Schedule for overnight oximetry study.        Relevant Orders   Pulse oximetry, overnight   Lower extremity edema - Primary    Swelling improved.  On lasix.  Check metabolic panel.  Decrease lasix.  May need to hold temporarily pending renal function, etc.  Keep appt with vascular surgery.       Relevant Orders   CBC with Differential/Platelet (Completed)   Basic metabolic panel (Completed)   TSH (Completed)   Rheumatoid arthritis (HCC)    Seeing Dr [name].  Just evaluated.         Other Visit Diagnoses    Screening breast examination        Relevant Orders    MM DIGITAL SCREENING BILATERAL        [name], MD

## 2015-12-20 ENCOUNTER — Encounter: Payer: Self-pay | Admitting: Internal Medicine

## 2015-12-20 DIAGNOSIS — R0902 Hypoxemia: Secondary | ICD-10-CM | POA: Insufficient documentation

## 2015-12-20 NOTE — Assessment & Plan Note (Signed)
Requires nocturnal oxygen.  Was questioning if still needed O2.  Schedule for overnight oximetry study.

## 2015-12-20 NOTE — Assessment & Plan Note (Signed)
Seeing Dr [name].  Just evaluated.

## 2015-12-20 NOTE — Assessment & Plan Note (Signed)
Follow cbc.  

## 2015-12-20 NOTE — Assessment & Plan Note (Signed)
Swelling improved.  On lasix.  Check metabolic panel.  Decrease lasix.  May need to hold temporarily pending renal function, etc.  Keep appt with vascular surgery.

## 2015-12-20 NOTE — Assessment & Plan Note (Signed)
Blood pressure as outlined.  May need to decrease lasix.  Swelling better.  Check metabolic panel.  Plan to decrease (or possibly hold temporarily) pending results.

## 2015-12-20 NOTE — Assessment & Plan Note (Signed)
Bowels better.  Follow.   

## 2015-12-20 NOTE — Assessment & Plan Note (Signed)
On simvastatin.  Low cholesterol diet and exercise.  Follow lipid panel and liver function tests.   

## 2015-12-21 DIAGNOSIS — M79609 Pain in unspecified limb: Secondary | ICD-10-CM | POA: Diagnosis not present

## 2015-12-21 DIAGNOSIS — I872 Venous insufficiency (chronic) (peripheral): Secondary | ICD-10-CM | POA: Diagnosis not present

## 2015-12-21 DIAGNOSIS — I890 Lymphedema, not elsewhere classified: Secondary | ICD-10-CM | POA: Diagnosis not present

## 2015-12-21 DIAGNOSIS — I8312 Varicose veins of left lower extremity with inflammation: Secondary | ICD-10-CM | POA: Diagnosis not present

## 2015-12-21 DIAGNOSIS — I8393 Asymptomatic varicose veins of bilateral lower extremities: Secondary | ICD-10-CM | POA: Diagnosis not present

## 2015-12-21 DIAGNOSIS — I8311 Varicose veins of right lower extremity with inflammation: Secondary | ICD-10-CM | POA: Diagnosis not present

## 2015-12-21 DIAGNOSIS — M7989 Other specified soft tissue disorders: Secondary | ICD-10-CM | POA: Diagnosis not present

## 2015-12-25 DIAGNOSIS — R0602 Shortness of breath: Secondary | ICD-10-CM | POA: Diagnosis not present

## 2016-01-06 DIAGNOSIS — R1310 Dysphagia, unspecified: Secondary | ICD-10-CM | POA: Diagnosis not present

## 2016-01-06 DIAGNOSIS — D105 Benign neoplasm of other parts of oropharynx: Secondary | ICD-10-CM | POA: Diagnosis not present

## 2016-01-18 ENCOUNTER — Ambulatory Visit: Payer: Medicare Other

## 2016-01-27 ENCOUNTER — Encounter: Payer: Self-pay | Admitting: Family Medicine

## 2016-01-27 ENCOUNTER — Ambulatory Visit (INDEPENDENT_AMBULATORY_CARE_PROVIDER_SITE_OTHER): Payer: Medicare Other

## 2016-01-27 ENCOUNTER — Ambulatory Visit (INDEPENDENT_AMBULATORY_CARE_PROVIDER_SITE_OTHER): Payer: Medicare Other | Admitting: Family Medicine

## 2016-01-27 VITALS — BP 118/64 | HR 64 | Temp 98.1°F | Ht 64.0 in | Wt 173.6 lb

## 2016-01-27 DIAGNOSIS — I208 Other forms of angina pectoris: Secondary | ICD-10-CM | POA: Diagnosis not present

## 2016-01-27 DIAGNOSIS — R05 Cough: Secondary | ICD-10-CM

## 2016-01-27 DIAGNOSIS — R059 Cough, unspecified: Secondary | ICD-10-CM | POA: Insufficient documentation

## 2016-01-27 MED ORDER — FLUTICASONE PROPIONATE 50 MCG/ACT NA SUSP: 2.0000 | Freq: Every day | NASAL | Status: DC

## 2016-01-27 NOTE — Assessment & Plan Note (Signed)
Patient with persistent cough in the morning with chest congestion in the morning since having pneumonia several months ago. No shortness of breath or chest pain. Suspect this is likely related to upper respiratory symptoms due to allergic rhinitis. Lung exam is benign. Vital signs are stable. Doubt pneumonia though given persistent symptoms we will obtain a chest x-ray to evaluate further. If chest x-ray is clear she will proceed with treatment of her upper respiratory symptoms with Flonase. She will continue to monitor. She's given return precautions.

## 2016-01-27 NOTE — Progress Notes (Signed)
Pre visit review using our clinic review tool, if applicable. No additional management support is needed unless otherwise documented below in the visit note. 

## 2016-01-27 NOTE — Patient Instructions (Signed)
Nice to see you. We will obtain a chest x-ray to evaluate your congestion and cough further. We will start you on Flonase to see if this will help with your upper respiratory symptoms. If you develop chest pain, shortness of breath, increased oxygen usage, or any new or changing symptoms please seek medical attention.

## 2016-01-27 NOTE — Progress Notes (Signed)
  Tommi Rumps, MD Phone: [phone]  Kaitlyn Huff is a 75 y.o. female who presents today for same-day visit.  Patient notes over the last several months she has had a persistent cough since being treated for pneumonia on 2 occasions. Notes in the morning she gets congestion in her chest and a mild tightness that resolves after she gets up and moves around. Cough in the morning is productive. No fevers. Notes stable shortness of breath. Wears oxygen at night and has not had increase her O2. No orthopnea. No exertional chest pain. No chest pain at rest. She notes some upper airway congestion and postnasal drip. Some reflux though notes her reflux is pretty well controlled. Notes the symptoms have been persistent. She wants to make sure she does not have recurrent pneumonia.   ROS see history of present illness  Objective  Physical Exam Filed Vitals:   01/27/16 1500  BP: 118/64  Pulse: 64  Temp: 98.1 F (36.7 C)    BP Readings from Last 3 Encounters:  01/27/16 118/64  12/18/15 110/70  10/06/15 120/70   Wt Readings from Last 3 Encounters:  01/27/16 173 lb 9.6 oz (78.744 kg)  12/18/15 175 lb (79.379 kg)  10/06/15 170 lb (77.111 kg)    Physical Exam  Constitutional: No distress.  HENT:  Head: Normocephalic and atraumatic.  Right Ear: External ear normal.  Left Ear: External ear normal.  Mouth/Throat: Oropharynx is clear and moist. No oropharyngeal exudate.  Normal TMs bilaterally  Eyes: Conjunctivae are normal. Pupils are equal, round, and reactive to light.  Neck: Neck supple.  Cardiovascular: Normal rate, regular rhythm and normal heart sounds.   Pulmonary/Chest: Effort normal and breath sounds normal.  Lymphadenopathy:    She has no cervical adenopathy.  Neurological: She is alert. Gait normal.  Skin: Skin is warm and dry. She is not diaphoretic.     Assessment/Plan: Please see individual problem list.  Cough Patient with persistent cough in the morning with  chest congestion in the morning since having pneumonia several months ago. No shortness of breath or chest pain. Suspect this is likely related to upper respiratory symptoms due to allergic rhinitis. Lung exam is benign. Vital signs are stable. Doubt pneumonia though given persistent symptoms we will obtain a chest x-ray to evaluate further. If chest x-ray is clear she will proceed with treatment of her upper respiratory symptoms with Flonase. She will continue to monitor. She's given return precautions.    Orders Placed This Encounter  Procedures  . DG Chest 2 View    Standing Status: Future     Number of Occurrences: 1     Standing Expiration Date: 03/29/2017    Order Specific Question:  Reason for Exam (SYMPTOM  OR DIAGNOSIS REQUIRED)    Answer:  persistent cough after treatment for pneumonia    Order Specific Question:  Preferred imaging location?    Answer:  [facility] ordered this encounter  Medications  . DISCONTD: fluticasone (FLONASE) 50 MCG/ACT nasal spray    Sig: Place 2 sprays into both nostrils daily.    Dispense:  16 g    Refill:  6  . fluticasone (FLONASE) 50 MCG/ACT nasal spray    Sig: Place 2 sprays into both nostrils daily.    Dispense:  16 g    Refill:  [place], MD [place]

## 2016-02-02 ENCOUNTER — Ambulatory Visit: Payer: Medicare Other

## 2016-02-03 ENCOUNTER — Other Ambulatory Visit: Payer: Self-pay

## 2016-02-03 DIAGNOSIS — I10 Essential (primary) hypertension: Secondary | ICD-10-CM | POA: Diagnosis not present

## 2016-02-03 DIAGNOSIS — I059 Rheumatic mitral valve disease, unspecified: Secondary | ICD-10-CM | POA: Diagnosis not present

## 2016-02-03 DIAGNOSIS — E782 Mixed hyperlipidemia: Secondary | ICD-10-CM | POA: Diagnosis not present

## 2016-02-03 NOTE — Telephone Encounter (Signed)
Pharmacy requested it on a paper refill, I am guessing patient gets it as a prescription for cost purposes. thanks

## 2016-02-03 NOTE — Telephone Encounter (Signed)
This is an over the counter medication.

## 2016-02-03 NOTE — Telephone Encounter (Signed)
Please advise for refill as this was a historical prior, thanks

## 2016-02-04 MED ORDER — CETIRIZINE HCL 10 MG PO TABS: 10.0000 mg | ORAL_TABLET | Freq: Every day | ORAL | 1 refills | Status: DC | PRN

## 2016-02-07 ENCOUNTER — Encounter: Payer: Self-pay | Admitting: Emergency Medicine

## 2016-02-07 ENCOUNTER — Emergency Department
Admission: EM | Admit: 2016-02-07 | Discharge: 2016-02-07 | Disposition: A | Discharge status: Home / Self Care | Payer: Medicare Other | Attending: Emergency Medicine | Admitting: Emergency Medicine

## 2016-02-07 DIAGNOSIS — Z7982 Long term (current) use of aspirin: Secondary | ICD-10-CM | POA: Insufficient documentation

## 2016-02-07 DIAGNOSIS — M25531 Pain in right wrist: Secondary | ICD-10-CM | POA: Diagnosis not present

## 2016-02-07 DIAGNOSIS — M069 Rheumatoid arthritis, unspecified: Secondary | ICD-10-CM | POA: Diagnosis not present

## 2016-02-07 DIAGNOSIS — Z7951 Long term (current) use of inhaled steroids: Secondary | ICD-10-CM | POA: Diagnosis not present

## 2016-02-07 DIAGNOSIS — I10 Essential (primary) hypertension: Secondary | ICD-10-CM | POA: Insufficient documentation

## 2016-02-07 DIAGNOSIS — M0520 Rheumatoid vasculitis with rheumatoid arthritis of unspecified site: Secondary | ICD-10-CM

## 2016-02-07 DIAGNOSIS — M2550 Pain in unspecified joint: Secondary | ICD-10-CM

## 2016-02-07 DIAGNOSIS — M542 Cervicalgia: Secondary | ICD-10-CM | POA: Diagnosis not present

## 2016-02-07 DIAGNOSIS — M25562 Pain in left knee: Secondary | ICD-10-CM | POA: Diagnosis not present

## 2016-02-07 LAB — CBC WITH DIFFERENTIAL/PLATELET
BASOS ABS: 0 10*3/uL (ref 0–0.1)
BASOS PCT: 1 %
EOS PCT: 3 %
Eosinophils Absolute: 0.2 10*3/uL (ref 0–0.7)
HCT: 35.7 % (ref 35.0–47.0)
Hemoglobin: 12.6 g/dL (ref 12.0–16.0)
Lymphocytes Relative: 19 %
Lymphs Abs: 1.5 10*3/uL (ref 1.0–3.6)
MCH: 32.2 pg (ref 26.0–34.0)
MCHC: 35.4 g/dL (ref 32.0–36.0)
MCV: 91.1 fL (ref 80.0–100.0)
MONO ABS: 0.8 10*3/uL (ref 0.2–0.9)
Monocytes Relative: 10 %
Neutro Abs: 5.1 10*3/uL (ref 1.4–6.5)
Neutrophils Relative %: 67 %
PLATELETS: 164 10*3/uL (ref 150–440)
RBC: 3.92 MIL/uL (ref 3.80–5.20)
RDW: 12.8 % (ref 11.5–14.5)
WBC: 7.7 10*3/uL (ref 3.6–11.0)

## 2016-02-07 LAB — BASIC METABOLIC PANEL
ANION GAP: 9 (ref 5–15)
BUN: 12 mg/dL (ref 6–20)
CALCIUM: 9.2 mg/dL (ref 8.9–10.3)
CO2: 25 mmol/L (ref 22–32)
CREATININE: 0.96 mg/dL (ref 0.44–1.00)
Chloride: 101 mmol/L (ref 101–111)
GFR, EST NON AFRICAN AMERICAN: 58 mL/min — AB (ref >60)
GLUCOSE: 111 mg/dL — AB (ref 65–99)
Potassium: 3.6 mmol/L (ref 3.5–5.1)
Sodium: 135 mmol/L (ref 135–145)

## 2016-02-07 MED ORDER — PREDNISONE 10 MG PO TABS
10.0000 mg | ORAL_TABLET | Freq: Every day | ORAL | 0 refills | Status: DC
Start: 2016-02-07 — End: 2016-06-17

## 2016-02-07 MED ORDER — PREDNISONE 20 MG PO TABS
60.0000 mg | ORAL_TABLET | Freq: Once | ORAL | Status: AC
Start: 2016-02-07 — End: 2016-02-07
  Administered 2016-02-07: 60 mg via ORAL
  Filled 2016-02-07: qty 3

## 2016-02-07 NOTE — ED Triage Notes (Signed)
C/o left knee swelling and right hand pain and swelling - onset last night.  Also c/o pain to back.  Patient states has had similar flairs in the past, but this time is a little different.  Has history of RA.

## 2016-02-07 NOTE — ED Notes (Signed)
AAOx3.  Skin warm and dry. nAD

## 2016-02-07 NOTE — ED Notes (Signed)
Pt states she has a hx of RA. Noticed swelling to her left knee and to right hand.  Edema is non-pitting.  Pt states she thought she was going to have an RA flare up.  Pt states she is due for Remicade infusion at Dr. Scharlene Gloss office.  Pt also c/o back pain. Took Tramadol and Xanax this morning around 0700. Pt is able to ambulate but c/o pain when bearing weight.  Pedal and radial pulses intact.

## 2016-02-07 NOTE — ED Provider Notes (Signed)
[facility] Emergency Department Provider Note  Time seen: 12:39 PM  I have reviewed the triage vital signs and the nursing notes.   HISTORY  Chief Complaint Knee Pain; Wrist Pain; and Back Pain    HPI Kaitlyn Huff is a 75 y.o. female with a past medical history of rheumatoid arthritis, hypertension, hyperlipidemia, who presents to the emergency department for left knee and right wrist swelling and pain. According to the patient for the past 2 or 3 weeks she has had swelling in her left knee. For the past 2-3 days she has had increased pain in the left knee as well as the right wrist. She is also having some pain in her lower neck. States her pain does feel similar to her rheumatoid arthritis flares that she gets. Denies any chest pain or abdominal pain. Denies any trouble breathing. Denies any fever. Patient is due for her Remicade injection tomorrow. Describes the pain as moderate dull pain, worse with movement of the joints.  Past Medical History:  Diagnosis Date  . Anemia   . Diverticulitis   . GERD (gastroesophageal reflux disease)   . Hyperlipidemia   . Hypertension   . Osteoarthritis   . Osteoporosis    actonel  . Positive PPD    s/p INH (2006)  . Rheumatoid arthritis(714.0)    MTX transaminitis, Leflunomide (rash), enbrel, plaquinil, prednisone, remicade, Imuran (transaminitis)  . Valvular heart disease    moderate MR and TR    Patient Active Problem List   Diagnosis Date Noted  . Cough 01/27/2016  . Hypoxia 12/20/2015  . Lower extremity edema 12/18/2015  . Diarrhea 09/15/2015  . CAP (community acquired pneumonia) 08/30/2015  . Sepsis ([place]) 08/18/2015  . Restless leg syndrome 08/07/2015  . History of colonic polyps 07/10/2015  . Health care maintenance 01/11/2015  . Weight loss 08/04/2013  . Obstructive sleep apnea 12/23/2012  . Dilated bile duct 12/23/2012  . Hypercholesterolemia 08/12/2012  . Rheumatoid arthritis ([place]) 08/12/2012   . GERD (gastroesophageal reflux disease) 08/12/2012  . Hypertension 08/12/2012  . Anemia 08/12/2012    Past Surgical History:  Procedure Laterality Date  . ABDOMINAL HYSTERECTOMY    . CERVICAL CONE BIOPSY    . CHOLECYSTECTOMY  06/22/14  . COLONOSCOPY WITH PROPOFOL N/A 07/09/2015   Procedure: COLONOSCOPY WITH PROPOFOL;  Surgeon: Manya Silvas, MD;  Location: [facility] ENDOSCOPY;  Service: Endoscopy;  Laterality: N/A;  . ESOPHAGOGASTRODUODENOSCOPY (EGD) WITH PROPOFOL N/A 07/09/2015   Procedure: ESOPHAGOGASTRODUODENOSCOPY (EGD) WITH PROPOFOL;  Surgeon: Manya Silvas, MD;  Location: [facility] ENDOSCOPY;  Service: Endoscopy;  Laterality: N/A;  . OVARY SURGERY    . TRACHEOSTOMY  1959  . TUBAL LIGATION      Prior to Admission medications   Medication Sig Start Date End Date Taking? Authorizing Provider  acidophilus (RISAQUAD) CAPS capsule Take 1 capsule by mouth daily.    Historical Provider, MD  albuterol (PROVENTIL HFA;VENTOLIN HFA) 108 (90 Base) MCG/ACT inhaler Inhale 2 puffs into the lungs every 4 (four) hours as needed for wheezing or shortness of breath. 09/15/15   [name], MD  albuterol (PROVENTIL) (2.5 MG/3ML) 0.083% nebulizer solution Take 2.5 mg by nebulization every 6 (six) hours as needed for wheezing or shortness of breath.    Historical Provider, MD  alendronate (FOSAMAX) 70 MG tablet Take by mouth. 09/04/15   Historical Provider, MD  ALPRAZolam [name]) 0.25 MG tablet Take 1 tablet (0.25 mg total) by mouth daily as needed for anxiety. 10/26/15   Charlene  Nicki Reaper, MD  aspirin EC 81 MG tablet Take 81 mg by mouth daily.    Historical Provider, MD  Calcium Carbonate-Vitamin D (CALCIUM 600+D) 600-400 MG-UNIT tablet Take 1 tablet by mouth 2 (two) times daily.    Historical Provider, MD  cefpodoxime (VANTIN) 200 MG tablet Take 1 tablet (200 mg total) by mouth 2 (two) times daily. 08/22/15   Demetrios Loll, MD  cetirizine (ZYRTEC) 10 MG tablet Take 1 tablet (10 mg total) by mouth daily as needed  for allergies. 02/04/16   [name], MD  chlorpheniramine-HYDROcodone (TUSSIONEX) 10-8 MG/5ML SUER Take 5 mLs by mouth every 12 (twelve) hours as needed for cough. 08/07/15   Theodoro Grist, MD  cholecalciferol (VITAMIN D) 1000 units tablet Take 1,000 Units by mouth daily.    Historical Provider, MD  colestipol (COLESTID) 1 g tablet Take 2 g by mouth 2 (two) times daily.    Historical Provider, MD  Diphenhyd-Hydrocort-Nystatin (FIRST-DUKES MOUTHWASH) SUSP Pt is to swish and spit 5 cc's tid 09/14/15   [name], MD  esomeprazole (NEXIUM) 40 MG capsule Take 40 mg by mouth 2 (two) times daily before a meal.    Historical Provider, MD  FeFum-FePo-FA-B Cmp-C-Zn-Mn-Cu (TANDEM PLUS) 162-115.2-1 MG CAPS Take 1 capsule by mouth daily.    Historical Provider, MD  fluticasone (FLONASE) 50 MCG/ACT nasal spray Place 2 sprays into both nostrils daily. 01/27/16   Leone Haven, MD  Fluticasone-Salmeterol (ADVAIR) 250-50 MCG/DOSE AEPB Inhale 1 puff into the lungs 2 (two) times daily.    Historical Provider, MD  furosemide (LASIX) 20 MG tablet Take 1 tablet (20 mg total) by mouth daily. 12/11/15   [name], MD  furosemide (LASIX) 40 MG tablet Take 40 mg by mouth. Dr [name]    Historical Provider, MD  gabapentin (NEURONTIN) 300 MG capsule Take 300 mg by mouth 4 (four) times daily.    Historical Provider, MD  guaiFENesin-dextromethorphan (ROBITUSSIN DM) 100-10 MG/5ML syrup Take 5 mLs by mouth every 4 (four) hours as needed for cough. 08/07/15   Theodoro Grist, MD  hydroxychloroquine (PLAQUENIL) 200 MG tablet Take 1 tablet (200 mg total) by mouth 2 (two) times daily. 05/13/13   [name], MD  Multiple Vitamin (MULTIVITAMIN WITH MINERALS) TABS tablet Take 1 tablet by mouth daily.    Historical Provider, MD  nystatin cream (MYCOSTATIN) Apply 1 application topically 2 (two) times daily. Apply to affected area (outer vagina) 09/30/15   [name], MD  potassium chloride (MICRO-K) 10 MEQ CR capsule Take 20  mEq by mouth 2 (two) times daily.    Historical Provider, MD  pramipexole (MIRAPEX) 0.25 MG tablet Take 2 tablets (0.5 mg total) by mouth 2 (two) times daily. 09/24/15   [name], MD  simvastatin (ZOCOR) 20 MG tablet TAKE 1 TABLET DAILY 08/24/15   [name], MD  sodium chloride (OCEAN) 0.65 % nasal spray Place 1 spray into the nose as needed for congestion.    Historical Provider, MD  TOPROL XL 50 MG 24 hr tablet TAKE 1 TABLET DAILY. TAKE WITH OR IMMEDIATELY FOLLOWING A MEAL 09/25/15   [name], MD  traMADol (ULTRAM) 50 MG tablet Take 50 mg by mouth 2 (two) times daily as needed for moderate pain.    Historical Provider, MD    Allergies  Allergen Reactions  . Astelin [Azelastine Hcl] Other (See Comments)    Reaction:  Unknown   . Ciprofloxacin Other (See Comments)    Reaction:  GI upset   . Codeine Other (  See Comments)    Reaction:  Altered mental status  . Dilaudid [Hydromorphone] Other (See Comments)    Reaction:  Unknown   . Flexeril [Cyclobenzaprine] Other (See Comments)    Reaction:  Unknown   . Imuran [Azathioprine] Other (See Comments)    Reaction:  Abnormal liver function  . Keflex [Cephalexin] Other (See Comments)    Reaction:  GI upset   . Lisinopril Itching  . Lyrica [Pregabalin] Other (See Comments)    Reaction:  Sore gums   . Methotrexate Derivatives Other (See Comments)    Reaction:  Abnormal liver function  . Amoxicillin Rash and Other (See Comments)    Unable to obtain enough information to answer additional questions about this medication.    Jolee Ewing [Leflunomide] Rash  . Clindamycin/Lincomycin Rash  . Doxycycline Rash  . Lodine [Etodolac] Rash  . Percocet [Oxycodone-Acetaminophen] Rash  . Sulfa Antibiotics Rash    Family History  Problem Relation Age of Onset  . Heart disease Father     MI  . Heart disease Mother   . Valvular heart disease Mother   . Breast cancer Sister 66  . Colon cancer Neg Hx     Social History Social History   Substance Use Topics  . Smoking status: Never Smoker  . Smokeless tobacco: Never Used  . Alcohol use No    Review of Systems Constitutional: Negative for fever. Cardiovascular: Negative for chest pain. Respiratory: Negative for shortness of breath. Gastrointestinal: Negative for abdominal pain Musculoskeletal: Left knee and right wrist pain. Lower neck pain. Neurological: Negative for headaches, focal weakness or numbness. 10-point ROS otherwise negative.  ____________________________________________   PHYSICAL EXAM:  VITAL SIGNS: ED Triage Vitals  Enc Vitals Group     BP 02/07/16 1027 139/67     Pulse Rate 02/07/16 1027 67     Resp 02/07/16 1027 16     Temp 02/07/16 1027 98.3 F (36.8 C)     Temp Source 02/07/16 1027 Oral     SpO2 --      Weight 02/07/16 1027 170 lb (77.1 kg)     Height 02/07/16 1027 5\' 4"  (1.626 m)     Head Circumference --      Peak Flow --      Pain Score 02/07/16 1028 7     Pain Loc --      Pain Edu? --      Excl. in [place]? --     Constitutional: Alert and oriented. Well appearing and in no distress. Eyes: Normal exam ENT   Head: Normocephalic and atraumatic.   Mouth/Throat: Mucous membranes are moist. Cardiovascular: Normal rate, regular rhythm. No murmur Respiratory: Normal respiratory effort without tachypnea nor retractions. Breath sounds are clear  Gastrointestinal: Soft and nontender. No distention.   Musculoskeletal: Mild edema to left knee, mild tenderness to palpation, mild pain with range of motion. No appreciable swelling to right wrist, moderate tenderness palpation with mild pain. Range of motion. No erythema noted over either joint. No signs of cellulitis. Calfs are nontender. Neurovascular intact distally. Neurologic:  Normal speech and language. No gross focal neurologic deficits are appreciated. Skin:  Skin is warm, dry and intact.  Psychiatric: Mood and affect are normal. Speech and behavior are normal.    ____________________________________________    INITIAL IMPRESSION / ASSESSMENT AND PLAN / ED COURSE  Pertinent labs & imaging results that were available during my care of the patient were reviewed by me and considered in my medical decision making (see  chart for details).  Patient presents the emergency department with signs and symptoms suggestive of arthritis flare. Denies any history of gout. Denies fever. No erythema noted on exam. Mild to moderate tenderness palpation of the left knee and right wrist on exam. No trauma. Patient has a Remicade infusion tomorrow. We will place the patient on a course of steroids. The patient states this feels very similar to her rheumatoid arthritis flares.  ____________________________________________   FINAL CLINICAL IMPRESSION(S) / ED DIAGNOSES  Rheumatoid arthritis Joint pain    Harvest Dark, MD 02/07/16 1242

## 2016-02-09 DIAGNOSIS — M0579 Rheumatoid arthritis with rheumatoid factor of multiple sites without organ or systems involvement: Secondary | ICD-10-CM | POA: Diagnosis not present

## 2016-02-10 ENCOUNTER — Other Ambulatory Visit: Payer: Self-pay | Admitting: Internal Medicine

## 2016-02-12 ENCOUNTER — Other Ambulatory Visit: Payer: Self-pay | Admitting: *Deleted

## 2016-02-12 MED ORDER — ALPRAZOLAM 0.25 MG PO TABS: 0.2500 mg | ORAL_TABLET | Freq: Every day | ORAL | 0 refills | Status: DC | PRN

## 2016-02-12 NOTE — Telephone Encounter (Signed)
Okay to refill? Last seen on 12/18/15 & next OV: 03/21/16.

## 2016-02-15 ENCOUNTER — Telehealth: Payer: Self-pay | Admitting: Internal Medicine

## 2016-02-15 NOTE — Telephone Encounter (Signed)
Pt called needing a refill for ALPRAZolam (XANAX) 0.25 MG tablet.  Pharmacy is [facility], [place]  Call pt @ [phone]. Thank you!

## 2016-02-15 NOTE — Telephone Encounter (Signed)
Rx was sent to pharmacy on 02/12/16

## 2016-02-16 ENCOUNTER — Ambulatory Visit
Admission: RE | Admit: 2016-02-16 | Discharge: 2016-02-16 | Disposition: A | Discharge status: Home / Self Care | Payer: Medicare Other | Source: Ambulatory Visit | Attending: Internal Medicine | Admitting: Internal Medicine

## 2016-02-16 ENCOUNTER — Other Ambulatory Visit: Payer: Self-pay | Admitting: Internal Medicine

## 2016-02-16 DIAGNOSIS — Z1231 Encounter for screening mammogram for malignant neoplasm of breast: Secondary | ICD-10-CM | POA: Diagnosis not present

## 2016-02-16 DIAGNOSIS — Z1239 Encounter for other screening for malignant neoplasm of breast: Secondary | ICD-10-CM

## 2016-02-18 NOTE — Telephone Encounter (Signed)
Rx was faxed on 02/12/16

## 2016-02-22 DIAGNOSIS — M79609 Pain in unspecified limb: Secondary | ICD-10-CM | POA: Diagnosis not present

## 2016-02-22 DIAGNOSIS — I890 Lymphedema, not elsewhere classified: Secondary | ICD-10-CM | POA: Diagnosis not present

## 2016-02-22 DIAGNOSIS — I8393 Asymptomatic varicose veins of bilateral lower extremities: Secondary | ICD-10-CM | POA: Diagnosis not present

## 2016-02-22 DIAGNOSIS — I8311 Varicose veins of right lower extremity with inflammation: Secondary | ICD-10-CM | POA: Diagnosis not present

## 2016-02-22 DIAGNOSIS — I872 Venous insufficiency (chronic) (peripheral): Secondary | ICD-10-CM | POA: Diagnosis not present

## 2016-02-22 DIAGNOSIS — I8312 Varicose veins of left lower extremity with inflammation: Secondary | ICD-10-CM | POA: Diagnosis not present

## 2016-02-22 DIAGNOSIS — M7989 Other specified soft tissue disorders: Secondary | ICD-10-CM | POA: Diagnosis not present

## 2016-03-18 ENCOUNTER — Encounter: Payer: Self-pay | Admitting: Family Medicine

## 2016-03-18 ENCOUNTER — Ambulatory Visit (INDEPENDENT_AMBULATORY_CARE_PROVIDER_SITE_OTHER): Payer: Medicare Other

## 2016-03-18 ENCOUNTER — Ambulatory Visit (INDEPENDENT_AMBULATORY_CARE_PROVIDER_SITE_OTHER): Payer: Medicare Other | Admitting: Family Medicine

## 2016-03-18 ENCOUNTER — Telehealth: Payer: Self-pay | Admitting: *Deleted

## 2016-03-18 VITALS — BP 128/78 | HR 65 | Temp 97.7°F | Wt 172.8 lb

## 2016-03-18 DIAGNOSIS — I208 Other forms of angina pectoris: Secondary | ICD-10-CM | POA: Diagnosis not present

## 2016-03-18 DIAGNOSIS — R059 Cough, unspecified: Secondary | ICD-10-CM

## 2016-03-18 DIAGNOSIS — S99922A Unspecified injury of left foot, initial encounter: Secondary | ICD-10-CM

## 2016-03-18 DIAGNOSIS — R05 Cough: Secondary | ICD-10-CM

## 2016-03-18 DIAGNOSIS — S92512A Displaced fracture of proximal phalanx of left lesser toe(s), initial encounter for closed fracture: Secondary | ICD-10-CM | POA: Diagnosis not present

## 2016-03-18 DIAGNOSIS — S92909A Unspecified fracture of unspecified foot, initial encounter for closed fracture: Secondary | ICD-10-CM | POA: Insufficient documentation

## 2016-03-18 NOTE — Patient Instructions (Signed)
Nice to see you. We are going to obtain an x-ray of your chest and left foot. I will provide you with a prescription for a wooden shoe. You should hold onto this until you hear from [place] regarding your x-ray results. If you develop shortness of breath, cough productive of blood, fevers, chest pain, or any new or changing symptoms please seek medical attention.

## 2016-03-18 NOTE — Progress Notes (Signed)
Pre visit review using our clinic review tool, if applicable. No additional management support is needed unless otherwise documented below in the visit note. 

## 2016-03-18 NOTE — Telephone Encounter (Signed)
Thanks for forwarding this. I put her in a post op shoe and advised her to buddy tape her toes and follow-up in 4 weeks for recheck.

## 2016-03-18 NOTE — Telephone Encounter (Signed)
Spoke to Lawrence,  She stated that patients Xray was as follows: Recent Fracture 5th Proximal phalanx, Alignment is Atomic. No other fracture or Dislocation.

## 2016-03-18 NOTE — Progress Notes (Signed)
Tommi Rumps, MD Phone: [phone]  Kaitlyn Huff is a 75 y.o. female who presents today for same-day visit.  Patient notes persistent cough since she last had pneumonia. Notes this is intermittent. Sometimes she'll have a coughing spell and other times she'll be fine. Notes a mild tightness in her back with this though no chest pain. Minimal shortness of breath if she bends over though none at any other time. Taking her Advair and albuterol. Albuterol once a day. No documented fevers. Did feel warm several days ago. No wheezing. His coughing whitish clear mucus up.  Patient additionally notes 2-3 weeks ago she hit her left fifth toe on a vacuum cleaner and notes she had pain and swelling and redness over this toe and her third and fourth toes. Notes report does have improved. The fifth toe has continued to be swollen and mildly red. Notes there is some discomfort in the toe. No sensory issues or weakness in the foot.   ROS see history of present illness  Objective  Physical Exam Vitals:   03/18/16 1030  BP: 128/78  Pulse: 65  Temp: 97.7 F (36.5 C)    BP Readings from Last 3 Encounters:  03/18/16 128/78  02/07/16 138/68  01/27/16 118/64   Wt Readings from Last 3 Encounters:  03/18/16 172 lb 12.8 oz (78.4 kg)  02/07/16 170 lb (77.1 kg)  01/27/16 173 lb 9.6 oz (78.7 kg)    Physical Exam  Constitutional: No distress.  HENT:  Head: Normocephalic and atraumatic.  Cardiovascular: Normal rate, regular rhythm and normal heart sounds.   Pulmonary/Chest: Effort normal. No respiratory distress.  Mild coarse breath sounds in bilateral bases otherwise normal breath sounds  Musculoskeletal:  Left fifth toe mildly swollen with mild erythema and tenderness, no warmth, other toes with no swelling or erythema, sensation to light touch intact in bilateral feet, 5 out of 5 strength in plantar flexion and dorsiflexion bilaterally, 2+ DP pulses bilaterally  Neurological: She is alert.  Gait normal.  Skin: Skin is warm and dry. She is not diaphoretic.     Assessment/Plan: Please see individual problem list.  Cough Patient with persistent intermittent cough. Mild coarseness on lung exam. Vital signs stable. Given coarseness and persistent cough we will obtain a chest x-ray to evaluate for underlying cause. If pneumonia we'll treat with antibiotics. If no pneumonia we'll monitor.  Foot injury Exam concerning for fracture. Will obtain an x-ray of her foot to evaluate further. She is provided with a prescription for a wooden shoe to fill if there is a fracture. We will contact her after the x-rays result.   Orders Placed This Encounter  Procedures  . DG Chest 2 View    Standing Status:   Future    Number of Occurrences:   1    Standing Expiration Date:   05/18/2017    Order Specific Question:   Reason for Exam (SYMPTOM  OR DIAGNOSIS REQUIRED)    Answer:   cough, congestion, minimal intermittent dyspnea    Order Specific Question:   Preferred imaging location?    Answer:   [facility]  . DG Foot Complete Left    Standing Status:   Future    Number of Occurrences:   1    Standing Expiration Date:   05/18/2017    Order Specific Question:   Reason for Exam (SYMPTOM  OR DIAGNOSIS REQUIRED)    Answer:   left foot injury with persistent 5th toe swelling and tenderness  Order Specific Question:   Preferred imaging location?    Answer:   [facility]    Tommi Rumps, MD [place]

## 2016-03-18 NOTE — Assessment & Plan Note (Signed)
Exam concerning for fracture. Will obtain an x-ray of her foot to evaluate further. She is provided with a prescription for a wooden shoe to fill if there is a fracture. We will contact her after the x-rays result.

## 2016-03-18 NOTE — Telephone Encounter (Signed)
Stacy from [facility] has requested a return call, she has a call report for [employer] [phone]

## 2016-03-18 NOTE — Telephone Encounter (Signed)
FYI.  Looks like you saw her today.  Let me know if I need to f/u or do anything more.

## 2016-03-18 NOTE — Assessment & Plan Note (Signed)
Patient with persistent intermittent cough. Mild coarseness on lung exam. Vital signs stable. Given coarseness and persistent cough we will obtain a chest x-ray to evaluate for underlying cause. If pneumonia we'll treat with antibiotics. If no pneumonia we'll monitor.

## 2016-03-21 ENCOUNTER — Ambulatory Visit (INDEPENDENT_AMBULATORY_CARE_PROVIDER_SITE_OTHER): Payer: Medicare Other | Admitting: Internal Medicine

## 2016-03-21 ENCOUNTER — Encounter: Payer: Self-pay | Admitting: Internal Medicine

## 2016-03-21 VITALS — BP 116/68 | HR 101 | Temp 98.5°F | Ht 64.0 in | Wt 170.6 lb

## 2016-03-21 DIAGNOSIS — I10 Essential (primary) hypertension: Secondary | ICD-10-CM | POA: Diagnosis not present

## 2016-03-21 DIAGNOSIS — E7800 Pure hypercholesterolemia, unspecified: Secondary | ICD-10-CM | POA: Diagnosis not present

## 2016-03-21 DIAGNOSIS — R000 Tachycardia, unspecified: Secondary | ICD-10-CM

## 2016-03-21 DIAGNOSIS — R059 Cough, unspecified: Secondary | ICD-10-CM

## 2016-03-21 DIAGNOSIS — I208 Other forms of angina pectoris: Secondary | ICD-10-CM

## 2016-03-21 DIAGNOSIS — R05 Cough: Secondary | ICD-10-CM

## 2016-03-21 DIAGNOSIS — M069 Rheumatoid arthritis, unspecified: Secondary | ICD-10-CM | POA: Diagnosis not present

## 2016-03-21 DIAGNOSIS — D649 Anemia, unspecified: Secondary | ICD-10-CM

## 2016-03-21 MED ORDER — PREDNISONE 5 MG PO TABS: ORAL_TABLET | ORAL | 0 refills | Status: DC

## 2016-03-21 NOTE — Progress Notes (Signed)
Pre visit review using our clinic review tool, if applicable. No additional management support is needed unless otherwise documented below in the visit note. 

## 2016-03-21 NOTE — Progress Notes (Signed)
Patient ID: Kaitlyn Huff, female   DOB: [birth year], 75 y.o.   MRN: [id number]   Subjective:    Patient ID: Kaitlyn Huff, female    DOB: 1941-07-04, 75 y.o.   MRN: [id number]  HPI  Patient here for a scheduled follow up.  She is concerned regarding some swelling and redness in her left foot.  She was seen last week after foot injury.  See note.  Found to have fracture of the fifth proximal phalanx.  Old fractures second metatarsal with remodeling.  Was supposed to get a post op shoe.  She still is not wearing this.  Reports that over the last few days the swelling has increased.  Some increased erythema.  No other injury.  She also reports her right knee has been sore.  Recently flared.  Some swelling.  She tries to stay active.  Has been sitting with leg down.  No chest pain.  Does report noticing some increased heart rate and palpitations.  This is intermittent and usually brief.  Had episode just prior to her appt.  Initially on exam, increased heart rate noted.  No acid reflux reported.  No abdominal pain.     Past Medical History:  Diagnosis Date  . Anemia   . Diverticulitis   . GERD (gastroesophageal reflux disease)   . Hyperlipidemia   . Hypertension   . Osteoarthritis   . Osteoporosis    actonel  . Positive PPD    s/p INH (2006)  . Rheumatoid arthritis(714.0)    MTX transaminitis, Leflunomide (rash), enbrel, plaquinil, prednisone, remicade, Imuran (transaminitis)  . 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 ABDOMINAL HYSTERECTOMY    . CERVICAL CONE BIOPSY    . CHOLECYSTECTOMY  06/22/14  . COLONOSCOPY WITH PROPOFOL N/A 07/09/2015   Procedure: COLONOSCOPY WITH PROPOFOL;  Surgeon: Manya Silvas, MD;  Location: [facility] ENDOSCOPY;  Service: Endoscopy;  Laterality: N/A;  . ESOPHAGOGASTRODUODENOSCOPY (EGD) WITH PROPOFOL N/A 07/09/2015   Procedure: ESOPHAGOGASTRODUODENOSCOPY (EGD) WITH PROPOFOL;  Surgeon: Manya Silvas, MD;  Location: [facility] END[facility]COPY;  Service: Endoscopy;  Laterality: N/A;  . OVARY SURGERY    . TRACHEOSTOMY  1959  . TUBAL LIGATION     Family History  Problem Relation Age of Onset  . Heart disease Father     MI  . Heart disease Mother   . Valvular heart disease Mother   . Breast cancer Sister 31  . Colon cancer Neg Hx    Social History   Social History  . Marital status: Widowed    Spouse name: N/A  . Number of children: 4  . Years of education: N/A   Occupational History  . retired    Social History Main Topics  . Smoking status: Never Smoker  . Smokeless tobacco: Never Used  . Alcohol use No  . Drug use: No  . Sexual activity: Not Asked   Other Topics Concern  . None   Social History Narrative   Lives with family at home    Outpatient Encounter Prescriptions as of 03/21/2016  Medication Sig  . acidophilus (RISAQUAD) CAPS capsule Take 1 capsule by mouth daily.  Marland Kitchen ADVAIR DISKUS 250-50 MCG/DOSE AEPB USE 1 INHALATION EVERY 12 HOURS  . albuterol (PROVENTIL) (2.5 MG/3ML) 0.083% nebulizer solution Take 2.5 mg by nebulization every 6 (six) hours as needed for wheezing or shortness of breath.  Marland Kitchen alendronate (FOSAMAX) 70 MG tablet Take  by mouth.  . ALPRAZolam (XANAX) 0.25 MG tablet Take 1 tablet (0.25 mg total) by mouth daily as needed for anxiety.  Marland Kitchen aspirin EC 81 MG tablet Take 81 mg by mouth daily.  . Calcium Carbonate-Vitamin D (CALCIUM 600+D) 600-400 MG-UNIT tablet Take 1 tablet by mouth 2 (two) times daily.  . cetirizine (ZYRTEC) 10 MG tablet Take 1 tablet (10 mg total) by mouth daily as needed for allergies.  . chlorpheniramine-HYDROcodone (TUSSIONEX) 10-8 MG/5ML SUER Take 5 mLs by mouth every 12 (twelve) hours as needed for cough.  . cholecalciferol (VITAMIN D) 1000 units tablet Take 1,000 Units by mouth daily.  . colestipol (COLESTID) 1 g tablet Take 2 g by mouth 2 (two) times daily.  . Diphenhyd-Hydrocort-Nystatin (FIRST-DUKES MOUTHWASH) SUSP Pt is to  swish and spit 5 cc's tid  . esomeprazole (NEXIUM) 40 MG capsule Take 40 mg by mouth 2 (two) times daily before a meal.  . FeFum-FePo-FA-B Cmp-C-Zn-Mn-Cu (TANDEM PLUS) 162-115.2-1 MG CAPS Take 1 capsule by mouth daily.  . fluticasone (FLONASE) 50 MCG/ACT nasal spray Place 2 sprays into both nostrils daily.  . Fluticasone-Salmeterol (ADVAIR) 250-50 MCG/DOSE AEPB Inhale 1 puff into the lungs 2 (two) times daily.  . furosemide (LASIX) 20 MG tablet Take 1 tablet (20 mg total) by mouth daily.  . furosemide (LASIX) 40 MG tablet Take 40 mg by mouth. Dr [name]  . gabapentin (NEURONTIN) 300 MG capsule Take 300 mg by mouth 4 (four) times daily.  Marland Kitchen guaiFENesin-dextromethorphan (ROBITUSSIN DM) 100-10 MG/5ML syrup Take 5 mLs by mouth every 4 (four) hours as needed for cough.  . hydroxychloroquine (PLAQUENIL) 200 MG tablet Take 1 tablet (200 mg total) by mouth 2 (two) times daily.  . Multiple Vitamin (MULTIVITAMIN WITH MINERALS) TABS tablet Take 1 tablet by mouth daily.  Marland Kitchen nystatin cream (MYCOSTATIN) Apply 1 application topically 2 (two) times daily. Apply to affected area (outer vagina)  . potassium chloride (MICRO-K) 10 MEQ CR capsule Take 20 mEq by mouth 2 (two) times daily.  . pramipexole (MIRAPEX) 0.25 MG tablet Take 2 tablets (0.5 mg total) by mouth 2 (two) times daily.  . predniSONE (DELTASONE) 10 MG tablet Take 1 tablet (10 mg total) by mouth daily. Day 1: take 60mg  PO.  Decrease by 10mg  each day until Rx is gone.  Marland Kitchen PROAIR HFA 108 (90 Base) MCG/ACT inhaler USE 2 INHALATIONS EVERY 4 HOURS AS NEEDED FOR WHEEZING OR SHORTNESS OF BREATH  . simvastatin (ZOCOR) 20 MG tablet TAKE 1 TABLET DAILY  . sodium chloride (OCEAN) 0.65 % nasal spray Place 1 spray into the nose as needed for congestion.  . TOPROL XL 50 MG 24 hr tablet TAKE 1 TABLET DAILY. TAKE WITH OR IMMEDIATELY FOLLOWING A MEAL  . traMADol (ULTRAM) 50 MG tablet Take 50 mg by mouth 2 (two) times daily as needed for moderate pain.  . predniSONE  (DELTASONE) 5 MG tablet Take 6 tablets (30mg )  x 1 day and then decrease by 1 tablet each day until down to zero mg.   No facility-administered encounter medications on file as of 03/21/2016.     Review of Systems  Constitutional: Negative for appetite change and unexpected weight change.  HENT: Negative for congestion and sinus pressure.   Respiratory: Positive for cough. Negative for chest tightness and shortness of breath.   Cardiovascular: Positive for palpitations. Negative for chest pain.       Left foot swelling.  No lower extremity swelling.   Gastrointestinal: Negative for diarrhea, nausea and vomiting.  Genitourinary: Negative for difficulty urinating and dysuria.  Musculoskeletal:       Left foot pain.  Some minimal pain with weight bearing.  Some right knee pain.   Skin:       Increased erythema - left foot.  Some increases soft tissue swelling.    Neurological: Negative for dizziness, light-headedness and headaches.  Psychiatric/Behavioral: Positive for sleep disturbance. Negative for agitation and dysphoric mood.       Objective:    Physical Exam  Constitutional: She appears well-developed and well-nourished. No distress.  HENT:  Nose: Nose normal.  Mouth/Throat: Oropharynx is clear and moist.  Neck: Neck supple. No thyromegaly present.  Cardiovascular:  Initially on exam - heart rate increased.  Question of irregularity.  Follow up heart exam prior to leaving - heart regular.  Rated controlled.    Pulmonary/Chest: Breath sounds normal. No respiratory distress. She has no wheezes.  Abdominal: Soft. Bowel sounds are normal. There is no tenderness.  Musculoskeletal: She exhibits no tenderness.  Increased soft tissue swelling left foot.  Minimal swelling right knee.  No calf swelling or increased erythema.    Lymphadenopathy:    She has no cervical adenopathy.  Skin:  Erythema noted on left foot.    Psychiatric: She has a normal mood and affect. Her behavior is  normal.    BP 116/68   Pulse (!) 101   Temp 98.5 F (36.9 C) (Oral)   Ht 5\' 4"  (1.626 m)   Wt 170 lb 9.6 oz (77.4 kg)   SpO2 94%   BMI 29.28 kg/m  Wt Readings from Last 3 Encounters:  03/21/16 170 lb 9.6 oz (77.4 kg)  03/18/16 172 lb 12.8 oz (78.4 kg)  02/07/16 170 lb (77.1 kg)     Lab Results  Component Value Date   WBC 7.7 02/07/2016   HGB 12.6 02/07/2016   HCT 35.7 02/07/2016   PLT 164 02/07/2016   GLUCOSE 111 (H) 02/07/2016   CHOL 92 08/04/2015   TRIG 53 08/04/2015   HDL 51 08/04/2015   LDLDIRECT 73.7 08/20/2013   LDLCALC 30 08/04/2015   ALT 29 08/18/2015   AST 41 08/18/2015   NA 135 02/07/2016   K 3.6 02/07/2016   CL 101 02/07/2016   CREATININE 0.96 02/07/2016   BUN 12 02/07/2016   CO2 25 02/07/2016   TSH 1.18 12/18/2015   INR 1.07 08/03/2015    Mm Digital Screening Bilateral  Result Date: 02/16/2016 CLINICAL DATA:  Screening. EXAM: DIGITAL SCREENING BILATERAL MAMMOGRAM WITH CAD COMPARISON:  Previous exam(s). ACR Breast Density Category b: There are scattered areas of fibroglandular density. FINDINGS: There are no findings suspicious for malignancy. Images were processed with CAD. IMPRESSION: No mammographic evidence of malignancy. A result letter of this screening mammogram will be mailed directly to the patient. RECOMMENDATION: Screening mammogram in one year. (Code:SM-B-01Y) BI-RADS CATEGORY  1: Negative. Electronically Signed   By: Altamese Cabal M.D.   On: 02/16/2016 15:04       Assessment & Plan:   Problem List Items Addressed This Visit    Anemia    Recheck cbc.       Cough    Just had f/u cxr.  Lungs clear.  Follow.        Hypercholesterolemia    Low cholesterol diet and exercise.  Follow lipid panel.        Hypertension    Blood pressure under good control.  Continue same medication regimen.  Follow pressures.  Follow metabolic panel.  Rheumatoid arthritis ([place])    Followed by Dr [name].  On low dose prednisone daily.  With  increased swelling and redness of left foot.  Appears to be more c/w acute inflamed joint.  Had the previous fracture.  Swelling and redness since.  Will give a prednisone boost (30mg  and taper).  Elevate legs.  Wear post op shoe.  Hold abx.  Notify Dr [name].        Relevant Medications   predniSONE (DELTASONE) 5 MG tablet   Other Relevant Orders   CBC with Differential/Platelet   Basic metabolic panel   Tachycardia - Primary    Increased heart rate initially on exam.  Appeared to be irregular at first.  EKG - SR no acute ischemic changes.  She has noticed intermittent increased heart rate and palpitations.  Will have cardiology evaluate.  Consider holter and further cardiac w/up.  Pt in agreement.        Relevant Orders   EKG 12-Lead (Completed)    Other Visit Diagnoses   None.      [name], MD

## 2016-03-22 ENCOUNTER — Encounter: Payer: Self-pay | Admitting: Internal Medicine

## 2016-03-22 ENCOUNTER — Telehealth: Payer: Self-pay

## 2016-03-22 DIAGNOSIS — R000 Tachycardia, unspecified: Secondary | ICD-10-CM | POA: Insufficient documentation

## 2016-03-22 LAB — BASIC METABOLIC PANEL
BUN: 12 mg/dL (ref 6–23)
CHLORIDE: 97 meq/L (ref 96–112)
CO2: 30 meq/L (ref 19–32)
CREATININE: 1.01 mg/dL (ref 0.40–1.20)
Calcium: 8.8 mg/dL (ref 8.4–10.5)
GFR: 57.12 mL/min — ABNORMAL LOW (ref >60.00)
GLUCOSE: 138 mg/dL — AB (ref 70–99)
POTASSIUM: 3.9 meq/L (ref 3.5–5.1)
Sodium: 135 mEq/L (ref 135–145)

## 2016-03-22 LAB — CBC WITH DIFFERENTIAL/PLATELET
BASOS ABS: 0 10*3/uL (ref 0.0–0.1)
BASOS PCT: 0.3 % (ref 0.0–3.0)
EOS ABS: 0.1 10*3/uL (ref 0.0–0.7)
Eosinophils Relative: 0.7 % (ref 0.0–5.0)
HEMATOCRIT: 35.7 % — AB (ref 36.0–46.0)
Hemoglobin: 12.5 g/dL (ref 12.0–15.0)
LYMPHS ABS: 1.3 10*3/uL (ref 0.7–4.0)
Lymphocytes Relative: 10.9 % — ABNORMAL LOW (ref 12.0–46.0)
MCHC: 35 g/dL (ref 30.0–36.0)
MCV: 91.4 fl (ref 78.0–100.0)
MONO ABS: 1 10*3/uL (ref 0.1–1.0)
Monocytes Relative: 8.6 % (ref 3.0–12.0)
NEUTROS ABS: 9.7 10*3/uL — AB (ref 1.4–7.7)
NEUTROS PCT: 79.5 % — AB (ref 43.0–77.0)
PLATELETS: 197 10*3/uL (ref 150.0–400.0)
RBC: 3.91 Mil/uL (ref 3.87–5.11)
RDW: 13.3 % (ref 11.5–15.5)
WBC: 12.2 10*3/uL — ABNORMAL HIGH (ref 4.0–10.5)

## 2016-03-22 NOTE — Telephone Encounter (Signed)
-----   Message from [name], MD sent at 03/22/2016  3:39 PM EDT ----- Regarding: appt  Please notify pt that Dr Ubaldo Glassing wants to see her tomorrow at 11:45.  Thanks    Dr Nicki Reaper

## 2016-03-22 NOTE — Assessment & Plan Note (Signed)
Blood pressure under good control.  Continue same medication regimen.  Follow pressures.  Follow metabolic panel.   

## 2016-03-22 NOTE — Assessment & Plan Note (Signed)
Recheck cbc.  

## 2016-03-22 NOTE — Assessment & Plan Note (Signed)
Increased heart rate initially on exam.  Appeared to be irregular at first.  EKG - SR no acute ischemic changes.  She has noticed intermittent increased heart rate and palpitations.  Will have cardiology evaluate.  Consider holter and further cardiac w/up.  Pt in agreement.

## 2016-03-22 NOTE — Assessment & Plan Note (Signed)
Low cholesterol diet and exercise.  Follow lipid panel.   

## 2016-03-22 NOTE — Assessment & Plan Note (Signed)
Just had f/u cxr.  Lungs clear.  Follow.

## 2016-03-22 NOTE — Telephone Encounter (Signed)
Patient notified

## 2016-03-22 NOTE — Assessment & Plan Note (Signed)
Followed by Dr [name].  On low dose prednisone daily.  With increased swelling and redness of left foot.  Appears to be more c/w acute inflamed joint.  Had the previous fracture.  Swelling and redness since.  Will give a prednisone boost (30mg  and taper).  Elevate legs.  Wear post op shoe.  Hold abx.  Notify Dr [name].

## 2016-03-23 ENCOUNTER — Other Ambulatory Visit: Payer: Self-pay | Admitting: Internal Medicine

## 2016-03-23 DIAGNOSIS — I10 Essential (primary) hypertension: Secondary | ICD-10-CM | POA: Diagnosis not present

## 2016-03-23 DIAGNOSIS — E782 Mixed hyperlipidemia: Secondary | ICD-10-CM | POA: Diagnosis not present

## 2016-03-23 DIAGNOSIS — I38 Endocarditis, valve unspecified: Secondary | ICD-10-CM | POA: Diagnosis not present

## 2016-03-23 DIAGNOSIS — I059 Rheumatic mitral valve disease, unspecified: Secondary | ICD-10-CM | POA: Diagnosis not present

## 2016-03-23 DIAGNOSIS — R000 Tachycardia, unspecified: Secondary | ICD-10-CM | POA: Diagnosis not present

## 2016-03-23 DIAGNOSIS — R002 Palpitations: Secondary | ICD-10-CM | POA: Diagnosis not present

## 2016-03-24 DIAGNOSIS — M0579 Rheumatoid arthritis with rheumatoid factor of multiple sites without organ or systems involvement: Secondary | ICD-10-CM | POA: Diagnosis not present

## 2016-03-24 DIAGNOSIS — M810 Age-related osteoporosis without current pathological fracture: Secondary | ICD-10-CM | POA: Diagnosis not present

## 2016-03-24 DIAGNOSIS — S92515A Nondisplaced fracture of proximal phalanx of left lesser toe(s), initial encounter for closed fracture: Secondary | ICD-10-CM | POA: Diagnosis not present

## 2016-03-29 ENCOUNTER — Other Ambulatory Visit: Payer: Self-pay | Admitting: Internal Medicine

## 2016-03-31 DIAGNOSIS — M79609 Pain in unspecified limb: Secondary | ICD-10-CM | POA: Diagnosis not present

## 2016-03-31 DIAGNOSIS — I8311 Varicose veins of right lower extremity with inflammation: Secondary | ICD-10-CM | POA: Diagnosis not present

## 2016-03-31 DIAGNOSIS — I8393 Asymptomatic varicose veins of bilateral lower extremities: Secondary | ICD-10-CM | POA: Diagnosis not present

## 2016-03-31 DIAGNOSIS — I8312 Varicose veins of left lower extremity with inflammation: Secondary | ICD-10-CM | POA: Diagnosis not present

## 2016-03-31 DIAGNOSIS — I890 Lymphedema, not elsewhere classified: Secondary | ICD-10-CM | POA: Diagnosis not present

## 2016-03-31 DIAGNOSIS — I872 Venous insufficiency (chronic) (peripheral): Secondary | ICD-10-CM | POA: Diagnosis not present

## 2016-03-31 DIAGNOSIS — R000 Tachycardia, unspecified: Secondary | ICD-10-CM | POA: Diagnosis not present

## 2016-03-31 DIAGNOSIS — M7989 Other specified soft tissue disorders: Secondary | ICD-10-CM | POA: Diagnosis not present

## 2016-04-04 DIAGNOSIS — I8393 Asymptomatic varicose veins of bilateral lower extremities: Secondary | ICD-10-CM | POA: Diagnosis not present

## 2016-04-04 DIAGNOSIS — I8312 Varicose veins of left lower extremity with inflammation: Secondary | ICD-10-CM | POA: Diagnosis not present

## 2016-04-04 DIAGNOSIS — M7989 Other specified soft tissue disorders: Secondary | ICD-10-CM | POA: Diagnosis not present

## 2016-04-04 DIAGNOSIS — I872 Venous insufficiency (chronic) (peripheral): Secondary | ICD-10-CM | POA: Diagnosis not present

## 2016-04-04 DIAGNOSIS — I8311 Varicose veins of right lower extremity with inflammation: Secondary | ICD-10-CM | POA: Diagnosis not present

## 2016-04-04 DIAGNOSIS — I890 Lymphedema, not elsewhere classified: Secondary | ICD-10-CM | POA: Diagnosis not present

## 2016-04-04 DIAGNOSIS — M79609 Pain in unspecified limb: Secondary | ICD-10-CM | POA: Diagnosis not present

## 2016-04-08 ENCOUNTER — Telehealth: Payer: Self-pay

## 2016-04-08 MED ORDER — RABEPRAZOLE SODIUM 20 MG PO TBEC: 20.0000 mg | DELAYED_RELEASE_TABLET | Freq: Two times a day (BID) | ORAL | 1 refills | Status: DC

## 2016-04-08 NOTE — Telephone Encounter (Signed)
PA was completed for Nexium, denied per her insurance as she needs to have tried three drugs, Omeprazole, Pantoprazole and Rabeprazole prior to Nexium... Only has documented trial of the Omeprazole and Pantoprazole.  Needs RX for the last one sent to the pharmacy. thanks

## 2016-04-08 NOTE — Addendum Note (Signed)
Addended by: Alisa Graff on: 04/08/2016 01:30 PM   Modules accepted: Orders

## 2016-04-08 NOTE — Telephone Encounter (Signed)
I sent in rx for aciphex (rabeprazole) to her local pharmacy - for her to try.  Let us know if problems.

## 2016-04-11 DIAGNOSIS — M05731 Rheumatoid arthritis with rheumatoid factor of right wrist without organ or systems involvement: Secondary | ICD-10-CM | POA: Diagnosis not present

## 2016-04-11 DIAGNOSIS — M0579 Rheumatoid arthritis with rheumatoid factor of multiple sites without organ or systems involvement: Secondary | ICD-10-CM | POA: Diagnosis not present

## 2016-04-11 DIAGNOSIS — M05732 Rheumatoid arthritis with rheumatoid factor of left wrist without organ or systems involvement: Secondary | ICD-10-CM | POA: Diagnosis not present

## 2016-04-11 DIAGNOSIS — M810 Age-related osteoporosis without current pathological fracture: Secondary | ICD-10-CM | POA: Diagnosis not present

## 2016-04-18 DIAGNOSIS — R0981 Nasal congestion: Secondary | ICD-10-CM | POA: Diagnosis not present

## 2016-04-18 DIAGNOSIS — D105 Benign neoplasm of other parts of oropharynx: Secondary | ICD-10-CM | POA: Diagnosis not present

## 2016-04-20 DIAGNOSIS — E782 Mixed hyperlipidemia: Secondary | ICD-10-CM | POA: Diagnosis not present

## 2016-04-20 DIAGNOSIS — I10 Essential (primary) hypertension: Secondary | ICD-10-CM | POA: Diagnosis not present

## 2016-04-20 DIAGNOSIS — I059 Rheumatic mitral valve disease, unspecified: Secondary | ICD-10-CM | POA: Diagnosis not present

## 2016-04-21 ENCOUNTER — Other Ambulatory Visit: Payer: Self-pay | Admitting: Internal Medicine

## 2016-04-25 ENCOUNTER — Ambulatory Visit (INDEPENDENT_AMBULATORY_CARE_PROVIDER_SITE_OTHER): Payer: Medicare Other | Admitting: Vascular Surgery

## 2016-04-25 ENCOUNTER — Encounter (INDEPENDENT_AMBULATORY_CARE_PROVIDER_SITE_OTHER): Payer: Self-pay | Admitting: Vascular Surgery

## 2016-04-25 DIAGNOSIS — M79605 Pain in left leg: Secondary | ICD-10-CM | POA: Diagnosis not present

## 2016-04-25 DIAGNOSIS — I8312 Varicose veins of left lower extremity with inflammation: Secondary | ICD-10-CM

## 2016-04-25 DIAGNOSIS — M79609 Pain in unspecified limb: Secondary | ICD-10-CM | POA: Insufficient documentation

## 2016-04-25 DIAGNOSIS — M7989 Other specified soft tissue disorders: Secondary | ICD-10-CM | POA: Diagnosis not present

## 2016-04-25 DIAGNOSIS — I208 Other forms of angina pectoris: Secondary | ICD-10-CM | POA: Diagnosis not present

## 2016-04-25 DIAGNOSIS — I872 Venous insufficiency (chronic) (peripheral): Secondary | ICD-10-CM

## 2016-04-25 DIAGNOSIS — I8311 Varicose veins of right lower extremity with inflammation: Secondary | ICD-10-CM | POA: Diagnosis not present

## 2016-04-25 DIAGNOSIS — M79604 Pain in right leg: Secondary | ICD-10-CM

## 2016-04-26 DIAGNOSIS — M79672 Pain in left foot: Secondary | ICD-10-CM | POA: Diagnosis not present

## 2016-04-28 DIAGNOSIS — Z79899 Other long term (current) drug therapy: Secondary | ICD-10-CM | POA: Diagnosis not present

## 2016-04-28 DIAGNOSIS — H3530 Unspecified macular degeneration: Secondary | ICD-10-CM | POA: Diagnosis not present

## 2016-04-30 NOTE — Progress Notes (Signed)
MRN : [id number]  [name] is a 75 y.o. (12-Oct-1940) female who presents with chief complaint of  Chief Complaint  Patient presents with  . Follow-up  .  History of Present Illness:  Patient is seen for follow up evaluation of leg pain and swelling associated with venous ulceration.  The pain and swelling worsens with prolonged dependency and improves with elevation. The pain is unrelated to activity.  The patient notes that in the morning the legs are better but the leg symptoms worsened throughout the course of the day. The patient has also noted a progressive worsening of the discoloration in the ankle and shin area.   The patient has been wearing graduated compression prior to being seen in this office.    Venous ultrasound shows reflux in the right small saphenous vein       Current Outpatient Prescriptions  Medication Sig Dispense Refill  . acidophilus (RISAQUAD) CAPS capsule Take 1 capsule by mouth daily.    Marland Kitchen ADVAIR DISKUS 250-50 MCG/DOSE AEPB USE 1 INHALATION EVERY 12 HOURS 180 each 0  . albuterol (PROVENTIL) (2.5 MG/3ML) 0.083% nebulizer solution Take 2.5 mg by nebulization every 6 (six) hours as needed for wheezing or shortness of breath.    Marland Kitchen alendronate (FOSAMAX) 70 MG tablet Take by mouth.    . ALPRAZolam (XANAX) 0.25 MG tablet Take 1 tablet (0.25 mg total) by mouth daily as needed for anxiety. 90 tablet 0  . aspirin EC 81 MG tablet Take 81 mg by mouth daily.    . Calcium Carbonate-Vitamin D (CALCIUM 600+D) 600-400 MG-UNIT tablet Take 1 tablet by mouth 2 (two) times daily.    . cetirizine (ZYRTEC) 10 MG tablet TAKE 1 TABLET DAILY AS NEEDED FOR ALLERGIES 30 tablet 1  . chlorpheniramine-HYDROcodone (TUSSIONEX) 10-8 MG/5ML SUER Take 5 mLs by mouth every 12 (twelve) hours as needed for cough. 140 mL 0  . cholecalciferol (VITAMIN D) 1000 units tablet Take 1,000 Units by mouth daily.    . Diphenhyd-Hydrocort-Nystatin (FIRST-DUKES MOUTHWASH) SUSP Pt is to swish and spit  5 cc's tid 237 mL 0  . FeFum-FePo-FA-B Cmp-C-Zn-Mn-Cu (TANDEM PLUS) 162-115.2-1 MG CAPS Take 1 capsule by mouth daily.    . fluticasone (FLONASE) 50 MCG/ACT nasal spray Place 2 sprays into both nostrils daily. 16 g 6  . furosemide (LASIX) 20 MG tablet Take 1 tablet (20 mg total) by mouth daily. 90 tablet 0  . gabapentin (NEURONTIN) 300 MG capsule Take 300 mg by mouth 4 (four) times daily.    Marland Kitchen guaiFENesin-dextromethorphan (ROBITUSSIN DM) 100-10 MG/5ML syrup Take 5 mLs by mouth every 4 (four) hours as needed for cough. 118 mL 0  . hydroxychloroquine (PLAQUENIL) 200 MG tablet Take 1 tablet (200 mg total) by mouth 2 (two) times daily. 180 tablet 1  . Multiple Vitamin (MULTIVITAMIN WITH MINERALS) TABS tablet Take 1 tablet by mouth daily.    Marland Kitchen nystatin cream (MYCOSTATIN) Apply 1 application topically 2 (two) times daily. Apply to affected area (outer vagina) 30 g 3  . potassium chloride (MICRO-K) 10 MEQ CR capsule Take 20 mEq by mouth 2 (two) times daily.    . pramipexole (MIRAPEX) 0.25 MG tablet Take 2 tablets (0.5 mg total) by mouth 2 (two) times daily. 180 tablet 2  . predniSONE (DELTASONE) 5 MG tablet Take 6 tablets (30mg )  x 1 day and then decrease by 1 tablet each day until down to zero mg. 21 tablet 0  . PROAIR HFA 108 (90 Base) MCG/ACT  inhaler USE 2 INHALATIONS EVERY 4 HOURS AS NEEDED FOR WHEEZING OR SHORTNESS OF BREATH 25.5 g 0  . RABEprazole (ACIPHEX) 20 MG tablet Take 1 tablet (20 mg total) by mouth 2 (two) times daily before a meal. 60 tablet 1  . simvastatin (ZOCOR) 20 MG tablet TAKE 1 TABLET DAILY 90 tablet 3  . sodium chloride (OCEAN) 0.65 % nasal spray Place 1 spray into the nose as needed for congestion.    . TOPROL XL 50 MG 24 hr tablet TAKE 1 TABLET DAILY. TAKE WITH OR IMMEDIATELY FOLLOWING A MEAL 90 tablet 1  . traMADol (ULTRAM) 50 MG tablet Take 50 mg by mouth 2 (two) times daily as needed for moderate pain.    . colestipol (COLESTID) 1 g tablet Take 2 g by mouth 2 (two) times  daily.    . Fluticasone-Salmeterol (ADVAIR) 250-50 MCG/DOSE AEPB Inhale 1 puff into the lungs 2 (two) times daily.    . furosemide (LASIX) 40 MG tablet Take 40 mg by mouth. Dr [name]    . predniSONE (DELTASONE) 10 MG tablet Take 1 tablet (10 mg total) by mouth daily. Day 1: take 60mg  PO.  Decrease by 10mg  each day until Rx is gone. (Patient not taking: Reported on 04/25/2016) 21 tablet 0   No current facility-administered medications for this visit.     Past Medical History:  Diagnosis Date  . Anemia   . Diverticulitis   . GERD (gastroesophageal reflux disease)   . Hyperlipidemia   . Hypertension   . Osteoarthritis   . Osteoporosis    actonel  . Positive PPD    s/p INH (2006)  . Rheumatoid arthritis(714.0)    MTX transaminitis, Leflunomide (rash), enbrel, plaquinil, prednisone, remicade, Imuran (transaminitis)  . Valvular heart disease    moderate MR and TR    Past Surgical History:  Procedure Laterality Date  . ABDOMINAL HYSTERECTOMY    . CERVICAL CONE BIOPSY    . CHOLECYSTECTOMY  06/22/14  . COLONOSCOPY WITH PROPOFOL N/A 07/09/2015   Procedure: COLONOSCOPY WITH PROPOFOL;  Surgeon: Kaitlyn Silvas, MD;  Location: [facility] ENDOSCOPY;  Service: Endoscopy;  Laterality: N/A;  . ESOPHAGOGASTRODUODENOSCOPY (EGD) WITH PROPOFOL N/A 07/09/2015   Procedure: ESOPHAGOGASTRODUODENOSCOPY (EGD) WITH PROPOFOL;  Surgeon: Kaitlyn Silvas, MD;  Location: [facility] ENDOSCOPY;  Service: Endoscopy;  Laterality: N/A;  . OVARY SURGERY    . TRACHEOSTOMY  1959  . TUBAL LIGATION      Social History Social History  Substance Use Topics  . Smoking status: Never Smoker  . Smokeless tobacco: Never Used  . Alcohol use No     Family History Family History  Problem Relation Age of Onset  . Heart disease Father     MI  . Heart disease Mother   . Valvular heart disease Mother   . Breast cancer Sister 55  . Colon cancer Neg Hx      Allergies  Allergen Reactions  . Astelin [Azelastine Hcl] Other  (See Comments)    Reaction:  Unknown   . Ciprofloxacin Other (See Comments)    Reaction:  GI upset   . Codeine Other (See Comments)    Reaction:  Altered mental status  . Dilaudid [Hydromorphone] Other (See Comments)    Reaction:  Unknown   . Flexeril [Cyclobenzaprine] Other (See Comments)    Reaction:  Unknown   . Imuran [Azathioprine] Other (See Comments)    Reaction:  Abnormal liver function  . Keflex [Cephalexin] Other (See Comments)    Reaction:  GI upset   . Lisinopril Itching  . Lyrica [Pregabalin] Other (See Comments)    Reaction:  Sore gums   . Methotrexate Derivatives Other (See Comments)    Reaction:  Abnormal liver function  . Amoxicillin Rash and Other (See Comments)    Unable to obtain enough information to answer additional questions about this medication.    Jolee Ewing [Leflunomide] Rash  . Clindamycin/Lincomycin Rash  . Doxycycline Rash  . Lodine [Etodolac] Rash  . Percocet [Oxycodone-Acetaminophen] Rash  . Sulfa Antibiotics Rash     REVIEW OF SYSTEMS (Negative unless checked)  Constitutional: [] Weight loss  [] Fever  [] Chills Cardiac: [] Chest pain   [] Chest pressure   [] Palpitations   [] Shortness of breath at rest   [] Shortness of breath with exertion. Vascular:  [] Pain in legs with walking   [] Pain in legs at rest    [] Pain in feet at rest    [] History of DVT   [] Phlebitis   [x] Swelling in legs   [x] Varicose veins   [] Non-healing ulcers Pulmonary:   [] Uses home oxygen   [] Productive cough   [] Hemoptysis   [] Wheeze  [] COPD   [] Asthma Neurologic:  [] Dizziness  [] Blackouts   [] Seizures   [] History of stroke   [] History of TIA  [] Aphasia   [] Temporary blindness   [] Dysphagia   [] Weakness or numbness in arm   [] Weakness or numbness in leg Musculoskeletal:  [] Arthritis   [] Joint swelling   [] Joint pain   [] Low back pain Hematologic:  [] Easy bruising  [] Easy bleeding   [] Hypercoagulable state   [] Anemic   Gastrointestinal:  [] Blood in stool   [] Vomiting blood   [] Gastroesophageal reflux/heartburn   [] Difficulty swallowing. Genitourinary:  [] Chronic kidney disease   [] Difficult urination  [] Frequent urination  [] Burning with urination   [] Blood in urine Skin:  [] Rashes   [] Ulcers   Psychological:  [] History of anxiety   []  History of major depression.    Physical Examination  Vitals:   04/25/16 1453  BP: 129/74  Pulse: (!) 57  Resp: 16  Weight: 170 lb (77.1 kg)  Height: 5\' 4"  (1.626 m)   Body mass index is 29.18 kg/m. Gen:  WD/WN, NAD Head: [place]/AT, No temporalis wasting. Ear/Nose/Throat: Hearing grossly intact, nares w/o erythema or drainage, trachea midline Eyes: PERR, EOM appear normal. Sclera non-icteric Neck: Supple, no nuchal rigidity.  No bruit or JVD.  Pulmonary:  Good air movement, equal and clear to auscultation bilaterally.  Cardiac: RRR, normal S1, S2, no Murmurs, rubs or gallops. Vascular:   Varicose veins of the right leg with moderate venous stasis changes Vessel Right Left  Radial Palpable Palpable  Ulnar Palpable Palpable  Brachial Palpable Palpable  Carotid Palpable Palpable  Aorta Not palpable N/A  Femoral Palpable Palpable  Popliteal Palpable Palpable  PT Palpable Palpable  DP Palpable Palpable   Gastrointestinal: soft, non-rigid/non-distended. No guarding.  Musculoskeletal: M/S 5/5 throughout.  No deformity or atrophy. Neurologic: CN 2-12 intact. Pain and light touch intact in extremities.  Speech is fluent. Motor exam as listed above. Psychiatric: Judgment intact, Mood & affect appropriate for pt's clinical situation. Dermatologic: No rashes or ulcers noted.  No signs consistent with cellulitis. Lymphatic:  No cervical lymphadenopathy  CBC Lab Results  Component Value Date   WBC 12.2 (H) 03/21/2016   HGB 12.5 03/21/2016   HCT 35.7 (L) 03/21/2016   MCV 91.4 03/21/2016   PLT 197.0 03/21/2016    BMET    Component Value Date/Time   NA 135 03/21/2016 1633  NA 140 11/03/2014 0441   K 3.9 03/21/2016  1633   K 3.7 11/03/2014 0441   CL 97 03/21/2016 1633   CL 108 11/03/2014 0441   CO2 30 03/21/2016 1633   CO2 23 11/03/2014 0441   GLUCOSE 138 (H) 03/21/2016 1633   GLUCOSE 133 (H) 11/03/2014 0441   BUN 12 03/21/2016 1633   BUN 16 11/03/2014 0441   CREATININE 1.01 03/21/2016 1633   CREATININE 1.35 (H) 12/18/2015 1603   CALCIUM 8.8 03/21/2016 1633   CALCIUM 7.6 (L) 11/03/2014 0441   GFRNONAA 58 (L) 02/07/2016 1031   GFRNONAA >60 11/03/2014 0441   GFRAA >60 02/07/2016 1031   GFRAA >60 11/03/2014 0441   CrCl cannot be calculated (Patient's most recent lab result is older than the maximum 21 days allowed.).  COAG Lab Results  Component Value Date   INR 1.07 08/03/2015   INR 1.0 11/01/2014    Radiology No results found.    Assessment/Plan 1. Varicose veins of both lower extremities with inflammation Recommend  I have reviewed my previous  discussion with the patient regarding  varicose veins and why they cause symptoms. Patient will continue  wearing graduated compression stockings class 1 on a daily basis, beginning first thing in the morning and removing them in the evening.    In addition, behavioral modification including elevation during the day was again discussed and this will continue.  The patient has utilized over the counter pain medications and has been exercising.  However, at this time conservative therapy has not alleviated the patient's symptoms of leg pain and swelling  Recommend: laser ablation of the right small saphenous vein to eliminate the symptoms of pain and swelling of the lower extremities caused by the severe superficial venous reflux disease.       2. Chronic venous insufficiency  I have reviewed my previous discussion with the patient regarding swelling and why it  causes symptoms.  The patient is doing well with compression and will continue wearing graduated compression stockings on a daily basis a prescription was given. The patient will   beginning wearing the stockings first thing in the morning and removing them in the evening. The patient is instructed specifically not to sleep in the stockings.  In addition, behavioral modification including elevation during the day will be initiated.    3. Pain in both lower extremities See plan for #1  4. Swelling of limb See plan for #2    Kaitlyn Pilar, MD  04/30/2016 2:00 PM    This note was created with Dragon medical transcription system.  Any errors from dictation are purely unintentional

## 2016-05-09 ENCOUNTER — Other Ambulatory Visit: Payer: Self-pay | Admitting: Internal Medicine

## 2016-05-09 MED ORDER — ALPRAZOLAM 0.25 MG PO TABS: 0.2500 mg | ORAL_TABLET | Freq: Every day | ORAL | 0 refills | Status: DC | PRN

## 2016-05-09 NOTE — Telephone Encounter (Signed)
Pt called requesting a refill on ALPRAZolam (XANAX) 0.25 MG tablet. Thank you!  Pharmacy - [place], [place]  Call pt @ [phone]

## 2016-05-09 NOTE — Telephone Encounter (Signed)
Filled 02/12/16 90 0 rf

## 2016-05-10 NOTE — Telephone Encounter (Signed)
Will fax once signed.

## 2016-05-12 ENCOUNTER — Ambulatory Visit (INDEPENDENT_AMBULATORY_CARE_PROVIDER_SITE_OTHER): Payer: Medicare Other

## 2016-05-12 VITALS — BP 118/64 | HR 60 | Temp 98.3°F | Resp 12 | Ht 64.0 in | Wt 172.8 lb

## 2016-05-12 DIAGNOSIS — Z0000 Encounter for general adult medical examination without abnormal findings: Secondary | ICD-10-CM | POA: Diagnosis not present

## 2016-05-12 NOTE — Progress Notes (Signed)
Subjective:   Kaitlyn Huff is a 75 y.o. female who presents for an Initial Medicare Annual Wellness Visit.  Review of Systems    No ROS.  Medicare Wellness Visit.  Cardiac Risk Factors include: advanced age (>74men, >64 women);hypertension     Objective:    Today's Vitals   05/12/16 1701  BP: 118/64  Pulse: 60  Resp: 12  Temp: 98.3 F (36.8 C)  TempSrc: Oral  SpO2: 95%  Weight: 172 lb 12.8 oz (78.4 kg)  Height: 5\' 4"  (1.626 m)   Body mass index is 29.66 kg/m.   Current Medications (verified) Outpatient Encounter Prescriptions as of 05/12/2016  Medication Sig  . ADVAIR DISKUS 250-50 MCG/DOSE AEPB USE 1 INHALATION EVERY 12 HOURS  . albuterol (PROVENTIL) (2.5 MG/3ML) 0.083% nebulizer solution Take 2.5 mg by nebulization every 6 (six) hours as needed for wheezing or shortness of breath.  Marland Kitchen alendronate (FOSAMAX) 70 MG tablet Take by mouth.  . ALPRAZolam (XANAX) 0.25 MG tablet Take 1 tablet (0.25 mg total) by mouth daily as needed for anxiety.  Marland Kitchen aspirin EC 81 MG tablet Take 81 mg by mouth daily.  . Calcium Carbonate-Vitamin D (CALCIUM 600+D) 600-400 MG-UNIT tablet Take 1 tablet by mouth 2 (two) times daily.  . cetirizine (ZYRTEC) 10 MG tablet TAKE 1 TABLET DAILY AS NEEDED FOR ALLERGIES  . chlorpheniramine-HYDROcodone (TUSSIONEX) 10-8 MG/5ML SUER Take 5 mLs by mouth every 12 (twelve) hours as needed for cough.  . cholecalciferol (VITAMIN D) 1000 units tablet Take 1,000 Units by mouth daily.  . colestipol (COLESTID) 1 g tablet Take 2 g by mouth 2 (two) times daily.  . Diphenhyd-Hydrocort-Nystatin (FIRST-DUKES MOUTHWASH) SUSP Pt is to swish and spit 5 cc's tid  . FeFum-FePo-FA-B Cmp-C-Zn-Mn-Cu (TANDEM PLUS) 162-115.2-1 MG CAPS Take 1 capsule by mouth daily.  . fluticasone (FLONASE) 50 MCG/ACT nasal spray Place 2 sprays into both nostrils daily.  . Fluticasone-Salmeterol (ADVAIR) 250-50 MCG/DOSE AEPB Inhale 1 puff into the lungs 2 (two) times daily.  . furosemide (LASIX) 20 MG  tablet Take 1 tablet (20 mg total) by mouth daily.  . furosemide (LASIX) 40 MG tablet Take 40 mg by mouth. Dr [name]  . gabapentin (NEURONTIN) 300 MG capsule Take 300 mg by mouth 4 (four) times daily.  Marland Kitchen guaiFENesin-dextromethorphan (ROBITUSSIN DM) 100-10 MG/5ML syrup Take 5 mLs by mouth every 4 (four) hours as needed for cough.  . hydroxychloroquine (PLAQUENIL) 200 MG tablet Take 1 tablet (200 mg total) by mouth 2 (two) times daily.  . Multiple Vitamin (MULTIVITAMIN WITH MINERALS) TABS tablet Take 1 tablet by mouth daily.  Marland Kitchen nystatin cream (MYCOSTATIN) Apply 1 application topically 2 (two) times daily. Apply to affected area (outer vagina)  . potassium chloride (MICRO-K) 10 MEQ CR capsule Take 20 mEq by mouth 2 (two) times daily.  . pramipexole (MIRAPEX) 0.25 MG tablet Take 2 tablets (0.5 mg total) by mouth 2 (two) times daily.  . predniSONE (DELTASONE) 10 MG tablet Take 1 tablet (10 mg total) by mouth daily. Day 1: take 60mg  PO.  Decrease by 10mg  each day until Rx is gone.  . predniSONE (DELTASONE) 5 MG tablet Take 6 tablets (30mg )  x 1 day and then decrease by 1 tablet each day until down to zero mg.  . PROAIR HFA 108 (90 Base) MCG/ACT inhaler USE 2 INHALATIONS EVERY 4 HOURS AS NEEDED FOR WHEEZING OR SHORTNESS OF BREATH  . simvastatin (ZOCOR) 20 MG tablet TAKE 1 TABLET DAILY  . sodium chloride (OCEAN) 0.65 %  nasal spray Place 1 spray into the nose as needed for congestion.  . TOPROL XL 50 MG 24 hr tablet TAKE 1 TABLET DAILY. TAKE WITH OR IMMEDIATELY FOLLOWING A MEAL  . traMADol (ULTRAM) 50 MG tablet Take 50 mg by mouth 2 (two) times daily as needed for moderate pain.  Marland Kitchen acidophilus (RISAQUAD) CAPS capsule Take 1 capsule by mouth daily.  . RABEprazole (ACIPHEX) 20 MG tablet Take 1 tablet (20 mg total) by mouth 2 (two) times daily before a meal. (Patient not taking: Reported on 05/12/2016)   No facility-administered encounter medications on file as of 05/12/2016.     Allergies  (verified) Astelin [azelastine hcl]; Ciprofloxacin; Codeine; Dilaudid [hydromorphone]; Flexeril [cyclobenzaprine]; Imuran [azathioprine]; Keflex [cephalexin]; Lisinopril; Lyrica [pregabalin]; Methotrexate derivatives; Amoxicillin; Arava [leflunomide]; Clindamycin/lincomycin; Doxycycline; Lodine [etodolac]; Percocet [oxycodone-acetaminophen]; and Sulfa antibiotics   History: Past Medical History:  Diagnosis Date  . Anemia   . Diverticulitis   . GERD (gastroesophageal reflux disease)   . Hyperlipidemia   . Hypertension   . Osteoarthritis   . Osteoporosis    actonel  . Positive PPD    s/p INH (2006)  . Rheumatoid arthritis(714.0)    MTX transaminitis, Leflunomide (rash), enbrel, plaquinil, prednisone, remicade, Imuran (transaminitis)  . 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 ABDOMINAL HYSTERECTOMY    . CERVICAL CONE BIOPSY    . CHOLECYSTECTOMY  06/22/14  . COLONOSCOPY WITH PROPOFOL N/A 07/09/2015   Procedure: COLONOSCOPY WITH PROPOFOL;  Surgeon: Manya Silvas, MD;  Location: [facility] ENDOSCOPY;  Service: Endoscopy;  Laterality: N/A;  . ESOPHAGOGASTRODUODENOSCOPY (EGD) WITH PROPOFOL N/A 07/09/2015   Procedure: ESOPHAGOGASTRODUODENOSCOPY (EGD) WITH PROPOFOL;  Surgeon: Manya Silvas, MD;  Location: [facility] ENDOSCOPY;  Service: Endoscopy;  Laterality: N/A;  . OVARY SURGERY    . TRACHEOSTOMY  1959  . TUBAL LIGATION     Family History  Problem Relation Age of Onset  . Heart disease Father     MI  . Heart disease Mother   . Valvular heart disease Mother   . Breast cancer Sister 31  . Cancer Sister     Lung cancer  . Colon cancer Neg Hx    Social History   Occupational History  . retired    Social History Main Topics  . Smoking status: Never Smoker  . Smokeless tobacco: Never Used  . Alcohol use No  . Drug use: No  . Sexual activity: No    Tobacco Counseling Counseling given: Not Answered   Activities of  Daily Living In your present state of health, do you have any difficulty performing the following activities: 05/12/2016 08/18/2015  Hearing? N -  Vision? N -  Difficulty concentrating or making decisions? N -  Walking or climbing stairs? Y -  Dressing or bathing? N -  Doing errands, shopping? N N  Preparing Food and eating ? N -  Using the Toilet? N -  In the past six months, have you accidently leaked urine? N -  Do you have problems with loss of bowel control? N -  Managing your Medications? N -  Managing your Finances? N -  Housekeeping or managing your Housekeeping? N -  Some recent data might be hidden    Immunizations and Health Maintenance Immunization History  Administered Date(s) Administered  . Influenza Split 05/17/2012, 03/03/2014  . Influenza,inj,Quad PF,36+ Mos 03/25/2013, 08/05/2015   Health Maintenance Due  Topic Date Due  . ZOSTAVAX  04/29/2003  . PNA vac Low Risk Adult (1 of 2 - PCV13) 04/28/2008    Patient Care Team: [name], MD as PCP - General (Internal Medicine)  Indicate any recent Medical Services you may have received from other than [facility] providers in the past year (date may be approximate).     Assessment:   This is a routine wellness examination for Kili. The goal of the wellness visit is to assist the patient how to close the gaps in care and create a preventative care plan for the patient.   Taking calcium VIT D as appropriate/Osteoporosis risk reviewed.  DEXA Scan completed.   See results in care everywhere.  Medications reviewed; taking without issues or barriers.  Safety issues reviewed; lives alone.  Alarm system, smoke and carbon monoxide detectors in the home. Firearm locked in a safe within the home. Wears seatbelts when driving or riding with others. No violence in the home.  No identified risk were noted; The patient was oriented x 3; appropriate in dress and manner and no objective failures at ADL's or IADL's.   Body  mass index; discussed the importance of a healthy diet, water intake and exercise. Educational material provided.  Influenza, Prevnar 13 and ZOSTAVAX vaccine deferred per patient request.  Educational material provided.  Patient Concerns: Continued loose BM.  Taking OTC probiotic.  Plans to follow up with GI (Dr. Tiffany Kocher).  Encouraged to follow up with PCP as needed.  Hearing/Vision screen Hearing Screening Comments: Passes the whisper test Vision Screening Comments: Followed by Dr. Gloriann Loan Bilateral cataracts extracted Last OV 04/2016  Dietary issues and exercise activities discussed: Current Exercise Habits: The patient does not participate in regular exercise at present  Goals    . Increase physical activity          Chair exercises as demonstrated.  Increase as tolerated.      Depression Screen PHQ 2/9 Scores 05/12/2016 03/21/2016 04/14/2015 07/30/2014 01/08/2014  PHQ - 2 Score 0 0 3 0 1  PHQ- 9 Score - - 11 - -    Fall Risk Fall Risk  05/12/2016 03/21/2016 04/14/2015 07/30/2014 01/08/2014  Falls in the past year? No No No Yes No  Number falls in past yr: - - - 2 or more -  Injury with Fall? - - - No -  Risk for fall due to : - - - Other (Comment) -  Risk for fall due to (comments): - - - gallbladder attack -    Cognitive Function: MMSE - Mini Mental State Exam 05/12/2016  Orientation to time 5  Orientation to Place 5  Registration 3  Attention/ Calculation 5  Recall 3  Language- name 2 objects 2  Language- repeat 1  Language- follow 3 step command 3  Language- read & follow direction 1  Write a sentence 1  Copy design 1  Total score 30        Screening Tests Health Maintenance  Topic Date Due  . ZOSTAVAX  04/29/2003  . PNA vac Low Risk Adult (1 of 2 - PCV13) 04/28/2008  . INFLUENZA VACCINE  10/07/2016 (Originally 02/09/2016)  . MAMMOGRAM  02/15/2018  . TETANUS/TDAP  01/20/2019  . COLONOSCOPY  07/08/2020  . DEXA SCAN  Completed      Plan:    End of life  planning; Advance aging; Advanced directives discussed. Copy of current HCPOA/Living Will requested.  Medicare Attestation I have personally reviewed: The patient's medical and social history Their use of alcohol, tobacco or illicit  drugs Their current medications and supplements The patient's functional ability including ADLs,fall risks, home safety risks, cognitive, and hearing and visual impairment Diet and physical activities Evidence for depression   The patient's weight, height, BMI, and visual acuity have been recorded in the chart.  I have made referrals and provided education to the patient based on review of the above and I have provided the patient with a written personalized care plan for preventive services.    During the course of the visit, Khristen was educated and counseled about the following appropriate screening and preventive services:   Vaccines to include Pneumoccal, Influenza, Hepatitis B, Td, Zostavax, HCV  Electrocardiogram  Cardiovascular disease screening  Colorectal cancer screening  Bone density screening  Diabetes screening  Glaucoma screening  Mammography/PAP  Nutrition counseling  Smoking cessation counseling  Patient Instructions (the written plan) were given to the patient.    Varney Biles, LPN   [id number]    Reviewed above information.  Agree with f/u with GI.  Agree with plan.    Dr Nicki Reaper

## 2016-05-12 NOTE — Patient Instructions (Addendum)
  Ms. Prior , Thank you for taking time to come for your Medicare Wellness Visit. I appreciate your ongoing commitment to your health goals. Please review the following plan we discussed and let me know if I can assist you in the future.   FOLLOW UP WITH DR. Nicki Reaper AS NEEDED.  These are the goals we discussed: Goals    . Increase physical activity          Chair exercises as demonstrated.  Increase as tolerated.       This is a list of the screening recommended for you and due dates:  Health Maintenance  Topic Date Due  . Shingles Vaccine  04/29/2003  . Pneumonia vaccines (1 of 2 - PCV13) 04/28/2008  . Flu Shot  10/07/2016*  . Mammogram  02/15/2018  . Tetanus Vaccine  01/20/2019  . Colon Cancer Screening  07/08/2020  . DEXA scan (bone density measurement)  Completed  *Topic was postponed. The date shown is not the original due date.

## 2016-05-13 ENCOUNTER — Other Ambulatory Visit: Payer: Self-pay | Admitting: Internal Medicine

## 2016-05-13 NOTE — Telephone Encounter (Signed)
Last filled by Dr.Sonnenburg

## 2016-05-29 ENCOUNTER — Other Ambulatory Visit: Payer: Self-pay | Admitting: Internal Medicine

## 2016-05-31 ENCOUNTER — Telehealth: Payer: Self-pay | Admitting: Internal Medicine

## 2016-05-31 ENCOUNTER — Other Ambulatory Visit: Payer: Self-pay

## 2016-05-31 DIAGNOSIS — D649 Anemia, unspecified: Secondary | ICD-10-CM

## 2016-05-31 DIAGNOSIS — M069 Rheumatoid arthritis, unspecified: Secondary | ICD-10-CM

## 2016-05-31 DIAGNOSIS — I10 Essential (primary) hypertension: Secondary | ICD-10-CM

## 2016-05-31 DIAGNOSIS — E7800 Pure hypercholesterolemia, unspecified: Secondary | ICD-10-CM

## 2016-05-31 MED ORDER — CETIRIZINE HCL 10 MG PO TABS: 10.0000 mg | ORAL_TABLET | Freq: Every day | ORAL | 1 refills | Status: DC

## 2016-05-31 MED ORDER — TIOTROPIUM BROMIDE MONOHYDRATE 18 MCG IN CAPS: 18.0000 ug | ORAL_CAPSULE | Freq: Every day | RESPIRATORY_TRACT | 0 refills | Status: DC

## 2016-05-31 MED ORDER — TANDEM PLUS 162-115.2-1 MG PO CAPS: 1.0000 | ORAL_CAPSULE | Freq: Every day | ORAL | 0 refills | Status: DC

## 2016-05-31 MED ORDER — FUROSEMIDE 20 MG PO TABS: 20.0000 mg | ORAL_TABLET | Freq: Every day | ORAL | 0 refills | Status: DC

## 2016-05-31 NOTE — Telephone Encounter (Signed)
I have sent in lasix and zyrtec, I do not see others in chart. Ok to fill?

## 2016-05-31 NOTE — Telephone Encounter (Signed)
This is her iron supplement and is on her list.  Ok to refill x 1.  I would like to check her iron studies.  Schedule non fasting labs within the next couple of weeks.  We will see if she still needs to be on the iron.

## 2016-05-31 NOTE — Telephone Encounter (Signed)
Refills sent,patient notified and lab scheduled. Please place lab order

## 2016-05-31 NOTE — Telephone Encounter (Signed)
What about spiriva

## 2016-05-31 NOTE — Telephone Encounter (Signed)
Ok to refill x 2  

## 2016-05-31 NOTE — Telephone Encounter (Signed)
Pt called requesting a refill on Spiriva (received that in hospital), cetirizine (ZYRTEC) 10 MG tablet, furosemide (LASIX) 20 MG tablet, and Tamdam plus 160/1 vitamins. Pt is completely out of Zytrec, and running low on lasix Thank you!  Pharmacy - [facility], [place]  Call pt @ [phone]

## 2016-06-01 NOTE — Telephone Encounter (Signed)
Orders placed for labs.  Please notify pt that the labs are fasting.  I am also going to check her cholesterol.  Thanks.

## 2016-06-01 NOTE — Telephone Encounter (Signed)
Patient notified and earlier appmt scheduled

## 2016-06-06 ENCOUNTER — Ambulatory Visit (INDEPENDENT_AMBULATORY_CARE_PROVIDER_SITE_OTHER): Payer: Medicare Other | Admitting: Family

## 2016-06-06 ENCOUNTER — Encounter: Payer: Self-pay | Admitting: Family

## 2016-06-06 VITALS — BP 132/70 | HR 66 | Temp 98.5°F | Ht 64.0 in | Wt 172.0 lb

## 2016-06-06 DIAGNOSIS — J209 Acute bronchitis, unspecified: Secondary | ICD-10-CM | POA: Diagnosis not present

## 2016-06-06 DIAGNOSIS — I208 Other forms of angina pectoris: Secondary | ICD-10-CM | POA: Diagnosis not present

## 2016-06-06 LAB — POCT INFLUENZA A/B
INFLUENZA A, POC: NEGATIVE
INFLUENZA B, POC: NEGATIVE

## 2016-06-06 LAB — POCT RAPID STREP A (OFFICE): Rapid Strep A Screen: NEGATIVE

## 2016-06-06 MED ORDER — FIRST-DUKES MOUTHWASH MT SUSP: OROMUCOSAL | 0 refills | Status: DC

## 2016-06-06 NOTE — Patient Instructions (Addendum)
Suspect viral illness.    Will do conservative therapy for next couple of days and if not better, please let me know.   Increase intake of clear fluids. Congestion is best treated by hydration, when mucus is wetter, it is thinner, less sticky, and easier to expel from the body, either through coughing up drainage, or by blowing your nose.   Get plenty of rest.   Use saline nasal drops and blow your nose frequently. Run a humidifier at night and elevate the head of the bed. Vicks Vapor rub will help with congestion and cough. Steam showers and sinus massage for congestion.   Use Acetaminophen or Ibuprofen as needed for fever or pain. Avoid second hand smoke. Even the smallest exposure will worsen symptoms.   Over the counter medications you can try include Delsym for cough, a decongestant for congestion, and Mucinex or Robitussin as an expectorant. Be sure to just get the plain Mucinex or Robitussin that just has one medication (Guaifenesen). We don't recommend the combination products. Note, be sure to drink two glasses of water with each dose of Mucinex as the medication will not work well without adequate hydration.   You can also try a teaspoon of honey to see if this will help reduce cough. Throat lozenges can sometimes be beneficial as well.    This illness will typically last 7 - 10 days.   Please follow up with our clinic if you develop a fever greater than 101 F, symptoms worsen, or do not resolve in the next week.

## 2016-06-06 NOTE — Progress Notes (Signed)
Pre visit review using our clinic review tool, if applicable. No additional management support is needed unless otherwise documented below in the visit note. 

## 2016-06-06 NOTE — Progress Notes (Signed)
Subjective:    Patient ID: Kaitlyn Huff, female    DOB: 09-18-1940, 75 y.o.   MRN: [id number]  CC: Kaitlyn Huff is a 75 y.o. female who presents today for an acute visit.    HPI: CC: dry cough, nasal congestion started yesterday. Endorses Tmax 101 today, chills, wheezing. Has robitussin with relief. Hasn't taken any tyleonol.   Some SOB which is at baseline for her, not worsening.   H/o PNA, OSA.   Wears o2 at night, not cipap. Noticed in the morning for the past 3 days has white film on tongue and requesting refill of [facility] mouthwash. Using mouthwash [facility]'s with improvement. No bleeding lesions.      HISTORY:  Past Medical History:  Diagnosis Date  . Anemia   . Diverticulitis   . GERD (gastroesophageal reflux disease)   . Hyperlipidemia   . Hypertension   . Osteoarthritis   . Osteoporosis    actonel  . Positive PPD    s/p INH (2006)  . Rheumatoid arthritis(714.0)    MTX transaminitis, Leflunomide (rash), enbrel, plaquinil, prednisone, remicade, Imuran (transaminitis)  . 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 ABDOMINAL HYSTERECTOMY    . CERVICAL CONE BIOPSY    . CHOLECYSTECTOMY  06/22/14  . COLONOSCOPY WITH PROPOFOL N/A 07/09/2015   Procedure: COLONOSCOPY WITH PROPOFOL;  Surgeon: Manya Silvas, MD;  Location: [facility] ENDOSCOPY;  Service: Endoscopy;  Laterality: N/A;  . ESOPHAGOGASTRODUODENOSCOPY (EGD) WITH PROPOFOL N/A 07/09/2015   Procedure: ESOPHAGOGASTRODUODENOSCOPY (EGD) WITH PROPOFOL;  Surgeon: Manya Silvas, MD;  Location: [facility] ENDOSCOPY;  Service: Endoscopy;  Laterality: N/A;  . OVARY SURGERY    . TRACHEOSTOMY  1959  . TUBAL LIGATION     Family History  Problem Relation Age of Onset  . Heart disease Father     MI  . Heart disease Mother   . Valvular heart disease Mother   . Breast cancer Sister 56  . Cancer Sister     Lung cancer  . Colon cancer Neg Hx     Allergies: Astelin  [azelastine hcl]; Ciprofloxacin; Codeine; Dilaudid [hydromorphone]; Flexeril [cyclobenzaprine]; Imuran [azathioprine]; Keflex [cephalexin]; Lisinopril; Lyrica [pregabalin]; Methotrexate derivatives; Amoxicillin; Arava [leflunomide]; Clindamycin/lincomycin; Doxycycline; Lodine [etodolac]; Percocet [oxycodone-acetaminophen]; and Sulfa antibiotics Current Outpatient Prescriptions on File Prior to Visit  Medication Sig Dispense Refill  . acidophilus (RISAQUAD) CAPS capsule Take 1 capsule by mouth daily.    Marland Kitchen ADVAIR DISKUS 250-50 MCG/DOSE AEPB USE 1 INHALATION EVERY 12 HOURS 180 each 0  . albuterol (PROVENTIL) (2.5 MG/3ML) 0.083% nebulizer solution Take 2.5 mg by nebulization every 6 (six) hours as needed for wheezing or shortness of breath.    Marland Kitchen alendronate (FOSAMAX) 70 MG tablet Take by mouth.    . ALPRAZolam (XANAX) 0.25 MG tablet Take 1 tablet (0.25 mg total) by mouth daily as needed for anxiety. 90 tablet 0  . aspirin EC 81 MG tablet Take 81 mg by mouth daily.    . Calcium Carbonate-Vitamin D (CALCIUM 600+D) 600-400 MG-UNIT tablet Take 1 tablet by mouth 2 (two) times daily.    . cetirizine (ZYRTEC) 10 MG tablet Take 1 tablet (10 mg total) by mouth daily. 90 tablet 1  . chlorpheniramine-HYDROcodone (TUSSIONEX) 10-8 MG/5ML SUER Take 5 mLs by mouth every 12 (twelve) hours as needed for cough. 140 mL 0  . cholecalciferol (VITAMIN D) 1000 units tablet Take 1,000 Units by mouth daily.    Marland Kitchen  colestipol (COLESTID) 1 g tablet Take 2 g by mouth 2 (two) times daily.    Marland Kitchen FeFum-FePo-FA-B Cmp-C-Zn-Mn-Cu (TANDEM PLUS) 162-115.2-1 MG CAPS Take 1 capsule by mouth daily. 90 each 0  . fluticasone (FLONASE) 50 MCG/ACT nasal spray Place 2 sprays into both nostrils daily. 16 g 6  . Fluticasone-Salmeterol (ADVAIR) 250-50 MCG/DOSE AEPB Inhale 1 puff into the lungs 2 (two) times daily.    . furosemide (LASIX) 20 MG tablet Take 1 tablet (20 mg total) by mouth daily. 90 tablet 0  . furosemide (LASIX) 40 MG tablet Take 40 mg  by mouth. Dr [name]    . gabapentin (NEURONTIN) 300 MG capsule Take 300 mg by mouth 4 (four) times daily.    Marland Kitchen guaiFENesin-dextromethorphan (ROBITUSSIN DM) 100-10 MG/5ML syrup Take 5 mLs by mouth every 4 (four) hours as needed for cough. 118 mL 0  . hydroxychloroquine (PLAQUENIL) 200 MG tablet Take 1 tablet (200 mg total) by mouth 2 (two) times daily. 180 tablet 1  . Multiple Vitamin (MULTIVITAMIN WITH MINERALS) TABS tablet Take 1 tablet by mouth daily.    Marland Kitchen nystatin cream (MYCOSTATIN) APPLY 1 APPLICATION TOPICALLY TO THE AFFECTED AREA (OUTER VAGINA) TWICE A DAY 30 g 3  . potassium chloride (MICRO-K) 10 MEQ CR capsule Take 20 mEq by mouth 2 (two) times daily.    . pramipexole (MIRAPEX) 0.25 MG tablet TAKE 2 TABLETS TWICE A DAY 180 tablet 2  . predniSONE (DELTASONE) 10 MG tablet Take 1 tablet (10 mg total) by mouth daily. Day 1: take 60mg  PO.  Decrease by 10mg  each day until Rx is gone. 21 tablet 0  . predniSONE (DELTASONE) 5 MG tablet Take 6 tablets (30mg )  x 1 day and then decrease by 1 tablet each day until down to zero mg. 21 tablet 0  . PROAIR HFA 108 (90 Base) MCG/ACT inhaler USE 2 INHALATIONS EVERY 4 HOURS AS NEEDED FOR WHEEZING OR SHORTNESS OF BREATH 25.5 g 0  . RABEprazole (ACIPHEX) 20 MG tablet Take 1 tablet (20 mg total) by mouth 2 (two) times daily before a meal. 60 tablet 1  . simvastatin (ZOCOR) 20 MG tablet TAKE 1 TABLET DAILY 90 tablet 3  . sodium chloride (OCEAN) 0.65 % nasal spray Place 1 spray into the nose as needed for congestion.    Marland Kitchen tiotropium (SPIRIVA) 18 MCG inhalation capsule Place 1 capsule (18 mcg total) into inhaler and inhale daily. 90 capsule 0  . TOPROL XL 50 MG 24 hr tablet TAKE 1 TABLET DAILY. TAKE WITH OR IMMEDIATELY FOLLOWING A MEAL 90 tablet 1  . traMADol (ULTRAM) 50 MG tablet Take 50 mg by mouth 2 (two) times daily as needed for moderate pain.     No current facility-administered medications on file prior to visit.     Social History  Substance Use Topics   . Smoking status: Never Smoker  . Smokeless tobacco: Never Used  . Alcohol use No    Review of Systems  Constitutional: Negative for chills and fever.  HENT: Positive for congestion and sore throat. Negative for rhinorrhea, sinus pain and sinus pressure.   Eyes: Negative for visual disturbance.  Respiratory: Positive for shortness of breath. Negative for cough and wheezing.   Cardiovascular: Negative for chest pain and palpitations.  Gastrointestinal: Negative for nausea and vomiting.  Neurological: Negative for dizziness.      Objective:    BP 132/70   Pulse 66   Temp 98.5 F (36.9 C) (Oral)   Ht 5\' 4"  (  1.626 m)   Wt 172 lb (78 kg)   SpO2 94%   BMI 29.52 kg/m    Physical Exam  Constitutional: She appears well-developed and well-nourished.  HENT:  Head: Normocephalic and atraumatic.  Right Ear: Hearing, tympanic membrane, external ear and ear canal normal. No drainage, swelling or tenderness. No foreign bodies. Tympanic membrane is not erythematous and not bulging. No middle ear effusion. No decreased hearing is noted.  Left Ear: Hearing, tympanic membrane, external ear and ear canal normal. No drainage, swelling or tenderness. No foreign bodies. Tympanic membrane is not erythematous and not bulging.  No middle ear effusion. No decreased hearing is noted.  Nose: Nose normal. No rhinorrhea. Right sinus exhibits no maxillary sinus tenderness and no frontal sinus tenderness. Left sinus exhibits no maxillary sinus tenderness and no frontal sinus tenderness.  Mouth/Throat: Uvula is midline, oropharynx is clear and moist and mucous membranes are normal. No oral lesions. No oropharyngeal exudate, posterior oropharyngeal edema, posterior oropharyngeal erythema or tonsillar abscesses.  No lesions seen in  Mouth.  Eyes: Conjunctivae are normal.  Cardiovascular: Regular rhythm, normal heart sounds and normal pulses.   Pulmonary/Chest: Effort normal and breath sounds normal. She has no  wheezes. She has no rhonchi. She has no rales.  Lymphadenopathy:       Head (right side): No submental, no submandibular, no tonsillar, no preauricular, no posterior auricular and no occipital adenopathy present.       Head (left side): No submental, no submandibular, no tonsillar, no preauricular, no posterior auricular and no occipital adenopathy present.    She has no cervical adenopathy.  Neurological: She is alert.  Skin: Skin is warm and dry.  Psychiatric: She has a normal mood and affect. Her speech is normal and behavior is normal. Thought content normal.  Vitals reviewed.      Assessment & Plan:  1. Acute bronchitis, unspecified organism Afebrile. sa02 [id number]. Neg flu and strep. Patient and I agreed on conservative management at this time and delayed antibiotics. Return precautions given. - POCT rapid strep A - POCT Influenza A/B - Diphenhyd-Hydrocort-Nystatin (FIRST-DUKES MOUTHWASH) SUSP; Pt is to swish and spit 5 cc's tid  Dispense: 237 mL; Refill: 0     I am having Ms. Mudrick maintain her gabapentin, sodium chloride, hydroxychloroquine, chlorpheniramine-HYDROcodone, guaiFENesin-dextromethorphan, Fluticasone-Salmeterol, multivitamin with minerals, potassium chloride, aspirin EC, Calcium Carbonate-Vitamin D, colestipol, cholecalciferol, traMADol, acidophilus, simvastatin, alendronate, albuterol, furosemide, fluticasone, predniSONE, ADVAIR DISKUS, predniSONE, TOPROL XL, RABEprazole, ALPRAZolam, PROAIR HFA, nystatin cream, pramipexole, furosemide, cetirizine, TANDEM PLUS, tiotropium, and FIRST-DUKES MOUTHWASH.   Meds ordered this encounter  Medications  . Diphenhyd-Hydrocort-Nystatin (FIRST-DUKES MOUTHWASH) SUSP    Sig: Pt is to swish and spit 5 cc's tid    Dispense:  237 mL    Refill:  0    Order Specific Question:   Supervising Provider    Answer:   Crecencio Mc [[id number]]    Return precautions given.   Risks, benefits, and alternatives of the medications and treatment plan  prescribed today were discussed, and patient expressed understanding.   Education regarding symptom management and diagnosis given to patient on AVS.  Continue to follow with [name], MD for routine health maintenance.   Kaitlyn Huff and I agreed with plan.   Mable Paris, FNP

## 2016-06-07 DIAGNOSIS — M05731 Rheumatoid arthritis with rheumatoid factor of right wrist without organ or systems involvement: Secondary | ICD-10-CM | POA: Diagnosis not present

## 2016-06-07 DIAGNOSIS — M05732 Rheumatoid arthritis with rheumatoid factor of left wrist without organ or systems involvement: Secondary | ICD-10-CM | POA: Diagnosis not present

## 2016-06-07 DIAGNOSIS — M0579 Rheumatoid arthritis with rheumatoid factor of multiple sites without organ or systems involvement: Secondary | ICD-10-CM | POA: Diagnosis not present

## 2016-06-09 ENCOUNTER — Other Ambulatory Visit: Payer: Self-pay | Admitting: Internal Medicine

## 2016-06-16 ENCOUNTER — Inpatient Hospital Stay
Admission: EM | Admit: 2016-06-16 | Discharge: 2016-06-21 | DRG: 871 | Disposition: A | Discharge status: Home / Self Care | Payer: Medicare Other | Attending: Internal Medicine | Admitting: Internal Medicine

## 2016-06-16 ENCOUNTER — Other Ambulatory Visit (INDEPENDENT_AMBULATORY_CARE_PROVIDER_SITE_OTHER): Payer: Self-pay | Admitting: Vascular Surgery

## 2016-06-16 ENCOUNTER — Emergency Department: Payer: Medicare Other

## 2016-06-16 ENCOUNTER — Encounter (INDEPENDENT_AMBULATORY_CARE_PROVIDER_SITE_OTHER): Payer: Self-pay | Admitting: Vascular Surgery

## 2016-06-16 ENCOUNTER — Ambulatory Visit (INDEPENDENT_AMBULATORY_CARE_PROVIDER_SITE_OTHER): Payer: Medicare Other | Admitting: Vascular Surgery

## 2016-06-16 VITALS — BP 131/75 | HR 57 | Resp 16 | Ht 64.0 in | Wt 170.0 lb

## 2016-06-16 DIAGNOSIS — G2581 Restless legs syndrome: Secondary | ICD-10-CM | POA: Diagnosis present

## 2016-06-16 DIAGNOSIS — Z888 Allergy status to other drugs, medicaments and biological substances status: Secondary | ICD-10-CM

## 2016-06-16 DIAGNOSIS — M069 Rheumatoid arthritis, unspecified: Secondary | ICD-10-CM | POA: Diagnosis not present

## 2016-06-16 DIAGNOSIS — K219 Gastro-esophageal reflux disease without esophagitis: Secondary | ICD-10-CM | POA: Diagnosis present

## 2016-06-16 DIAGNOSIS — I4891 Unspecified atrial fibrillation: Secondary | ICD-10-CM | POA: Diagnosis not present

## 2016-06-16 DIAGNOSIS — R6521 Severe sepsis with septic shock: Secondary | ICD-10-CM | POA: Diagnosis present

## 2016-06-16 DIAGNOSIS — Z7952 Long term (current) use of systemic steroids: Secondary | ICD-10-CM

## 2016-06-16 DIAGNOSIS — I872 Venous insufficiency (chronic) (peripheral): Secondary | ICD-10-CM | POA: Diagnosis not present

## 2016-06-16 DIAGNOSIS — Z7951 Long term (current) use of inhaled steroids: Secondary | ICD-10-CM

## 2016-06-16 DIAGNOSIS — A419 Sepsis, unspecified organism: Secondary | ICD-10-CM | POA: Diagnosis not present

## 2016-06-16 DIAGNOSIS — Z885 Allergy status to narcotic agent status: Secondary | ICD-10-CM

## 2016-06-16 DIAGNOSIS — N179 Acute kidney failure, unspecified: Secondary | ICD-10-CM | POA: Diagnosis not present

## 2016-06-16 DIAGNOSIS — R531 Weakness: Secondary | ICD-10-CM | POA: Diagnosis not present

## 2016-06-16 DIAGNOSIS — I8311 Varicose veins of right lower extremity with inflammation: Secondary | ICD-10-CM

## 2016-06-16 DIAGNOSIS — Z9049 Acquired absence of other specified parts of digestive tract: Secondary | ICD-10-CM

## 2016-06-16 DIAGNOSIS — Z882 Allergy status to sulfonamides status: Secondary | ICD-10-CM

## 2016-06-16 DIAGNOSIS — R262 Difficulty in walking, not elsewhere classified: Secondary | ICD-10-CM

## 2016-06-16 DIAGNOSIS — M1990 Unspecified osteoarthritis, unspecified site: Secondary | ICD-10-CM | POA: Diagnosis present

## 2016-06-16 DIAGNOSIS — J181 Lobar pneumonia, unspecified organism: Secondary | ICD-10-CM

## 2016-06-16 DIAGNOSIS — R110 Nausea: Secondary | ICD-10-CM

## 2016-06-16 DIAGNOSIS — J189 Pneumonia, unspecified organism: Secondary | ICD-10-CM

## 2016-06-16 DIAGNOSIS — R918 Other nonspecific abnormal finding of lung field: Secondary | ICD-10-CM | POA: Diagnosis not present

## 2016-06-16 DIAGNOSIS — Z66 Do not resuscitate: Secondary | ICD-10-CM | POA: Diagnosis present

## 2016-06-16 DIAGNOSIS — R42 Dizziness and giddiness: Secondary | ICD-10-CM

## 2016-06-16 DIAGNOSIS — R112 Nausea with vomiting, unspecified: Secondary | ICD-10-CM

## 2016-06-16 DIAGNOSIS — I8312 Varicose veins of left lower extremity with inflammation: Secondary | ICD-10-CM | POA: Diagnosis not present

## 2016-06-16 DIAGNOSIS — I83813 Varicose veins of bilateral lower extremities with pain: Secondary | ICD-10-CM

## 2016-06-16 DIAGNOSIS — R197 Diarrhea, unspecified: Secondary | ICD-10-CM | POA: Diagnosis present

## 2016-06-16 DIAGNOSIS — E119 Type 2 diabetes mellitus without complications: Secondary | ICD-10-CM | POA: Diagnosis present

## 2016-06-16 DIAGNOSIS — Z7982 Long term (current) use of aspirin: Secondary | ICD-10-CM

## 2016-06-16 DIAGNOSIS — I10 Essential (primary) hypertension: Secondary | ICD-10-CM | POA: Diagnosis present

## 2016-06-16 DIAGNOSIS — M810 Age-related osteoporosis without current pathological fracture: Secondary | ICD-10-CM | POA: Diagnosis present

## 2016-06-16 DIAGNOSIS — R109 Unspecified abdominal pain: Secondary | ICD-10-CM

## 2016-06-16 DIAGNOSIS — J440 Chronic obstructive pulmonary disease with acute lower respiratory infection: Secondary | ICD-10-CM | POA: Diagnosis present

## 2016-06-16 DIAGNOSIS — E785 Hyperlipidemia, unspecified: Secondary | ICD-10-CM | POA: Diagnosis present

## 2016-06-16 DIAGNOSIS — Z7983 Long term (current) use of bisphosphonates: Secondary | ICD-10-CM

## 2016-06-16 HISTORY — DX: Malignant (primary) neoplasm, unspecified: C80.1

## 2016-06-16 MED ORDER — SODIUM CHLORIDE 0.9 % IV BOLUS (SEPSIS)
1000.0000 mL | Freq: Once | INTRAVENOUS | Status: AC
  Administered 2016-06-16: 1000 mL via INTRAVENOUS

## 2016-06-16 NOTE — ED Triage Notes (Signed)
Pt presents to ED from home via [facility] with c/o N/V. EMS reports pt had a procedure done today for varicose veins in the LEFT leg. Pt denies any c/o pain, but reports feeling SHOB earlier. EMS reports giving 4mg  Zofran en route with relief of nausea. EMS reports pt wears 2L O2 at night, placed on 4L en route to obtain sats of 92%. Pt was noted to be 89% on RA at arrival.

## 2016-06-16 NOTE — Progress Notes (Signed)
    MRN : [id number]  ELLAINE MARTINEC is a 75 y.o. (09/10/1940) female who presents with chief complaint of  Chief Complaint  Patient presents with  . Varicose Veins    Right leg laser ablation, SSV  .    The patient's right lower extremity was sterilely prepped and draped.  The ultrasound machine was used to visualize the small saphenous vein throughout its course.  A segment in the mid calf was selected for access.  The saphenous vein was accessed without difficulty using ultrasound guidance with a micropuncture needle.   An 0.018  wire was placed beyond the saphenofemoral junction through the sheath and the microneedle was removed.  The 65 cm sheath was then placed over the wire and the wire and dilator were removed.  The laser fiber was placed through the sheath and its tip was placed approximately 2 cm below the saphenopoplitea junction.  Tumescent anesthesia was then created with a dilute lidocaine solution.  Laser energy was then delivered with constant withdrawal of the sheath and laser fiber.  Approximately 788 Joules of energy were delivered over a length of 18 cm.  Sterile dressings were placed.  The patient tolerated the procedure well without complications.

## 2016-06-17 DIAGNOSIS — I959 Hypotension, unspecified: Secondary | ICD-10-CM | POA: Diagnosis not present

## 2016-06-17 DIAGNOSIS — Z7952 Long term (current) use of systemic steroids: Secondary | ICD-10-CM | POA: Diagnosis not present

## 2016-06-17 DIAGNOSIS — E119 Type 2 diabetes mellitus without complications: Secondary | ICD-10-CM | POA: Diagnosis present

## 2016-06-17 DIAGNOSIS — A419 Sepsis, unspecified organism: Principal | ICD-10-CM

## 2016-06-17 DIAGNOSIS — E785 Hyperlipidemia, unspecified: Secondary | ICD-10-CM | POA: Diagnosis present

## 2016-06-17 DIAGNOSIS — Z888 Allergy status to other drugs, medicaments and biological substances status: Secondary | ICD-10-CM | POA: Diagnosis not present

## 2016-06-17 DIAGNOSIS — M810 Age-related osteoporosis without current pathological fracture: Secondary | ICD-10-CM | POA: Diagnosis present

## 2016-06-17 DIAGNOSIS — Z7951 Long term (current) use of inhaled steroids: Secondary | ICD-10-CM | POA: Diagnosis not present

## 2016-06-17 DIAGNOSIS — Z7982 Long term (current) use of aspirin: Secondary | ICD-10-CM | POA: Diagnosis not present

## 2016-06-17 DIAGNOSIS — D729 Disorder of white blood cells, unspecified: Secondary | ICD-10-CM | POA: Diagnosis not present

## 2016-06-17 DIAGNOSIS — Z882 Allergy status to sulfonamides status: Secondary | ICD-10-CM | POA: Diagnosis not present

## 2016-06-17 DIAGNOSIS — Z7983 Long term (current) use of bisphosphonates: Secondary | ICD-10-CM | POA: Diagnosis not present

## 2016-06-17 DIAGNOSIS — J189 Pneumonia, unspecified organism: Secondary | ICD-10-CM

## 2016-06-17 DIAGNOSIS — J440 Chronic obstructive pulmonary disease with acute lower respiratory infection: Secondary | ICD-10-CM | POA: Diagnosis present

## 2016-06-17 DIAGNOSIS — R6521 Severe sepsis with septic shock: Secondary | ICD-10-CM | POA: Diagnosis present

## 2016-06-17 DIAGNOSIS — G2581 Restless legs syndrome: Secondary | ICD-10-CM | POA: Diagnosis present

## 2016-06-17 DIAGNOSIS — Z885 Allergy status to narcotic agent status: Secondary | ICD-10-CM | POA: Diagnosis not present

## 2016-06-17 DIAGNOSIS — R197 Diarrhea, unspecified: Secondary | ICD-10-CM | POA: Diagnosis present

## 2016-06-17 DIAGNOSIS — Z66 Do not resuscitate: Secondary | ICD-10-CM | POA: Diagnosis present

## 2016-06-17 DIAGNOSIS — Z9049 Acquired absence of other specified parts of digestive tract: Secondary | ICD-10-CM | POA: Diagnosis not present

## 2016-06-17 DIAGNOSIS — I4891 Unspecified atrial fibrillation: Secondary | ICD-10-CM | POA: Diagnosis not present

## 2016-06-17 DIAGNOSIS — M1990 Unspecified osteoarthritis, unspecified site: Secondary | ICD-10-CM | POA: Diagnosis present

## 2016-06-17 DIAGNOSIS — R51 Headache: Secondary | ICD-10-CM | POA: Diagnosis not present

## 2016-06-17 DIAGNOSIS — K760 Fatty (change of) liver, not elsewhere classified: Secondary | ICD-10-CM | POA: Diagnosis not present

## 2016-06-17 DIAGNOSIS — D72829 Elevated white blood cell count, unspecified: Secondary | ICD-10-CM | POA: Diagnosis not present

## 2016-06-17 DIAGNOSIS — M069 Rheumatoid arthritis, unspecified: Secondary | ICD-10-CM | POA: Diagnosis present

## 2016-06-17 DIAGNOSIS — I480 Paroxysmal atrial fibrillation: Secondary | ICD-10-CM | POA: Diagnosis not present

## 2016-06-17 DIAGNOSIS — R112 Nausea with vomiting, unspecified: Secondary | ICD-10-CM | POA: Diagnosis not present

## 2016-06-17 DIAGNOSIS — N179 Acute kidney failure, unspecified: Secondary | ICD-10-CM | POA: Diagnosis present

## 2016-06-17 DIAGNOSIS — I10 Essential (primary) hypertension: Secondary | ICD-10-CM | POA: Diagnosis present

## 2016-06-17 DIAGNOSIS — K219 Gastro-esophageal reflux disease without esophagitis: Secondary | ICD-10-CM | POA: Diagnosis present

## 2016-06-17 LAB — CBC WITH DIFFERENTIAL/PLATELET
BASOS ABS: 0 10*3/uL (ref 0–0.1)
Basophils Relative: 0 %
Eosinophils Absolute: 0.1 10*3/uL (ref 0–0.7)
Eosinophils Relative: 1 %
HEMATOCRIT: 37.4 % (ref 35.0–47.0)
HEMOGLOBIN: 12.8 g/dL (ref 12.0–16.0)
Lymphocytes Relative: 12 %
Lymphs Abs: 1.8 10*3/uL (ref 1.0–3.6)
MCH: 31.5 pg (ref 26.0–34.0)
MCHC: 34.1 g/dL (ref 32.0–36.0)
MCV: 92.3 fL (ref 80.0–100.0)
MONO ABS: 0.7 10*3/uL (ref 0.2–0.9)
MONOS PCT: 4 %
NEUTROS ABS: 12.5 10*3/uL — AB (ref 1.4–6.5)
Neutrophils Relative %: 83 %
Platelets: 204 10*3/uL (ref 150–440)
RBC: 4.05 MIL/uL (ref 3.80–5.20)
RDW: 12.9 % (ref 11.5–14.5)
WBC: 15 10*3/uL — ABNORMAL HIGH (ref 3.6–11.0)

## 2016-06-17 LAB — COMPREHENSIVE METABOLIC PANEL
ALT: 31 U/L (ref 14–54)
AST: 44 U/L — AB (ref 15–41)
Albumin: 4.2 g/dL (ref 3.5–5.0)
Alkaline Phosphatase: 43 U/L (ref 38–126)
Anion gap: 10 (ref 5–15)
BUN: 14 mg/dL (ref 6–20)
CHLORIDE: 98 mmol/L — AB (ref 101–111)
CO2: 28 mmol/L (ref 22–32)
CREATININE: 1.24 mg/dL — AB (ref 0.44–1.00)
Calcium: 8.9 mg/dL (ref 8.9–10.3)
GFR calc Af Amer: 49 mL/min — ABNORMAL LOW (ref >60)
GFR calc non Af Amer: 42 mL/min — ABNORMAL LOW (ref >60)
Glucose, Bld: 120 mg/dL — ABNORMAL HIGH (ref 65–99)
Potassium: 4.1 mmol/L (ref 3.5–5.1)
SODIUM: 136 mmol/L (ref 135–145)
Total Bilirubin: 0.8 mg/dL (ref 0.3–1.2)
Total Protein: 7.9 g/dL (ref 6.5–8.1)

## 2016-06-17 LAB — INFLUENZA PANEL BY PCR (TYPE A & B)
INFLBPCR: NEGATIVE
Influenza A By PCR: NEGATIVE

## 2016-06-17 LAB — PROCALCITONIN: Procalcitonin: <0.1 ng/mL

## 2016-06-17 LAB — GLUCOSE, CAPILLARY
GLUCOSE-CAPILLARY: 124 mg/dL — AB (ref 65–99)
GLUCOSE-CAPILLARY: 105 mg/dL — AB (ref 65–99)
Glucose-Capillary: 141 mg/dL — ABNORMAL HIGH (ref 65–99)

## 2016-06-17 LAB — BASIC METABOLIC PANEL
Anion gap: 7 (ref 5–15)
BUN: 15 mg/dL (ref 6–20)
CHLORIDE: 103 mmol/L (ref 101–111)
CO2: 25 mmol/L (ref 22–32)
CREATININE: 1.29 mg/dL — AB (ref 0.44–1.00)
Calcium: 7.4 mg/dL — ABNORMAL LOW (ref 8.9–10.3)
GFR, EST AFRICAN AMERICAN: 46 mL/min — AB (ref >60)
GFR, EST NON AFRICAN AMERICAN: 40 mL/min — AB (ref >60)
Glucose, Bld: 97 mg/dL (ref 65–99)
POTASSIUM: 3.8 mmol/L (ref 3.5–5.1)
SODIUM: 135 mmol/L (ref 135–145)

## 2016-06-17 LAB — MRSA PCR SCREENING: MRSA by PCR: NEGATIVE

## 2016-06-17 LAB — CBC
HCT: 30.8 % — ABNORMAL LOW (ref 35.0–47.0)
Hemoglobin: 10.5 g/dL — ABNORMAL LOW (ref 12.0–16.0)
MCH: 31.3 pg (ref 26.0–34.0)
MCHC: 34 g/dL (ref 32.0–36.0)
MCV: 92.1 fL (ref 80.0–100.0)
PLATELETS: 179 10*3/uL (ref 150–440)
RBC: 3.35 MIL/uL — AB (ref 3.80–5.20)
RDW: 12.9 % (ref 11.5–14.5)
WBC: 20.2 10*3/uL — AB (ref 3.6–11.0)

## 2016-06-17 LAB — TROPONIN I
TROPONIN I: 0.05 ng/mL — AB (ref <0.03)
TROPONIN I: 0.04 ng/mL — AB (ref <0.03)
Troponin I: 0.04 ng/mL (ref <0.03)

## 2016-06-17 LAB — LACTIC ACID, PLASMA
LACTIC ACID, VENOUS: 2.4 mmol/L — AB (ref 0.5–1.9)
Lactic Acid, Venous: 1.4 mmol/L (ref 0.5–1.9)
Lactic Acid, Venous: 1.5 mmol/L (ref 0.5–1.9)
Lactic Acid, Venous: 2.4 mmol/L (ref 0.5–1.9)

## 2016-06-17 LAB — PROTIME-INR
INR: 1.05
Prothrombin Time: 13.7 seconds (ref 11.4–15.2)

## 2016-06-17 LAB — APTT: aPTT: <24 seconds — ABNORMAL LOW (ref 24–36)

## 2016-06-17 LAB — FIBRIN DERIVATIVES D-DIMER (ARMC ONLY): Fibrin derivatives D-dimer (ARMC): 810 — ABNORMAL HIGH (ref 0–499)

## 2016-06-17 LAB — LIPASE, BLOOD: Lipase: 69 U/L — ABNORMAL HIGH (ref 11–51)

## 2016-06-17 MED ORDER — ENOXAPARIN SODIUM 80 MG/0.8ML ~~LOC~~ SOLN
1.0000 mg/kg | Freq: Two times a day (BID) | SUBCUTANEOUS | Status: DC
  Administered 2016-06-17: 17:00:00 80 mg via SUBCUTANEOUS
  Filled 2016-06-17: qty 0.8

## 2016-06-17 MED ORDER — TRAMADOL HCL 50 MG PO TABS: 50.0000 mg | ORAL_TABLET | Freq: Two times a day (BID) | ORAL | Status: DC | PRN

## 2016-06-17 MED ORDER — ALUM & MAG HYDROXIDE-SIMETH 200-200-20 MG/5ML PO SUSP
30.0000 mL | ORAL | Status: DC | PRN
  Administered 2016-06-17: 30 mL via ORAL
  Filled 2016-06-17: qty 30

## 2016-06-17 MED ORDER — ADULT MULTIVITAMIN W/MINERALS CH
1.0000 | ORAL_TABLET | Freq: Every day | ORAL | Status: DC
  Administered 2016-06-17 – 2016-06-21 (×5): 1 via ORAL
  Filled 2016-06-17 (×5): qty 1

## 2016-06-17 MED ORDER — ACETAMINOPHEN 650 MG RE SUPP: 650.0000 mg | Freq: Four times a day (QID) | RECTAL | Status: DC | PRN

## 2016-06-17 MED ORDER — ACETAMINOPHEN 325 MG PO TABS: 650.0000 mg | ORAL_TABLET | Freq: Four times a day (QID) | ORAL | Status: DC | PRN

## 2016-06-17 MED ORDER — ENOXAPARIN SODIUM 80 MG/0.8ML ~~LOC~~ SOLN
1.0000 mg/kg | Freq: Two times a day (BID) | SUBCUTANEOUS | Status: DC
  Administered 2016-06-18 – 2016-06-21 (×7): 80 mg via SUBCUTANEOUS
  Filled 2016-06-17 (×8): qty 0.8

## 2016-06-17 MED ORDER — ACETAMINOPHEN 500 MG PO TABS
1000.0000 mg | ORAL_TABLET | Freq: Once | ORAL | Status: AC
  Administered 2016-06-17: 1000 mg via ORAL
  Filled 2016-06-17: qty 2

## 2016-06-17 MED ORDER — CALCIUM CITRATE-VITAMIN D 500-400 MG-UNIT PO CHEW
1.0000 | CHEWABLE_TABLET | Freq: Two times a day (BID) | ORAL | Status: DC
  Administered 2016-06-17 – 2016-06-18 (×3): 1 via ORAL
  Filled 2016-06-17 (×4): qty 1

## 2016-06-17 MED ORDER — AZITHROMYCIN 500 MG IV SOLR
500.0000 mg | Freq: Once | INTRAVENOUS | Status: DC
  Filled 2016-06-17: qty 500

## 2016-06-17 MED ORDER — CEFTRIAXONE SODIUM 1 G IJ SOLR: 1.0000 g | Freq: Once | INTRAMUSCULAR | Status: DC

## 2016-06-17 MED ORDER — INFLUENZA VAC SPLIT QUAD 0.5 ML IM SUSY: 0.5000 mL | PREFILLED_SYRINGE | INTRAMUSCULAR | Status: DC

## 2016-06-17 MED ORDER — SODIUM CHLORIDE 0.9 % IV BOLUS (SEPSIS)
500.0000 mL | Freq: Once | INTRAVENOUS | Status: AC
  Administered 2016-06-17: 500 mL via INTRAVENOUS

## 2016-06-17 MED ORDER — ACETAMINOPHEN 325 MG PO TABS
650.0000 mg | ORAL_TABLET | Freq: Four times a day (QID) | ORAL | Status: DC | PRN
  Administered 2016-06-17 – 2016-06-20 (×3): 650 mg via ORAL
  Filled 2016-06-17 (×2): qty 2

## 2016-06-17 MED ORDER — NYSTATIN 100000 UNIT/GM EX CREA
TOPICAL_CREAM | Freq: Two times a day (BID) | CUTANEOUS | Status: DC | PRN
  Filled 2016-06-17: qty 15

## 2016-06-17 MED ORDER — SIMVASTATIN 20 MG PO TABS
20.0000 mg | ORAL_TABLET | Freq: Every day | ORAL | Status: DC
  Administered 2016-06-17 – 2016-06-19 (×3): 20 mg via ORAL
  Filled 2016-06-17 (×4): qty 1

## 2016-06-17 MED ORDER — NYSTATIN 100000 UNIT/GM EX CREA
TOPICAL_CREAM | Freq: Two times a day (BID) | CUTANEOUS | Status: DC
  Filled 2016-06-17: qty 15

## 2016-06-17 MED ORDER — HYDROXYCHLOROQUINE SULFATE 200 MG PO TABS
200.0000 mg | ORAL_TABLET | Freq: Two times a day (BID) | ORAL | Status: DC
  Administered 2016-06-17 – 2016-06-21 (×9): 200 mg via ORAL
  Filled 2016-06-17 (×9): qty 1

## 2016-06-17 MED ORDER — HYDROCORTISONE NA SUCCINATE PF 100 MG IJ SOLR
50.0000 mg | INTRAMUSCULAR | Status: AC
  Administered 2016-06-17: 50 mg via INTRAVENOUS
  Filled 2016-06-17: qty 2

## 2016-06-17 MED ORDER — DEXTROSE 5 % IV SOLN: 1.0000 g | Freq: Once | INTRAVENOUS | Status: DC

## 2016-06-17 MED ORDER — NOREPINEPHRINE BITARTRATE 1 MG/ML IV SOLN
0.0000 ug/min | INTRAVENOUS | Status: DC
  Administered 2016-06-17: 2 ug/min via INTRAVENOUS
  Administered 2016-06-17: 6 ug/min via INTRAVENOUS
  Filled 2016-06-17: qty 4

## 2016-06-17 MED ORDER — GABAPENTIN 300 MG PO CAPS
300.0000 mg | ORAL_CAPSULE | Freq: Three times a day (TID) | ORAL | Status: DC
  Administered 2016-06-17 – 2016-06-21 (×14): 300 mg via ORAL
  Filled 2016-06-17 (×14): qty 1

## 2016-06-17 MED ORDER — ONDANSETRON HCL 4 MG PO TABS: 4.0000 mg | ORAL_TABLET | Freq: Four times a day (QID) | ORAL | Status: DC | PRN

## 2016-06-17 MED ORDER — ASPIRIN EC 81 MG PO TBEC
81.0000 mg | DELAYED_RELEASE_TABLET | Freq: Every day | ORAL | Status: DC
  Administered 2016-06-17 – 2016-06-21 (×5): 81 mg via ORAL
  Filled 2016-06-17 (×5): qty 1

## 2016-06-17 MED ORDER — CEFTRIAXONE SODIUM-DEXTROSE 1-3.74 GM-% IV SOLR
1.0000 g | Freq: Once | INTRAVENOUS | Status: AC
  Administered 2016-06-17: 1 g via INTRAVENOUS
  Filled 2016-06-17: qty 50

## 2016-06-17 MED ORDER — DEXTROSE 5 % IV SOLN
500.0000 mg | INTRAVENOUS | Status: DC
  Administered 2016-06-18: 500 mg via INTRAVENOUS
  Filled 2016-06-17: qty 500

## 2016-06-17 MED ORDER — GUAIFENESIN-DM 100-10 MG/5ML PO SYRP
5.0000 mL | ORAL_SOLUTION | ORAL | Status: DC | PRN
  Administered 2016-06-18 – 2016-06-21 (×5): 5 mL via ORAL
  Filled 2016-06-17 (×5): qty 5

## 2016-06-17 MED ORDER — FLUTICASONE PROPIONATE 50 MCG/ACT NA SUSP
2.0000 | Freq: Every day | NASAL | Status: DC
  Administered 2016-06-17 – 2016-06-21 (×4): 2 via NASAL
  Filled 2016-06-17: qty 16

## 2016-06-17 MED ORDER — RISAQUAD PO CAPS
1.0000 | ORAL_CAPSULE | Freq: Every day | ORAL | Status: DC
  Administered 2016-06-17 – 2016-06-21 (×5): 1 via ORAL
  Filled 2016-06-17 (×5): qty 1

## 2016-06-17 MED ORDER — SENNOSIDES-DOCUSATE SODIUM 8.6-50 MG PO TABS: 1.0000 | ORAL_TABLET | Freq: Every evening | ORAL | Status: DC | PRN

## 2016-06-17 MED ORDER — ONDANSETRON HCL 4 MG/2ML IJ SOLN
4.0000 mg | Freq: Once | INTRAMUSCULAR | Status: AC
  Administered 2016-06-17: 4 mg via INTRAVENOUS
  Filled 2016-06-17: qty 2

## 2016-06-17 MED ORDER — INSULIN ASPART 100 UNIT/ML ~~LOC~~ SOLN
2.0000 [IU] | Freq: Three times a day (TID) | SUBCUTANEOUS | Status: DC
  Administered 2016-06-17: 4 [IU] via SUBCUTANEOUS
  Administered 2016-06-17 – 2016-06-18 (×2): 2 [IU] via SUBCUTANEOUS
  Filled 2016-06-17 (×2): qty 4
  Filled 2016-06-17: qty 2

## 2016-06-17 MED ORDER — NOREPINEPHRINE 4 MG/250ML-% IV SOLN
INTRAVENOUS | Status: AC
Start: 2016-06-17 — End: 2016-06-17
  Administered 2016-06-17: 0.002 mg/min via INTRAVENOUS
  Filled 2016-06-17: qty 250

## 2016-06-17 MED ORDER — SALINE SPRAY 0.65 % NA SOLN
1.0000 | NASAL | Status: DC | PRN
  Administered 2016-06-17: 1 via NASAL
  Filled 2016-06-17: qty 44

## 2016-06-17 MED ORDER — DEXTROSE 5 % IV SOLN
1.0000 g | INTRAVENOUS | Status: DC
  Administered 2016-06-18 – 2016-06-21 (×4): 1 g via INTRAVENOUS
  Filled 2016-06-17 (×4): qty 10

## 2016-06-17 MED ORDER — SODIUM CHLORIDE 0.9 % IV SOLN
INTRAVENOUS | Status: DC
  Administered 2016-06-17 – 2016-06-20 (×4): via INTRAVENOUS

## 2016-06-17 MED ORDER — NOREPINEPHRINE 4 MG/250ML-% IV SOLN
2.0000 ug/min | Freq: Once | INTRAVENOUS | Status: AC
  Administered 2016-06-17: 0.002 mg/min via INTRAVENOUS

## 2016-06-17 MED ORDER — PRAMIPEXOLE DIHYDROCHLORIDE 0.25 MG PO TABS
0.5000 mg | ORAL_TABLET | Freq: Two times a day (BID) | ORAL | Status: DC
  Administered 2016-06-17 – 2016-06-21 (×9): 0.5 mg via ORAL
  Filled 2016-06-17 (×10): qty 2

## 2016-06-17 MED ORDER — LORATADINE 10 MG PO TABS
10.0000 mg | ORAL_TABLET | Freq: Every day | ORAL | Status: DC
  Administered 2016-06-17 – 2016-06-21 (×5): 10 mg via ORAL
  Filled 2016-06-17 (×5): qty 1

## 2016-06-17 MED ORDER — DEXTROSE 5 % IV SOLN
500.0000 mg | Freq: Once | INTRAVENOUS | Status: AC
  Administered 2016-06-17: 500 mg via INTRAVENOUS
  Filled 2016-06-17: qty 500

## 2016-06-17 MED ORDER — PANTOPRAZOLE SODIUM 40 MG PO TBEC
40.0000 mg | DELAYED_RELEASE_TABLET | Freq: Every day | ORAL | Status: DC
  Administered 2016-06-17 – 2016-06-21 (×5): 40 mg via ORAL
  Filled 2016-06-17 (×5): qty 1

## 2016-06-17 MED ORDER — METOPROLOL SUCCINATE ER 50 MG PO TB24: 50.0000 mg | ORAL_TABLET | Freq: Every day | ORAL | Status: DC

## 2016-06-17 MED ORDER — SODIUM CHLORIDE 0.9 % IV BOLUS (SEPSIS): 1500.0000 mL | Freq: Once | INTRAVENOUS | Status: DC

## 2016-06-17 MED ORDER — COLESTIPOL HCL 1 G PO TABS
2.0000 g | ORAL_TABLET | Freq: Two times a day (BID) | ORAL | Status: DC
  Filled 2016-06-17: qty 2

## 2016-06-17 MED ORDER — HYDROCORTISONE NA SUCCINATE PF 100 MG IJ SOLR
50.0000 mg | Freq: Three times a day (TID) | INTRAMUSCULAR | Status: DC
  Administered 2016-06-17 – 2016-06-18 (×4): 50 mg via INTRAVENOUS
  Filled 2016-06-17 (×5): qty 2

## 2016-06-17 MED ORDER — NOREPINEPHRINE BITARTRATE 1 MG/ML IV SOLN: 2.0000 ug/min | Freq: Once | INTRAVENOUS | Status: DC

## 2016-06-17 MED ORDER — SODIUM CHLORIDE 0.9 % IV BOLUS (SEPSIS)
1000.0000 mL | Freq: Once | INTRAVENOUS | Status: AC
  Administered 2016-06-17: 1000 mL via INTRAVENOUS

## 2016-06-17 MED ORDER — ONDANSETRON HCL 4 MG/2ML IJ SOLN
4.0000 mg | Freq: Four times a day (QID) | INTRAMUSCULAR | Status: DC | PRN
  Administered 2016-06-17 – 2016-06-19 (×3): 4 mg via INTRAVENOUS
  Filled 2016-06-17 (×3): qty 2

## 2016-06-17 MED ORDER — ALPRAZOLAM 0.25 MG PO TABS
0.2500 mg | ORAL_TABLET | Freq: Every day | ORAL | Status: DC | PRN
  Administered 2016-06-17 – 2016-06-20 (×5): 0.25 mg via ORAL
  Filled 2016-06-17 (×5): qty 1

## 2016-06-17 MED ORDER — VITAMIN D 1000 UNITS PO TABS
1000.0000 [IU] | ORAL_TABLET | Freq: Every day | ORAL | Status: DC
  Administered 2016-06-17 – 2016-06-21 (×5): 1000 [IU] via ORAL
  Filled 2016-06-17 (×5): qty 1

## 2016-06-17 MED ORDER — ENOXAPARIN SODIUM 40 MG/0.4ML ~~LOC~~ SOLN: 40.0000 mg | Freq: Every day | SUBCUTANEOUS | Status: DC

## 2016-06-17 NOTE — Progress Notes (Signed)
[place] at [facility]                                                                                                                                                                                  Patient Demographics   Kaitlyn Huff, is a 75 y.o. female, DOB - 01-12-1941, [id number]  Admit date - 06/16/2016   Admitting Physician Saundra Shelling, MD  Outpatient Primary MD for the patient is [name], MD   LOS - 0  Subjective: Patient had a laser vascular procedure yesterday admitted with sepsis She is complaining of cough and congestion Patient continues to be on Levophed She reports feeling better    Review of Systems:   CONSTITUTIONAL: No documented fever. No fatigue, weakness. No weight gain, no weight loss.  EYES: No blurry or double vision.  ENT: No tinnitus. No postnasal drip. No redness of the oropharynx.  RESPIRATORY: Positive cough, no wheeze, no hemoptysis. No dyspnea.  CARDIOVASCULAR: No chest pain. No orthopnea. No palpitations. No syncope.  GASTROINTESTINAL: No nausea, no vomiting or diarrhea. No abdominal pain. No melena or hematochezia.  GENITOURINARY: No dysuria or hematuria.  ENDOCRINE: No polyuria or nocturia. No heat or cold intolerance.  HEMATOLOGY: No anemia. No bruising. No bleeding.  INTEGUMENTARY: No rashes. No lesions.  MUSCULOSKELETAL: No arthritis. No swelling. No gout.  NEUROLOGIC: No numbness, tingling, or ataxia. No seizure-type activity.  PSYCHIATRIC: No anxiety. No insomnia. No ADD.    Vitals:   Vitals:   06/17/16 1014 06/17/16 1100 06/17/16 1200 06/17/16 1300  BP: (!) 114/51 97/82 135/60 (!) 98/37  Pulse: 67 66 66 67  Resp: 19 17 20  (!) 28  Temp: 98.5 F (36.9 C)     TempSrc: Oral     SpO2: 95%  97%   Weight: 180 lb 12.4 oz (82 kg)     Height: 5\' 4"  (1.626 m)       Wt Readings from Last 3 Encounters:  06/17/16 180 lb 12.4 oz (82 kg)  06/16/16 170 lb (77.1 kg)  06/06/16 172 lb (78 kg)      Intake/Output Summary (Last 24 hours) at 06/17/16 1414 Last data filed at 06/17/16 1200  Gross per 24 hour  Intake             2750 ml  Output              450 ml  Net             2300 ml    Physical Exam:   GENERAL: Pleasant-appearing in no apparent distress.  HEAD, EYES, EARS, NOSE AND THROAT: Atraumatic, normocephalic. Extraocular muscles are intact. Pupils equal and  reactive to light. Sclerae anicteric. No conjunctival injection. No oro-pharyngeal erythema.  NECK: Supple. There is no jugular venous distention. No bruits, no lymphadenopathy, no thyromegaly.  HEART: Regular rate and rhythm,. No murmurs, no rubs, no clicks.  LUNGS: Clear to auscultation bilaterally. No rales or rhonchi. No wheezes.  ABDOMEN: Soft, flat, nontender, nondistended. Has good bowel sounds. No hepatosplenomegaly appreciated.  EXTREMITIES: No evidence of any cyanosis, clubbing, or peripheral edema.  +2 pedal and radial pulses bilaterally.  NEUROLOGIC: The patient is alert, awake, and oriented x3 with no focal motor or sensory deficits appreciated bilaterally.  SKIN: Moist and warm with no rashes appreciated.  Psych: Not anxious, depressed LN: No inguinal LN enlargement    Antibiotics   Anti-infectives    Start     Dose/Rate Route Frequency Ordered Stop   06/18/16 0800  cefTRIAXone (ROCEPHIN) 1 g in dextrose 5 % 50 mL IVPB     1 g 100 mL/hr over 30 Minutes Intravenous Every 24 hours 06/17/16 1059     06/18/16 0800  azithromycin (ZITHROMAX) 500 mg in dextrose 5 % 250 mL IVPB     500 mg 250 mL/hr over 60 Minutes Intravenous Every 24 hours 06/17/16 1100     06/17/16 1100  hydroxychloroquine (PLAQUENIL) tablet 200 mg     200 mg Oral 2 times daily 06/17/16 1014     06/17/16 1100  azithromycin (ZITHROMAX) 500 mg in dextrose 5 % 250 mL IVPB  Status:  Discontinued     500 mg 250 mL/hr over 60 Minutes Intravenous  Once 06/17/16 1014 06/17/16 1035   06/17/16 1015  cefTRIAXone (ROCEPHIN) 1 g in dextrose 5  % 50 mL IVPB  Status:  Discontinued     1 g 100 mL/hr over 30 Minutes Intravenous  Once 06/17/16 1014 06/17/16 1031   06/17/16 0045  cefTRIAXone (ROCEPHIN) 1 g in dextrose 5 % 50 mL IVPB  Status:  Discontinued     1 g 100 mL/hr over 30 Minutes Intravenous  Once 06/17/16 0031 06/17/16 0032   06/17/16 0045  azithromycin (ZITHROMAX) 500 mg in dextrose 5 % 250 mL IVPB     500 mg 250 mL/hr over 60 Minutes Intravenous  Once 06/17/16 0031 06/17/16 0249   06/17/16 0045  cefTRIAXone (ROCEPHIN) IVPB 1 g     1 g 100 mL/hr over 30 Minutes Intravenous  Once 06/17/16 0032 06/17/16 0126      Medications   Scheduled Meds: . acidophilus  1 capsule Oral Daily  . aspirin EC  81 mg Oral Daily  . [START ON 06/18/2016] azithromycin  500 mg Intravenous Q24H  . calcium citrate-vitamin D  1 tablet Oral BID  . [START ON 06/18/2016] cefTRIAXone (ROCEPHIN)  IV  1 g Intravenous Q24H  . cholecalciferol  1,000 Units Oral Daily  . enoxaparin (LOVENOX) injection  40 mg Subcutaneous QHS  . fluticasone  2 spray Each Nare Daily  . gabapentin  300 mg Oral TID WC & HS  . hydrocortisone sod succinate (SOLU-CORTEF) inj  50 mg Intravenous Q8H  . hydroxychloroquine  200 mg Oral BID  . insulin aspart  2-6 Units Subcutaneous TID WC  . loratadine  10 mg Oral Daily  . multivitamin with minerals  1 tablet Oral Daily  . pantoprazole  40 mg Oral Daily  . pramipexole  0.5 mg Oral BID  . simvastatin  20 mg Oral Daily   Continuous Infusions: . sodium chloride 75 mL/hr at 06/17/16 1319  . norepinephrine (LEVOPHED) Adult infusion 7  mcg/min (06/17/16 1215)   PRN Meds:.[DISCONTINUED] acetaminophen **OR** acetaminophen, ALPRAZolam, guaiFENesin-dextromethorphan, nystatin cream, ondansetron **OR** ondansetron (ZOFRAN) IV, senna-docusate, traMADol   Data Review:   Micro Results Recent Results (from the past 240 hour(s))  Blood Culture (routine x 2)     Status: None (Preliminary result)   Collection Time: 06/16/16 11:50 PM   Result Value Ref Range Status   Specimen Description BLOOD  RIGHT AC  Final   Special Requests   Final    BOTTLES DRAWN AEROBIC AND ANAEROBIC  ANA 15 ML AER 13ML   Culture NO GROWTH < 12 HOURS  Final   Report Status PENDING  Incomplete  Blood Culture (routine x 2)     Status: None (Preliminary result)   Collection Time: 06/17/16 12:19 AM  Result Value Ref Range Status   Specimen Description BLOOD LEFT ARM  Final   Special Requests BOTTLES DRAWN AEROBIC AND ANAEROBIC AER ANA 10ML  Final   Culture NO GROWTH < 12 HOURS  Final   Report Status PENDING  Incomplete  MRSA PCR Screening     Status: None   Collection Time: 06/17/16 10:18 AM  Result Value Ref Range Status   MRSA by PCR NEGATIVE NEGATIVE Final    Comment:        The GeneXpert MRSA Assay (FDA approved for NASAL specimens only), is one component of a comprehensive MRSA colonization surveillance program. It is not intended to diagnose MRSA infection nor to guide or monitor treatment for MRSA infections.     Radiology Reports Dg Chest Port 1 View  Result Date: 06/17/2016 CLINICAL DATA:  Dyspnea and weakness today EXAM: PORTABLE CHEST 1 VIEW COMPARISON:  03/18/2016 FINDINGS: Slight patchy opacity in the medial bases may relate to a shallow degree of inspiration. Cannot exclude early infectious infiltrate. Lungs are otherwise clear. Pulmonary vasculature is normal. No pleural effusions. Unchanged mild cardiomegaly IMPRESSION: Mild patchy basilar opacities; cannot exclude early infectious infiltrate. Electronically Signed   By: Andreas Newport M.D.   On: 06/17/2016 00:28     CBC  Recent Labs Lab 06/16/16 2333 06/17/16 0607  WBC 15.0* 20.2*  HGB 12.8 10.5*  HCT 37.4 30.8*  PLT 204 179  MCV 92.3 92.1  MCH 31.5 31.3  MCHC 34.1 34.0  RDW 12.9 12.9  LYMPHSABS 1.8  --   MONOABS 0.7  --   EOSABS 0.1  --   BASOSABS 0.0  --     Chemistries   Recent Labs Lab 06/16/16 2333 06/17/16 0607  NA 136 135  K 4.1 3.8   CL 98* 103  CO2 28 25  GLUCOSE 120* 97  BUN 14 15  CREATININE 1.24* 1.29*  CALCIUM 8.9 7.4*  AST 44*  --   ALT 31  --   ALKPHOS 43  --   BILITOT 0.8  --    ------------------------------------------------------------------------------------------------------------------ estimated creatinine clearance is 40.2 mL/min (by C-G formula based on SCr of 1.29 mg/dL (H)). ------------------------------------------------------------------------------------------------------------------ No results for input(s): HGBA1C in the last 72 hours. ------------------------------------------------------------------------------------------------------------------ No results for input(s): CHOL, HDL, LDLCALC, TRIG, CHOLHDL, LDLDIRECT in the last 72 hours. ------------------------------------------------------------------------------------------------------------------ No results for input(s): TSH, T4TOTAL, T3FREE, THYROIDAB in the last 72 hours.  Invalid input(s): FREET3 ------------------------------------------------------------------------------------------------------------------ No results for input(s): VITAMINB12, FOLATE, FERRITIN, TIBC, IRON, RETICCTPCT in the last 72 hours.  Coagulation profile  Recent Labs Lab 06/16/16 2333  INR 1.05    No results for input(s): DDIMER in the last 72 hours.  Cardiac Enzymes  Recent Labs Lab 06/16/16 2333  06/17/16 0246 06/17/16 1030  TROPONINI 0.04* 0.05* 0.04*   ------------------------------------------------------------------------------------------------------------------ Invalid input(s): POCBNP    Assessment & Plan  Patient is a 75 year old admitted with sepsis due to pneumonia 1. Sepsis due to community-acquired pneumonia, continue therapy with IV Rocephin and azithromycin, wean off Levophed once able to will transfer to the floor 2. Community-acquired pneumonia continue IV antibiotics,  Add mucinex 3. Nausea and vomiting resolved 4.  Leukocytosis secondary to pneumonia 5. Rheumatoid arthritis  6. hypertention bp low bp off 7. DM2  Continue ssi 8. hyperlipdemai continue simvastatin 9. Misc: lovenox for dvt proph     Code Status Orders        Start     Ordered   06/17/16 0200  Full code  Continuous     06/17/16 0200    Code Status History    Date Active Date Inactive Code Status Order ID Comments User Context   08/18/2015  4:28 PM 08/22/2015  4:15 PM Full Code [id number]  Lytle Butte, MD ED   08/03/2015  9:49 AM 08/07/2015  2:11 PM Full Code [id number]  Saundra Shelling, MD Inpatient           Consults none  DVT Prophylaxis  Lovenox    Lab Results  Component Value Date   PLT 179 06/17/2016     Time Spent in minutes   53min Greater than 50% of time spent in care coordination and counseling patient regarding the condition and plan of care.   Dustin Flock M.D on 06/17/2016 at 2:14 PM  Between 7am to 6pm - Pager - [phone]  After 6pm go to [email or web address] - password [email or web address] [facility]   Office  [phone]

## 2016-06-17 NOTE — Progress Notes (Signed)
Admitted at 1000  for Levophed infusion  Levophed .titrated throughtout the day to off. Alert and oriented.ate fair. No nausea or vomitting noted.

## 2016-06-17 NOTE — Consult Note (Addendum)
[facility] Consultation     ASSESSMENT/PLAN   75 yo female s/p LE varicose vein vein laser ablation now with sepsis and hypotension.   PULMONARY A:-- P:   --  CARDIOVASCULAR A: Septic shock with hypotension.  ? Sepsis vs. PE vs other P:  Continue IVF, wean pressors.  Check stat d-dimer elevated, start empiric anticoagulation. CT chest when AKI improved.   RENAL A:  AKI, secondary to above.  P:   --Continue IV hydration.   GASTROINTESTINAL A:  -- P:   --  HEMATOLOGIC A:  Leukocytosis.  P:  Likely reactive.   INFECTIOUS A:  -- P:    Micro/culture results:  BCx2 pending.  UC pending.  Sputum -- MRSA screen pending.   Antibiotics: Ceftriaxone 12/8 Azithromycin 12/8  ENDOCRINE A:  Monitor glucose.  --Rheumatoid arthritis on remicaide.  P:   SSI Empiric steroids.   NEUROLOGIC A:  -- P:   RASS goal: -- --   MAJOR EVENTS/TEST RESULTS:   Best Practices  DVT Prophylaxis: enoxaparin GI Prophylaxis: --   ---------------------------------------  ---------------------------------------   Name: Kaitlyn Huff MRN: [id number] DOB: March 03, 1941    ADMISSION DATE:  06/16/2016 CONSULTATION DATE:  06/17/16  REFERRING MD :  Dr. Estanislado Pandy  CHIEF COMPLAINT:  Hypotension.    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  She had undergone similar on the other leg approximately one month before.  Soon after going home she started to feel ill with nausea, vomiting, cough, tremors/rigors and diminished mental status.    She was initially admitted by Dr. Posey Pronto to the general medical floor. However due to persistent hypotension, ICU service was consulted by ED for admission to the ICU.  The patient is currently awake, alert, she does not remember much from the past 12 hours. She notes that she is feeling better than she was. She denies  chest pain, orthopnea. dyspnea. She has no history of blood clots in the past. However he BP remains low and is currently on levophed drip.  She has no other complaints at this time.    PAST MEDICAL HISTORY :  Past Medical History:  Diagnosis Date  . Anemia   . Diverticulitis   . GERD (gastroesophageal reflux disease)   . Hyperlipidemia   . Hypertension   . Osteoarthritis   . Osteoporosis    actonel  . Positive PPD    s/p INH (2006)  . Rheumatoid arthritis(714.0)    MTX transaminitis, Leflunomide (rash), enbrel, plaquinil, prednisone, remicade, Imuran (transaminitis)  . 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 ABDOMINAL HYSTERECTOMY    . CERVICAL CONE BIOPSY    . CHOLECYSTECTOMY  06/22/14  . COLONOSCOPY WITH PROPOFOL N/A 07/09/2015   Procedure: COLONOSCOPY WITH PROPOFOL;  Surgeon: Manya Silvas, MD;  Location: [facility] ENDOSCOPY;  Service: Endoscopy;  Laterality: N/A;  . ESOPHAGOGASTRODUODENOSCOPY (EGD) WITH PROPOFOL N/A 07/09/2015   Procedure: ESOPHAGOGASTRODUODENOSCOPY (EGD) WITH PROPOFOL;  Surgeon: Manya Silvas, MD;  Location: [facility] ENDOSCOPY;  Service: Endoscopy;  Laterality: N/A;  . OVARY SURGERY    . TRACHEOSTOMY  1959  . TUBAL LIGATION     Prior to Admission medications   Medication Sig Start Date End Date Taking? Authorizing Provider  acidophilus (RISAQUAD) CAPS capsule Take 1 capsule by mouth daily.   Yes Historical Provider, MD  ADVAIR DISKUS 250-50 MCG/DOSE AEPB USE 1 INHALATION EVERY 12 HOURS 02/10/16  Yes [name], MD  albuterol (PROVENTIL) (2.5 MG/3ML) 0.083% nebulizer solution Take 2.5 mg by nebulization every 6 (six) hours as needed for wheezing or shortness of breath.   Yes Historical Provider, MD  alendronate (FOSAMAX) 70 MG tablet Take 70 mg by mouth once a week.  09/04/15  Yes Historical Provider, MD  ALPRAZolam [name]) 0.25 MG tablet Take 1 tablet (0.25 mg total) by mouth daily as needed for  anxiety. 05/09/16  Yes [name], MD  aspirin EC 81 MG tablet Take 81 mg by mouth daily.   Yes Historical Provider, MD  Calcium Carbonate-Vitamin D (CALCIUM 600+D) 600-400 MG-UNIT tablet Take 1 tablet by mouth 2 (two) times daily.   Yes Historical Provider, MD  cetirizine (ZYRTEC) 10 MG tablet Take 1 tablet (10 mg total) by mouth daily. 05/31/16  Yes [name], MD  cholecalciferol (VITAMIN D) 1000 units tablet Take 1,000 Units by mouth daily.   Yes Historical Provider, MD  clonazePAM (KLONOPIN) 0.5 MG tablet Take 0.5 mg by mouth 2 (two) times daily as needed for anxiety.   Yes Historical Provider, MD  colestipol (COLESTID) 1 g tablet Take 2 g by mouth 2 (two) times daily.   Yes Historical Provider, MD  Diphenhyd-Hydrocort-Nystatin (FIRST-DUKES MOUTHWASH) SUSP Pt is to swish and spit 5 cc's tid 06/06/16  Yes Burnard Hawthorne, FNP  FeFum-FePo-FA-B Cmp-C-Zn-Mn-Cu (TANDEM PLUS) 162-115.2-1 MG CAPS Take 1 capsule by mouth daily. 05/31/16  Yes [name], MD  fluticasone (FLONASE) 50 MCG/ACT nasal spray Place 2 sprays into both nostrils daily. 01/27/16  Yes Leone Haven, MD  furosemide (LASIX) 20 MG tablet Take 1 tablet (20 mg total) by mouth daily. 05/31/16  Yes [name], MD  gabapentin (NEURONTIN) 300 MG capsule Take 300 mg by mouth 4 (four) times daily.   Yes Historical Provider, MD  guaiFENesin-dextromethorphan (ROBITUSSIN DM) 100-10 MG/5ML syrup Take 5 mLs by mouth every 4 (four) hours as needed for cough. 08/07/15  Yes Theodoro Grist, MD  hydroxychloroquine (PLAQUENIL) 200 MG tablet Take 1 tablet (200 mg total) by mouth 2 (two) times daily. Patient taking differently: Take 200 mg by mouth daily.  05/13/13  Yes [name], MD  KLOR-CON SPRINKLE 10 MEQ CR capsule TAKE 2 CAPSULES TWICE A DAY 06/09/16  Yes [name], MD  lansoprazole (PREVACID) 30 MG capsule Take 30 mg by mouth 2 (two) times daily before a meal.   Yes Historical Provider, MD  meloxicam (MOBIC) 15 MG tablet  Take 15 mg by mouth daily as needed for pain.   Yes Historical Provider, MD  Multiple Vitamin (MULTIVITAMIN WITH MINERALS) TABS tablet Take 1 tablet by mouth daily.   Yes Historical Provider, MD  nystatin cream (MYCOSTATIN) APPLY 1 APPLICATION TOPICALLY TO THE AFFECTED AREA (OUTER VAGINA) TWICE A DAY 05/30/16  Yes Historical Provider, MD  pramipexole (MIRAPEX) 0.25 MG tablet TAKE 2 TABLETS TWICE A DAY 05/30/16  Yes [name], MD  predniSONE (DELTASONE) 5 MG tablet Take 5 mg by mouth daily.    Yes Historical Provider, MD  pregabalin (LYRICA) 25 MG capsule Take 25-50 mg by mouth daily.   Yes Historical Provider, MD  PROAIR HFA 108 (90 Base) MCG/ACT inhaler USE 2 INHALATIONS EVERY 4 HOURS AS NEEDED FOR WHEEZING OR SHORTNESS OF BREATH 05/13/16  Yes [name], MD  RABEprazole (ACIPHEX) 20 MG tablet Take 1 tablet (20 mg total) by mouth 2 (two) times daily before a meal.  04/08/16  Yes [name], MD  simvastatin (ZOCOR) 20 MG tablet Take 20 mg by mouth daily.    Yes Historical Provider, MD  sodium chloride (OCEAN) 0.65 % nasal spray Place 1 spray into the nose as needed for congestion.   Yes Historical Provider, MD  tiotropium (SPIRIVA) 18 MCG inhalation capsule Place 1 capsule (18 mcg total) into inhaler and inhale daily. 05/31/16 08/29/16 Yes [name], MD  TOPROL XL 50 MG 24 hr tablet TAKE 1 TABLET DAILY. TAKE WITH OR IMMEDIATELY FOLLOWING A MEAL 03/23/16  Yes [name], MD  traMADol (ULTRAM) 50 MG tablet Take 50 mg by mouth 2 (two) times daily as needed for moderate pain.   Yes Historical Provider, MD   Allergies  Allergen Reactions  . Astelin [Azelastine Hcl] Other (See Comments)    Reaction:  Unknown   . Ciprofloxacin Other (See Comments)    Reaction:  GI upset   . Codeine Other (See Comments)    Reaction:  Altered mental status  . Dilaudid [Hydromorphone] Other (See Comments)    Reaction:  Unknown   . Flexeril [Cyclobenzaprine] Other (See Comments)    Reaction:  Unknown     . Imuran [Azathioprine] Other (See Comments)    Reaction:  Abnormal liver function  . Keflex [Cephalexin] Other (See Comments)    Reaction:  GI upset   . Lisinopril Itching  . Lyrica [Pregabalin] Other (See Comments)    Reaction:  Sore gums   . Methotrexate Derivatives Other (See Comments)    Reaction:  Abnormal liver function  . Amoxicillin Rash and Other (See Comments)    Unable to obtain enough information to answer additional questions about this medication.    Jolee Ewing [Leflunomide] Rash  . Clindamycin/Lincomycin Rash  . Doxycycline Rash  . Lodine [Etodolac] Rash  . Percocet [Oxycodone-Acetaminophen] Rash  . Sulfa Antibiotics Rash    FAMILY HISTORY:  Family History  Problem Relation Age of Onset  . Heart disease Father     MI  . Heart disease Mother   . Valvular heart disease Mother   . Breast cancer Sister 42  . Cancer Sister     Lung cancer  . Colon cancer Neg Hx    SOCIAL HISTORY:  reports that she has never smoked. She has never used smokeless tobacco. She reports that she does not drink alcohol or use drugs.  REVIEW OF SYSTEMS:   Constitutional: Feels well. Cardiovascular: No chest pain.  Pulmonary: Denies dyspnea.   The remainder of systems were reviewed and were found to be negative other than what is documented in the HPI.    VITAL SIGNS: Temp:  [98.4 F (36.9 C)-102.3 F (39.1 C)] 98.5 F (36.9 C) (12/08 1014) Pulse Rate:  [36-84] 67 (12/08 1014) Resp:  [10-24] 19 (12/08 1014) BP: (75-146)/(34-75) 114/51 (12/08 1014) SpO2:  [89 %-98 %] 95 % (12/08 1014) Weight:  [77.1 kg (170 lb)-82 kg (180 lb 12.4 oz)] 82 kg (180 lb 12.4 oz) (12/08 1014) HEMODYNAMICS:   VENTILATOR SETTINGS:   INTAKE / OUTPUT:  Intake/Output Summary (Last 24 hours) at 06/17/16 1105 Last data filed at 06/17/16 0412  Gross per 24 hour  Intake             2750 ml  Output                0 ml  Net             2750 ml    Physical Examination:  VS: BP (!) 114/51   Pulse 67    Temp 98.5 F (36.9 C) (Oral)   Resp 19   Ht 5\' 4"  (1.626 m)   Wt 82 kg (180 lb 12.4 oz)   SpO2 95%   BMI 31.03 kg/m   General Appearance: No distress  Neuro:without focal findings, mental status normal.  HEENT: PERRLA, EOM intact, no ptosis, no other lesions noticed;  Pulmonary: normal breath sounds., diaphragmatic excursion normal. CardiovascularNormal S1,S2.  No m/r/g.    Abdomen: Benign, Soft, non-tender, No masses, hepatosplenomegaly, No lymphadenopathy Renal:  No costovertebral tenderness  GU:  Not performed at this time. Endoc: No evident thyromegaly, no signs of acromegaly. Skin:   warm, no rashes, no ecchymosis  Extremities: normal, no cyanosis, bilateral 2+ LE edema.    LABS: Reviewed   LABORATORY PANEL:   CBC  Recent Labs Lab 06/17/16 0607  WBC 20.2*  HGB 10.5*  HCT 30.8*  PLT 179    Chemistries   Recent Labs Lab 06/16/16 2333 06/17/16 0607  NA 136 135  K 4.1 3.8  CL 98* 103  CO2 28 25  GLUCOSE 120* 97  BUN 14 15  CREATININE 1.24* 1.29*  CALCIUM 8.9 7.4*  AST 44*  --   ALT 31  --   ALKPHOS 43  --   BILITOT 0.8  --      Recent Labs Lab 06/17/16 1014  GLUCAP 124*   No results for input(s): PHART, PCO2ART, PO2ART in the last 168 hours.  Recent Labs Lab 06/16/16 2333  AST 44*  ALT 31  ALKPHOS 43  BILITOT 0.8  ALBUMIN 4.2    Cardiac Enzymes  Recent Labs Lab 06/17/16 0246  TROPONINI 0.05*    RADIOLOGY:  Dg Chest Port 1 View  Result Date: 06/17/2016 CLINICAL DATA:  Dyspnea and weakness today EXAM: PORTABLE CHEST 1 VIEW COMPARISON:  03/18/2016 FINDINGS: Slight patchy opacity in the medial bases may relate to a shallow degree of inspiration. Cannot exclude early infectious infiltrate. Lungs are otherwise clear. Pulmonary vasculature is normal. No pleural effusions. Unchanged mild cardiomegaly IMPRESSION: Mild patchy basilar opacities; cannot exclude early infectious infiltrate. Electronically Signed   By: Andreas Newport M.D.    On: 06/17/2016 00:28       --Marda Stalker, MD.  Board Certified in Internal Medicine, Pulmonary Medicine, [place], and Sleep Medicine.  ICU Pager [phone] [facility] Office Number: [phone]  Patricia Pesa, M.D.  Vilinda Boehringer, M.D.  Merton Border, M.D   06/17/2016, 11:05 AM

## 2016-06-17 NOTE — Progress Notes (Signed)
Pharmacy Antibiotic Note  Kaitlyn Huff is a 75 y.o. female admitted on 06/16/2016 with CAP.  Pharmacy has been consulted for azithromycin and ceftriaxone dosing.  Plan: 1. Azithromycin 500 mg IV Q24H 2. Ceftriaxone 1 gm IV Q24H  Height: 5\' 4"  (162.6 cm) Weight: 170 lb (77.1 kg) IBW/kg (Calculated) : 54.7  Temp (24hrs), Avg:102.3 F (39.1 C), Min:102.2 F (39 C), Max:102.3 F (39.1 C)   Recent Labs Lab 06/16/16 2333  WBC 15.0*  CREATININE 1.24*    Estimated Creatinine Clearance: 40.6 mL/min (by C-G formula based on SCr of 1.24 mg/dL (H)).    Allergies  Allergen Reactions  . Astelin [Azelastine Hcl] Other (See Comments)    Reaction:  Unknown   . Ciprofloxacin Other (See Comments)    Reaction:  GI upset   . Codeine Other (See Comments)    Reaction:  Altered mental status  . Dilaudid [Hydromorphone] Other (See Comments)    Reaction:  Unknown   . Flexeril [Cyclobenzaprine] Other (See Comments)    Reaction:  Unknown   . Imuran [Azathioprine] Other (See Comments)    Reaction:  Abnormal liver function  . Keflex [Cephalexin] Other (See Comments)    Reaction:  GI upset   . Lisinopril Itching  . Lyrica [Pregabalin] Other (See Comments)    Reaction:  Sore gums   . Methotrexate Derivatives Other (See Comments)    Reaction:  Abnormal liver function  . Amoxicillin Rash and Other (See Comments)    Unable to obtain enough information to answer additional questions about this medication.    Jolee Ewing [Leflunomide] Rash  . Clindamycin/Lincomycin Rash  . Doxycycline Rash  . Lodine [Etodolac] Rash  . Percocet [Oxycodone-Acetaminophen] Rash  . Sulfa Antibiotics Rash    Thank you for allowing pharmacy to be a part of this patient's care.  Laural Benes, Pharm.D., BCPS Clinical Pharmacist 06/17/2016 12:43 AM

## 2016-06-17 NOTE — Progress Notes (Signed)
[facility] received an order request that Pt in [place] wanted information about Advanced Directives. [place] visited Pt, who was with her daughter in the Rm, explained what AD is and how to complete it; Pt understood the explanation, but requested for more time. [facility] offered to pray for the Pt, pt and her daughter accepted; [place] prayed for Pt and daughter.     06/17/16 1800  Clinical Encounter Type  Visited With Patient;Patient and family together  Visit Type Initial;Spiritual support;ED;Other (Comment)  Referral From Nurse  Consult/Referral To Chaplain  Spiritual Encounters  Spiritual Needs Brochure;Prayer;Other (Comment)  Stress Factors  Patient Stress Factors Health changes;Other (Comment)  Family Stress Factors Health changes;Major life changes;Other (Comment)  Advance Directives (For Healthcare)  Does Patient Have a Medical Advance Directive? No  Would patient like information on creating a medical advance directive? Yes (Inpatient - patient requests chaplain consult to create a medical advance directive)  [place] Directives  Does Patient Have a Mental Health Advance Directive? No  Would patient like information on creating a mental health advance directive? Yes (Inpatient - patient requests chaplain consult to create a mental health advance directive)

## 2016-06-17 NOTE — ED Provider Notes (Signed)
[facility] Emergency Department Provider Note  ____________________________________________  Time seen: Approximately 1:21 AM  I have reviewed the triage vital signs and the nursing notes.   HISTORY  Chief Complaint Nausea and Emesis    HPI Kaitlyn Huff is a 75 y.o. female brought in by EMS due to nausea vomiting and shortness of breath.All started this afternoon. EMS report that her oxygen saturation was low on her usual 2 L nasal cannula so they had to increase it to 4 L. Patient reports a cough and malaise, positive chills.  Had outpatient varicose vein procedure done today without complication.   Past Medical History:  Diagnosis Date  . Anemia   . Diverticulitis   . GERD (gastroesophageal reflux disease)   . Hyperlipidemia   . Hypertension   . Osteoarthritis   . Osteoporosis    actonel  . Positive PPD    s/p INH (2006)  . Rheumatoid arthritis(714.0)    MTX transaminitis, Leflunomide (rash), enbrel, plaquinil, prednisone, remicade, Imuran (transaminitis)  . Valvular heart disease    moderate MR and TR     Patient Active Problem List   Diagnosis Date Noted  . 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 Chronic venous insufficiency 04/25/2016  . Pain in limb 04/25/2016  . Swelling of limb 04/25/2016  . Tachycardia 03/22/2016  . Foot injury 03/18/2016  . Cough 01/27/2016  . Hypoxia 12/20/2015  . Lower extremity edema 12/18/2015  . Diarrhea 09/15/2015  . CAP (community acquired pneumonia) 08/30/2015  . Sepsis ([place]) 08/18/2015  . Restless leg syndrome 08/07/2015  . History of colonic polyps 07/10/2015  . Health care maintenance 01/11/2015  . Weight loss 08/04/2013  . Obstructive sleep apnea 12/23/2012  . Dilated bile duct 12/23/2012  . Hypercholesterolemia 08/12/2012  . Rheumatoid arthritis ([place]) 08/12/2012  . GERD (gastroesophageal reflux disease) 08/12/2012  . Hypertension 08/12/2012  . Anemia  08/12/2012     Past Surgical History:  Procedure Laterality Date  . ABDOMINAL HYSTERECTOMY    . CERVICAL CONE BIOPSY    . CHOLECYSTECTOMY  06/22/14  . COLONOSCOPY WITH PROPOFOL N/A 07/09/2015   Procedure: COLONOSCOPY WITH PROPOFOL;  Surgeon: Manya Silvas, MD;  Location: [facility] ENDOSCOPY;  Service: Endoscopy;  Laterality: N/A;  . ESOPHAGOGASTRODUODENOSCOPY (EGD) WITH PROPOFOL N/A 07/09/2015   Procedure: ESOPHAGOGASTRODUODENOSCOPY (EGD) WITH PROPOFOL;  Surgeon: Manya Silvas, MD;  Location: [facility] ENDOSCOPY;  Service: Endoscopy;  Laterality: N/A;  . OVARY SURGERY    . TRACHEOSTOMY  1959  . TUBAL LIGATION       Prior to Admission medications   Medication Sig Start Date End Date Taking? Authorizing Provider  acidophilus (RISAQUAD) CAPS capsule Take 1 capsule by mouth daily.    Historical Provider, MD  ADVAIR DISKUS 250-50 MCG/DOSE AEPB USE 1 INHALATION EVERY 12 HOURS 02/10/16   [name], MD  albuterol (PROVENTIL) (2.5 MG/3ML) 0.083% nebulizer solution Take 2.5 mg by nebulization every 6 (six) hours as needed for wheezing or shortness of breath.    Historical Provider, MD  alendronate (FOSAMAX) 70 MG tablet Take by mouth. 09/04/15   Historical Provider, MD  ALPRAZolam [name]) 0.25 MG tablet Take 1 tablet (0.25 mg total) by mouth daily as needed for anxiety. 05/09/16   [name], MD  aspirin EC 81 MG tablet Take 81 mg by mouth daily.    Historical Provider, MD  Calcium Carbonate-Vitamin D (CALCIUM 600+D) 600-400 MG-UNIT tablet Take 1 tablet by mouth 2 (two) times daily.  Historical Provider, MD  cetirizine (ZYRTEC) 10 MG tablet Take 1 tablet (10 mg total) by mouth daily. 05/31/16   [name], MD  chlorpheniramine-HYDROcodone (TUSSIONEX) 10-8 MG/5ML SUER Take 5 mLs by mouth every 12 (twelve) hours as needed for cough. 08/07/15   Theodoro Grist, MD  cholecalciferol (VITAMIN D) 1000 units tablet Take 1,000 Units by mouth daily.    Historical Provider, MD  clindamycin (CLEOCIN)  150 MG capsule  06/09/16   Historical Provider, MD  colestipol (COLESTID) 1 g tablet Take 2 g by mouth 2 (two) times daily.    Historical Provider, MD  Diphenhyd-Hydrocort-Nystatin (FIRST-DUKES MOUTHWASH) SUSP Pt is to swish and spit 5 cc's tid 06/06/16   Burnard Hawthorne, FNP  FeFum-FePo-FA-B Cmp-C-Zn-Mn-Cu (TANDEM PLUS) 162-115.2-1 MG CAPS Take 1 capsule by mouth daily. 05/31/16   [name], MD  fluticasone (FLONASE) 50 MCG/ACT nasal spray Place 2 sprays into both nostrils daily. 01/27/16   Leone Haven, MD  Fluticasone-Salmeterol (ADVAIR) 250-50 MCG/DOSE AEPB Inhale 1 puff into the lungs 2 (two) times daily.    Historical Provider, MD  furosemide (LASIX) 20 MG tablet Take 1 tablet (20 mg total) by mouth daily. 05/31/16   [name], MD  furosemide (LASIX) 40 MG tablet Take 40 mg by mouth. Dr [name]    Historical Provider, MD  gabapentin (NEURONTIN) 300 MG capsule Take 300 mg by mouth 4 (four) times daily.    Historical Provider, MD  guaiFENesin-dextromethorphan (ROBITUSSIN DM) 100-10 MG/5ML syrup Take 5 mLs by mouth every 4 (four) hours as needed for cough. 08/07/15   Theodoro Grist, MD  hydroxychloroquine (PLAQUENIL) 200 MG tablet Take 1 tablet (200 mg total) by mouth 2 (two) times daily. 05/13/13   [name], MD  KLOR-CON SPRINKLE 10 MEQ CR capsule TAKE 2 CAPSULES TWICE A DAY 06/09/16   [name], MD  Multiple Vitamin (MULTIVITAMIN WITH MINERALS) TABS tablet Take 1 tablet by mouth daily.    Historical Provider, MD  nystatin cream (MYCOSTATIN) APPLY 1 APPLICATION TOPICALLY TO THE AFFECTED AREA (OUTER VAGINA) TWICE A DAY 05/30/16   [name], MD  nystatin cream (MYCOSTATIN) APPLY 1 APPLICATION TOPICALLY TO THE AFFECTED AREA (OUTER VAGINA) TWICE A DAY 05/30/16   Historical Provider, MD  pramipexole (MIRAPEX) 0.25 MG tablet TAKE 2 TABLETS TWICE A DAY 05/30/16   [name], MD  predniSONE (DELTASONE) 10 MG tablet Take 1 tablet (10 mg total) by mouth daily. Day 1: take  60mg  PO.  Decrease by 10mg  each day until Rx is gone. 02/07/16   Harvest Dark, MD  predniSONE (DELTASONE) 5 MG tablet Take 6 tablets (30mg )  x 1 day and then decrease by 1 tablet each day until down to zero mg. 03/21/16   [name], MD  PROAIR HFA 108 (90 Base) MCG/ACT inhaler USE 2 INHALATIONS EVERY 4 HOURS AS NEEDED FOR WHEEZING OR SHORTNESS OF BREATH 05/13/16   [name], MD  RABEprazole (ACIPHEX) 20 MG tablet Take 1 tablet (20 mg total) by mouth 2 (two) times daily before a meal. 04/08/16   [name], MD  simvastatin (ZOCOR) 20 MG tablet TAKE 1 TABLET DAILY 08/24/15   [name], MD  sodium chloride (OCEAN) 0.65 % nasal spray Place 1 spray into the nose as needed for congestion.    Historical Provider, MD  tiotropium (SPIRIVA) 18 MCG inhalation capsule Place 1 capsule (18 mcg total) into inhaler and inhale daily. 05/31/16 08/29/16  [name], MD  TOPROL XL 50 MG 24 hr tablet TAKE 1  TABLET DAILY. TAKE WITH OR IMMEDIATELY FOLLOWING A MEAL 03/23/16   [name], MD  traMADol (ULTRAM) 50 MG tablet Take 50 mg by mouth 2 (two) times daily as needed for moderate pain.    Historical Provider, MD     Allergies Astelin [azelastine hcl]; Ciprofloxacin; Codeine; Dilaudid [hydromorphone]; Flexeril [cyclobenzaprine]; Imuran [azathioprine]; Keflex [cephalexin]; Lisinopril; Lyrica [pregabalin]; Methotrexate derivatives; Amoxicillin; Arava [leflunomide]; Clindamycin/lincomycin; Doxycycline; Lodine [etodolac]; Percocet [oxycodone-acetaminophen]; and Sulfa antibiotics   Family History  Problem Relation Age of Onset  . Heart disease Father     MI  . Heart disease Mother   . Valvular heart disease Mother   . Breast cancer Sister 55  . Cancer Sister     Lung cancer  . Colon cancer Neg Hx     Social History Social History  Substance Use Topics  . Smoking status: Never Smoker  . Smokeless tobacco: Never Used  . Alcohol use No    Review of Systems  Constitutional:    Positive chills.  ENT:   No sore throat. No rhinorrhea. Cardiovascular:   No chest pain. Respiratory:   Positive shortness of breath and cough Gastrointestinal:   Negative for abdominal pain, positive vomiting.  Genitourinary:   Negative for dysuria or difficulty urinating. Musculoskeletal:   Negative for focal pain. Chronic peripheral edema Neurological:   Positive bilateral frontal headache 10-point ROS otherwise negative.  ____________________________________________   PHYSICAL EXAM:  VITAL SIGNS: ED Triage Vitals  Enc Vitals Group     BP 06/16/16 2334 (!) 146/74     Pulse Rate 06/16/16 2334 84     Resp 06/16/16 2334 20     Temp 06/16/16 2334 (!) 102.3 F (39.1 C)     Temp Source 06/16/16 2334 Oral     SpO2 06/16/16 2328 92 %     Weight 06/16/16 2336 170 lb (77.1 kg)     Height 06/16/16 2336 5\' 4"  (1.626 m)     Head Circumference --      Peak Flow --      Pain Score 06/16/16 2336 0     Pain Loc --      Pain Edu? --      Excl. in [place]? --     Vital signs reviewed, nursing assessments reviewed.   Constitutional:   Alert and oriented. Ill-appearing. Eyes:   No scleral icterus. No conjunctival pallor. PERRL. EOMI.  No nystagmus. ENT   Head:   Normocephalic and atraumatic.   Nose:   No congestion/rhinnorhea. No septal hematoma   Mouth/Throat:   Dry mucous membranes, no pharyngeal erythema. No peritonsillar mass.    Neck:   No stridor. No SubQ emphysema. No meningismus. Hematological/Lymphatic/Immunilogical:   No cervical lymphadenopathy. Cardiovascular:   RRR. Symmetric bilateral radial and DP pulses.  No murmurs.  Respiratory:   Normal respiratory effort without tachypnea nor retractions. Breath sounds are clear and equal bilaterally. No wheezes/rales/rhonchi. Gastrointestinal:   Soft and nontender. Non distended. There is no CVA tenderness.  No rebound, rigidity, or guarding. Genitourinary:   deferred Musculoskeletal:   Nontender with normal range of motion  in all extremities. No joint effusions.  No lower extremity tenderness. 1+ pitting edema bilaterally. No inflammatory changes in the bilateral lower extremities.. Neurologic:   Normal speech and language.  CN 2-10 normal. Motor grossly intact. No gross focal neurologic deficits are appreciated.  Skin:    Skin is warm, dry and intact. No rash noted.  No petechiae, purpura, or bullae. Normal cap refill  ____________________________________________  LABS (pertinent positives/negatives) (all labs ordered are listed, but only abnormal results are displayed) Labs Reviewed  LACTIC ACID, PLASMA - Abnormal; Notable for the following:       Result Value   Lactic Acid, Venous 2.4 (*)    All other components within normal limits  COMPREHENSIVE METABOLIC PANEL - Abnormal; Notable for the following:    Chloride 98 (*)    Glucose, Bld 120 (*)    Creatinine, Ser 1.24 (*)    AST 44 (*)    GFR calc non Af Amer 42 (*)    GFR calc Af Amer 49 (*)    All other components within normal limits  LIPASE, BLOOD - Abnormal; Notable for the following:    Lipase 69 (*)    All other components within normal limits  TROPONIN I - Abnormal; Notable for the following:    Troponin I 0.04 (*)    All other components within normal limits  CBC WITH DIFFERENTIAL/PLATELET - Abnormal; Notable for the following:    WBC 15.0 (*)    Neutro Abs 12.5 (*)    All other components within normal limits  APTT - Abnormal; Notable for the following:    aPTT <24 (*)    All other components within normal limits  CULTURE, BLOOD (ROUTINE X 2)  CULTURE, BLOOD (ROUTINE X 2)  URINE CULTURE  PROTIME-INR  PROCALCITONIN  LACTIC ACID, PLASMA   ____________________________________________   EKG  Interpreted by me Sinus rhythm rate of 84, normal axis intervals QRS ST segments and T waves  ____________________________________________    RADIOLOGY  Chest x-ray interpreted by me, radiology report reviewed. Reveals opacity in  bilateral bases, right greater than left consistent with pneumonia.  ____________________________________________   PROCEDURES Procedures CRITICAL CARE Performed by: Joni Fears, Lillyona Polasek   Total critical care time: 35 minutes  Critical care time was exclusive of separately billable procedures and treating other patients.  Critical care was necessary to treat or prevent imminent or life-threatening deterioration.  Critical care was time spent personally by me on the following activities: development of treatment plan with patient and/or surrogate as well as nursing, discussions with consultants, evaluation of patient's response to treatment, examination of patient, obtaining history from patient or surrogate, ordering and performing treatments and interventions, ordering and review of laboratory studies, ordering and review of radiographic studies, pulse oximetry and re-evaluation of patient's condition.  ____________________________________________   INITIAL IMPRESSION / ASSESSMENT AND PLAN / ED COURSE  Pertinent labs & imaging results that were available during my care of the patient were reviewed by me and considered in my medical decision making (see chart for details).  Patient ill-appearing, presents with fever, likely pneumonia with increased oxygen requirement. Check chest x-ray, sepsis panel. Start IV fluid bolus pending workup.  ----------------------------------------- 1:30 AM on 06/17/2016 -----------------------------------------  Workup consistent with community-acquired pneumonia with infiltrate on x-ray, leukocytosis, fever. Now stabilized on 4 L nasal cannula. Continue fluid boluses due to elevated lactate of 2.4. Ceftriaxone and azithromycin initiated. Case is discussed with hospitalist for admission.     Clinical Course    ____________________________________________   FINAL CLINICAL IMPRESSION(S) / ED DIAGNOSES  Final diagnoses:  Community acquired  pneumonia of right lower lobe of lung ([place])  Sepsis, due to unspecified organism [facility])       Portions of this note were generated with dragon dictation software. Dictation errors may occur despite best attempts at proofreading.    Carrie Mew, MD 06/17/16 0130

## 2016-06-17 NOTE — Progress Notes (Signed)
ANTIBIOTIC CONSULT NOTE - INITIAL  Pharmacy Consult for Rocephin and Azithromycin Indication: pneumonia   Allergies  Allergen Reactions  . Astelin [Azelastine Hcl] Other (See Comments)    Reaction:  Unknown   . Ciprofloxacin Other (See Comments)    Reaction:  GI upset   . Codeine Other (See Comments)    Reaction:  Altered mental status  . Dilaudid [Hydromorphone] Other (See Comments)    Reaction:  Unknown   . Flexeril [Cyclobenzaprine] Other (See Comments)    Reaction:  Unknown   . Imuran [Azathioprine] Other (See Comments)    Reaction:  Abnormal liver function  . Keflex [Cephalexin] Other (See Comments)    Reaction:  GI upset   . Lisinopril Itching  . Lyrica [Pregabalin] Other (See Comments)    Reaction:  Sore gums   . Methotrexate Derivatives Other (See Comments)    Reaction:  Abnormal liver function  . Amoxicillin Rash and Other (See Comments)    Unable to obtain enough information to answer additional questions about this medication.    Jolee Ewing [Leflunomide] Rash  . Clindamycin/Lincomycin Rash  . Doxycycline Rash  . Lodine [Etodolac] Rash  . Percocet [Oxycodone-Acetaminophen] Rash  . Sulfa Antibiotics Rash    Patient Measurements: Height: 5\' 4"  (162.6 cm) Weight: 180 lb 12.4 oz (82 kg) IBW/kg (Calculated) : 54.7 Adjusted Body Weight:   Vital Signs: Temp: 98.5 F (36.9 C) (12/08 1014) Temp Source: Oral (12/08 1014) BP: 114/51 (12/08 1014) Pulse Rate: 67 (12/08 1014) Intake/Output from previous day: 12/07 0701 - 12/08 0700 In: 2750 [IV Piggyback:2750] Out: -  Intake/Output from this shift: No intake/output data recorded.  Labs:  Recent Labs  06/16/16 2333 06/17/16 0607  WBC 15.0* 20.2*  HGB 12.8 10.5*  PLT 204 179  CREATININE 1.24* 1.29*   Estimated Creatinine Clearance: 40.2 mL/min (by C-G formula based on SCr of 1.29 mg/dL (H)). No results for input(s): VANCOTROUGH, VANCOPEAK, VANCORANDOM, GENTTROUGH, GENTPEAK, GENTRANDOM, TOBRATROUGH, TOBRAPEAK,  TOBRARND, AMIKACINPEAK, AMIKACINTROU, AMIKACIN in the last 72 hours.   Microbiology: Recent Results (from the past 720 hour(s))  Blood Culture (routine x 2)     Status: None (Preliminary result)   Collection Time: 06/16/16 11:50 PM  Result Value Ref Range Status   Specimen Description BLOOD  RIGHT AC  Final   Special Requests   Final    BOTTLES DRAWN AEROBIC AND ANAEROBIC  ANA 15 ML AER 13ML   Culture NO GROWTH < 12 HOURS  Final   Report Status PENDING  Incomplete  Blood Culture (routine x 2)     Status: None (Preliminary result)   Collection Time: 06/17/16 12:19 AM  Result Value Ref Range Status   Specimen Description BLOOD LEFT ARM  Final   Special Requests BOTTLES DRAWN AEROBIC AND ANAEROBIC AER ANA 10ML  Final   Culture NO GROWTH < 12 HOURS  Final   Report Status PENDING  Incomplete    Medical History: Past Medical History:  Diagnosis Date  . Anemia   . Diverticulitis   . GERD (gastroesophageal reflux disease)   . Hyperlipidemia   . Hypertension   . Osteoarthritis   . Osteoporosis    actonel  . Positive PPD    s/p INH (2006)  . Rheumatoid arthritis(714.0)    MTX transaminitis, Leflunomide (rash), enbrel, plaquinil, prednisone, remicade, Imuran (transaminitis)  . Valvular heart disease    moderate MR and TR    Medications:  Prescriptions Prior to Admission  Medication Sig Dispense Refill Last Dose  .  acidophilus (RISAQUAD) CAPS capsule Take 1 capsule by mouth daily.   unknown at unknown  . ADVAIR DISKUS 250-50 MCG/DOSE AEPB USE 1 INHALATION EVERY 12 HOURS 180 each 0 unknown at unknown  . albuterol (PROVENTIL) (2.5 MG/3ML) 0.083% nebulizer solution Take 2.5 mg by nebulization every 6 (six) hours as needed for wheezing or shortness of breath.   unknown at unknown  . alendronate (FOSAMAX) 70 MG tablet Take 70 mg by mouth once a week.    unknown at unknown  . ALPRAZolam (XANAX) 0.25 MG tablet Take 1 tablet (0.25 mg total) by mouth daily as needed for anxiety. 90 tablet 0  unknown at unknown  . aspirin EC 81 MG tablet Take 81 mg by mouth daily.   unknown at unknown  . Calcium Carbonate-Vitamin D (CALCIUM 600+D) 600-400 MG-UNIT tablet Take 1 tablet by mouth 2 (two) times daily.   unknown at unknown  . cetirizine (ZYRTEC) 10 MG tablet Take 1 tablet (10 mg total) by mouth daily. 90 tablet 1 unknown at unknown  . cholecalciferol (VITAMIN D) 1000 units tablet Take 1,000 Units by mouth daily.   unknown at unknown  . clonazePAM (KLONOPIN) 0.5 MG tablet Take 0.5 mg by mouth 2 (two) times daily as needed for anxiety.   unknown at unknown  . colestipol (COLESTID) 1 g tablet Take 2 g by mouth 2 (two) times daily.   unknown at unknown  . Diphenhyd-Hydrocort-Nystatin (FIRST-DUKES MOUTHWASH) SUSP Pt is to swish and spit 5 cc's tid 237 mL 0 prn at prn  . FeFum-FePo-FA-B Cmp-C-Zn-Mn-Cu (TANDEM PLUS) 162-115.2-1 MG CAPS Take 1 capsule by mouth daily. 90 each 0 unknown at unknown  . fluticasone (FLONASE) 50 MCG/ACT nasal spray Place 2 sprays into both nostrils daily. 16 g 6 unknown at unknown  . furosemide (LASIX) 20 MG tablet Take 1 tablet (20 mg total) by mouth daily. 90 tablet 0 unknown at unknown  . gabapentin (NEURONTIN) 300 MG capsule Take 300 mg by mouth 4 (four) times daily.   unknown at unknown  . guaiFENesin-dextromethorphan (ROBITUSSIN DM) 100-10 MG/5ML syrup Take 5 mLs by mouth every 4 (four) hours as needed for cough. 118 mL 0 prn at prn  . hydroxychloroquine (PLAQUENIL) 200 MG tablet Take 1 tablet (200 mg total) by mouth 2 (two) times daily. (Patient taking differently: Take 200 mg by mouth daily. ) 180 tablet 1 unknown at unknown  . KLOR-CON SPRINKLE 10 MEQ CR capsule TAKE 2 CAPSULES TWICE A DAY 360 capsule 3 unknown at unknown  . lansoprazole (PREVACID) 30 MG capsule Take 30 mg by mouth 2 (two) times daily before a meal.   unknown at unknown  . meloxicam (MOBIC) 15 MG tablet Take 15 mg by mouth daily as needed for pain.   prn at prn  . Multiple Vitamin (MULTIVITAMIN  WITH MINERALS) TABS tablet Take 1 tablet by mouth daily.   unknown at unknown  . nystatin cream (MYCOSTATIN) APPLY 1 APPLICATION TOPICALLY TO THE AFFECTED AREA (OUTER VAGINA) TWICE A DAY   unknown at unknown  . pramipexole (MIRAPEX) 0.25 MG tablet TAKE 2 TABLETS TWICE A DAY 180 tablet 2 unknown at unknown  . predniSONE (DELTASONE) 5 MG tablet Take 5 mg by mouth daily.    unknown at unknown  . pregabalin (LYRICA) 25 MG capsule Take 25-50 mg by mouth daily.   unknown at unknown  . PROAIR HFA 108 (90 Base) MCG/ACT inhaler USE 2 INHALATIONS EVERY 4 HOURS AS NEEDED FOR WHEEZING OR SHORTNESS OF BREATH 25.5  g 0 prn at prn  . RABEprazole (ACIPHEX) 20 MG tablet Take 1 tablet (20 mg total) by mouth 2 (two) times daily before a meal. 60 tablet 1 unknown at unknown  . simvastatin (ZOCOR) 20 MG tablet Take 20 mg by mouth daily.    unknown at unknown  . sodium chloride (OCEAN) 0.65 % nasal spray Place 1 spray into the nose as needed for congestion.   prn at prn  . tiotropium (SPIRIVA) 18 MCG inhalation capsule Place 1 capsule (18 mcg total) into inhaler and inhale daily. 90 capsule 0 unknown at unknown  . TOPROL XL 50 MG 24 hr tablet TAKE 1 TABLET DAILY. TAKE WITH OR IMMEDIATELY FOLLOWING A MEAL 90 tablet 1 unknown at unknown  . traMADol (ULTRAM) 50 MG tablet Take 50 mg by mouth 2 (two) times daily as needed for moderate pain.   unknown at unknown   Scheduled:  . acidophilus  1 capsule Oral Daily  . aspirin EC  81 mg Oral Daily  . [START ON 06/18/2016] azithromycin  500 mg Intravenous Q24H  . calcium citrate-vitamin D  1 tablet Oral BID  . [START ON 06/18/2016] cefTRIAXone (ROCEPHIN)  IV  1 g Intravenous Q24H  . cholecalciferol  1,000 Units Oral Daily  . colestipol  2 g Oral BID  . enoxaparin (LOVENOX) injection  40 mg Subcutaneous QHS  . fluticasone  2 spray Each Nare Daily  . gabapentin  300 mg Oral TID WC & HS  . hydroxychloroquine  200 mg Oral BID  . loratadine  10 mg Oral Daily  . metoprolol succinate   50 mg Oral Daily  . multivitamin with minerals  1 tablet Oral Daily  . nystatin cream   Topical BID  . pantoprazole  40 mg Oral Daily  . pramipexole  0.5 mg Oral BID  . simvastatin  20 mg Oral Daily   Infusions:  . sodium chloride 75 mL/hr at 06/17/16 0530   Assessment: Pharmacy consulted to dose and monitor azithromycin and rocephin  Goal of Therapy:    Plan:  Will start Azithromycin 500 mg IV and Ceftriaxone 1 g IV q24 hours on 12/9. Patient received both medications upon admission on 12/8. RN to watch closely for any signs and symptoms of allergic reaction. Patient received 1 dose of Rocephin this am without any documented reaction.   Eluterio Seymour D 06/17/2016,11:00 AM

## 2016-06-17 NOTE — ED Notes (Addendum)
Levophed started at 90mcg/min. B/P 84/50

## 2016-06-17 NOTE — ED Notes (Signed)
Pt has ready bed on inpatient unit. Spoke with Dr Joni Fears regarding the downward trend in pt's BP over the last 1-2 hrs. Plan is to keep pt in the ED until BP reaches a level WDL. Will call inpatient unit to make them aware of POC at this time.

## 2016-06-17 NOTE — ED Notes (Signed)
LEVOPHED started at 56mcg/min. B/P 102/40

## 2016-06-17 NOTE — H&P (Signed)
[facility] [facility] [place] NAME: Kaitlyn Huff    MR#:  [id number]  DATE OF BIRTH:  Dec 12, 1940  DATE OF ADMISSION:  06/16/2016  PRIMARY CARE PHYSICIAN: [name], MD   REQUESTING/REFERRING PHYSICIAN:   CHIEF COMPLAINT:   Chief Complaint  Patient presents with  . Nausea  . Emesis    HISTORY OF PRESENT ILLNESS: Tkai Tiley  is a 75 y.o. female with a known history of GERD, hyperlipidemia, hypertension, osteoarthritis, osteoporosis, valvular heart disease presented to the emergency room with generalized weakness and fever. Patient also had some nausea and vomiting. Patient was febrile with 102.39F. No history of any recent travel or sick contacts at home. Complaints of cough productive for yellowish phlegm. Patient also says she was had difficulty breathing. Patient uses oxygen at bedtime and has history of COPD. Patient was evaluated in the emergency room was found to have pneumonia and lactate level was elevated. Patient was started on IV fluids based on sepsis protocol and she received IV antibiotics. Patient had a varicose vein stripped of the right leg yesterday at the clinic .Hospitalist service was consulted for further care of the patient. No complaints of any chest pain. No headache dizziness or blurry vision. No history of any syncope or seizure.  PAST MEDICAL HISTORY:   Past Medical History:  Diagnosis Date  . Anemia   . Diverticulitis   . GERD (gastroesophageal reflux disease)   . Hyperlipidemia   . Hypertension   . Osteoarthritis   . Osteoporosis    actonel  . Positive PPD    s/p INH (2006)  . Rheumatoid arthritis(714.0)    MTX transaminitis, Leflunomide (rash), enbrel, plaquinil, prednisone, remicade, Imuran (transaminitis)  . Valvular heart disease    moderate MR and TR    PAST SURGICAL HISTORY: Past Surgical History:  Procedure Laterality Date  . ABDOMINAL HYSTERECTOMY    . CERVICAL CONE BIOPSY    .  CHOLECYSTECTOMY  06/22/14  . COLONOSCOPY WITH PROPOFOL N/A 07/09/2015   Procedure: COLONOSCOPY WITH PROPOFOL;  Surgeon: Manya Silvas, MD;  Location: [facility] ENDOSCOPY;  Service: Endoscopy;  Laterality: N/A;  . ESOPHAGOGASTRODUODENOSCOPY (EGD) WITH PROPOFOL N/A 07/09/2015   Procedure: ESOPHAGOGASTRODUODENOSCOPY (EGD) WITH PROPOFOL;  Surgeon: Manya Silvas, MD;  Location: [facility] ENDOSCOPY;  Service: Endoscopy;  Laterality: N/A;  . OVARY SURGERY    . TRACHEOSTOMY  1959  . TUBAL LIGATION      SOCIAL HISTORY:  Social History  Substance Use Topics  . Smoking status: Never Smoker  . Smokeless tobacco: Never Used  . Alcohol use No    FAMILY HISTORY:  Family History  Problem Relation Age of Onset  . Heart disease Father     MI  . Heart disease Mother   . Valvular heart disease Mother   . Breast cancer Sister 24  . Cancer Sister     Lung cancer  . Colon cancer Neg Hx     DRUG ALLERGIES:  Allergies  Allergen Reactions  . Astelin [Azelastine Hcl] Other (See Comments)    Reaction:  Unknown   . Ciprofloxacin Other (See Comments)    Reaction:  GI upset   . Codeine Other (See Comments)    Reaction:  Altered mental status  . Dilaudid [Hydromorphone] Other (See Comments)    Reaction:  Unknown   . Flexeril [Cyclobenzaprine] Other (See Comments)    Reaction:  Unknown   . Imuran [Azathioprine] Other (See Comments)    Reaction:  Abnormal  liver function  . Keflex [Cephalexin] Other (See Comments)    Reaction:  GI upset   . Lisinopril Itching  . Lyrica [Pregabalin] Other (See Comments)    Reaction:  Sore gums   . Methotrexate Derivatives Other (See Comments)    Reaction:  Abnormal liver function  . Amoxicillin Rash and Other (See Comments)    Unable to obtain enough information to answer additional questions about this medication.    Jolee Ewing [Leflunomide] Rash  . Clindamycin/Lincomycin Rash  . Doxycycline Rash  . Lodine [Etodolac] Rash  . Percocet [Oxycodone-Acetaminophen] Rash   . Sulfa Antibiotics Rash    REVIEW OF SYSTEMS:   CONSTITUTIONAL: Has fever, weakness.  EYES: No blurred or double vision.  EARS, NOSE, AND THROAT: No tinnitus or ear pain.  RESPIRATORY: Has cough, shortness of breath,  No wheezing or hemoptysis.  CARDIOVASCULAR: No chest pain, orthopnea, edema.  GASTROINTESTINAL: Has nausea, vomiting, No diarrhea or abdominal pain.  GENITOURINARY: No dysuria, hematuria.  ENDOCRINE: No polyuria, nocturia,  HEMATOLOGY: No anemia, easy bruising or bleeding SKIN: No rash or lesion. MUSCULOSKELETAL: Has varicose veins stripped right leg  NEUROLOGIC: No tingling, numbness, weakness.  PSYCHIATRY: No anxiety or depression.   MEDICATIONS AT HOME:  Prior to Admission medications   Medication Sig Start Date End Date Taking? Authorizing Provider  acidophilus (RISAQUAD) CAPS capsule Take 1 capsule by mouth daily.    Historical Provider, MD  ADVAIR DISKUS 250-50 MCG/DOSE AEPB USE 1 INHALATION EVERY 12 HOURS 02/10/16   [name], MD  albuterol (PROVENTIL) (2.5 MG/3ML) 0.083% nebulizer solution Take 2.5 mg by nebulization every 6 (six) hours as needed for wheezing or shortness of breath.    Historical Provider, MD  alendronate (FOSAMAX) 70 MG tablet Take by mouth. 09/04/15   Historical Provider, MD  ALPRAZolam [name]) 0.25 MG tablet Take 1 tablet (0.25 mg total) by mouth daily as needed for anxiety. 05/09/16   [name], MD  aspirin EC 81 MG tablet Take 81 mg by mouth daily.    Historical Provider, MD  Calcium Carbonate-Vitamin D (CALCIUM 600+D) 600-400 MG-UNIT tablet Take 1 tablet by mouth 2 (two) times daily.    Historical Provider, MD  cetirizine (ZYRTEC) 10 MG tablet Take 1 tablet (10 mg total) by mouth daily. 05/31/16   [name], MD  chlorpheniramine-HYDROcodone (TUSSIONEX) 10-8 MG/5ML SUER Take 5 mLs by mouth every 12 (twelve) hours as needed for cough. 08/07/15   Theodoro Grist, MD  cholecalciferol (VITAMIN D) 1000 units tablet Take 1,000 Units  by mouth daily.    Historical Provider, MD  clindamycin (CLEOCIN) 150 MG capsule  06/09/16   Historical Provider, MD  colestipol (COLESTID) 1 g tablet Take 2 g by mouth 2 (two) times daily.    Historical Provider, MD  Diphenhyd-Hydrocort-Nystatin (FIRST-DUKES MOUTHWASH) SUSP Pt is to swish and spit 5 cc's tid 06/06/16   Burnard Hawthorne, FNP  FeFum-FePo-FA-B Cmp-C-Zn-Mn-Cu (TANDEM PLUS) 162-115.2-1 MG CAPS Take 1 capsule by mouth daily. 05/31/16   [name], MD  fluticasone (FLONASE) 50 MCG/ACT nasal spray Place 2 sprays into both nostrils daily. 01/27/16   Leone Haven, MD  Fluticasone-Salmeterol (ADVAIR) 250-50 MCG/DOSE AEPB Inhale 1 puff into the lungs 2 (two) times daily.    Historical Provider, MD  furosemide (LASIX) 20 MG tablet Take 1 tablet (20 mg total) by mouth daily. 05/31/16   [name], MD  furosemide (LASIX) 40 MG tablet Take 40 mg by mouth. Dr [name]    Historical Provider, MD  gabapentin (  NEURONTIN) 300 MG capsule Take 300 mg by mouth 4 (four) times daily.    Historical Provider, MD  guaiFENesin-dextromethorphan (ROBITUSSIN DM) 100-10 MG/5ML syrup Take 5 mLs by mouth every 4 (four) hours as needed for cough. 08/07/15   Theodoro Grist, MD  hydroxychloroquine (PLAQUENIL) 200 MG tablet Take 1 tablet (200 mg total) by mouth 2 (two) times daily. 05/13/13   [name], MD  KLOR-CON SPRINKLE 10 MEQ CR capsule TAKE 2 CAPSULES TWICE A DAY 06/09/16   [name], MD  Multiple Vitamin (MULTIVITAMIN WITH MINERALS) TABS tablet Take 1 tablet by mouth daily.    Historical Provider, MD  nystatin cream (MYCOSTATIN) APPLY 1 APPLICATION TOPICALLY TO THE AFFECTED AREA (OUTER VAGINA) TWICE A DAY 05/30/16   [name], MD  nystatin cream (MYCOSTATIN) APPLY 1 APPLICATION TOPICALLY TO THE AFFECTED AREA (OUTER VAGINA) TWICE A DAY 05/30/16   Historical Provider, MD  pramipexole (MIRAPEX) 0.25 MG tablet TAKE 2 TABLETS TWICE A DAY 05/30/16   [name], MD  predniSONE (DELTASONE) 10  MG tablet Take 1 tablet (10 mg total) by mouth daily. Day 1: take 60mg  PO.  Decrease by 10mg  each day until Rx is gone. 02/07/16   Harvest Dark, MD  predniSONE (DELTASONE) 5 MG tablet Take 6 tablets (30mg )  x 1 day and then decrease by 1 tablet each day until down to zero mg. 03/21/16   [name], MD  PROAIR HFA 108 (90 Base) MCG/ACT inhaler USE 2 INHALATIONS EVERY 4 HOURS AS NEEDED FOR WHEEZING OR SHORTNESS OF BREATH 05/13/16   [name], MD  RABEprazole (ACIPHEX) 20 MG tablet Take 1 tablet (20 mg total) by mouth 2 (two) times daily before a meal. 04/08/16   [name], MD  simvastatin (ZOCOR) 20 MG tablet TAKE 1 TABLET DAILY 08/24/15   [name], MD  sodium chloride (OCEAN) 0.65 % nasal spray Place 1 spray into the nose as needed for congestion.    Historical Provider, MD  tiotropium (SPIRIVA) 18 MCG inhalation capsule Place 1 capsule (18 mcg total) into inhaler and inhale daily. 05/31/16 08/29/16  [name], MD  TOPROL XL 50 MG 24 hr tablet TAKE 1 TABLET DAILY. TAKE WITH OR IMMEDIATELY FOLLOWING A MEAL 03/23/16   [name], MD  traMADol (ULTRAM) 50 MG tablet Take 50 mg by mouth 2 (two) times daily as needed for moderate pain.    Historical Provider, MD      PHYSICAL EXAMINATION:   VITAL SIGNS: Blood pressure (!) 144/68, pulse 79, temperature (!) 102.2 F (39 C), temperature source Oral, resp. rate 20, height 5\' 4"  (1.626 m), weight 77.1 kg (170 lb), SpO2 96 %.  GENERAL:  75 y.o.-year-old patient lying in the bed in mild distress.  EYES: Pupils equal, round, reactive to light and accommodation. No scleral icterus. Extraocular muscles intact.  HEENT: Head atraumatic, normocephalic. Oropharynx and nasopharynx clear.  NECK:  Supple, no jugular venous distention. No thyroid enlargement, no tenderness.  LUNGS: Decreased breath sounds bilaterally, rales heard in right lung. No use of accessory muscles of respiration.  CARDIOVASCULAR: S1, S2 normal. No murmurs, rubs, or  gallops.  ABDOMEN: Soft, nontender, nondistended. Bowel sounds present. No organomegaly or mass.  EXTREMITIES: No pedal edema, cyanosis, or clubbing.  Bandage to right leg where varicose veins were stripped. NEUROLOGIC: Cranial nerves II through XII are intact. Muscle strength 5/5 in all extremities. Sensation intact. Gait not checked.  PSYCHIATRIC: The patient is alert and oriented x 3.  SKIN: No obvious rash, lesion, or  ulcer.   LABORATORY PANEL:   CBC  Recent Labs Lab 06/16/16 2333  WBC 15.0*  HGB 12.8  HCT 37.4  PLT 204  MCV 92.3  MCH 31.5  MCHC 34.1  RDW 12.9  LYMPHSABS 1.8  MONOABS 0.7  EOSABS 0.1  BASOSABS 0.0   ------------------------------------------------------------------------------------------------------------------  Chemistries   Recent Labs Lab 06/16/16 2333  NA 136  K 4.1  CL 98*  CO2 28  GLUCOSE 120*  BUN 14  CREATININE 1.24*  CALCIUM 8.9  AST 44*  ALT 31  ALKPHOS 43  BILITOT 0.8   ------------------------------------------------------------------------------------------------------------------ estimated creatinine clearance is 40.6 mL/min (by C-G formula based on SCr of 1.24 mg/dL (H)). ------------------------------------------------------------------------------------------------------------------ No results for input(s): TSH, T4TOTAL, T3FREE, THYROIDAB in the last 72 hours.  Invalid input(s): FREET3   Coagulation profile  Recent Labs Lab 06/16/16 2333  INR 1.05   ------------------------------------------------------------------------------------------------------------------- No results for input(s): DDIMER in the last 72 hours. -------------------------------------------------------------------------------------------------------------------  Cardiac Enzymes  Recent Labs Lab 06/16/16 2333  TROPONINI 0.04*    ------------------------------------------------------------------------------------------------------------------ Invalid input(s): POCBNP  ---------------------------------------------------------------------------------------------------------------  Urinalysis    Component Value Date/Time   COLORURINE YELLOW (A) 08/18/2015 1738   APPEARANCEUR CLEAR (A) 08/18/2015 1738   APPEARANCEUR Clear 11/02/2014 0225   LABSPEC 1.013 08/18/2015 1738   LABSPEC 1.010 11/02/2014 0225   PHURINE 7.0 08/18/2015 1738   GLUCOSEU NEGATIVE 08/18/2015 1738   GLUCOSEU Negative 11/02/2014 0225   HGBUR NEGATIVE 08/18/2015 1738   BILIRUBINUR NEGATIVE 08/18/2015 1738   BILIRUBINUR Negative 11/02/2014 0225   KETONESUR NEGATIVE 08/18/2015 1738   PROTEINUR NEGATIVE 08/18/2015 1738   NITRITE NEGATIVE 08/18/2015 1738   LEUKOCYTESUR TRACE (A) 08/18/2015 1738   LEUKOCYTESUR Negative 11/02/2014 0225     RADIOLOGY: Dg Chest Port 1 View  Result Date: 06/17/2016 CLINICAL DATA:  Dyspnea and weakness today EXAM: PORTABLE CHEST 1 VIEW COMPARISON:  03/18/2016 FINDINGS: Slight patchy opacity in the medial bases may relate to a shallow degree of inspiration. Cannot exclude early infectious infiltrate. Lungs are otherwise clear. Pulmonary vasculature is normal. No pleural effusions. Unchanged mild cardiomegaly IMPRESSION: Mild patchy basilar opacities; cannot exclude early infectious infiltrate. Electronically Signed   By: Andreas Newport M.D.   On: 06/17/2016 00:28    EKG: Orders placed or performed during the hospital encounter of 06/16/16  . EKG 12-Lead  . EKG 12-Lead  . ED EKG  . ED EKG    IMPRESSION AND PLAN: 75 year old female patient with history of for hypertension, arthritis, valvular heart disease, rheumatoid arthritis, osteoporosis, osteoarthritis presented to the emergency room with nausea, vomiting and difficulty breathing and cough. Admitting diagnosis 1. Sepsis 2. Community-acquired  pneumonia 3. Nausea and vomiting 4. Leukocytosis secondary to pneumonia 5. Rheumatoid arthritis Treatment plan Admit patient to medical floor inpatient service IV fluid hydration Start patient on IV Rocephin and IV Zithromax antibiotics Follow-up WBC count Antiemetics DVT prophylaxis with subcutaneous Lovenox 40 MG daily Check flu swab. Follow-up cultures Supportive care.  All the records are reviewed and case discussed with ED provider. Management plans discussed with the patient, family and they are in agreement.  CODE STATUS:FULL CODE    Code Status Orders        Start     Ordered   06/17/16 0200  Full code  Continuous     06/17/16 0200    Code Status History    Date Active Date Inactive Code Status Order ID Comments User Context   08/18/2015  4:28 PM 08/22/2015  4:15 PM Full Code [id number]  Shanon Brow  Woodfin Ganja, MD ED   08/03/2015  9:49 AM 08/07/2015  2:11 PM Full Code [id number]  Saundra Shelling, MD Inpatient       TOTAL TIME TAKING CARE OF THIS PATIENT: 53 minutes.    Saundra Shelling M.D on 06/17/2016 at 2:01 AM  Between 7am to 6pm - Pager - [phone]  After 6pm go to [email or web address] - password [email or web address] [facility]  Office  [phone]  CC: Primary care physician; [name], MD

## 2016-06-18 LAB — BASIC METABOLIC PANEL
Anion gap: 3 — ABNORMAL LOW (ref 5–15)
BUN: 17 mg/dL (ref 6–20)
CO2: 27 mmol/L (ref 22–32)
CREATININE: 0.95 mg/dL (ref 0.44–1.00)
Calcium: 8.5 mg/dL — ABNORMAL LOW (ref 8.9–10.3)
Chloride: 109 mmol/L (ref 101–111)
GFR calc Af Amer: >60 mL/min (ref >60)
GFR, EST NON AFRICAN AMERICAN: 58 mL/min — AB (ref >60)
Glucose, Bld: 113 mg/dL — ABNORMAL HIGH (ref 65–99)
POTASSIUM: 4 mmol/L (ref 3.5–5.1)
SODIUM: 139 mmol/L (ref 135–145)

## 2016-06-18 LAB — CBC
HCT: 27.8 % — ABNORMAL LOW (ref 35.0–47.0)
Hemoglobin: 9.7 g/dL — ABNORMAL LOW (ref 12.0–16.0)
MCH: 32.5 pg (ref 26.0–34.0)
MCHC: 34.9 g/dL (ref 32.0–36.0)
MCV: 93 fL (ref 80.0–100.0)
PLATELETS: 140 10*3/uL — AB (ref 150–440)
RBC: 2.99 MIL/uL — AB (ref 3.80–5.20)
RDW: 13 % (ref 11.5–14.5)
WBC: 12.2 10*3/uL — ABNORMAL HIGH (ref 3.6–11.0)

## 2016-06-18 LAB — GLUCOSE, CAPILLARY: Glucose-Capillary: 129 mg/dL — ABNORMAL HIGH (ref 65–99)

## 2016-06-18 LAB — URINE CULTURE

## 2016-06-18 LAB — PHOSPHORUS: Phosphorus: 3.6 mg/dL (ref 2.5–4.6)

## 2016-06-18 LAB — MAGNESIUM: MAGNESIUM: 1.8 mg/dL (ref 1.7–2.4)

## 2016-06-18 MED ORDER — ALPRAZOLAM 0.25 MG PO TABS
0.2500 mg | ORAL_TABLET | Freq: Once | ORAL | Status: AC
  Administered 2016-06-18: 0.25 mg via ORAL
  Filled 2016-06-18: qty 1

## 2016-06-18 MED ORDER — AZITHROMYCIN 250 MG PO TABS
500.0000 mg | ORAL_TABLET | Freq: Every day | ORAL | Status: AC
  Administered 2016-06-19 – 2016-06-21 (×3): 500 mg via ORAL
  Filled 2016-06-18: qty 1
  Filled 2016-06-18 (×2): qty 2

## 2016-06-18 MED ORDER — DILTIAZEM HCL 25 MG/5ML IV SOLN
10.0000 mg | Freq: Once | INTRAVENOUS | Status: AC
  Administered 2016-06-18: 10 mg via INTRAVENOUS
  Filled 2016-06-18: qty 5

## 2016-06-18 MED ORDER — HYDROCORTISONE NA SUCCINATE PF 100 MG IJ SOLR
50.0000 mg | Freq: Every day | INTRAMUSCULAR | Status: DC
  Administered 2016-06-19: 50 mg via INTRAVENOUS
  Filled 2016-06-18: qty 2

## 2016-06-18 MED ORDER — PROMETHAZINE HCL 25 MG/ML IJ SOLN
12.5000 mg | Freq: Once | INTRAMUSCULAR | Status: AC
  Administered 2016-06-18: 12.5 mg via INTRAVENOUS
  Filled 2016-06-18: qty 1

## 2016-06-18 MED ORDER — MAGIC MOUTHWASH
10.0000 mL | Freq: Four times a day (QID) | ORAL | Status: DC | PRN
  Filled 2016-06-18: qty 10

## 2016-06-18 NOTE — Progress Notes (Deleted)
[facility] Consultation     ASSESSMENT/PLAN   75 yo female s/p LE varicose vein vein laser ablation now with sepsis and hypotension.   PULMONARY A: Community-acquired pneumonia-cough and congestion improved Mild hypoxemia when sleeping P:   When necessary supplemental oxygen Continue antibiotics  CARDIOVASCULAR A:  Septic shock with hypotension.  ? Sepsis vs. PE vs other-low probability for PE, patient is in no respiratory distress Varicose vein ablation P:  Off pressors D-dimer elevated, however, patient just had varicose vein ablation. Continue full strength Lovenox Follow-up with vascular surgery  RENAL A:   AKI, secondary to sepsis P: Continue IV hydration Trend creatinine Avoid nephrotoxic medications  GASTROINTESTINAL A:  Mild abdominal pain-right upper quadrant and epigastrium P:   Continue PPI for GI prophylaxis Maalox when necessary for heartburn  HEMATOLOGIC A:   Varicose vein ablation P:  Follow-up with vascular surgery  INFECTIOUS A:   Community-acquired pneumonia Leukocytosis-WBC trending down P:     Micro/culture results:  BCx2 pending.  UC pending.  Sputum -- MRSA screen pending.   Antibiotics: Ceftriaxone 12/8 Azithromycin 12/8  ENDOCRINE A:  Monitor glucose.  --Rheumatoid arthritis on remicaide.  P:   SSI Empiric steroids.   NEUROLOGIC A:  No acute issues P:   RASS goal: N/A Continue to monitor mental status   MAJOR EVENTS/TEST RESULTS:   Best Practices  DVT Prophylaxis: enoxaparin GI Prophylaxis: --PPI  Patient is medically stable for transfer out of the unit. Report given to Dr. Estanislado Pandy at 2:20 AM   ---------------------------------------  ---------------------------------------   Name: Kaitlyn Huff MRN: [id number] DOB: 02-22-1941    ADMISSION DATE:  06/16/2016 CONSULTATION DATE:  06/17/16  REFERRING MD :  Dr. Estanislado Pandy  CHIEF COMPLAINT:  Hypotension.    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  She had undergone similar on the other leg approximately one month before.  Soon after going home she started to feel ill with nausea, vomiting, cough, tremors/rigors and diminished mental status.    She was initially admitted by Dr. Posey Pronto to the general medical floor. However due to persistent hypotension, ICU service was consulted by ED for admission to the ICU.  The patient is currently awake, alert, she does not remember much from the past 12 hours. She notes that she is feeling better than she was. She denies chest pain, orthopnea. dyspnea. She has no history of blood clots in the past. However he BP remains low and is currently on levophed drip.  She has no other complaints at this time.   Subjective: No acute issues overnight. On episodes of hypoxemia when sleeping, placed on supplemental oxygen. Overall patient is much improved  REVIEW OF SYSTEMS:   Constitutional: Feels well. Cardiovascular: No chest pain.  Pulmonary: Denies dyspnea.   The remainder of systems were reviewed and were found to be negative other than what is documented in the HPI.    VITAL SIGNS: Temp:  [97.8 F (36.6 C)-99 F (37.2 C)] 97.8 F (36.6 C) (12/09 0300) Pulse Rate:  [36-74] 58 (12/09 0600) Resp:  [10-28] 20 (12/09 0600) BP: (75-135)/(34-82) 107/59 (12/09 0600) SpO2:  [89 %-98 %] 97 % (12/09 0600) Weight:  [82 kg (180 lb 12.4 oz)] 82 kg (180 lb 12.4 oz) (12/08 1014) HEMODYNAMICS:   VENTILATOR SETTINGS:   INTAKE / OUTPUT:  Intake/Output Summary (Last 24 hours) at 06/18/16 0617 Last data filed  at 06/18/16 0600  Gross per 24 hour  Intake             2315 ml  Output             1150 ml  Net             1165 ml    Physical Examination:   VS: BP (!) 107/59   Pulse (!) 58   Temp 97.8 F (36.6 C) (Oral)   Resp 20   Ht 5\' 4"  (1.626 m)   Wt 82 kg (180 lb 12.4 oz)    SpO2 97%   BMI 31.03 kg/m   General Appearance: No distress  Neuro:without focal findings, mental status normal.  HEENT: PERRLA, EOM intact, no ptosis, no other lesions noticed;  Pulmonary: normal breath sounds., diaphragmatic excursion normal. CardiovascularNormal S1,S2.  No m/r/g.    Abdomen: Benign, Soft, non-tender, No masses, hepatosplenomegaly, No lymphadenopathy Renal:  No costovertebral tenderness  GU:  Not performed at this time. Endoc: No evident thyromegaly, no signs of acromegaly. Skin:   warm, no rashes, no ecchymosis  Extremities: normal, no cyanosis, bilateral 2+ LE edema.    LABS: Reviewed   LABORATORY PANEL:   CBC  Recent Labs Lab 06/18/16 0456  WBC 12.2*  HGB 9.7*  HCT 27.8*  PLT 140*    Chemistries   Recent Labs Lab 06/16/16 2333  06/18/16 0456  NA 136  < > 139  K 4.1  < > 4.0  CL 98*  < > 109  CO2 28  < > 27  GLUCOSE 120*  < > 113*  BUN 14  < > 17  CREATININE 1.24*  < > 0.95  CALCIUM 8.9  < > 8.5*  MG  --   --  1.8  PHOS  --   --  3.6  AST 44*  --   --   ALT 31  --   --   ALKPHOS 43  --   --   BILITOT 0.8  --   --   < > = values in this interval not displayed.   Recent Labs Lab 06/17/16 1014 06/17/16 1642 06/17/16 2207  GLUCAP 124* 141* 105*   No results for input(s): PHART, PCO2ART, PO2ART in the last 168 hours.  Recent Labs Lab 06/16/16 2333  AST 44*  ALT 31  ALKPHOS 43  BILITOT 0.8  ALBUMIN 4.2    Cardiac Enzymes  Recent Labs Lab 06/17/16 1030  TROPONINI 0.04*    RADIOLOGY:  Dg Chest Port 1 View  Result Date: 06/17/2016 CLINICAL DATA:  Dyspnea and weakness today EXAM: PORTABLE CHEST 1 VIEW COMPARISON:  03/18/2016 FINDINGS: Slight patchy opacity in the medial bases may relate to a shallow degree of inspiration. Cannot exclude early infectious infiltrate. Lungs are otherwise clear. Pulmonary vasculature is normal. No pleural effusions. Unchanged mild cardiomegaly IMPRESSION: Mild patchy basilar opacities;  cannot exclude early infectious infiltrate. Electronically Signed   By: Andreas Newport M.D.   On: 06/17/2016 00:28    [facility] ANP-BC Pulmonary and Critical Care Medicine [facility] Pager [phone] or [phone]  06/18/2016, 6:17 AM

## 2016-06-18 NOTE — Progress Notes (Signed)
Patient ID: Kaitlyn Huff, female   DOB: March 06, 1941, 75 y.o.   MRN: [id number]  [employer] PROGRESS NOTE  Kaitlyn Huff [id number] DOB: 1941/04/22 DOA: 06/16/2016 PCP: [name], MD  HPI/Subjective: Patient with nauseousness and vomiting. Not feeling well. Some shortness of breath and cough.  Objective: Vitals:   06/18/16 0923 06/18/16 1345  BP: 124/75 111/71  Pulse: 95 81  Resp: 18 18  Temp: 97.6 F (36.4 C) 98.1 F (36.7 C)    Filed Weights   06/16/16 2336 06/17/16 1014  Weight: 77.1 kg (170 lb) 82 kg (180 lb 12.4 oz)    ROS: Review of Systems  Constitutional: Negative for chills and fever.  Eyes: Negative for blurred vision.  Respiratory: Positive for cough and shortness of breath.   Cardiovascular: Negative for chest pain.  Gastrointestinal: Positive for diarrhea, nausea and vomiting. Negative for abdominal pain and constipation.  Genitourinary: Negative for dysuria.  Musculoskeletal: Negative for joint pain.  Neurological: Negative for dizziness and headaches.   Exam: Physical Exam  Constitutional: She is oriented to person, place, and time.  HENT:  Nose: No mucosal edema.  Mouth/Throat: No oropharyngeal exudate or posterior oropharyngeal edema.  Eyes: Conjunctivae, EOM and lids are normal. Pupils are equal, round, and reactive to light.  Neck: No JVD present. Carotid bruit is not present. No edema present. No thyroid mass and no thyromegaly present.  Cardiovascular: S1 normal and S2 normal.  An irregular rhythm present. Exam reveals no gallop.   Murmur heard.  Systolic murmur is present with a grade of 2/6  Pulses:      Dorsalis pedis pulses are 2+ on the right side, and 2+ on the left side.  Respiratory: No respiratory distress. She has decreased breath sounds in the right lower field and the left lower field. She has no wheezes. She has no rhonchi. She has no rales.  GI: Soft. Bowel sounds are normal. There is no tenderness.   Musculoskeletal:       Right ankle: She exhibits swelling.       Left ankle: She exhibits swelling.  Lymphadenopathy:    She has no cervical adenopathy.  Neurological: She is alert and oriented to person, place, and time. No cranial nerve deficit.  Skin: Skin is warm. No rash noted. Nails show no clubbing.  Psychiatric: She has a normal mood and affect.      Data Reviewed: Basic Metabolic Panel:  Recent Labs Lab 06/16/16 2333 06/17/16 0607 06/18/16 0456  NA 136 135 139  K 4.1 3.8 4.0  CL 98* 103 109  CO2 28 25 27   GLUCOSE 120* 97 113*  BUN 14 15 17   CREATININE 1.24* 1.29* 0.95  CALCIUM 8.9 7.4* 8.5*  MG  --   --  1.8  PHOS  --   --  3.6   Liver Function Tests:  Recent Labs Lab 06/16/16 2333  AST 44*  ALT 31  ALKPHOS 43  BILITOT 0.8  PROT 7.9  ALBUMIN 4.2    Recent Labs Lab 06/16/16 2333  LIPASE 69*   CBC:  Recent Labs Lab 06/16/16 2333 06/17/16 0607 06/18/16 0456  WBC 15.0* 20.2* 12.2*  NEUTROABS 12.5*  --   --   HGB 12.8 10.5* 9.7*  HCT 37.4 30.8* 27.8*  MCV 92.3 92.1 93.0  PLT 204 179 140*   Cardiac Enzymes:  Recent Labs Lab 06/16/16 2333 06/17/16 0246 06/17/16 1030  TROPONINI 0.04* 0.05* 0.04*    CBG:  Recent Labs Lab 06/17/16 1014  06/17/16 1642 06/17/16 2207 06/18/16 0732  GLUCAP 124* 141* 105* 129*    Recent Results (from the past 240 hour(s))  Blood Culture (routine x 2)     Status: None (Preliminary result)   Collection Time: 06/16/16 11:50 PM  Result Value Ref Range Status   Specimen Description BLOOD  RIGHT AC  Final   Special Requests   Final    BOTTLES DRAWN AEROBIC AND ANAEROBIC  ANA 15 ML AER 13ML   Culture NO GROWTH 2 DAYS  Final   Report Status PENDING  Incomplete  Blood Culture (routine x 2)     Status: None (Preliminary result)   Collection Time: 06/17/16 12:19 AM  Result Value Ref Range Status   Specimen Description BLOOD LEFT ARM  Final   Special Requests BOTTLES DRAWN AEROBIC AND ANAEROBIC AER ANA  10ML  Final   Culture NO GROWTH 1 DAY  Final   Report Status PENDING  Incomplete  Urine culture     Status: Abnormal   Collection Time: 06/17/16  1:28 AM  Result Value Ref Range Status   Specimen Description URINE, RANDOM  Final   Special Requests NONE  Final   Culture (A)  Final    <10,000 COLONIES/mL INSIGNIFICANT GROWTH Performed at [facility]    Report Status 06/18/2016 FINAL  Final  MRSA PCR Screening     Status: None   Collection Time: 06/17/16 10:18 AM  Result Value Ref Range Status   MRSA by PCR NEGATIVE NEGATIVE Final    Comment:        The GeneXpert MRSA Assay (FDA approved for NASAL specimens only), is one component of a comprehensive MRSA colonization surveillance program. It is not intended to diagnose MRSA infection nor to guide or monitor treatment for MRSA infections.      Studies: Dg Chest Port 1 View  Result Date: 06/17/2016 CLINICAL DATA:  Dyspnea and weakness today EXAM: PORTABLE CHEST 1 VIEW COMPARISON:  03/18/2016 FINDINGS: Slight patchy opacity in the medial bases may relate to a shallow degree of inspiration. Cannot exclude early infectious infiltrate. Lungs are otherwise clear. Pulmonary vasculature is normal. No pleural effusions. Unchanged mild cardiomegaly IMPRESSION: Mild patchy basilar opacities; cannot exclude early infectious infiltrate. Electronically Signed   By: Andreas Newport M.D.   On: 06/17/2016 00:28    Scheduled Meds: . acidophilus  1 capsule Oral Daily  . aspirin EC  81 mg Oral Daily  . [START ON 06/19/2016] azithromycin  500 mg Oral Daily  . cefTRIAXone (ROCEPHIN)  IV  1 g Intravenous Q24H  . cholecalciferol  1,000 Units Oral Daily  . enoxaparin (LOVENOX) injection  1 mg/kg Subcutaneous Q12H  . fluticasone  2 spray Each Nare Daily  . gabapentin  300 mg Oral TID WC & HS  . [START ON 06/19/2016] hydrocortisone sod succinate (SOLU-CORTEF) inj  50 mg Intravenous Daily  . hydroxychloroquine  200 mg Oral BID  . Influenza  vac split quadrivalent PF  0.5 mL Intramuscular Tomorrow-1000  . loratadine  10 mg Oral Daily  . multivitamin with minerals  1 tablet Oral Daily  . pantoprazole  40 mg Oral Daily  . pramipexole  0.5 mg Oral BID  . simvastatin  20 mg Oral Daily   Continuous Infusions: . sodium chloride 75 mL/hr at 06/18/16 0600    Assessment/Plan:  1. Clinical sepsis with pneumonia, hypotension, fever and leukocytosis. On Rocephin and Zithromax. Patient off pressors since yesterday. Taper down stress dose steroids. 2. Bilateral basilar pneumonia. On  Rocephin and Zithromax 3. Nausea and vomiting. Try dose of Phenergan and see if that helps. Zofran 4. Weakness. Physical therapy evaluation 5. Rheumatoid arthritis on Plaquenil 6. GERD on Protonix 7. Hyperlipidemia unspecified on simvastatin 8. Restless leg syndrome on Mirapex  Code Status:     Code Status Orders        Start     Ordered   06/17/16 0200  Full code  Continuous     06/17/16 0200    Code Status History    Date Active Date Inactive Code Status Order ID Comments User Context   08/18/2015  4:28 PM 08/22/2015  4:15 PM Full Code [id number]  Lytle Butte, MD ED   08/03/2015  9:49 AM 08/07/2015  2:11 PM Full Code [id number]  Saundra Shelling, MD Inpatient     Family Communication: Family at the bedside Disposition Plan: Home likely in a few days  Consultants:  Critical care specialist signed off  Antibiotics:  Rocephin  Zithromax  Time spent: 28 minutes  [place], [place]

## 2016-06-18 NOTE — Progress Notes (Signed)
PHARMACIST - PHYSICIAN COMMUNICATION DR:   Leslye Peer CONCERNING: Antibiotic IV to Oral Route Change Policy  RECOMMENDATION: This patient is receiving azithromycin by the intravenous route.  Based on criteria approved by the Pharmacy and Therapeutics Committee, the antibiotic(s) is/are being converted to the equivalent oral dose form(s).   DESCRIPTION: These criteria include:  Patient being treated for a respiratory tract infection, urinary tract infection, cellulitis or clostridium difficile associated diarrhea if on metronidazole  The patient is not neutropenic and does not exhibit a GI malabsorption state  The patient is eating (either orally or via tube) and/or has been taking other orally administered medications for a least 24 hours  The patient is improving clinically and has a Tmax < 100.5  If you have questions about this conversion, please contact the [place], PharmD 06/18/16 1:36 PM

## 2016-06-18 NOTE — Progress Notes (Signed)
Report called to RN Abby on (1A) Kaitlyn Huff daughter notified of transfer to room 154.

## 2016-06-18 NOTE — Progress Notes (Signed)
Patient's blood pressure is stable off pressors. OK to transfer out of the unit. Spoke with Dr. Estanislado Pandy. Hospitalist will resume care starting 06/18/16. Transfer orders placed.

## 2016-06-18 NOTE — Progress Notes (Signed)
Call from [place] notified this nurse that patient went into A.Fib HR 120's-130's.  Went into Patient's room, patient was sleeping and felt "fine". Notified Dr. Posey Pronto. Gave order for a one time dose of Cardizem IV 10mg . Notified HS, CCMD of situation. Administrated med. Patient went into ST HR 110's, Dr. Posey Pronto notified. No further orders at this time.

## 2016-06-19 ENCOUNTER — Encounter: Payer: Self-pay | Admitting: Radiology

## 2016-06-19 ENCOUNTER — Inpatient Hospital Stay: Payer: Medicare Other

## 2016-06-19 ENCOUNTER — Inpatient Hospital Stay
Admit: 2016-06-19 | Discharge: 2016-06-19 | Disposition: A | Discharge status: Home / Self Care | Payer: Medicare Other | Attending: Cardiology | Admitting: Cardiology

## 2016-06-19 LAB — CBC
HCT: 27.1 % — ABNORMAL LOW (ref 35.0–47.0)
Hemoglobin: 9.4 g/dL — ABNORMAL LOW (ref 12.0–16.0)
MCH: 32.3 pg (ref 26.0–34.0)
MCHC: 34.7 g/dL (ref 32.0–36.0)
MCV: 93 fL (ref 80.0–100.0)
Platelets: 154 K/uL (ref 150–440)
RBC: 2.91 MIL/uL — ABNORMAL LOW (ref 3.80–5.20)
RDW: 12.9 % (ref 11.5–14.5)
WBC: 8.9 K/uL (ref 3.6–11.0)

## 2016-06-19 LAB — COMPREHENSIVE METABOLIC PANEL WITH GFR
ALT: 22 U/L (ref 14–54)
AST: 29 U/L (ref 15–41)
Albumin: 3.3 g/dL — ABNORMAL LOW (ref 3.5–5.0)
Alkaline Phosphatase: 31 U/L — ABNORMAL LOW (ref 38–126)
Anion gap: 4 — ABNORMAL LOW (ref 5–15)
BUN: 14 mg/dL (ref 6–20)
CO2: 25 mmol/L (ref 22–32)
Calcium: 8.2 mg/dL — ABNORMAL LOW (ref 8.9–10.3)
Chloride: 107 mmol/L (ref 101–111)
Creatinine, Ser: 0.86 mg/dL (ref 0.44–1.00)
GFR calc Af Amer: >60 mL/min
GFR calc non Af Amer: >60 mL/min
Glucose, Bld: 87 mg/dL (ref 65–99)
Potassium: 3.4 mmol/L — ABNORMAL LOW (ref 3.5–5.1)
Sodium: 136 mmol/L (ref 135–145)
Total Bilirubin: 0.8 mg/dL (ref 0.3–1.2)
Total Protein: 6.2 g/dL — ABNORMAL LOW (ref 6.5–8.1)

## 2016-06-19 LAB — LIPASE, BLOOD: Lipase: 39 U/L (ref 11–51)

## 2016-06-19 LAB — ECHOCARDIOGRAM COMPLETE
Height: 64 in
WEIGHTICAEL: 2892.44 [oz_av]

## 2016-06-19 MED ORDER — NYSTATIN 100000 UNIT/ML MT SUSP
5.0000 mL | Freq: Four times a day (QID) | OROMUCOSAL | Status: DC
  Administered 2016-06-19 – 2016-06-21 (×8): 500000 [IU] via ORAL
  Filled 2016-06-19 (×9): qty 5

## 2016-06-19 MED ORDER — IOPAMIDOL (ISOVUE-300) INJECTION 61%
100.0000 mL | Freq: Once | INTRAVENOUS | Status: AC | PRN
  Administered 2016-06-19: 100 mL via INTRAVENOUS

## 2016-06-19 MED ORDER — MECLIZINE HCL 25 MG PO TABS
25.0000 mg | ORAL_TABLET | Freq: Three times a day (TID) | ORAL | Status: DC
  Administered 2016-06-19 – 2016-06-20 (×4): 25 mg via ORAL
  Filled 2016-06-19 (×6): qty 1

## 2016-06-19 MED ORDER — IOPAMIDOL (ISOVUE-370) INJECTION 76%: 100.0000 mL | Freq: Once | INTRAVENOUS | Status: DC | PRN

## 2016-06-19 MED ORDER — DILTIAZEM HCL ER COATED BEADS 180 MG PO CP24
180.0000 mg | ORAL_CAPSULE | Freq: Every day | ORAL | Status: DC
  Administered 2016-06-19 – 2016-06-20 (×2): 180 mg via ORAL
  Filled 2016-06-19 (×2): qty 1

## 2016-06-19 MED ORDER — DIGOXIN 0.25 MG/ML IJ SOLN
0.2500 mg | Freq: Once | INTRAMUSCULAR | Status: AC
  Administered 2016-06-19: 0.25 mg via INTRAVENOUS
  Filled 2016-06-19: qty 1

## 2016-06-19 MED ORDER — PROMETHAZINE HCL 25 MG/ML IJ SOLN: 12.5000 mg | Freq: Four times a day (QID) | INTRAMUSCULAR | Status: DC | PRN

## 2016-06-19 MED ORDER — IOPAMIDOL (ISOVUE-300) INJECTION 61%
15.0000 mL | INTRAVENOUS | Status: AC
  Administered 2016-06-19 (×2): 15 mL via ORAL

## 2016-06-19 MED ORDER — ONDANSETRON HCL 4 MG/2ML IJ SOLN
4.0000 mg | Freq: Four times a day (QID) | INTRAMUSCULAR | Status: DC
  Filled 2016-06-19: qty 2

## 2016-06-19 MED ORDER — HYDROCORTISONE NA SUCCINATE PF 100 MG IJ SOLR: 25.0000 mg | Freq: Every day | INTRAMUSCULAR | Status: DC

## 2016-06-19 MED ORDER — IOPAMIDOL (ISOVUE-300) INJECTION 61%: 30.0000 mL | Freq: Once | INTRAVENOUS | Status: DC | PRN

## 2016-06-19 MED ORDER — MAGNESIUM SULFATE 2 GM/50ML IV SOLN
2.0000 g | Freq: Once | INTRAVENOUS | Status: AC
  Administered 2016-06-19: 2 g via INTRAVENOUS
  Filled 2016-06-19: qty 50

## 2016-06-19 NOTE — Progress Notes (Signed)
Patient has had 3 watery (not seedy) bowel movements this afternoon per [facility] NT. Per patient this is not uncommon for her to have at home. No need to test for c.diff at this time per Dr. Posey Pronto. Will continue to monitor.

## 2016-06-19 NOTE — Progress Notes (Signed)
Rapid Response called to room 154. Patient complaining of chest discomfort. Cecille Rubin, RN primary care nurse at bedside and reports patient's HR had dropped to 30's, and she started complaining of chest discomfort. Adam, [place] from ICU present and placed patient on monitor. Patient A-fib with RVR 110's-130's. Patient currently on telemetry and had been in A-Fib since yesterday. Respiratory Therapist at bedside. 12 Lead EKG done immediately confirming A-Fib. Patient continued to c/o chest discomfort. Dr. Leslye Peer made aware and gave order to transfer patient to the Telemetry unit for medication management and closer monitoring. Patient remained A&O X 3. Family present. Lori preparing to call report to RN on 2A.

## 2016-06-19 NOTE — Progress Notes (Signed)
Patient transferred to room from 1A. No distress or complaints. Family at bedside. Skin checked with [place] RN. Tele box verified with [facility] NT. Will continue to monitor.

## 2016-06-19 NOTE — Progress Notes (Addendum)
Pt  Was in stable condition. No complaints of pain. Continued in afib. Gave one time dose of digoxin gave 90ml monitoring pt heart rate which was in the 120 went down to 31 back up to 80 and up to 130. continued to monitor heart rate, decreased back down to 30.Then she complained of shortness of breath. Family at bedside. Pt stated she felt her heart beat.  Notified rapid response for back up. One dose only given. transfer pt to 2A. Report given. Transferred to Room 246. 1st bottle of contrast given tolerated well.

## 2016-06-19 NOTE — Progress Notes (Signed)
Patient has rested quietly this afternoon, heart rate sustaining 90-110s, occassiunally jumping into 120's with activity. Asymptomatic. Spoke to Dr. Leslye Peer about this earlier, he was ok with this range for now, will continue to monitor. No complaints. Family at bedside.

## 2016-06-19 NOTE — Progress Notes (Signed)
PT Cancellation Note  Patient Details Name: Kaitlyn Huff MRN: [id number] DOB: 11-06-1940   Cancelled Treatment:    Reason Eval/Treat Not Completed: Patient not medically ready. Chart reviewed, RN consulted: Pt with new onset afib, HR ranging between 115-130s. RN issued meds earlier this morning, and HR still at this range. Not appropriate for PT evalaution/exersional activity at this time. Will attempts again at later date/time.    11:44 AM, 06/19/16 Etta Grandchild, PT, DPT Physical Therapist - [place] [phone] (mobile)

## 2016-06-19 NOTE — Progress Notes (Signed)
Patient ID: CELLESTINE SAMAN, female   DOB: 1941-01-17, 75 y.o.   MRN: [id number]  [facility] PROGRESS NOTE  LO PAAP [id number] DOB: [birth year] DOA: 06/16/2016 PCP: [name], MD  HPI/Subjective: Patient seen earlier this am and again after rapid response. Patient this morning was having a lot of nausea vomiting. She is also having abdominal pain. She is having room spinning and lightheadedness. No weakness one side versus the other. With the rapid response episode she felt palpitations and chest tightness. It started to settle down.  Objective: Vitals:   06/19/16 0731 06/19/16 1053  BP: 135/82 (!) 144/91  Pulse: (!) 129 (!) 132  Resp: 18 20  Temp: 98.1 F (36.7 C) 97.8 F (36.6 C)    Filed Weights   06/16/16 2336 06/17/16 1014  Weight: 77.1 kg (170 lb) 82 kg (180 lb 12.4 oz)    ROS: Review of Systems  Constitutional: Negative for chills and fever.  Eyes: Negative for blurred vision.  Respiratory: Positive for shortness of breath. Negative for cough.   Cardiovascular: Positive for chest pain and palpitations.  Gastrointestinal: Positive for abdominal pain, nausea and vomiting. Negative for constipation and diarrhea.  Genitourinary: Negative for dysuria.  Musculoskeletal: Negative for joint pain.  Neurological: Negative for dizziness and headaches.   Exam: Physical Exam  Constitutional: She is oriented to person, place, and time.  HENT:  Nose: No mucosal edema.  Mouth/Throat: No oropharyngeal exudate or posterior oropharyngeal edema.  Eyes: Conjunctivae, EOM and lids are normal. Pupils are equal, round, and reactive to light.  Neck: No JVD present. Carotid bruit is not present. No edema present. No thyroid mass and no thyromegaly present.  Cardiovascular: S1 normal and S2 normal.  An irregularly irregular rhythm present. Tachycardia present.  Exam reveals no gallop.   Murmur heard.  Systolic murmur is present with a grade of 2/6  Pulses:       Dorsalis pedis pulses are 2+ on the right side, and 2+ on the left side.  Respiratory: No respiratory distress. She has decreased breath sounds in the right lower field and the left lower field. She has no wheezes. She has no rhonchi. She has no rales.  GI: Soft. Bowel sounds are normal. There is tenderness in the epigastric area.  Musculoskeletal:       Right ankle: She exhibits swelling.       Left ankle: She exhibits swelling.  Lymphadenopathy:    She has no cervical adenopathy.  Neurological: She is alert and oriented to person, place, and time. No cranial nerve deficit.  Skin: Skin is warm. No rash noted. Nails show no clubbing.  Psychiatric: She has a normal mood and affect.      Data Reviewed: Basic Metabolic Panel:  Recent Labs Lab 06/16/16 2333 06/17/16 0607 06/18/16 0456 06/19/16 0417  NA 136 135 139 136  K 4.1 3.8 4.0 3.4*  CL 98* 103 109 107  CO2 28 25 27 25   GLUCOSE 120* 97 113* 87  BUN 14 15 17 14   CREATININE 1.24* 1.29* 0.95 0.86  CALCIUM 8.9 7.4* 8.5* 8.2*  MG  --   --  1.8  --   PHOS  --   --  3.6  --    Liver Function Tests:  Recent Labs Lab 06/16/16 2333 06/19/16 0417  AST 44* 29  ALT 31 22  ALKPHOS 43 31*  BILITOT 0.8 0.8  PROT 7.9 6.2*  ALBUMIN 4.2 3.3*    Recent Labs Lab 06/16/16 2333  06/19/16 0417  LIPASE 69* 39   CBC:  Recent Labs Lab 06/16/16 2333 06/17/16 0607 06/18/16 0456 06/19/16 0417  WBC 15.0* 20.2* 12.2* 8.9  NEUTROABS 12.5*  --   --   --   HGB 12.8 10.5* 9.7* 9.4*  HCT 37.4 30.8* 27.8* 27.1*  MCV 92.3 92.1 93.0 93.0  PLT 204 179 140* 154   Cardiac Enzymes:  Recent Labs Lab 06/16/16 2333 06/17/16 0246 06/17/16 1030  TROPONINI 0.04* 0.05* 0.04*    CBG:  Recent Labs Lab 06/17/16 1014 06/17/16 1642 06/17/16 2207 06/18/16 0732  GLUCAP 124* 141* 105* 129*    Recent Results (from the past 240 hour(s))  Blood Culture (routine x 2)     Status: None (Preliminary result)   Collection Time: 06/16/16  11:50 PM  Result Value Ref Range Status   Specimen Description BLOOD  RIGHT AC  Final   Special Requests   Final    BOTTLES DRAWN AEROBIC AND ANAEROBIC  ANA 15 ML AER 13ML   Culture NO GROWTH 3 DAYS  Final   Report Status PENDING  Incomplete  Blood Culture (routine x 2)     Status: None (Preliminary result)   Collection Time: 06/17/16 12:19 AM  Result Value Ref Range Status   Specimen Description BLOOD LEFT ARM  Final   Special Requests BOTTLES DRAWN AEROBIC AND ANAEROBIC AER ANA 10ML  Final   Culture NO GROWTH 2 DAYS  Final   Report Status PENDING  Incomplete  Urine culture     Status: Abnormal   Collection Time: 06/17/16  1:28 AM  Result Value Ref Range Status   Specimen Description URINE, RANDOM  Final   Special Requests NONE  Final   Culture (A)  Final    <10,000 COLONIES/mL INSIGNIFICANT GROWTH Performed at [facility]    Report Status 06/18/2016 FINAL  Final  MRSA PCR Screening     Status: None   Collection Time: 06/17/16 10:18 AM  Result Value Ref Range Status   MRSA by PCR NEGATIVE NEGATIVE Final    Comment:        The GeneXpert MRSA Assay (FDA approved for NASAL specimens only), is one component of a comprehensive MRSA colonization surveillance program. It is not intended to diagnose MRSA infection nor to guide or monitor treatment for MRSA infections.       Scheduled Meds: . acidophilus  1 capsule Oral Daily  . aspirin EC  81 mg Oral Daily  . azithromycin  500 mg Oral Daily  . cefTRIAXone (ROCEPHIN)  IV  1 g Intravenous Q24H  . cholecalciferol  1,000 Units Oral Daily  . diltiazem  180 mg Oral Daily  . enoxaparin (LOVENOX) injection  1 mg/kg Subcutaneous Q12H  . fluticasone  2 spray Each Nare Daily  . gabapentin  300 mg Oral TID WC & HS  . [START ON 06/20/2016] hydrocortisone sod succinate (SOLU-CORTEF) inj  25 mg Intravenous Daily  . hydroxychloroquine  200 mg Oral BID  . Influenza vac split quadrivalent PF  0.5 mL Intramuscular Tomorrow-1000   . iopamidol  15 mL Oral Q1 Hr x 2  . loratadine  10 mg Oral Daily  . meclizine  25 mg Oral TID  . multivitamin with minerals  1 tablet Oral Daily  . nystatin  5 mL Oral QID  . ondansetron (ZOFRAN) IV  4 mg Intravenous Q6H  . pantoprazole  40 mg Oral Daily  . pramipexole  0.5 mg Oral BID  . simvastatin  20  mg Oral Daily   Continuous Infusions: . sodium chloride 50 mL/hr at 06/18/16 1645    Assessment/Plan:  1. Persistent nausea vomiting and abdominal pain. I switched Zofran to standing dose around the clock. When necessary Phenergan. CT scan of the abdomen and pelvis for further evaluation with contrast. CT scan of the brain to rule out central causes. 2. Rapid atrial fibrillation. Cardizem given this morning. Patient on high-dose Lovenox for right now. IV digoxin pushed earlier. May need Cardizem drip if heart rate remains fast.  3. Clinical sepsis with pneumonia hypotension fever and leukocytosis. On Rocephin and Zithromax. Patient is off pressors. Stop stress dose steroids. 4. Bilateral pneumonia on Rocephin and Zithromax 5. GERD on Protonix. 6. Hyperlipidemia unspecified on simvastatin 7. Restless leg syndrome on Mirapex 8. History of rheumatoid arthritis with being on immunosuppressants. 9. Patient wishes to be a DO NOT RESUSCITATE  Code Status: Patient just made a DO NOT RESUSCITATE    Code Status Orders        Start     Ordered   06/17/16 0200  Full code  Continuous     06/17/16 0200    Code Status History    Date Active Date Inactive Code Status Order ID Comments User Context   08/18/2015  4:28 PM 08/22/2015  4:15 PM Full Code [id number]  Lytle Butte, MD ED   08/03/2015  9:49 AM 08/07/2015  2:11 PM Full Code [id number]  Saundra Shelling, MD Inpatient     Family Communication: Family at the bedside  Disposition Plan: To be determined  Consultants:  Cardiology  Antibiotics:  Rocephin  Zithromax  Time spent: 30 minutes  [place], [place]

## 2016-06-19 NOTE — Significant Event (Signed)
Rapid Response Event Note  Overview: Called for RR in 154.  PT here for New onset A-fib complaining of chest discomfort and sob.   Time Called: 1234 Arrival Time: 1237 Event Type: Cardiac  Initial Focused Assessment: Pt sitting up in bed appears a little anxious.  Placed on monitor, found to be in A-Fib with RVR.  113-130's.  Pt alert and oriented.  EKG obtained to confirm rhythm. Primary RN reports she was giving digoxin, and noticed HR fall to 31bpm.  Pt began to report discomfort.  Rapid was called.    Interventions:  EKG-A-fib confirmed.    Plan of Care (if not transferred): trasnfered to 2A for monitoring.  Dr. Earleen Newport called for further orders, giving orders to primary RN.   Event Summary: pt found to be in A-Fib, with RVR.  Will transfer patient to 2A for closer monitoring.  Primary RN comfortable with transporting patient, now that rate is stablized in the 108-118 bpm.  Cheryle RN AC at bedside, primary RN agrees to call for further issues, and will transport patient.   Name of Physician Notified: [place] at 1245    at    Outcome: Transferred (Comment)  Event End Time: [place]

## 2016-06-19 NOTE — Progress Notes (Signed)
Chaplain received a page to visit a pt in room 154. Provided guidance for an Advanced Directive.    06/19/16 1252  Clinical Encounter Type  Visited With Patient;Patient and family together  Visit Type Initial  Referral From Nurse  Consult/Referral To Chaplain  Spiritual Encounters  Spiritual Needs Other (Comment)

## 2016-06-19 NOTE — Consult Note (Signed)
[facility] Cardiology  CARDIOLOGY CONSULT NOTE  Patient ID: Kaitlyn Huff MRN: [id number] DOB/AGE: [birth year] 75 y.o.  Admit date: 06/16/2016 Referring Physician Leslye Peer Primary Physician [facility] Primary Cardiologist Fath Reason for Consultation Atrial fibrillation  HPI: 75 year old female referred for atrial fibrillation. The patient was admitted on 06/17/2016 with nausea, vomiting, shortness of breath and generalized weakness, diagnosed with community-acquired pneumonia and sepsis. The patient has had persistent nausea and vomiting with intermittent diarrhea. Today, the patient developed atrial fibrillation with a rapid ventricular rate, asymptomatic. The patient was started on Cardizem CD 180 mg daily and given 25 mg intravenous digoxin. She currently denies chest pain.  Review of systems complete and found to be negative unless listed above     Past Medical History:  Diagnosis Date  . Anemia   . Diverticulitis   . GERD (gastroesophageal reflux disease)   . Hyperlipidemia   . Hypertension   . Osteoarthritis   . Osteoporosis    actonel  . Positive PPD    s/p INH (2006)  . Rheumatoid arthritis(714.0)    MTX transaminitis, Leflunomide (rash), enbrel, plaquinil, prednisone, remicade, Imuran (transaminitis)  . Valvular heart disease    moderate MR and TR    Past Surgical History:  Procedure Laterality Date  . ABDOMINAL HYSTERECTOMY    . CERVICAL CONE BIOPSY    . CHOLECYSTECTOMY  06/22/14  . COLONOSCOPY WITH PROPOFOL N/A 07/09/2015   Procedure: COLONOSCOPY WITH PROPOFOL;  Surgeon: Manya Silvas, MD;  Location: [facility] ENDOSCOPY;  Service: Endoscopy;  Laterality: N/A;  . ESOPHAGOGASTRODUODENOSCOPY (EGD) WITH PROPOFOL N/A 07/09/2015   Procedure: ESOPHAGOGASTRODUODENOSCOPY (EGD) WITH PROPOFOL;  Surgeon: Manya Silvas, MD;  Location: [facility] ENDOSCOPY;  Service: Endoscopy;  Laterality: N/A;  . OVARY SURGERY    . TRACHEOSTOMY  1959  . TUBAL LIGATION      Prescriptions Prior to Admission   Medication Sig Dispense Refill Last Dose  . acidophilus (RISAQUAD) CAPS capsule Take 1 capsule by mouth daily.   unknown at unknown  . ADVAIR DISKUS 250-50 MCG/DOSE AEPB USE 1 INHALATION EVERY 12 HOURS 180 each 0 unknown at unknown  . albuterol (PROVENTIL) (2.5 MG/3ML) 0.083% nebulizer solution Take 2.5 mg by nebulization every 6 (six) hours as needed for wheezing or shortness of breath.   unknown at unknown  . alendronate (FOSAMAX) 70 MG tablet Take 70 mg by mouth once a week.    unknown at unknown  . ALPRAZolam (XANAX) 0.25 MG tablet Take 1 tablet (0.25 mg total) by mouth daily as needed for anxiety. 90 tablet 0 unknown at unknown  . aspirin EC 81 MG tablet Take 81 mg by mouth daily.   unknown at unknown  . Calcium Carbonate-Vitamin D (CALCIUM 600+D) 600-400 MG-UNIT tablet Take 1 tablet by mouth 2 (two) times daily.   unknown at unknown  . cetirizine (ZYRTEC) 10 MG tablet Take 1 tablet (10 mg total) by mouth daily. 90 tablet 1 unknown at unknown  . cholecalciferol (VITAMIN D) 1000 units tablet Take 1,000 Units by mouth daily.   unknown at unknown  . clonazePAM (KLONOPIN) 0.5 MG tablet Take 0.5 mg by mouth 2 (two) times daily as needed for anxiety.   unknown at unknown  . colestipol (COLESTID) 1 g tablet Take 2 g by mouth 2 (two) times daily.   unknown at unknown  . Diphenhyd-Hydrocort-Nystatin (FIRST-DUKES MOUTHWASH) SUSP Pt is to swish and spit 5 cc's tid 237 mL 0 prn at prn  . FeFum-FePo-FA-B Cmp-C-Zn-Mn-Cu (TANDEM PLUS) 162-115.2-1 MG CAPS Take 1  capsule by mouth daily. 90 each 0 unknown at unknown  . fluticasone (FLONASE) 50 MCG/ACT nasal spray Place 2 sprays into both nostrils daily. 16 g 6 unknown at unknown  . furosemide (LASIX) 20 MG tablet Take 1 tablet (20 mg total) by mouth daily. 90 tablet 0 unknown at unknown  . gabapentin (NEURONTIN) 300 MG capsule Take 300 mg by mouth 4 (four) times daily.   unknown at unknown  . guaiFENesin-dextromethorphan (ROBITUSSIN DM) 100-10 MG/5ML syrup  Take 5 mLs by mouth every 4 (four) hours as needed for cough. 118 mL 0 prn at prn  . hydroxychloroquine (PLAQUENIL) 200 MG tablet Take 1 tablet (200 mg total) by mouth 2 (two) times daily. (Patient taking differently: Take 200 mg by mouth daily. ) 180 tablet 1 unknown at unknown  . KLOR-CON SPRINKLE 10 MEQ CR capsule TAKE 2 CAPSULES TWICE A DAY 360 capsule 3 unknown at unknown  . lansoprazole (PREVACID) 30 MG capsule Take 30 mg by mouth 2 (two) times daily before a meal.   unknown at unknown  . meloxicam (MOBIC) 15 MG tablet Take 15 mg by mouth daily as needed for pain.   prn at prn  . Multiple Vitamin (MULTIVITAMIN WITH MINERALS) TABS tablet Take 1 tablet by mouth daily.   unknown at unknown  . nystatin cream (MYCOSTATIN) APPLY 1 APPLICATION TOPICALLY TO THE AFFECTED AREA (OUTER VAGINA) TWICE A DAY   unknown at unknown  . pramipexole (MIRAPEX) 0.25 MG tablet TAKE 2 TABLETS TWICE A DAY 180 tablet 2 unknown at unknown  . predniSONE (DELTASONE) 5 MG tablet Take 5 mg by mouth daily.    unknown at unknown  . pregabalin (LYRICA) 25 MG capsule Take 25-50 mg by mouth daily.   unknown at unknown  . PROAIR HFA 108 (90 Base) MCG/ACT inhaler USE 2 INHALATIONS EVERY 4 HOURS AS NEEDED FOR WHEEZING OR SHORTNESS OF BREATH 25.5 g 0 prn at prn  . RABEprazole (ACIPHEX) 20 MG tablet Take 1 tablet (20 mg total) by mouth 2 (two) times daily before a meal. 60 tablet 1 unknown at unknown  . simvastatin (ZOCOR) 20 MG tablet Take 20 mg by mouth daily.    unknown at unknown  . sodium chloride (OCEAN) 0.65 % nasal spray Place 1 spray into the nose as needed for congestion.   prn at prn  . tiotropium (SPIRIVA) 18 MCG inhalation capsule Place 1 capsule (18 mcg total) into inhaler and inhale daily. 90 capsule 0 unknown at unknown  . TOPROL XL 50 MG 24 hr tablet TAKE 1 TABLET DAILY. TAKE WITH OR IMMEDIATELY FOLLOWING A MEAL 90 tablet 1 unknown at unknown  . traMADol (ULTRAM) 50 MG tablet Take 50 mg by mouth 2 (two) times daily as  needed for moderate pain.   unknown at unknown   Social History   Social History  . Marital status: Widowed    Spouse name: N/A  . Number of children: 4  . Years of education: N/A   Occupational History  . retired    Social History Main Topics  . Smoking status: Never Smoker  . Smokeless tobacco: Never Used  . Alcohol use No  . Drug use: No  . Sexual activity: No   Other Topics Concern  . Not on file   Social History Narrative   Lives with family at home    Family History  Problem Relation Age of Onset  . Heart disease Father     MI  . Heart disease Mother   .  Valvular heart disease Mother   . Breast cancer Sister 80  . Cancer Sister     Lung cancer  . Colon cancer Neg Hx       Review of systems complete and found to be negative unless listed above      PHYSICAL EXAM  General: Well developed, well nourished, in no acute distress HEENT:  Normocephalic and atramatic Neck:  No JVD.  Lungs: Clear bilaterally to auscultation and percussion. Heart: HRRR . Normal S1 and S2 without gallops or murmurs.  Abdomen: Bowel sounds are positive, abdomen soft and non-tender  Msk:  Back normal, normal gait. Normal strength and tone for age. Extremities: No clubbing, cyanosis or edema.   Neuro: Alert and oriented X 3. Psych:  Good affect, responds appropriately  Labs:   Lab Results  Component Value Date   WBC 8.9 06/19/2016   HGB 9.4 (L) 06/19/2016   HCT 27.1 (L) 06/19/2016   MCV 93.0 06/19/2016   PLT 154 06/19/2016    Recent Labs Lab 06/19/16 0417  NA 136  K 3.4*  CL 107  CO2 25  BUN 14  CREATININE 0.86  CALCIUM 8.2*  PROT 6.2*  BILITOT 0.8  ALKPHOS 31*  ALT 22  AST 29  GLUCOSE 87   Lab Results  Component Value Date   CKTOTAL 138 11/01/2014   CKMB 2.9 11/01/2014   TROPONINI 0.04 (HH) 06/17/2016    Lab Results  Component Value Date   CHOL 92 08/04/2015   CHOL 128 04/13/2015   CHOL 133 07/28/2014   Lab Results  Component Value Date   HDL  51 08/04/2015   HDL 58.30 04/13/2015   HDL 56.20 07/28/2014   Lab Results  Component Value Date   LDLCALC 30 08/04/2015   LDLCALC 46 04/13/2015   LDLCALC 51 07/28/2014   Lab Results  Component Value Date   TRIG 53 08/04/2015   TRIG 119.0 04/13/2015   TRIG 130.0 07/28/2014   Lab Results  Component Value Date   CHOLHDL 1.8 08/04/2015   CHOLHDL 2 04/13/2015   CHOLHDL 2 07/28/2014   Lab Results  Component Value Date   LDLDIRECT 73.7 08/20/2013      Radiology: Dg Chest Port 1 View  Result Date: 06/17/2016 CLINICAL DATA:  Dyspnea and weakness today EXAM: PORTABLE CHEST 1 VIEW COMPARISON:  03/18/2016 FINDINGS: Slight patchy opacity in the medial bases may relate to a shallow degree of inspiration. Cannot exclude early infectious infiltrate. Lungs are otherwise clear. Pulmonary vasculature is normal. No pleural effusions. Unchanged mild cardiomegaly IMPRESSION: Mild patchy basilar opacities; cannot exclude early infectious infiltrate. Electronically Signed   By: Andreas Newport M.D.   On: 06/17/2016 00:28    EKG: Atrial fibrillation  ASSESSMENT AND PLAN:   1. Atrial fibrillation with a rapid ventricular rate, likely secondary to underlying sepsis/pneumonia, persistent nausea and vomiting with diarrhea 2. Sepsis/acquired pneumonia 3. Nausea/vomiting/diarrhea of unknown etiology  Recommendations  1. Continue current medications 2. If patient has persistent tachycardia, consider additional loading dose of IV digoxin. Patient refractory to current medications, may consider Cardizem bolus and drip for control of tachycardia 3. Defer chronic anticoagulation at this time, since atrial fibrillation likely due to reversible cause 4. 2-D echocardiogram  Signed: Isaias Cowman MD,PhD, [facility] 06/19/2016, 10:49 AM

## 2016-06-20 ENCOUNTER — Encounter (INDEPENDENT_AMBULATORY_CARE_PROVIDER_SITE_OTHER): Payer: Medicare Other

## 2016-06-20 ENCOUNTER — Inpatient Hospital Stay: Payer: Medicare Other

## 2016-06-20 LAB — GASTROINTESTINAL PANEL BY PCR, STOOL (REPLACES STOOL CULTURE)
ADENOVIRUS F40/41: NOT DETECTED
Astrovirus: NOT DETECTED
CRYPTOSPORIDIUM: NOT DETECTED
CYCLOSPORA CAYETANENSIS: NOT DETECTED
Campylobacter species: NOT DETECTED
ENTEROAGGREGATIVE E COLI (EAEC): NOT DETECTED
ENTEROPATHOGENIC E COLI (EPEC): NOT DETECTED
Entamoeba histolytica: NOT DETECTED
Enterotoxigenic E coli (ETEC): NOT DETECTED
GIARDIA LAMBLIA: NOT DETECTED
Norovirus GI/GII: NOT DETECTED
Plesimonas shigelloides: NOT DETECTED
Rotavirus A: NOT DETECTED
Salmonella species: NOT DETECTED
Sapovirus (I, II, IV, and V): NOT DETECTED
Shiga like toxin producing E coli (STEC): NOT DETECTED
Shigella/Enteroinvasive E coli (EIEC): NOT DETECTED
VIBRIO SPECIES: NOT DETECTED
Vibrio cholerae: NOT DETECTED
YERSINIA ENTEROCOLITICA: NOT DETECTED

## 2016-06-20 LAB — C DIFFICILE QUICK SCREEN W PCR REFLEX
C Diff antigen: NEGATIVE
C Diff interpretation: NOT DETECTED
C Diff toxin: NEGATIVE

## 2016-06-20 MED ORDER — PREDNISONE 20 MG PO TABS
40.0000 mg | ORAL_TABLET | Freq: Every day | ORAL | Status: DC
  Administered 2016-06-20: 40 mg via ORAL
  Filled 2016-06-20: qty 2

## 2016-06-20 MED ORDER — METOPROLOL TARTRATE 25 MG PO TABS
25.0000 mg | ORAL_TABLET | Freq: Three times a day (TID) | ORAL | Status: DC
  Administered 2016-06-20 (×2): 25 mg via ORAL
  Filled 2016-06-20 (×2): qty 1

## 2016-06-20 MED ORDER — ONDANSETRON HCL 4 MG/2ML IJ SOLN
4.0000 mg | Freq: Four times a day (QID) | INTRAMUSCULAR | Status: DC | PRN
  Administered 2016-06-20: 4 mg via INTRAVENOUS
  Filled 2016-06-20: qty 2

## 2016-06-20 MED ORDER — METOPROLOL SUCCINATE ER 25 MG PO TB24
25.0000 mg | ORAL_TABLET | Freq: Every day | ORAL | Status: DC
  Administered 2016-06-20: 25 mg via ORAL
  Filled 2016-06-20: qty 1

## 2016-06-20 MED ORDER — BLISTEX MEDICATED EX OINT
TOPICAL_OINTMENT | CUTANEOUS | Status: DC | PRN
  Filled 2016-06-20: qty 6.3

## 2016-06-20 MED ORDER — VALACYCLOVIR HCL 500 MG PO TABS
1000.0000 mg | ORAL_TABLET | Freq: Two times a day (BID) | ORAL | Status: AC
  Administered 2016-06-20 (×2): 1000 mg via ORAL
  Filled 2016-06-20 (×2): qty 2

## 2016-06-20 MED ORDER — PENCICLOVIR 1 % EX CREA: TOPICAL_CREAM | Freq: Two times a day (BID) | CUTANEOUS | Status: DC

## 2016-06-20 MED ORDER — SIMVASTATIN 20 MG PO TABS: 20.0000 mg | ORAL_TABLET | Freq: Every day | ORAL | Status: DC

## 2016-06-20 NOTE — Care Management Important Message (Signed)
Important Message  Patient Details  Name: Kaitlyn Huff MRN: [id number] Date of Birth: 1941-05-11   Medicare Important Message Given:  Yes    Tommy Minichiello A, RN 06/20/2016, 7:55 AM

## 2016-06-20 NOTE — Progress Notes (Addendum)
Pt c/o being nauseated despite antiemetics given by morning shift RN. Pt and daughter also requested if Xanax can be given tonight. Pt has an order for Xanax 0.25mg  once a day PRN and was given a dose this afternoon at 1332. Oncall MD Hugelmyer paged and made aware, ordered for one time dose of Phenergan 12.5mg  IV and one time dose of Xanax 0.25mg  PO. Will administer as ordered and continue to monitor.

## 2016-06-20 NOTE — Progress Notes (Signed)
PT Cancellation Note  Patient Details Name: Kaitlyn Huff MRN: [id number] DOB: Dec 04, 1940   Cancelled Treatment:    Reason Eval/Treat Not Completed: Patient at procedure or test/unavailable (for MRI brain).  Will continue to follow acutely and will attempt to see pt again later today, schedule permitting.   Collie Siad PT, DPT 06/20/2016, 2:20 PM

## 2016-06-20 NOTE — Progress Notes (Signed)
Patient ID: Kaitlyn Huff, female   DOB: Feb 17, 1941, 75 y.o.   MRN: [id number]  [facility] PROGRESS NOTE  Kaitlyn Huff [id number] DOB: 04/19/1941 DOA: 06/16/2016 PCP: [name], MD  HPI/Subjective: Patient with lip swelling and cold sores on the lips. Nausea still present but not as bad. Still with some shortness of breath.  Objective: Vitals:   06/20/16 0500 06/20/16 1141  BP: (!) 141/64 (!) 143/72  Pulse: 99 87  Resp: 15 18  Temp: 98.7 F (37.1 C) 98.5 F (36.9 C)    Filed Weights   06/16/16 2336 06/17/16 1014  Weight: 77.1 kg (170 lb) 82 kg (180 lb 12.4 oz)    ROS: Review of Systems  Constitutional: Negative for chills and fever.  Eyes: Negative for blurred vision.  Respiratory: Positive for shortness of breath. Negative for cough.   Cardiovascular: Positive for chest pain.  Gastrointestinal: Positive for nausea. Negative for abdominal pain, constipation, diarrhea and vomiting.  Genitourinary: Negative for dysuria.  Musculoskeletal: Negative for joint pain.  Neurological: Negative for dizziness and headaches.   Exam: Physical Exam  Constitutional: She is oriented to person, place, and time.  HENT:  Nose: No mucosal edema.  Mouth/Throat: No oropharyngeal exudate or posterior oropharyngeal edema.  Eyes: Conjunctivae, EOM and lids are normal. Pupils are equal, round, and reactive to light.  Neck: No JVD present. Carotid bruit is not present. No edema present. No thyroid mass and no thyromegaly present.  Cardiovascular: S1 normal and S2 normal.  An irregularly irregular rhythm present. Tachycardia present.  Exam reveals no gallop.   Murmur heard.  Systolic murmur is present with a grade of 2/6  Pulses:      Dorsalis pedis pulses are 2+ on the right side, and 2+ on the left side.  Respiratory: No respiratory distress. She has decreased breath sounds in the right lower field and the left lower field. She has no wheezes. She has no rhonchi. She has no  rales.  GI: Soft. Bowel sounds are normal. There is tenderness in the epigastric area.  Musculoskeletal:       Right ankle: She exhibits swelling.       Left ankle: She exhibits swelling.  Lymphadenopathy:    She has no cervical adenopathy.  Neurological: She is alert and oriented to person, place, and time. No cranial nerve deficit.  Skin: Skin is warm. No rash noted. Nails show no clubbing.  Psychiatric: She has a normal mood and affect.      Data Reviewed: Basic Metabolic Panel:  Recent Labs Lab 06/16/16 2333 06/17/16 0607 06/18/16 0456 06/19/16 0417  NA 136 135 139 136  K 4.1 3.8 4.0 3.4*  CL 98* 103 109 107  CO2 28 25 27 25   GLUCOSE 120* 97 113* 87  BUN 14 15 17 14   CREATININE 1.24* 1.29* 0.95 0.86  CALCIUM 8.9 7.4* 8.5* 8.2*  MG  --   --  1.8  --   PHOS  --   --  3.6  --    Liver Function Tests:  Recent Labs Lab 06/16/16 2333 06/19/16 0417  AST 44* 29  ALT 31 22  ALKPHOS 43 31*  BILITOT 0.8 0.8  PROT 7.9 6.2*  ALBUMIN 4.2 3.3*    Recent Labs Lab 06/16/16 2333 06/19/16 0417  LIPASE 69* 39   CBC:  Recent Labs Lab 06/16/16 2333 06/17/16 0607 06/18/16 0456 06/19/16 0417  WBC 15.0* 20.2* 12.2* 8.9  NEUTROABS 12.5*  --   --   --  HGB 12.8 10.5* 9.7* 9.4*  HCT 37.4 30.8* 27.8* 27.1*  MCV 92.3 92.1 93.0 93.0  PLT 204 179 140* 154   Cardiac Enzymes:  Recent Labs Lab 06/16/16 2333 06/17/16 0246 06/17/16 1030  TROPONINI 0.04* 0.05* 0.04*    CBG:  Recent Labs Lab 06/17/16 1014 06/17/16 1642 06/17/16 2207 06/18/16 0732  GLUCAP 124* 141* 105* 129*    Recent Results (from the past 240 hour(s))  Blood Culture (routine x 2)     Status: None (Preliminary result)   Collection Time: 06/16/16 11:50 PM  Result Value Ref Range Status   Specimen Description BLOOD  RIGHT AC  Final   Special Requests   Final    BOTTLES DRAWN AEROBIC AND ANAEROBIC  ANA 15 ML AER 13ML   Culture NO GROWTH 4 DAYS  Final   Report Status PENDING  Incomplete   Blood Culture (routine x 2)     Status: None (Preliminary result)   Collection Time: 06/17/16 12:19 AM  Result Value Ref Range Status   Specimen Description BLOOD LEFT ARM  Final   Special Requests BOTTLES DRAWN AEROBIC AND ANAEROBIC AER ANA 10ML  Final   Culture NO GROWTH 3 DAYS  Final   Report Status PENDING  Incomplete  Urine culture     Status: Abnormal   Collection Time: 06/17/16  1:28 AM  Result Value Ref Range Status   Specimen Description URINE, RANDOM  Final   Special Requests NONE  Final   Culture (A)  Final    <10,000 COLONIES/mL INSIGNIFICANT GROWTH Performed at [facility]    Report Status 06/18/2016 FINAL  Final  MRSA PCR Screening     Status: None   Collection Time: 06/17/16 10:18 AM  Result Value Ref Range Status   MRSA by PCR NEGATIVE NEGATIVE Final    Comment:        The GeneXpert MRSA Assay (FDA approved for NASAL specimens only), is one component of a comprehensive MRSA colonization surveillance program. It is not intended to diagnose MRSA infection nor to guide or monitor treatment for MRSA infections.       Scheduled Meds: . acidophilus  1 capsule Oral Daily  . aspirin EC  81 mg Oral Daily  . azithromycin  500 mg Oral Daily  . cefTRIAXone (ROCEPHIN)  IV  1 g Intravenous Q24H  . cholecalciferol  1,000 Units Oral Daily  . enoxaparin (LOVENOX) injection  1 mg/kg Subcutaneous Q12H  . fluticasone  2 spray Each Nare Daily  . gabapentin  300 mg Oral TID WC & HS  . hydroxychloroquine  200 mg Oral BID  . Influenza vac split quadrivalent PF  0.5 mL Intramuscular Tomorrow-1000  . loratadine  10 mg Oral Daily  . metoprolol tartrate  25 mg Oral TID  . multivitamin with minerals  1 tablet Oral Daily  . nystatin  5 mL Oral QID  . pantoprazole  40 mg Oral Daily  . pramipexole  0.5 mg Oral BID  . predniSONE  40 mg Oral Q breakfast  . valACYclovir  1,000 mg Oral BID    Assessment/Plan:  1. Persistent nausea vomiting. Zofran change from standing  dose to as needed. Discontinue Phenergan. MRI brain to rule out central cause. CAT scan of the abdomen and pelvis negative. 2. Lip swelling. Can be angioedema. Stop Phenergan, Cardizem CD. Prednisone taper 3. Rapid atrial fibrillation. Discontinue Cardizem CD and try to compensate with metoprolol. Try metoprolol 25 3 times a day. 4. Clinical sepsis with pneumonia  hypotension fever and leukocytosis. On Rocephin and Zithromax.  5. Bilateral pneumonia on Rocephin and Zithromax 6. GERD on Protonix. 7. Hyperlipidemia unspecified. Hold simvastatin 8. Restless leg syndrome on Mirapex 9. History of rheumatoid arthritis with being on immunosuppressants. 10. Patient wishes to be a DO NOT RESUSCITATE  Code Status: Patient just made a DO NOT RESUSCITATE  Family Communication: Family at the bedside  Disposition Plan: To be determined  Consultants:  Cardiology  Antibiotics:  Rocephin  Zithromax  Time spent: 26 minutes  [place], [place]

## 2016-06-20 NOTE — Progress Notes (Signed)
PT Cancellation Note  Patient Details Name: Kaitlyn Huff MRN: [id number] DOB: 1941-01-07   Cancelled Treatment:    Reason Eval/Treat Not Completed: Medical issues which prohibited therapy (HR up to 131 with pt resting comfortably).  Will hold PT until pt more medically appropriate for exertional activity.   Collie Siad PT, DPT 06/20/2016, 3:27 PM

## 2016-06-21 ENCOUNTER — Telehealth: Payer: Self-pay | Admitting: Internal Medicine

## 2016-06-21 LAB — BASIC METABOLIC PANEL
ANION GAP: 7 (ref 5–15)
BUN: 11 mg/dL (ref 6–20)
CHLORIDE: 102 mmol/L (ref 101–111)
CO2: 27 mmol/L (ref 22–32)
Calcium: 8.3 mg/dL — ABNORMAL LOW (ref 8.9–10.3)
Creatinine, Ser: 0.8 mg/dL (ref 0.44–1.00)
Glucose, Bld: 109 mg/dL — ABNORMAL HIGH (ref 65–99)
POTASSIUM: 3.2 mmol/L — AB (ref 3.5–5.1)
SODIUM: 136 mmol/L (ref 135–145)

## 2016-06-21 LAB — CULTURE, BLOOD (ROUTINE X 2): CULTURE: NO GROWTH

## 2016-06-21 MED ORDER — NYSTATIN 100000 UNIT/ML MT SUSP: 5.0000 mL | Freq: Four times a day (QID) | OROMUCOSAL | 0 refills | Status: DC

## 2016-06-21 MED ORDER — CEFUROXIME AXETIL 500 MG PO TABS: 500.0000 mg | ORAL_TABLET | Freq: Two times a day (BID) | ORAL | 0 refills | Status: DC

## 2016-06-21 MED ORDER — METOPROLOL TARTRATE 50 MG PO TABS: 50.0000 mg | ORAL_TABLET | Freq: Two times a day (BID) | ORAL | 0 refills | Status: DC

## 2016-06-21 MED ORDER — PREDNISONE 5 MG PO TABS: ORAL_TABLET | ORAL | 0 refills | Status: DC

## 2016-06-21 MED ORDER — METOPROLOL TARTRATE 50 MG PO TABS
50.0000 mg | ORAL_TABLET | Freq: Two times a day (BID) | ORAL | Status: DC
  Administered 2016-06-21: 50 mg via ORAL
  Filled 2016-06-21: qty 1

## 2016-06-21 MED ORDER — BLISTEX MEDICATED EX OINT: 1.0000 "application " | TOPICAL_OINTMENT | CUTANEOUS | 0 refills | Status: DC | PRN

## 2016-06-21 MED ORDER — PREDNISONE 20 MG PO TABS
30.0000 mg | ORAL_TABLET | Freq: Every day | ORAL | Status: DC
  Administered 2016-06-21: 30 mg via ORAL
  Filled 2016-06-21: qty 1

## 2016-06-21 MED ORDER — ONDANSETRON 4 MG PO TBDP: 4.0000 mg | ORAL_TABLET | Freq: Three times a day (TID) | ORAL | 0 refills | Status: DC | PRN

## 2016-06-21 MED ORDER — HYDROXYCHLOROQUINE SULFATE 200 MG PO TABS: 200.0000 mg | ORAL_TABLET | Freq: Every day | ORAL | Status: DC

## 2016-06-21 NOTE — Telephone Encounter (Signed)
Sherry from [facility] called and stated that pt needs to schedule a 1 week HFU with Dr. Nicki Reaper pt had sepsis. Please advise, thank you!  Call [facility] @ [phone]

## 2016-06-21 NOTE — Progress Notes (Signed)
Discharge instructions given to patient and daughter. IV and tele removed. Education given on medications, pneumonia, and a-fib. Questions answered and patient verbalized understanding.

## 2016-06-21 NOTE — Discharge Summary (Signed)
[facility] NAME: Kaitlyn Huff    MR#:  [id number]  DATE OF BIRTH:  02/20/1941  DATE OF ADMISSION:  06/16/2016 ADMITTING PHYSICIAN: Saundra Shelling, MD  DATE OF DISCHARGE: 06/21/2016 12:20 PM  PRIMARY CARE PHYSICIAN: [name], MD    ADMISSION DIAGNOSIS:  Sepsis, due to unspecified organism ([place]) [A41.9] Community acquired pneumonia of right lower lobe of lung ([place]) [J18.1]  DISCHARGE DIAGNOSIS:  Active Problems:   Sepsis ([place])   Pneumonia   SECONDARY DIAGNOSIS:   Past Medical History:  Diagnosis Date  . Anemia   . Cancer ([place])   . Diverticulitis   . GERD (gastroesophageal reflux disease)   . Hyperlipidemia   . Hypertension   . Osteoarthritis   . Osteoporosis    actonel  . Positive PPD    s/p INH (2006)  . Rheumatoid arthritis(714.0)    MTX transaminitis, Leflunomide (rash), enbrel, plaquinil, prednisone, remicade, Imuran (transaminitis)  . Valvular heart disease    moderate MR and TR    HOSPITAL COURSE:   1. Clinical sepsis with bilateral pneumonia, hypotension, fever, and leukocytosis. The patient initially was admitted to the ICU and required pressors and stress dose steroids. She was placed on Rocephin and Zithromax. She finished the Zithromax course while here in the hospital. Will be prescribed Ceftin a few more days upon discharge home. 2. Persistent nausea and vomiting and abdominal pain. CT scan of the abdomen and pelvis was negative. CAT scan of the head was negative. MRI of the brain was negative for stroke. This settled down and patient was able to tolerate diet. She was given when necessary Zofran and Phenergan during the hospital course. 3. Rapid atrial fibrillation which converted over to normal sinus rhythm. Patient was seen in consultation by Dr. Lorinda Creed. He believe this was secondary to pneumonia and no further anticoagulation needed. Patient given aspirin upon discharge. I put her on Cardizem CD  but I switched it back over to metoprolol 50 mg twice a day. I'm not sure if her lip swelling is secondary to the Cardizem CD or not but since this was a new medication I stopped this medication. 4. Lip swelling. Possible angioedema. I did give Valtrex for blisters and cold sores. Blistex ointment. Cardizem CD and Phenergan were stopped. Prednisone taper. 5. GERD on Protonix 6. Hyperlipidemia unspecified can restart simvastatin as outpatient 7. Restless leg syndrome on Mirapex 8. History of rheumatoid arthritis on chronic prednisone and immunosuppressant as outpatient 9. Patient is a DO NOT RESUSCITATE 10. Recent right leg laser ablation by Dr. Delana Meyer 11. chronic diarrhea stool studies were negative.  DISCHARGE CONDITIONS:   Satisfactory  CONSULTS OBTAINED:  Treatment Team:  Katha Cabal, MD Isaias Cowman, MD  DRUG ALLERGIES:   Allergies  Allergen Reactions  . Astelin [Azelastine Hcl] Other (See Comments)    Reaction:  Unknown   . Ciprofloxacin Other (See Comments)    Reaction:  GI upset   . Codeine Other (See Comments)    Reaction:  Altered mental status  . Dilaudid [Hydromorphone] Other (See Comments)    Reaction:  Unknown   . Flexeril [Cyclobenzaprine] Other (See Comments)    Reaction:  Unknown   . Imuran [Azathioprine] Other (See Comments)    Reaction:  Abnormal liver function  . Keflex [Cephalexin] Other (See Comments)    Reaction:  GI upset   . Lisinopril Itching  . Lyrica [Pregabalin] Other (See Comments)    Reaction:  Sore gums   .  Methotrexate Derivatives Other (See Comments)    Reaction:  Abnormal liver function  . Amoxicillin Rash and Other (See Comments)    Unable to obtain enough information to answer additional questions about this medication.    Jolee Ewing [Leflunomide] Rash  . Clindamycin/Lincomycin Rash  . Doxycycline Rash  . Lodine [Etodolac] Rash  . Percocet [Oxycodone-Acetaminophen] Rash  . Sulfa Antibiotics Rash    DISCHARGE  MEDICATIONS:   Discharge Medication List as of 06/21/2016 11:47 AM    START taking these medications   Details  cefUROXime (CEFTIN) 500 MG tablet Take 1 tablet (500 mg total) by mouth 2 (two) times daily with a meal., Starting Tue 06/21/2016, Print    lip balm (BLISTEX) OINT Apply 1 application topically as needed for lip care., Starting Tue 06/21/2016, Print    metoprolol (LOPRESSOR) 50 MG tablet Take 1 tablet (50 mg total) by mouth 2 (two) times daily., Starting Tue 06/21/2016, Print    nystatin (MYCOSTATIN) 100000 UNIT/ML suspension Take 5 mLs (500,000 Units total) by mouth 4 (four) times daily., Starting Tue 06/21/2016, Print    ondansetron (ZOFRAN ODT) 4 MG disintegrating tablet Take 1 tablet (4 mg total) by mouth every 8 (eight) hours as needed for nausea or vomiting., Starting Tue 06/21/2016, Print      CONTINUE these medications which have CHANGED   Details  hydroxychloroquine (PLAQUENIL) 200 MG tablet Take 1 tablet (200 mg total) by mouth daily., Starting Tue 06/21/2016, No Print    predniSONE (DELTASONE) 5 MG tablet Take 5 tabs po day 1; take 4 tabs po day2; 3 tabs po day3; take 2 tabs po day2; take one tab daily afterwards, Print      CONTINUE these medications which have NOT CHANGED   Details  acidophilus (RISAQUAD) CAPS capsule Take 1 capsule by mouth daily., Until Discontinued, Historical Med    ADVAIR DISKUS 250-50 MCG/DOSE AEPB USE 1 INHALATION EVERY 12 HOURS, Normal    albuterol (PROVENTIL) (2.5 MG/3ML) 0.083% nebulizer solution Take 2.5 mg by nebulization every 6 (six) hours as needed for wheezing or shortness of breath., Historical Med    alendronate (FOSAMAX) 70 MG tablet Take 70 mg by mouth once a week. , Starting Fri 09/04/2015, Historical Med    ALPRAZolam (XANAX) 0.25 MG tablet Take 1 tablet (0.25 mg total) by mouth daily as needed for anxiety., Starting Mon 05/09/2016, Print    aspirin EC 81 MG tablet Take 81 mg by mouth daily., Until Discontinued,  Historical Med    Calcium Carbonate-Vitamin D (CALCIUM 600+D) 600-400 MG-UNIT tablet Take 1 tablet by mouth 2 (two) times daily., Until Discontinued, Historical Med    cetirizine (ZYRTEC) 10 MG tablet Take 1 tablet (10 mg total) by mouth daily., Starting Tue 05/31/2016, Normal    cholecalciferol (VITAMIN D) 1000 units tablet Take 1,000 Units by mouth daily., Until Discontinued, Historical Med    clonazePAM (KLONOPIN) 0.5 MG tablet Take 0.5 mg by mouth 2 (two) times daily as needed for anxiety., Historical Med    colestipol (COLESTID) 1 g tablet Take 2 g by mouth 2 (two) times daily., Until Discontinued, Historical Med    Diphenhyd-Hydrocort-Nystatin (FIRST-DUKES MOUTHWASH) SUSP Pt is to swish and spit 5 cc's tid, Normal    FeFum-FePo-FA-B Cmp-C-Zn-Mn-Cu (TANDEM PLUS) 162-115.2-1 MG CAPS Take 1 capsule by mouth daily., Starting Tue 05/31/2016, Normal    fluticasone (FLONASE) 50 MCG/ACT nasal spray Place 2 sprays into both nostrils daily., Starting Wed 01/27/2016, Normal    furosemide (LASIX) 20 MG tablet Take 1  tablet (20 mg total) by mouth daily., Starting Tue 05/31/2016, Normal    gabapentin (NEURONTIN) 300 MG capsule Take 300 mg by mouth 4 (four) times daily., Until Discontinued, Historical Med    guaiFENesin-dextromethorphan (ROBITUSSIN DM) 100-10 MG/5ML syrup Take 5 mLs by mouth every 4 (four) hours as needed for cough., Starting 08/07/2015, Until Discontinued, Normal    KLOR-CON SPRINKLE 10 MEQ CR capsule TAKE 2 CAPSULES TWICE A DAY, Normal    meloxicam (MOBIC) 15 MG tablet Take 15 mg by mouth daily as needed for pain., Historical Med    Multiple Vitamin (MULTIVITAMIN WITH MINERALS) TABS tablet Take 1 tablet by mouth daily., Until Discontinued, Historical Med    nystatin cream (MYCOSTATIN) APPLY 1 APPLICATION TOPICALLY TO THE AFFECTED AREA (OUTER VAGINA) TWICE A DAY, Historical Med    pramipexole (MIRAPEX) 0.25 MG tablet TAKE 2 TABLETS TWICE A DAY, Normal    PROAIR HFA 108 (90  Base) MCG/ACT inhaler USE 2 INHALATIONS EVERY 4 HOURS AS NEEDED FOR WHEEZING OR SHORTNESS OF BREATH, Normal    RABEprazole (ACIPHEX) 20 MG tablet Take 1 tablet (20 mg total) by mouth 2 (two) times daily before a meal., Starting Fri 04/08/2016, Normal    simvastatin (ZOCOR) 20 MG tablet Take 20 mg by mouth daily. , Historical Med    sodium chloride (OCEAN) 0.65 % nasal spray Place 1 spray into the nose as needed for congestion., Until Discontinued, Historical Med    tiotropium (SPIRIVA) 18 MCG inhalation capsule Place 1 capsule (18 mcg total) into inhaler and inhale daily., Starting Tue 05/31/2016, Until Mon 08/29/2016, Normal    traMADol (ULTRAM) 50 MG tablet Take 50 mg by mouth 2 (two) times daily as needed for moderate pain., Until Discontinued, Historical Med      STOP taking these medications     lansoprazole (PREVACID) 30 MG capsule      pregabalin (LYRICA) 25 MG capsule      TOPROL XL 50 MG 24 hr tablet          DISCHARGE INSTRUCTIONS:   Follow-up PMD one week  If you experience worsening of your admission symptoms, develop shortness of breath, life threatening emergency, suicidal or homicidal thoughts you must seek medical attention immediately by calling 911 or calling your MD immediately  if symptoms less severe.  You Must read complete instructions/literature along with all the possible adverse reactions/side effects for all the Medicines you take and that have been prescribed to you. Take any new Medicines after you have completely understood and accept all the possible adverse reactions/side effects.   Please note  You were cared for by a hospitalist during your hospital stay. If you have any questions about your discharge medications or the care you received while you were in the hospital after you are discharged, you can call the unit and asked to speak with the hospitalist on call if the hospitalist that took care of you is not available. Once you are discharged, your  primary care physician will handle any further medical issues. Please note that NO REFILLS for any discharge medications will be authorized once you are discharged, as it is imperative that you return to your primary care physician (or establish a relationship with a primary care physician if you do not have one) for your aftercare needs so that they can reassess your need for medications and monitor your lab values.    Today   CHIEF COMPLAINT:   Chief Complaint  Patient presents with  . Nausea  .  Emesis    HISTORY OF PRESENT ILLNESS:  Kaitlyn Huff  is a 75 y.o. female presented with nausea and vomiting and found to be in sepsis with pneumonia   VITAL SIGNS:  Blood pressure (!) 144/72, pulse 63, temperature 98.8 F (37.1 C), temperature source Oral, resp. rate 18, height 5\' 4"  (1.626 m), weight 82 kg (180 lb 12.4 oz), SpO2 94 %.   PHYSICAL EXAMINATION:  GENERAL:  75 y.o.-year-old patient lying in the bed with no acute distress.  EYES: Pupils equal, round, reactive to light and accommodation. No scleral icterus. Extraocular muscles intact.  HEENT: Head atraumatic, normocephalic. Oropharynx and nasopharynx clear. Lip swelling better than yesterday. NECK:  Supple, no jugular venous distention. No thyroid enlargement, no tenderness.  LUNGS: Normal breath sounds bilaterally, no wheezing, rales,rhonchi or crepitation. No use of accessory muscles of respiration.  CARDIOVASCULAR: S1, S2 normal. No murmurs, rubs, or gallops.  ABDOMEN: Soft, non-tender, non-distended. Bowel sounds present. No organomegaly or mass.  EXTREMITIES: No pedal edema, cyanosis, or clubbing.  NEUROLOGIC: Cranial nerves II through XII are intact. Muscle strength 5/5 in all extremities. Sensation intact. Gait not checked.  PSYCHIATRIC: The patient is alert and oriented x 3.  SKIN: Lip swelling better than yesterday blistering starting to heal  DATA REVIEW:   CBC  Recent Labs Lab 06/19/16 0417  WBC 8.9  HGB  9.4*  HCT 27.1*  PLT 154    Chemistries   Recent Labs Lab 06/18/16 0456 06/19/16 0417 06/21/16 0550  NA 139 136 136  K 4.0 3.4* 3.2*  CL 109 107 102  CO2 27 25 27   GLUCOSE 113* 87 109*  BUN 17 14 11   CREATININE 0.95 0.86 0.80  CALCIUM 8.5* 8.2* 8.3*  MG 1.8  --   --   AST  --  29  --   ALT  --  22  --   ALKPHOS  --  31*  --   BILITOT  --  0.8  --     Cardiac Enzymes  Recent Labs Lab 06/17/16 1030  TROPONINI 0.04*    Microbiology Results  Results for orders placed or performed during the hospital encounter of 06/16/16  Blood Culture (routine x 2)     Status: None   Collection Time: 06/16/16 11:50 PM  Result Value Ref Range Status   Specimen Description BLOOD  RIGHT AC  Final   Special Requests   Final    BOTTLES DRAWN AEROBIC AND ANAEROBIC  ANA 15 ML AER 13ML   Culture NO GROWTH 5 DAYS  Final   Report Status 06/21/2016 FINAL  Final  Blood Culture (routine x 2)     Status: None (Preliminary result)   Collection Time: 06/17/16 12:19 AM  Result Value Ref Range Status   Specimen Description BLOOD LEFT ARM  Final   Special Requests BOTTLES DRAWN AEROBIC AND ANAEROBIC AER ANA 10ML  Final   Culture NO GROWTH 4 DAYS  Final   Report Status PENDING  Incomplete  Urine culture     Status: Abnormal   Collection Time: 06/17/16  1:28 AM  Result Value Ref Range Status   Specimen Description URINE, RANDOM  Final   Special Requests NONE  Final   Culture (A)  Final    <10,000 COLONIES/mL INSIGNIFICANT GROWTH Performed at [facility]    Report Status 06/18/2016 FINAL  Final  MRSA PCR Screening     Status: None   Collection Time: 06/17/16 10:18 AM  Result Value Ref Range Status  MRSA by PCR NEGATIVE NEGATIVE Final    Comment:        The GeneXpert MRSA Assay (FDA approved for NASAL specimens only), is one component of a comprehensive MRSA colonization surveillance program. It is not intended to diagnose MRSA infection nor to guide or monitor treatment  for MRSA infections.   C difficile quick scan w PCR reflex     Status: None   Collection Time: 06/20/16  4:49 PM  Result Value Ref Range Status   C Diff antigen NEGATIVE NEGATIVE Final   C Diff toxin NEGATIVE NEGATIVE Final   C Diff interpretation No C. difficile detected.  Final  Gastrointestinal Panel by PCR , Stool     Status: None   Collection Time: 06/20/16  4:49 PM  Result Value Ref Range Status   Campylobacter species NOT DETECTED NOT DETECTED Final   Plesimonas shigelloides NOT DETECTED NOT DETECTED Final   Salmonella species NOT DETECTED NOT DETECTED Final   Yersinia enterocolitica NOT DETECTED NOT DETECTED Final   Vibrio species NOT DETECTED NOT DETECTED Final   Vibrio cholerae NOT DETECTED NOT DETECTED Final   Enteroaggregative E coli (EAEC) NOT DETECTED NOT DETECTED Final   Enteropathogenic E coli (EPEC) NOT DETECTED NOT DETECTED Final   Enterotoxigenic E coli (ETEC) NOT DETECTED NOT DETECTED Final   Shiga like toxin producing E coli (STEC) NOT DETECTED NOT DETECTED Final   Shigella/Enteroinvasive E coli (EIEC) NOT DETECTED NOT DETECTED Final   Cryptosporidium NOT DETECTED NOT DETECTED Final   Cyclospora cayetanensis NOT DETECTED NOT DETECTED Final   Entamoeba histolytica NOT DETECTED NOT DETECTED Final   Giardia lamblia NOT DETECTED NOT DETECTED Final   Adenovirus F40/41 NOT DETECTED NOT DETECTED Final   Astrovirus NOT DETECTED NOT DETECTED Final   Norovirus GI/GII NOT DETECTED NOT DETECTED Final   Rotavirus A NOT DETECTED NOT DETECTED Final   Sapovirus (I, II, IV, and V) NOT DETECTED NOT DETECTED Final    RADIOLOGY:  Mr Brain Wo Contrast  Result Date: 06/20/2016 CLINICAL DATA:  75 year old female with generalized weakness and fever. 102.3 Fahrenheit. Nausea vomiting, productive cough. Initial encounter. EXAM: MRI HEAD WITHOUT CONTRAST TECHNIQUE: Multiplanar, multiecho pulse sequences of the brain and surrounding structures were obtained without intravenous  contrast. COMPARISON:  Head CT without contrast 06/19/2016, brain MRI without and with contrast 09/21/2010. FINDINGS: Brain: No restricted diffusion to suggest acute infarction. No midline shift, mass effect, evidence of mass lesion, ventriculomegaly, extra-axial collection or acute intracranial hemorrhage. Cervicomedullary junction and pituitary are within normal limits. Mild progression of patchy in scattered cerebral white matter T2 and FLAIR hyperintensity since 2012. The extent is now mild to moderate for age. The configuration is nonspecific, there is some temporal lobe involvement on the left. No cortical edema or cortical encephalomalacia identified. Insula and mesial temporal lobe structures appear normal. 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 Other deep gray matter nuclei appear normal. Negative brainstem and cerebellum. Vascular: Major intracranial vascular flow voids are stable since 2012, with mild intracranial artery dolichoectasia. Skull and upper cervical spine: Negative. Normal bone marrow signal. Sinuses/Orbits: Postoperative changes to both globes, otherwise negative orbits soft tissues. Trace paranasal sinus mucosal thickening. Trace mastoid fluid more so on the left. Negative nasopharynx. Other: Visible internal auditory structures appear normal. Negative scalp soft tissues. IMPRESSION: 1.  No acute intracranial abnormality. 2. Mild to moderate for age nonspecific white matter signal changes in the brain, most commonly due to chronic small vessel disease, with some  progression since 2012 Electronically Signed   By: Genevie Ann M.D.   On: 06/20/2016 14:44    Management plans discussed with the patient, family and they are in agreement.  CODE STATUS:  Code Status History    Date Active Date Inactive Code Status Order ID Comments User Context   06/19/2016  1:20 PM 06/21/2016  3:29 PM DNR [id number]  Loletha Grayer, MD Inpatient   06/17/2016  2:01 AM 06/19/2016   1:20 PM Full Code [id number]  Saundra Shelling, MD ED   08/18/2015  4:28 PM 08/22/2015  4:15 PM Full Code [id number]  Lytle Butte, MD ED   08/03/2015  9:49 AM 08/07/2015  2:11 PM Full Code [id number]  Saundra Shelling, MD Inpatient    Questions for Most Recent Historical Code Status (Order [id number])    Question Answer Comment   In the event of cardiac or respiratory ARREST Do not call a "code blue"    In the event of cardiac or respiratory ARREST Do not perform Intubation, CPR, defibrillation or ACLS    In the event of cardiac or respiratory ARREST Use medication by any route, position, wound care, and other measures to relive pain and suffering. May use oxygen, suction and manual treatment of airway obstruction as needed for comfort.    Comments Nurse may pronounce       TOTAL TIME TAKING CARE OF THIS PATIENT: 35 minutes.    Loletha Grayer M.D on 06/21/2016 at 4:05 PM  Between 7am to 6pm - Pager - [phone]  After 6pm go to [email or web address] - password [email or web address] [facility]  [phone]  CC: Primary care physician; [name], MD

## 2016-06-21 NOTE — Progress Notes (Signed)
Upon arrival for day shift, primary RN noticed patient was sinus brady on monitor around 58. Night shift was not notified by CCMD of conversion from afib. Called CCMD and they stated patient converted to NSR around 2319 last night. Patient is currently sleeping and heart rate is maintaining in the upper 50's.

## 2016-06-21 NOTE — Discharge Instructions (Signed)
Community-Acquired Pneumonia, Adult Introduction Pneumonia is an infection of the lungs. One type of pneumonia can happen while a person is in a hospital. A different type can happen when a person is not in a hospital (community-acquired pneumonia). It is easy for this kind to spread from person to person. It can spread to you if you breathe near an infected person who coughs or sneezes. Some symptoms include:  A dry cough.  A wet (productive) cough.  Fever.  Sweating.  Chest pain. Follow these instructions at home:  Take over-the-counter and prescription medicines only as told by your doctor.  Only take cough medicine if you are losing sleep.  If you were prescribed an antibiotic medicine, take it as told by your doctor. Do not stop taking the antibiotic even if you start to feel better.  Sleep with your head and neck raised (elevated). You can do this by putting a few pillows under your head, or you can sleep in a recliner.  Do not use tobacco products. These include cigarettes, chewing tobacco, and e-cigarettes. If you need help quitting, ask your doctor.  Drink enough water to keep your pee (urine) clear or pale yellow. A shot (vaccine) can help prevent pneumonia. Shots are often suggested for:  People older than 73 years of age.  People older than 73 years of age:  Who are having cancer treatment.  Who have long-term (chronic) lung disease.  Who have problems with their body's defense system (immune system). You may also prevent pneumonia if you take these actions:  Get the flu (influenza) shot every year.  Go to the dentist as often as told.  Wash your hands often. If soap and water are not available, use hand sanitizer. Contact a doctor if:  You have a fever.  You lose sleep because your cough medicine does not help. Get help right away if:  You are short of breath and it gets worse.  You have more chest pain.  Your sickness gets worse. This is very  serious if:  You are an older adult.  Your body's defense system is weak.  You cough up blood. This information is not intended to replace advice given to you by your health care provider. Make sure you discuss any questions you have with your health care provider. Document Released: 12/14/2007 Document Revised: 12/03/2015 Document Reviewed: 10/22/2014  2017 Elsevier  Sepsis, Adult Sepsis is a serious infection of your blood or tissues that affects your whole body. The infection that causes sepsis may be bacterial, viral, fungal, or parasitic. Sepsis may be life threatening. Sepsis can cause your blood pressure to drop. This may result in shock. Shock causes your central nervous system and your organs to stop working correctly. What increases the risk? Sepsis can happen in anyone, but it is more likely to happen in people who have weakened immune systems. What are the signs or symptoms? Symptoms of sepsis can include:  Fever or low body temperature (hypothermia).  Rapid breathing (hyperventilation).  Chills.  Rapid heartbeat (tachycardia).  Confusion or light-headedness.  Trouble breathing.  Urinating much less than usual.  Cool, clammy skin or red, flushed skin.  Other problems with the heart, kidneys, or brain. How is this diagnosed? Your health care provider will likely do tests to look for an infection, to see if the infection has spread to your blood, and to see how serious your condition is. Tests can include:  Blood tests, including cultures of your blood.  Cultures of other  fluids from your body, such as:  Urine.  Pus from wounds.  Mucus coughed up from your lungs.  Urine tests other than cultures.  X-ray exams or other imaging tests. How is this treated? Treatment will begin with elimination of the source of infection. If your sepsis is likely caused by a bacterial or fungal infection, you will be given antibiotic or antifungal medicines. You may also  receive:  Oxygen.  Fluids through an IV tube.  Medicines to increase your blood pressure.  A machine to clean your blood (dialysis) if your kidneys fail.  A machine to help you breathe if your lungs fail. Get help right away if: You get an infection or develop any of the signs and symptoms of sepsis after surgery or a hospitalization. This information is not intended to replace advice given to you by your health care provider. Make sure you discuss any questions you have with your health care provider. Document Released: 03/26/2003 Document Revised: 12/03/2015 Document Reviewed: 03/04/2013 Elsevier Interactive Patient Education  2017 [employer].

## 2016-06-21 NOTE — Evaluation (Addendum)
Physical Therapy Evaluation Patient Details Name: Kaitlyn Huff MRN: [id number] DOB: 1941/01/12 Today's Date: 06/21/2016   History of Present Illness  Pt is a 75 y/o F who presented to the ED with generalized weakness and fever with some nausea and vomiting.  Pt uses O2 at night and has h/o COPD.  Pt was found to have pneumonia.  Pt was in rapid a-fib but converted to NSR on 12/11.  Pt had a varicose vein stripped on RLE the day prior.  Pt's PMH includes anemia, osteoporosis.    Clinical Impression  Pt admitted with above diagnosis. Kaitlyn Huff is independent with all mobility this session.  She lives with her daughter who will be able to help with IADLs at d/c as needed.  Pt scored 23/24 on DGI indicating pt is at a low risk of falling.  Encouraged pt to remain active ambulating at hospital (with supervision). No skilled PT needs identified.  PT will sign off.    Follow Up Recommendations No PT follow up    Equipment Recommendations  None recommended by PT    Recommendations for Other Services       Precautions / Restrictions Precautions Precautions: Other (comment) Precaution Comments: monitor HR, SpO2 Restrictions Weight Bearing Restrictions: No      Mobility  Bed Mobility               General bed mobility comments: Pt sitting in recliner chair upon PT arrival  Transfers Overall transfer level: Independent Equipment used: None             General transfer comment: No unsteadiness and pt performs with ease.  No physical assist or cues.  Ambulation/Gait Ambulation/Gait assistance: Independent Ambulation Distance (Feet): 250 Feet Assistive device: None Gait Pattern/deviations: WFL(Within Functional Limits)   Gait velocity interpretation: at or above normal speed for age/gender General Gait Details: No gait abnormalities and no instability noted.  HR in the 80s and SpO2 at or above 92% on RA.    Stairs Stairs: Yes   Stair Management: One rail  Right;Alternating pattern;Forwards Number of Stairs: 3 (limited due to IV pole) General stair comments: No assist needed and no instability noted.  Pt holds onto railing for support with alternating pattern.  Wheelchair Mobility    Modified Rankin (Stroke Patients Only)       Balance Overall balance assessment: Modified Independent                               Standardized Balance Assessment Standardized Balance Assessment : Dynamic Gait Index   Dynamic Gait Index Level Surface: Normal Change in Gait Speed: Normal Gait with Horizontal Head Turns: Normal Gait with Vertical Head Turns: Normal Gait and Pivot Turn: Normal Step Over Obstacle: Normal Step Around Obstacles: Normal Steps: Mild Impairment Total Score: 23       Pertinent Vitals/Pain Pain Assessment: No/denies pain    Home Living Family/patient expects to be discharged to:: Private residence Living Arrangements: Children Available Help at Discharge: Family;Available 24 hours/day Type of Home: House Home Access: Stairs to enter Entrance Stairs-Rails: Left;Right;Can reach both Entrance Stairs-Number of Steps: 5 Home Layout: Laundry or work area in basement;Able to live on main level with bedroom/bathroom Home Equipment: None      Prior Function Level of Independence: Independent         Comments: Independent with ADLs. Assist from daughter with IADLs     Hand Dominance  Extremity/Trunk Assessment   Upper Extremity Assessment: Overall WFL for tasks assessed           Lower Extremity Assessment: Overall WFL for tasks assessed      Cervical / Trunk Assessment: Normal  Communication   Communication: No difficulties  Cognition Arousal/Alertness: Awake/alert Behavior During Therapy: WFL for tasks assessed/performed Overall Cognitive Status: Within Functional Limits for tasks assessed                      General Comments General comments (skin integrity, edema,  etc.): SpO2 remained at or above 92% on RA throughout session.    Exercises General Exercises - Upper Extremity Shoulder Flexion: AROM;Both;10 reps;Seated General Exercises - Lower Extremity Ankle Circles/Pumps: AROM;Both;10 reps;Seated Long Arc Quad: AROM;Both;10 reps;Seated Hip Flexion/Marching: Both;10 reps;Seated   Assessment/Plan    PT Assessment Patent does not need any further PT services  PT Problem List            PT Treatment Interventions      PT Goals (Current goals can be found in the Care Plan section)  Acute Rehab PT Goals Patient Stated Goal: to go home PT Goal Formulation: All assessment and education complete, DC therapy    Frequency     Barriers to discharge        Co-evaluation               End of Session Equipment Utilized During Treatment: Gait belt Activity Tolerance: Patient tolerated treatment well Patient left: in chair;with call bell/phone within reach;with chair alarm set;with family/visitor present Nurse Communication: Mobility status;Other (comment) (SpO2, HR.  Also communicated this with MD.)         Time: [id number] PT Time Calculation (min) (ACUTE ONLY): 24 min   Charges:   PT Evaluation $PT Eval Low Complexity: 1 Procedure PT Treatments $Therapeutic Exercise: 8-22 mins   PT G Codes:       Kaitlyn Huff PT, DPT 06/21/2016, 10:35 AM

## 2016-06-21 NOTE — Progress Notes (Signed)
Pharmacy Antibiotic Note  Kaitlyn Huff is a 75 y.o. female admitted on 06/16/2016 with pneumonia.  Pharmacy has been consulted for ceftriaxone and azithromycin dosing.  Plan: Azithromycin 500 mg PO daily - 5 day course completed today  Continue ceftriaxone 1 g IV daily for total of 7 days per MD. Stop date entered  Height: 5\' 4"  (162.6 cm) Weight: 180 lb 12.4 oz (82 kg) IBW/kg (Calculated) : 54.7  Temp (24hrs), Avg:98.6 F (37 C), Min:98.4 F (36.9 C), Max:98.8 F (37.1 C)   Recent Labs Lab 06/16/16 2333 06/16/16 2350 06/17/16 0246 06/17/16 0607 06/17/16 0749 06/17/16 1030 06/18/16 0456 06/19/16 0417 06/21/16 0550  WBC 15.0*  --   --  20.2*  --   --  12.2* 8.9  --   CREATININE 1.24*  --   --  1.29*  --   --  0.95 0.86 0.80  LATICACIDVEN  --  2.4* 2.4*  --  1.5 1.4  --   --   --     Estimated Creatinine Clearance: 64.9 mL/min (by C-G formula based on SCr of 0.8 mg/dL).    Allergies  Allergen Reactions  . Astelin [Azelastine Hcl] Other (See Comments)    Reaction:  Unknown   . Ciprofloxacin Other (See Comments)    Reaction:  GI upset   . Codeine Other (See Comments)    Reaction:  Altered mental status  . Dilaudid [Hydromorphone] Other (See Comments)    Reaction:  Unknown   . Flexeril [Cyclobenzaprine] Other (See Comments)    Reaction:  Unknown   . Imuran [Azathioprine] Other (See Comments)    Reaction:  Abnormal liver function  . Keflex [Cephalexin] Other (See Comments)    Reaction:  GI upset   . Lisinopril Itching  . Lyrica [Pregabalin] Other (See Comments)    Reaction:  Sore gums   . Methotrexate Derivatives Other (See Comments)    Reaction:  Abnormal liver function  . Amoxicillin Rash and Other (See Comments)    Unable to obtain enough information to answer additional questions about this medication.    Jolee Ewing [Leflunomide] Rash  . Clindamycin/Lincomycin Rash  . Doxycycline Rash  . Lodine [Etodolac] Rash  . Percocet [Oxycodone-Acetaminophen] Rash  .  Sulfa Antibiotics Rash   Antimicrobials this admission: CTX 12/8 >>  azithromycin 12/8 >>   Dose adjustments this admission:  Microbiology results: 12/7 BCx: Negative, final 12/8 UCx: Insignificant growth  12/8 MRSA PCR: negative  Thank you for allowing pharmacy to be a part of this patient's care.  Lenis Noon, PharmD 06/21/2016 10:41 AM

## 2016-06-22 ENCOUNTER — Telehealth: Payer: Self-pay | Admitting: Internal Medicine

## 2016-06-22 LAB — CULTURE, BLOOD (ROUTINE X 2): CULTURE: NO GROWTH

## 2016-06-22 NOTE — Telephone Encounter (Signed)
Transition Care Management Follow-up Telephone Call  How have you been since you were released from the hospital? Tired, but well.  Resting now on the couch.    Do you understand why you were in the hospital? Yes, for pneumonia and sepsis.    Do you understand the discharge instrcutions? Trying too, gave new medications, daughter getting from the pharmacy, daughter helping to get them straightened out.   Items Reviewed:  Medications reviewed: yes, changes can't remember all the changes, but knows she was put on a antibiotic and it is giving her diarrhea.  Asked if she was taking a probiotic and she agreed that she is.    Allergies reviewed: yes, no changes  Dietary changes reviewed: yes, no changes  Referrals reviewed: only to see Dr. Nicki Reaper.    Functional Questionnaire:   Activities of Daily Living (ADLs):   She states they are independent in the following: independent, but it Takes a lot out of her to do her tasks.  States they require assistance with the following: none   Any transportation issues/concerns?: daughter will bring to the appt.    Any patient concerns? No major concerns   Confirmed importance and date/time of follow-up visits scheduled: 07/01/16 at 300pm with PCP   Confirmed with patient if condition begins to worsen call PCP or go to the ER.  Patient was given the Call-a-Nurse line [phone]: yes, verbalized understanding

## 2016-06-22 NOTE — Telephone Encounter (Signed)
See additional note in patients chart, thanks

## 2016-06-22 NOTE — Telephone Encounter (Signed)
I am calling her today to do a TCM, please advise on Schedule for appt?

## 2016-06-22 NOTE — Telephone Encounter (Signed)
HFU, Pt called to sch appt for a HFU. Pt was discharged from the hospital yesterday. Dx was pneumonia , sepsis, Afib. No appt avail. Pt needs to be seen 1 week from yesterday. Thank you!

## 2016-06-22 NOTE — Telephone Encounter (Signed)
I can see her 07/01/16 at 3:00.

## 2016-06-27 ENCOUNTER — Other Ambulatory Visit (INDEPENDENT_AMBULATORY_CARE_PROVIDER_SITE_OTHER): Payer: Self-pay | Admitting: Vascular Surgery

## 2016-06-27 DIAGNOSIS — I872 Venous insufficiency (chronic) (peripheral): Secondary | ICD-10-CM

## 2016-06-28 ENCOUNTER — Ambulatory Visit (INDEPENDENT_AMBULATORY_CARE_PROVIDER_SITE_OTHER): Payer: Medicare Other

## 2016-06-28 ENCOUNTER — Other Ambulatory Visit (INDEPENDENT_AMBULATORY_CARE_PROVIDER_SITE_OTHER): Payer: Medicare Other

## 2016-06-28 ENCOUNTER — Other Ambulatory Visit: Payer: Medicare Other

## 2016-06-28 DIAGNOSIS — I10 Essential (primary) hypertension: Secondary | ICD-10-CM | POA: Diagnosis not present

## 2016-06-28 DIAGNOSIS — E7800 Pure hypercholesterolemia, unspecified: Secondary | ICD-10-CM | POA: Diagnosis not present

## 2016-06-28 DIAGNOSIS — I8312 Varicose veins of left lower extremity with inflammation: Secondary | ICD-10-CM

## 2016-06-28 DIAGNOSIS — D649 Anemia, unspecified: Secondary | ICD-10-CM | POA: Diagnosis not present

## 2016-06-28 DIAGNOSIS — I872 Venous insufficiency (chronic) (peripheral): Secondary | ICD-10-CM

## 2016-06-28 DIAGNOSIS — I8311 Varicose veins of right lower extremity with inflammation: Secondary | ICD-10-CM | POA: Diagnosis not present

## 2016-06-28 DIAGNOSIS — I83813 Varicose veins of bilateral lower extremities with pain: Secondary | ICD-10-CM

## 2016-06-28 LAB — BASIC METABOLIC PANEL
BUN: 10 mg/dL (ref 6–23)
CHLORIDE: 99 meq/L (ref 96–112)
CO2: 32 meq/L (ref 19–32)
Calcium: 9.5 mg/dL (ref 8.4–10.5)
Creatinine, Ser: 0.93 mg/dL (ref 0.40–1.20)
GFR: 62.78 mL/min (ref >60.00)
GLUCOSE: 84 mg/dL (ref 70–99)
Potassium: 4.2 mEq/L (ref 3.5–5.1)
SODIUM: 139 meq/L (ref 135–145)

## 2016-06-28 LAB — HEPATIC FUNCTION PANEL
ALBUMIN: 4 g/dL (ref 3.5–5.2)
ALK PHOS: 42 U/L (ref 39–117)
ALT: 29 U/L (ref 0–35)
AST: 22 U/L (ref 0–37)
Bilirubin, Direct: 0.1 mg/dL (ref 0.0–0.3)
TOTAL PROTEIN: 6.6 g/dL (ref 6.0–8.3)
Total Bilirubin: 0.5 mg/dL (ref 0.2–1.2)

## 2016-06-28 LAB — CBC WITH DIFFERENTIAL/PLATELET
BASOS PCT: 0.5 % (ref 0.0–3.0)
Basophils Absolute: 0 10*3/uL (ref 0.0–0.1)
EOS PCT: 0.6 % (ref 0.0–5.0)
Eosinophils Absolute: 0.1 10*3/uL (ref 0.0–0.7)
HCT: 31.8 % — ABNORMAL LOW (ref 36.0–46.0)
Hemoglobin: 10.5 g/dL — ABNORMAL LOW (ref 12.0–15.0)
LYMPHS ABS: 2.7 10*3/uL (ref 0.7–4.0)
Lymphocytes Relative: 30.1 % (ref 12.0–46.0)
MCHC: 33.1 g/dL (ref 30.0–36.0)
MCV: 94.1 fl (ref 78.0–100.0)
MONO ABS: 0.8 10*3/uL (ref 0.1–1.0)
MONOS PCT: 8.2 % (ref 3.0–12.0)
NEUTROS ABS: 5.5 10*3/uL (ref 1.4–7.7)
NEUTROS PCT: 60.6 % (ref 43.0–77.0)
Platelets: 287 10*3/uL (ref 150.0–400.0)
RBC: 3.38 Mil/uL — AB (ref 3.87–5.11)
RDW: 13.1 % (ref 11.5–15.5)
WBC: 9.1 10*3/uL (ref 4.0–10.5)

## 2016-06-28 LAB — LIPID PANEL
CHOLESTEROL: 115 mg/dL (ref 0–200)
HDL: 60.9 mg/dL (ref >39.00)
LDL Cholesterol: 40 mg/dL (ref 0–99)
NONHDL: 53.94
Total CHOL/HDL Ratio: 2
Triglycerides: 72 mg/dL (ref 0.0–149.0)
VLDL: 14.4 mg/dL (ref 0.0–40.0)

## 2016-06-28 LAB — FERRITIN: Ferritin: 175.6 ng/mL (ref 10.0–291.0)

## 2016-06-29 ENCOUNTER — Other Ambulatory Visit (INDEPENDENT_AMBULATORY_CARE_PROVIDER_SITE_OTHER): Payer: Medicare Other

## 2016-06-29 ENCOUNTER — Other Ambulatory Visit: Payer: Self-pay | Admitting: Internal Medicine

## 2016-06-29 DIAGNOSIS — D649 Anemia, unspecified: Secondary | ICD-10-CM

## 2016-06-29 DIAGNOSIS — M79604 Pain in right leg: Secondary | ICD-10-CM

## 2016-06-29 DIAGNOSIS — M79605 Pain in left leg: Secondary | ICD-10-CM

## 2016-06-29 DIAGNOSIS — G2581 Restless legs syndrome: Secondary | ICD-10-CM

## 2016-06-29 DIAGNOSIS — M069 Rheumatoid arthritis, unspecified: Secondary | ICD-10-CM

## 2016-06-29 LAB — IBC PANEL
IRON: 54 ug/dL (ref 42–145)
Saturation Ratios: 16.2 % — ABNORMAL LOW (ref 20.0–50.0)
Transferrin: 238 mg/dL (ref 212.0–360.0)

## 2016-06-29 NOTE — Progress Notes (Signed)
[facility] panel added to blood drawn.

## 2016-07-01 ENCOUNTER — Ambulatory Visit (INDEPENDENT_AMBULATORY_CARE_PROVIDER_SITE_OTHER): Payer: Medicare Other

## 2016-07-01 ENCOUNTER — Ambulatory Visit (INDEPENDENT_AMBULATORY_CARE_PROVIDER_SITE_OTHER): Payer: Medicare Other | Admitting: Internal Medicine

## 2016-07-01 ENCOUNTER — Encounter: Payer: Self-pay | Admitting: Internal Medicine

## 2016-07-01 VITALS — BP 140/76 | HR 56 | Temp 98.2°F | Ht 64.0 in | Wt 168.8 lb

## 2016-07-01 DIAGNOSIS — R0902 Hypoxemia: Secondary | ICD-10-CM | POA: Diagnosis not present

## 2016-07-01 DIAGNOSIS — I4891 Unspecified atrial fibrillation: Secondary | ICD-10-CM | POA: Diagnosis not present

## 2016-07-01 DIAGNOSIS — J189 Pneumonia, unspecified organism: Secondary | ICD-10-CM

## 2016-07-01 DIAGNOSIS — K219 Gastro-esophageal reflux disease without esophagitis: Secondary | ICD-10-CM | POA: Diagnosis not present

## 2016-07-01 DIAGNOSIS — M069 Rheumatoid arthritis, unspecified: Secondary | ICD-10-CM | POA: Diagnosis not present

## 2016-07-01 DIAGNOSIS — I10 Essential (primary) hypertension: Secondary | ICD-10-CM

## 2016-07-01 DIAGNOSIS — M79671 Pain in right foot: Secondary | ICD-10-CM

## 2016-07-01 DIAGNOSIS — J181 Lobar pneumonia, unspecified organism: Secondary | ICD-10-CM

## 2016-07-01 DIAGNOSIS — S92321A Displaced fracture of second metatarsal bone, right foot, initial encounter for closed fracture: Secondary | ICD-10-CM | POA: Diagnosis not present

## 2016-07-01 MED ORDER — METOPROLOL TARTRATE 50 MG PO TABS: 50.0000 mg | ORAL_TABLET | Freq: Two times a day (BID) | ORAL | 1 refills | Status: DC

## 2016-07-01 MED ORDER — CETIRIZINE HCL 10 MG PO TABS: 10.0000 mg | ORAL_TABLET | Freq: Every day | ORAL | 1 refills | Status: DC

## 2016-07-01 NOTE — Progress Notes (Signed)
Patient ID: Kaitlyn Huff, female   DOB: 1941/05/06, 75 y.o.   MRN: [id number]   Subjective:    Patient ID: Kaitlyn Huff, female    DOB: 1940/09/19, 75 y.o.   MRN: [id number]  HPI  Patient here for hospital follow up.  Was hospitalized 06/16/16 with sepsis/CAP or RLL.  She required pressors and stress dose steroids.  Completed abx.  She is accompanied by her daughter.  History obtained from both of them.  Had nausea and vomiting and abdominal pain while in the hospital.  CT abdomen and pelvis negative.  This improved and she is eating now.  No vomiting.  She also had an episode of rapid afib.  Evaluated by cardiology.  Felt to be secondary to pneumonia and did not feel anticoagulation was necessary.  She is on metoprolol.  She reports she feels better.  Breathing is better.  Still some cough, but better.  Using oxygen at night.  Has noticed some light headedness with standing.  No persistent dizziness.  She had recent pain and swelling in her right foot.  Was concerned over arthritis flare.  Was placed on steroid taper.  Some improvement, but still with pain and swelling.  Feels different than her other flares.  No known injury or trauma.     Past Medical History:  Diagnosis Date  . Anemia   . Cancer ([place])   . Diverticulitis   . GERD (gastroesophageal reflux disease)   . Hyperlipidemia   . Hypertension   . Osteoarthritis   . Osteoporosis    actonel  . Positive PPD    s/p INH (2006)  . Rheumatoid arthritis(714.0)    MTX transaminitis, Leflunomide (rash), enbrel, plaquinil, prednisone, remicade, Imuran (transaminitis)  . 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 ABDOMINAL HYSTERECTOMY    . CERVICAL CONE BIOPSY    . CHOLECYSTECTOMY  06/22/14  . COLONOSCOPY WITH PROPOFOL N/A 07/09/2015   Procedure: COLONOSCOPY WITH PROPOFOL;  Surgeon: Manya Silvas, MD;  Location[facility] ENDOSCOPY;  Service: Endoscopy;  Laterality: N/A;  .  ESOPHAGOGASTRODUODENOSCOPY (EGD) WITH PROPOFOL N/A 07/09/2015   Procedure: ESOPHAGOGASTRODUODENOSCOPY (EGD) WITH PROPOFOL;  Surgeon: Manya Silvas, MD;  Location: [facility] ENDOSCOPY;  Service: Endoscopy;  Laterality: N/A;  . OVARY SURGERY    . TRACHEOSTOMY  1959  . TUBAL LIGATION     Family History  Problem Relation Age of Onset  . Heart disease Father     MI  . Heart disease Mother   . Valvular heart disease Mother   . Breast cancer Sister 1  . Cancer Sister     Lung cancer  . Colon cancer Neg Hx    Social History   Social History  . Marital status: Widowed    Spouse name: N/A  . Number of children: 4  . Years of education: N/A   Occupational History  . retired    Social History Main Topics  . Smoking status: Never Smoker  . Smokeless tobacco: Never Used  . Alcohol use No  . Drug use: No  . Sexual activity: No   Other Topics Concern  . None   Social History Narrative   Lives with family at home    Outpatient Encounter Prescriptions as of 07/01/2016  Medication Sig  . acidophilus (RISAQUAD) CAPS capsule Take 1 capsule by mouth daily.  Marland Kitchen albuterol (PROVENTIL) (2.5 MG/3ML) 0.083% nebulizer solution Take 2.5 mg by nebulization every  6 (six) hours as needed for wheezing or shortness of breath.  Marland Kitchen alendronate (FOSAMAX) 70 MG tablet Take 70 mg by mouth once a week.   . ALPRAZolam (XANAX) 0.25 MG tablet Take 1 tablet (0.25 mg total) by mouth daily as needed for anxiety.  Marland Kitchen aspirin EC 81 MG tablet Take 81 mg by mouth daily.  . Calcium Carbonate-Vitamin D (CALCIUM 600+D) 600-400 MG-UNIT tablet Take 1 tablet by mouth 2 (two) times daily.  . cefUROXime (CEFTIN) 500 MG tablet Take 1 tablet (500 mg total) by mouth 2 (two) times daily with a meal.  . cetirizine (ZYRTEC) 10 MG tablet Take 1 tablet (10 mg total) by mouth daily.  . cholecalciferol (VITAMIN D) 1000 units tablet Take 1,000 Units by mouth daily.  . clonazePAM (KLONOPIN) 0.5 MG tablet Take 0.5 mg by mouth 2 (two)  times daily as needed for anxiety.  . colestipol (COLESTID) 1 g tablet Take 2 g by mouth 2 (two) times daily.  . Diphenhyd-Hydrocort-Nystatin (FIRST-DUKES MOUTHWASH) SUSP Pt is to swish and spit 5 cc's tid  . FeFum-FePo-FA-B Cmp-C-Zn-Mn-Cu (TANDEM PLUS) 162-115.2-1 MG CAPS Take 1 capsule by mouth daily.  . fluticasone (FLONASE) 50 MCG/ACT nasal spray Place 2 sprays into both nostrils daily.  . furosemide (LASIX) 20 MG tablet Take 1 tablet (20 mg total) by mouth daily.  Marland Kitchen gabapentin (NEURONTIN) 300 MG capsule Take 300 mg by mouth 4 (four) times daily.  Marland Kitchen guaiFENesin-dextromethorphan (ROBITUSSIN DM) 100-10 MG/5ML syrup Take 5 mLs by mouth every 4 (four) hours as needed for cough.  . hydroxychloroquine (PLAQUENIL) 200 MG tablet Take 1 tablet (200 mg total) by mouth daily.  Marland Kitchen KLOR-CON SPRINKLE 10 MEQ CR capsule TAKE 2 CAPSULES TWICE A DAY  . lip balm (BLISTEX) OINT Apply 1 application topically as needed for lip care.  . meloxicam (MOBIC) 15 MG tablet Take 15 mg by mouth daily as needed for pain.  . metoprolol (LOPRESSOR) 50 MG tablet Take 1 tablet (50 mg total) by mouth 2 (two) times daily.  . Multiple Vitamin (MULTIVITAMIN WITH MINERALS) TABS tablet Take 1 tablet by mouth daily.  Marland Kitchen nystatin (MYCOSTATIN) 100000 UNIT/ML suspension Take 5 mLs (500,000 Units total) by mouth 4 (four) times daily.  Marland Kitchen nystatin cream (MYCOSTATIN) APPLY 1 APPLICATION TOPICALLY TO THE AFFECTED AREA (OUTER VAGINA) TWICE A DAY  . ondansetron (ZOFRAN ODT) 4 MG disintegrating tablet Take 1 tablet (4 mg total) by mouth every 8 (eight) hours as needed for nausea or vomiting.  . pramipexole (MIRAPEX) 0.25 MG tablet TAKE 2 TABLETS TWICE A DAY  . predniSONE (DELTASONE) 5 MG tablet Take 5 tabs po day 1; take 4 tabs po day2; 3 tabs po day3; take 2 tabs po day2; take one tab daily afterwards  . RABEprazole (ACIPHEX) 20 MG tablet Take 1 tablet (20 mg total) by mouth 2 (two) times daily before a meal.  . simvastatin (ZOCOR) 20 MG tablet  Take 20 mg by mouth daily.   . sodium chloride (OCEAN) 0.65 % nasal spray Place 1 spray into the nose as needed for congestion.  Marland Kitchen tiotropium (SPIRIVA) 18 MCG inhalation capsule Place 1 capsule (18 mcg total) into inhaler and inhale daily.  . traMADol (ULTRAM) 50 MG tablet Take 50 mg by mouth 2 (two) times daily as needed for moderate pain.  . [DISCONTINUED] ADVAIR DISKUS 250-50 MCG/DOSE AEPB USE 1 INHALATION EVERY 12 HOURS  . [DISCONTINUED] cetirizine (ZYRTEC) 10 MG tablet Take 1 tablet (10 mg total) by mouth daily.  . [  DISCONTINUED] metoprolol (LOPRESSOR) 50 MG tablet Take 1 tablet (50 mg total) by mouth 2 (two) times daily.  . [DISCONTINUED] metoprolol (LOPRESSOR) 50 MG tablet Take 1 tablet (50 mg total) by mouth 2 (two) times daily.  . [DISCONTINUED] PROAIR HFA 108 (90 Base) MCG/ACT inhaler USE 2 INHALATIONS EVERY 4 HOURS AS NEEDED FOR WHEEZING OR SHORTNESS OF BREATH   No facility-administered encounter medications on file as of 07/01/2016.     Review of Systems  Constitutional: Negative for appetite change and unexpected weight change.       Eating well.  Appetite is better.    HENT: Negative for congestion and sinus pressure.   Respiratory: Negative for chest tightness and shortness of breath.        Cough is better.    Cardiovascular: Negative for chest pain, palpitations and leg swelling.  Gastrointestinal: Negative for abdominal pain, diarrhea, nausea and vomiting.  Genitourinary: Negative for difficulty urinating and dysuria.  Musculoskeletal: Negative for back pain.       Right foot pain as outlined.  Swelling as outlined.    Skin: Negative for color change and rash.  Neurological: Positive for light-headedness. Negative for headaches.  Psychiatric/Behavioral: Negative for agitation and dysphoric mood.       Objective:    Physical Exam  Constitutional: She appears well-developed and well-nourished. No distress.  HENT:  Nose: Nose normal.  Mouth/Throat: Oropharynx is  clear and moist.  Neck: Neck supple. No thyromegaly present.  Cardiovascular: Normal rate and regular rhythm.   Pulmonary/Chest: Breath sounds normal. No respiratory distress. She has no wheezes.  Abdominal: Soft. Bowel sounds are normal. There is no tenderness.  Musculoskeletal: She exhibits no edema or tenderness.  Increased pain in her right foot with palpation.  Increased soft tissue swelling.    Lymphadenopathy:    She has no cervical adenopathy.  Skin: No rash noted. No erythema.  Psychiatric: She has a normal mood and affect. Her behavior is normal.    BP 140/76   Pulse (!) 56   Temp 98.2 F (36.8 C) (Oral)   Ht 5\' 4"  (1.626 m)   Wt 168 lb 12.8 oz (76.6 kg)   SpO2 95%   BMI 28.97 kg/m  Wt Readings from Last 3 Encounters:  07/01/16 168 lb 12.8 oz (76.6 kg)  06/17/16 180 lb 12.4 oz (82 kg)  06/16/16 170 lb (77.1 kg)     Lab Results  Component Value Date   WBC 9.1 06/28/2016   HGB 10.5 (L) 06/28/2016   HCT 31.8 (L) 06/28/2016   PLT 287.0 06/28/2016   GLUCOSE 84 06/28/2016   CHOL 115 06/28/2016   TRIG 72.0 06/28/2016   HDL 60.90 06/28/2016   LDLDIRECT 73.7 08/20/2013   LDLCALC 40 06/28/2016   ALT 29 06/28/2016   AST 22 06/28/2016   NA 139 06/28/2016   K 4.2 06/28/2016   CL 99 06/28/2016   CREATININE 0.93 06/28/2016   BUN 10 06/28/2016   CO2 32 06/28/2016   TSH 1.18 12/18/2015   INR 1.05 06/16/2016    Ct Head Wo Contrast  Result Date: 06/19/2016 CLINICAL DATA:  Headache and dizziness EXAM: CT HEAD WITHOUT CONTRAST TECHNIQUE: Contiguous axial images were obtained from the base of the skull through the vertex without intravenous contrast. COMPARISON:  CT head 06/07/2015 FINDINGS: Brain: Generalized atrophy is unchanged. Chronic microvascular ischemic change in the white matter stable. Negative for acute infarct.  Negative for acute hemorrhage or mass. Vascular: No hyperdense vessel or unexpected calcification. Skull:  Negative Sinuses/Orbits: Negative Other:  None IMPRESSION: No acute intracranial abnormality. Atrophy and chronic microvascular ischemic change, stable since the prior study. Electronically Signed   By: Franchot Gallo M.D.   On: 06/19/2016 15:14   Mr Brain Wo Contrast  Result Date: 06/20/2016 CLINICAL DATA:  75 year old female with generalized weakness and fever. 102.3 Fahrenheit. Nausea vomiting, productive cough. Initial encounter. EXAM: MRI HEAD WITHOUT CONTRAST TECHNIQUE: Multiplanar, multiecho pulse sequences of the brain and surrounding structures were obtained without intravenous contrast. COMPARISON:  Head CT without contrast 06/19/2016, brain MRI without and with contrast 09/21/2010. FINDINGS: Brain: No restricted diffusion to suggest acute infarction. No midline shift, mass effect, evidence of mass lesion, ventriculomegaly, extra-axial collection or acute intracranial hemorrhage. Cervicomedullary junction and pituitary are within normal limits. Mild progression of patchy in scattered cerebral white matter T2 and FLAIR hyperintensity since 2012. The extent is now mild to moderate for age. The configuration is nonspecific, there is some temporal lobe involvement on the left. No cortical edema or cortical encephalomalacia identified. Insula and mesial temporal lobe structures appear normal. 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 Other deep gray matter nuclei appear normal. Negative brainstem and cerebellum. Vascular: Major intracranial vascular flow voids are stable since 2012, with mild intracranial artery dolichoectasia. Skull and upper cervical spine: Negative. Normal bone marrow signal. Sinuses/Orbits: Postoperative changes to both globes, otherwise negative orbits soft tissues. Trace paranasal sinus mucosal thickening. Trace mastoid fluid more so on the left. Negative nasopharynx. Other: Visible internal auditory structures appear normal. Negative scalp soft tissues. IMPRESSION: 1.  No acute intracranial  abnormality. 2. Mild to moderate for age nonspecific white matter signal changes in the brain, most commonly due to chronic small vessel disease, with some progression since 2012 Electronically Signed   By: Genevie Ann M.D.   On: 06/20/2016 14:44   Ct Abdomen Pelvis W Contrast  Result Date: 06/19/2016 CLINICAL DATA:  Headache, dizziness, nausea, and vomiting beginning yesterday, history cervical cancer, cholecystectomy, hysterectomy, rheumatoid arthritis, prior diverticulitis, hypertension EXAM: CT ABDOMEN AND PELVIS WITH CONTRAST TECHNIQUE: Multidetector CT imaging of the abdomen and pelvis was performed using the standard protocol following bolus administration of intravenous contrast. Sagittal and coronal MPR images reconstructed from axial data set. CONTRAST:  144mL ISOVUE-300 IOPAMIDOL (ISOVUE-300) INJECTION 61% IV. Dilute oral contrast. COMPARISON:  08/25/2014 FINDINGS: Lower chest: Subsegmental atelectasis BILATERAL lower lobes greater on LEFT. Patchy infiltrates RIGHT middle lobe and RIGHT lower lobe. Tiny BILATERAL pleural effusions. Hepatobiliary: Gallbladder surgically absent. Mild fatty infiltration of liver. No biliary dilatation. 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 Spleen otherwise normal appearance Stomach/Bowel: Adrenal glands normal appearance. Prominent extrarenal pelves bilaterally. Mildly prominent RIGHT ureter. No ureteral calcification. Bladder unremarkable. Vascular/Lymphatic: Appendix not visualized, but no pericecal inflammatory process seen. Contrast present to rectum. Few scattered sigmoid diverticula. Colon under distended, no definite wall thickening within this limitation. Gastric antrum underdistended without evidence of outlet obstruction, unable to accurately assess at antral wall thickness in this setting. Small bowel loops unremarkable.  Reproductive: Scattered atherosclerotic calcifications aorta without aneurysm. Coronary arterial calcifications noted. No adenopathy. Other: No free air or free fluid. Musculoskeletal: Osseous demineralization. Degenerative disc disease changes lumbar spine. IMPRESSION: Patchy RIGHT basilar pulmonary infiltrates. Fatty infiltration of liver. 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 Minimal sigmoid diverticulosis without evidence of diverticulitis. No acute intra-abdominal or intrapelvic abnormalities. Aortic atherosclerosis and coronary arterial calcification. Electronically Signed  By: Lavonia Dana M.D.   On: 06/19/2016 16:11       Assessment & Plan:   Problem List Items Addressed This Visit    A-fib [facility])    Found in hospital.  Evaluated by cardiology.  Felt to be related to pneumonia.  On metoprolol.  Follow.  Currently in SR.       Relevant Medications   metoprolol (LOPRESSOR) 50 MG tablet   CAP (community acquired pneumonia)    Recently admitted with pneumonia.  Treated with abx and steroids as outlined.  Will need f/u cxr.  Currently breathing better.  Feels better.  Follow.       Relevant Medications   cetirizine (ZYRTEC) 10 MG tablet   GERD (gastroesophageal reflux disease)    Controlled on current regimen.  Follow.       Hypertension    Blood pressure has been under good control.  Slight elevation today.  Continue same medication regimen.  Follow pressures.  Follow metabolic panel.        Relevant Medications   metoprolol (LOPRESSOR) 50 MG tablet   Hypoxia    Has previously required nocturnal oxygen.  Using o2 at night.  Follow.        Pain in limb - Primary    Pain in her right foot as outlined.  Will xray.        Relevant Orders   DG Foot 2 Views Right (Completed)   Rheumatoid arthritis ([place])    Followed by Dr [name].  Recently treated with prednisone taper - for her right foot. Some improvement.  Still with pain.  Will xray foot.  Continue f/u with  Dr [name].            [name], MD

## 2016-07-01 NOTE — Progress Notes (Signed)
Pre visit review using our clinic review tool, if applicable. No additional management support is needed unless otherwise documented below in the visit note. 

## 2016-07-06 ENCOUNTER — Telehealth: Payer: Self-pay | Admitting: Internal Medicine

## 2016-07-06 DIAGNOSIS — S92321A Displaced fracture of second metatarsal bone, right foot, initial encounter for closed fracture: Secondary | ICD-10-CM | POA: Diagnosis not present

## 2016-07-06 DIAGNOSIS — M79671 Pain in right foot: Secondary | ICD-10-CM | POA: Diagnosis not present

## 2016-07-06 NOTE — Telephone Encounter (Signed)
Pt daughter called wanting to know if her Xrays from 07/01/2016 if they can be sent to Dr Elvina Mattes at [facility]. Thank you!

## 2016-07-06 NOTE — Telephone Encounter (Signed)
X ray report faxed to Dr. Elvina Mattes

## 2016-07-07 ENCOUNTER — Telehealth: Payer: Self-pay | Admitting: Internal Medicine

## 2016-07-07 ENCOUNTER — Other Ambulatory Visit: Payer: Self-pay | Admitting: Internal Medicine

## 2016-07-07 NOTE — Telephone Encounter (Signed)
Spoke with patient and she is going to bring the form over that tells the details about the oxygen therapy.

## 2016-07-07 NOTE — Telephone Encounter (Signed)
FYI

## 2016-07-07 NOTE — Telephone Encounter (Signed)
Pt called to inform Dr. Nicki Reaper that was went to her podiatrist and has a broken metatarsal and that she is in a cast for 3 weeks.   Pt also wanted to know if Dr. Nicki Reaper got in touch with the oxygen company. She is using her oxygen. Thank you!

## 2016-07-07 NOTE — Telephone Encounter (Signed)
Thank her for the update on her foot.  We referred her to podiatry for fracture.  As far as her oxygen, what company do we need to get in touch with and what exactly do we need to do?  We did not initiate her oxygen therapy (to my knowledge) so I am not sure that we have this information.  This may have been started in the hospital.

## 2016-07-08 NOTE — Telephone Encounter (Signed)
FYI. Patient dropped off form and I have attached it to paperwork for her appointment on 07/29/16.

## 2016-07-12 ENCOUNTER — Encounter: Payer: Self-pay | Admitting: Internal Medicine

## 2016-07-12 DIAGNOSIS — I4891 Unspecified atrial fibrillation: Secondary | ICD-10-CM | POA: Insufficient documentation

## 2016-07-12 DIAGNOSIS — I480 Paroxysmal atrial fibrillation: Secondary | ICD-10-CM | POA: Insufficient documentation

## 2016-07-12 NOTE — Assessment & Plan Note (Signed)
Found in hospital.  Evaluated by cardiology.  Felt to be related to pneumonia.  On metoprolol.  Follow.  Currently in SR.

## 2016-07-12 NOTE — Assessment & Plan Note (Signed)
Followed by Dr [name].  Recently treated with prednisone taper - for her right foot. Some improvement.  Still with pain.  Will xray foot.  Continue f/u with Dr [name].

## 2016-07-12 NOTE — Assessment & Plan Note (Signed)
Blood pressure has been under good control.  Slight elevation today.  Continue same medication regimen.  Follow pressures.  Follow metabolic panel.

## 2016-07-12 NOTE — Assessment & Plan Note (Signed)
Controlled on current regimen.  Follow.  

## 2016-07-12 NOTE — Assessment & Plan Note (Signed)
Recently admitted with pneumonia.  Treated with abx and steroids as outlined.  Will need f/u cxr.  Currently breathing better.  Feels better.  Follow.

## 2016-07-12 NOTE — Assessment & Plan Note (Signed)
Has previously required nocturnal oxygen.  Using o2 at night.  Follow.

## 2016-07-12 NOTE — Assessment & Plan Note (Signed)
Pain in her right foot as outlined.  Will xray.

## 2016-07-19 ENCOUNTER — Telehealth (INDEPENDENT_AMBULATORY_CARE_PROVIDER_SITE_OTHER): Payer: Self-pay | Admitting: Vascular Surgery

## 2016-07-19 ENCOUNTER — Telehealth: Payer: Self-pay | Admitting: Internal Medicine

## 2016-07-19 ENCOUNTER — Other Ambulatory Visit: Payer: Self-pay

## 2016-07-19 MED ORDER — METOPROLOL TARTRATE 50 MG PO TABS: 50.0000 mg | ORAL_TABLET | Freq: Two times a day (BID) | ORAL | 0 refills | Status: DC

## 2016-07-19 MED ORDER — METOPROLOL TARTRATE 50 MG PO TABS: 50.0000 mg | ORAL_TABLET | Freq: Two times a day (BID) | ORAL | 1 refills | Status: DC

## 2016-07-19 NOTE — Telephone Encounter (Signed)
Pt called requesting a refill on metoprolol (LOPRESSOR) 50 MG tablet (she was given this at the hospital). She needs a 30 day refill at her local pharmacy and a 90 day supply to her mail away.  Pharmacy ([place] day) - [place], [place] (mail -93 day) [place], [place]

## 2016-07-19 NOTE — Telephone Encounter (Signed)
DONE

## 2016-07-19 NOTE — Telephone Encounter (Signed)
Patient called and stated that she broke her foot and doesn't know whether to come for her follow-up laser appt since it is the same leg (right) and she has a cast up to her knee. Said she is experiencing some pain and a little discoloration around her toe. [phone]

## 2016-07-21 ENCOUNTER — Encounter (INDEPENDENT_AMBULATORY_CARE_PROVIDER_SITE_OTHER): Payer: Self-pay | Admitting: Vascular Surgery

## 2016-07-21 ENCOUNTER — Ambulatory Visit (INDEPENDENT_AMBULATORY_CARE_PROVIDER_SITE_OTHER): Payer: Medicare Other | Admitting: Vascular Surgery

## 2016-07-21 VITALS — BP 144/71 | HR 57 | Resp 16 | Ht 66.0 in | Wt 162.0 lb

## 2016-07-21 DIAGNOSIS — I872 Venous insufficiency (chronic) (peripheral): Secondary | ICD-10-CM

## 2016-07-21 DIAGNOSIS — K219 Gastro-esophageal reflux disease without esophagitis: Secondary | ICD-10-CM

## 2016-07-21 DIAGNOSIS — I4891 Unspecified atrial fibrillation: Secondary | ICD-10-CM

## 2016-07-21 DIAGNOSIS — E7800 Pure hypercholesterolemia, unspecified: Secondary | ICD-10-CM

## 2016-07-21 DIAGNOSIS — I8311 Varicose veins of right lower extremity with inflammation: Secondary | ICD-10-CM | POA: Diagnosis not present

## 2016-07-21 DIAGNOSIS — I8312 Varicose veins of left lower extremity with inflammation: Secondary | ICD-10-CM | POA: Diagnosis not present

## 2016-07-24 NOTE — Progress Notes (Signed)
MRN : [id number]  Kaitlyn Huff is a 76 y.o. (July 15, 1940) female who presents with chief complaint of  Chief Complaint  Patient presents with  . Varicose Veins    3-4 week laser follow up  .  History of Present Illness: The patient returns to the office for followup status post laser ablation of the left small and great saphenous vein on 03/31/2016. The patient notes multiple residual varicosities bilaterally which continued to hurt with dependent positions and remained tender to palpation. The patient's swelling is unchanged from preoperative status. The patient continues to wear graduated compression stockings on a daily basis but these are not eliminating the pain and discomfort. The patient continues to use over-the-counter anti-inflammatory medications to treat the pain and related symptoms but this has not given the patient relief. The patient notes the pain in the lower extremities is causing problems with daily exercise, problems at work and even with household activities such as preparing meals and doing dishes.  The patient is otherwise done well and there have been no complications related to the laser procedure or interval changes in the patient's overall   Venous ultrasound post laser shows successful laser ablation of the small saphenous vein, no DVT identified.  Current Meds  Medication Sig  . acidophilus (RISAQUAD) CAPS capsule Take 1 capsule by mouth daily.  Marland Kitchen ADVAIR DISKUS 250-50 MCG/DOSE AEPB USE 1 INHALATION EVERY 12 HOURS  . albuterol (PROVENTIL) (2.5 MG/3ML) 0.083% nebulizer solution Take 2.5 mg by nebulization every 6 (six) hours as needed for wheezing or shortness of breath.  Marland Kitchen alendronate (FOSAMAX) 70 MG tablet Take 70 mg by mouth once a week.   . ALPRAZolam (XANAX) 0.25 MG tablet Take 1 tablet (0.25 mg total) by mouth daily as needed for anxiety.  Marland Kitchen aspirin EC 81 MG tablet Take 81 mg by mouth daily.  . Calcium Carbonate-Vitamin D (CALCIUM 600+D) 600-400 MG-UNIT  tablet Take 1 tablet by mouth 2 (two) times daily.  . cefUROXime (CEFTIN) 500 MG tablet Take 1 tablet (500 mg total) by mouth 2 (two) times daily with a meal.  . cetirizine (ZYRTEC) 10 MG tablet Take 1 tablet (10 mg total) by mouth daily.  . cholecalciferol (VITAMIN D) 1000 units tablet Take 1,000 Units by mouth daily.  . clonazePAM (KLONOPIN) 0.5 MG tablet Take 0.5 mg by mouth 2 (two) times daily as needed for anxiety.  . colestipol (COLESTID) 1 g tablet Take 2 g by mouth 2 (two) times daily.  . Diphenhyd-Hydrocort-Nystatin (FIRST-DUKES MOUTHWASH) SUSP Pt is to swish and spit 5 cc's tid  . FeFum-FePo-FA-B Cmp-C-Zn-Mn-Cu (TANDEM PLUS) 162-115.2-1 MG CAPS Take 1 capsule by mouth daily.  . fluticasone (FLONASE) 50 MCG/ACT nasal spray Place 2 sprays into both nostrils daily.  . furosemide (LASIX) 20 MG tablet Take 1 tablet (20 mg total) by mouth daily.  . furosemide (LASIX) 40 MG tablet   . gabapentin (NEURONTIN) 300 MG capsule Take 300 mg by mouth 4 (four) times daily.  Marland Kitchen guaiFENesin-dextromethorphan (ROBITUSSIN DM) 100-10 MG/5ML syrup Take 5 mLs by mouth every 4 (four) hours as needed for cough.  . hydroxychloroquine (PLAQUENIL) 200 MG tablet Take 1 tablet (200 mg total) by mouth daily.  Marland Kitchen KLOR-CON SPRINKLE 10 MEQ CR capsule TAKE 2 CAPSULES TWICE A DAY  . lip balm (BLISTEX) OINT Apply 1 application topically as needed for lip care.  . meloxicam (MOBIC) 15 MG tablet Take 15 mg by mouth daily as needed for pain.  . metoprolol (LOPRESSOR) 50  MG tablet Take 1 tablet (50 mg total) by mouth 2 (two) times daily.  . Multiple Vitamin (MULTIVITAMIN WITH MINERALS) TABS tablet Take 1 tablet by mouth daily.  Marland Kitchen nystatin (MYCOSTATIN) 100000 UNIT/ML suspension Take 5 mLs (500,000 Units total) by mouth 4 (four) times daily.  Marland Kitchen nystatin cream (MYCOSTATIN) APPLY 1 APPLICATION TOPICALLY TO THE AFFECTED AREA (OUTER VAGINA) TWICE A DAY  . ondansetron (ZOFRAN ODT) 4 MG disintegrating tablet Take 1 tablet (4 mg total)  by mouth every 8 (eight) hours as needed for nausea or vomiting.  . pramipexole (MIRAPEX) 0.25 MG tablet TAKE 2 TABLETS TWICE A DAY  . predniSONE (DELTASONE) 5 MG tablet Take 5 tabs po day 1; take 4 tabs po day2; 3 tabs po day3; take 2 tabs po day2; take one tab daily afterwards  . PROAIR HFA 108 (90 Base) MCG/ACT inhaler USE 2 INHALATIONS EVERY 4 HOURS AS NEEDED FOR WHEEZING OR SHORTNESS OF BREATH  . RABEprazole (ACIPHEX) 20 MG tablet Take 1 tablet (20 mg total) by mouth 2 (two) times daily before a meal.  . simvastatin (ZOCOR) 20 MG tablet Take 20 mg by mouth daily.   . sodium chloride (OCEAN) 0.65 % nasal spray Place 1 spray into the nose as needed for congestion.  Marland Kitchen tiotropium (SPIRIVA) 18 MCG inhalation capsule Place 1 capsule (18 mcg total) into inhaler and inhale daily.  . TOPROL XL 50 MG 24 hr tablet   . traMADol (ULTRAM) 50 MG tablet Take 50 mg by mouth 2 (two) times daily as needed for moderate pain.    Past Medical History:  Diagnosis Date  . Anemia   . Cancer ([place])   . Diverticulitis   . GERD (gastroesophageal reflux disease)   . Hyperlipidemia   . Hypertension   . Osteoarthritis   . Osteoporosis    actonel  . Positive PPD    s/p INH (2006)  . Rheumatoid arthritis(714.0)    MTX transaminitis, Leflunomide (rash), enbrel, plaquinil, prednisone, remicade, Imuran (transaminitis)  . Valvular heart disease    moderate MR and TR    Past Surgical History:  Procedure Laterality Date  . ABDOMINAL HYSTERECTOMY    . CERVICAL CONE BIOPSY    . CHOLECYSTECTOMY  06/22/14  . COLONOSCOPY WITH PROPOFOL N/A 07/09/2015   Procedure: COLONOSCOPY WITH PROPOFOL;  Surgeon: Manya Silvas, MD;  Location: [facility] ENDOSCOPY;  Service: Endoscopy;  Laterality: N/A;  . ESOPHAGOGASTRODUODENOSCOPY (EGD) WITH PROPOFOL N/A 07/09/2015   Procedure: ESOPHAGOGASTRODUODENOSCOPY (EGD) WITH PROPOFOL;  Surgeon: Manya Silvas, MD;  Location: [facility] ENDOSCOPY;  Service: Endoscopy;  Laterality: N/A;  . OVARY  SURGERY    . TRACHEOSTOMY  1959  . TUBAL LIGATION      Social History Social History  Substance Use Topics  . Smoking status: Never Smoker  . Smokeless tobacco: Never Used  . Alcohol use No    Family History Family History  Problem Relation Age of Onset  . Heart disease Father     MI  . Heart disease Mother   . Valvular heart disease Mother   . Breast cancer Sister 40  . Cancer Sister     Lung cancer  . Colon cancer Neg Hx   No family history of bleeding/clotting disorders, porphyria or autoimmune disease   Allergies  Allergen Reactions  . Astelin [Azelastine Hcl] Other (See Comments)    Reaction:  Unknown   . Ciprofloxacin Other (See Comments)    Reaction:  GI upset   . Codeine Other (See Comments)  Reaction:  Altered mental status  . Dilaudid [Hydromorphone] Other (See Comments)    Reaction:  Unknown   . Flexeril [Cyclobenzaprine] Other (See Comments)    Reaction:  Unknown   . Imuran [Azathioprine] Other (See Comments)    Reaction:  Abnormal liver function  . Keflex [Cephalexin] Other (See Comments)    Reaction:  GI upset   . Lisinopril Itching  . Lyrica [Pregabalin] Other (See Comments)    Reaction:  Sore gums   . Methotrexate Derivatives Other (See Comments)    Reaction:  Abnormal liver function  . Amoxicillin Rash and Other (See Comments)    Unable to obtain enough information to answer additional questions about this medication.    Jolee Ewing [Leflunomide] Rash  . Clindamycin/Lincomycin Rash  . Doxycycline Rash  . Lodine [Etodolac] Rash  . Percocet [Oxycodone-Acetaminophen] Rash  . Sulfa Antibiotics Rash     REVIEW OF SYSTEMS (Negative unless checked)  Constitutional: [] Weight loss  [] Fever  [] Chills Cardiac: [] Chest pain   [] Chest pressure   [] Palpitations   [] Shortness of breath when laying flat   [] Shortness of breath with exertion. Vascular:  [] Pain in legs with walking   [x] Pain in legs with standing  [] History of DVT   [] Phlebitis    [x] Swelling in legs   [x] Varicose veins   [] Non-healing ulcers Pulmonary:   [] Uses home oxygen   [] Productive cough   [] Hemoptysis   [] Wheeze  [] COPD   [] Asthma Neurologic:  [] Dizziness   [] Seizures   [] History of stroke   [] History of TIA  [] Aphasia   [] Vissual changes   [] Weakness or numbness in arm   [] Weakness or numbness in leg Musculoskeletal:   [] Joint swelling   [] Joint pain   [] Low back pain Hematologic:  [] Easy bruising  [] Easy bleeding   [] Hypercoagulable state   [] Anemic Gastrointestinal:  [] Diarrhea   [] Vomiting  [] Gastroesophageal reflux/heartburn   [] Difficulty swallowing. Genitourinary:  [] Chronic kidney disease   [] Difficult urination  [] Frequent urination   [] Blood in urine Skin:  [] Rashes   [] Ulcers  Psychological:  [] History of anxiety   []  History of major depression.  Physical Examination  Vitals:   07/21/16 1426  BP: (!) 144/71  Pulse: (!) 57  Resp: 16  Weight: 162 lb (73.5 kg)  Height: 5\' 6"  (1.676 m)   Body mass index is 26.15 kg/m. Gen: WD/WN, NAD Head: [place]/AT, No temporalis wasting.  Ear/Nose/Throat: Hearing grossly intact, nares w/o erythema or drainage, poor dentition Eyes: PER, EOMI, sclera nonicteric.  Neck: Supple, no masses.  No bruit or JVD.  Pulmonary:  Good air movement, clear to auscultation bilaterally, no use of accessory muscles.  Cardiac: RRR, normal S1, S2, no Murmurs. Vascular: diffuse varicosities left leg > right Vessel Right Left  Radial Palpable Palpable  Ulnar Palpable Palpable  Brachial Palpable Palpable  Carotid Palpable Palpable  Femoral Palpable Palpable  Popliteal Palpable Palpable  PT Palpable Palpable  DP Palpable Palpable   Gastrointestinal: soft, non-distended. No guarding/no peritoneal signs.  Musculoskeletal: M/S 5/5 throughout.  No deformity or atrophy.  Neurologic: CN 2-12 intact. Pain and light touch intact in extremities.  Symmetrical.  Speech is fluent. Motor exam as listed above. Psychiatric: Judgment intact,  Mood & affect appropriate for pt's clinical situation. Dermatologic: No rashes or ulcers noted.  No changes consistent with cellulitis. Lymph : No Cervical lymphadenopathy, no lichenification or skin changes of chronic lymphedema.  CBC Lab Results  Component Value Date   WBC 9.1 06/28/2016   HGB 10.5 (L) 06/28/2016  HCT 31.8 (L) 06/28/2016   MCV 94.1 06/28/2016   PLT 287.0 06/28/2016    BMET    Component Value Date/Time   NA 139 06/28/2016 0902   NA 140 11/03/2014 0441   K 4.2 06/28/2016 0902   K 3.7 11/03/2014 0441   CL 99 06/28/2016 0902   CL 108 11/03/2014 0441   CO2 32 06/28/2016 0902   CO2 23 11/03/2014 0441   GLUCOSE 84 06/28/2016 0902   GLUCOSE 133 (H) 11/03/2014 0441   BUN 10 06/28/2016 0902   BUN 16 11/03/2014 0441   CREATININE 0.93 06/28/2016 0902   CREATININE 1.35 (H) 12/18/2015 1603   CALCIUM 9.5 06/28/2016 0902   CALCIUM 7.6 (L) 11/03/2014 0441   GFRNONAA >60 06/21/2016 0550   GFRNONAA >60 11/03/2014 0441   GFRAA >60 06/21/2016 0550   GFRAA >60 11/03/2014 0441   CrCl cannot be calculated (Patient's most recent lab result is older than the maximum 21 days allowed.).  COAG Lab Results  Component Value Date   INR 1.05 06/16/2016   INR 1.07 08/03/2015   INR 1.0 11/01/2014    Radiology Dg Foot 2 Views Right  Result Date: 07/01/2016 CLINICAL DATA:  Foot pain and swelling. EXAM: RIGHT FOOT - 2 VIEW COMPARISON:  None. FINDINGS: There is a transverse comminuted fracture of the midshaft of the second metatarsal bone with mild lateral displacement of the distal fracture fragment and medial angulation. There is an associated soft tissue swelling. Palmar calcaneal spurs noted. Osteoarthritic changes at the first metatarsophalangeal joint are seen. IMPRESSION: Transverse comminuted mildly displaced fracture of the midshaft of the second metatarsal bone. Associated soft tissue swelling. Electronically Signed   By: Fidela Salisbury M.D.   On: 07/01/2016 16:34     Assessment/Plan 1. Varicose veins of both lower extremities with inflammation Recommend:  The patient has had successful ablation of the previously incompetent saphenous venous system but still has persistent symptoms of pain and swelling that are having a negative impact on daily life and daily activities.  Patient should undergo injection sclerotherapy to treat the residual varicosities.  The risks, benefits and alternative therapies were reviewed in detail with the patient.  All questions were answered.  The patient will consider this and follow up if she wishes to proceed with sclerotherapy.  The patient will continue wearing the graduated compression stockings and using the over-the-counter pain medications to treat her symptoms.    2. Chronic venous insufficiency No surgery or intervention at this point in time.    I have had a long discussion with the patient regarding venous insufficiency and why it  causes symptoms. I have discussed with the patient the chronic skin changes that accompany venous insufficiency and the long term sequela such as infection and ulceration.  Patient will begin wearing graduated compression stockings class 1 (20-30 mmHg) or compression wraps on a daily basis a prescription was given. The patient will put the stockings on first thing in the morning and removing them in the evening. The patient is instructed specifically not to sleep in the stockings.    In addition, behavioral modification including several periods of elevation of the lower extremities during the day will be continued. I have demonstrated that proper elevation is a position with the ankles at heart level.  The patient is instructed to begin routine exercise, especially walking on a daily basis  Patient should undergo duplex ultrasound of the venous system to ensure that DVT or reflux is not present.  Following the  review of the ultrasound the patient will follow up in 2-3 months to  reassess the degree of swelling and the control that graduated compression stockings or compression wraps  is offering.   The patient can be assessed for a Lymph Pump at that time  3. Hypercholesterolemia Continue PPI medications as already ordered, these medications have been reviewed and there are no changes at this time.  4. Gastroesophageal reflux disease, esophagitis presence not specified Continue antihypertensive medications as already ordered, these medications have been reviewed and there are no changes at this time.  5. Atrial fibrillation, unspecified type ([place]) Continue cardiac and antiarrhythmia medications as already ordered and reviewed, no changes at this time.  Continue statin as ordered and reviewed, no changes at this time   Hortencia Pilar, MD  07/24/2016 4:00 PM

## 2016-07-29 ENCOUNTER — Ambulatory Visit (INDEPENDENT_AMBULATORY_CARE_PROVIDER_SITE_OTHER): Payer: Medicare Other | Admitting: Internal Medicine

## 2016-07-29 ENCOUNTER — Encounter: Payer: Self-pay | Admitting: Internal Medicine

## 2016-07-29 ENCOUNTER — Ambulatory Visit (INDEPENDENT_AMBULATORY_CARE_PROVIDER_SITE_OTHER): Payer: Medicare Other

## 2016-07-29 VITALS — BP 134/68 | HR 57 | Temp 97.6°F | Ht 66.0 in | Wt 165.8 lb

## 2016-07-29 DIAGNOSIS — R938 Abnormal findings on diagnostic imaging of other specified body structures: Secondary | ICD-10-CM

## 2016-07-29 DIAGNOSIS — D649 Anemia, unspecified: Secondary | ICD-10-CM

## 2016-07-29 DIAGNOSIS — I10 Essential (primary) hypertension: Secondary | ICD-10-CM

## 2016-07-29 DIAGNOSIS — R0902 Hypoxemia: Secondary | ICD-10-CM | POA: Diagnosis not present

## 2016-07-29 DIAGNOSIS — M79672 Pain in left foot: Secondary | ICD-10-CM | POA: Diagnosis not present

## 2016-07-29 DIAGNOSIS — R05 Cough: Secondary | ICD-10-CM

## 2016-07-29 DIAGNOSIS — I4891 Unspecified atrial fibrillation: Secondary | ICD-10-CM | POA: Diagnosis not present

## 2016-07-29 DIAGNOSIS — J189 Pneumonia, unspecified organism: Secondary | ICD-10-CM

## 2016-07-29 DIAGNOSIS — R197 Diarrhea, unspecified: Secondary | ICD-10-CM

## 2016-07-29 DIAGNOSIS — S92902A Unspecified fracture of left foot, initial encounter for closed fracture: Secondary | ICD-10-CM | POA: Diagnosis not present

## 2016-07-29 DIAGNOSIS — I517 Cardiomegaly: Secondary | ICD-10-CM | POA: Diagnosis not present

## 2016-07-29 DIAGNOSIS — K219 Gastro-esophageal reflux disease without esophagitis: Secondary | ICD-10-CM | POA: Diagnosis not present

## 2016-07-29 DIAGNOSIS — R9389 Abnormal findings on diagnostic imaging of other specified body structures: Secondary | ICD-10-CM

## 2016-07-29 DIAGNOSIS — J181 Lobar pneumonia, unspecified organism: Secondary | ICD-10-CM

## 2016-07-29 DIAGNOSIS — M069 Rheumatoid arthritis, unspecified: Secondary | ICD-10-CM

## 2016-07-29 DIAGNOSIS — E7800 Pure hypercholesterolemia, unspecified: Secondary | ICD-10-CM

## 2016-07-29 DIAGNOSIS — M79671 Pain in right foot: Secondary | ICD-10-CM | POA: Diagnosis not present

## 2016-07-29 DIAGNOSIS — R059 Cough, unspecified: Secondary | ICD-10-CM

## 2016-07-29 LAB — CBC WITH DIFFERENTIAL/PLATELET
BASOS PCT: 0 %
Basophils Absolute: 0 cells/uL (ref 0–200)
EOS PCT: 1 %
Eosinophils Absolute: 74 cells/uL (ref 15–500)
HCT: 33.7 % — ABNORMAL LOW (ref 35.0–45.0)
Hemoglobin: 11.3 g/dL — ABNORMAL LOW (ref 11.7–15.5)
LYMPHS PCT: 28 %
Lymphs Abs: 2072 cells/uL (ref 850–3900)
MCH: 30.5 pg (ref 27.0–33.0)
MCHC: 33.5 g/dL (ref 32.0–36.0)
MCV: 91.1 fL (ref 80.0–100.0)
MONO ABS: 518 {cells}/uL (ref 200–950)
MONOS PCT: 7 %
MPV: 10.1 fL (ref 7.5–12.5)
NEUTROS PCT: 64 %
Neutro Abs: 4736 cells/uL (ref 1500–7800)
PLATELETS: 238 10*3/uL (ref 140–400)
RBC: 3.7 MIL/uL — ABNORMAL LOW (ref 3.80–5.10)
RDW: 13.4 % (ref 11.0–15.0)
WBC: 7.4 10*3/uL (ref 3.8–10.8)

## 2016-07-29 LAB — FERRITIN: Ferritin: 188 ng/mL (ref 20–288)

## 2016-07-29 NOTE — Progress Notes (Signed)
Pre visit review using our clinic review tool, if applicable. No additional management support is needed unless otherwise documented below in the visit note. 

## 2016-07-29 NOTE — Progress Notes (Signed)
Patient ID: DYLYNN MORONEY, female   DOB: 10-17-1940, 76 y.o.   MRN: [id number]   Subjective:    Patient ID: [name], female    DOB: Nov 18, 1940, 76 y.o.   MRN: [id number]  HPI  Patient here for a scheduled follow up.  She is accompanied by her daughter.  History obtained from both of them.  Her breathing is better.  Still having some minimal cough.  No abdominal pain.  Still having issues with her bowels.  Taking align.  Diarrhea has slowed down some.  Still intermittent flares.  No blood.  Has f/u with GI, but request an earlier follow up.  She is seeing podiatry for f/u of foot fracture.  Has a boot on now.  Will be able to ambulate better.     Past Medical History:  Diagnosis Date  . Anemia   . Cancer ([place])   . Diverticulitis   . GERD (gastroesophageal reflux disease)   . Hyperlipidemia   . Hypertension   . Osteoarthritis   . Osteoporosis    actonel  . Positive PPD    s/p INH (2006)  . Rheumatoid arthritis(714.0)    MTX transaminitis, Leflunomide (rash), enbrel, plaquinil, prednisone, remicade, Imuran (transaminitis)  . 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 ABDOMINAL HYSTERECTOMY    . CERVICAL CONE BIOPSY    . CHOLECYSTECTOMY  06/22/14  . COLONOSCOPY WITH PROPOFOL N/A 07/09/2015   Procedure: COLONOSCOPY WITH PROPOFOL;  Surgeon: Manya Silvas, MD;  Location: [facility] ENDOSCOPY;  Service: Endoscopy;  Laterality: N/A;  . ESOPHAGOGASTRODUODENOSCOPY (EGD) WITH PROPOFOL N/A 07/09/2015   Procedure: ESOPHAGOGASTRODUODENOSCOPY (EGD) WITH PROPOFOL;  Surgeon: Manya Silvas, MD;  Location: [facility] ENDOSCOPY;  Service: Endoscopy;  Laterality: N/A;  . OVARY SURGERY    . TRACHEOSTOMY  1959  . TUBAL LIGATION     Family History  Problem Relation Age of Onset  . Heart disease Father     MI  . Heart disease Mother   . Valvular heart disease Mother   . Breast cancer Sister 36  . Cancer Sister     Lung cancer  . Colon  cancer Neg Hx    Social History   Social History  . Marital status: Widowed    Spouse name: N/A  . Number of children: 4  . Years of education: N/A   Occupational History  . retired    Social History Main Topics  . Smoking status: Never Smoker  . Smokeless tobacco: Never Used  . Alcohol use No  . Drug use: No  . Sexual activity: No   Other Topics Concern  . None   Social History Narrative   Lives with family at home    Outpatient Encounter Prescriptions as of 07/29/2016  Medication Sig  . acidophilus (RISAQUAD) CAPS capsule Take 1 capsule by mouth daily.  Marland Kitchen ADVAIR DISKUS 250-50 MCG/DOSE AEPB USE 1 INHALATION EVERY 12 HOURS  . albuterol (PROVENTIL) (2.5 MG/3ML) 0.083% nebulizer solution Take 2.5 mg by nebulization every 6 (six) hours as needed for wheezing or shortness of breath.  Marland Kitchen alendronate (FOSAMAX) 70 MG tablet Take 70 mg by mouth once a week.   . ALPRAZolam (XANAX) 0.25 MG tablet Take 1 tablet (0.25 mg total) by mouth daily as needed for anxiety.  Marland Kitchen aspirin EC 81 MG tablet Take 81 mg by mouth daily.  . Calcium Carbonate-Vitamin D (CALCIUM 600+D) 600-400 MG-UNIT tablet Take 1  tablet by mouth 2 (two) times daily.  . cetirizine (ZYRTEC) 10 MG tablet Take 1 tablet (10 mg total) by mouth daily.  . cholecalciferol (VITAMIN D) 1000 units tablet Take 1,000 Units by mouth daily.  . clonazePAM (KLONOPIN) 0.5 MG tablet Take 0.5 mg by mouth 2 (two) times daily as needed for anxiety.  . colestipol (COLESTID) 1 g tablet Take 2 g by mouth 2 (two) times daily.  . Diphenhyd-Hydrocort-Nystatin (FIRST-DUKES MOUTHWASH) SUSP Pt is to swish and spit 5 cc's tid  . FeFum-FePo-FA-B Cmp-C-Zn-Mn-Cu (TANDEM PLUS) 162-115.2-1 MG CAPS Take 1 capsule by mouth daily.  . fluticasone (FLONASE) 50 MCG/ACT nasal spray Place 2 sprays into both nostrils daily.  . furosemide (LASIX) 20 MG tablet Take 1 tablet (20 mg total) by mouth daily.  . furosemide (LASIX) 40 MG tablet   . gabapentin (NEURONTIN) 300  MG capsule Take 300 mg by mouth 4 (four) times daily.  Marland Kitchen guaiFENesin-dextromethorphan (ROBITUSSIN DM) 100-10 MG/5ML syrup Take 5 mLs by mouth every 4 (four) hours as needed for cough.  . hydroxychloroquine (PLAQUENIL) 200 MG tablet Take 1 tablet (200 mg total) by mouth daily.  Marland Kitchen KLOR-CON SPRINKLE 10 MEQ CR capsule TAKE 2 CAPSULES TWICE A DAY  . lip balm (BLISTEX) OINT Apply 1 application topically as needed for lip care.  . meloxicam (MOBIC) 15 MG tablet Take 15 mg by mouth daily as needed for pain.  . metoprolol (LOPRESSOR) 50 MG tablet Take 1 tablet (50 mg total) by mouth 2 (two) times daily.  . Multiple Vitamin (MULTIVITAMIN WITH MINERALS) TABS tablet Take 1 tablet by mouth daily.  Marland Kitchen nystatin (MYCOSTATIN) 100000 UNIT/ML suspension Take 5 mLs (500,000 Units total) by mouth 4 (four) times daily.  Marland Kitchen nystatin cream (MYCOSTATIN) APPLY 1 APPLICATION TOPICALLY TO THE AFFECTED AREA (OUTER VAGINA) TWICE A DAY  . ondansetron (ZOFRAN ODT) 4 MG disintegrating tablet Take 1 tablet (4 mg total) by mouth every 8 (eight) hours as needed for nausea or vomiting.  . pramipexole (MIRAPEX) 0.25 MG tablet TAKE 2 TABLETS TWICE A DAY  . predniSONE (DELTASONE) 5 MG tablet Take 5 tabs po day 1; take 4 tabs po day2; 3 tabs po day3; take 2 tabs po day2; take one tab daily afterwards  . PROAIR HFA 108 (90 Base) MCG/ACT inhaler USE 2 INHALATIONS EVERY 4 HOURS AS NEEDED FOR WHEEZING OR SHORTNESS OF BREATH  . RABEprazole (ACIPHEX) 20 MG tablet Take 1 tablet (20 mg total) by mouth 2 (two) times daily before a meal.  . simvastatin (ZOCOR) 20 MG tablet Take 20 mg by mouth daily.   . sodium chloride (OCEAN) 0.65 % nasal spray Place 1 spray into the nose as needed for congestion.  Marland Kitchen tiotropium (SPIRIVA) 18 MCG inhalation capsule Place 1 capsule (18 mcg total) into inhaler and inhale daily.  . TOPROL XL 50 MG 24 hr tablet   . traMADol (ULTRAM) 50 MG tablet Take 50 mg by mouth 2 (two) times daily as needed for moderate pain.  .  [DISCONTINUED] cefUROXime (CEFTIN) 500 MG tablet Take 1 tablet (500 mg total) by mouth 2 (two) times daily with a meal.   No facility-administered encounter medications on file as of 07/29/2016.     Review of Systems  Constitutional: Negative for appetite change and unexpected weight change.  HENT: Negative for congestion and sinus pressure.   Respiratory: Positive for cough. Negative for chest tightness and shortness of breath.   Cardiovascular: Negative for chest pain, palpitations and leg swelling.  Gastrointestinal:  Positive for diarrhea. Negative for abdominal pain, nausea and vomiting.  Genitourinary: Negative for difficulty urinating and dysuria.  Musculoskeletal: Negative for back pain and myalgias.  Skin: Negative for color change and rash.  Neurological: Negative for dizziness, light-headedness and headaches.  Psychiatric/Behavioral: Negative for agitation and dysphoric mood.       Objective:    Physical Exam  Constitutional: She appears well-developed and well-nourished. No distress.  HENT:  Nose: Nose normal.  Mouth/Throat: Oropharynx is clear and moist.  Neck: Neck supple. No thyromegaly present.  Cardiovascular: Normal rate and regular rhythm.   Pulmonary/Chest: Breath sounds normal. No respiratory distress. She has no wheezes.  Abdominal: Soft. Bowel sounds are normal. There is no tenderness.  Musculoskeletal: She exhibits no edema or tenderness.  Lymphadenopathy:    She has no cervical adenopathy.  Skin: No rash noted. No erythema.  Psychiatric: She has a normal mood and affect. Her behavior is normal.    BP 134/68   Pulse (!) 57   Temp 97.6 F (36.4 C) (Oral)   Ht 5\' 6"  (1.676 m)   Wt 165 lb 12.8 oz (75.2 kg)   SpO2 94%   BMI 26.76 kg/m  Wt Readings from Last 3 Encounters:  07/29/16 165 lb 12.8 oz (75.2 kg)  07/21/16 162 lb (73.5 kg)  07/01/16 168 lb 12.8 oz (76.6 kg)     Lab Results  Component Value Date   WBC 7.4 07/29/2016   HGB 11.3 (L)  07/29/2016   HCT 33.7 (L) 07/29/2016   PLT 238 07/29/2016   GLUCOSE 84 06/28/2016   CHOL 115 06/28/2016   TRIG 72.0 06/28/2016   HDL 60.90 06/28/2016   LDLDIRECT 73.7 08/20/2013   LDLCALC 40 06/28/2016   ALT 29 06/28/2016   AST 22 06/28/2016   NA 139 06/28/2016   K 4.2 06/28/2016   CL 99 06/28/2016   CREATININE 0.93 06/28/2016   BUN 10 06/28/2016   CO2 32 06/28/2016   TSH 1.18 12/18/2015   INR 1.05 06/16/2016    Ct Head Wo Contrast  Result Date: 06/19/2016 CLINICAL DATA:  Headache and dizziness EXAM: CT HEAD WITHOUT CONTRAST TECHNIQUE: Contiguous axial images were obtained from the base of the skull through the vertex without intravenous contrast. COMPARISON:  CT head 06/07/2015 FINDINGS: Brain: Generalized atrophy is unchanged. Chronic microvascular ischemic change in the white matter stable. Negative for acute infarct.  Negative for acute hemorrhage or mass. Vascular: No hyperdense vessel or unexpected calcification. Skull: Negative Sinuses/Orbits: Negative Other: None IMPRESSION: No acute intracranial abnormality. Atrophy and chronic microvascular ischemic change, stable since the prior study. Electronically Signed   By: Franchot Gallo M.D.   On: 06/19/2016 15:14   Mr Brain Wo Contrast  Result Date: 06/20/2016 CLINICAL DATA:  75 year old female with generalized weakness and fever. 102.3 Fahrenheit. Nausea vomiting, productive cough. Initial encounter. EXAM: MRI HEAD WITHOUT CONTRAST TECHNIQUE: Multiplanar, multiecho pulse sequences of the brain and surrounding structures were obtained without intravenous contrast. COMPARISON:  Head CT without contrast 06/19/2016, brain MRI without and with contrast 09/21/2010. FINDINGS: Brain: No restricted diffusion to suggest acute infarction. No midline shift, mass effect, evidence of mass lesion, ventriculomegaly, extra-axial collection or acute intracranial hemorrhage. Cervicomedullary junction and pituitary are within normal limits. Mild  progression of patchy in scattered cerebral white matter T2 and FLAIR hyperintensity since 2012. The extent is now mild to moderate for age. The configuration is nonspecific, there is some temporal lobe involvement on the left. No cortical edema or cortical encephalomalacia identified.  Insula and mesial temporal lobe structures appear normal. 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 Other deep gray matter nuclei appear normal. Negative brainstem and cerebellum. Vascular: Major intracranial vascular flow voids are stable since 2012, with mild intracranial artery dolichoectasia. Skull and upper cervical spine: Negative. Normal bone marrow signal. Sinuses/Orbits: Postoperative changes to both globes, otherwise negative orbits soft tissues. Trace paranasal sinus mucosal thickening. Trace mastoid fluid more so on the left. Negative nasopharynx. Other: Visible internal auditory structures appear normal. Negative scalp soft tissues. IMPRESSION: 1.  No acute intracranial abnormality. 2. Mild to moderate for age nonspecific white matter signal changes in the brain, most commonly due to chronic small vessel disease, with some progression since 2012 Electronically Signed   By: Genevie Ann M.D.   On: 06/20/2016 14:44   Ct Abdomen Pelvis W Contrast  Result Date: 06/19/2016 CLINICAL DATA:  Headache, dizziness, nausea, and vomiting beginning yesterday, history cervical cancer, cholecystectomy, hysterectomy, rheumatoid arthritis, prior diverticulitis, hypertension EXAM: CT ABDOMEN AND PELVIS WITH CONTRAST TECHNIQUE: Multidetector CT imaging of the abdomen and pelvis was performed using the standard protocol following bolus administration of intravenous contrast. Sagittal and coronal MPR images reconstructed from axial data set. CONTRAST:  115mL ISOVUE-300 IOPAMIDOL (ISOVUE-300) INJECTION 61% IV. Dilute oral contrast. COMPARISON:  08/25/2014 FINDINGS: Lower chest: Subsegmental atelectasis BILATERAL lower  lobes greater on LEFT. Patchy infiltrates RIGHT middle lobe and RIGHT lower lobe. Tiny BILATERAL pleural effusions. Hepatobiliary: Gallbladder surgically absent. Mild fatty infiltration of liver. No biliary dilatation. 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 Spleen otherwise normal appearance Stomach/Bowel: Adrenal glands normal appearance. Prominent extrarenal pelves bilaterally. Mildly prominent RIGHT ureter. No ureteral calcification. Bladder unremarkable. Vascular/Lymphatic: Appendix not visualized, but no pericecal inflammatory process seen. Contrast present to rectum. Few scattered sigmoid diverticula. Colon under distended, no definite wall thickening within this limitation. Gastric antrum underdistended without evidence of outlet obstruction, unable to accurately assess at antral wall thickness in this setting. Small bowel loops unremarkable. Reproductive: Scattered atherosclerotic calcifications aorta without aneurysm. Coronary arterial calcifications noted. No adenopathy. Other: No free air or free fluid. Musculoskeletal: Osseous demineralization. Degenerative disc disease changes lumbar spine. IMPRESSION: Patchy RIGHT basilar pulmonary infiltrates. Fatty infiltration of liver. 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 Minimal sigmoid diverticulosis without evidence of diverticulitis. No acute intra-abdominal or intrapelvic abnormalities. Aortic atherosclerosis and coronary arterial calcification. Electronically Signed   By: Lavonia Dana M.D.   On: 06/19/2016 16:11       Assessment & Plan:   Problem List Items Addressed This Visit    A-fib [facility])    Evaluated by cardiology.  In SR now.  On metoprolol.  Stable.       Anemia    Last hgb improved.  Recheck cbc.        Relevant Orders   CBC w/Diff (Completed)   Ferritin (Completed)   CAP (community  acquired pneumonia)    Check cxr to confirm clearance.        Cough    Some persistent cough.  Is better.  Check cxr for clearance.       Diarrhea    Taking a probiotic.  Bowels are some better.  Still with intermittent flares and loose stool.  Has f/u with GI, but she prefers earlier appt.  Will schedule.        Relevant Orders   Ambulatory referral to Gastroenterology   Foot  fracture    Being followed by podiatry.  In a boot now.  Follow.        GERD (gastroesophageal reflux disease)    On aciphex.  Symptoms controlled.        Hypercholesterolemia    On simvastatin.  Low cholesterol diet and exercise.  Follow lipid panel and liver function tests.        Hypertension    Blood pressure under good control.  Continue same medication regimen.  Follow pressures.  Follow metabolic panel.        Hypoxia    Uses oxygen nightly and prn during the day.  Symptoms improved.  Breathing better.  Benefits from oxygen supplementation.        Rheumatoid arthritis ([place])    Followed by Dr [name].  Currently stable.         Other Visit Diagnoses    Abnormal CXR    -  Primary   Relevant Orders   DG Chest 2 View (Completed)       [name], MD

## 2016-07-31 ENCOUNTER — Encounter: Payer: Self-pay | Admitting: Internal Medicine

## 2016-07-31 NOTE — Assessment & Plan Note (Signed)
Evaluated by cardiology.  In SR now.  On metoprolol.  Stable.

## 2016-07-31 NOTE — Assessment & Plan Note (Signed)
Uses oxygen nightly and prn during the day.  Symptoms improved.  Breathing better.  Benefits from oxygen supplementation.

## 2016-07-31 NOTE — Assessment & Plan Note (Signed)
On simvastatin.  Low cholesterol diet and exercise.  Follow lipid panel and liver function tests.   

## 2016-07-31 NOTE — Assessment & Plan Note (Signed)
Followed by Dr [name].  Currently stable.

## 2016-07-31 NOTE — Assessment & Plan Note (Signed)
Check cxr to confirm clearance.

## 2016-07-31 NOTE — Assessment & Plan Note (Signed)
Being followed by podiatry.  In a boot now.  Follow.

## 2016-07-31 NOTE — Assessment & Plan Note (Signed)
Blood pressure under good control.  Continue same medication regimen.  Follow pressures.  Follow metabolic panel.   

## 2016-07-31 NOTE — Assessment & Plan Note (Signed)
Some persistent cough.  Is better.  Check cxr for clearance.

## 2016-07-31 NOTE — Assessment & Plan Note (Signed)
Last hgb improved.  Recheck cbc.

## 2016-07-31 NOTE — Assessment & Plan Note (Signed)
On aciphex.  Symptoms controlled.

## 2016-07-31 NOTE — Assessment & Plan Note (Signed)
Taking a probiotic.  Bowels are some better.  Still with intermittent flares and loose stool.  Has f/u with GI, but she prefers earlier appt.  Will schedule.

## 2016-08-01 ENCOUNTER — Encounter: Payer: Self-pay | Admitting: *Deleted

## 2016-08-01 ENCOUNTER — Other Ambulatory Visit: Payer: Self-pay | Admitting: *Deleted

## 2016-08-01 MED ORDER — ALPRAZOLAM 0.25 MG PO TABS: 0.2500 mg | ORAL_TABLET | Freq: Every day | ORAL | 0 refills | Status: DC | PRN

## 2016-08-01 NOTE — Telephone Encounter (Signed)
Okay to refill? Last filled in October for #90 with no refills.

## 2016-08-02 DIAGNOSIS — M05732 Rheumatoid arthritis with rheumatoid factor of left wrist without organ or systems involvement: Secondary | ICD-10-CM | POA: Diagnosis not present

## 2016-08-02 DIAGNOSIS — M0579 Rheumatoid arthritis with rheumatoid factor of multiple sites without organ or systems involvement: Secondary | ICD-10-CM | POA: Diagnosis not present

## 2016-08-02 DIAGNOSIS — M05731 Rheumatoid arthritis with rheumatoid factor of right wrist without organ or systems involvement: Secondary | ICD-10-CM | POA: Diagnosis not present

## 2016-08-02 DIAGNOSIS — M810 Age-related osteoporosis without current pathological fracture: Secondary | ICD-10-CM | POA: Diagnosis not present

## 2016-08-03 ENCOUNTER — Other Ambulatory Visit: Payer: Self-pay | Admitting: Internal Medicine

## 2016-08-03 MED ORDER — ALBUTEROL SULFATE HFA 108 (90 BASE) MCG/ACT IN AERS: INHALATION_SPRAY | RESPIRATORY_TRACT | 0 refills | Status: DC

## 2016-08-03 NOTE — Telephone Encounter (Signed)
Last OV 07/29/16 last filled 08/01/16 Has already been filled

## 2016-08-03 NOTE — Telephone Encounter (Signed)
Refilled 06/2016. Pt last seen 07/29/16. Please advise?

## 2016-08-05 ENCOUNTER — Ambulatory Visit: Payer: Medicare Other | Admitting: Internal Medicine

## 2016-08-08 ENCOUNTER — Encounter: Payer: Self-pay | Admitting: Internal Medicine

## 2016-08-09 ENCOUNTER — Encounter: Payer: Self-pay | Admitting: Emergency Medicine

## 2016-08-09 ENCOUNTER — Telehealth: Payer: Self-pay | Admitting: Internal Medicine

## 2016-08-09 ENCOUNTER — Emergency Department: Payer: Medicare Other

## 2016-08-09 ENCOUNTER — Emergency Department
Admission: EM | Admit: 2016-08-09 | Discharge: 2016-08-09 | Disposition: A | Discharge status: Home / Self Care | Payer: Medicare Other | Attending: Emergency Medicine | Admitting: Emergency Medicine

## 2016-08-09 DIAGNOSIS — Z79899 Other long term (current) drug therapy: Secondary | ICD-10-CM | POA: Diagnosis not present

## 2016-08-09 DIAGNOSIS — R42 Dizziness and giddiness: Secondary | ICD-10-CM | POA: Insufficient documentation

## 2016-08-09 DIAGNOSIS — R51 Headache: Secondary | ICD-10-CM | POA: Insufficient documentation

## 2016-08-09 DIAGNOSIS — I10 Essential (primary) hypertension: Secondary | ICD-10-CM | POA: Insufficient documentation

## 2016-08-09 DIAGNOSIS — Z7982 Long term (current) use of aspirin: Secondary | ICD-10-CM | POA: Diagnosis not present

## 2016-08-09 DIAGNOSIS — R519 Headache, unspecified: Secondary | ICD-10-CM

## 2016-08-09 DIAGNOSIS — S0990XA Unspecified injury of head, initial encounter: Secondary | ICD-10-CM | POA: Diagnosis not present

## 2016-08-09 MED ORDER — MECLIZINE HCL 25 MG PO TABS: 25.0000 mg | ORAL_TABLET | Freq: Three times a day (TID) | ORAL | 0 refills | Status: DC | PRN

## 2016-08-09 MED ORDER — MECLIZINE HCL 25 MG PO TABS
25.0000 mg | ORAL_TABLET | Freq: Once | ORAL | Status: AC
  Administered 2016-08-09: 25 mg via ORAL
  Filled 2016-08-09: qty 1

## 2016-08-09 NOTE — ED Triage Notes (Signed)
Patient presents to the ED with headaches, dizziness, and "seeing stars" since she fell approx. 5 days ago and hit the back of her head.  Patient denies losing consciousness after the fall.  Patient is alert and oriented x 4.  Patient states, "Dr. Nicki Reaper said to come on in and get a head CT."

## 2016-08-09 NOTE — Telephone Encounter (Signed)
Reason for call:dizziness Symptoms:dizziness when walking, and getting up from seated positions, seeing stars ,no nausea, some headaches, dizziness last 2-3 days  Duration fell hit back of head on tile in bath tub on the  16th  Medications:aleve  Last seen for this problem: not seen  Urged to go to urgent care for evaluation patient wants your opinion  Please advise

## 2016-08-09 NOTE — Telephone Encounter (Signed)
Pt called and stated that she had fallen in the bathtub and hit her head around the 16 th. Pt is c/o of dizziness and sees starswhen she bends over. She is not sure if she needs an xray. Please advise, thank you!  Call pt @ [phone]

## 2016-08-09 NOTE — Telephone Encounter (Signed)
Patient advised to go to ER for further work up .  She states she doesn't have transportation now, will have to wait until daughter available to take her .    I stressed importance of her getting evaluated as soon as possible .

## 2016-08-09 NOTE — ED Provider Notes (Signed)
[facility] Emergency Department Provider Note  ____________________________________________   I have reviewed the triage vital signs and the nursing notes.   HISTORY  Chief Complaint Headache   History limited by: Not Limited   HPI Kaitlyn Huff is a 76 y.o. female who presents to the emergency department today because of concerns for lightheadedness dizziness and some headache since falling 5 days ago. She states she was in her bathroom when she thinks that her hard sole shoe on her right foot got, on a rug. She fell and hit her head. She denies any loss of consciousness. She did not have any nausea or vomiting. Her symptoms started couple days later. Her symptoms are worse when she stands up from a sitting position. They go away after a few minutes. The patient denies any fevers.   Past Medical History:  Diagnosis Date  . Anemia   . Cancer ([place])   . Diverticulitis   . GERD (gastroesophageal reflux disease)   . Hyperlipidemia   . Hypertension   . Osteoarthritis   . Osteoporosis    actonel  . Positive PPD    s/p INH (2006)  . Rheumatoid arthritis(714.0)    MTX transaminitis, Leflunomide (rash), enbrel, plaquinil, prednisone, remicade, Imuran (transaminitis)  . Valvular heart disease    moderate MR and TR    Patient Active Problem List   Diagnosis Date Noted  . A-fib ([place]) 07/12/2016  . Pneumonia 06/17/2016  . 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 Chronic venous insufficiency 04/25/2016  . Pain in limb 04/25/2016  . Swelling of limb 04/25/2016  . Tachycardia 03/22/2016  . Foot fracture 03/18/2016  . Cough 01/27/2016  . Hypoxia 12/20/2015  . Lower extremity edema 12/18/2015  . Diarrhea 09/15/2015  . CAP (community acquired pneumonia) 08/30/2015  . Sepsis ([place]) 08/18/2015  . Restless leg syndrome 08/07/2015  . History of colonic polyps 07/10/2015  . Health care maintenance 01/11/2015  . Weight loss  08/04/2013  . Obstructive sleep apnea 12/23/2012  . Dilated bile duct 12/23/2012  . Hypercholesterolemia 08/12/2012  . Rheumatoid arthritis ([place]) 08/12/2012  . GERD (gastroesophageal reflux disease) 08/12/2012  . Hypertension 08/12/2012  . Anemia 08/12/2012    Past Surgical History:  Procedure Laterality Date  . ABDOMINAL HYSTERECTOMY    . CERVICAL CONE BIOPSY    . CHOLECYSTECTOMY  06/22/14  . COLONOSCOPY WITH PROPOFOL N/A 07/09/2015   Procedure: COLONOSCOPY WITH PROPOFOL;  Surgeon: Manya Silvas, MD;  Location: [facility] ENDOSCOPY;  Service: Endoscopy;  Laterality: N/A;  . ESOPHAGOGASTRODUODENOSCOPY (EGD) WITH PROPOFOL N/A 07/09/2015   Procedure: ESOPHAGOGASTRODUODENOSCOPY (EGD) WITH PROPOFOL;  Surgeon: Manya Silvas, MD;  Location: [facility] ENDOSCOPY;  Service: Endoscopy;  Laterality: N/A;  . OVARY SURGERY    . TRACHEOSTOMY  1959  . TUBAL LIGATION      Prior to Admission medications   Medication Sig Start Date End Date Taking? Authorizing Provider  acidophilus (RISAQUAD) CAPS capsule Take 1 capsule by mouth daily.    Historical Provider, MD  ADVAIR DISKUS 250-50 MCG/DOSE AEPB USE 1 INHALATION EVERY 12 HOURS 07/07/16   [name], MD  albuterol (PROAIR HFA) 108 (90 Base) MCG/ACT inhaler USE 2 INHALATIONS EVERY 4 HOURS AS NEEDED FOR WHEEZING OR SHORTNESS OF BREATH 08/03/16   [name], MD  albuterol (PROVENTIL) (2.5 MG/3ML) 0.083% nebulizer solution Take 2.5 mg by nebulization every 6 (six) hours as needed for wheezing or shortness of breath.    Historical Provider, MD  alendronate (FOSAMAX)  70 MG tablet Take 70 mg by mouth once a week.  09/04/15   Historical Provider, MD  ALPRAZolam [name]) 0.25 MG tablet Take 1 tablet (0.25 mg total) by mouth daily as needed for anxiety. 08/01/16   [name], MD  aspirin EC 81 MG tablet Take 81 mg by mouth daily.    Historical Provider, MD  Calcium Carbonate-Vitamin D (CALCIUM 600+D) 600-400 MG-UNIT tablet Take 1 tablet by mouth 2 (two)  times daily.    Historical Provider, MD  cetirizine (ZYRTEC) 10 MG tablet Take 1 tablet (10 mg total) by mouth daily. 07/01/16   [name], MD  cholecalciferol (VITAMIN D) 1000 units tablet Take 1,000 Units by mouth daily.    Historical Provider, MD  clonazePAM (KLONOPIN) 0.5 MG tablet Take 0.5 mg by mouth 2 (two) times daily as needed for anxiety.    Historical Provider, MD  colestipol (COLESTID) 1 g tablet Take 2 g by mouth 2 (two) times daily.    Historical Provider, MD  Diphenhyd-Hydrocort-Nystatin (FIRST-DUKES MOUTHWASH) SUSP Pt is to swish and spit 5 cc's tid 06/06/16   Burnard Hawthorne, FNP  FeFum-FePo-FA-B Cmp-C-Zn-Mn-Cu (TANDEM PLUS) 162-115.2-1 MG CAPS Take 1 capsule by mouth daily. 05/31/16   [name], MD  fluticasone (FLONASE) 50 MCG/ACT nasal spray Place 2 sprays into both nostrils daily. 01/27/16   Leone Haven, MD  furosemide (LASIX) 20 MG tablet Take 1 tablet (20 mg total) by mouth daily. 05/31/16   [name], MD  furosemide (LASIX) 40 MG tablet  07/07/16   Historical Provider, MD  gabapentin (NEURONTIN) 300 MG capsule Take 300 mg by mouth 4 (four) times daily.    Historical Provider, MD  guaiFENesin-dextromethorphan (ROBITUSSIN DM) 100-10 MG/5ML syrup Take 5 mLs by mouth every 4 (four) hours as needed for cough. 08/07/15   Theodoro Grist, MD  hydroxychloroquine (PLAQUENIL) 200 MG tablet Take 1 tablet (200 mg total) by mouth daily. 06/21/16   Loletha Grayer, MD  KLOR-CON SPRINKLE 10 MEQ CR capsule TAKE 2 CAPSULES TWICE A DAY 06/09/16   [name], MD  lip balm (BLISTEX) OINT Apply 1 application topically as needed for lip care. 06/21/16   Loletha Grayer, MD  meloxicam (MOBIC) 15 MG tablet Take 15 mg by mouth daily as needed for pain.    Historical Provider, MD  metoprolol (LOPRESSOR) 50 MG tablet Take 1 tablet (50 mg total) by mouth 2 (two) times daily. 07/19/16   [name], MD  Multiple Vitamin (MULTIVITAMIN WITH MINERALS) TABS tablet Take 1 tablet by  mouth daily.    Historical Provider, MD  nystatin (MYCOSTATIN) 100000 UNIT/ML suspension Take 5 mLs (500,000 Units total) by mouth 4 (four) times daily. 06/21/16   Loletha Grayer, MD  nystatin cream (MYCOSTATIN) APPLY 1 APPLICATION TOPICALLY TO THE AFFECTED AREA (OUTER VAGINA) TWICE A DAY 05/30/16   Historical Provider, MD  ondansetron (ZOFRAN ODT) 4 MG disintegrating tablet Take 1 tablet (4 mg total) by mouth every 8 (eight) hours as needed for nausea or vomiting. 06/21/16   Loletha Grayer, MD  pramipexole (MIRAPEX) 0.25 MG tablet TAKE 2 TABLETS TWICE A DAY 05/30/16   [name], MD  predniSONE (DELTASONE) 5 MG tablet Take 5 tabs po day 1; take 4 tabs po day2; 3 tabs po day3; take 2 tabs po day2; take one tab daily afterwards 06/21/16   Loletha Grayer, MD  RABEprazole (ACIPHEX) 20 MG tablet Take 1 tablet (20 mg total) by mouth 2 (two) times daily before a meal. 04/08/16  [name], MD  simvastatin (ZOCOR) 20 MG tablet Take 20 mg by mouth daily.     Historical Provider, MD  sodium chloride (OCEAN) 0.65 % nasal spray Place 1 spray into the nose as needed for congestion.    Historical Provider, MD  tiotropium (SPIRIVA) 18 MCG inhalation capsule Place 1 capsule (18 mcg total) into inhaler and inhale daily. 05/31/16 08/29/16  [name], MD  TOPROL XL 50 MG 24 hr tablet  06/21/16   Historical Provider, MD  traMADol (ULTRAM) 50 MG tablet Take 50 mg by mouth 2 (two) times daily as needed for moderate pain.    Historical Provider, MD    Allergies Astelin [azelastine hcl]; Ciprofloxacin; Codeine; Dilaudid [hydromorphone]; Flexeril [cyclobenzaprine]; Imuran [azathioprine]; Keflex [cephalexin]; Lisinopril; Lyrica [pregabalin]; Methotrexate derivatives; Amoxicillin; Arava [leflunomide]; Clindamycin/lincomycin; Doxycycline; Lodine [etodolac]; Percocet [oxycodone-acetaminophen]; and Sulfa antibiotics  Family History  Problem Relation Age of Onset  . Heart disease Father     MI  . Heart disease  Mother   . Valvular heart disease Mother   . Breast cancer Sister 69  . Cancer Sister     Lung cancer  . Colon cancer Neg Hx     Social History Social History  Substance Use Topics  . Smoking status: Never Smoker  . Smokeless tobacco: Never Used  . Alcohol use No    Review of Systems  Constitutional: Negative for fever. Cardiovascular: Negative for chest pain. Respiratory: Negative for shortness of breath. Gastrointestinal: Negative for abdominal pain, vomiting and diarrhea. Genitourinary: Negative for dysuria. Musculoskeletal: Negative for back pain. Skin: Negative for rash. Neurological: Positive for headache and dizziness.  10-point ROS otherwise negative.  ____________________________________________   PHYSICAL EXAM:  VITAL SIGNS: ED Triage Vitals  Enc Vitals Group     BP 08/09/16 1658 (!) 171/61     Pulse Rate 08/09/16 1658 (!) 51     Resp 08/09/16 1658 18     Temp 08/09/16 1658 98.1 F (36.7 C)     Temp Source 08/09/16 1658 Oral     SpO2 08/09/16 1658 100 %     Weight 08/09/16 1659 168 lb (76.2 kg)     Height 08/09/16 1659 5\' 4"  (1.626 m)     Head Circumference --      Peak Flow --      Pain Score 08/09/16 1659 0   Constitutional: Alert and oriented. Well appearing and in no distress. Eyes: Conjunctivae are normal. Normal extraocular movements. ENT   Head: Normocephalic and atraumatic.   Nose: No congestion/rhinnorhea.   Mouth/Throat: Mucous membranes are moist.   Neck: No stridor. Hematological/Lymphatic/Immunilogical: No cervical lymphadenopathy. Cardiovascular: Normal rate, regular rhythm.  No murmurs, rubs, or gallops. Respiratory: Normal respiratory effort without tachypnea nor retractions. Breath sounds are clear and equal bilaterally. No wheezes/rales/rhonchi. Gastrointestinal: Soft and non tender. No rebound. No guarding.  Genitourinary: Deferred Musculoskeletal: Normal range of motion in all extremities. No lower extremity  edema. Neurologic:  Normal speech and language. Face symmetric. Tongue midline. PERRL. EOMI. No pronator drift. Strength 5/5 in upper and lower extremities. Sensation intact.  Skin:  Skin is warm, dry and intact. No rash noted. Psychiatric: Mood and affect are normal. Speech and behavior are normal. Patient exhibits appropriate insight and judgment. ____________________________________________    LABS (pertinent positives/negatives)  Labs Reviewed - No data to display   ____________________________________________   EKG  None  ____________________________________________    RADIOLOGY  CT head IMPRESSION: Chronic atrophic and ischemic changes without acute abnormality. ____________________________________________   PROCEDURES  Procedures  ____________________________________________   INITIAL IMPRESSION / ASSESSMENT AND PLAN / ED COURSE  Pertinent labs & imaging results that were available during my care of the patient were reviewed by me and considered in my medical decision making (see chart for details).  Patient presented because of concern for dizziness after fall 5 days ago. Head ct negative. No focal neuro deficits. Orthostatics normal. Will trial patient on antivert.   ____________________________________________   FINAL CLINICAL IMPRESSION(S) / ED DIAGNOSES  Final diagnoses:  Nonintractable headache, unspecified chronicity pattern, unspecified headache type     Note: This dictation was prepared with Dragon dictation. Any transcriptional errors that result from this process are unintentional     Nance Pear, MD 08/09/16 2134

## 2016-08-09 NOTE — Discharge Instructions (Signed)
Please seek medical attention for any high fevers, chest pain, shortness of breath, change in behavior, persistent vomiting, bloody stool or any other new or concerning symptoms.  

## 2016-08-09 NOTE — Telephone Encounter (Signed)
Please f/u and confirm pt was seen.  Thanks

## 2016-08-09 NOTE — ED Notes (Signed)
Dr. Archie Balboa at the bedside for pt evaluation. Pt currently denies pain. Alert and talkative during assessment. Able to answer questions appropriately.

## 2016-08-09 NOTE — Telephone Encounter (Signed)
If dizzy and hit head, agree with evaluation now.

## 2016-08-10 NOTE — Telephone Encounter (Signed)
Spoke with patient and she did go to ER and was evaluated no Bleed, no concussion. Was prescribed Meclizine for dizziness .

## 2016-08-12 ENCOUNTER — Other Ambulatory Visit: Payer: Self-pay | Admitting: Internal Medicine

## 2016-08-15 ENCOUNTER — Other Ambulatory Visit: Payer: Self-pay | Admitting: Internal Medicine

## 2016-08-15 ENCOUNTER — Telehealth: Payer: Self-pay | Admitting: Internal Medicine

## 2016-08-15 DIAGNOSIS — S92321D Displaced fracture of second metatarsal bone, right foot, subsequent encounter for fracture with routine healing: Secondary | ICD-10-CM | POA: Diagnosis not present

## 2016-08-15 NOTE — Telephone Encounter (Signed)
Still having trouble with vertigo, almost out of meclizine.   The more she moves around the [place] she gets.   Still coughing some times productive clear mucus , she ran out of OTC Robutussin .   No fever. Seen by  ER 08/09/16.   Please  advise

## 2016-08-15 NOTE — Telephone Encounter (Signed)
Patient advised of below and verbalized understanding.  

## 2016-08-15 NOTE — Telephone Encounter (Signed)
Refilled 08/09/2016. Pt last seen 07/29/16. Please advise?

## 2016-08-15 NOTE — Telephone Encounter (Signed)
Left message to call.

## 2016-08-15 NOTE — Telephone Encounter (Signed)
Reviewed ER note.  She had fall and it appears symptoms started after this.  If persistent dizziness s/p fall, she needs to be reevaluated and then can determine appropriate therapy needed.   (acute care or urgent care) - to confirm nothing more acute going on.

## 2016-08-15 NOTE — Telephone Encounter (Signed)
Pt called and stated that she was in the ED last week for an inner ear problem. She is almost out of the medication that they prescribed for her (meclizine (ANTIVERT) 25 MG tablet) and stated that it is not getting any better. Please advise, thank you!  She also states that she needs Diphenhyd-Hydrocort-Nystatin (FIRST-DUKES MOUTHWASH) SUSP.  Call pt @ [place], [place]

## 2016-08-16 NOTE — Telephone Encounter (Signed)
See phone note.  Pt to be reevaluated.

## 2016-08-18 ENCOUNTER — Other Ambulatory Visit: Payer: Self-pay | Admitting: Internal Medicine

## 2016-08-29 DIAGNOSIS — R42 Dizziness and giddiness: Secondary | ICD-10-CM | POA: Diagnosis not present

## 2016-09-05 ENCOUNTER — Telehealth: Payer: Self-pay | Admitting: Internal Medicine

## 2016-09-05 DIAGNOSIS — R42 Dizziness and giddiness: Secondary | ICD-10-CM

## 2016-09-05 NOTE — Telephone Encounter (Signed)
Pt called and stated that she went to Dr. Tami Ribas in regards to her fall and vertigo. Pt stated that she is not getting any better and has been taking the meclizine (ANTIVERT) 25 MG tablet. Pt wants to know if Dr. Nicki Reaper wants to go an do that other xray. Please advise, thank you!  Call pt @ [phone]

## 2016-09-05 NOTE — Telephone Encounter (Signed)
Notes from Dr Tami Ribas in your green folder for your review . Please advise. Thanks.

## 2016-09-05 NOTE — Telephone Encounter (Signed)
Please notify her that I want to review Dr Ileene Hutchinson notes.  See other questions for her in my previous note.  If acute issues, to urgent care.

## 2016-09-05 NOTE — Telephone Encounter (Signed)
Patient still having dizziness , when she bends over. Having headache over right eye. Taking Aleve other counter.   Helps to relieve some.   Dr Tami Ribas has ordered therapy for dizziness Please advise.

## 2016-09-05 NOTE — Telephone Encounter (Signed)
Patients advised of below and verbalized understanding.  She has appointment for  Vertigo therapy  09/22/15 .    If any acute issues will go to urgent care for evaluation.

## 2016-09-05 NOTE — Telephone Encounter (Signed)
If she just saw Dr Tami Ribas for the dizziness and he ordered therapy - has she been?  Also, need notes from Dr Ileene Hutchinson office to know what w/up recommended and what has been done.  If acute symptoms, can be reevaluated.

## 2016-09-06 NOTE — Telephone Encounter (Signed)
Reviewed ENT notes.  Agree with plans for vestibular rehab.  I would also like to have pt evaluated by neurology.  If agreeable, let me know and I will place the order for the referral.  If acute change in symptoms, needs evaluation.

## 2016-09-06 NOTE — Telephone Encounter (Signed)
Patient advised of below.   She is ok with going to see neurologist.   If any changes in symptoms she will follow up with urgent care or our office.

## 2016-09-07 NOTE — Addendum Note (Signed)
Addended by: Alisa Graff on: 09/07/2016 05:22 AM   Modules accepted: Orders

## 2016-09-07 NOTE — Telephone Encounter (Signed)
Order placed for neurology referral.   

## 2016-09-29 ENCOUNTER — Ambulatory Visit (INDEPENDENT_AMBULATORY_CARE_PROVIDER_SITE_OTHER): Payer: Medicare Other | Admitting: Internal Medicine

## 2016-09-29 ENCOUNTER — Encounter: Payer: Self-pay | Admitting: Internal Medicine

## 2016-09-29 VITALS — BP 128/82 | HR 60 | Temp 98.6°F | Resp 16 | Ht 64.0 in | Wt 162.4 lb

## 2016-09-29 DIAGNOSIS — R197 Diarrhea, unspecified: Secondary | ICD-10-CM | POA: Diagnosis not present

## 2016-09-29 DIAGNOSIS — M069 Rheumatoid arthritis, unspecified: Secondary | ICD-10-CM

## 2016-09-29 DIAGNOSIS — S92902A Unspecified fracture of left foot, initial encounter for closed fracture: Secondary | ICD-10-CM | POA: Diagnosis not present

## 2016-09-29 DIAGNOSIS — R42 Dizziness and giddiness: Secondary | ICD-10-CM

## 2016-09-29 DIAGNOSIS — K838 Other specified diseases of biliary tract: Secondary | ICD-10-CM | POA: Diagnosis not present

## 2016-09-29 DIAGNOSIS — I4891 Unspecified atrial fibrillation: Secondary | ICD-10-CM | POA: Diagnosis not present

## 2016-09-29 DIAGNOSIS — E7800 Pure hypercholesterolemia, unspecified: Secondary | ICD-10-CM

## 2016-09-29 DIAGNOSIS — D649 Anemia, unspecified: Secondary | ICD-10-CM | POA: Diagnosis not present

## 2016-09-29 DIAGNOSIS — I10 Essential (primary) hypertension: Secondary | ICD-10-CM | POA: Diagnosis not present

## 2016-09-29 DIAGNOSIS — K219 Gastro-esophageal reflux disease without esophagitis: Secondary | ICD-10-CM

## 2016-09-29 MED ORDER — ALPRAZOLAM 0.25 MG PO TABS: 0.2500 mg | ORAL_TABLET | Freq: Every day | ORAL | 0 refills | Status: DC | PRN

## 2016-09-29 NOTE — Progress Notes (Signed)
Pre-visit discussion using our clinic review tool. No additional management support is needed unless otherwise documented below in the visit note.  

## 2016-09-29 NOTE — Progress Notes (Signed)
Patient ID: Kaitlyn Huff, female   DOB: 04/21/1941, 76 y.o.   MRN: [id number]   Subjective:    Patient ID: Kaitlyn Huff, female    DOB: 03/25/1941, 76 y.o.   MRN: [id number]  HPI  Patient here for a scheduled follow up.  She is accompanied by her daughter.  History obtained from both of them.  She has been followed by podiatry for foot fracture.  Doing better.  More active now.  Still feels unsteady.  Has some dizziness.  Notices with quick movements.   Also experiences some issues with unsteady gait.  Discussed f/u with neurology.  She has seen ENT.  Planning for vestibular rehab.  Supposed to start next week.  No chest pain.  Breathing stable.  No acid reflux.  She does report persistent diarrhea and some abdominal discomfort.  Intermittently will have emesis.  Has zofran if needed.  Had recent CT.  Unrevealing.  Has a f/u scheduled with GI next week.  She is eating.  Taking nexium.  She was also seen in ER 08/09/16 for headache, lightheadedness and dizziness.  She had fallen several days before ER visit.   See note. CT negative.     Past Medical History:  Diagnosis Date  . Anemia   . Cancer ([place])   . Diverticulitis   . GERD (gastroesophageal reflux disease)   . Hyperlipidemia   . Hypertension   . Osteoarthritis   . Osteoporosis    actonel  . Positive PPD    s/p INH (2006)  . Rheumatoid arthritis(714.0)    MTX transaminitis, Leflunomide (rash), enbrel, plaquinil, prednisone, remicade, Imuran (transaminitis)  . 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 ABDOMINAL HYSTERECTOMY    . CERVICAL CONE BIOPSY    . CHOLECYSTECTOMY  06/22/14  . COLONOSCOPY WITH PROPOFOL N/A 07/09/2015   Procedure: COLONOSCOPY WITH PROPOFOL;  Surgeon: Manya Silvas, MD;  Location: [facility] ENDOSCOPY;  Service: Endoscopy;  Laterality: N/A;  . ESOPHAGOGASTRODUODENOSCOPY (EGD) WITH PROPOFOL N/A 07/09/2015   Procedure: ESOPHAGOGASTRODUODENOSCOPY (EGD)  WITH PROPOFOL;  Surgeon: Manya Silvas, MD;  Location: [facility] ENDOSCOPY;  Service: Endoscopy;  Laterality: N/A;  . OVARY SURGERY    . TRACHEOSTOMY  1959  . TUBAL LIGATION     Family History  Problem Relation Age of Onset  . Heart disease Father     MI  . Heart disease Mother   . Valvular heart disease Mother   . Breast cancer Sister 62  . Cancer Sister     Lung cancer  . Colon cancer Neg Hx    Social History   Social History  . Marital status: Widowed    Spouse name: N/A  . Number of children: 4  . Years of education: N/A   Occupational History  . retired    Social History Main Topics  . Smoking status: Never Smoker  . Smokeless tobacco: Never Used  . Alcohol use No  . Drug use: No  . Sexual activity: No   Other Topics Concern  . None   Social History Narrative   Lives with family at home    Outpatient Encounter Prescriptions as of 09/29/2016  Medication Sig  . acidophilus (RISAQUAD) CAPS capsule Take 1 capsule by mouth daily.  Marland Kitchen ADVAIR DISKUS 250-50 MCG/DOSE AEPB USE 1 INHALATION EVERY 12 HOURS  . albuterol (PROAIR HFA) 108 (90 Base) MCG/ACT inhaler USE 2 INHALATIONS EVERY 4 HOURS AS NEEDED  FOR WHEEZING OR SHORTNESS OF BREATH  . albuterol (PROVENTIL) (2.5 MG/3ML) 0.083% nebulizer solution Take 2.5 mg by nebulization every 6 (six) hours as needed for wheezing or shortness of breath.  Marland Kitchen alendronate (FOSAMAX) 70 MG tablet Take 70 mg by mouth once a week.   . ALPRAZolam (XANAX) 0.25 MG tablet Take 1 tablet (0.25 mg total) by mouth daily as needed for anxiety.  Marland Kitchen aspirin EC 81 MG tablet Take 81 mg by mouth daily.  . Calcium Carbonate-Vitamin D (CALCIUM 600+D) 600-400 MG-UNIT tablet Take 1 tablet by mouth 2 (two) times daily.  . cetirizine (ZYRTEC) 10 MG tablet Take 1 tablet (10 mg total) by mouth daily.  . cholecalciferol (VITAMIN D) 1000 units tablet Take 1,000 Units by mouth daily.  . clonazePAM (KLONOPIN) 0.5 MG tablet Take 0.5 mg by mouth 2 (two) times daily as  needed for anxiety.  . colestipol (COLESTID) 1 g tablet Take 2 g by mouth 2 (two) times daily.  . Diphenhyd-Hydrocort-Nystatin (FIRST-DUKES MOUTHWASH) SUSP Pt is to swish and spit 5 cc's tid  . FeFum-FePo-FA-B Cmp-C-Zn-Mn-Cu (TANDEM PLUS) 162-115.2-1 MG CAPS Take 1 capsule by mouth daily.  . fluticasone (FLONASE) 50 MCG/ACT nasal spray Place 2 sprays into both nostrils daily.  . furosemide (LASIX) 20 MG tablet Take 1 tablet (20 mg total) by mouth daily.  . furosemide (LASIX) 40 MG tablet   . gabapentin (NEURONTIN) 300 MG capsule Take 300 mg by mouth 4 (four) times daily.  Marland Kitchen guaiFENesin-dextromethorphan (ROBITUSSIN DM) 100-10 MG/5ML syrup Take 5 mLs by mouth every 4 (four) hours as needed for cough.  . hydroxychloroquine (PLAQUENIL) 200 MG tablet Take 1 tablet (200 mg total) by mouth daily.  Marland Kitchen KLOR-CON SPRINKLE 10 MEQ CR capsule TAKE 2 CAPSULES TWICE A DAY  . lip balm (BLISTEX) OINT Apply 1 application topically as needed for lip care.  . meclizine (ANTIVERT) 25 MG tablet Take 1 tablet (25 mg total) by mouth 3 (three) times daily as needed for dizziness.  . meloxicam (MOBIC) 15 MG tablet Take 15 mg by mouth daily as needed for pain.  . metoprolol (LOPRESSOR) 50 MG tablet Take 1 tablet (50 mg total) by mouth 2 (two) times daily.  . Multiple Vitamin (MULTIVITAMIN WITH MINERALS) TABS tablet Take 1 tablet by mouth daily.  Marland Kitchen nystatin (MYCOSTATIN) 100000 UNIT/ML suspension Take 5 mLs (500,000 Units total) by mouth 4 (four) times daily.  Marland Kitchen nystatin cream (MYCOSTATIN) APPLY 1 APPLICATION TOPICALLY TO THE AFFECTED AREA (OUTER VAGINA) TWICE A DAY  . ondansetron (ZOFRAN ODT) 4 MG disintegrating tablet Take 1 tablet (4 mg total) by mouth every 8 (eight) hours as needed for nausea or vomiting.  . pramipexole (MIRAPEX) 0.25 MG tablet TAKE 2 TABLETS TWICE A DAY  . predniSONE (DELTASONE) 5 MG tablet Take 5 tabs po day 1; take 4 tabs po day2; 3 tabs po day3; take 2 tabs po day2; take one tab daily afterwards  .  RABEprazole (ACIPHEX) 20 MG tablet Take 1 tablet (20 mg total) by mouth 2 (two) times daily before a meal.  . simvastatin (ZOCOR) 20 MG tablet TAKE 1 TABLET DAILY  . sodium chloride (OCEAN) 0.65 % nasal spray Place 1 spray into the nose as needed for congestion.  Marland Kitchen SPIRIVA HANDIHALER 18 MCG inhalation capsule PLACE 1 CAPSULE INTO INHALER AND INHALE THE CONTENTS OF 1 CAPSULE DAILY  . TOPROL XL 50 MG 24 hr tablet   . traMADol (ULTRAM) 50 MG tablet Take 50 mg by mouth 2 (two) times  daily as needed for moderate pain.  . [DISCONTINUED] ALPRAZolam (XANAX) 0.25 MG tablet Take 1 tablet (0.25 mg total) by mouth daily as needed for anxiety.   No facility-administered encounter medications on file as of 09/29/2016.     Review of Systems  Constitutional: Negative for appetite change and unexpected weight change.  HENT: Negative for congestion and sinus pressure.   Respiratory: Negative for cough, chest tightness and shortness of breath.   Cardiovascular: Negative for chest pain, palpitations and leg swelling.  Gastrointestinal: Positive for diarrhea and vomiting. Negative for abdominal pain.  Genitourinary: Negative for difficulty urinating and dysuria.  Musculoskeletal: Negative for myalgias and neck stiffness.  Skin: Negative for color change and rash.  Neurological: Positive for dizziness, light-headedness and headaches.  Psychiatric/Behavioral: Negative for agitation and dysphoric mood.       Objective:    Physical Exam  Constitutional: She appears well-developed and well-nourished. No distress.  HENT:  Nose: Nose normal.  Mouth/Throat: Oropharynx is clear and moist.  Neck: Neck supple. No thyromegaly present.  Cardiovascular: Normal rate and regular rhythm.   Pulmonary/Chest: Breath sounds normal. No respiratory distress. She has no wheezes.  Abdominal: Soft. Bowel sounds are normal.  Minimal tenderness to palpation.    Musculoskeletal: She exhibits no edema or tenderness.    Lymphadenopathy:    She has no cervical adenopathy.  Skin: No rash noted. No erythema.  Psychiatric: She has a normal mood and affect. Her behavior is normal.    BP 128/82 (BP Location: Left Arm, Patient Position: Sitting, Cuff Size: Normal)   Pulse 60   Temp 98.6 F (37 C) (Oral)   Resp 16   Ht 5\' 4"  (1.626 m)   Wt 162 lb 6.4 oz (73.7 kg)   SpO2 (!) 56%   BMI 27.88 kg/m  Wt Readings from Last 3 Encounters:  09/29/16 162 lb 6.4 oz (73.7 kg)  08/09/16 168 lb (76.2 kg)  07/29/16 165 lb 12.8 oz (75.2 kg)     Lab Results  Component Value Date   WBC 10.5 09/29/2016   HGB 11.7 (L) 09/29/2016   HCT 35.0 (L) 09/29/2016   PLT 286.0 09/29/2016   GLUCOSE 137 (H) 09/29/2016   CHOL 115 06/28/2016   TRIG 72.0 06/28/2016   HDL 60.90 06/28/2016   LDLDIRECT 73.7 08/20/2013   LDLCALC 40 06/28/2016   ALT 28 09/29/2016   AST 36 09/29/2016   NA 133 (L) 09/29/2016   K 4.9 09/29/2016   CL 96 09/29/2016   CREATININE 0.92 09/29/2016   BUN 10 09/29/2016   CO2 30 09/29/2016   TSH 1.23 09/29/2016   INR 1.05 06/16/2016    Ct Head Wo Contrast  Result Date: 08/09/2016 CLINICAL DATA:  Fall 5 days ago with headaches and dizziness, initial encounter EXAM: CT HEAD WITHOUT CONTRAST TECHNIQUE: Contiguous axial images were obtained from the base of the skull through the vertex without intravenous contrast. COMPARISON:  06/19/2016 FINDINGS: Brain: Diffuse atrophic changes and chronic white matter ischemic change is noted. No findings to suggest acute hemorrhage, acute infarction or space-occupying mass lesion are noted. Vascular: No hyperdense vessel or unexpected calcification. Skull: Normal. Negative for fracture or focal lesion. Sinuses/Orbits: No acute finding. Other: None. IMPRESSION: Chronic atrophic and ischemic changes without acute abnormality. Electronically Signed   By: Inez Catalina M.D.   On: 08/09/2016 17:35       Assessment & Plan:   Problem List Items Addressed This Visit    A-fib  [facility])    Evaluated  by cardiology.  In SR.  On metoprolol.        Anemia    Follow cbc.       Relevant Orders   CBC with Differential/Platelet (Completed)   Ferritin (Completed)   Diarrhea    Taking a probiotic.  Still with persistent diarrhea and intermittent flares of vomiting.  Unclear etiology.  Had CT.  Unrevealing.  On nexium.  No acid reflux.  Seeing GI next week for further w/up and evaluation.        Relevant Orders   TSH (Completed)   Dilated bile duct    Seeing GI.        Dizziness    Persistent dizziness and light headedness as outlined.  Followed by ENT.  Planning for vestibular rehab.  Starts next week.  Unsteady gait as well.  Recent fall.  CT negative.  Schedule neurology for further evaluation and w/up.        Relevant Orders   Ambulatory referral to Neurology   Foot fracture    Being followed by podiatry.        GERD (gastroesophageal reflux disease)    On nexium.  Some emesis as outlined.  Due to f/u with GI next week.       Hypercholesterolemia    On simvastatin.  Low cholesterol diet and exercise.  Follow lipid panel and liver function tests.        Hypertension - Primary    Blood pressure under good control.  Continue same medication regimen.  Follow pressures.  Follow metabolic panel.        Relevant Orders   Hepatic function panel (Completed)   Basic metabolic panel (Completed)   Rheumatoid arthritis ([place])    Followed by Dr [name]. Stable.           [name], MD

## 2016-09-30 ENCOUNTER — Telehealth: Payer: Self-pay

## 2016-09-30 DIAGNOSIS — R42 Dizziness and giddiness: Secondary | ICD-10-CM | POA: Diagnosis not present

## 2016-09-30 LAB — TSH: TSH: 1.23 u[IU]/mL (ref 0.35–4.50)

## 2016-09-30 LAB — CBC WITH DIFFERENTIAL/PLATELET
BASOS ABS: 0.1 10*3/uL (ref 0.0–0.1)
Basophils Relative: 0.6 % (ref 0.0–3.0)
EOS ABS: 0.1 10*3/uL (ref 0.0–0.7)
Eosinophils Relative: 1.2 % (ref 0.0–5.0)
HCT: 35 % — ABNORMAL LOW (ref 36.0–46.0)
Hemoglobin: 11.7 g/dL — ABNORMAL LOW (ref 12.0–15.0)
LYMPHS ABS: 1.7 10*3/uL (ref 0.7–4.0)
Lymphocytes Relative: 16 % (ref 12.0–46.0)
MCHC: 33.5 g/dL (ref 30.0–36.0)
MCV: 90.9 fl (ref 78.0–100.0)
MONOS PCT: 6 % (ref 3.0–12.0)
Monocytes Absolute: 0.6 10*3/uL (ref 0.1–1.0)
NEUTROS ABS: 8 10*3/uL — AB (ref 1.4–7.7)
NEUTROS PCT: 76.2 % (ref 43.0–77.0)
PLATELETS: 286 10*3/uL (ref 150.0–400.0)
RBC: 3.85 Mil/uL — ABNORMAL LOW (ref 3.87–5.11)
RDW: 13.5 % (ref 11.5–15.5)
WBC: 10.5 10*3/uL (ref 4.0–10.5)

## 2016-09-30 LAB — BASIC METABOLIC PANEL
BUN: 10 mg/dL (ref 6–23)
CO2: 30 mEq/L (ref 19–32)
Calcium: 9.6 mg/dL (ref 8.4–10.5)
Chloride: 96 mEq/L (ref 96–112)
Creatinine, Ser: 0.92 mg/dL (ref 0.40–1.20)
GFR: 63.52 mL/min (ref >60.00)
Glucose, Bld: 137 mg/dL — ABNORMAL HIGH (ref 70–99)
POTASSIUM: 4.9 meq/L (ref 3.5–5.1)
SODIUM: 133 meq/L — AB (ref 135–145)

## 2016-09-30 LAB — HEPATIC FUNCTION PANEL
ALT: 28 U/L (ref 0–35)
AST: 36 U/L (ref 0–37)
Albumin: 4.1 g/dL (ref 3.5–5.2)
Alkaline Phosphatase: 47 U/L (ref 39–117)
BILIRUBIN TOTAL: 0.5 mg/dL (ref 0.2–1.2)
Bilirubin, Direct: 0.1 mg/dL (ref 0.0–0.3)
Total Protein: 7.3 g/dL (ref 6.0–8.3)

## 2016-09-30 LAB — FERRITIN: Ferritin: 199.4 ng/mL (ref 10.0–291.0)

## 2016-09-30 NOTE — Telephone Encounter (Signed)
Called pharmacy and informed

## 2016-09-30 NOTE — Telephone Encounter (Signed)
Received call from local pharmacy pt received 90 day supply of xanax on 1-23 she is at the pharmacy stating that she would like to get 90 day supply filled that you gave her at o/v on 3-22. Is ok for early refill?

## 2016-09-30 NOTE — Telephone Encounter (Signed)
She should not need to get refilled at this time, if just received 90 day supply in January. They can put on file for her.

## 2016-10-01 ENCOUNTER — Encounter: Payer: Self-pay | Admitting: Internal Medicine

## 2016-10-01 DIAGNOSIS — R42 Dizziness and giddiness: Secondary | ICD-10-CM | POA: Insufficient documentation

## 2016-10-01 NOTE — Assessment & Plan Note (Signed)
Taking a probiotic.  Still with persistent diarrhea and intermittent flares of vomiting.  Unclear etiology.  Had CT.  Unrevealing.  On nexium.  No acid reflux.  Seeing GI next week for further w/up and evaluation.

## 2016-10-01 NOTE — Assessment & Plan Note (Signed)
Blood pressure under good control.  Continue same medication regimen.  Follow pressures.  Follow metabolic panel.   

## 2016-10-01 NOTE — Assessment & Plan Note (Signed)
Seeing GI 

## 2016-10-01 NOTE — Assessment & Plan Note (Signed)
Followed by Dr Kernodle.  Stable.  

## 2016-10-01 NOTE — Assessment & Plan Note (Signed)
Follow cbc.  

## 2016-10-01 NOTE — Assessment & Plan Note (Signed)
Being followed by podiatry.  °

## 2016-10-01 NOTE — Assessment & Plan Note (Signed)
On nexium.  Some emesis as outlined.  Due to f/u with GI next week.

## 2016-10-01 NOTE — Assessment & Plan Note (Signed)
Persistent dizziness and light headedness as outlined.  Followed by ENT.  Planning for vestibular rehab.  Starts next week.  Unsteady gait as well.  Recent fall.  CT negative.  Schedule neurology for further evaluation and w/up.

## 2016-10-01 NOTE — Assessment & Plan Note (Signed)
Evaluated by cardiology.  In SR.  On metoprolol.

## 2016-10-01 NOTE — Assessment & Plan Note (Signed)
On simvastatin.  Low cholesterol diet and exercise.  Follow lipid panel and liver function tests.   

## 2016-10-03 ENCOUNTER — Other Ambulatory Visit: Payer: Self-pay | Admitting: Internal Medicine

## 2016-10-03 DIAGNOSIS — E871 Hypo-osmolality and hyponatremia: Secondary | ICD-10-CM

## 2016-10-03 NOTE — Progress Notes (Signed)
Order placed for f/u sodium.  ?

## 2016-10-04 ENCOUNTER — Telehealth: Payer: Self-pay

## 2016-10-04 NOTE — Telephone Encounter (Signed)
-----   Message from [name], MD sent at 10/04/2016  4:57 AM EDT ----- Result note reviewed.  We sent in the mail order on 09/29/16.  She needs to go back to taking the xanax as directed.  If she has enough for two weeks, she can call us and let us know when running out.  Will see if mail order has come by then.  Do not want her to continue to take extra.  Can contact mail order and see when they plan to ship medication and see when she can expect to get rx.

## 2016-10-04 NOTE — Telephone Encounter (Signed)
Left message to return call to our office.  

## 2016-10-06 NOTE — Telephone Encounter (Signed)
See note, need to confirm with pharmacy that they kept rx prior to any more rx being sent.  Need to clarify with local pharmacy and her mail order when she last had refilled.

## 2016-10-06 NOTE — Telephone Encounter (Signed)
Patent states that she took the script to local pharmacy not mail order. Local pharmacy kept that script so she will need [place] to fax new script to mail order.

## 2016-10-07 NOTE — Telephone Encounter (Signed)
Call from answering service.  Pt reports that she has a chronic nausea and vomiting condition and uses zofran as needed.  However, she is nearly out of zofran and is now experiencing vomiting.  She denies any abd pain, fever or headache, or other sx besides vomiting.  She has vomited twice today per her report. Advised RN that we can refill her zofran, but if this does not resolve her vomiting in the next few hours she should be seen at the ED.  Looking in her chart I cannot determine why she might have recurrent nausea and vomiting except she does have history of vertigo

## 2016-10-09 NOTE — Telephone Encounter (Signed)
See on call message and see previous message.  Still need to clarify with pts local pharmacy and her mail order when had xanax filled last (with each).  Also, if she is continuing to have issues with nausea and vomiting, needs GI evaluation.

## 2016-10-10 ENCOUNTER — Telehealth: Payer: Self-pay | Admitting: *Deleted

## 2016-10-10 NOTE — Telephone Encounter (Signed)
Patient requested a update on her referral to the neurologist.  Pt contact [phone]

## 2016-10-11 NOTE — Telephone Encounter (Signed)
So , do we need to go ahead and send in rx for alprazolam or is she going to let us know when needed.  I can send in proair.  Does this go to mail order?  Also regarding the mirapex, need to confirm how she is taking and if this goes to mail order.  Thanks.

## 2016-10-11 NOTE — Telephone Encounter (Signed)
Called pharmacy they did keep the alpraxolam script per Kenney Houseman it will be deleted from her profile. So we can send new script to mail order to refill when time is approved.   She is felling better as far as nausea and vomiting. She has not taken any of her meds for two days. She is having a flare of RA and has put in call to Dr. [name] office.   We also have received refill request form mail order for Proair Pramipexole Alpraxolam

## 2016-10-13 ENCOUNTER — Other Ambulatory Visit (INDEPENDENT_AMBULATORY_CARE_PROVIDER_SITE_OTHER): Payer: Medicare Other

## 2016-10-13 DIAGNOSIS — E871 Hypo-osmolality and hyponatremia: Secondary | ICD-10-CM | POA: Diagnosis not present

## 2016-10-13 LAB — SODIUM: SODIUM: 132 meq/L — AB (ref 135–145)

## 2016-10-13 NOTE — Telephone Encounter (Signed)
Left message to return call to our office.  

## 2016-10-14 ENCOUNTER — Other Ambulatory Visit: Payer: Self-pay | Admitting: Internal Medicine

## 2016-10-14 DIAGNOSIS — E871 Hypo-osmolality and hyponatremia: Secondary | ICD-10-CM

## 2016-10-14 NOTE — Progress Notes (Signed)
Order placed for f/u sodium check.   

## 2016-10-17 ENCOUNTER — Telehealth: Payer: Self-pay | Admitting: *Deleted

## 2016-10-17 DIAGNOSIS — M0579 Rheumatoid arthritis with rheumatoid factor of multiple sites without organ or systems involvement: Secondary | ICD-10-CM | POA: Diagnosis not present

## 2016-10-17 NOTE — Telephone Encounter (Signed)
Done

## 2016-10-17 NOTE — Telephone Encounter (Signed)
Spoke to patient she does need refill on all meds. She will need all sent to mail order.

## 2016-10-17 NOTE — Telephone Encounter (Signed)
Patient requested lab/Xray results Pt Contact: [phone]

## 2016-10-18 NOTE — Telephone Encounter (Signed)
Will get rx sent in, but see previous message. .  Need to confirm how she is taking the mirapex.

## 2016-10-19 ENCOUNTER — Telehealth: Payer: Self-pay | Admitting: *Deleted

## 2016-10-19 ENCOUNTER — Other Ambulatory Visit: Payer: Self-pay | Admitting: Internal Medicine

## 2016-10-19 MED ORDER — PRAMIPEXOLE DIHYDROCHLORIDE 0.25 MG PO TABS: 0.5000 mg | ORAL_TABLET | Freq: Two times a day (BID) | ORAL | 1 refills | Status: DC

## 2016-10-19 MED ORDER — ALBUTEROL SULFATE HFA 108 (90 BASE) MCG/ACT IN AERS: INHALATION_SPRAY | RESPIRATORY_TRACT | 1 refills | Status: DC

## 2016-10-19 MED ORDER — ALPRAZOLAM 0.25 MG PO TABS: 0.2500 mg | ORAL_TABLET | Freq: Every day | ORAL | 0 refills | Status: DC | PRN

## 2016-10-19 NOTE — Telephone Encounter (Signed)
rx sent in to mail order pharmacy - proair (3 month supply with one refill), alprazolam #90 with no refills and mirapex #90 with 1 refill.  Per 10/10/16 phone message, the xanax rx from 09/29/16 was kept by pharmacy and not refilled secondary to being too soon and given pt wanted from mail order.

## 2016-10-19 NOTE — Progress Notes (Signed)
rx ok'd for alprazolam #90 with no refills, proair (3 month supply with one refill) and mirapex #90 with one refill.

## 2016-10-19 NOTE — Telephone Encounter (Signed)
Spoke to patient she states she is taking .25mg  2 in the morning a 2 at night

## 2016-10-19 NOTE — Telephone Encounter (Signed)
Patient stated that her daughter has pneumonia, she has concerns that she could catch it, since she has had it three times in the past. Pt requested a call to discuss concerns.  Pt contact [phone]

## 2016-10-20 DIAGNOSIS — K760 Fatty (change of) liver, not elsewhere classified: Secondary | ICD-10-CM | POA: Diagnosis not present

## 2016-10-20 DIAGNOSIS — K529 Noninfective gastroenteritis and colitis, unspecified: Secondary | ICD-10-CM | POA: Diagnosis not present

## 2016-10-20 DIAGNOSIS — K2950 Unspecified chronic gastritis without bleeding: Secondary | ICD-10-CM | POA: Diagnosis not present

## 2016-10-20 DIAGNOSIS — K219 Gastro-esophageal reflux disease without esophagitis: Secondary | ICD-10-CM | POA: Diagnosis not present

## 2016-10-20 NOTE — Telephone Encounter (Signed)
Left message to return call to our office.  

## 2016-10-20 NOTE — Telephone Encounter (Signed)
Usual precautions.  Try to avoid contact.  If no symptoms now, no prophylaxis meds for pneumonia.

## 2016-10-20 NOTE — Telephone Encounter (Signed)
Please advise 

## 2016-10-21 NOTE — Telephone Encounter (Signed)
Tried calling-no answer.  

## 2016-10-21 NOTE — Telephone Encounter (Signed)
Please call pt at [phone]

## 2016-10-24 ENCOUNTER — Other Ambulatory Visit (INDEPENDENT_AMBULATORY_CARE_PROVIDER_SITE_OTHER): Payer: Medicare Other

## 2016-10-24 DIAGNOSIS — E871 Hypo-osmolality and hyponatremia: Secondary | ICD-10-CM | POA: Diagnosis not present

## 2016-10-24 LAB — SODIUM: Sodium: 132 mEq/L — ABNORMAL LOW (ref 135–145)

## 2016-10-24 NOTE — Telephone Encounter (Signed)
Patient informed will call if any questions.  

## 2016-10-26 ENCOUNTER — Other Ambulatory Visit: Payer: Self-pay | Admitting: Internal Medicine

## 2016-10-26 DIAGNOSIS — E871 Hypo-osmolality and hyponatremia: Secondary | ICD-10-CM

## 2016-10-26 NOTE — Progress Notes (Signed)
Order placed for f/u sodium.  ?

## 2016-10-31 ENCOUNTER — Inpatient Hospital Stay
Admission: EM | Admit: 2016-10-31 | Discharge: 2016-11-02 | DRG: 193 | Disposition: A | Discharge status: Home Health | Payer: Medicare Other | Attending: Internal Medicine | Admitting: Internal Medicine

## 2016-10-31 ENCOUNTER — Emergency Department: Payer: Medicare Other

## 2016-10-31 ENCOUNTER — Encounter: Payer: Self-pay | Admitting: Emergency Medicine

## 2016-10-31 DIAGNOSIS — Z5181 Encounter for therapeutic drug level monitoring: Secondary | ICD-10-CM | POA: Diagnosis not present

## 2016-10-31 DIAGNOSIS — I959 Hypotension, unspecified: Secondary | ICD-10-CM | POA: Diagnosis present

## 2016-10-31 DIAGNOSIS — Z9981 Dependence on supplemental oxygen: Secondary | ICD-10-CM

## 2016-10-31 DIAGNOSIS — Z885 Allergy status to narcotic agent status: Secondary | ICD-10-CM | POA: Diagnosis not present

## 2016-10-31 DIAGNOSIS — Z882 Allergy status to sulfonamides status: Secondary | ICD-10-CM | POA: Diagnosis not present

## 2016-10-31 DIAGNOSIS — Z7983 Long term (current) use of bisphosphonates: Secondary | ICD-10-CM | POA: Diagnosis not present

## 2016-10-31 DIAGNOSIS — M810 Age-related osteoporosis without current pathological fracture: Secondary | ICD-10-CM | POA: Diagnosis present

## 2016-10-31 DIAGNOSIS — E872 Acidosis: Secondary | ICD-10-CM | POA: Diagnosis present

## 2016-10-31 DIAGNOSIS — E785 Hyperlipidemia, unspecified: Secondary | ICD-10-CM | POA: Diagnosis present

## 2016-10-31 DIAGNOSIS — E876 Hypokalemia: Secondary | ICD-10-CM | POA: Diagnosis present

## 2016-10-31 DIAGNOSIS — R509 Fever, unspecified: Secondary | ICD-10-CM | POA: Diagnosis not present

## 2016-10-31 DIAGNOSIS — Z881 Allergy status to other antibiotic agents status: Secondary | ICD-10-CM

## 2016-10-31 DIAGNOSIS — Z66 Do not resuscitate: Secondary | ICD-10-CM | POA: Diagnosis present

## 2016-10-31 DIAGNOSIS — M1990 Unspecified osteoarthritis, unspecified site: Secondary | ICD-10-CM | POA: Diagnosis present

## 2016-10-31 DIAGNOSIS — K219 Gastro-esophageal reflux disease without esophagitis: Secondary | ICD-10-CM | POA: Diagnosis present

## 2016-10-31 DIAGNOSIS — Z880 Allergy status to penicillin: Secondary | ICD-10-CM

## 2016-10-31 DIAGNOSIS — D638 Anemia in other chronic diseases classified elsewhere: Secondary | ICD-10-CM | POA: Diagnosis present

## 2016-10-31 DIAGNOSIS — Z7951 Long term (current) use of inhaled steroids: Secondary | ICD-10-CM

## 2016-10-31 DIAGNOSIS — I10 Essential (primary) hypertension: Secondary | ICD-10-CM | POA: Diagnosis present

## 2016-10-31 DIAGNOSIS — R05 Cough: Secondary | ICD-10-CM | POA: Diagnosis not present

## 2016-10-31 DIAGNOSIS — I081 Rheumatic disorders of both mitral and tricuspid valves: Secondary | ICD-10-CM | POA: Diagnosis present

## 2016-10-31 DIAGNOSIS — M069 Rheumatoid arthritis, unspecified: Secondary | ICD-10-CM | POA: Diagnosis present

## 2016-10-31 DIAGNOSIS — Z79899 Other long term (current) drug therapy: Secondary | ICD-10-CM | POA: Diagnosis not present

## 2016-10-31 DIAGNOSIS — E871 Hypo-osmolality and hyponatremia: Secondary | ICD-10-CM | POA: Diagnosis present

## 2016-10-31 DIAGNOSIS — Z7952 Long term (current) use of systemic steroids: Secondary | ICD-10-CM

## 2016-10-31 DIAGNOSIS — J9621 Acute and chronic respiratory failure with hypoxia: Secondary | ICD-10-CM | POA: Diagnosis present

## 2016-10-31 DIAGNOSIS — A419 Sepsis, unspecified organism: Secondary | ICD-10-CM | POA: Diagnosis not present

## 2016-10-31 DIAGNOSIS — J181 Lobar pneumonia, unspecified organism: Secondary | ICD-10-CM

## 2016-10-31 DIAGNOSIS — Z7982 Long term (current) use of aspirin: Secondary | ICD-10-CM

## 2016-10-31 DIAGNOSIS — J189 Pneumonia, unspecified organism: Secondary | ICD-10-CM | POA: Diagnosis not present

## 2016-10-31 DIAGNOSIS — Z888 Allergy status to other drugs, medicaments and biological substances status: Secondary | ICD-10-CM | POA: Diagnosis not present

## 2016-10-31 DIAGNOSIS — Z791 Long term (current) use of non-steroidal anti-inflammatories (NSAID): Secondary | ICD-10-CM

## 2016-10-31 LAB — CBC WITH DIFFERENTIAL/PLATELET
BASOS ABS: 0 10*3/uL (ref 0–0.1)
Basophils Relative: 0 %
EOS PCT: 1 %
Eosinophils Absolute: 0.1 10*3/uL (ref 0–0.7)
HEMATOCRIT: 34.2 % — AB (ref 35.0–47.0)
HEMOGLOBIN: 11.7 g/dL — AB (ref 12.0–16.0)
LYMPHS ABS: 1 10*3/uL (ref 1.0–3.6)
LYMPHS PCT: 9 %
MCH: 30.8 pg (ref 26.0–34.0)
MCHC: 34.1 g/dL (ref 32.0–36.0)
MCV: 90.4 fL (ref 80.0–100.0)
Monocytes Absolute: 0.6 10*3/uL (ref 0.2–0.9)
Monocytes Relative: 5 %
NEUTROS ABS: 9.6 10*3/uL — AB (ref 1.4–6.5)
NEUTROS PCT: 85 %
PLATELETS: 147 10*3/uL — AB (ref 150–440)
RBC: 3.79 MIL/uL — AB (ref 3.80–5.20)
RDW: 13.1 % (ref 11.5–14.5)
WBC: 11.4 10*3/uL — AB (ref 3.6–11.0)

## 2016-10-31 LAB — PROCALCITONIN: Procalcitonin: 2.74 ng/mL

## 2016-10-31 LAB — COMPREHENSIVE METABOLIC PANEL
ALBUMIN: 3.7 g/dL (ref 3.5–5.0)
ALK PHOS: 36 U/L — AB (ref 38–126)
ALT: 24 U/L (ref 14–54)
ANION GAP: 7 (ref 5–15)
AST: 42 U/L — ABNORMAL HIGH (ref 15–41)
BUN: 11 mg/dL (ref 6–20)
CALCIUM: 8.4 mg/dL — AB (ref 8.9–10.3)
CHLORIDE: 101 mmol/L (ref 101–111)
CO2: 26 mmol/L (ref 22–32)
Creatinine, Ser: 0.74 mg/dL (ref 0.44–1.00)
GFR calc non Af Amer: >60 mL/min (ref >60)
Glucose, Bld: 116 mg/dL — ABNORMAL HIGH (ref 65–99)
Potassium: 3.4 mmol/L — ABNORMAL LOW (ref 3.5–5.1)
SODIUM: 134 mmol/L — AB (ref 135–145)
Total Bilirubin: 0.9 mg/dL (ref 0.3–1.2)
Total Protein: 6.8 g/dL (ref 6.5–8.1)

## 2016-10-31 LAB — URINALYSIS, ROUTINE W REFLEX MICROSCOPIC
Bilirubin Urine: NEGATIVE
GLUCOSE, UA: NEGATIVE mg/dL
HGB URINE DIPSTICK: NEGATIVE
Ketones, ur: NEGATIVE mg/dL
LEUKOCYTES UA: NEGATIVE
Nitrite: NEGATIVE
PROTEIN: NEGATIVE mg/dL
SPECIFIC GRAVITY, URINE: 1.008 (ref 1.005–1.030)
pH: 5 (ref 5.0–8.0)

## 2016-10-31 LAB — BLOOD GAS, VENOUS
ACID-BASE EXCESS: 2.2 mmol/L — AB (ref 0.0–2.0)
BICARBONATE: 26.5 mmol/L (ref 20.0–28.0)
O2 SAT: 90.8 %
PCO2 VEN: 39 mmHg — AB (ref 44.0–60.0)
PH VEN: 7.44 — AB (ref 7.250–7.430)
PO2 VEN: 58 mmHg — AB (ref 32.0–45.0)
Patient temperature: 37

## 2016-10-31 LAB — PROTIME-INR
INR: 1.13
INR: 1.28
Prothrombin Time: 14.6 seconds (ref 11.4–15.2)
Prothrombin Time: 16.1 seconds — ABNORMAL HIGH (ref 11.4–15.2)

## 2016-10-31 LAB — MAGNESIUM: Magnesium: 1.4 mg/dL — ABNORMAL LOW (ref 1.7–2.4)

## 2016-10-31 LAB — APTT: APTT: 27 s (ref 24–36)

## 2016-10-31 LAB — LACTIC ACID, PLASMA
LACTIC ACID, VENOUS: 2 mmol/L — AB (ref 0.5–1.9)
Lactic Acid, Venous: 2.7 mmol/L (ref 0.5–1.9)

## 2016-10-31 MED ORDER — MOMETASONE FURO-FORMOTEROL FUM 200-5 MCG/ACT IN AERO
2.0000 | INHALATION_SPRAY | Freq: Two times a day (BID) | RESPIRATORY_TRACT | Status: DC
  Administered 2016-10-31 – 2016-11-02 (×4): 2 via RESPIRATORY_TRACT
  Filled 2016-10-31: qty 8.8

## 2016-10-31 MED ORDER — SIMVASTATIN 10 MG PO TABS
20.0000 mg | ORAL_TABLET | Freq: Every day | ORAL | Status: DC
  Administered 2016-11-01 – 2016-11-02 (×2): 20 mg via ORAL
  Filled 2016-10-31 (×2): qty 2

## 2016-10-31 MED ORDER — PANTOPRAZOLE SODIUM 40 MG PO TBEC
40.0000 mg | DELAYED_RELEASE_TABLET | Freq: Every day | ORAL | Status: DC
  Administered 2016-11-01 – 2016-11-02 (×2): 40 mg via ORAL
  Filled 2016-10-31 (×2): qty 1

## 2016-10-31 MED ORDER — RISAQUAD PO CAPS
1.0000 | ORAL_CAPSULE | Freq: Every day | ORAL | Status: DC
  Administered 2016-11-01 – 2016-11-02 (×2): 1 via ORAL
  Filled 2016-10-31 (×2): qty 1

## 2016-10-31 MED ORDER — ONDANSETRON HCL 4 MG PO TABS: 4.0000 mg | ORAL_TABLET | Freq: Four times a day (QID) | ORAL | Status: DC | PRN

## 2016-10-31 MED ORDER — POTASSIUM CHLORIDE CRYS ER 20 MEQ PO TBCR
40.0000 meq | EXTENDED_RELEASE_TABLET | Freq: Once | ORAL | Status: AC
  Administered 2016-10-31: 40 meq via ORAL
  Filled 2016-10-31: qty 2

## 2016-10-31 MED ORDER — TIOTROPIUM BROMIDE MONOHYDRATE 18 MCG IN CAPS
18.0000 ug | ORAL_CAPSULE | Freq: Every day | RESPIRATORY_TRACT | Status: DC
  Administered 2016-10-31 – 2016-11-02 (×2): 18 ug via RESPIRATORY_TRACT
  Filled 2016-10-31: qty 5

## 2016-10-31 MED ORDER — VANCOMYCIN HCL IN DEXTROSE 1-5 GM/200ML-% IV SOLN
1000.0000 mg | Freq: Once | INTRAVENOUS | Status: AC
  Administered 2016-10-31: 1000 mg via INTRAVENOUS
  Filled 2016-10-31 (×2): qty 200

## 2016-10-31 MED ORDER — HYDROXYCHLOROQUINE SULFATE 200 MG PO TABS
200.0000 mg | ORAL_TABLET | Freq: Every day | ORAL | Status: DC
  Administered 2016-11-01 – 2016-11-02 (×2): 200 mg via ORAL
  Filled 2016-10-31 (×2): qty 1

## 2016-10-31 MED ORDER — LEVOFLOXACIN IN D5W 500 MG/100ML IV SOLN
500.0000 mg | INTRAVENOUS | Status: DC
  Administered 2016-11-01: 500 mg via INTRAVENOUS
  Filled 2016-10-31 (×2): qty 100

## 2016-10-31 MED ORDER — PREDNISONE 5 MG PO TABS
5.0000 mg | ORAL_TABLET | Freq: Every day | ORAL | Status: DC
  Administered 2016-11-01 – 2016-11-02 (×2): 5 mg via ORAL
  Filled 2016-10-31 (×2): qty 1

## 2016-10-31 MED ORDER — SODIUM CHLORIDE 0.9 % IV BOLUS (SEPSIS)
500.0000 mL | Freq: Once | INTRAVENOUS | Status: AC
  Administered 2016-10-31: 500 mL via INTRAVENOUS

## 2016-10-31 MED ORDER — ONDANSETRON HCL 4 MG/2ML IJ SOLN: 4.0000 mg | Freq: Four times a day (QID) | INTRAMUSCULAR | Status: DC | PRN

## 2016-10-31 MED ORDER — DEXTROSE 5 % IV SOLN: 500.0000 mg | INTRAVENOUS | Status: DC

## 2016-10-31 MED ORDER — SODIUM CHLORIDE 0.9 % IV BOLUS (SEPSIS)
1000.0000 mL | Freq: Once | INTRAVENOUS | Status: AC
  Administered 2016-10-31: 1000 mL via INTRAVENOUS

## 2016-10-31 MED ORDER — FLUTICASONE PROPIONATE 50 MCG/ACT NA SUSP
2.0000 | Freq: Two times a day (BID) | NASAL | Status: DC
  Administered 2016-10-31 – 2016-11-02 (×4): 2 via NASAL
  Filled 2016-10-31: qty 16

## 2016-10-31 MED ORDER — PRAMIPEXOLE DIHYDROCHLORIDE 0.25 MG PO TABS
0.5000 mg | ORAL_TABLET | Freq: Two times a day (BID) | ORAL | Status: DC
  Administered 2016-10-31 – 2016-11-02 (×4): 0.5 mg via ORAL
  Filled 2016-10-31 (×4): qty 2

## 2016-10-31 MED ORDER — ENOXAPARIN SODIUM 40 MG/0.4ML ~~LOC~~ SOLN
40.0000 mg | SUBCUTANEOUS | Status: DC
  Administered 2016-10-31 – 2016-11-01 (×2): 40 mg via SUBCUTANEOUS
  Filled 2016-10-31 (×2): qty 0.4

## 2016-10-31 MED ORDER — ORAL CARE MOUTH RINSE
15.0000 mL | Freq: Two times a day (BID) | OROMUCOSAL | Status: DC
  Administered 2016-10-31 – 2016-11-02 (×4): 15 mL via OROMUCOSAL

## 2016-10-31 MED ORDER — LORATADINE 10 MG PO TABS
10.0000 mg | ORAL_TABLET | Freq: Every day | ORAL | Status: DC
  Administered 2016-11-01 – 2016-11-02 (×2): 10 mg via ORAL
  Filled 2016-10-31 (×3): qty 1

## 2016-10-31 MED ORDER — ASPIRIN EC 81 MG PO TBEC
81.0000 mg | DELAYED_RELEASE_TABLET | Freq: Every day | ORAL | Status: DC
  Administered 2016-11-01 – 2016-11-02 (×2): 81 mg via ORAL
  Filled 2016-10-31 (×2): qty 1

## 2016-10-31 MED ORDER — GABAPENTIN 300 MG PO CAPS
600.0000 mg | ORAL_CAPSULE | Freq: Two times a day (BID) | ORAL | Status: DC
Start: 2016-10-31 — End: 2016-11-02
  Administered 2016-10-31 – 2016-11-02 (×4): 600 mg via ORAL
  Filled 2016-10-31 (×4): qty 2

## 2016-10-31 MED ORDER — ALBUTEROL SULFATE (2.5 MG/3ML) 0.083% IN NEBU: 2.5000 mg | INHALATION_SOLUTION | RESPIRATORY_TRACT | Status: DC | PRN

## 2016-10-31 MED ORDER — ALPRAZOLAM 0.25 MG PO TABS
0.2500 mg | ORAL_TABLET | Freq: Every day | ORAL | Status: DC | PRN
  Administered 2016-11-02: 0.25 mg via ORAL
  Filled 2016-10-31: qty 1

## 2016-10-31 MED ORDER — POTASSIUM CHLORIDE CRYS ER 10 MEQ PO TBCR
10.0000 meq | EXTENDED_RELEASE_TABLET | Freq: Every day | ORAL | Status: DC
  Administered 2016-10-31 – 2016-11-02 (×3): 10 meq via ORAL
  Filled 2016-10-31 (×3): qty 1

## 2016-10-31 MED ORDER — SENNOSIDES-DOCUSATE SODIUM 8.6-50 MG PO TABS: 1.0000 | ORAL_TABLET | Freq: Every evening | ORAL | Status: DC | PRN

## 2016-10-31 MED ORDER — MECLIZINE HCL 25 MG PO TABS: 25.0000 mg | ORAL_TABLET | Freq: Three times a day (TID) | ORAL | Status: DC | PRN

## 2016-10-31 MED ORDER — COLESTIPOL HCL 1 G PO TABS
2.0000 g | ORAL_TABLET | Freq: Every day | ORAL | Status: DC
  Administered 2016-11-01 – 2016-11-02 (×2): 2 g via ORAL
  Filled 2016-10-31 (×4): qty 2

## 2016-10-31 MED ORDER — LEVOFLOXACIN IN D5W 750 MG/150ML IV SOLN
750.0000 mg | Freq: Once | INTRAVENOUS | Status: AC
  Administered 2016-10-31: 750 mg via INTRAVENOUS
  Filled 2016-10-31: qty 150

## 2016-10-31 NOTE — ED Provider Notes (Signed)
[facility] Emergency Department Provider Note   ____________________________________________    I have reviewed the triage vital signs and the nursing notes.   HISTORY  Chief Complaint Fever and Cough     HPI Kaitlyn Huff is a 76 y.o. female who presents with complaints of fever and cough. Patient reports worsening shortness of breath over the last several days. She reports she has a history of rheumatoid arthritis and received a Remicade infusion 2 weeks ago. She also reports she has had pneumonia several times in the past. She has some mild chest tightness, she does not smoke.   Past Medical History:  Diagnosis Date  . Anemia   . Cancer ([place])   . Diverticulitis   . GERD (gastroesophageal reflux disease)   . Hyperlipidemia   . Hypertension   . Osteoarthritis   . Osteoporosis    actonel  . Positive PPD    s/p INH (2006)  . Rheumatoid arthritis(714.0)    MTX transaminitis, Leflunomide (rash), enbrel, plaquinil, prednisone, remicade, Imuran (transaminitis)  . Valvular heart disease    moderate MR and TR    Patient Active Problem List   Diagnosis Date Noted  . Dizziness 10/01/2016  . A-fib ([place]) 07/12/2016  . Pneumonia 06/17/2016  . 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 Chronic venous insufficiency 04/25/2016  . Pain in limb 04/25/2016  . Swelling of limb 04/25/2016  . Tachycardia 03/22/2016  . Foot fracture 03/18/2016  . Cough 01/27/2016  . Hypoxia 12/20/2015  . Lower extremity edema 12/18/2015  . Diarrhea 09/15/2015  . CAP (community acquired pneumonia) 08/30/2015  . Sepsis ([place]) 08/18/2015  . Restless leg syndrome 08/07/2015  . History of colonic polyps 07/10/2015  . Health care maintenance 01/11/2015  . Weight loss 08/04/2013  . Obstructive sleep apnea 12/23/2012  . Dilated bile duct 12/23/2012  . Hypercholesterolemia 08/12/2012  . Rheumatoid arthritis ([place]) 08/12/2012  . GERD  (gastroesophageal reflux disease) 08/12/2012  . Hypertension 08/12/2012  . Anemia 08/12/2012    Past Surgical History:  Procedure Laterality Date  . ABDOMINAL HYSTERECTOMY    . CERVICAL CONE BIOPSY    . CHOLECYSTECTOMY  06/22/14  . COLONOSCOPY WITH PROPOFOL N/A 07/09/2015   Procedure: COLONOSCOPY WITH PROPOFOL;  Surgeon: Manya Silvas, MD;  Location[facility] ENDOSCOPY;  Service: Endoscopy;  Laterality: N/A;  . ESOPHAGOGASTRODUODENOSCOPY (EGD) WITH PROPOFOL N/A 07/09/2015   Procedure: ESOPHAGOGASTRODUODENOSCOPY (EGD) WITH PROPOFOL;  Surgeon: Manya Silvas, MD;  Location: [facility] ENDOSCOPY;  Service: Endoscopy;  Laterality: N/A;  . OVARY SURGERY    . TRACHEOSTOMY  1959  . TUBAL LIGATION      Prior to Admission medications   Medication Sig Start Date End Date Taking? Authorizing Provider  acidophilus (RISAQUAD) CAPS capsule Take 1 capsule by mouth daily.   Yes Historical Provider, MD  ADVAIR DISKUS 250-50 MCG/DOSE AEPB USE 1 INHALATION EVERY 12 HOURS 07/07/16  Yes [name], MD  albuterol (PROAIR HFA) 108 (90 Base) MCG/ACT inhaler USE 2 INHALATIONS EVERY 6 HOURS AS NEEDED FOR WHEEZING OR SHORTNESS OF BREATH 10/19/16  Yes [name], MD  albuterol (PROVENTIL) (2.5 MG/3ML) 0.083% nebulizer solution Take 2.5 mg by nebulization every 6 (six) hours as needed for wheezing or shortness of breath.   Yes Historical Provider, MD  alendronate (FOSAMAX) 70 MG tablet Take 70 mg by mouth once a week.  09/04/15  Yes Historical Provider, MD  ALPRAZolam [name]) 0.25 MG tablet Take 1 tablet (0.25 mg total) by  mouth daily as needed for anxiety. 10/19/16  Yes [name], MD  aspirin EC 81 MG tablet Take 81 mg by mouth daily.   Yes Historical Provider, MD  Calcium Carbonate-Vitamin D (CALCIUM 600+D) 600-400 MG-UNIT tablet Take 1 tablet by mouth daily.    Yes Historical Provider, MD  cetirizine (ZYRTEC) 10 MG tablet Take 1 tablet (10 mg total) by mouth daily. 07/01/16  Yes [name], MD    cholecalciferol (VITAMIN D) 1000 units tablet Take 1,000 Units by mouth daily.   Yes Historical Provider, MD  colestipol (COLESTID) 1 g tablet Take 2 g by mouth daily.    Yes Historical Provider, MD  Diphenhyd-Hydrocort-Nystatin (FIRST-DUKES MOUTHWASH) SUSP Pt is to swish and spit 5 cc's tid 06/06/16  Yes Burnard Hawthorne, FNP  esomeprazole (NEXIUM) 20 MG capsule Take 20 mg by mouth daily at 12 noon.   Yes Historical Provider, MD  FeFum-FePo-FA-B Cmp-C-Zn-Mn-Cu (TANDEM PLUS) 162-115.2-1 MG CAPS Take 1 capsule by mouth daily. 05/31/16  Yes [name], MD  fluticasone (FLONASE) 50 MCG/ACT nasal spray Place 2 sprays into both nostrils daily. Patient taking differently: Place 2 sprays into both nostrils 2 (two) times daily.  01/27/16  Yes Leone Haven, MD  furosemide (LASIX) 20 MG tablet Take 1 tablet (20 mg total) by mouth daily. 05/31/16  Yes [name], MD  gabapentin (NEURONTIN) 300 MG capsule Take 600 mg by mouth 2 (two) times daily.    Yes Historical Provider, MD  hydroxychloroquine (PLAQUENIL) 200 MG tablet Take 1 tablet (200 mg total) by mouth daily. 06/21/16  Yes Richard Leslye Peer, MD  KLOR-CON SPRINKLE 10 MEQ CR capsule TAKE 2 CAPSULES TWICE A DAY Patient taking differently: TAKE 1 CAPSULE TWICE A DAY 06/09/16  Yes [name], MD  lip balm (BLISTEX) OINT Apply 1 application topically as needed for lip care. 06/21/16  Yes Loletha Grayer, MD  meclizine (ANTIVERT) 25 MG tablet Take 1 tablet (25 mg total) by mouth 3 (three) times daily as needed for dizziness. 08/09/16  Yes Nance Pear, MD  meloxicam (MOBIC) 15 MG tablet Take 15 mg by mouth daily.    Yes Historical Provider, MD  metoprolol (LOPRESSOR) 50 MG tablet Take 1 tablet (50 mg total) by mouth 2 (two) times daily. Patient taking differently: Take 50 mg by mouth daily.  07/19/16  Yes [name], MD  Multiple Vitamin (MULTIVITAMIN WITH MINERALS) TABS tablet Take 1 tablet by mouth daily.   Yes Historical Provider, MD   nystatin cream (MYCOSTATIN) APPLY 1 APPLICATION TOPICALLY TO THE AFFECTED AREA (OUTER VAGINA) TWICE A DAY 05/30/16  Yes Historical Provider, MD  ondansetron (ZOFRAN ODT) 4 MG disintegrating tablet Take 1 tablet (4 mg total) by mouth every 8 (eight) hours as needed for nausea or vomiting. 06/21/16  Yes Loletha Grayer, MD  pramipexole (MIRAPEX) 0.25 MG tablet Take 2 tablets (0.5 mg total) by mouth 2 (two) times daily. 10/19/16  Yes [name], MD  predniSONE (DELTASONE) 5 MG tablet Take 5 tabs po day 1; take 4 tabs po day2; 3 tabs po day3; take 2 tabs po day2; take one tab daily afterwards Patient taking differently: Take 5 mg by mouth daily with breakfast.  06/21/16  Yes Loletha Grayer, MD  simvastatin (ZOCOR) 20 MG tablet TAKE 1 TABLET DAILY 08/18/16  Yes [name], MD  sodium chloride (OCEAN) 0.65 % nasal spray Place 1 spray into the nose as needed for congestion.   Yes Historical Provider, MD  SPIRIVA HANDIHALER 18 MCG inhalation capsule PLACE  1 CAPSULE INTO INHALER AND INHALE THE CONTENTS OF 1 CAPSULE DAILY 08/12/16  Yes [name], MD  traMADol (ULTRAM) 50 MG tablet Take 50 mg by mouth 2 (two) times daily as needed for moderate pain.   Yes Historical Provider, MD  guaiFENesin-dextromethorphan (ROBITUSSIN DM) 100-10 MG/5ML syrup Take 5 mLs by mouth every 4 (four) hours as needed for cough. Patient not taking: Reported on 10/31/2016 08/07/15   Theodoro Grist, MD  nystatin (MYCOSTATIN) 100000 UNIT/ML suspension Take 5 mLs (500,000 Units total) by mouth 4 (four) times daily. Patient not taking: Reported on 10/31/2016 06/21/16   Loletha Grayer, MD  RABEprazole (ACIPHEX) 20 MG tablet Take 1 tablet (20 mg total) by mouth 2 (two) times daily before a meal. Patient not taking: Reported on 10/31/2016 04/08/16   [name], MD     Allergies Astelin [azelastine hcl]; Ciprofloxacin; Codeine; Dilaudid [hydromorphone]; Flexeril [cyclobenzaprine]; Imuran [azathioprine]; Keflex [cephalexin];  Lisinopril; Lyrica [pregabalin]; Methotrexate derivatives; Amoxicillin; Arava [leflunomide]; Clindamycin/lincomycin; Doxycycline; Lodine [etodolac]; Percocet [oxycodone-acetaminophen]; and Sulfa antibiotics  Family History  Problem Relation Age of Onset  . Heart disease Father     MI  . Heart disease Mother   . Valvular heart disease Mother   . Breast cancer Sister 46  . Cancer Sister     Lung cancer  . Colon cancer Neg Hx     Social History Social History  Substance Use Topics  . Smoking status: Never Smoker  . Smokeless tobacco: Never Used  . Alcohol use No    Review of Systems  Constitutional: No fever/chills Eyes: No visual changes.  ENT: No sore throat. Cardiovascular: Chest tightness as above Respiratory: Shortness of breath as above Gastrointestinal: No abdominal pain.  No nausea, no vomiting.   Genitourinary: Negative for dysuria. Musculoskeletal: Negative for back pain. Skin: Negative for rash. Neurological: Negative for headaches    ____________________________________________   PHYSICAL EXAM:  VITAL SIGNS: ED Triage Vitals  Enc Vitals Group     BP 10/31/16 1204 (!) 117/54     Pulse Rate 10/31/16 1204 80     Resp 10/31/16 1204 (!) 24     Temp 10/31/16 1204 (!) 103.3 F (39.6 C)     Temp src --      SpO2 10/31/16 1204 95 %     Weight 10/31/16 1205 156 lb (70.8 kg)     Height 10/31/16 1205 5\' 4"  (1.626 m)     Head Circumference --      Peak Flow --      Pain Score 10/31/16 1202 5     Pain Loc --      Pain Edu? --      Excl. in [place]? --     Constitutional: Alert and oriented. No acute distress. Pleasant and interactive Eyes: Conjunctivae are normal.  Head: Atraumatic. Nose: No congestion/rhinnorhea. Mouth/Throat: Mucous membranes are moist.   Neck:  Painless ROM Cardiovascular: Normal rate, regular rhythm. Grossly normal heart sounds.  Good peripheral circulation. Respiratory: Mildly increased respiratory effort with tachypnea..  No  retractions. Lungs CTAB. Gastrointestinal: Soft and nontender. No distention.  No CVA tenderness. Genitourinary: deferred Musculoskeletal: No lower extremity tenderness nor edema.  Warm and well perfused Neurologic:  Normal speech and language. No gross focal neurologic deficits are appreciated.  Skin:  Skin is warm, dry and intact. No rash noted. Psychiatric: Mood and affect are normal. Speech and behavior are normal.  ____________________________________________   LABS (all labs ordered are listed, but only abnormal results are displayed)  Labs  Reviewed  COMPREHENSIVE METABOLIC PANEL - Abnormal; Notable for the following:       Result Value   Sodium 134 (*)    Potassium 3.4 (*)    Glucose, Bld 116 (*)    Calcium 8.4 (*)    AST 42 (*)    Alkaline Phosphatase 36 (*)    All other components within normal limits  LACTIC ACID, PLASMA - Abnormal; Notable for the following:    Lactic Acid, Venous 2.7 (*)    All other components within normal limits  CBC WITH DIFFERENTIAL/PLATELET - Abnormal; Notable for the following:    WBC 11.4 (*)    RBC 3.79 (*)    Hemoglobin 11.7 (*)    HCT 34.2 (*)    Platelets 147 (*)    Neutro Abs 9.6 (*)    All other components within normal limits  BLOOD GAS, VENOUS - Abnormal; Notable for the following:    pH, Ven 7.44 (*)    pCO2, Ven 39 (*)    pO2, Ven 58.0 (*)    Acid-Base Excess 2.2 (*)    All other components within normal limits  CULTURE, BLOOD (ROUTINE X 2)  CULTURE, BLOOD (ROUTINE X 2)  URINE CULTURE  PROTIME-INR  LACTIC ACID, PLASMA  URINALYSIS, ROUTINE W REFLEX MICROSCOPIC   ____________________________________________  EKG  ED ECG REPORT I, Lavonia Drafts, the attending physician, personally viewed and interpreted this ECG.  Date: 10/31/2016  Rate: 77 Rhythm: normal sinus rhythm QRS Axis: normal Intervals: normal ST/T Wave abnormalities: normal Conduction Disturbances: none Narrative Interpretation:  unremarkable  ____________________________________________  RADIOLOGY  Chest x-ray concerning for pneumonia ____________________________________________   PROCEDURES  Procedure(s) performed: No    Critical Care performed: No ____________________________________________   INITIAL IMPRESSION / ASSESSMENT AND PLAN / ED COURSE  Pertinent labs & imaging results that were available during my care of the patient were reviewed by me and considered in my medical decision making (see chart for details).  Patient presents with fever and cough and increased shortness of breath. Chest x-ray consistent with pneumonia. Lactate is 2.7. We will call a code sepsis and treat broadly with Levaquin and vancomycin given her cephalosporin allergy .    ____________________________________________   FINAL CLINICAL IMPRESSION(S) / ED DIAGNOSES  Final diagnoses:  Community acquired pneumonia of right lower lobe of lung ([place])  Sepsis, due to unspecified organism [facility])      NEW MEDICATIONS STARTED DURING THIS VISIT:  New Prescriptions   No medications on file     Note:  This document was prepared using Dragon voice recognition software and may include unintentional dictation errors.    Lavonia Drafts, MD 10/31/16 1420

## 2016-10-31 NOTE — ED Notes (Signed)
Dr. Bridgett Larsson notified of patient's BP. Request 500 ml bolus and re-check of pressure before admitting patient to floor.

## 2016-10-31 NOTE — ED Triage Notes (Signed)
Patient from home via [facility]. Reports fever and generalized body aches starting today. Patient also reports productive cough beginning this morning. Upon EMS arrival, patient was 89% on 1 L home O2. Patient placed on 2L [place] and is 97% upon arrival to ED. Patient given 650 mg of Tylenol by EMS. Patient alert and oriented x4.

## 2016-10-31 NOTE — ED Notes (Signed)
Dr. Bridgett Larsson notified of updated vitals. States patient is now able to be moved to the floor.

## 2016-10-31 NOTE — ED Notes (Signed)
Dr. Bridgett Larsson notified of vitals after completion of bolus. Order for additional bolus placed. Will continue to monitor.

## 2016-10-31 NOTE — ED Notes (Signed)
Patient ambulated to restroom with one assist. Patient with steady gait. NAD noted.

## 2016-10-31 NOTE — Progress Notes (Signed)
Pharmacy Antibiotic Note  Kaitlyn Huff is a 76 y.o. female admitted on 10/31/2016 with pneumonia.  Pharmacy has been consulted for levofloxacin dosing.  Spoke with Dr. Bridgett Larsson about antibiotic plan. Patient has prolonged QTC of 589. Asked if we can recheck EKG, but MD would like to hold off at this time. Would recommend to monitor QTc with levofloxacin.   Plan: Levofloxacin 500mg  IV Q24hr.   Height: 5\' 4"  (162.6 cm) Weight: 168 lb 9.6 oz (76.5 kg) IBW/kg (Calculated) : 54.7  Temp (24hrs), Avg:100.1 F (37.8 C), Min:98 F (36.7 C), Max:103.3 F (39.6 C)   Recent Labs Lab 10/31/16 1208 10/31/16 1209 10/31/16 1514  WBC 11.4*  --   --   CREATININE 0.74  --   --   LATICACIDVEN  --  2.7* 2.0*    Estimated Creatinine Clearance: 62.7 mL/min (by C-G formula based on SCr of 0.74 mg/dL).    Allergies  Allergen Reactions  . Astelin [Azelastine Hcl] Other (See Comments)    Reaction:  Unknown   . Ciprofloxacin Other (See Comments)    Reaction:  GI upset   . Codeine Other (See Comments)    Reaction:  Altered mental status  . Dilaudid [Hydromorphone] Other (See Comments)    Reaction:  Unknown   . Flexeril [Cyclobenzaprine] Other (See Comments)    Reaction:  Unknown   . Imuran [Azathioprine] Other (See Comments)    Reaction:  Abnormal liver function  . Keflex [Cephalexin] Other (See Comments)    Reaction:  GI upset   . Lisinopril Itching  . Lyrica [Pregabalin] Other (See Comments)    Reaction:  Sore gums   . Methotrexate Derivatives Other (See Comments)    Reaction:  Abnormal liver function  . Amoxicillin Rash and Other (See Comments)    Unable to obtain enough information to answer additional questions about this medication.    Jolee Ewing [Leflunomide] Rash  . Clindamycin/Lincomycin Rash  . Doxycycline Rash  . Lodine [Etodolac] Rash  . Percocet [Oxycodone-Acetaminophen] Rash  . Sulfa Antibiotics Rash    Antimicrobials this admission: Levofloxacin 4/23 >> Vancomycin 4/23  >>   Microbiology results: 4/23 BCx: in process 4/23 UCx: in process   Thank you for allowing pharmacy to be a part of this patient's care.  Loree Fee, PharmD 10/31/2016 7:54 PM

## 2016-10-31 NOTE — H&P (Addendum)
Kaitlyn Huff at [place] NAME: Sonia Bromell    MR#:  [id number]  DATE OF BIRTH:  Mar 01, 1941  DATE OF ADMISSION:  10/31/2016  PRIMARY CARE PHYSICIAN: [name], MD   REQUESTING/REFERRING PHYSICIAN: Lavonia Drafts, MD  CHIEF COMPLAINT:   Chief Complaint  Patient presents with  . Fever  . Cough   Fever, chills, cough and shortness breath today. HISTORY OF PRESENT ILLNESS:  Kaitlyn Huff  is a 76 y.o. female with a known history of Hypertension, hyperlipidemia, anemia and cancer. The patient presented in the ED with the above chief complaint. She has had cough with shortness breath for the past few days. She said she started to have fever and chills today. She denies any chest pain or palpitation or leg edema. She reports she has a history of rheumatoid arthritis and received a Remicade infusion 2 weeks ago. She also reports she has had pneumonia several times in the past. She is hypotensive, tachypnic and has elevated lactic acid. Chest x-ray show pneumonia.  PAST MEDICAL HISTORY:   Past Medical History:  Diagnosis Date  . Anemia   . Cancer ([place])   . Diverticulitis   . GERD (gastroesophageal reflux disease)   . Hyperlipidemia   . Hypertension   . Osteoarthritis   . Osteoporosis    actonel  . Positive PPD    s/p INH (2006)  . Rheumatoid arthritis(714.0)    MTX transaminitis, Leflunomide (rash), enbrel, plaquinil, prednisone, remicade, Imuran (transaminitis)  . Valvular heart disease    moderate MR and TR    PAST SURGICAL HISTORY:   Past Surgical History:  Procedure Laterality Date  . ABDOMINAL HYSTERECTOMY    . CERVICAL CONE BIOPSY    . CHOLECYSTECTOMY  06/22/14  . COLONOSCOPY WITH PROPOFOL N/A 07/09/2015   Procedure: COLONOSCOPY WITH PROPOFOL;  Surgeon: Manya Silvas, MD;  Location: [facility] ENDOSCOPY;  Service: Endoscopy;  Laterality: N/A;  . ESOPHAGOGASTRODUODENOSCOPY (EGD) WITH PROPOFOL N/A 07/09/2015   Procedure:  ESOPHAGOGASTRODUODENOSCOPY (EGD) WITH PROPOFOL;  Surgeon: Manya Silvas, MD;  Location[facility] ENDOSCOPY;  Service: Endoscopy;  Laterality: N/A;  . OVARY SURGERY    . TRACHEOSTOMY  1959  . TUBAL LIGATION      SOCIAL HISTORY:   Social History  Substance Use Topics  . Smoking status: Never Smoker  . Smokeless tobacco: Never Used  . Alcohol use No    FAMILY HISTORY:   Family History  Problem Relation Age of Onset  . Heart disease Father     MI  . Heart disease Mother   . Valvular heart disease Mother   . Breast cancer Sister 24  . Cancer Sister     Lung cancer  . Colon cancer Neg Hx     DRUG ALLERGIES:   Allergies  Allergen Reactions  . Astelin [Azelastine Hcl] Other (See Comments)    Reaction:  Unknown   . Ciprofloxacin Other (See Comments)    Reaction:  GI upset   . Codeine Other (See Comments)    Reaction:  Altered mental status  . Dilaudid [Hydromorphone] Other (See Comments)    Reaction:  Unknown   . Flexeril [Cyclobenzaprine] Other (See Comments)    Reaction:  Unknown   . Imuran [Azathioprine] Other (See Comments)    Reaction:  Abnormal liver function  . Keflex [Cephalexin] Other (See Comments)    Reaction:  GI upset   . Lisinopril Itching  . Lyrica [Pregabalin] Other (See Comments)    Reaction:  Sore gums   . Methotrexate Derivatives Other (See Comments)    Reaction:  Abnormal liver function  . Amoxicillin Rash and Other (See Comments)    Unable to obtain enough information to answer additional questions about this medication.    Jolee Ewing [Leflunomide] Rash  . Clindamycin/Lincomycin Rash  . Doxycycline Rash  . Lodine [Etodolac] Rash  . Percocet [Oxycodone-Acetaminophen] Rash  . Sulfa Antibiotics Rash    REVIEW OF SYSTEMS:   Review of Systems  Constitutional: Positive for chills, fever and malaise/fatigue.  HENT: Positive for sore throat. Negative for congestion.   Eyes: Negative for blurred vision and double vision.  Respiratory: Positive for  cough, sputum production and shortness of breath. Negative for hemoptysis, wheezing and stridor.   Cardiovascular: Negative for chest pain, palpitations and leg swelling.  Gastrointestinal: Positive for nausea. Negative for abdominal pain, blood in stool, diarrhea, melena and vomiting.  Genitourinary: Negative for dysuria, hematuria and urgency.  Musculoskeletal: Negative for back pain.  Skin: Negative for itching and rash.  Neurological: Positive for dizziness and weakness. Negative for focal weakness, seizures, loss of consciousness and headaches.  Psychiatric/Behavioral: Negative for depression. The patient is not nervous/anxious.     MEDICATIONS AT HOME:   Prior to Admission medications   Medication Sig Start Date End Date Taking? Authorizing Provider  acidophilus (RISAQUAD) CAPS capsule Take 1 capsule by mouth daily.   Yes Historical Provider, MD  ADVAIR DISKUS 250-50 MCG/DOSE AEPB USE 1 INHALATION EVERY 12 HOURS 07/07/16  Yes [name], MD  albuterol (PROAIR HFA) 108 (90 Base) MCG/ACT inhaler USE 2 INHALATIONS EVERY 6 HOURS AS NEEDED FOR WHEEZING OR SHORTNESS OF BREATH 10/19/16  Yes [name], MD  albuterol (PROVENTIL) (2.5 MG/3ML) 0.083% nebulizer solution Take 2.5 mg by nebulization every 6 (six) hours as needed for wheezing or shortness of breath.   Yes Historical Provider, MD  alendronate (FOSAMAX) 70 MG tablet Take 70 mg by mouth once a week.  09/04/15  Yes Historical Provider, MD  ALPRAZolam [name]) 0.25 MG tablet Take 1 tablet (0.25 mg total) by mouth daily as needed for anxiety. 10/19/16  Yes [name], MD  aspirin EC 81 MG tablet Take 81 mg by mouth daily.   Yes Historical Provider, MD  Calcium Carbonate-Vitamin D (CALCIUM 600+D) 600-400 MG-UNIT tablet Take 1 tablet by mouth daily.    Yes Historical Provider, MD  cetirizine (ZYRTEC) 10 MG tablet Take 1 tablet (10 mg total) by mouth daily. 07/01/16  Yes [name], MD  cholecalciferol (VITAMIN D) 1000 units tablet  Take 1,000 Units by mouth daily.   Yes Historical Provider, MD  colestipol (COLESTID) 1 g tablet Take 2 g by mouth daily.    Yes Historical Provider, MD  Diphenhyd-Hydrocort-Nystatin (FIRST-DUKES MOUTHWASH) SUSP Pt is to swish and spit 5 cc's tid 06/06/16  Yes Burnard Hawthorne, FNP  esomeprazole (NEXIUM) 20 MG capsule Take 20 mg by mouth daily at 12 noon.   Yes Historical Provider, MD  FeFum-FePo-FA-B Cmp-C-Zn-Mn-Cu (TANDEM PLUS) 162-115.2-1 MG CAPS Take 1 capsule by mouth daily. 05/31/16  Yes [name], MD  fluticasone (FLONASE) 50 MCG/ACT nasal spray Place 2 sprays into both nostrils daily. Patient taking differently: Place 2 sprays into both nostrils 2 (two) times daily.  01/27/16  Yes Leone Haven, MD  furosemide (LASIX) 20 MG tablet Take 1 tablet (20 mg total) by mouth daily. 05/31/16  Yes [name], MD  gabapentin (NEURONTIN) 300 MG capsule Take 600 mg by mouth 2 (two) times daily.  Yes Historical Provider, MD  hydroxychloroquine (PLAQUENIL) 200 MG tablet Take 1 tablet (200 mg total) by mouth daily. 06/21/16  Yes Richard Leslye Peer, MD  KLOR-CON SPRINKLE 10 MEQ CR capsule TAKE 2 CAPSULES TWICE A DAY Patient taking differently: TAKE 1 CAPSULE TWICE A DAY 06/09/16  Yes [name], MD  lip balm (BLISTEX) OINT Apply 1 application topically as needed for lip care. 06/21/16  Yes Loletha Grayer, MD  meclizine (ANTIVERT) 25 MG tablet Take 1 tablet (25 mg total) by mouth 3 (three) times daily as needed for dizziness. 08/09/16  Yes Nance Pear, MD  meloxicam (MOBIC) 15 MG tablet Take 15 mg by mouth daily.    Yes Historical Provider, MD  metoprolol (LOPRESSOR) 50 MG tablet Take 1 tablet (50 mg total) by mouth 2 (two) times daily. Patient taking differently: Take 50 mg by mouth daily.  07/19/16  Yes [name], MD  Multiple Vitamin (MULTIVITAMIN WITH MINERALS) TABS tablet Take 1 tablet by mouth daily.   Yes Historical Provider, MD  nystatin cream (MYCOSTATIN) APPLY 1 APPLICATION  TOPICALLY TO THE AFFECTED AREA (OUTER VAGINA) TWICE A DAY 05/30/16  Yes Historical Provider, MD  ondansetron (ZOFRAN ODT) 4 MG disintegrating tablet Take 1 tablet (4 mg total) by mouth every 8 (eight) hours as needed for nausea or vomiting. 06/21/16  Yes Loletha Grayer, MD  pramipexole (MIRAPEX) 0.25 MG tablet Take 2 tablets (0.5 mg total) by mouth 2 (two) times daily. 10/19/16  Yes [name], MD  predniSONE (DELTASONE) 5 MG tablet Take 5 tabs po day 1; take 4 tabs po day2; 3 tabs po day3; take 2 tabs po day2; take one tab daily afterwards Patient taking differently: Take 5 mg by mouth daily with breakfast.  06/21/16  Yes Loletha Grayer, MD  simvastatin (ZOCOR) 20 MG tablet TAKE 1 TABLET DAILY 08/18/16  Yes [name], MD  sodium chloride (OCEAN) 0.65 % nasal spray Place 1 spray into the nose as needed for congestion.   Yes Historical Provider, MD  SPIRIVA HANDIHALER 18 MCG inhalation capsule PLACE 1 CAPSULE INTO INHALER AND INHALE THE CONTENTS OF 1 CAPSULE DAILY 08/12/16  Yes [name], MD  traMADol (ULTRAM) 50 MG tablet Take 50 mg by mouth 2 (two) times daily as needed for moderate pain.   Yes Historical Provider, MD  guaiFENesin-dextromethorphan (ROBITUSSIN DM) 100-10 MG/5ML syrup Take 5 mLs by mouth every 4 (four) hours as needed for cough. Patient not taking: Reported on 10/31/2016 08/07/15   Theodoro Grist, MD  nystatin (MYCOSTATIN) 100000 UNIT/ML suspension Take 5 mLs (500,000 Units total) by mouth 4 (four) times daily. Patient not taking: Reported on 10/31/2016 06/21/16   Loletha Grayer, MD  RABEprazole (ACIPHEX) 20 MG tablet Take 1 tablet (20 mg total) by mouth 2 (two) times daily before a meal. Patient not taking: Reported on 10/31/2016 04/08/16   [name], MD      VITAL SIGNS:  Blood pressure (!) 84/47, pulse 73, temperature 100 F (37.8 C), temperature source Oral, resp. rate (!) 25, height 5\' 4"  (1.626 m), weight 156 lb (70.8 kg), SpO2 100 %.  PHYSICAL EXAMINATION:    Physical Exam  GENERAL:  76 y.o.-year-old patient lying in the bed with no acute distress.  EYES: Pupils equal, round, reactive to light and accommodation. No scleral icterus. Extraocular muscles intact.  HEENT: Head atraumatic, normocephalic. Oropharynx and nasopharynx Is red.  NECK:  Supple, no jugular venous distention. No thyroid enlargement, no tenderness.  LUNGS: Normal breath sounds bilaterally, no wheezing, Right-sided crackles.  No use of accessory muscles of respiration.  CARDIOVASCULAR: S1, S2 normal. No murmurs, rubs, or gallops.  ABDOMEN: Soft, nontender, nondistended. Bowel sounds present. No organomegaly or mass.  EXTREMITIES: No pedal edema, cyanosis, or clubbing.  NEUROLOGIC: Cranial nerves II through XII are intact. Muscle strength 4/5 in all extremities. Sensation intact. Gait not checked.  PSYCHIATRIC: The patient is alert and oriented x 3.  SKIN: No obvious rash, lesion, or ulcer.   LABORATORY PANEL:   CBC  Recent Labs Lab 10/31/16 1208  WBC 11.4*  HGB 11.7*  HCT 34.2*  PLT 147*   ------------------------------------------------------------------------------------------------------------------  Chemistries   Recent Labs Lab 10/31/16 1208  NA 134*  K 3.4*  CL 101  CO2 26  GLUCOSE 116*  BUN 11  CREATININE 0.74  CALCIUM 8.4*  AST 42*  ALT 24  ALKPHOS 36*  BILITOT 0.9   ------------------------------------------------------------------------------------------------------------------  Cardiac Enzymes No results for input(s): TROPONINI in the last 168 hours. ------------------------------------------------------------------------------------------------------------------  RADIOLOGY:  Dg Chest 2 View  Result Date: 10/31/2016 CLINICAL DATA:  Patient from home via [facility]. Reports fever and generalized body aches starting today. Patient also reports productive cough beginning this morning. EXAM: CHEST  2 VIEW COMPARISON:  07/29/2016 FINDINGS: Patchy  right lower lobe airspace disease. No pleural effusion or pneumothorax. Stable cardiomegaly. No acute osseous abnormality. IMPRESSION: Right lower lobe airspace disease concerning for pneumonia. Followup PA and lateral chest X-ray is recommended in 3-4 weeks following trial of antibiotic therapy to ensure resolution and exclude underlying malignancy. Electronically Signed   By: Kathreen Devoid   On: 10/31/2016 13:11      IMPRESSION AND PLAN:   Septic shock due to pneumonia, CPAP. Patient will be admitted to medical floor or stepdown unit if still hypotension with treatment. We will give normal saline bolus, Levophed when necessary. Start sepsis protocol, IV antibiotics with Zithromax and Rocephin, follow-up CBC and cultures. Lactic acidosis. Continue treatment as above, follow-up lactic acid level. Hyponatremia. Continue normal saline IV and follow-up BMP. Hypokalemia. Give potassium supplement, follow-up magnesium and potassium level.  Hypertension. Hold Lopressor due to hypotension.  All the records are reviewed and case discussed with ED provider. Management plans discussed with the patient, her daughter and they are in agreement. The patient was in DO NOT RESUSCITATE status, but she wants to change full code this time.  CODE STATUS: Full code.  TOTALCRITICAL  TIME TAKING CARE OF THIS PATIENT: 60 minutes.    Demetrios Loll M.D on 10/31/2016 at 3:46 PM  Between 7am to 6pm - Pager - [phone]  After 6pm go to [email or web address] - Proofreader  [facility]  Office  [phone]  CC: Primary care physician; [name], MD   Note: This dictation was prepared with Dragon dictation along with smaller phrase technology. Any transcriptional errors that result from this process are unintentional.

## 2016-10-31 NOTE — ED Notes (Signed)
CODE  SEPSIS  CALLED  TO  THOMAS  AT  [facility] 

## 2016-10-31 NOTE — Progress Notes (Signed)
BP is still at 89/40, give another NS 500 ml bolus. Discussed with RN.

## 2016-11-01 LAB — BASIC METABOLIC PANEL
ANION GAP: 7 (ref 5–15)
BUN: 11 mg/dL (ref 6–20)
CHLORIDE: 111 mmol/L (ref 101–111)
CO2: 23 mmol/L (ref 22–32)
Calcium: 7.5 mg/dL — ABNORMAL LOW (ref 8.9–10.3)
Creatinine, Ser: 0.81 mg/dL (ref 0.44–1.00)
GFR calc Af Amer: >60 mL/min (ref >60)
Glucose, Bld: 80 mg/dL (ref 65–99)
POTASSIUM: 4.4 mmol/L (ref 3.5–5.1)
Sodium: 141 mmol/L (ref 135–145)

## 2016-11-01 LAB — CBC
HEMATOCRIT: 29.2 % — AB (ref 35.0–47.0)
HEMOGLOBIN: 9.9 g/dL — AB (ref 12.0–16.0)
MCH: 31.5 pg (ref 26.0–34.0)
MCHC: 34 g/dL (ref 32.0–36.0)
MCV: 92.7 fL (ref 80.0–100.0)
Platelets: 115 10*3/uL — ABNORMAL LOW (ref 150–440)
RBC: 3.15 MIL/uL — ABNORMAL LOW (ref 3.80–5.20)
RDW: 13.3 % (ref 11.5–14.5)
WBC: 13.7 10*3/uL — ABNORMAL HIGH (ref 3.6–11.0)

## 2016-11-01 MED ORDER — DOXYCYCLINE HYCLATE 100 MG IV SOLR
100.0000 mg | Freq: Two times a day (BID) | INTRAVENOUS | Status: DC
Start: 2016-11-01 — End: 2016-11-01

## 2016-11-01 MED ORDER — ACETAMINOPHEN 325 MG PO TABS: 650.0000 mg | ORAL_TABLET | Freq: Four times a day (QID) | ORAL | Status: DC | PRN

## 2016-11-01 MED ORDER — PREDNISONE 10 MG PO TABS
50.0000 mg | ORAL_TABLET | Freq: Once | ORAL | Status: AC
  Administered 2016-11-01: 15:00:00 50 mg via ORAL
  Filled 2016-11-01: qty 1

## 2016-11-01 MED ORDER — MAGNESIUM OXIDE 400 (241.3 MG) MG PO TABS
400.0000 mg | ORAL_TABLET | ORAL | Status: AC
  Administered 2016-11-01 (×3): 400 mg via ORAL
  Filled 2016-11-01 (×3): qty 1

## 2016-11-01 MED ORDER — TRAMADOL HCL 50 MG PO TABS: 50.0000 mg | ORAL_TABLET | Freq: Two times a day (BID) | ORAL | Status: DC | PRN

## 2016-11-01 MED ORDER — ENSURE ENLIVE PO LIQD
237.0000 mL | Freq: Two times a day (BID) | ORAL | Status: DC
  Administered 2016-11-02: 237 mL via ORAL

## 2016-11-01 MED ORDER — MELOXICAM 7.5 MG PO TABS
15.0000 mg | ORAL_TABLET | Freq: Every day | ORAL | Status: DC
  Administered 2016-11-02: 15 mg via ORAL
  Filled 2016-11-01: qty 2

## 2016-11-01 MED ORDER — DEXTROSE 5 % IV SOLN
1.0000 g | INTRAVENOUS | Status: DC
  Administered 2016-11-02: 1 g via INTRAVENOUS
  Filled 2016-11-01: qty 10

## 2016-11-01 MED ORDER — GUAIFENESIN-DM 100-10 MG/5ML PO SYRP: 5.0000 mL | ORAL_SOLUTION | ORAL | Status: DC | PRN

## 2016-11-01 NOTE — Progress Notes (Signed)
[facility] [place] NAME: Kaitlyn Huff    MR#:  [id number]  DATE OF BIRTH:  11/05/1940  SUBJECTIVE:  CHIEF COMPLAINT:   Chief Complaint  Patient presents with  . Fever  . Cough   Still has shortness of breath and cough. Afebrile today.  REVIEW OF SYSTEMS:    Review of Systems  Constitutional: Positive for malaise/fatigue. Negative for chills and fever.  HENT: Negative for sore throat.   Eyes: Negative for blurred vision, double vision and pain.  Respiratory: Positive for cough and shortness of breath. Negative for hemoptysis and wheezing.   Cardiovascular: Negative for chest pain, palpitations, orthopnea and leg swelling.  Gastrointestinal: Negative for abdominal pain, constipation, diarrhea, heartburn, nausea and vomiting.  Genitourinary: Negative for dysuria and hematuria.  Musculoskeletal: Negative for back pain and joint pain.  Skin: Negative for rash.  Neurological: Positive for weakness. Negative for sensory change, speech change, focal weakness and headaches.  Endo/Heme/Allergies: Does not bruise/bleed easily.  Psychiatric/Behavioral: Negative for depression. The patient is not nervous/anxious.     DRUG ALLERGIES:   Allergies  Allergen Reactions  . Astelin [Azelastine Hcl] Other (See Comments)    Reaction:  Unknown   . Ciprofloxacin Other (See Comments)    Reaction:  GI upset   . Codeine Other (See Comments)    Reaction:  Altered mental status  . Dilaudid [Hydromorphone] Other (See Comments)    Reaction:  Unknown   . Flexeril [Cyclobenzaprine] Other (See Comments)    Reaction:  Unknown   . Imuran [Azathioprine] Other (See Comments)    Reaction:  Abnormal liver function  . Keflex [Cephalexin] Other (See Comments)    Reaction:  GI upset   . Lisinopril Itching  . Lyrica [Pregabalin] Other (See Comments)    Reaction:  Sore gums   . Methotrexate Derivatives Other (See Comments)    Reaction:  Abnormal liver function   . Amoxicillin Rash and Other (See Comments)    Unable to obtain enough information to answer additional questions about this medication.    Jolee Ewing [Leflunomide] Rash  . Clindamycin/Lincomycin Rash  . Doxycycline Rash  . Lodine [Etodolac] Rash  . Percocet [Oxycodone-Acetaminophen] Rash  . Sulfa Antibiotics Rash    VITALS:  Blood pressure (!) 109/50, pulse 70, temperature 98.3 F (36.8 C), temperature source Oral, resp. rate 20, height 5\' 4"  (1.626 m), weight 76.5 kg (168 lb 9.6 oz), SpO2 99 %.  PHYSICAL EXAMINATION:   Physical Exam  GENERAL:  76 y.o.-year-old patient lying in the bed with no acute distress.  EYES: Pupils equal, round, reactive to light and accommodation. No scleral icterus. Extraocular muscles intact.  HEENT: Head atraumatic, normocephalic. Oropharynx and nasopharynx clear.  NECK:  Supple, no jugular venous distention. No thyroid enlargement, no tenderness.  LUNGS: Normal breath sounds bilaterally, no wheezing, rales, rhonchi. No use of accessory muscles of respiration.  CARDIOVASCULAR: S1, S2 normal. No murmurs, rubs, or gallops.  ABDOMEN: Soft, nontender, nondistended. Bowel sounds present. No organomegaly or mass.  EXTREMITIES: No cyanosis, clubbing or edema b/l.    NEUROLOGIC: Cranial nerves II through XII are intact. No focal Motor or sensory deficits b/l.   PSYCHIATRIC: The patient is alert and oriented x 3.  SKIN: No obvious rash, lesion, or ulcer.   LABORATORY PANEL:   CBC  Recent Labs Lab 11/01/16 0522  WBC 13.7*  HGB 9.9*  HCT 29.2*  PLT 115*   ------------------------------------------------------------------------------------------------------------------ Chemistries   Recent Labs Lab 10/31/16 1208  10/31/16 1813 11/01/16 0522  NA 134*  --  141  K 3.4*  --  4.4  CL 101  --  111  CO2 26  --  23  GLUCOSE 116*  --  80  BUN 11  --  11  CREATININE 0.74  --  0.81  CALCIUM 8.4*  --  7.5*  MG  --  1.4*  --   AST 42*  --   --   ALT 24   --   --   ALKPHOS 36*  --   --   BILITOT 0.9  --   --    ------------------------------------------------------------------------------------------------------------------  Cardiac Enzymes No results for input(s): TROPONINI in the last 168 hours. ------------------------------------------------------------------------------------------------------------------  RADIOLOGY:  Dg Chest 2 View  Result Date: 10/31/2016 CLINICAL DATA:  Patient from home via [facility]. Reports fever and generalized body aches starting today. Patient also reports productive cough beginning this morning. EXAM: CHEST  2 VIEW COMPARISON:  07/29/2016 FINDINGS: Patchy right lower lobe airspace disease. No pleural effusion or pneumothorax. Stable cardiomegaly. No acute osseous abnormality. IMPRESSION: Right lower lobe airspace disease concerning for pneumonia. Followup PA and lateral chest X-ray is recommended in 3-4 weeks following trial of antibiotic therapy to ensure resolution and exclude underlying malignancy. Electronically Signed   By: Kathreen Devoid   On: 10/31/2016 13:11   ASSESSMENT AND PLAN:   * Right lower lobe pneumonia with acute on chronic hypoxic respiratory failure. On IV Levaquin. Improving well. Cultures pending. Nebulizers. Wean oxygen as tolerated.  * Rheumatoid arthritis. Continue home medications.  * Anemia of chronic disease is stable.  * Hypomagnesemia. Will replace.  All the records are reviewed and case discussed with Care Management/Social Worker Management plans discussed with the patient, family and they are in agreement.  CODE STATUS: FULL CODE  DVT Prophylaxis: SCDs  TOTAL TIME TAKING CARE OF THIS PATIENT: 35 minutes.   POSSIBLE D/C IN 1-2 DAYS, DEPENDING ON CLINICAL CONDITION.  Hillary Bow R M.D on 11/01/2016 at 1:27 PM  Between 7am to 6pm - Pager - [phone]  After 6pm go to [email or web address] - password [email or web address]  Office   [phone]  CC: Primary care physician; [name], MD  Note: This dictation was prepared with Dragon dictation along with smaller phrase technology. Any transcriptional errors that result from this process are unintentional.

## 2016-11-01 NOTE — Progress Notes (Signed)
Pharmacy Antibiotic Note  Kaitlyn Huff is a 76 y.o. female admitted on 10/31/2016 with pneumonia.  Pharmacy has been consulted for ceftriaxone dosing. Patient is being transitioned from levofloxacin to ceftriaxone.  Plan: Last dose of levofloxacin 500 mg was at 1430. Will begin ceftriaxone 1g IV q 24 hours tomorrow am  Height: 5\' 4"  (162.6 cm) Weight: 168 lb 9.6 oz (76.5 kg) IBW/kg (Calculated) : 54.7  Temp (24hrs), Avg:98.5 F (36.9 C), Min:98 F (36.7 C), Max:98.9 F (37.2 C)   Recent Labs Lab 10/31/16 1208 10/31/16 1209 10/31/16 1514 11/01/16 0522  WBC 11.4*  --   --  13.7*  CREATININE 0.74  --   --  0.81  LATICACIDVEN  --  2.7* 2.0*  --     Estimated Creatinine Clearance: 61.9 mL/min (by C-G formula based on SCr of 0.81 mg/dL).    Allergies  Allergen Reactions  . Astelin [Azelastine Hcl] Other (See Comments)    Reaction:  Unknown   . Ciprofloxacin Other (See Comments)    Reaction:  GI upset   . Codeine Other (See Comments)    Reaction:  Altered mental status  . Dilaudid [Hydromorphone] Other (See Comments)    Reaction:  Unknown   . Flexeril [Cyclobenzaprine] Other (See Comments)    Reaction:  Unknown   . Imuran [Azathioprine] Other (See Comments)    Reaction:  Abnormal liver function  . Keflex [Cephalexin] Other (See Comments)    Reaction:  GI upset   . Lisinopril Itching  . Lyrica [Pregabalin] Other (See Comments)    Reaction:  Sore gums   . Methotrexate Derivatives Other (See Comments)    Reaction:  Abnormal liver function  . Amoxicillin Rash and Other (See Comments)    Unable to obtain enough information to answer additional questions about this medication.    Jolee Ewing [Leflunomide] Rash  . Clindamycin/Lincomycin Rash  . Doxycycline Rash  . Lodine [Etodolac] Rash  . Percocet [Oxycodone-Acetaminophen] Rash  . Sulfa Antibiotics Rash    Antimicrobials this admission: levofloxacin 4/23 >> 4/24 Vancomycin x 1 Ceftriaxone 4/24 >>  Dose adjustments  this admission:  Microbiology results: 4/23 BCx: NG < 24 hours 4/23 UCx: sent  Thank you for allowing pharmacy to be a part of this patient's care.  Darrow Bussing, PharmD Pharmacy Resident 11/01/2016 3:42 PM

## 2016-11-01 NOTE — Progress Notes (Signed)
Initial Nutrition Assessment  DOCUMENTATION CODES:   Not applicable  INTERVENTION:  Recommend liberalizing diet from Heart Healthy to Regular in setting of poor PO intake.  Provide Ensure Enlive po BID, each supplement provides 350 kcal and 20 grams of protein. Patient prefers strawberry.  NUTRITION DIAGNOSIS:   Inadequate oral intake related to poor appetite as evidenced by per patient/family report.  GOAL:   Patient will meet greater than or equal to 90% of their needs  MONITOR:   PO intake, Supplement acceptance, Labs, Weight trends, I & O's  REASON FOR ASSESSMENT:   Malnutrition Screening Tool, Consult Assessment of nutrition requirement/status  ASSESSMENT:   76 year old female with PMHx of rheumatoid arthritis, HLD, GERD, HTN, cancer, valvular heart disease admitted with right lower lobe PNA.   Spoke with patient at bedside. She reports she has had a poor appetite for a while now. She reports eating just 1-2 meals per day now, though she occasionally snacks at night. Her breakfast this morning is representative of her typical intake - she reports having about 50% of her eggs, all of her toast, and a few bites of her grits. Dinner is usually mashed potatoes, macaroni and cheese, green beans. Patient reports she cannot eat much meat anymore due to taste. She enjoys ice cream after dinner. She enjoys strawberry Ensure and is willing to drink these daily between meals.  Patient unsure of exact UBW. She reports she takes Lasix daily and her weight changes with fluid. Per chart patient's weight mainly fluctuates between 162-172 lbs. Weight of 180 lbs on 06/17/2016 likely falsely elevated. Per chart weight has been increasing lately.  Medications reviewed and include: acidophilus 1 capsule daily, magnesium oxide 400 mg Q4hrs, pantoprazole, potassium chloride 10 mEq daily, prednisone 5 mg daily, ceftriaxone.  Labs reviewed: Magnesium 1.4 on 4/23.  Nutrition-Focused physical exam  completed. Findings are mild-moderate fat depletion noted only in upper arm region, mild-moderate muscle depletion, and no edema.   Patient does not meet criteria for malnutrition at this time, but is at risk for malnutrition due to reported poor appetite and intake.  Diet Order:  Diet Heart Room service appropriate? Yes; Fluid consistency: Thin  Skin:  Reviewed, no issues  Last BM:  PTA (10/30/2016 per chart)  Height:   Ht Readings from Last 1 Encounters:  10/31/16 5\' 4"  (1.626 m)    Weight:   Wt Readings from Last 1 Encounters:  10/31/16 168 lb 9.6 oz (76.5 kg)    Ideal Body Weight:  54.5 kg  BMI:  Body mass index is 28.94 kg/m.  Estimated Nutritional Needs:   Kcal:  1512-1765 (MSJ x 1.2-1.4)  Protein:  75-90 grams (1-1.2 grams/kg)  Fluid:  1.9 L/day (25 ml/kg)  EDUCATION NEEDS:   No education needs identified at this time  Kaitlyn Blade, MS, RD, LDN Pager: [phone] After Hours Pager: [phone]

## 2016-11-02 ENCOUNTER — Telehealth: Payer: Self-pay | Admitting: Internal Medicine

## 2016-11-02 LAB — URINE CULTURE: Culture: NO GROWTH

## 2016-11-02 LAB — MAGNESIUM: Magnesium: 1.8 mg/dL (ref 1.7–2.4)

## 2016-11-02 LAB — BASIC METABOLIC PANEL
Anion gap: 4 — ABNORMAL LOW (ref 5–15)
BUN: 12 mg/dL (ref 6–20)
CALCIUM: 8.1 mg/dL — AB (ref 8.9–10.3)
CO2: 25 mmol/L (ref 22–32)
CREATININE: 0.72 mg/dL (ref 0.44–1.00)
Chloride: 107 mmol/L (ref 101–111)
GFR calc non Af Amer: >60 mL/min (ref >60)
GLUCOSE: 161 mg/dL — AB (ref 65–99)
Potassium: 4.1 mmol/L (ref 3.5–5.1)
Sodium: 136 mmol/L (ref 135–145)

## 2016-11-02 MED ORDER — LEVOFLOXACIN 500 MG PO TABS: 500.0000 mg | ORAL_TABLET | Freq: Every day | ORAL | 0 refills | Status: DC

## 2016-11-02 NOTE — Discharge Instructions (Addendum)
Resume diet and activity as before  Community-Acquired Pneumonia, Adult Pneumonia is an infection of the lungs. One type of pneumonia can happen while a person is in a hospital. A different type can happen when a person is not in a hospital (community-acquired pneumonia). It is easy for this kind to spread from person to person. It can spread to you if you breathe near an infected person who coughs or sneezes. Some symptoms include:  A dry cough.  A wet (productive) cough.  Fever.  Sweating.  Chest pain. Follow these instructions at home:  Take over-the-counter and prescription medicines only as told by your doctor.  Only take cough medicine if you are losing sleep.  If you were prescribed an antibiotic medicine, take it as told by your doctor. Do not stop taking the antibiotic even if you start to feel better.  Sleep with your head and neck raised (elevated). You can do this by putting a few pillows under your head, or you can sleep in a recliner.  Do not use tobacco products. These include cigarettes, chewing tobacco, and e-cigarettes. If you need help quitting, ask your doctor.  Drink enough water to keep your pee (urine) clear or pale yellow. A shot (vaccine) can help prevent pneumonia. Shots are often suggested for:  People older than 74 years of age.  People older than 74 years of age:  Who are having cancer treatment.  Who have long-term (chronic) lung disease.  Who have problems with their body's defense system (immune system). You may also prevent pneumonia if you take these actions:  Get the flu (influenza) shot every year.  Go to the dentist as often as told.  Wash your hands often. If soap and water are not available, use hand sanitizer. Contact a doctor if:  You have a fever.  You lose sleep because your cough medicine does not help. Get help right away if:  You are short of breath and it gets worse.  You have more chest pain.  Your sickness gets  worse. This is very serious if:  You are an older adult.  Your body's defense system is weak.  You cough up blood. This information is not intended to replace advice given to you by your health care provider. Make sure you discuss any questions you have with your health care provider. Document Released: 12/14/2007 Document Revised: 12/03/2015 Document Reviewed: 10/22/2014 Elsevier Interactive Patient Education  2017 [employer].

## 2016-11-02 NOTE — Care Management Important Message (Signed)
Important Message  Patient Details  Name: Kaitlyn Huff MRN: [id number] Date of Birth: Jul 20, 1940   Medicare Important Message Given:  N/A - LOS <3 / Initial given by admissions    Beverly Sessions, RN 11/02/2016, 10:21 AM

## 2016-11-02 NOTE — Progress Notes (Signed)
Patient discharge teaching given, including activity, diet, follow-up appoints, and medications. Patient verbalized understanding of all discharge instructions. IV access was d/c'd. Vitals are stable. Skin is intact except as charted in most recent assessments. Pt to be escorted out by volunteer, to be driven home by family.  Nour Scalise  

## 2016-11-02 NOTE — Care Management (Signed)
Admitted with PNA.  PCP [facility].  Pharmacy [facility].  Patient lives at home with daughter.  Patient has continuous home O2 through [facility].  Patient also has home nebulizer.  Patient was ambulated prior to discharge and maintain sats of 96 on RA with exertion.  Did not need any medical equipment for ambulation.  No skilled nursing needs indicated.  RNCM signing off.

## 2016-11-02 NOTE — Progress Notes (Signed)
Pt ambulated around the nursing station one time on room air. O2 prior to ambulation was 98% on room air, pt.'s O2 on room air during ambulation was 96%. Pt did not require any assistance devices. Pt tolerate ambulation very well.   Anne-Marie Genson [employer]

## 2016-11-02 NOTE — Telephone Encounter (Signed)
[place], [place] [phone] called from [facility]. Pt is being discharged today. Dx was [place]. Pt needs a appt in 1 week. No appt avail to sch. Thank you!

## 2016-11-03 ENCOUNTER — Other Ambulatory Visit: Payer: Self-pay | Admitting: Internal Medicine

## 2016-11-03 NOTE — Telephone Encounter (Signed)
Need appointment place and time for hospital follow up, patient stated elevation is helping legs no redness noted and no pain , patient stated she would be evaluated if swelling persist or redness or pain develops.

## 2016-11-03 NOTE — Telephone Encounter (Signed)
Need appointment date and time for HFU for CAP, patient also had concerned that she noticed last night swelling to left ankle,feet, and leg, today both legs are swelling , patient has been sitting with legs down advised patient to elevate and I would forward to PCP.

## 2016-11-03 NOTE — Telephone Encounter (Signed)
Transition Care Management Follow-up Telephone Call  How have you been since you were released from the hospital? Patient stated she is starting to regain a little strength.   Do you understand why you were in the hospital? Yes   Do you understand the discharge instrcutions? Yes  Items Reviewed:  Medications reviewed:Yes , potassium increased by hospitalist due to hypokalemia.  Allergies reviewed: Yes  Dietary changes reviewed: Yes, heart healthy  Referrals reviewed: Yes follow up with PCP with in one week.   Functional Questionnaire:   Activities of Daily Living (ADLs):   She states they are independent in the following: Patient independent in all ADL"s States they require assistance with the following:No, assist needed.   Any transportation issues/concerns?:No   Any patient concerns? Yes both bilateral legs and feet swelling in ankles , feet, and calves of legs. Started last night, with just the left and today both legs. Denies pain.  Confirmed importance and date/time of follow-up visits scheduled: Yes   Confirmed with patient if condition begins to worsen call PCP or go to the ER.  Patient was given the Call-a-Nurse line [phone]: Yes

## 2016-11-03 NOTE — Telephone Encounter (Signed)
Agree with leg elevation, but if persistent swelling, any redness or unilateral swelling - needs to go ahead and be evaluated to confirm no clot, etc.

## 2016-11-04 NOTE — Telephone Encounter (Signed)
Hold for a cancellation next week.  Let me know beginning of next week if no cancellation.  We will have to work her in.  Thanks.

## 2016-11-04 NOTE — Discharge Summary (Signed)
[facility] [place] NAME: Kaitlyn Huff    MR#:  [id number]  DATE OF BIRTH:  19-Feb-1941  DATE OF ADMISSION:  10/31/2016 ADMITTING PHYSICIAN: Demetrios Loll, MD  DATE OF DISCHARGE: 11/02/2016 12:14 PM  PRIMARY CARE PHYSICIAN: [name], MD   ADMISSION DIAGNOSIS:  Sepsis, due to unspecified organism ([place]) [A41.9] Community acquired pneumonia of right lower lobe of lung ([place]) [J18.1]  DISCHARGE DIAGNOSIS:  Active Problems:   Sepsis ([place])   SECONDARY DIAGNOSIS:   Past Medical History:  Diagnosis Date  . Anemia   . Cancer ([place])   . Diverticulitis   . GERD (gastroesophageal reflux disease)   . Hyperlipidemia   . Hypertension   . Osteoarthritis   . Osteoporosis    actonel  . Positive PPD    s/p INH (2006)  . Rheumatoid arthritis(714.0)    MTX transaminitis, Leflunomide (rash), enbrel, plaquinil, prednisone, remicade, Imuran (transaminitis)  . Valvular heart disease    moderate MR and TR     ADMITTING HISTORY  HISTORY OF PRESENT ILLNESS:  Kaitlyn Huff  is a 76 y.o. female with a known history of Hypertension, hyperlipidemia, anemia and cancer. The patient presented in the ED with the above chief complaint. She has had cough with shortness breath for the past few days. She said she started to have fever and chills today. She denies any chest pain or palpitation or leg edema. She reports she has a history of rheumatoid arthritis and received a Remicade infusion 2 weeks ago. She also reports she has had pneumonia several times in the past. She is hypotensive, tachypnic and has elevated lactic acid. Chest x-ray show pneumonia.   HOSPITAL COURSE:   * Right lower lobe pneumonia with acute on chronic hypoxic respiratory failure * Rheumatoid arthritis * Anemia of chronic disease * Hypokalemia and hypomagnesemia  Patient was admitted as inpatient. Treated with IV antibiotics. Initially was on increased oxygen. This slowly trended down  back to baseline. By the day of discharge patient is ambulatory in the hallway with oxygen levels greater than 92%.  CONSULTS OBTAINED:    DRUG ALLERGIES:   Allergies  Allergen Reactions  . Astelin [Azelastine Hcl] Other (See Comments)    Reaction:  Unknown   . Ciprofloxacin Other (See Comments)    Reaction:  GI upset   . Codeine Other (See Comments)    Reaction:  Altered mental status  . Dilaudid [Hydromorphone] Other (See Comments)    Reaction:  Unknown   . Flexeril [Cyclobenzaprine] Other (See Comments)    Reaction:  Unknown   . Imuran [Azathioprine] Other (See Comments)    Reaction:  Abnormal liver function  . Keflex [Cephalexin] Other (See Comments)    Reaction:  GI upset   . Lisinopril Itching  . Lyrica [Pregabalin] Other (See Comments)    Reaction:  Sore gums   . Methotrexate Derivatives Other (See Comments)    Reaction:  Abnormal liver function  . Amoxicillin Rash and Other (See Comments)    Unable to obtain enough information to answer additional questions about this medication.    Jolee Ewing [Leflunomide] Rash  . Clindamycin/Lincomycin Rash  . Doxycycline Rash  . Lodine [Etodolac] Rash  . Percocet [Oxycodone-Acetaminophen] Rash  . Sulfa Antibiotics Rash    DISCHARGE MEDICATIONS:   Discharge Medication List as of 11/02/2016 10:24 AM    START taking these medications   Details  levofloxacin (LEVAQUIN) 500 MG tablet Take 1 tablet (500 mg total) by mouth  daily., Starting Wed 11/02/2016, Normal      CONTINUE these medications which have NOT CHANGED   Details  acidophilus (RISAQUAD) CAPS capsule Take 1 capsule by mouth daily., Historical Med    ADVAIR DISKUS 250-50 MCG/DOSE AEPB USE 1 INHALATION EVERY 12 HOURS, Normal    albuterol (PROAIR HFA) 108 (90 Base) MCG/ACT inhaler USE 2 INHALATIONS EVERY 6 HOURS AS NEEDED FOR WHEEZING OR SHORTNESS OF BREATH, Normal    albuterol (PROVENTIL) (2.5 MG/3ML) 0.083% nebulizer solution Take 2.5 mg by nebulization every 6 (six)  hours as needed for wheezing or shortness of breath., Historical Med    alendronate (FOSAMAX) 70 MG tablet Take 70 mg by mouth once a week. , Starting Fri 09/04/2015, Historical Med    ALPRAZolam (XANAX) 0.25 MG tablet Take 1 tablet (0.25 mg total) by mouth daily as needed for anxiety., Starting Wed 10/19/2016, Print    aspirin EC 81 MG tablet Take 81 mg by mouth daily., Historical Med    Calcium Carbonate-Vitamin D (CALCIUM 600+D) 600-400 MG-UNIT tablet Take 1 tablet by mouth daily. , Historical Med    cetirizine (ZYRTEC) 10 MG tablet Take 1 tablet (10 mg total) by mouth daily., Starting Fri 07/01/2016, Normal    cholecalciferol (VITAMIN D) 1000 units tablet Take 1,000 Units by mouth daily., Historical Med    colestipol (COLESTID) 1 g tablet Take 2 g by mouth daily. , Historical Med    Diphenhyd-Hydrocort-Nystatin (FIRST-DUKES MOUTHWASH) SUSP Pt is to swish and spit 5 cc's tid, Normal    esomeprazole (NEXIUM) 20 MG capsule Take 20 mg by mouth daily at 12 noon., Historical Med    fluticasone (FLONASE) 50 MCG/ACT nasal spray Place 2 sprays into both nostrils daily., Starting Wed 01/27/2016, Normal    furosemide (LASIX) 20 MG tablet Take 1 tablet (20 mg total) by mouth daily., Starting Tue 05/31/2016, Normal    gabapentin (NEURONTIN) 300 MG capsule Take 600 mg by mouth 2 (two) times daily. , Historical Med    hydroxychloroquine (PLAQUENIL) 200 MG tablet Take 1 tablet (200 mg total) by mouth daily., Starting Tue 06/21/2016, No Print    KLOR-CON SPRINKLE 10 MEQ CR capsule TAKE 2 CAPSULES TWICE A DAY, Normal    lip balm (BLISTEX) OINT Apply 1 application topically as needed for lip care., Starting Tue 06/21/2016, Print    meclizine (ANTIVERT) 25 MG tablet Take 1 tablet (25 mg total) by mouth 3 (three) times daily as needed for dizziness., Starting Tue 08/09/2016, Print    meloxicam (MOBIC) 15 MG tablet Take 15 mg by mouth daily. , Historical Med    metoprolol (LOPRESSOR) 50 MG tablet Take  1 tablet (50 mg total) by mouth 2 (two) times daily., Starting Tue 07/19/2016, Normal    Multiple Vitamin (MULTIVITAMIN WITH MINERALS) TABS tablet Take 1 tablet by mouth daily., Historical Med    nystatin cream (MYCOSTATIN) APPLY 1 APPLICATION TOPICALLY TO THE AFFECTED AREA (OUTER VAGINA) TWICE A DAY, Historical Med    ondansetron (ZOFRAN ODT) 4 MG disintegrating tablet Take 1 tablet (4 mg total) by mouth every 8 (eight) hours as needed for nausea or vomiting., Starting Tue 06/21/2016, Print    pramipexole (MIRAPEX) 0.25 MG tablet Take 2 tablets (0.5 mg total) by mouth 2 (two) times daily., Starting Wed 10/19/2016, Normal    predniSONE (DELTASONE) 5 MG tablet Take 5 tabs po day 1; take 4 tabs po day2; 3 tabs po day3; take 2 tabs po day2; take one tab daily afterwards, Print    simvastatin (  ZOCOR) 20 MG tablet TAKE 1 TABLET DAILY, Normal    sodium chloride (OCEAN) 0.65 % nasal spray Place 1 spray into the nose as needed for congestion., Historical Med    SPIRIVA HANDIHALER 18 MCG inhalation capsule PLACE 1 CAPSULE INTO INHALER AND INHALE THE CONTENTS OF 1 CAPSULE DAILY, Fax    traMADol (ULTRAM) 50 MG tablet Take 50 mg by mouth 2 (two) times daily as needed for moderate pain., Historical Med    FeFum-FePo-FA-B Cmp-C-Zn-Mn-Cu (TANDEM PLUS) 162-115.2-1 MG CAPS Take 1 capsule by mouth daily., Starting Tue 05/31/2016, Normal    guaiFENesin-dextromethorphan (ROBITUSSIN DM) 100-10 MG/5ML syrup Take 5 mLs by mouth every 4 (four) hours as needed for cough., Starting Fri 08/07/2015, Normal    nystatin (MYCOSTATIN) 100000 UNIT/ML suspension Take 5 mLs (500,000 Units total) by mouth 4 (four) times daily., Starting Tue 06/21/2016, Print    RABEprazole (ACIPHEX) 20 MG tablet Take 1 tablet (20 mg total) by mouth 2 (two) times daily before a meal., Starting Fri 04/08/2016, Normal        Today   VITAL SIGNS:  Blood pressure (!) 110/52, pulse 72, temperature 98.2 F (36.8 C), temperature source Oral,  resp. rate 17, height 5\' 4"  (1.626 m), weight 76.5 kg (168 lb 9.6 oz), SpO2 98 %.  I/O:  No intake or output data in the 24 hours ending 11/04/16 1644  PHYSICAL EXAMINATION:  Physical Exam  GENERAL:  76 y.o.-year-old patient lying in the bed with no acute distress.  LUNGS: Normal breath sounds bilaterally, no wheezing, rales,rhonchi or crepitation. No use of accessory muscles of respiration.  CARDIOVASCULAR: S1, S2 normal. No murmurs, rubs, or gallops.  ABDOMEN: Soft, non-tender, non-distended. Bowel sounds present. No organomegaly or mass.  NEUROLOGIC: Moves all 4 extremities. PSYCHIATRIC: The patient is alert and oriented x 3.  SKIN: No obvious rash, lesion, or ulcer.   DATA REVIEW:   CBC  Recent Labs Lab 11/01/16 0522  WBC 13.7*  HGB 9.9*  HCT 29.2*  PLT 115*    Chemistries   Recent Labs Lab 10/31/16 1208  11/02/16 0442  NA 134*  < > 136  K 3.4*  < > 4.1  CL 101  < > 107  CO2 26  < > 25  GLUCOSE 116*  < > 161*  BUN 11  < > 12  CREATININE 0.74  < > 0.72  CALCIUM 8.4*  < > 8.1*  MG  --   < > 1.8  AST 42*  --   --   ALT 24  --   --   ALKPHOS 36*  --   --   BILITOT 0.9  --   --   < > = values in this interval not displayed.  Cardiac Enzymes No results for input(s): TROPONINI in the last 168 hours.  Microbiology Results  Results for orders placed or performed during the hospital encounter of 10/31/16  Culture, blood (Routine x 2)     Status: None (Preliminary result)   Collection Time: 10/31/16 12:09 PM  Result Value Ref Range Status   Specimen Description BLOOD R AC  Final   Special Requests   Final    BOTTLES DRAWN AEROBIC AND ANAEROBIC Blood Culture results may not be optimal due to an inadequate volume of blood received in culture bottles   Culture NO GROWTH 4 DAYS  Final   Report Status PENDING  Incomplete  Culture, blood (Routine x 2)     Status: None (Preliminary result)   Collection  Time: 10/31/16 12:09 PM  Result Value Ref Range Status    Specimen Description BLOOD R HAND  Final   Special Requests   Final    BOTTLES DRAWN AEROBIC AND ANAEROBIC Blood Culture adequate volume   Culture NO GROWTH 4 DAYS  Final   Report Status PENDING  Incomplete  Urine culture     Status: None   Collection Time: 10/31/16  5:45 PM  Result Value Ref Range Status   Specimen Description URINE, CLEAN CATCH  Final   Special Requests NONE  Final   Culture   Final    NO GROWTH Performed at [facility], [place] [address] [place], [place] [address]    Report Status 11/02/2016 FINAL  Final    RADIOLOGY:  No results found.  Follow up with PCP in 1 week.  Management plans discussed with the patient, family and they are in agreement.  CODE STATUS:  Code Status History    Date Active Date Inactive Code Status Order ID Comments User Context   10/31/2016  5:57 PM 11/02/2016  3:19 PM Full Code [id number]  Demetrios Loll, MD Inpatient   06/19/2016  1:20 PM 06/21/2016  3:29 PM DNR [id number]  Loletha Grayer, MD Inpatient   06/17/2016  2:01 AM 06/19/2016  1:20 PM Full Code [id number]  Saundra Shelling, MD ED   08/18/2015  4:28 PM 08/22/2015  4:15 PM Full Code [id number]  Lytle Butte, MD ED   08/03/2015  9:49 AM 08/07/2015  2:11 PM Full Code [id number]  Saundra Shelling, MD Inpatient    Advance Directive Documentation     Most Recent Value  Type of Advance Directive  Healthcare Power of Attorney  Pre-existing out of facility DNR order (yellow form or pink MOST form)  -  "MOST" Form in Place?  -      TOTAL TIME TAKING CARE OF THIS PATIENT ON DAY OF DISCHARGE: more than 30 minutes.   Hillary Bow R M.D on 11/04/2016 at 4:44 PM  Between 7am to 6pm - Pager - [phone]  After 6pm go to [email or web address] - password [email or web address] [email or web address]  Office  [phone]  CC: Primary care physician; [name], MD  Note: This dictation was prepared with Dragon dictation along with smaller phrase technology. Any transcriptional errors that result from  this process are unintentional.

## 2016-11-05 LAB — CULTURE, BLOOD (ROUTINE X 2)
CULTURE: NO GROWTH
CULTURE: NO GROWTH
SPECIAL REQUESTS: ADEQUATE

## 2016-11-08 NOTE — Telephone Encounter (Signed)
Pt scheduled 11/08/16 @1pm 

## 2016-11-09 ENCOUNTER — Encounter: Payer: Self-pay | Admitting: Internal Medicine

## 2016-11-09 ENCOUNTER — Ambulatory Visit (INDEPENDENT_AMBULATORY_CARE_PROVIDER_SITE_OTHER): Payer: Medicare Other | Admitting: Internal Medicine

## 2016-11-09 VITALS — BP 130/68 | HR 61 | Temp 97.8°F | Resp 18 | Wt 157.5 lb

## 2016-11-09 DIAGNOSIS — A419 Sepsis, unspecified organism: Secondary | ICD-10-CM | POA: Diagnosis not present

## 2016-11-09 DIAGNOSIS — R42 Dizziness and giddiness: Secondary | ICD-10-CM | POA: Diagnosis not present

## 2016-11-09 DIAGNOSIS — I10 Essential (primary) hypertension: Secondary | ICD-10-CM

## 2016-11-09 DIAGNOSIS — I4891 Unspecified atrial fibrillation: Secondary | ICD-10-CM | POA: Diagnosis not present

## 2016-11-09 DIAGNOSIS — R634 Abnormal weight loss: Secondary | ICD-10-CM

## 2016-11-09 DIAGNOSIS — R05 Cough: Secondary | ICD-10-CM

## 2016-11-09 DIAGNOSIS — M069 Rheumatoid arthritis, unspecified: Secondary | ICD-10-CM

## 2016-11-09 DIAGNOSIS — K219 Gastro-esophageal reflux disease without esophagitis: Secondary | ICD-10-CM

## 2016-11-09 DIAGNOSIS — D649 Anemia, unspecified: Secondary | ICD-10-CM

## 2016-11-09 DIAGNOSIS — R059 Cough, unspecified: Secondary | ICD-10-CM

## 2016-11-09 DIAGNOSIS — R945 Abnormal results of liver function studies: Secondary | ICD-10-CM

## 2016-11-09 DIAGNOSIS — J181 Lobar pneumonia, unspecified organism: Secondary | ICD-10-CM | POA: Diagnosis not present

## 2016-11-09 DIAGNOSIS — R197 Diarrhea, unspecified: Secondary | ICD-10-CM

## 2016-11-09 DIAGNOSIS — R7989 Other specified abnormal findings of blood chemistry: Secondary | ICD-10-CM

## 2016-11-09 DIAGNOSIS — J189 Pneumonia, unspecified organism: Secondary | ICD-10-CM

## 2016-11-09 LAB — BASIC METABOLIC PANEL
BUN: 10 mg/dL (ref 6–23)
CALCIUM: 9.8 mg/dL (ref 8.4–10.5)
CO2: 27 meq/L (ref 19–32)
Chloride: 93 mEq/L — ABNORMAL LOW (ref 96–112)
Creatinine, Ser: 1.03 mg/dL (ref 0.40–1.20)
GFR: 55.74 mL/min — ABNORMAL LOW (ref >60.00)
Glucose, Bld: 122 mg/dL — ABNORMAL HIGH (ref 70–99)
Potassium: 5 mEq/L (ref 3.5–5.1)
Sodium: 127 mEq/L — ABNORMAL LOW (ref 135–145)

## 2016-11-09 LAB — CBC WITH DIFFERENTIAL/PLATELET
BASOS PCT: 0.5 % (ref 0.0–3.0)
Basophils Absolute: 0 10*3/uL (ref 0.0–0.1)
EOS PCT: 1 % (ref 0.0–5.0)
Eosinophils Absolute: 0.1 10*3/uL (ref 0.0–0.7)
HCT: 35.9 % — ABNORMAL LOW (ref 36.0–46.0)
Hemoglobin: 12 g/dL (ref 12.0–15.0)
LYMPHS ABS: 2 10*3/uL (ref 0.7–4.0)
Lymphocytes Relative: 23.6 % (ref 12.0–46.0)
MCHC: 33.5 g/dL (ref 30.0–36.0)
MCV: 91.2 fl (ref 78.0–100.0)
MONO ABS: 0.6 10*3/uL (ref 0.1–1.0)
MONOS PCT: 7.4 % (ref 3.0–12.0)
NEUTROS PCT: 67.5 % (ref 43.0–77.0)
Neutro Abs: 5.8 10*3/uL (ref 1.4–7.7)
Platelets: 291 10*3/uL (ref 150.0–400.0)
RBC: 3.94 Mil/uL (ref 3.87–5.11)
RDW: 13.2 % (ref 11.5–15.5)
WBC: 8.6 10*3/uL (ref 4.0–10.5)

## 2016-11-09 LAB — HEPATIC FUNCTION PANEL
ALBUMIN: 4.2 g/dL (ref 3.5–5.2)
ALK PHOS: 41 U/L (ref 39–117)
ALT: 17 U/L (ref 0–35)
AST: 23 U/L (ref 0–37)
BILIRUBIN TOTAL: 0.5 mg/dL (ref 0.2–1.2)
Bilirubin, Direct: 0.1 mg/dL (ref 0.0–0.3)
Total Protein: 7.5 g/dL (ref 6.0–8.3)

## 2016-11-09 NOTE — Progress Notes (Signed)
Pre visit review using our clinic review tool, if applicable. No additional management support is needed unless otherwise documented below in the visit note. 

## 2016-11-09 NOTE — Progress Notes (Signed)
Patient ID: Arlene J Zulueta, female   DOB: 03/19/1941, 76 y.o.   MRN: [id number]   Subjective:    Patient ID: Maryln J Ruggles, female    DOB: 05/13/1941, 76 y.o.   MRN: [id number]  HPI  Patient here for hospital follow up.  She was admitted 10/31/16 with CAP/sepsis.  She was admitted with hypotension, tachypnea and elevated lactic acid.  Notes reviewed.  She was treated with IV abx.  Oxygen was weaned back to baseline.  She reports since discharge, she does feel better.  Breathing is better.  Some persistent cough.  Some decreased energy.  Discussed continuing robitussin, flonase and saline nasal spray.  Will need f/u cxr.  No chest pain.  No acid reflux.  No abdominal pain.  Bowels better on colestipol.  Not sleeping as well.     Past Medical History:  Diagnosis Date  . Anemia   . Cancer (HCC)   . Diverticulitis   . GERD (gastroesophageal reflux disease)   . Hyperlipidemia   . Hypertension   . Osteoarthritis   . Osteoporosis    actonel  . Positive PPD    s/p INH (2006)  . Rheumatoid arthritis(714.0)    MTX transaminitis, Leflunomide (rash), enbrel, plaquinil, prednisone, remicade, Imuran (transaminitis)  . 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 ABDOMINAL HYSTERECTOMY    . CERVICAL CONE BIOPSY    . CHOLECYSTECTOMY  06/22/14  . COLONOSCOPY WITH PROPOFOL N/A 07/09/2015   Procedure: COLONOSCOPY WITH PROPOFOL;  Surgeon: Robert T Elliott, MD;  Location: [facility] ENDOSCOPY;  Service: Endoscopy;  Laterality: N/A;  . ESOPHAGOGASTRODUODENOSCOPY (EGD) WITH PROPOFOL N/A 07/09/2015   Procedure: ESOPHAGOGASTRODUODENOSCOPY (EGD) WITH PROPOFOL;  Surgeon: Robert T Elliott, MD;  Location: [facility] ENDOSCOPY;  Service: Endoscopy;  Laterality: N/A;  . OVARY SURGERY    . TRACHEOSTOMY  1959  . TUBAL LIGATION     Family History  Problem Relation Age of Onset  . Heart disease Father     MI  . Heart disease Mother   . Valvular heart disease  Mother   . Breast cancer Sister 50  . Cancer Sister     Lung cancer  . Colon cancer Neg Hx    Social History   Social History  . Marital status: Widowed    Spouse name: N/A  . Number of children: 4  . Years of education: N/A   Occupational History  . retired    Social History Main Topics  . Smoking status: Never Smoker  . Smokeless tobacco: Never Used  . Alcohol use No  . Drug use: No  . Sexual activity: No   Other Topics Concern  . None   Social History Narrative   Lives with family at home    Outpatient Encounter Prescriptions as of 11/09/2016  Medication Sig  . acidophilus (RISAQUAD) CAPS capsule Take 1 capsule by mouth daily.  . ADVAIR DISKUS 250-50 MCG/DOSE AEPB USE 1 INHALATION EVERY 12 HOURS  . albuterol (PROAIR HFA) 108 (90 Base) MCG/ACT inhaler USE 2 INHALATIONS EVERY 6 HOURS AS NEEDED FOR WHEEZING OR SHORTNESS OF BREATH  . albuterol (PROVENTIL) (2.5 MG/3ML) 0.083% nebulizer solution Take 2.5 mg by nebulization every 6 (six) hours as needed for wheezing or shortness of breath.  . alendronate (FOSAMAX) 70 MG tablet Take 70 mg by mouth once a week.   . ALPRAZolam (XANAX) 0.25 MG tablet Take 1 tablet (0.25 mg total) by mouth daily   as needed for anxiety.  Marland Kitchen aspirin EC 81 MG tablet Take 81 mg by mouth daily.  . Calcium Carbonate-Vitamin D (CALCIUM 600+D) 600-400 MG-UNIT tablet Take 1 tablet by mouth daily.   . cetirizine (ZYRTEC) 10 MG tablet Take 1 tablet (10 mg total) by mouth daily.  . cholecalciferol (VITAMIN D) 1000 units tablet Take 1,000 Units by mouth daily.  . colestipol (COLESTID) 1 g tablet Take 2 g by mouth daily.   . Diphenhyd-Hydrocort-Nystatin (FIRST-DUKES MOUTHWASH) SUSP Pt is to swish and spit 5 cc's tid  . esomeprazole (NEXIUM) 20 MG capsule Take 20 mg by mouth daily at 12 noon.  Marland Kitchen FeFum-FePo-FA-B Cmp-C-Zn-Mn-Cu (TANDEM PLUS) 162-115.2-1 MG CAPS TAKE 1 CAPSULE DAILY  . fluticasone (FLONASE) 50 MCG/ACT nasal spray Place 2 sprays into both nostrils  daily. (Patient taking differently: Place 2 sprays into both nostrils 2 (two) times daily. )  . furosemide (LASIX) 20 MG tablet Take 1 tablet (20 mg total) by mouth daily.  Marland Kitchen gabapentin (NEURONTIN) 300 MG capsule Take 600 mg by mouth 2 (two) times daily.   Marland Kitchen guaiFENesin-dextromethorphan (ROBITUSSIN DM) 100-10 MG/5ML syrup Take 5 mLs by mouth every 4 (four) hours as needed for cough.  . hydroxychloroquine (PLAQUENIL) 200 MG tablet Take 1 tablet (200 mg total) by mouth daily.  Marland Kitchen KLOR-CON SPRINKLE 10 MEQ CR capsule TAKE 2 CAPSULES TWICE A DAY (Patient taking differently: TAKE 1 CAPSULE TWICE A DAY)  . lip balm (BLISTEX) OINT Apply 1 application topically as needed for lip care.  . meclizine (ANTIVERT) 25 MG tablet Take 1 tablet (25 mg total) by mouth 3 (three) times daily as needed for dizziness.  . metoprolol (LOPRESSOR) 50 MG tablet Take 1 tablet (50 mg total) by mouth 2 (two) times daily. (Patient taking differently: Take 50 mg by mouth daily. )  . Multiple Vitamin (MULTIVITAMIN WITH MINERALS) TABS tablet Take 1 tablet by mouth daily.  Marland Kitchen nystatin (MYCOSTATIN) 100000 UNIT/ML suspension Take 5 mLs (500,000 Units total) by mouth 4 (four) times daily.  Marland Kitchen nystatin cream (MYCOSTATIN) APPLY 1 APPLICATION TOPICALLY TO THE AFFECTED AREA (OUTER VAGINA) TWICE A DAY  . ondansetron (ZOFRAN ODT) 4 MG disintegrating tablet Take 1 tablet (4 mg total) by mouth every 8 (eight) hours as needed for nausea or vomiting.  . pramipexole (MIRAPEX) 0.25 MG tablet Take 2 tablets (0.5 mg total) by mouth 2 (two) times daily.  . predniSONE (DELTASONE) 5 MG tablet Take 5 tabs po day 1; take 4 tabs po day2; 3 tabs po day3; take 2 tabs po day2; take one tab daily afterwards (Patient taking differently: Take 5 mg by mouth daily with breakfast. )  . RABEprazole (ACIPHEX) 20 MG tablet Take 1 tablet (20 mg total) by mouth 2 (two) times daily before a meal.  . simvastatin (ZOCOR) 20 MG tablet TAKE 1 TABLET DAILY  . sodium chloride  (OCEAN) 0.65 % nasal spray Place 1 spray into the nose as needed for congestion.  Marland Kitchen SPIRIVA HANDIHALER 18 MCG inhalation capsule PLACE 1 CAPSULE INTO INHALER AND INHALE THE CONTENTS OF 1 CAPSULE DAILY  . traMADol (ULTRAM) 50 MG tablet Take 50 mg by mouth 2 (two) times daily as needed for moderate pain.  . [DISCONTINUED] levofloxacin (LEVAQUIN) 500 MG tablet Take 1 tablet (500 mg total) by mouth daily.  . meloxicam (MOBIC) 15 MG tablet Take 15 mg by mouth daily.    No facility-administered encounter medications on file as of 11/09/2016.     Review of Systems  Constitutional:  Positive for fatigue.       Appetite improving.    HENT: Negative for postnasal drip and sinus pressure.   Respiratory: Positive for cough. Negative for chest tightness and shortness of breath.   Cardiovascular: Negative for chest pain, palpitations and leg swelling.  Gastrointestinal: Negative for abdominal pain, nausea and vomiting.       Bowels better on colestipol.    Genitourinary: Negative for difficulty urinating and dysuria.  Musculoskeletal: Negative for joint swelling and myalgias.  Skin: Negative for color change and rash.  Neurological: Negative for light-headedness and headaches.       Some persistent dizziness.  Rescheduled her neurology appt.    Psychiatric/Behavioral: Positive for sleep disturbance. Negative for agitation and dysphoric mood.       Objective:    Physical Exam  Constitutional: She appears well-developed and well-nourished. No distress.  HENT:  Nose: Nose normal.  Mouth/Throat: Oropharynx is clear and moist.  Neck: Neck supple. No thyromegaly present.  Cardiovascular: Normal rate and regular rhythm.   Pulmonary/Chest: Breath sounds normal. No respiratory distress. She has no wheezes.  Abdominal: Soft. Bowel sounds are normal. There is no tenderness.  Musculoskeletal: She exhibits no edema or tenderness.  Lymphadenopathy:    She has no cervical adenopathy.  Skin: No rash noted. No  erythema.  Psychiatric: Her behavior is normal.    BP 130/68 (BP Location: Left Arm, Patient Position: Sitting, Cuff Size: Normal)   Pulse 61   Temp 97.8 F (36.6 C) (Oral)   Resp 18   Wt 157 lb 8 oz (71.4 kg)   SpO2 98%   BMI 27.03 kg/m  Wt Readings from Last 3 Encounters:  11/09/16 157 lb 8 oz (71.4 kg)  10/31/16 168 lb 9.6 oz (76.5 kg)  09/29/16 162 lb 6.4 oz (73.7 kg)     Lab Results  Component Value Date   WBC 8.6 11/09/2016   HGB 12.0 11/09/2016   HCT 35.9 (L) 11/09/2016   PLT 291.0 11/09/2016   GLUCOSE 130 (H) 11/11/2016   CHOL 115 06/28/2016   TRIG 72.0 06/28/2016   HDL 60.90 06/28/2016   LDLDIRECT 73.7 08/20/2013   LDLCALC 40 06/28/2016   ALT 17 11/09/2016   AST 23 11/09/2016   NA 131 (L) 11/11/2016   K 4.4 11/11/2016   CL 97 11/11/2016   CREATININE 1.03 11/11/2016   BUN 12 11/11/2016   CO2 27 11/11/2016   TSH 1.23 09/29/2016   INR 1.28 10/31/2016    Dg Chest 2 View  Result Date: 10/31/2016 CLINICAL DATA:  Patient from home via [facility]. Reports fever and generalized body aches starting today. Patient also reports productive cough beginning this morning. EXAM: CHEST  2 VIEW COMPARISON:  07/29/2016 FINDINGS: Patchy right lower lobe airspace disease. No pleural effusion or pneumothorax. Stable cardiomegaly. No acute osseous abnormality. IMPRESSION: Right lower lobe airspace disease concerning for pneumonia. Followup PA and lateral chest X-ray is recommended in 3-4 weeks following trial of antibiotic therapy to ensure resolution and exclude underlying malignancy. Electronically Signed   By: Kathreen Devoid   On: 10/31/2016 13:11       Assessment & Plan:   Problem List Items Addressed This Visit    A-fib ([place])    In SR.  On metoprolol now and only taking 45m q day.  Follow.        Anemia - Primary    Recent hgb wnl.  Follow.        Relevant Orders   CBC with  Differential/Platelet (Completed)   CAP (community acquired pneumonia)    Recently admitted.   Doing better.  Will need f/u cxr to confirm clearance.        Cough    Persistent cough.  Saline nasal spray, flonase and robitussin.  Follow.        Diarrhea    Better on colestipol.  Seeing GI.        Dizziness    Persistent dizziness and light headedness.  See previous note.  Followed by ENT.  Needs to reschedule f/u.  Had recommended vestibular rehab.  She has rescheduled her neurology appt.        Relevant Orders   Ambulatory referral to ENT   GERD (gastroesophageal reflux disease)    Controlled on current regimen.  Follow.        Hypertension    Blood pressure under good control.  Continue same medication regimen.  Follow pressures.  Follow metabolic panel.        Relevant Orders   Basic metabolic panel (Completed)   Rheumatoid arthritis ([place])    Followed by Dr [name].        Sepsis ([place])    Recently admitted.  CAP.  Treated with IV abx. Doing better.  Follow.  Recheck cbc and met b.        Weight loss    Some weight loss.  Recently hospitalized.  Appetite improved.  Follow.         Other Visit Diagnoses    Abnormal liver function test       Relevant Orders   Hepatic function panel (Completed)       [name], MD

## 2016-11-10 ENCOUNTER — Other Ambulatory Visit: Payer: Self-pay | Admitting: Internal Medicine

## 2016-11-10 DIAGNOSIS — E871 Hypo-osmolality and hyponatremia: Secondary | ICD-10-CM

## 2016-11-10 NOTE — Progress Notes (Signed)
Order placed for f/u met b.  

## 2016-11-11 ENCOUNTER — Telehealth: Payer: Self-pay | Admitting: Radiology

## 2016-11-11 ENCOUNTER — Other Ambulatory Visit (INDEPENDENT_AMBULATORY_CARE_PROVIDER_SITE_OTHER): Payer: Medicare Other

## 2016-11-11 ENCOUNTER — Other Ambulatory Visit: Payer: Self-pay | Admitting: Internal Medicine

## 2016-11-11 DIAGNOSIS — E871 Hypo-osmolality and hyponatremia: Secondary | ICD-10-CM

## 2016-11-11 LAB — BASIC METABOLIC PANEL
BUN: 12 mg/dL (ref 6–23)
CHLORIDE: 97 meq/L (ref 96–112)
CO2: 27 mEq/L (ref 19–32)
Calcium: 9.5 mg/dL (ref 8.4–10.5)
Creatinine, Ser: 1.03 mg/dL (ref 0.40–1.20)
GFR: 55.74 mL/min — ABNORMAL LOW (ref >60.00)
Glucose, Bld: 130 mg/dL — ABNORMAL HIGH (ref 70–99)
POTASSIUM: 4.4 meq/L (ref 3.5–5.1)
SODIUM: 131 meq/L — AB (ref 135–145)

## 2016-11-11 NOTE — Telephone Encounter (Signed)
Opened in Error.

## 2016-11-11 NOTE — Telephone Encounter (Signed)
Pt came in for labs today, saw order for BMP and sodium. Did you just want the sodium by itself or BMP?

## 2016-11-11 NOTE — Progress Notes (Signed)
Order placed for f/u sodium check.   

## 2016-11-11 NOTE — Telephone Encounter (Signed)
Told Kaitlyn Huff - met b.  Thanks.

## 2016-11-12 ENCOUNTER — Other Ambulatory Visit: Payer: Self-pay | Admitting: Internal Medicine

## 2016-11-13 ENCOUNTER — Encounter: Payer: Self-pay | Admitting: Internal Medicine

## 2016-11-13 NOTE — Assessment & Plan Note (Signed)
Persistent dizziness and light headedness.  See previous note.  Followed by ENT.  Needs to reschedule f/u.  Had recommended vestibular rehab.  She has rescheduled her neurology appt.

## 2016-11-13 NOTE — Assessment & Plan Note (Signed)
Better on colestipol.  Seeing GI.

## 2016-11-13 NOTE — Assessment & Plan Note (Signed)
Blood pressure under good control.  Continue same medication regimen.  Follow pressures.  Follow metabolic panel.   

## 2016-11-13 NOTE — Assessment & Plan Note (Signed)
Persistent cough.  Saline nasal spray, flonase and robitussin.  Follow.

## 2016-11-13 NOTE — Assessment & Plan Note (Signed)
Recently admitted.  CAP.  Treated with IV abx. Doing better.  Follow.  Recheck cbc and met b.   

## 2016-11-13 NOTE — Assessment & Plan Note (Signed)
Followed by Dr Kernodle.   

## 2016-11-13 NOTE — Assessment & Plan Note (Signed)
In SR.  On metoprolol now and only taking 50mg  q day.  Follow.

## 2016-11-13 NOTE — Assessment & Plan Note (Signed)
Recently admitted.  Doing better.  Will need f/u cxr to confirm clearance.

## 2016-11-13 NOTE — Assessment & Plan Note (Signed)
Recent hgb wnl.  Follow.   

## 2016-11-13 NOTE — Assessment & Plan Note (Signed)
Some weight loss.  Recently hospitalized.  Appetite improved.  Follow.

## 2016-11-13 NOTE — Assessment & Plan Note (Signed)
Controlled on current regimen.  Follow.  

## 2016-11-14 DIAGNOSIS — Z79899 Other long term (current) drug therapy: Secondary | ICD-10-CM | POA: Diagnosis not present

## 2016-11-14 DIAGNOSIS — H3530 Unspecified macular degeneration: Secondary | ICD-10-CM | POA: Diagnosis not present

## 2016-11-16 ENCOUNTER — Other Ambulatory Visit: Payer: Self-pay | Admitting: Internal Medicine

## 2016-11-16 ENCOUNTER — Other Ambulatory Visit (INDEPENDENT_AMBULATORY_CARE_PROVIDER_SITE_OTHER): Payer: Medicare Other

## 2016-11-16 DIAGNOSIS — E871 Hypo-osmolality and hyponatremia: Secondary | ICD-10-CM

## 2016-11-16 LAB — SODIUM: SODIUM: 131 meq/L — AB (ref 135–145)

## 2016-11-19 ENCOUNTER — Emergency Department
Admission: EM | Admit: 2016-11-19 | Discharge: 2016-11-19 | Disposition: A | Discharge status: Home / Self Care | Payer: Medicare Other | Attending: Emergency Medicine | Admitting: Emergency Medicine

## 2016-11-19 ENCOUNTER — Encounter: Payer: Self-pay | Admitting: Emergency Medicine

## 2016-11-19 DIAGNOSIS — Z7982 Long term (current) use of aspirin: Secondary | ICD-10-CM | POA: Insufficient documentation

## 2016-11-19 DIAGNOSIS — Z79899 Other long term (current) drug therapy: Secondary | ICD-10-CM | POA: Diagnosis not present

## 2016-11-19 DIAGNOSIS — K2900 Acute gastritis without bleeding: Secondary | ICD-10-CM | POA: Insufficient documentation

## 2016-11-19 DIAGNOSIS — R112 Nausea with vomiting, unspecified: Secondary | ICD-10-CM | POA: Diagnosis not present

## 2016-11-19 DIAGNOSIS — I10 Essential (primary) hypertension: Secondary | ICD-10-CM | POA: Insufficient documentation

## 2016-11-19 DIAGNOSIS — R531 Weakness: Secondary | ICD-10-CM | POA: Diagnosis not present

## 2016-11-19 LAB — COMPREHENSIVE METABOLIC PANEL
ALK PHOS: 44 U/L (ref 38–126)
ALT: 20 U/L (ref 14–54)
AST: 40 U/L (ref 15–41)
Albumin: 4.1 g/dL (ref 3.5–5.0)
Anion gap: 11 (ref 5–15)
BILIRUBIN TOTAL: 1.2 mg/dL (ref 0.3–1.2)
BUN: 10 mg/dL (ref 6–20)
CALCIUM: 9.2 mg/dL (ref 8.9–10.3)
CO2: 25 mmol/L (ref 22–32)
Chloride: 96 mmol/L — ABNORMAL LOW (ref 101–111)
Creatinine, Ser: 1.08 mg/dL — ABNORMAL HIGH (ref 0.44–1.00)
GFR calc non Af Amer: 50 mL/min — ABNORMAL LOW (ref >60)
GFR, EST AFRICAN AMERICAN: 58 mL/min — AB (ref >60)
GLUCOSE: 93 mg/dL (ref 65–99)
Potassium: 4.1 mmol/L (ref 3.5–5.1)
SODIUM: 132 mmol/L — AB (ref 135–145)
TOTAL PROTEIN: 7.5 g/dL (ref 6.5–8.1)

## 2016-11-19 LAB — CBC
HEMATOCRIT: 34 % — AB (ref 35.0–47.0)
HEMOGLOBIN: 12.1 g/dL (ref 12.0–16.0)
MCH: 31.6 pg (ref 26.0–34.0)
MCHC: 35.7 g/dL (ref 32.0–36.0)
MCV: 88.6 fL (ref 80.0–100.0)
Platelets: 272 10*3/uL (ref 150–440)
RBC: 3.84 MIL/uL (ref 3.80–5.20)
RDW: 12.8 % (ref 11.5–14.5)
WBC: 7.9 10*3/uL (ref 3.6–11.0)

## 2016-11-19 LAB — URINALYSIS, COMPLETE (UACMP) WITH MICROSCOPIC
BACTERIA UA: NONE SEEN
Bilirubin Urine: NEGATIVE
Glucose, UA: NEGATIVE mg/dL
Hgb urine dipstick: NEGATIVE
Ketones, ur: NEGATIVE mg/dL
Leukocytes, UA: NEGATIVE
Nitrite: NEGATIVE
PROTEIN: NEGATIVE mg/dL
SPECIFIC GRAVITY, URINE: 1.009 (ref 1.005–1.030)
Squamous Epithelial / LPF: NONE SEEN
pH: 8 (ref 5.0–8.0)

## 2016-11-19 LAB — TROPONIN I: Troponin I: 0.05 ng/mL (ref <0.03)

## 2016-11-19 MED ORDER — ONDANSETRON HCL 4 MG/2ML IJ SOLN
4.0000 mg | Freq: Once | INTRAMUSCULAR | Status: AC
  Administered 2016-11-19: 4 mg via INTRAVENOUS
  Filled 2016-11-19: qty 2

## 2016-11-19 MED ORDER — SODIUM CHLORIDE 0.9 % IV SOLN
1000.0000 mL | Freq: Once | INTRAVENOUS | Status: AC
  Administered 2016-11-19: 1000 mL via INTRAVENOUS

## 2016-11-19 MED ORDER — ONDANSETRON 4 MG PO TBDP: 4.0000 mg | ORAL_TABLET | Freq: Three times a day (TID) | ORAL | 0 refills | Status: DC | PRN

## 2016-11-19 NOTE — ED Triage Notes (Signed)
Pt was constipated yesterday and vomited while trying to go. Now has diarrhea today with nausea. States chronic nausea that used to be under control but got worse after having pneumonia 2 weeks ago. Unable to take morning meds except for nausea and xanax.

## 2016-11-19 NOTE — ED Notes (Signed)
Troponin of 0.05 reported to Dr. Corky Downs.

## 2016-11-19 NOTE — ED Provider Notes (Signed)
[facility] Emergency Department Provider Note   ____________________________________________    I have reviewed the triage vital signs and the nursing notes.   HISTORY  Chief Complaint Nausea and Diarrhea     HPI ROSIBEL Huff is a 76 y.o. female who presents with complaints of nausea and diarrhea. Patient reports she frequently gets "spells" over the last year where she develops nausea vomiting and diarrhea. She does report cramping when she is having diarrhea but no abdominal pain currently. Denies fevers or chills. No recent travel. No sick contacts. Vomitus and stool is nonbilious nonbloody. She feels dehydrated and weak.   Past Medical History:  Diagnosis Date  . Anemia   . Cancer ([place])   . Diverticulitis   . GERD (gastroesophageal reflux disease)   . Hyperlipidemia   . Hypertension   . Osteoarthritis   . Osteoporosis    actonel  . Positive PPD    s/p INH (2006)  . Rheumatoid arthritis(714.0)    MTX transaminitis, Leflunomide (rash), enbrel, plaquinil, prednisone, remicade, Imuran (transaminitis)  . Valvular heart disease    moderate MR and TR    Patient Active Problem List   Diagnosis Date Noted  . Dizziness 10/01/2016  . A-fib ([place]) 07/12/2016  . Pneumonia 06/17/2016  . 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 Chronic venous insufficiency 04/25/2016  . Pain in limb 04/25/2016  . Swelling of limb 04/25/2016  . Tachycardia 03/22/2016  . Foot fracture 03/18/2016  . Cough 01/27/2016  . Hypoxia 12/20/2015  . Lower extremity edema 12/18/2015  . Diarrhea 09/15/2015  . CAP (community acquired pneumonia) 08/30/2015  . Sepsis ([place]) 08/18/2015  . Restless leg syndrome 08/07/2015  . History of colonic polyps 07/10/2015  . Health care maintenance 01/11/2015  . Weight loss 08/04/2013  . Obstructive sleep apnea 12/23/2012  . Dilated bile duct 12/23/2012  . Hypercholesterolemia 08/12/2012  .  Rheumatoid arthritis ([place]) 08/12/2012  . GERD (gastroesophageal reflux disease) 08/12/2012  . Hypertension 08/12/2012  . Anemia 08/12/2012    Past Surgical History:  Procedure Laterality Date  . ABDOMINAL HYSTERECTOMY    . CERVICAL CONE BIOPSY    . CHOLECYSTECTOMY  06/22/14  . COLONOSCOPY WITH PROPOFOL N/A 07/09/2015   Procedure: COLONOSCOPY WITH PROPOFOL;  Surgeon: Manya Silvas, MD;  Location: [facility] ENDOSCOPY;  Service: Endoscopy;  Laterality: N/A;  . ESOPHAGOGASTRODUODENOSCOPY (EGD) WITH PROPOFOL N/A 07/09/2015   Procedure: ESOPHAGOGASTRODUODENOSCOPY (EGD) WITH PROPOFOL;  Surgeon: Manya Silvas, MD;  Location: [facility] ENDOSCOPY;  Service: Endoscopy;  Laterality: N/A;  . OVARY SURGERY    . TRACHEOSTOMY  1959  . TUBAL LIGATION      Prior to Admission medications   Medication Sig Start Date End Date Taking? Authorizing Provider  acidophilus (RISAQUAD) CAPS capsule Take 1 capsule by mouth daily.    [provider]  ADVAIR DISKUS 250-50 MCG/DOSE AEPB USE 1 INHALATION EVERY 12 HOURS 07/07/16   [name], MD  albuterol [facility] HFA) 108 (90 Base) MCG/ACT inhaler USE 2 INHALATIONS EVERY 6 HOURS AS NEEDED FOR WHEEZING OR SHORTNESS OF BREATH 10/19/16   [name], MD  albuterol (PROVENTIL) (2.5 MG/3ML) 0.083% nebulizer solution Take 2.5 mg by nebulization every 6 (six) hours as needed for wheezing or shortness of breath.    [provider]  alendronate (FOSAMAX) 70 MG tablet Take 70 mg by mouth once a week.  09/04/15   [provider]  ALPRAZolam [name]) 0.25 MG tablet Take 1 tablet (0.25 mg  total) by mouth daily as needed for anxiety. 10/19/16   [name], MD  aspirin EC 81 MG tablet Take 81 mg by mouth daily.    [provider]  Calcium Carbonate-Vitamin D (CALCIUM 600+D) 600-400 MG-UNIT tablet Take 1 tablet by mouth daily.     [provider]  cetirizine (ZYRTEC) 10 MG tablet Take 1 tablet (10 mg total) by mouth daily. 07/01/16    [name], MD  cholecalciferol (VITAMIN D) 1000 units tablet Take 1,000 Units by mouth daily.    [provider]  colestipol (COLESTID) 1 g tablet Take 2 g by mouth daily.     [provider]  Diphenhyd-Hydrocort-Nystatin (FIRST-DUKES MOUTHWASH) SUSP Pt is to swish and spit 5 cc's tid 06/06/16   Burnard Hawthorne, FNP  esomeprazole (NEXIUM) 20 MG capsule Take 20 mg by mouth daily at 12 noon.    [provider]  FeFum-FePo-FA-B Cmp-C-Zn-Mn-Cu (TANDEM PLUS) 162-115.2-1 MG CAPS TAKE 1 CAPSULE DAILY 11/03/16   [name], MD  fluticasone (FLONASE) 50 MCG/ACT nasal spray Place 2 sprays into both nostrils daily. Patient taking differently: Place 2 sprays into both nostrils 2 (two) times daily.  01/27/16   Leone Haven, MD  furosemide (LASIX) 20 MG tablet Take 1 tablet (20 mg total) by mouth daily. 05/31/16   [name], MD  gabapentin (NEURONTIN) 300 MG capsule Take 600 mg by mouth 2 (two) times daily.     [provider]  guaiFENesin-dextromethorphan (ROBITUSSIN DM) 100-10 MG/5ML syrup Take 5 mLs by mouth every 4 (four) hours as needed for cough. 08/07/15   Theodoro Grist, MD  hydroxychloroquine (PLAQUENIL) 200 MG tablet Take 1 tablet (200 mg total) by mouth daily. 06/21/16   Loletha Grayer, MD  KLOR-CON SPRINKLE 10 MEQ CR capsule TAKE 2 CAPSULES TWICE A DAY Patient taking differently: TAKE 1 CAPSULE TWICE A DAY 06/09/16   [name], MD  lip balm (BLISTEX) OINT Apply 1 application topically as needed for lip care. 06/21/16   Loletha Grayer, MD  meclizine (ANTIVERT) 25 MG tablet Take 1 tablet (25 mg total) by mouth 3 (three) times daily as needed for dizziness. 08/09/16   Nance Pear, MD  meloxicam (MOBIC) 15 MG tablet Take 15 mg by mouth daily.     [provider]  metoprolol (LOPRESSOR) 50 MG tablet Take 1 tablet (50 mg total) by mouth 2 (two) times daily. Patient taking differently: Take 50 mg by mouth daily.  07/19/16    [name], MD  Multiple Vitamin (MULTIVITAMIN WITH MINERALS) TABS tablet Take 1 tablet by mouth daily.    [provider]  nystatin (MYCOSTATIN) 100000 UNIT/ML suspension Take 5 mLs (500,000 Units total) by mouth 4 (four) times daily. 06/21/16   Loletha Grayer, MD  nystatin cream (MYCOSTATIN) APPLY 1 APPLICATION TOPICALLY TO THE AFFECTED AREA (OUTER VAGINA) TWICE A DAY 05/30/16   [provider]  ondansetron (ZOFRAN ODT) 4 MG disintegrating tablet Take 1 tablet (4 mg total) by mouth every 8 (eight) hours as needed for nausea or vomiting. 11/19/16   Lavonia Drafts, MD  pramipexole (MIRAPEX) 0.25 MG tablet Take 2 tablets (0.5 mg total) by mouth 2 (two) times daily. 10/19/16   [name], MD  predniSONE (DELTASONE) 5 MG tablet Take 5 tabs po day 1; take 4 tabs po day2; 3 tabs po day3; take 2 tabs po day2; take one tab daily afterwards Patient taking differently: Take 5 mg by mouth daily with breakfast.  06/21/16   Loletha Grayer,  MD  RABEprazole (ACIPHEX) 20 MG tablet Take 1 tablet (20 mg total) by mouth 2 (two) times daily before a meal. 04/08/16   [name], MD  simvastatin (ZOCOR) 20 MG tablet TAKE 1 TABLET DAILY 11/16/16   [name], MD  sodium chloride (OCEAN) 0.65 % nasal spray Place 1 spray into the nose as needed for congestion.    [provider]  SPIRIVA HANDIHALER 18 MCG inhalation capsule INHALE THE CONTENTS OF 1 CAPSULE DAILY 11/14/16   [name], MD  traMADol (ULTRAM) 50 MG tablet Take 50 mg by mouth 2 (two) times daily as needed for moderate pain.    [provider]     Allergies Astelin [azelastine hcl]; Ciprofloxacin; Codeine; Dilaudid [hydromorphone]; Flexeril [cyclobenzaprine]; Imuran [azathioprine]; Keflex [cephalexin]; Lisinopril; Lyrica [pregabalin]; Methotrexate derivatives; Amoxicillin; Arava [leflunomide]; Clindamycin/lincomycin; Doxycycline; Lodine [etodolac]; Percocet [oxycodone-acetaminophen]; and Sulfa  antibiotics  Family History  Problem Relation Age of Onset  . Heart disease Father        MI  . Heart disease Mother   . Valvular heart disease Mother   . Breast cancer Sister 74  . Cancer Sister        Lung cancer  . Colon cancer Neg Hx     Social History Social History  Substance Use Topics  . Smoking status: Never Smoker  . Smokeless tobacco: Never Used  . Alcohol use No    Review of Systems  Constitutional: No fever/chills Eyes: No visual changes.  ENT: No sore throat. Cardiovascular: Denies chest pain. Respiratory: Denies shortness of breath. Gastrointestinal: No abdominal pain.  As above  Genitourinary: Negative for dysuria. Musculoskeletal: Negative for back pain. Skin: Negative for rash. Neurological: Negative for headaches or weakness   ____________________________________________   PHYSICAL EXAM:  VITAL SIGNS: ED Triage Vitals  Enc Vitals Group     BP 11/19/16 1238 123/67     Pulse Rate 11/19/16 1238 65     Resp 11/19/16 1238 18     Temp 11/19/16 1242 97.9 F (36.6 C)     Temp Source 11/19/16 1242 Oral     SpO2 11/19/16 1238 100 %     Weight 11/19/16 1232 157 lb (71.2 kg)     Height 11/19/16 1232 5\' 4"  (1.626 m)     Head Circumference --      Peak Flow --      Pain Score 11/19/16 1231 5     Pain Loc --      Pain Edu? --      Excl. in [place]? --     Constitutional: Alert and oriented. No acute distress. Pleasant and interactive Eyes: Conjunctivae are normal.  Head: Atraumatic. Nose: No congestion/rhinnorhea. Mouth/Throat: Mucous membranes are moist.   Neck:  Painless ROM Cardiovascular: Normal rate, regular rhythm. Grossly normal heart sounds.  Good peripheral circulation. Respiratory: Normal respiratory effort.  No retractions. Lungs CTAB. Gastrointestinal: Soft and nontender. No distention.  No CVA tenderness. Genitourinary: deferred Musculoskeletal: No lower extremity tenderness nor edema.  Warm and well perfused Neurologic:  Normal  speech and language. No gross focal neurologic deficits are appreciated.  Skin:  Skin is warm, dry and intact. No rash noted. Psychiatric: Mood and affect are normal. Speech and behavior are normal.  ____________________________________________   LABS (all labs ordered are listed, but only abnormal results are displayed)  Labs Reviewed  CBC - Abnormal; Notable for the following:       Result Value   HCT 34.0 (*)    All other components  within normal limits  COMPREHENSIVE METABOLIC PANEL - Abnormal; Notable for the following:    Sodium 132 (*)    Chloride 96 (*)    Creatinine, Ser 1.08 (*)    GFR calc non Af Amer 50 (*)    GFR calc Af Amer 58 (*)    All other components within normal limits  TROPONIN I - Abnormal; Notable for the following:    Troponin I 0.05 (*)    All other components within normal limits  URINALYSIS, COMPLETE (UACMP) WITH MICROSCOPIC - Abnormal; Notable for the following:    Color, Urine YELLOW (*)    APPearance CLEAR (*)    All other components within normal limits   ____________________________________________  EKG  ED ECG REPORT I, Lavonia Drafts, the attending physician, personally viewed and interpreted this ECG.  Date: 11/19/2016  Rate: 1316 Rhythm: normal sinus rhythm QRS Axis: normal Intervals: normal ST/T Wave abnormalities: normal Conduction Disturbances: none Narrative Interpretation: unremarkable  ____________________________________________  RADIOLOGY  None ____________________________________________   PROCEDURES  Procedure(s) performed: No    Critical Care performed: No ____________________________________________   INITIAL IMPRESSION / ASSESSMENT AND PLAN / ED COURSE  Pertinent labs & imaging results that were available during my care of the patient were reviewed by me and considered in my medical decision making (see chart for details).  Patient presents with complaints of nausea vomiting and diarrhea. She  reports this is been recurrently occurring over the last year but she does not know what is causing it. She denies fevers or chills. No recent travel as above. We will treat with IV fluids and Zofran check labs and reevaluate. She is anxious to go home for Mother's Day  ----------------------------------------- 3:15 PM on 11/19/2016 -----------------------------------------  Patient reports feeling better and would like to be discharged. I think this is reasonable. Her troponin is always slightly elevated, she has no chest pain and EKG unremarkable ____________________________________________   FINAL CLINICAL IMPRESSION(S) / ED DIAGNOSES  Final diagnoses:  Acute gastritis without hemorrhage, unspecified gastritis type      NEW MEDICATIONS STARTED DURING THIS VISIT:  Current Discharge Medication List       Note:  This document was prepared using Dragon voice recognition software and may include unintentional dictation errors.    Lavonia Drafts, MD 11/19/16 [phone]

## 2016-11-22 DIAGNOSIS — R42 Dizziness and giddiness: Secondary | ICD-10-CM | POA: Diagnosis not present

## 2016-11-22 DIAGNOSIS — G2581 Restless legs syndrome: Secondary | ICD-10-CM | POA: Diagnosis not present

## 2016-11-22 DIAGNOSIS — R9082 White matter disease, unspecified: Secondary | ICD-10-CM | POA: Diagnosis not present

## 2016-11-22 DIAGNOSIS — G609 Hereditary and idiopathic neuropathy, unspecified: Secondary | ICD-10-CM | POA: Diagnosis not present

## 2016-11-23 DIAGNOSIS — G609 Hereditary and idiopathic neuropathy, unspecified: Secondary | ICD-10-CM | POA: Diagnosis not present

## 2016-11-23 DIAGNOSIS — E785 Hyperlipidemia, unspecified: Secondary | ICD-10-CM | POA: Diagnosis not present

## 2016-11-23 DIAGNOSIS — R42 Dizziness and giddiness: Secondary | ICD-10-CM | POA: Diagnosis not present

## 2016-11-24 DIAGNOSIS — I480 Paroxysmal atrial fibrillation: Secondary | ICD-10-CM | POA: Diagnosis not present

## 2016-11-24 DIAGNOSIS — E782 Mixed hyperlipidemia: Secondary | ICD-10-CM | POA: Diagnosis not present

## 2016-11-24 DIAGNOSIS — I059 Rheumatic mitral valve disease, unspecified: Secondary | ICD-10-CM | POA: Diagnosis not present

## 2016-11-24 DIAGNOSIS — I10 Essential (primary) hypertension: Secondary | ICD-10-CM | POA: Diagnosis not present

## 2016-11-30 DIAGNOSIS — R682 Dry mouth, unspecified: Secondary | ICD-10-CM | POA: Diagnosis not present

## 2016-11-30 DIAGNOSIS — R42 Dizziness and giddiness: Secondary | ICD-10-CM | POA: Diagnosis not present

## 2016-12-02 ENCOUNTER — Telehealth: Payer: Self-pay | Admitting: Internal Medicine

## 2016-12-02 NOTE — Telephone Encounter (Signed)
Pt called, Dr. Tami Ribas told her to stop taking one of her medications. She is a little confused on which one and should she really stop taking any of her medications. Please call her at [phone].

## 2016-12-06 ENCOUNTER — Ambulatory Visit (INDEPENDENT_AMBULATORY_CARE_PROVIDER_SITE_OTHER): Payer: Medicare Other

## 2016-12-06 ENCOUNTER — Other Ambulatory Visit: Payer: Self-pay | Admitting: Internal Medicine

## 2016-12-06 DIAGNOSIS — R9389 Abnormal findings on diagnostic imaging of other specified body structures: Secondary | ICD-10-CM

## 2016-12-06 DIAGNOSIS — R42 Dizziness and giddiness: Secondary | ICD-10-CM | POA: Diagnosis not present

## 2016-12-06 DIAGNOSIS — R918 Other nonspecific abnormal finding of lung field: Secondary | ICD-10-CM | POA: Diagnosis not present

## 2016-12-06 DIAGNOSIS — R938 Abnormal findings on diagnostic imaging of other specified body structures: Secondary | ICD-10-CM | POA: Diagnosis not present

## 2016-12-06 NOTE — Telephone Encounter (Signed)
Left message to return call to our office.  

## 2016-12-06 NOTE — Progress Notes (Signed)
Order placed for f/u cxr.  

## 2016-12-07 NOTE — Telephone Encounter (Signed)
Notes received put in your green folder for you to review.

## 2016-12-07 NOTE — Telephone Encounter (Signed)
Spoke to [facility] she will fax over now.

## 2016-12-07 NOTE — Telephone Encounter (Signed)
Please call ENT and get them to fax note or get info asap.  Please hold message until we get information.  Need today.

## 2016-12-07 NOTE — Telephone Encounter (Signed)
I spoke to patient states that was seen by ENT office and was told to d/c several meds. She states that Dr. Tami Ribas sent you a message about this. Do you know anything about it? She did have her xray yesterday. Not feeling any better. She has not d/c meds yet until she hears from our office and Dr. [name] about prednisone.

## 2016-12-08 NOTE — Telephone Encounter (Signed)
I reviewed his note.  He had mentioned possibly adjusting dose of toprol.  She needs to be seen.  Will need to check her blood pressure and pulse and  Determine if any changes necessary.  Have her spot check her pressure and bring in readings.  Can put her in hold spot on 12/13/16.

## 2016-12-09 DIAGNOSIS — R42 Dizziness and giddiness: Secondary | ICD-10-CM | POA: Diagnosis not present

## 2016-12-09 NOTE — Telephone Encounter (Signed)
Spoke to patient app made for 12/13/16 she will continue on all meds until then.

## 2016-12-09 NOTE — Telephone Encounter (Signed)
Called patient n/a no v/m  

## 2016-12-12 DIAGNOSIS — M0579 Rheumatoid arthritis with rheumatoid factor of multiple sites without organ or systems involvement: Secondary | ICD-10-CM | POA: Diagnosis not present

## 2016-12-13 ENCOUNTER — Ambulatory Visit (INDEPENDENT_AMBULATORY_CARE_PROVIDER_SITE_OTHER): Payer: Medicare Other | Admitting: Internal Medicine

## 2016-12-13 ENCOUNTER — Encounter: Payer: Self-pay | Admitting: Internal Medicine

## 2016-12-13 VITALS — BP 124/50 | HR 62 | Temp 97.8°F | Resp 12 | Ht 64.0 in | Wt 158.6 lb

## 2016-12-13 DIAGNOSIS — R42 Dizziness and giddiness: Secondary | ICD-10-CM

## 2016-12-13 DIAGNOSIS — E7800 Pure hypercholesterolemia, unspecified: Secondary | ICD-10-CM

## 2016-12-13 DIAGNOSIS — E871 Hypo-osmolality and hyponatremia: Secondary | ICD-10-CM

## 2016-12-13 DIAGNOSIS — K219 Gastro-esophageal reflux disease without esophagitis: Secondary | ICD-10-CM | POA: Diagnosis not present

## 2016-12-13 DIAGNOSIS — I10 Essential (primary) hypertension: Secondary | ICD-10-CM | POA: Diagnosis not present

## 2016-12-13 DIAGNOSIS — R197 Diarrhea, unspecified: Secondary | ICD-10-CM

## 2016-12-13 DIAGNOSIS — M069 Rheumatoid arthritis, unspecified: Secondary | ICD-10-CM

## 2016-12-13 DIAGNOSIS — I4891 Unspecified atrial fibrillation: Secondary | ICD-10-CM

## 2016-12-13 DIAGNOSIS — G2581 Restless legs syndrome: Secondary | ICD-10-CM | POA: Diagnosis not present

## 2016-12-13 DIAGNOSIS — D649 Anemia, unspecified: Secondary | ICD-10-CM

## 2016-12-13 LAB — BASIC METABOLIC PANEL
BUN: 15 mg/dL (ref 6–23)
CALCIUM: 9.4 mg/dL (ref 8.4–10.5)
CO2: 31 meq/L (ref 19–32)
Chloride: 98 mEq/L (ref 96–112)
Creatinine, Ser: 0.95 mg/dL (ref 0.40–1.20)
GFR: 61.18 mL/min (ref >60.00)
Glucose, Bld: 116 mg/dL — ABNORMAL HIGH (ref 70–99)
Potassium: 3.8 mEq/L (ref 3.5–5.1)
SODIUM: 137 meq/L (ref 135–145)

## 2016-12-13 NOTE — Progress Notes (Signed)
Patient ID: Kaitlyn Huff, female   DOB: 10-17-1940, 76 y.o.   MRN: [id number]   Subjective:    Patient ID: Kaitlyn Huff, female    DOB: 05/28/1941, 76 y.o.   MRN: [id number]  HPI  Patient here as a work in to discuss possibly changing her blood pressure medication.  She was recently evaluated by Dr Tami Ribas for dizziness.  Note reviewed.  He was concerned that her metoprolol was contributing to her dizziness.  She has had persistent intermittent dizziness/light headedness.  Saw Dr Manuella Ghazi.  Felt to be multifactorial.  Persistent imbalance and unsteadiness in patient with peripheral neuropathy, moderate microvascular white matter ischemic changes and recent history of head trauma.  Planning for NCS.  Had recent MRI (06/2016).  Dr Manuella Ghazi reviewed.  Recommended continuing daily aspirin.  On questioning, she reports just intermittent light headedness.  Notices if stands quickly.  No headache.  No chest pain.  No sob.   She does report persistent intermittent GI flares.  Describes some intermittent abdominal discomfort with associated diarrhea.  Will feel sick and then emesis.  May occur 2-3x/week.  Has seen GI previously.  Needs f/u given persistent flares.     Past Medical History:  Diagnosis Date  . Anemia   . Cancer ([place])   . Diverticulitis   . GERD (gastroesophageal reflux disease)   . Hyperlipidemia   . Hypertension   . Osteoarthritis   . Osteoporosis    actonel  . Positive PPD    s/p INH (2006)  . Rheumatoid arthritis(714.0)    MTX transaminitis, Leflunomide (rash), enbrel, plaquinil, prednisone, remicade, Imuran (transaminitis)  . 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 ABDOMINAL HYSTERECTOMY    . CERVICAL CONE BIOPSY    . CHOLECYSTECTOMY  06/22/14  . COLONOSCOPY WITH PROPOFOL N/A 07/09/2015   Procedure: COLONOSCOPY WITH PROPOFOL;  Surgeon: Manya Silvas, MD;  Location: [facility] ENDOSCOPY;  Service: Endoscopy;  Laterality:  N/A;  . ESOPHAGOGASTRODUODENOSCOPY (EGD) WITH PROPOFOL N/A 07/09/2015   Procedure: ESOPHAGOGASTRODUODENOSCOPY (EGD) WITH PROPOFOL;  Surgeon: Manya Silvas, MD;  Location: [facility] ENDOSCOPY;  Service: Endoscopy;  Laterality: N/A;  . OVARY SURGERY    . TRACHEOSTOMY  1959  . TUBAL LIGATION     Family History  Problem Relation Age of Onset  . Heart disease Father        MI  . Heart disease Mother   . Valvular heart disease Mother   . Breast cancer Sister 7  . Cancer Sister        Lung cancer  . Colon cancer Neg Hx    Social History   Social History  . Marital status: Widowed    Spouse name: N/A  . Number of children: 4  . Years of education: N/A   Occupational History  . retired    Social History Main Topics  . Smoking status: Never Smoker  . Smokeless tobacco: Never Used  . Alcohol use No  . Drug use: No  . Sexual activity: No   Other Topics Concern  . None   Social History Narrative   Lives with family at home    Outpatient Encounter Prescriptions as of 12/13/2016  Medication Sig  . acidophilus (RISAQUAD) CAPS capsule Take 1 capsule by mouth daily.  Marland Kitchen ADVAIR DISKUS 250-50 MCG/DOSE AEPB USE 1 INHALATION EVERY 12 HOURS  . albuterol (PROAIR HFA) 108 (90 Base) MCG/ACT inhaler USE 2 INHALATIONS EVERY  6 HOURS AS NEEDED FOR WHEEZING OR SHORTNESS OF BREATH  . albuterol (PROVENTIL) (2.5 MG/3ML) 0.083% nebulizer solution Take 2.5 mg by nebulization every 6 (six) hours as needed for wheezing or shortness of breath.  Marland Kitchen alendronate (FOSAMAX) 70 MG tablet Take 70 mg by mouth once a week.   . ALPRAZolam (XANAX) 0.25 MG tablet Take 1 tablet (0.25 mg total) by mouth daily as needed for anxiety.  Marland Kitchen aspirin EC 81 MG tablet Take 81 mg by mouth daily.  . Calcium Carbonate-Vitamin D (CALCIUM 600+D) 600-400 MG-UNIT tablet Take 1 tablet by mouth daily.   . cetirizine (ZYRTEC) 10 MG tablet Take 1 tablet (10 mg total) by mouth daily.  . cholecalciferol (VITAMIN D) 1000 units tablet Take  1,000 Units by mouth daily.  . colestipol (COLESTID) 1 g tablet Take 2 g by mouth daily.   . Diphenhyd-Hydrocort-Nystatin (FIRST-DUKES MOUTHWASH) SUSP Pt is to swish and spit 5 cc's tid  . esomeprazole (NEXIUM) 20 MG capsule Take 20 mg by mouth daily at 12 noon.  Marland Kitchen FeFum-FePo-FA-B Cmp-C-Zn-Mn-Cu (TANDEM PLUS) 162-115.2-1 MG CAPS TAKE 1 CAPSULE DAILY  . fluticasone (FLONASE) 50 MCG/ACT nasal spray Place 2 sprays into both nostrils daily. (Patient taking differently: Place 2 sprays into both nostrils 2 (two) times daily. )  . furosemide (LASIX) 20 MG tablet Take 1 tablet (20 mg total) by mouth daily.  Marland Kitchen gabapentin (NEURONTIN) 300 MG capsule Take 600 mg by mouth 2 (two) times daily.   Marland Kitchen guaiFENesin-dextromethorphan (ROBITUSSIN DM) 100-10 MG/5ML syrup Take 5 mLs by mouth every 4 (four) hours as needed for cough.  . hydroxychloroquine (PLAQUENIL) 200 MG tablet Take 1 tablet (200 mg total) by mouth daily.  Marland Kitchen KLOR-CON SPRINKLE 10 MEQ CR capsule TAKE 2 CAPSULES TWICE A DAY (Patient taking differently: TAKE 1 CAPSULE TWICE A DAY)  . lip balm (BLISTEX) OINT Apply 1 application topically as needed for lip care.  . meclizine (ANTIVERT) 25 MG tablet Take 1 tablet (25 mg total) by mouth 3 (three) times daily as needed for dizziness.  . metoprolol (LOPRESSOR) 50 MG tablet Take 1 tablet (50 mg total) by mouth 2 (two) times daily. (Patient taking differently: Take 50 mg by mouth daily. )  . Multiple Vitamin (MULTIVITAMIN WITH MINERALS) TABS tablet Take 1 tablet by mouth daily.  Marland Kitchen nystatin (MYCOSTATIN) 100000 UNIT/ML suspension Take 5 mLs (500,000 Units total) by mouth 4 (four) times daily.  Marland Kitchen nystatin cream (MYCOSTATIN) APPLY 1 APPLICATION TOPICALLY TO THE AFFECTED AREA (OUTER VAGINA) TWICE A DAY  . ondansetron (ZOFRAN ODT) 4 MG disintegrating tablet Take 1 tablet (4 mg total) by mouth every 8 (eight) hours as needed for nausea or vomiting.  . pramipexole (MIRAPEX) 0.25 MG tablet Take 2 tablets (0.5 mg total) by  mouth 2 (two) times daily.  . predniSONE (DELTASONE) 5 MG tablet Take 5 tabs po day 1; take 4 tabs po day2; 3 tabs po day3; take 2 tabs po day2; take one tab daily afterwards (Patient taking differently: Take 5 mg by mouth daily with breakfast. )  . simvastatin (ZOCOR) 20 MG tablet TAKE 1 TABLET DAILY  . sodium chloride (OCEAN) 0.65 % nasal spray Place 1 spray into the nose as needed for congestion.  Marland Kitchen SPIRIVA HANDIHALER 18 MCG inhalation capsule INHALE THE CONTENTS OF 1 CAPSULE DAILY  . traMADol (ULTRAM) 50 MG tablet Take 50 mg by mouth 2 (two) times daily as needed for moderate pain.  . [DISCONTINUED] RABEprazole (ACIPHEX) 20 MG tablet Take 1 tablet (  20 mg total) by mouth 2 (two) times daily before a meal.  . [DISCONTINUED] meloxicam (MOBIC) 15 MG tablet Take 15 mg by mouth daily.    No facility-administered encounter medications on file as of 12/13/2016.     Review of Systems  Constitutional: Negative for appetite change and unexpected weight change.  HENT: Negative for congestion and sinus pressure.   Respiratory: Negative for cough, chest tightness and shortness of breath.   Cardiovascular: Negative for chest pain, palpitations and leg swelling.  Gastrointestinal: Positive for abdominal pain, diarrhea, nausea and vomiting.  Genitourinary: Negative for difficulty urinating and dysuria.  Musculoskeletal: Negative for myalgias.       No current joint swelling.   Skin: Negative for color change and rash.  Neurological: Positive for dizziness and light-headedness. Negative for headaches.  Psychiatric/Behavioral: Negative for agitation and dysphoric mood.       Objective:     Blood pressure rechecked by me:  136/78 standing and 134/78-80 lying.    Physical Exam  Constitutional: She appears well-developed and well-nourished. No distress.  HENT:  Nose: Nose normal.  Mouth/Throat: Oropharynx is clear and moist.  Neck: Neck supple. No thyromegaly present.  Cardiovascular: Normal rate  and regular rhythm.   Pulmonary/Chest: Breath sounds normal. No respiratory distress. She has no wheezes.  Abdominal: Soft. Bowel sounds are normal. There is no tenderness.  Musculoskeletal: She exhibits no edema or tenderness.  Lymphadenopathy:    She has no cervical adenopathy.  Skin: No rash noted. No erythema.  Psychiatric: She has a normal mood and affect. Her behavior is normal.    BP (!) 124/50 (BP Location: Left Arm, Patient Position: Sitting, Cuff Size: Normal)   Pulse 62   Temp 97.8 F (36.6 C) (Oral)   Resp 12   Ht 5\' 4"  (1.626 m)   Wt 158 lb 9.6 oz (71.9 kg)   SpO2 96%   BMI 27.22 kg/m  Wt Readings from Last 3 Encounters:  12/13/16 158 lb 9.6 oz (71.9 kg)  11/19/16 157 lb (71.2 kg)  11/09/16 157 lb 8 oz (71.4 kg)     Lab Results  Component Value Date   WBC 7.9 11/19/2016   HGB 12.1 11/19/2016   HCT 34.0 (L) 11/19/2016   PLT 272 11/19/2016   GLUCOSE 116 (H) 12/13/2016   CHOL 115 06/28/2016   TRIG 72.0 06/28/2016   HDL 60.90 06/28/2016   LDLDIRECT 73.7 08/20/2013   LDLCALC 40 06/28/2016   ALT 20 11/19/2016   AST 40 11/19/2016   NA 137 12/13/2016   K 3.8 12/13/2016   CL 98 12/13/2016   CREATININE 0.95 12/13/2016   BUN 15 12/13/2016   CO2 31 12/13/2016   TSH 1.23 09/29/2016   INR 1.28 10/31/2016       Assessment & Plan:   Problem List Items Addressed This Visit    A-fib ([place])    In SR.  On toprol XL and taking one per day.  States did not tolerate the plain lopressor.  Not orthostatic on exam.  No low heart rate.  No dizziness or light headedness with prolonged standing, etc.  Hold on changing toprol.        Anemia    Follow cbc.       Diarrhea    Persistent intermittent flares as outlined.  Also associated with pain and emesis.  Was previously doing some better on colestipol.  Given persistent flares, I recommend f/u with GI.  She is in agreement.  Relevant Orders   Ambulatory referral to Gastroenterology   Dizziness    Persistent  intermittent dizziness/light headedness.  Not orthostatic on exam.  Occasional light headedness with standing quick, but quickly resolves.  No dizziness with prolonged standing.  Blood pressure in the 130s.  Heart rate controlled.  Has known afib.  On daily toprol now.  Not taking bid.  Will hold on changing toprol at this time.  Follow pressures.  We discussed dizziness is probably multifactorial.  Has had relatively recent MRI as outlined. Saw neurology.  Planning for NCS.  Also planning for vestibular rehab.  Just saw cardiology as well.  Felt stable.  Did discuss her medications.  Try to avoid tramadol.  She rarely takes.  Limit xanax to one daily.  Tapering off mirapex.  Keep f/u with neurology.       GERD (gastroesophageal reflux disease)    On nexium.  Follow.        Hypercholesterolemia    On simvastatin.  Low cholesterol diet and exercise.  Follow lipid panel and liver function tests.        Hypertension    Not orthostatic on exam.  Blood pressure as outlined.  Continue same medication regimen.  Follow pressures.        Restless leg syndrome    Tapering off mirapex to see if helps.        Rheumatoid arthritis ([place])    Followed by Dr [name].         Other Visit Diagnoses    Hyponatremia    -  Primary   Relevant Orders   Basic metabolic panel (Completed)       [name], MD

## 2016-12-13 NOTE — Progress Notes (Signed)
Pre-visit discussion using our clinic review tool. No additional management support is needed unless otherwise documented below in the visit note.  

## 2016-12-14 ENCOUNTER — Encounter: Payer: Self-pay | Admitting: Internal Medicine

## 2016-12-14 NOTE — Assessment & Plan Note (Signed)
In SR.  On toprol XL and taking one per day.  States did not tolerate the plain lopressor.  Not orthostatic on exam.  No low heart rate.  No dizziness or light headedness with prolonged standing, etc.  Hold on changing toprol.

## 2016-12-14 NOTE — Assessment & Plan Note (Addendum)
Persistent intermittent dizziness/light headedness.  Not orthostatic on exam.  Occasional light headedness with standing quick, but quickly resolves.  No dizziness with prolonged standing.  Blood pressure in the 130s.  Heart rate controlled.  Has known afib.  On daily toprol now.  Not taking bid.  Will hold on changing toprol at this time.  Follow pressures.  We discussed dizziness is probably multifactorial.  Has had relatively recent MRI as outlined. Saw neurology.  Planning for NCS.  Also planning for vestibular rehab.  Just saw cardiology as well.  Felt stable.  Did discuss her medications.  Try to avoid tramadol.  She rarely takes.  Limit xanax to one daily.  Tapering off mirapex.  Keep f/u with neurology.

## 2016-12-14 NOTE — Assessment & Plan Note (Signed)
Tapering off mirapex to see if helps.

## 2016-12-14 NOTE — Assessment & Plan Note (Signed)
Not orthostatic on exam.  Blood pressure as outlined.  Continue same medication regimen.  Follow pressures.

## 2016-12-14 NOTE — Assessment & Plan Note (Signed)
Follow cbc.  

## 2016-12-14 NOTE — Assessment & Plan Note (Signed)
On simvastatin.  Low cholesterol diet and exercise.  Follow lipid panel and liver function tests.   

## 2016-12-14 NOTE — Assessment & Plan Note (Signed)
Followed by Dr Kernodle.   

## 2016-12-14 NOTE — Assessment & Plan Note (Signed)
Persistent intermittent flares as outlined.  Also associated with pain and emesis.  Was previously doing some better on colestipol.  Given persistent flares, I recommend f/u with GI.  She is in agreement.

## 2016-12-14 NOTE — Assessment & Plan Note (Signed)
On nexium.  Follow.

## 2016-12-15 DIAGNOSIS — R42 Dizziness and giddiness: Secondary | ICD-10-CM | POA: Diagnosis not present

## 2016-12-16 ENCOUNTER — Other Ambulatory Visit: Payer: Self-pay | Admitting: Internal Medicine

## 2016-12-21 DIAGNOSIS — R42 Dizziness and giddiness: Secondary | ICD-10-CM | POA: Diagnosis not present

## 2016-12-22 ENCOUNTER — Emergency Department: Payer: Medicare Other

## 2016-12-22 ENCOUNTER — Inpatient Hospital Stay
Admission: EM | Admit: 2016-12-22 | Discharge: 2016-12-23 | DRG: 195 | Disposition: A | Discharge status: Home / Self Care | Payer: Medicare Other | Attending: Internal Medicine | Admitting: Internal Medicine

## 2016-12-22 ENCOUNTER — Telehealth: Payer: Self-pay

## 2016-12-22 ENCOUNTER — Other Ambulatory Visit: Payer: Self-pay

## 2016-12-22 DIAGNOSIS — R112 Nausea with vomiting, unspecified: Secondary | ICD-10-CM | POA: Diagnosis not present

## 2016-12-22 DIAGNOSIS — J189 Pneumonia, unspecified organism: Secondary | ICD-10-CM | POA: Diagnosis not present

## 2016-12-22 DIAGNOSIS — M069 Rheumatoid arthritis, unspecified: Secondary | ICD-10-CM | POA: Diagnosis present

## 2016-12-22 DIAGNOSIS — F419 Anxiety disorder, unspecified: Secondary | ICD-10-CM | POA: Diagnosis not present

## 2016-12-22 DIAGNOSIS — Z885 Allergy status to narcotic agent status: Secondary | ICD-10-CM | POA: Diagnosis not present

## 2016-12-22 DIAGNOSIS — Z90710 Acquired absence of both cervix and uterus: Secondary | ICD-10-CM | POA: Diagnosis not present

## 2016-12-22 DIAGNOSIS — K219 Gastro-esophageal reflux disease without esophagitis: Secondary | ICD-10-CM | POA: Diagnosis present

## 2016-12-22 DIAGNOSIS — E785 Hyperlipidemia, unspecified: Secondary | ICD-10-CM | POA: Diagnosis present

## 2016-12-22 DIAGNOSIS — K529 Noninfective gastroenteritis and colitis, unspecified: Secondary | ICD-10-CM | POA: Diagnosis not present

## 2016-12-22 DIAGNOSIS — J9601 Acute respiratory failure with hypoxia: Secondary | ICD-10-CM | POA: Diagnosis not present

## 2016-12-22 DIAGNOSIS — J209 Acute bronchitis, unspecified: Secondary | ICD-10-CM

## 2016-12-22 DIAGNOSIS — Z7982 Long term (current) use of aspirin: Secondary | ICD-10-CM

## 2016-12-22 DIAGNOSIS — Z888 Allergy status to other drugs, medicaments and biological substances status: Secondary | ICD-10-CM

## 2016-12-22 DIAGNOSIS — Z882 Allergy status to sulfonamides status: Secondary | ICD-10-CM

## 2016-12-22 DIAGNOSIS — M1990 Unspecified osteoarthritis, unspecified site: Secondary | ICD-10-CM | POA: Diagnosis present

## 2016-12-22 DIAGNOSIS — I081 Rheumatic disorders of both mitral and tricuspid valves: Secondary | ICD-10-CM | POA: Diagnosis present

## 2016-12-22 DIAGNOSIS — Z7952 Long term (current) use of systemic steroids: Secondary | ICD-10-CM

## 2016-12-22 DIAGNOSIS — Z79899 Other long term (current) drug therapy: Secondary | ICD-10-CM | POA: Diagnosis not present

## 2016-12-22 DIAGNOSIS — R509 Fever, unspecified: Secondary | ICD-10-CM | POA: Diagnosis not present

## 2016-12-22 DIAGNOSIS — R05 Cough: Secondary | ICD-10-CM | POA: Diagnosis not present

## 2016-12-22 DIAGNOSIS — I959 Hypotension, unspecified: Secondary | ICD-10-CM | POA: Diagnosis not present

## 2016-12-22 DIAGNOSIS — R531 Weakness: Secondary | ICD-10-CM | POA: Diagnosis not present

## 2016-12-22 DIAGNOSIS — Z881 Allergy status to other antibiotic agents status: Secondary | ICD-10-CM | POA: Diagnosis not present

## 2016-12-22 DIAGNOSIS — J181 Lobar pneumonia, unspecified organism: Secondary | ICD-10-CM

## 2016-12-22 DIAGNOSIS — I10 Essential (primary) hypertension: Secondary | ICD-10-CM | POA: Diagnosis present

## 2016-12-22 DIAGNOSIS — M810 Age-related osteoporosis without current pathological fracture: Secondary | ICD-10-CM | POA: Diagnosis present

## 2016-12-22 DIAGNOSIS — S90122A Contusion of left lesser toe(s) without damage to nail, initial encounter: Secondary | ICD-10-CM | POA: Diagnosis not present

## 2016-12-22 DIAGNOSIS — K760 Fatty (change of) liver, not elsewhere classified: Secondary | ICD-10-CM | POA: Diagnosis not present

## 2016-12-22 LAB — CBC WITH DIFFERENTIAL/PLATELET
BASOS PCT: 1 %
Basophils Absolute: 0.1 10*3/uL (ref 0–0.1)
EOS ABS: 0 10*3/uL (ref 0–0.7)
EOS PCT: 0 %
HCT: 30.5 % — ABNORMAL LOW (ref 35.0–47.0)
HEMOGLOBIN: 10.4 g/dL — AB (ref 12.0–16.0)
Lymphocytes Relative: 8 %
Lymphs Abs: 1.3 10*3/uL (ref 1.0–3.6)
MCH: 30.7 pg (ref 26.0–34.0)
MCHC: 34.1 g/dL (ref 32.0–36.0)
MCV: 89.9 fL (ref 80.0–100.0)
Monocytes Absolute: 1 10*3/uL — ABNORMAL HIGH (ref 0.2–0.9)
Monocytes Relative: 5 %
NEUTROS PCT: 86 %
Neutro Abs: 15.1 10*3/uL — ABNORMAL HIGH (ref 1.4–6.5)
PLATELETS: 208 10*3/uL (ref 150–440)
RBC: 3.39 MIL/uL — ABNORMAL LOW (ref 3.80–5.20)
RDW: 13.6 % (ref 11.5–14.5)
WBC: 17.5 10*3/uL — ABNORMAL HIGH (ref 3.6–11.0)

## 2016-12-22 LAB — PROCALCITONIN: Procalcitonin: 1.42 ng/mL

## 2016-12-22 LAB — URINALYSIS, COMPLETE (UACMP) WITH MICROSCOPIC
Bacteria, UA: NONE SEEN
Bilirubin Urine: NEGATIVE
GLUCOSE, UA: NEGATIVE mg/dL
HGB URINE DIPSTICK: NEGATIVE
KETONES UR: NEGATIVE mg/dL
Leukocytes, UA: NEGATIVE
NITRITE: NEGATIVE
PH: 5 (ref 5.0–8.0)
Protein, ur: NEGATIVE mg/dL
RBC / HPF: NONE SEEN RBC/hpf (ref 0–5)
Specific Gravity, Urine: 1.005 (ref 1.005–1.030)
WBC UA: NONE SEEN WBC/hpf (ref 0–5)

## 2016-12-22 LAB — LACTIC ACID, PLASMA: LACTIC ACID, VENOUS: 1.4 mmol/L (ref 0.5–1.9)

## 2016-12-22 LAB — COMPREHENSIVE METABOLIC PANEL
ALK PHOS: 38 U/L (ref 38–126)
ALT: 21 U/L (ref 14–54)
AST: 34 U/L (ref 15–41)
Albumin: 3.3 g/dL — ABNORMAL LOW (ref 3.5–5.0)
Anion gap: 6 (ref 5–15)
BILIRUBIN TOTAL: 0.7 mg/dL (ref 0.3–1.2)
BUN: 12 mg/dL (ref 6–20)
CALCIUM: 8.5 mg/dL — AB (ref 8.9–10.3)
CO2: 25 mmol/L (ref 22–32)
CREATININE: 0.98 mg/dL (ref 0.44–1.00)
Chloride: 103 mmol/L (ref 101–111)
GFR calc Af Amer: >60 mL/min (ref >60)
GFR, EST NON AFRICAN AMERICAN: 56 mL/min — AB (ref >60)
Glucose, Bld: 128 mg/dL — ABNORMAL HIGH (ref 65–99)
Potassium: 4 mmol/L (ref 3.5–5.1)
Sodium: 134 mmol/L — ABNORMAL LOW (ref 135–145)
TOTAL PROTEIN: 6.4 g/dL — AB (ref 6.5–8.1)

## 2016-12-22 MED ORDER — RISAQUAD PO CAPS: 1.0000 | ORAL_CAPSULE | Freq: Every day | ORAL | Status: DC

## 2016-12-22 MED ORDER — HYDROXYCHLOROQUINE SULFATE 200 MG PO TABS
200.0000 mg | ORAL_TABLET | Freq: Two times a day (BID) | ORAL | Status: DC
  Administered 2016-12-22 – 2016-12-23 (×2): 200 mg via ORAL
  Filled 2016-12-22 (×3): qty 1

## 2016-12-22 MED ORDER — MECLIZINE HCL 25 MG PO TABS
25.0000 mg | ORAL_TABLET | Freq: Three times a day (TID) | ORAL | Status: DC | PRN
Start: 2016-12-22 — End: 2016-12-23

## 2016-12-22 MED ORDER — FIRST-DUKES MOUTHWASH MT SUSP: OROMUCOSAL | 0 refills | Status: DC

## 2016-12-22 MED ORDER — VANCOMYCIN HCL IN DEXTROSE 1-5 GM/200ML-% IV SOLN
1000.0000 mg | Freq: Once | INTRAVENOUS | Status: DC
  Filled 2016-12-22: qty 200

## 2016-12-22 MED ORDER — ONDANSETRON HCL 4 MG PO TABS: 4.0000 mg | ORAL_TABLET | Freq: Four times a day (QID) | ORAL | Status: DC | PRN

## 2016-12-22 MED ORDER — GUAIFENESIN-DM 100-10 MG/5ML PO SYRP
5.0000 mL | ORAL_SOLUTION | ORAL | Status: DC | PRN
  Administered 2016-12-23: 5 mL via ORAL
  Filled 2016-12-22: qty 5

## 2016-12-22 MED ORDER — SIMVASTATIN 20 MG PO TABS
20.0000 mg | ORAL_TABLET | Freq: Every day | ORAL | Status: DC
  Administered 2016-12-22: 20 mg via ORAL
  Filled 2016-12-22: qty 1

## 2016-12-22 MED ORDER — POTASSIUM CHLORIDE CRYS ER 10 MEQ PO TBCR
10.0000 meq | EXTENDED_RELEASE_TABLET | Freq: Two times a day (BID) | ORAL | Status: DC
  Administered 2016-12-22 – 2016-12-23 (×2): 10 meq via ORAL
  Filled 2016-12-22 (×2): qty 1

## 2016-12-22 MED ORDER — GABAPENTIN 300 MG PO CAPS
300.0000 mg | ORAL_CAPSULE | Freq: Four times a day (QID) | ORAL | Status: DC
  Administered 2016-12-22 – 2016-12-23 (×2): 300 mg via ORAL
  Filled 2016-12-22 (×3): qty 1

## 2016-12-22 MED ORDER — ONDANSETRON HCL 4 MG/2ML IJ SOLN
4.0000 mg | Freq: Four times a day (QID) | INTRAMUSCULAR | Status: DC | PRN
  Administered 2016-12-22 – 2016-12-23 (×2): 4 mg via INTRAVENOUS
  Filled 2016-12-22 (×2): qty 2

## 2016-12-22 MED ORDER — ALPRAZOLAM 0.25 MG PO TABS
0.2500 mg | ORAL_TABLET | Freq: Every day | ORAL | Status: DC | PRN
  Administered 2016-12-22: 0.25 mg via ORAL
  Filled 2016-12-22: qty 1

## 2016-12-22 MED ORDER — COLESTIPOL HCL 1 G PO TABS
1.0000 g | ORAL_TABLET | Freq: Every day | ORAL | Status: DC
  Administered 2016-12-22 – 2016-12-23 (×2): 1 g via ORAL
  Filled 2016-12-22 (×2): qty 1

## 2016-12-22 MED ORDER — LORATADINE 10 MG PO TABS
10.0000 mg | ORAL_TABLET | Freq: Every day | ORAL | Status: DC
  Administered 2016-12-23: 10 mg via ORAL
  Filled 2016-12-22: qty 1

## 2016-12-22 MED ORDER — CEFEPIME-DEXTROSE 1 GM/50ML IV SOLR
1.0000 g | Freq: Once | INTRAVENOUS | Status: AC
  Administered 2016-12-22: 1 g via INTRAVENOUS
  Filled 2016-12-22: qty 50

## 2016-12-22 MED ORDER — ASPIRIN EC 81 MG PO TBEC
81.0000 mg | DELAYED_RELEASE_TABLET | Freq: Every day | ORAL | Status: DC
  Administered 2016-12-23: 81 mg via ORAL
  Filled 2016-12-22: qty 1

## 2016-12-22 MED ORDER — TRAMADOL HCL 50 MG PO TABS
50.0000 mg | ORAL_TABLET | Freq: Two times a day (BID) | ORAL | Status: DC | PRN
  Administered 2016-12-23: 50 mg via ORAL
  Filled 2016-12-22: qty 1

## 2016-12-22 MED ORDER — PRAMIPEXOLE DIHYDROCHLORIDE 0.25 MG PO TABS
0.5000 mg | ORAL_TABLET | Freq: Two times a day (BID) | ORAL | Status: DC
  Administered 2016-12-22 – 2016-12-23 (×2): 0.5 mg via ORAL
  Filled 2016-12-22 (×2): qty 2

## 2016-12-22 MED ORDER — DEXTROSE 5 % IV SOLN
500.0000 mg | INTRAVENOUS | Status: DC
  Filled 2016-12-22: qty 500

## 2016-12-22 MED ORDER — ACETAMINOPHEN 650 MG RE SUPP: 650.0000 mg | Freq: Four times a day (QID) | RECTAL | Status: DC | PRN

## 2016-12-22 MED ORDER — SODIUM CHLORIDE 0.9 % IV BOLUS (SEPSIS)
1000.0000 mL | Freq: Once | INTRAVENOUS | Status: AC
  Administered 2016-12-22: 1000 mL via INTRAVENOUS

## 2016-12-22 MED ORDER — CEFTRIAXONE SODIUM 1 G IJ SOLR
1.0000 g | INTRAMUSCULAR | Status: DC
  Administered 2016-12-23: 1 g via INTRAVENOUS
  Filled 2016-12-22: qty 10

## 2016-12-22 MED ORDER — PANTOPRAZOLE SODIUM 40 MG PO TBEC
40.0000 mg | DELAYED_RELEASE_TABLET | Freq: Every day | ORAL | Status: DC
  Administered 2016-12-23: 40 mg via ORAL
  Filled 2016-12-22: qty 1

## 2016-12-22 MED ORDER — ENOXAPARIN SODIUM 40 MG/0.4ML ~~LOC~~ SOLN
40.0000 mg | SUBCUTANEOUS | Status: DC
  Administered 2016-12-22: 40 mg via SUBCUTANEOUS
  Filled 2016-12-22 (×2): qty 0.4

## 2016-12-22 MED ORDER — FLUTICASONE PROPIONATE 50 MCG/ACT NA SUSP
2.0000 | Freq: Two times a day (BID) | NASAL | Status: DC
  Administered 2016-12-22 – 2016-12-23 (×2): 2 via NASAL
  Filled 2016-12-22: qty 16

## 2016-12-22 MED ORDER — POLYETHYLENE GLYCOL 3350 17 G PO PACK: 17.0000 g | PACK | Freq: Every day | ORAL | Status: DC | PRN

## 2016-12-22 MED ORDER — FUROSEMIDE 20 MG PO TABS: 20.0000 mg | ORAL_TABLET | Freq: Every day | ORAL | Status: DC

## 2016-12-22 MED ORDER — MOMETASONE FURO-FORMOTEROL FUM 200-5 MCG/ACT IN AERO
2.0000 | INHALATION_SPRAY | Freq: Two times a day (BID) | RESPIRATORY_TRACT | Status: DC
  Administered 2016-12-22 – 2016-12-23 (×2): 2 via RESPIRATORY_TRACT
  Filled 2016-12-22: qty 8.8

## 2016-12-22 MED ORDER — TIOTROPIUM BROMIDE MONOHYDRATE 18 MCG IN CAPS
1.0000 | ORAL_CAPSULE | Freq: Every day | RESPIRATORY_TRACT | Status: DC
  Administered 2016-12-22 – 2016-12-23 (×2): 18 ug via RESPIRATORY_TRACT
  Filled 2016-12-22: qty 5

## 2016-12-22 MED ORDER — ALBUTEROL SULFATE (2.5 MG/3ML) 0.083% IN NEBU: 2.5000 mg | INHALATION_SOLUTION | RESPIRATORY_TRACT | Status: DC | PRN

## 2016-12-22 MED ORDER — ACETAMINOPHEN 325 MG PO TABS
650.0000 mg | ORAL_TABLET | Freq: Four times a day (QID) | ORAL | Status: DC | PRN
  Administered 2016-12-22: 650 mg via ORAL
  Filled 2016-12-22: qty 2

## 2016-12-22 NOTE — ED Provider Notes (Signed)
[facility] Provider Note   CSN: [id number] Arrival date & time: 12/22/16  1513     History   Chief Complaint Chief Complaint  Patient presents with  . Fever    HPI Kaitlyn Huff is a 76 y.o. female hx of GERD, HTN, Rheumatoid arthritis on Remicade (last injection 2 weeks ago), here presenting with dizziness, hypotension, cough, fever. Patient states that for the last 2 days she has been having chills and just feels dizzy and lightheaded. Also has some productive cough as well. About 2 days ago she coughed and urinated on herself as well. Denies any abdominal pain or vomiting. Febrile 101 at home and was given tylenol prior to arrival. Patient was admitted several months ago for pneumonia and hypotension. In triage, patient was noted to be orthostatic syndrome or pressure to the 70s.   The history is provided by the patient.    Past Medical History:  Diagnosis Date  . Anemia   . Cancer ([place])   . Diverticulitis   . GERD (gastroesophageal reflux disease)   . Hyperlipidemia   . Hypertension   . Osteoarthritis   . Osteoporosis    actonel  . Positive PPD    s/p INH (2006)  . Rheumatoid arthritis(714.0)    MTX transaminitis, Leflunomide (rash), enbrel, plaquinil, prednisone, remicade, Imuran (transaminitis)  . Valvular heart disease    moderate MR and TR    Patient Active Problem List   Diagnosis Date Noted  . Dizziness 10/01/2016  . A-fib ([place]) 07/12/2016  . Pneumonia 06/17/2016  . 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 Chronic venous insufficiency 04/25/2016  . Pain in limb 04/25/2016  . Swelling of limb 04/25/2016  . Tachycardia 03/22/2016  . Foot fracture 03/18/2016  . Cough 01/27/2016  . Hypoxia 12/20/2015  . Lower extremity edema 12/18/2015  . Diarrhea 09/15/2015  . CAP (community acquired pneumonia) 08/30/2015  . Sepsis ([place]) 08/18/2015  . Restless leg syndrome 08/07/2015  . History of colonic polyps  07/10/2015  . Health care maintenance 01/11/2015  . Weight loss 08/04/2013  . Obstructive sleep apnea 12/23/2012  . Dilated bile duct 12/23/2012  . Hypercholesterolemia 08/12/2012  . Rheumatoid arthritis ([place]) 08/12/2012  . GERD (gastroesophageal reflux disease) 08/12/2012  . Hypertension 08/12/2012  . Anemia 08/12/2012    Past Surgical History:  Procedure Laterality Date  . ABDOMINAL HYSTERECTOMY    . CERVICAL CONE BIOPSY    . CHOLECYSTECTOMY  06/22/14  . COLONOSCOPY WITH PROPOFOL N/A 07/09/2015   Procedure: COLONOSCOPY WITH PROPOFOL;  Surgeon: Manya Silvas, MD;  Location: [facility] ENDOSCOPY;  Service: Endoscopy;  Laterality: N/A;  . ESOPHAGOGASTRODUODENOSCOPY (EGD) WITH PROPOFOL N/A 07/09/2015   Procedure: ESOPHAGOGASTRODUODENOSCOPY (EGD) WITH PROPOFOL;  Surgeon: Manya Silvas, MD;  Location[facility] ENDOSCOPY;  Service: Endoscopy;  Laterality: N/A;  . OVARY SURGERY    . TRACHEOSTOMY  1959  . TUBAL LIGATION      OB History    No data available       Home Medications    Prior to Admission medications   Medication Sig Start Date End Date Taking? Authorizing Provider  acidophilus (RISAQUAD) CAPS capsule Take 1 capsule by mouth daily.    [provider]  ADVAIR DISKUS 250-50 MCG/DOSE AEPB USE 1 INHALATION EVERY 12 HOURS 07/07/16   [name], MD  albuterol [facility] HFA) 108 (90 Base) MCG/ACT inhaler USE 2 INHALATIONS EVERY 6 HOURS AS NEEDED FOR WHEEZING OR SHORTNESS OF BREATH 10/19/16   [name],  MD  albuterol (PROVENTIL) (2.5 MG/3ML) 0.083% nebulizer solution Take 2.5 mg by nebulization every 6 (six) hours as needed for wheezing or shortness of breath.    [provider]  alendronate (FOSAMAX) 70 MG tablet Take 70 mg by mouth once a week.  09/04/15   [provider]  ALPRAZolam [name]) 0.25 MG tablet Take 1 tablet (0.25 mg total) by mouth daily as needed for anxiety. 10/19/16   [name], MD  aspirin EC 81 MG tablet Take 81 mg by mouth  daily.    [provider]  Calcium Carbonate-Vitamin D (CALCIUM 600+D) 600-400 MG-UNIT tablet Take 1 tablet by mouth daily.     [provider]  cetirizine (ZYRTEC) 10 MG tablet TAKE 1 TABLET DAILY 12/16/16   [name], MD  cholecalciferol (VITAMIN D) 1000 units tablet Take 1,000 Units by mouth daily.    [provider]  colestipol (COLESTID) 1 g tablet Take 2 g by mouth daily.     [provider]  Diphenhyd-Hydrocort-Nystatin (FIRST-DUKES MOUTHWASH) SUSP Pt is to swish and spit 5 cc's tid 12/22/16   [name], MD  esomeprazole (NEXIUM) 20 MG capsule Take 20 mg by mouth daily at 12 noon.    [provider]  FeFum-FePo-FA-B Cmp-C-Zn-Mn-Cu (TANDEM PLUS) 162-115.2-1 MG CAPS TAKE 1 CAPSULE DAILY 11/03/16   [name], MD  fluticasone (FLONASE) 50 MCG/ACT nasal spray Place 2 sprays into both nostrils daily. Patient taking differently: Place 2 sprays into both nostrils 2 (two) times daily.  01/27/16   Leone Haven, MD  furosemide (LASIX) 20 MG tablet Take 1 tablet (20 mg total) by mouth daily. 05/31/16   [name], MD  gabapentin (NEURONTIN) 300 MG capsule Take 600 mg by mouth 2 (two) times daily.     [provider]  guaiFENesin-dextromethorphan (ROBITUSSIN DM) 100-10 MG/5ML syrup Take 5 mLs by mouth every 4 (four) hours as needed for cough. 08/07/15   Theodoro Grist, MD  hydroxychloroquine (PLAQUENIL) 200 MG tablet Take 1 tablet (200 mg total) by mouth daily. 06/21/16   Loletha Grayer, MD  KLOR-CON SPRINKLE 10 MEQ CR capsule TAKE 2 CAPSULES TWICE A DAY Patient taking differently: TAKE 1 CAPSULE TWICE A DAY 06/09/16   [name], MD  lip balm (BLISTEX) OINT Apply 1 application topically as needed for lip care. 06/21/16   Loletha Grayer, MD  meclizine (ANTIVERT) 25 MG tablet Take 1 tablet (25 mg total) by mouth 3 (three) times daily as needed for dizziness. 08/09/16   Nance Pear, MD  metoprolol (LOPRESSOR) 50 MG  tablet Take 1 tablet (50 mg total) by mouth 2 (two) times daily. Patient taking differently: Take 50 mg by mouth daily.  07/19/16   [name], MD  Multiple Vitamin (MULTIVITAMIN WITH MINERALS) TABS tablet Take 1 tablet by mouth daily.    [provider]  nystatin (MYCOSTATIN) 100000 UNIT/ML suspension Take 5 mLs (500,000 Units total) by mouth 4 (four) times daily. 06/21/16   Loletha Grayer, MD  nystatin cream (MYCOSTATIN) APPLY 1 APPLICATION TOPICALLY TO THE AFFECTED AREA (OUTER VAGINA) TWICE A DAY 05/30/16   [provider]  ondansetron (ZOFRAN ODT) 4 MG disintegrating tablet Take 1 tablet (4 mg total) by mouth every 8 (eight) hours as needed for nausea or vomiting. 11/19/16   Lavonia Drafts, MD  pramipexole (MIRAPEX) 0.25 MG tablet Take 2 tablets (0.5 mg total) by mouth 2 (two) times daily. 10/19/16   [name], MD  predniSONE (DELTASONE) 5 MG tablet Take 5 tabs  po day 1; take 4 tabs po day2; 3 tabs po day3; take 2 tabs po day2; take one tab daily afterwards Patient taking differently: Take 5 mg by mouth daily with breakfast.  06/21/16   Loletha Grayer, MD  simvastatin (ZOCOR) 20 MG tablet TAKE 1 TABLET DAILY 11/16/16   [name], MD  sodium chloride (OCEAN) 0.65 % nasal spray Place 1 spray into the nose as needed for congestion.    [provider]  SPIRIVA HANDIHALER 18 MCG inhalation capsule INHALE THE CONTENTS OF 1 CAPSULE DAILY 11/14/16   [name], MD  traMADol (ULTRAM) 50 MG tablet Take 50 mg by mouth 2 (two) times daily as needed for moderate pain.    [provider]    Family History Family History  Problem Relation Age of Onset  . Heart disease Father        MI  . Heart disease Mother   . Valvular heart disease Mother   . Breast cancer Sister 47  . Cancer Sister        Lung cancer  . Colon cancer Neg Hx     Social History Social History  Substance Use Topics  . Smoking status: Never Smoker  . Smokeless tobacco: Never  Used  . Alcohol use No     Allergies   Astelin [azelastine hcl]; Ciprofloxacin; Codeine; Dilaudid [hydromorphone]; Flexeril [cyclobenzaprine]; Imuran [azathioprine]; Keflex [cephalexin]; Lisinopril; Lyrica [pregabalin]; Methotrexate derivatives; Amoxicillin; Arava [leflunomide]; Clindamycin/lincomycin; Doxycycline; Lodine [etodolac]; Percocet [oxycodone-acetaminophen]; and Sulfa antibiotics   Review of Systems Review of Systems  Constitutional: Positive for fever.  Neurological: Positive for dizziness.  All other systems reviewed and are negative.    Physical Exam Updated Vital Signs BP (!) 101/45   Pulse 65   Temp 98.8 F (37.1 C) (Oral)   Resp 18   Ht 5\' 4"  (1.626 m)   Wt 71.7 kg (158 lb)   SpO2 92%   BMI 27.12 kg/m   Physical Exam  Constitutional: She is oriented to person, place, and time.  Chronically ill   HENT:  Head: Normocephalic.  Mouth/Throat: Oropharynx is clear and moist.  Eyes: EOM are normal.  Neck: Normal range of motion. Neck supple.  Cardiovascular: Normal rate, regular rhythm and normal heart sounds.   Pulmonary/Chest: Effort normal.  Crackles L base   Abdominal: Soft. Bowel sounds are normal. She exhibits no distension. There is no tenderness.  Musculoskeletal: Normal range of motion. She exhibits no edema.  Neurological: She is alert and oriented to person, place, and time. No cranial nerve deficit. Coordination normal.  Skin: Skin is warm.  L 2nd toe with a bruise, slightly tender   Psychiatric: She has a normal mood and affect.  Nursing note and vitals reviewed.    ED Treatments / Results  Labs (all labs ordered are listed, but only abnormal results are displayed) Labs Reviewed  COMPREHENSIVE METABOLIC PANEL - Abnormal; Notable for the following:       Result Value   Sodium 134 (*)    Glucose, Bld 128 (*)    Calcium 8.5 (*)    Total Protein 6.4 (*)    Albumin 3.3 (*)    GFR calc non Af Amer 56 (*)    All other components within  normal limits  CBC WITH DIFFERENTIAL/PLATELET - Abnormal; Notable for the following:    WBC 17.5 (*)    RBC 3.39 (*)    Hemoglobin 10.4 (*)    HCT 30.5 (*)    Neutro Abs 15.1 (*)  Monocytes Absolute 1.0 (*)    All other components within normal limits  CULTURE, BLOOD (ROUTINE X 2)  CULTURE, BLOOD (ROUTINE X 2)  LACTIC ACID, PLASMA  URINALYSIS, COMPLETE (UACMP) WITH MICROSCOPIC  PROCALCITONIN    EKG  EKG Interpretation None       Radiology Dg Chest 2 View  Result Date: 12/22/2016 CLINICAL DATA:  Fever, chills and weakness last night with dry cough x1 week. EXAM: CHEST  2 VIEW COMPARISON:  None. FINDINGS: The heart size and mediastinal contours are within normal limits. Superimposed on chronic bronchitic appearing change is a more focal airspace opacity in region of the lingula that may represent a small focus of pneumonia. Trace left effusion with blunting of the left lateral costophrenic angle. Tiny granuloma, vascular summation or bone island projects over the right posterior fifth rib. The visualized skeletal structures are unremarkable. IMPRESSION: 1. Chronic bronchitic change with small focus of superimposed airspace disease in the region of the lingula that may reflect pneumonia. 2. Tiny granuloma, best or bone island suspected in the right upper lobe. Electronically Signed   By: Ashley Royalty M.D.   On: 12/22/2016 15:58    Procedures Procedures (including critical care time)  Medications Ordered in ED Medications  vancomycin (VANCOCIN) IVPB 1000 mg/200 mL premix (not administered)  ceFEPIme (MAXIPIME) 1 GM / 59mL IVPB premix (not administered)  sodium chloride 0.9 % bolus 1,000 mL (1,000 mLs Intravenous New Bag/Given 12/22/16 1621)     Initial Impression / Assessment and Plan / ED Course  I have reviewed the triage vital signs and the nursing notes.  Pertinent labs & imaging results that were available during my care of the patient were reviewed by me and considered in  my medical decision making (see chart for details).    Kaitlyn Huff is a 76 y.o. female here with cough, fever, hypotension. Her BP is in the 90s when she stood up. Will do sepsis workup and give IVF and abx and likely admit.   4:52 PM WBC 17. Lactate nl. CXR showed possible LLL pneumonia. Given hypotension, meets SIRS criteria. Will admit. Given vanc/cefepime for HCAP.    Final Clinical Impressions(s) / ED Diagnoses   Final diagnoses:  None    New Prescriptions New Prescriptions   No medications on file     Drenda Freeze, MD 12/22/16 1652

## 2016-12-22 NOTE — Telephone Encounter (Signed)
Noted.  Please call and check on pt later.  Thanks

## 2016-12-22 NOTE — Telephone Encounter (Signed)
Fax received for refill on Dukes magic mouth wash   Directions: swish and spit 28ml tid prn Last given: 06/06/16 Number refills: 0  Patient was given to by Joycelyn Schmid at o/v. She has not been seen by you for this problem. Called states that she has been having trouble with thrush she had had it a few times between now and last time she was seen. She states it started last week and was getting better but she has ran out of meds.

## 2016-12-22 NOTE — Telephone Encounter (Signed)
Called patient to give this information phone was answered by daughter. States that patient had GI app today. When she got back she had to call family to get her back inside from car. She is not "lucid"  for family to get any history and she is running a fever of 101.2. Per daughter patient has history of sepsis and she is calling rescue to take her to ED to be evaluated. She will call and give [place] an update tomorrow on patient.

## 2016-12-22 NOTE — Telephone Encounter (Signed)
Ok to refill x 1 if better.  If persistent problems, need to know.

## 2016-12-22 NOTE — ED Triage Notes (Signed)
Pt bib EMS from PCP w/ c/o weakness and fever. Pt denies urinary s/s, unusual cough, n/v/d or CP

## 2016-12-22 NOTE — ED Notes (Signed)
Report given to Lauren RN.

## 2016-12-22 NOTE — H&P (Signed)
[facility] [place] NAME: Kaitlyn Huff    MR#:  [id number]  DATE OF BIRTH:  1941/07/04  DATE OF ADMISSION:  12/22/2016  PRIMARY CARE PHYSICIAN: [name], MD   REQUESTING/REFERRING PHYSICIAN: Dr. Darl Householder  CHIEF COMPLAINT:   Chief Complaint  Patient presents with  . Fever   HISTORY OF PRESENT ILLNESS:  Kaitlyn Huff  is a 76 y.o. female with a known history of Rheumatoid arthritis, hypertension presented to the emergency room with fever, weakness and feeling lightheaded. Hypotensive in the emergency room with systolic blood pressure was 70s. It has improved at this time with fluids. The patient is drowsy. More forgetful as per daughter at bedside. She is alert and oriented 3 at this time but falls to sleep very easily. She normally ambulatory on her own but has been having some difficulty over the past 2 days.  Daughter tells me that she has been more forgetful over the past few months and is wondering if she has early dementia.  PAST MEDICAL HISTORY:   Past Medical History:  Diagnosis Date  . Anemia   . Cancer ([place])   . Diverticulitis   . GERD (gastroesophageal reflux disease)   . Hyperlipidemia   . Hypertension   . Osteoarthritis   . Osteoporosis    actonel  . Positive PPD    s/p INH (2006)  . Rheumatoid arthritis(714.0)    MTX transaminitis, Leflunomide (rash), enbrel, plaquinil, prednisone, remicade, Imuran (transaminitis)  . Valvular heart disease    moderate MR and TR    PAST SURGICAL HISTORY:   Past Surgical History:  Procedure Laterality Date  . ABDOMINAL HYSTERECTOMY    . CERVICAL CONE BIOPSY    . CHOLECYSTECTOMY  06/22/14  . COLONOSCOPY WITH PROPOFOL N/A 07/09/2015   Procedure: COLONOSCOPY WITH PROPOFOL;  Surgeon: Manya Silvas, MD;  Location: [facility] ENDOSCOPY;  Service: Endoscopy;  Laterality: N/A;  . ESOPHAGOGASTRODUODENOSCOPY (EGD) WITH PROPOFOL N/A 07/09/2015   Procedure: ESOPHAGOGASTRODUODENOSCOPY (EGD)  WITH PROPOFOL;  Surgeon: Manya Silvas, MD;  Location: [facility] ENDOSCOPY;  Service: Endoscopy;  Laterality: N/A;  . OVARY SURGERY    . TRACHEOSTOMY  1959  . TUBAL LIGATION      SOCIAL HISTORY:   Social History  Substance Use Topics  . Smoking status: Never Smoker  . Smokeless tobacco: Never Used  . Alcohol use No    FAMILY HISTORY:   Family History  Problem Relation Age of Onset  . Heart disease Father        MI  . Heart disease Mother   . Valvular heart disease Mother   . Breast cancer Sister 74  . Cancer Sister        Lung cancer  . Colon cancer Neg Hx     DRUG ALLERGIES:   Allergies  Allergen Reactions  . Astelin [Azelastine Hcl] Other (See Comments)    Reaction:  Unknown   . Codeine Other (See Comments)    Reaction:  Altered mental status  . Dilaudid [Hydromorphone] Other (See Comments)    Reaction:  Unknown   . Flexeril [Cyclobenzaprine] Other (See Comments)    Reaction:  Unknown   . Imuran [Azathioprine] Other (See Comments)    Reaction:  Abnormal liver function  . Lisinopril Itching  . Lyrica [Pregabalin] Other (See Comments)    Reaction:  Sore gums   . Methotrexate Derivatives Other (See Comments)    Reaction:  Abnormal liver function  . Amoxicillin Rash and Other (See  Comments)    Unable to obtain enough information to answer additional questions about this medication.    Jolee Ewing [Leflunomide] Rash  . Clindamycin/Lincomycin Rash  . Doxycycline Rash  . Lodine [Etodolac] Rash  . Percocet [Oxycodone-Acetaminophen] Rash  . Sulfa Antibiotics Rash    REVIEW OF SYSTEMS:   Review of Systems  Constitutional: Positive for chills, fever and malaise/fatigue.  HENT: Negative for sore throat.   Eyes: Negative for blurred vision, double vision and pain.  Respiratory: Positive for cough. Negative for hemoptysis, shortness of breath and wheezing.   Cardiovascular: Negative for chest pain, palpitations, orthopnea and leg swelling.  Gastrointestinal: Negative  for abdominal pain, constipation, diarrhea, heartburn, nausea and vomiting.  Genitourinary: Negative for dysuria and hematuria.  Musculoskeletal: Positive for myalgias. Negative for back pain and joint pain.  Skin: Negative for rash.  Neurological: Positive for weakness. Negative for sensory change, speech change, focal weakness and headaches.  Endo/Heme/Allergies: Does not bruise/bleed easily.  Psychiatric/Behavioral: Negative for depression. The patient is not nervous/anxious.     MEDICATIONS AT HOME:   Prior to Admission medications   Medication Sig Start Date End Date Taking? Authorizing Provider  acidophilus (RISAQUAD) CAPS capsule Take 1 capsule by mouth daily.   Yes [provider]  ADVAIR DISKUS 250-50 MCG/DOSE AEPB USE 1 INHALATION EVERY 12 HOURS 07/07/16  Yes [name], MD  albuterol (PROAIR HFA) 108 (90 Base) MCG/ACT inhaler USE 2 INHALATIONS EVERY 6 HOURS AS NEEDED FOR WHEEZING OR SHORTNESS OF BREATH 10/19/16  Yes [name], MD  albuterol (PROVENTIL) (2.5 MG/3ML) 0.083% nebulizer solution Take 2.5 mg by nebulization every 6 (six) hours as needed for wheezing or shortness of breath.   Yes [provider]  alendronate (FOSAMAX) 70 MG tablet Take 70 mg by mouth once a week.  09/04/15  Yes [provider]  ALPRAZolam (XANAX) 0.25 MG tablet Take 1 tablet (0.25 mg total) by mouth daily as needed for anxiety. 10/19/16  Yes [name], MD  aspirin EC 81 MG tablet Take 81 mg by mouth daily.   Yes [provider]  Calcium Carbonate-Vitamin D (CALCIUM 600+D) 600-400 MG-UNIT tablet Take 1 tablet by mouth 2 (two) times daily.    Yes [provider]  cetirizine (ZYRTEC) 10 MG tablet TAKE 1 TABLET DAILY 12/16/16  Yes [name], MD  cholecalciferol (VITAMIN D) 1000 units tablet Take 1,000 Units by mouth daily.   Yes [provider]  colestipol (COLESTID) 1 g tablet Take 1 g by mouth daily.    Yes [provider]   Diphenhyd-Hydrocort-Nystatin (FIRST-DUKES MOUTHWASH) SUSP Pt is to swish and spit 5 cc's tid 12/22/16  Yes [name], MD  esomeprazole (NEXIUM) 20 MG capsule Take 20 mg by mouth daily at 12 noon.   Yes [provider]  FeFum-FePo-FA-B Cmp-C-Zn-Mn-Cu (TANDEM PLUS) 162-115.2-1 MG CAPS TAKE 1 CAPSULE DAILY 11/03/16  Yes [name], MD  fluticasone (FLONASE) 50 MCG/ACT nasal spray Place 2 sprays into both nostrils daily. Patient taking differently: Place 2 sprays into both nostrils 2 (two) times daily.  01/27/16  Yes Leone Haven, MD  furosemide (LASIX) 20 MG tablet Take 1 tablet (20 mg total) by mouth daily. Patient taking differently: Take 40 mg by mouth daily.  05/31/16  Yes [name], MD  gabapentin (NEURONTIN) 300 MG capsule Take 300 mg by mouth 4 (four) times daily.    Yes [provider]  guaiFENesin-dextromethorphan (ROBITUSSIN DM) 100-10 MG/5ML syrup Take 5 mLs by mouth every 4 (four)  hours as needed for cough. 08/07/15  Yes Theodoro Grist, MD  hydroxychloroquine (PLAQUENIL) 200 MG tablet Take 1 tablet (200 mg total) by mouth daily. Patient taking differently: Take 200 mg by mouth 2 (two) times daily.  06/21/16  Yes Wieting, Richard, MD  KLOR-CON SPRINKLE 10 MEQ CR capsule TAKE 2 CAPSULES TWICE A DAY Patient taking differently: TAKE 2 CAPSULES Once a day 06/09/16  Yes [name], MD  lip balm (BLISTEX) OINT Apply 1 application topically as needed for lip care. 06/21/16  Yes Loletha Grayer, MD  meclizine (ANTIVERT) 25 MG tablet Take 1 tablet (25 mg total) by mouth 3 (three) times daily as needed for dizziness. 08/09/16  Yes Nance Pear, MD  metoprolol succinate (TOPROL-XL) 50 MG 24 hr tablet Take 50 mg by mouth daily. Take with or immediately following a meal.   Yes [provider]  Multiple Vitamin (MULTIVITAMIN WITH MINERALS) TABS tablet Take 1 tablet by mouth daily.   Yes [provider]  nystatin cream (MYCOSTATIN) APPLY 1  APPLICATION TOPICALLY TO THE AFFECTED AREA (OUTER VAGINA) TWICE A DAY 05/30/16  Yes [provider]  ondansetron (ZOFRAN ODT) 4 MG disintegrating tablet Take 1 tablet (4 mg total) by mouth every 8 (eight) hours as needed for nausea or vomiting. 11/19/16  Yes Lavonia Drafts, MD  pramipexole (MIRAPEX) 0.25 MG tablet Take 2 tablets (0.5 mg total) by mouth 2 (two) times daily. 10/19/16  Yes [name], MD  predniSONE (DELTASONE) 5 MG tablet Take 5 tabs po day 1; take 4 tabs po day2; 3 tabs po day3; take 2 tabs po day2; take one tab daily afterwards Patient taking differently: Take 5 mg by mouth daily with breakfast.  06/21/16  Yes Wieting, Richard, MD  simvastatin (ZOCOR) 20 MG tablet TAKE 1 TABLET DAILY 11/16/16  Yes [name], MD  sodium chloride (OCEAN) 0.65 % nasal spray Place 1 spray into the nose as needed for congestion.   Yes [provider]  SPIRIVA HANDIHALER 18 MCG inhalation capsule INHALE THE CONTENTS OF 1 CAPSULE DAILY 11/14/16  Yes [name], MD  traMADol (ULTRAM) 50 MG tablet Take 50 mg by mouth 2 (two) times daily as needed for moderate pain.   Yes [provider]  metoprolol (LOPRESSOR) 50 MG tablet Take 1 tablet (50 mg total) by mouth 2 (two) times daily. Patient not taking: Reported on 12/22/2016 07/19/16   [name], MD  nystatin (MYCOSTATIN) 100000 UNIT/ML suspension Take 5 mLs (500,000 Units total) by mouth 4 (four) times daily. Patient not taking: Reported on 12/22/2016 06/21/16   Loletha Grayer, MD     VITAL SIGNS:  Blood pressure (!) 101/45, pulse 65, temperature 98.8 F (37.1 C), temperature source Oral, resp. rate 18, height 5\' 4"  (1.626 m), weight 71.7 kg (158 lb), SpO2 92 %.  PHYSICAL EXAMINATION:  Physical Exam  GENERAL:  76 y.o.-year-old patient lying in the bed with no acute distress.  EYES: Pupils equal, round, reactive to light and accommodation. No scleral icterus. Extraocular muscles intact.  HEENT: Head atraumatic,  normocephalic. Oropharynx and nasopharynx clear. No oropharyngeal erythema, moist oral mucosa  NECK:  Supple, no jugular venous distention. No thyroid enlargement, no tenderness.  LUNGS: Normal breath sounds bilaterally, no wheezing, rales, rhonchi. No use of accessory muscles of respiration.  CARDIOVASCULAR: S1, S2 normal. No murmurs, rubs, or gallops.  ABDOMEN: Soft, nontender, nondistended. Bowel sounds present. No organomegaly or mass.  EXTREMITIES: No pedal edema, cyanosis, or clubbing. + 2 pedal & radial pulses b/l.  NEUROLOGIC: Cranial nerves II through XII are intact. No focal Motor or sensory deficits appreciated b/l PSYCHIATRIC: The patient is alert and oriented x 3. Good affect.  SKIN: No obvious rash, lesion, or ulcer.   LABORATORY PANEL:   CBC  Recent Labs Lab 12/22/16 1523  WBC 17.5*  HGB 10.4*  HCT 30.5*  PLT 208   ------------------------------------------------------------------------------------------------------------------  Chemistries   Recent Labs Lab 12/22/16 1523  NA 134*  K 4.0  CL 103  CO2 25  GLUCOSE 128*  BUN 12  CREATININE 0.98  CALCIUM 8.5*  AST 34  ALT 21  ALKPHOS 38  BILITOT 0.7   ------------------------------------------------------------------------------------------------------------------  Cardiac Enzymes No results for input(s): TROPONINI in the last 168 hours. ------------------------------------------------------------------------------------------------------------------  RADIOLOGY:  Dg Chest 2 View  Result Date: 12/22/2016 CLINICAL DATA:  Fever, chills and weakness last night with dry cough x1 week. EXAM: CHEST  2 VIEW COMPARISON:  None. FINDINGS: The heart size and mediastinal contours are within normal limits. Superimposed on chronic bronchitic appearing change is a more focal airspace opacity in region of the lingula that may represent a small focus of pneumonia. Trace left effusion with blunting of the left lateral  costophrenic angle. Tiny granuloma, vascular summation or bone island projects over the right posterior fifth rib. The visualized skeletal structures are unremarkable. IMPRESSION: 1. Chronic bronchitic change with small focus of superimposed airspace disease in the region of the lingula that may reflect pneumonia. 2. Tiny granuloma, best or bone island suspected in the right upper lobe. Electronically Signed   By: Ashley Royalty M.D.   On: 12/22/2016 15:58   Dg Toe 2nd Left  Result Date: 12/22/2016 CLINICAL DATA:  Pain in the left second toe with some bruising, no acute injury EXAM: LEFT SECOND TOE COMPARISON:  Left foot films of 03/18/2016 FINDINGS: No acute fracture is seen. Thickened cortex of the mid distal left second metatarsal was present previously and may be due to prior fracture with healing. Joint spaces appear normal. No erosion is seen. IMPRESSION: 1. No acute fracture. 2. Probable old fracture of the mid distal left second metatarsal with callus formation. Electronically Signed   By: Ivar Drape M.D.   On: 12/22/2016 16:42     IMPRESSION AND PLAN:   * Left lower lobe pneumonia  Stat azithromycin and ceftriaxone IVF Nebs PRN  * Rheumatoid arthritis Continue home meds  * DVT prophylaxis Lovenox  All the records are reviewed and case discussed with ED provider. Management plans discussed with the patient, family and they are in agreement.  CODE STATUS: Partial code. No intubation  TOTAL TIME TAKING CARE OF THIS PATIENT: 40 minutes.   Hillary Bow R M.D on 12/22/2016 at 5:14 PM  Between 7am to 6pm - Pager - [phone]  After 6pm go to [email or web address] - password [email or web address] [facility]  Office  [phone]  CC: Primary care physician; [name], MD  Note: This dictation was prepared with Dragon dictation along with smaller phrase technology. Any transcriptional errors that result from this process are unintentional.

## 2016-12-22 NOTE — Progress Notes (Signed)
[place] rounding the ED met a daughter of pt who [place] worked with last year who was in ED Rm24. Pt's daughter told [place] that her mother had heart complication and was brought in by EMS. [place] met pt who was alert and talkative. Pt told [place] she started feeling unwell yesterday and got worse today that is why she came to hospital. Pt appeared to be a little anxious and requested for prayer support, which the [facility] provided.     12/22/16 1523  Clinical Encounter Type  Visited With Patient;Family  Visit Type Initial;Spiritual support;Code;Trauma  Referral From Family  Consult/Referral To Chaplain  Spiritual Encounters  Spiritual Needs Prayer;Other (Comment)  Advance Directives (For Healthcare)  Does Patient Have a Medical Advance Directive? No  Would patient like information on creating a medical advance directive? No - Patient declined

## 2016-12-23 ENCOUNTER — Telehealth: Payer: Self-pay | Admitting: *Deleted

## 2016-12-23 DIAGNOSIS — Z79899 Other long term (current) drug therapy: Secondary | ICD-10-CM | POA: Diagnosis not present

## 2016-12-23 DIAGNOSIS — I959 Hypotension, unspecified: Secondary | ICD-10-CM | POA: Diagnosis not present

## 2016-12-23 DIAGNOSIS — M069 Rheumatoid arthritis, unspecified: Secondary | ICD-10-CM | POA: Diagnosis not present

## 2016-12-23 DIAGNOSIS — I10 Essential (primary) hypertension: Secondary | ICD-10-CM | POA: Diagnosis not present

## 2016-12-23 DIAGNOSIS — E785 Hyperlipidemia, unspecified: Secondary | ICD-10-CM | POA: Diagnosis not present

## 2016-12-23 DIAGNOSIS — J189 Pneumonia, unspecified organism: Secondary | ICD-10-CM | POA: Diagnosis not present

## 2016-12-23 DIAGNOSIS — K219 Gastro-esophageal reflux disease without esophagitis: Secondary | ICD-10-CM | POA: Diagnosis not present

## 2016-12-23 LAB — CBC
HCT: 28.9 % — ABNORMAL LOW (ref 35.0–47.0)
Hemoglobin: 9.9 g/dL — ABNORMAL LOW (ref 12.0–16.0)
MCH: 31.1 pg (ref 26.0–34.0)
MCHC: 34.2 g/dL (ref 32.0–36.0)
MCV: 90.9 fL (ref 80.0–100.0)
Platelets: 176 10*3/uL (ref 150–440)
RBC: 3.17 MIL/uL — ABNORMAL LOW (ref 3.80–5.20)
RDW: 13.8 % (ref 11.5–14.5)
WBC: 14.7 10*3/uL — ABNORMAL HIGH (ref 3.6–11.0)

## 2016-12-23 LAB — BASIC METABOLIC PANEL
Anion gap: 4 — ABNORMAL LOW (ref 5–15)
BUN: 17 mg/dL (ref 6–20)
CHLORIDE: 109 mmol/L (ref 101–111)
CO2: 27 mmol/L (ref 22–32)
CREATININE: 0.83 mg/dL (ref 0.44–1.00)
Calcium: 8.4 mg/dL — ABNORMAL LOW (ref 8.9–10.3)
GFR calc Af Amer: >60 mL/min (ref >60)
GFR calc non Af Amer: >60 mL/min (ref >60)
Glucose, Bld: 98 mg/dL (ref 65–99)
Potassium: 3.8 mmol/L (ref 3.5–5.1)
SODIUM: 140 mmol/L (ref 135–145)

## 2016-12-23 MED ORDER — POTASSIUM CHLORIDE ER 10 MEQ PO CPCR: ORAL_CAPSULE | ORAL | 3 refills | Status: DC

## 2016-12-23 MED ORDER — FUROSEMIDE 20 MG PO TABS: 40.0000 mg | ORAL_TABLET | Freq: Every day | ORAL | Status: DC

## 2016-12-23 MED ORDER — LEVOFLOXACIN 750 MG PO TABS: 750.0000 mg | ORAL_TABLET | Freq: Every day | ORAL | 0 refills | Status: DC

## 2016-12-23 MED ORDER — PREDNISONE 5 MG PO TABS: 5.0000 mg | ORAL_TABLET | Freq: Every day | ORAL | Status: DC

## 2016-12-23 MED ORDER — HYDROXYCHLOROQUINE SULFATE 200 MG PO TABS: 200.0000 mg | ORAL_TABLET | Freq: Two times a day (BID) | ORAL | Status: DC

## 2016-12-23 NOTE — Care Management (Signed)
Patient admitted for PNA.  Patient to discharge today on oral antibiotics.  Patient lives at home with daughter.  PCP Scott.  PT has assessed patient and recommended home health PT.  Patients states that she does not have any home home Medical equipment.  Patient provided home health agency preference.  Patient states that she has used [facility] previously and would like to use them again.  Referral made to [facility] with [facility].  RW to be delivered prior to discharge.  RNCM signing off.

## 2016-12-23 NOTE — Telephone Encounter (Signed)
FYI

## 2016-12-23 NOTE — Telephone Encounter (Signed)
Noted. Thanks.

## 2016-12-23 NOTE — Discharge Instructions (Signed)

## 2016-12-23 NOTE — Telephone Encounter (Signed)
Pt's daughter Horris Latino called with an update stating that pt has a small pocket of pneumonia in the opposite side then she normally gets it. Pt is voimitting but pt is being discharged because she wants to be home. Please advise, thank you!  Call [facility] @ [phone].

## 2016-12-23 NOTE — Progress Notes (Signed)
Chaplain made a follow up visit with Pt. Pt was lying in the bed and had her daughter at the bedside. Pt stated that she had not slept well and wanted to have some rest. Pt's daughter talked to [facility] about her mother's health struggles, her children's financial problems, and her personal loss of her husband to death. Pt's daughter was grieving the loss and was very concerned about 2 of her sons. [place] encouraged pt and pt's daughter, and provided prayers for pt who appeared to be doing well. Pt indicated she will be discharged today.

## 2016-12-23 NOTE — Telephone Encounter (Signed)
The message states please advise.  If she is in the hospital and having problems, she needs to let them know before she is discharged.

## 2016-12-23 NOTE — Evaluation (Signed)
Physical Therapy Evaluation Patient Details Name: Kaitlyn Huff MRN: [id number] DOB: 1940/09/09 Today's Date: 12/23/2016   History of Present Illness  Pt is a 76 y.o.femalewith a known history of Rheumatoid arthritis, hypertension presented to the emergency room with fever, weakness and feeling lightheaded. Hypotensive in the emergency room with systolic blood pressure was 70s. It has improved at this time with fluids. The patient is drowsy. More forgetful as per daughter at bedside. She is alert and oriented 3 at this time but falls to sleep very easily.  She normally is ambulatory on her own but has been having some difficulty over the past 2 days. Daughter states that pt has been more forgetful over the past few months.  Assessment includes:  LLL PNA and RA.    Clinical Impression  Pt presents with mild deficits in strength, transfers, gait, balance, and activity tolerance.  Pt Mod Ind with bed mobility tasks with extra time and effort required during sup to/from sit but no physical assistance needed.  Pt preferred use of RW for amb secondary to general weakness and fatigue.   Pt able to amb 200' with RW and SBA with slow cadence and short B step length but steady without LOB but was fatigued after that distance which is different than pt's baseline.  Pt's SpO2 100%, HR 64 bpm, and BP 124/61 mmHg after amb on room air.  Pt will benefit from HHPT services upon discharge to address above deficits for decreased caregiver assistance and safe return to PLOF.       Follow Up Recommendations Home health PT    Equipment Recommendations  Rolling walker with 5" wheels    Recommendations for Other Services       Precautions / Restrictions Precautions Precautions: Fall Restrictions Weight Bearing Restrictions: No      Mobility  Bed Mobility Overal bed mobility: Modified Independent             General bed mobility comments: Pt required extra time and effort with bed mobility tasks  but no physical assistance or use of bed rails required.  Transfers Overall transfer level: Needs assistance Equipment used: Rolling walker (2 wheeled) Transfers: Sit to/from Stand Sit to Stand: Supervision         General transfer comment: Good effort and stability upon standing from EOB  Ambulation/Gait Ambulation/Gait assistance: Supervision Ambulation Distance (Feet): 200 Feet Assistive device: Rolling walker (2 wheeled) Gait Pattern/deviations: Decreased step length - left;Decreased step length - right   Gait velocity interpretation: Below normal speed for age/gender General Gait Details: Pt preferred use of RW for amb secondary to general weakness and fatigue.   Slow cadence with amb with short B step length but steady without LOB.   Stairs            Wheelchair Mobility    Modified Rankin (Stroke Patients Only)       Balance Overall balance assessment: Needs assistance Sitting-balance support: Feet supported;No upper extremity supported Sitting balance-Leahy Scale: Normal     Standing balance support: No upper extremity supported Standing balance-Leahy Scale: Good                               Pertinent Vitals/Pain Pain Assessment: 0-10 Pain Score: 4  Pain Location: back Pain Descriptors / Indicators: Aching;Sore Pain Intervention(s): Premedicated before session;Monitored during session    [place] expects to be discharged to:: Private residence Living Arrangements: Children Available Help  at Discharge: Family;Available 24 hours/day Type of Home: House Home Access: Stairs to enter Entrance Stairs-Rails: Left;Right;Can reach both Entrance Stairs-Number of Steps: 5 Home Layout: Laundry or work area in basement;Able to live on main level with bedroom/bathroom Home Equipment: None      Prior Function Level of Independence: Independent         Comments: Independent with Amb community distances and with ADLs, no  fall history. Assist from daughter with IADLs.     Hand Dominance   Dominant Hand: Right    Extremity/Trunk Assessment   Upper Extremity Assessment Upper Extremity Assessment: Overall WFL for tasks assessed    Lower Extremity Assessment Lower Extremity Assessment: Generalized weakness       Communication   Communication: No difficulties  Cognition Arousal/Alertness: Awake/alert Behavior During Therapy: WFL for tasks assessed/performed Overall Cognitive Status: Within Functional Limits for tasks assessed                                        General Comments      Exercises Total Joint Exercises Ankle Circles/Pumps: AROM;Both;10 reps Quad Sets: Strengthening;Both;10 reps Gluteal Sets: Strengthening;Both;10 reps Heel Slides: AROM;Both;10 reps Long Arc Quad: AROM;Both;10 reps Knee Flexion: AROM;Both;10 reps Marching in Standing: AROM;Both;10 reps Other Exercises Other Exercises: HEP program education with BLE APs, QS, GS, and LAQs x 10 5-6x/day Other Exercises: B seated hip flex x 10   Assessment/Plan    PT Assessment Patient needs continued PT services  PT Problem List Decreased strength;Decreased activity tolerance;Decreased balance       PT Treatment Interventions DME instruction;Gait training;Stair training;Functional mobility training;Neuromuscular re-education;Balance training;Therapeutic activities;Therapeutic exercise;Patient/family education    PT Goals (Current goals can be found in the Care Plan section)  Acute Rehab PT Goals Patient Stated Goal: To get my strength back PT Goal Formulation: With patient Time For Goal Achievement: 01/05/17 Potential to Achieve Goals: Good    Frequency Min 2X/week   Barriers to discharge        Co-evaluation               AM-PAC PT "6 Clicks" Daily Activity  Outcome Measure Difficulty turning over in bed (including adjusting bedclothes, sheets and blankets)?: None Difficulty moving from  lying on back to sitting on the side of the bed? : None Difficulty sitting down on and standing up from a chair with arms (e.g., wheelchair, bedside commode, etc,.)?: None Help needed moving to and from a bed to chair (including a wheelchair)?: A Little Help needed walking in hospital room?: A Little Help needed climbing 3-5 steps with a railing? : A Little 6 Click Score: 21    End of Session Equipment Utilized During Treatment: Gait belt Activity Tolerance: Patient tolerated treatment well Patient left: in bed;with bed alarm set;with call bell/phone within reach;with family/visitor present Nurse Communication: Mobility status PT Visit Diagnosis: Difficulty in walking, not elsewhere classified (R26.2);Muscle weakness (generalized) (M62.81)    Time: 7106-2694 PT Time Calculation (min) (ACUTE ONLY): 28 min   Charges:   PT Evaluation $PT Eval Low Complexity: 1 Procedure PT Treatments $Therapeutic Exercise: 8-22 mins   PT G Codes:        D. Royetta Asal PT, DPT 12/23/16, 11:13 AM

## 2016-12-23 NOTE — Care Management Important Message (Signed)
Important Message  Patient Details  Name: DOMINGUE COLTRAIN MRN: [id number] Date of Birth: 1941/01/20   Medicare Important Message Given:  N/A - LOS <3 / Initial given by admissions    Beverly Sessions, RN 12/23/2016, 10:31 AM

## 2016-12-23 NOTE — Progress Notes (Signed)
Pt to be discharged per Md order. IV removed. Instructions reviewed with pt and family. All questions answered. Scripts given to pt. Will discharge in wheelchair.

## 2016-12-23 NOTE — Care Management CC44 (Signed)
Condition Code 44 Documentation Completed  Patient Details  Name: PHOENYX MELKA MRN: [id number] Date of Birth: [birth year]   Condition Code 44 given:   (Notifed that patient to be code 41.  Patient had already been discharged, prior to notification ) Patient signature on Condition Code 20 notice:    Documentation of 2 MD's agreement:    Code 44 added to claim:       Beverly Sessions, RN 12/23/2016, 12:28 PM

## 2016-12-23 NOTE — Telephone Encounter (Signed)
Patient will discharge from [facility] on 06/15. She will need a HFU scheduled with Dr.Scott.

## 2016-12-23 NOTE — Discharge Summary (Signed)
[facility] NAME: Kaitlyn Huff    MR#:  [id number]  DATE OF BIRTH:  02/27/1941  DATE OF ADMISSION:  12/22/2016 ADMITTING PHYSICIAN: Hillary Bow, MD  DATE OF DISCHARGE: 12/23/2016  PRIMARY CARE PHYSICIAN: [name], MD    ADMISSION DIAGNOSIS:  HCAP (healthcare-associated pneumonia) [J18.9]  DISCHARGE DIAGNOSIS:  Active Problems:   Left lower lobe pneumonia ([place])   SECONDARY DIAGNOSIS:   Past Medical History:  Diagnosis Date  . Anemia   . Cancer ([place])   . Diverticulitis   . GERD (gastroesophageal reflux disease)   . Hyperlipidemia   . Hypertension   . Osteoarthritis   . Osteoporosis    actonel  . Positive PPD    s/p INH (2006)  . Rheumatoid arthritis(714.0)    MTX transaminitis, Leflunomide (rash), enbrel, plaquinil, prednisone, remicade, Imuran (transaminitis)  . Valvular heart disease    moderate MR and TR    HOSPITAL COURSE:  76 year old female with rheumatoid arthritis who presented with weakness and found to have pneumonia.  1. Community-acquired pneumonia: Patient is doing well today. She has no fevers overnight. Blood cultures are negative. Patient will be discharged on Levaquin.  2. Hypotension on arrival: This has improved with IV fluids  3. Rheumatoid arthritis: Patient continue outpatient medications  4. Leukocytosis due to pneumonia which is improved  DISCHARGE CONDITIONS AND DIET:   Stable for discharge on heart healthy diet  CONSULTS OBTAINED:    DRUG ALLERGIES:   Allergies  Allergen Reactions  . Astelin [Azelastine Hcl] Other (See Comments)    Reaction:  Unknown   . Codeine Other (See Comments)    Reaction:  Altered mental status  . Dilaudid [Hydromorphone] Other (See Comments)    Reaction:  Unknown   . Flexeril [Cyclobenzaprine] Other (See Comments)    Reaction:  Unknown   . Imuran [Azathioprine] Other (See Comments)    Reaction:  Abnormal liver function  . Lisinopril  Itching  . Lyrica [Pregabalin] Other (See Comments)    Reaction:  Sore gums   . Methotrexate Derivatives Other (See Comments)    Reaction:  Abnormal liver function  . Amoxicillin Rash and Other (See Comments)    Unable to obtain enough information to answer additional questions about this medication.    Jolee Ewing [Leflunomide] Rash  . Clindamycin/Lincomycin Rash  . Doxycycline Rash  . Lodine [Etodolac] Rash  . Percocet [Oxycodone-Acetaminophen] Rash  . Sulfa Antibiotics Rash    DISCHARGE MEDICATIONS:   Current Discharge Medication List    START taking these medications   Details  levofloxacin (LEVAQUIN) 750 MG tablet Take 1 tablet (750 mg total) by mouth daily. Qty: 5 tablet, Refills: 0      CONTINUE these medications which have CHANGED   Details  furosemide (LASIX) 20 MG tablet Take 2 tablets (40 mg total) by mouth daily.    hydroxychloroquine (PLAQUENIL) 200 MG tablet Take 1 tablet (200 mg total) by mouth 2 (two) times daily.    potassium chloride (KLOR-CON SPRINKLE) 10 MEQ CR capsule TAKE 2 CAPSULES Once a day Qty: 360 capsule, Refills: 3    predniSONE (DELTASONE) 5 MG tablet Take 1 tablet (5 mg total) by mouth daily with breakfast.      CONTINUE these medications which have NOT CHANGED   Details  acidophilus (RISAQUAD) CAPS capsule Take 1 capsule by mouth daily.    ADVAIR DISKUS 250-50 MCG/DOSE AEPB USE 1 INHALATION EVERY 12 HOURS Qty: 180 each, Refills: 0  albuterol (PROAIR HFA) 108 (90 Base) MCG/ACT inhaler USE 2 INHALATIONS EVERY 6 HOURS AS NEEDED FOR WHEEZING OR SHORTNESS OF BREATH Qty: 54 g, Refills: 1    albuterol (PROVENTIL) (2.5 MG/3ML) 0.083% nebulizer solution Take 2.5 mg by nebulization every 6 (six) hours as needed for wheezing or shortness of breath.    alendronate (FOSAMAX) 70 MG tablet Take 70 mg by mouth once a week.     ALPRAZolam (XANAX) 0.25 MG tablet Take 1 tablet (0.25 mg total) by mouth daily as needed for anxiety. Qty: 90 tablet, Refills:  0    aspirin EC 81 MG tablet Take 81 mg by mouth daily.    Calcium Carbonate-Vitamin D (CALCIUM 600+D) 600-400 MG-UNIT tablet Take 1 tablet by mouth 2 (two) times daily.     cetirizine (ZYRTEC) 10 MG tablet TAKE 1 TABLET DAILY Qty: 90 tablet, Refills: 1    cholecalciferol (VITAMIN D) 1000 units tablet Take 1,000 Units by mouth daily.    colestipol (COLESTID) 1 g tablet Take 1 g by mouth daily.     Diphenhyd-Hydrocort-Nystatin (FIRST-DUKES MOUTHWASH) SUSP Pt is to swish and spit 5 cc's tid Qty: 237 mL, Refills: 0   Associated Diagnoses: Acute bronchitis, unspecified organism    esomeprazole (NEXIUM) 20 MG capsule Take 20 mg by mouth daily at 12 noon.    FeFum-FePo-FA-B Cmp-C-Zn-Mn-Cu (TANDEM PLUS) 162-115.2-1 MG CAPS TAKE 1 CAPSULE DAILY Qty: 90 each, Refills: 0    fluticasone (FLONASE) 50 MCG/ACT nasal spray Place 2 sprays into both nostrils daily. Qty: 16 g, Refills: 6    gabapentin (NEURONTIN) 300 MG capsule Take 300 mg by mouth 4 (four) times daily.     guaiFENesin-dextromethorphan (ROBITUSSIN DM) 100-10 MG/5ML syrup Take 5 mLs by mouth every 4 (four) hours as needed for cough. Qty: 118 mL, Refills: 0    lip balm (BLISTEX) OINT Apply 1 application topically as needed for lip care. Qty: 1 Tube, Refills: 0    meclizine (ANTIVERT) 25 MG tablet Take 1 tablet (25 mg total) by mouth 3 (three) times daily as needed for dizziness. Qty: 15 tablet, Refills: 0    Multiple Vitamin (MULTIVITAMIN WITH MINERALS) TABS tablet Take 1 tablet by mouth daily.    nystatin cream (MYCOSTATIN) APPLY 1 APPLICATION TOPICALLY TO THE AFFECTED AREA (OUTER VAGINA) TWICE A DAY    ondansetron (ZOFRAN ODT) 4 MG disintegrating tablet Take 1 tablet (4 mg total) by mouth every 8 (eight) hours as needed for nausea or vomiting. Qty: 20 tablet, Refills: 0    pramipexole (MIRAPEX) 0.25 MG tablet Take 2 tablets (0.5 mg total) by mouth 2 (two) times daily. Qty: 360 tablet, Refills: 1    simvastatin (ZOCOR) 20  MG tablet TAKE 1 TABLET DAILY Qty: 90 tablet, Refills: 0    sodium chloride (OCEAN) 0.65 % nasal spray Place 1 spray into the nose as needed for congestion.    SPIRIVA HANDIHALER 18 MCG inhalation capsule INHALE THE CONTENTS OF 1 CAPSULE DAILY Qty: 90 capsule, Refills: 0    traMADol (ULTRAM) 50 MG tablet Take 50 mg by mouth 2 (two) times daily as needed for moderate pain.      STOP taking these medications     metoprolol succinate (TOPROL-XL) 50 MG 24 hr tablet      metoprolol (LOPRESSOR) 50 MG tablet      nystatin (MYCOSTATIN) 100000 UNIT/ML suspension           Today   CHIEF COMPLAINT:   Doing well no SOB   VITAL  SIGNS:  Blood pressure (!) 109/42, pulse (!) 59, temperature 98.6 F (37 C), resp. rate 16, height 5\' 4"  (1.626 m), weight 73 kg (161 lb), SpO2 91 %.   REVIEW OF SYSTEMS:  Review of Systems  Constitutional: Negative.  Negative for chills, fever and malaise/fatigue.  HENT: Negative.  Negative for ear discharge, ear pain, hearing loss, nosebleeds and sore throat.   Eyes: Negative.  Negative for blurred vision and pain.  Respiratory: Negative.  Negative for cough, hemoptysis, shortness of breath and wheezing.   Cardiovascular: Negative.  Negative for chest pain, palpitations and leg swelling.  Gastrointestinal: Negative.  Negative for abdominal pain, blood in stool, diarrhea, nausea and vomiting.  Genitourinary: Negative.  Negative for dysuria.  Musculoskeletal: Negative.  Negative for back pain.  Skin: Negative.   Neurological: Negative for dizziness, tremors, speech change, focal weakness, seizures and headaches.  Endo/Heme/Allergies: Negative.  Does not bruise/bleed easily.  Psychiatric/Behavioral: Negative.  Negative for depression, hallucinations and suicidal ideas.     PHYSICAL EXAMINATION:  GENERAL:  76 y.o.-year-old patient lying in the bed with no acute distress.  NECK:  Supple, no jugular venous distention. No thyroid enlargement, no  tenderness.  LUNGS: Normal breath sounds bilaterally, no wheezing, rales,rhonchi  No use of accessory muscles of respiration.  CARDIOVASCULAR: S1, S2 normal. No murmurs, rubs, or gallops.  ABDOMEN: Soft, non-tender, non-distended. Bowel sounds present. No organomegaly or mass.  EXTREMITIES: No pedal edema, cyanosis, or clubbing.  PSYCHIATRIC: The patient is alert and oriented x 3.  SKIN: No obvious rash, lesion, or ulcer.   DATA REVIEW:   CBC  Recent Labs Lab 12/23/16 0413  WBC 14.7*  HGB 9.9*  HCT 28.9*  PLT 176    Chemistries   Recent Labs Lab 12/22/16 1523 12/23/16 0413  NA 134* 140  K 4.0 3.8  CL 103 109  CO2 25 27  GLUCOSE 128* 98  BUN 12 17  CREATININE 0.98 0.83  CALCIUM 8.5* 8.4*  AST 34  --   ALT 21  --   ALKPHOS 38  --   BILITOT 0.7  --     Cardiac Enzymes No results for input(s): TROPONINI in the last 168 hours.  Microbiology Results  @[email or web address] @  RADIOLOGY:  Dg Chest 2 View  Result Date: 12/22/2016 CLINICAL DATA:  Fever, chills and weakness last night with dry cough x1 week. EXAM: CHEST  2 VIEW COMPARISON:  None. FINDINGS: The heart size and mediastinal contours are within normal limits. Superimposed on chronic bronchitic appearing change is a more focal airspace opacity in region of the lingula that may represent a small focus of pneumonia. Trace left effusion with blunting of the left lateral costophrenic angle. Tiny granuloma, vascular summation or bone island projects over the right posterior fifth rib. The visualized skeletal structures are unremarkable. IMPRESSION: 1. Chronic bronchitic change with small focus of superimposed airspace disease in the region of the lingula that may reflect pneumonia. 2. Tiny granuloma, best or bone island suspected in the right upper lobe. Electronically Signed   By: Ashley Royalty M.D.   On: 12/22/2016 15:58   Dg Toe 2nd Left  Result Date: 12/22/2016 CLINICAL DATA:  Pain in the left second toe with some bruising,  no acute injury EXAM: LEFT SECOND TOE COMPARISON:  Left foot films of 03/18/2016 FINDINGS: No acute fracture is seen. Thickened cortex of the mid distal left second metatarsal was present previously and may be due to prior fracture with healing. Joint spaces appear normal. No erosion  is seen. IMPRESSION: 1. No acute fracture. 2. Probable old fracture of the mid distal left second metatarsal with callus formation. Electronically Signed   By: Ivar Drape M.D.   On: 12/22/2016 16:42      Current Discharge Medication List    START taking these medications   Details  levofloxacin (LEVAQUIN) 750 MG tablet Take 1 tablet (750 mg total) by mouth daily. Qty: 5 tablet, Refills: 0      CONTINUE these medications which have CHANGED   Details  furosemide (LASIX) 20 MG tablet Take 2 tablets (40 mg total) by mouth daily.    hydroxychloroquine (PLAQUENIL) 200 MG tablet Take 1 tablet (200 mg total) by mouth 2 (two) times daily.    potassium chloride (KLOR-CON SPRINKLE) 10 MEQ CR capsule TAKE 2 CAPSULES Once a day Qty: 360 capsule, Refills: 3    predniSONE (DELTASONE) 5 MG tablet Take 1 tablet (5 mg total) by mouth daily with breakfast.      CONTINUE these medications which have NOT CHANGED   Details  acidophilus (RISAQUAD) CAPS capsule Take 1 capsule by mouth daily.    ADVAIR DISKUS 250-50 MCG/DOSE AEPB USE 1 INHALATION EVERY 12 HOURS Qty: 180 each, Refills: 0    albuterol (PROAIR HFA) 108 (90 Base) MCG/ACT inhaler USE 2 INHALATIONS EVERY 6 HOURS AS NEEDED FOR WHEEZING OR SHORTNESS OF BREATH Qty: 54 g, Refills: 1    albuterol (PROVENTIL) (2.5 MG/3ML) 0.083% nebulizer solution Take 2.5 mg by nebulization every 6 (six) hours as needed for wheezing or shortness of breath.    alendronate (FOSAMAX) 70 MG tablet Take 70 mg by mouth once a week.     ALPRAZolam (XANAX) 0.25 MG tablet Take 1 tablet (0.25 mg total) by mouth daily as needed for anxiety. Qty: 90 tablet, Refills: 0    aspirin EC 81 MG  tablet Take 81 mg by mouth daily.    Calcium Carbonate-Vitamin D (CALCIUM 600+D) 600-400 MG-UNIT tablet Take 1 tablet by mouth 2 (two) times daily.     cetirizine (ZYRTEC) 10 MG tablet TAKE 1 TABLET DAILY Qty: 90 tablet, Refills: 1    cholecalciferol (VITAMIN D) 1000 units tablet Take 1,000 Units by mouth daily.    colestipol (COLESTID) 1 g tablet Take 1 g by mouth daily.     Diphenhyd-Hydrocort-Nystatin (FIRST-DUKES MOUTHWASH) SUSP Pt is to swish and spit 5 cc's tid Qty: 237 mL, Refills: 0   Associated Diagnoses: Acute bronchitis, unspecified organism    esomeprazole (NEXIUM) 20 MG capsule Take 20 mg by mouth daily at 12 noon.    FeFum-FePo-FA-B Cmp-C-Zn-Mn-Cu (TANDEM PLUS) 162-115.2-1 MG CAPS TAKE 1 CAPSULE DAILY Qty: 90 each, Refills: 0    fluticasone (FLONASE) 50 MCG/ACT nasal spray Place 2 sprays into both nostrils daily. Qty: 16 g, Refills: 6    gabapentin (NEURONTIN) 300 MG capsule Take 300 mg by mouth 4 (four) times daily.     guaiFENesin-dextromethorphan (ROBITUSSIN DM) 100-10 MG/5ML syrup Take 5 mLs by mouth every 4 (four) hours as needed for cough. Qty: 118 mL, Refills: 0    lip balm (BLISTEX) OINT Apply 1 application topically as needed for lip care. Qty: 1 Tube, Refills: 0    meclizine (ANTIVERT) 25 MG tablet Take 1 tablet (25 mg total) by mouth 3 (three) times daily as needed for dizziness. Qty: 15 tablet, Refills: 0    Multiple Vitamin (MULTIVITAMIN WITH MINERALS) TABS tablet Take 1 tablet by mouth daily.    nystatin cream (MYCOSTATIN) APPLY 1 APPLICATION TOPICALLY TO  THE AFFECTED AREA (OUTER VAGINA) TWICE A DAY    ondansetron (ZOFRAN ODT) 4 MG disintegrating tablet Take 1 tablet (4 mg total) by mouth every 8 (eight) hours as needed for nausea or vomiting. Qty: 20 tablet, Refills: 0    pramipexole (MIRAPEX) 0.25 MG tablet Take 2 tablets (0.5 mg total) by mouth 2 (two) times daily. Qty: 360 tablet, Refills: 1    simvastatin (ZOCOR) 20 MG tablet TAKE 1 TABLET  DAILY Qty: 90 tablet, Refills: 0    sodium chloride (OCEAN) 0.65 % nasal spray Place 1 spray into the nose as needed for congestion.    SPIRIVA HANDIHALER 18 MCG inhalation capsule INHALE THE CONTENTS OF 1 CAPSULE DAILY Qty: 90 capsule, Refills: 0    traMADol (ULTRAM) 50 MG tablet Take 50 mg by mouth 2 (two) times daily as needed for moderate pain.      STOP taking these medications     metoprolol succinate (TOPROL-XL) 50 MG 24 hr tablet      metoprolol (LOPRESSOR) 50 MG tablet      nystatin (MYCOSTATIN) 100000 UNIT/ML suspension          Management plans discussed with the patient and she is in agreement. Stable for discharge home  Patient should follow up with pcp  CODE STATUS:     Code Status Orders        Start     Ordered   12/22/16 1726  Limited resuscitation (code)  Continuous    Question Answer Comment  In the event of cardiac or respiratory ARREST: Initiate Code Blue, Call Rapid Response Yes   In the event of cardiac or respiratory ARREST: Perform CPR Yes   In the event of cardiac or respiratory ARREST: Perform Intubation/Mechanical Ventilation No   In the event of cardiac or respiratory ARREST: Use NIPPV/BiPAp only if indicated Yes   In the event of cardiac or respiratory ARREST: Administer ACLS medications if indicated Yes   In the event of cardiac or respiratory ARREST: Perform Defibrillation or Cardioversion if indicated Yes      12/22/16 1725    Code Status History    Date Active Date Inactive Code Status Order ID Comments User Context   12/22/2016  5:10 PM 12/22/2016  5:25 PM Full Code [id number]  Hillary Bow, MD ED   10/31/2016  5:57 PM 11/02/2016  3:19 PM Full Code [id number]  Demetrios Loll, MD Inpatient   06/19/2016  1:20 PM 06/21/2016  3:29 PM DNR [id number]  Loletha Grayer, MD Inpatient   06/17/2016  2:01 AM 06/19/2016  1:20 PM Full Code [id number]  Saundra Shelling, MD ED   08/18/2015  4:28 PM 08/22/2015  4:15 PM Full Code [id number]  Hower, Aaron Mose, MD ED    08/03/2015  9:49 AM 08/07/2015  2:11 PM Full Code [id number]  Saundra Shelling, MD Inpatient    Advance Directive Documentation     Most Recent Value  Type of Advance Directive  Healthcare Power of Attorney  Pre-existing out of facility DNR order (yellow form or pink MOST form)  -  "MOST" Form in Place?  -      TOTAL TIME TAKING CARE OF THIS PATIENT: 37 minutes.    Note: This dictation was prepared with Dragon dictation along with smaller phrase technology. Any transcriptional errors that result from this process are unintentional.  Finbar Nippert M.D on 12/23/2016 at 9:24 AM  Between 7am to 6pm - Pager - [phone] After 6pm go to [email or web address] - [place]  [facility]  Office  [phone]  CC: Primary care physician; [name], MD

## 2016-12-23 NOTE — Telephone Encounter (Signed)
Patient discharging today HCAP was DX. Will schedule FU

## 2016-12-26 ENCOUNTER — Telehealth: Payer: Self-pay | Admitting: Internal Medicine

## 2016-12-26 ENCOUNTER — Other Ambulatory Visit: Payer: Self-pay | Admitting: Internal Medicine

## 2016-12-26 ENCOUNTER — Ambulatory Visit (INDEPENDENT_AMBULATORY_CARE_PROVIDER_SITE_OTHER): Payer: Medicare Other | Admitting: Vascular Surgery

## 2016-12-26 MED ORDER — NYSTATIN 100000 UNIT/ML MT SUSP: 5.0000 mL | Freq: Four times a day (QID) | OROMUCOSAL | 0 refills | Status: DC

## 2016-12-26 NOTE — Telephone Encounter (Signed)
Patient requested the discharge.

## 2016-12-26 NOTE — Telephone Encounter (Signed)
Transition Care Management Follow-up Telephone Call  How have you been since you were released from the hospital? Patient states she feels pretty good just tired no energy.   Do you understand why you were in the hospital? Yes   Do you understand the discharge instrcutions? Yes  Items Reviewed:  Medications reviewed: Yes  Allergies reviewed: yes  Dietary changes reviewed: yes, heart healthy  Referrals reviewed: yes   Functional Questionnaire:   Activities of Daily Living (ADLs):   She states they are independent in the following: Independent in [place] they require assistance with the following:No assistance needed.  Any transportation issues/concerns?: No   Any patient concerns? 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   Confirmed importance and date/time of follow-up visits scheduled: yes   Confirmed with patient if condition begins to worsen call PCP or go to the ER.  Patient was given the Call-a-Nurse line [phone]: Yes

## 2016-12-26 NOTE — Telephone Encounter (Signed)
Patient notified

## 2016-12-26 NOTE — Telephone Encounter (Signed)
Pt called and stated that she is now home and needs to set up a HFU. Please advise, thank you!

## 2016-12-26 NOTE — Telephone Encounter (Signed)
TCM completed need date and time for HFU.

## 2016-12-26 NOTE — Telephone Encounter (Signed)
Yes,  I sent nystatin swish and swallow 4 times daily

## 2016-12-26 NOTE — Telephone Encounter (Signed)
Patient in hospital  For CAP , released on Levaquin , prednisone and inhalers. Patient able to speakinfull sentences without stopping for breath, patient denied SOB, nausea , chest pain or wheezing, no coughing noted. Patient was discharge on [employer]'s Mouth wash but insurance will not cover cabn a different mouth wash be called to pharmacy?

## 2016-12-27 DIAGNOSIS — R262 Difficulty in walking, not elsewhere classified: Secondary | ICD-10-CM | POA: Diagnosis not present

## 2016-12-27 DIAGNOSIS — Z7982 Long term (current) use of aspirin: Secondary | ICD-10-CM | POA: Diagnosis not present

## 2016-12-27 DIAGNOSIS — M810 Age-related osteoporosis without current pathological fracture: Secondary | ICD-10-CM | POA: Diagnosis not present

## 2016-12-27 DIAGNOSIS — J189 Pneumonia, unspecified organism: Secondary | ICD-10-CM | POA: Diagnosis not present

## 2016-12-27 DIAGNOSIS — M069 Rheumatoid arthritis, unspecified: Secondary | ICD-10-CM | POA: Diagnosis not present

## 2016-12-27 DIAGNOSIS — I081 Rheumatic disorders of both mitral and tricuspid valves: Secondary | ICD-10-CM | POA: Diagnosis not present

## 2016-12-27 DIAGNOSIS — Z7952 Long term (current) use of systemic steroids: Secondary | ICD-10-CM | POA: Diagnosis not present

## 2016-12-27 DIAGNOSIS — M1990 Unspecified osteoarthritis, unspecified site: Secondary | ICD-10-CM | POA: Diagnosis not present

## 2016-12-27 DIAGNOSIS — Z7951 Long term (current) use of inhaled steroids: Secondary | ICD-10-CM | POA: Diagnosis not present

## 2016-12-27 DIAGNOSIS — I10 Essential (primary) hypertension: Secondary | ICD-10-CM | POA: Diagnosis not present

## 2016-12-27 LAB — CULTURE, BLOOD (ROUTINE X 2)
CULTURE: NO GROWTH
Culture: NO GROWTH
Special Requests: ADEQUATE

## 2016-12-27 NOTE — Telephone Encounter (Signed)
The only place I know to schedule would be 12:30 on Tuesday 01/03/17.  (unless someone cancels).  Thanks

## 2016-12-27 NOTE — Telephone Encounter (Signed)
Can you schedule please?

## 2016-12-27 NOTE — Telephone Encounter (Signed)
Pt notified and scheduled.

## 2016-12-30 DIAGNOSIS — I10 Essential (primary) hypertension: Secondary | ICD-10-CM | POA: Diagnosis not present

## 2016-12-30 DIAGNOSIS — J189 Pneumonia, unspecified organism: Secondary | ICD-10-CM | POA: Diagnosis not present

## 2016-12-30 DIAGNOSIS — M069 Rheumatoid arthritis, unspecified: Secondary | ICD-10-CM | POA: Diagnosis not present

## 2016-12-30 DIAGNOSIS — M810 Age-related osteoporosis without current pathological fracture: Secondary | ICD-10-CM | POA: Diagnosis not present

## 2016-12-30 DIAGNOSIS — R262 Difficulty in walking, not elsewhere classified: Secondary | ICD-10-CM | POA: Diagnosis not present

## 2016-12-30 DIAGNOSIS — I081 Rheumatic disorders of both mitral and tricuspid valves: Secondary | ICD-10-CM | POA: Diagnosis not present

## 2017-01-02 DIAGNOSIS — I10 Essential (primary) hypertension: Secondary | ICD-10-CM | POA: Diagnosis not present

## 2017-01-02 DIAGNOSIS — R262 Difficulty in walking, not elsewhere classified: Secondary | ICD-10-CM | POA: Diagnosis not present

## 2017-01-02 DIAGNOSIS — J189 Pneumonia, unspecified organism: Secondary | ICD-10-CM | POA: Diagnosis not present

## 2017-01-02 DIAGNOSIS — M069 Rheumatoid arthritis, unspecified: Secondary | ICD-10-CM | POA: Diagnosis not present

## 2017-01-02 DIAGNOSIS — M810 Age-related osteoporosis without current pathological fracture: Secondary | ICD-10-CM | POA: Diagnosis not present

## 2017-01-02 DIAGNOSIS — I081 Rheumatic disorders of both mitral and tricuspid valves: Secondary | ICD-10-CM | POA: Diagnosis not present

## 2017-01-03 ENCOUNTER — Telehealth: Payer: Self-pay | Admitting: Internal Medicine

## 2017-01-03 ENCOUNTER — Ambulatory Visit (INDEPENDENT_AMBULATORY_CARE_PROVIDER_SITE_OTHER): Payer: Medicare Other | Admitting: Internal Medicine

## 2017-01-03 ENCOUNTER — Encounter: Payer: Self-pay | Admitting: Internal Medicine

## 2017-01-03 VITALS — BP 142/62 | HR 53 | Temp 98.4°F | Ht 64.0 in | Wt 160.8 lb

## 2017-01-03 DIAGNOSIS — D649 Anemia, unspecified: Secondary | ICD-10-CM | POA: Diagnosis not present

## 2017-01-03 DIAGNOSIS — K219 Gastro-esophageal reflux disease without esophagitis: Secondary | ICD-10-CM

## 2017-01-03 DIAGNOSIS — I4891 Unspecified atrial fibrillation: Secondary | ICD-10-CM

## 2017-01-03 DIAGNOSIS — M069 Rheumatoid arthritis, unspecified: Secondary | ICD-10-CM | POA: Diagnosis not present

## 2017-01-03 DIAGNOSIS — J189 Pneumonia, unspecified organism: Secondary | ICD-10-CM

## 2017-01-03 DIAGNOSIS — I10 Essential (primary) hypertension: Secondary | ICD-10-CM | POA: Diagnosis not present

## 2017-01-03 DIAGNOSIS — J181 Lobar pneumonia, unspecified organism: Secondary | ICD-10-CM

## 2017-01-03 LAB — CBC WITH DIFFERENTIAL/PLATELET
Basophils Absolute: 0 10*3/uL (ref 0.0–0.1)
Basophils Relative: 0.5 % (ref 0.0–3.0)
EOS PCT: 1.5 % (ref 0.0–5.0)
Eosinophils Absolute: 0.1 10*3/uL (ref 0.0–0.7)
HEMATOCRIT: 33 % — AB (ref 36.0–46.0)
HEMOGLOBIN: 11.1 g/dL — AB (ref 12.0–15.0)
LYMPHS PCT: 16.4 % (ref 12.0–46.0)
Lymphs Abs: 1.5 10*3/uL (ref 0.7–4.0)
MCHC: 33.7 g/dL (ref 30.0–36.0)
MCV: 91.6 fl (ref 78.0–100.0)
MONO ABS: 0.5 10*3/uL (ref 0.1–1.0)
MONOS PCT: 5.4 % (ref 3.0–12.0)
Neutro Abs: 7.1 10*3/uL (ref 1.4–7.7)
Neutrophils Relative %: 76.2 % (ref 43.0–77.0)
Platelets: 251 10*3/uL (ref 150.0–400.0)
RBC: 3.6 Mil/uL — AB (ref 3.87–5.11)
RDW: 13.7 % (ref 11.5–15.5)
WBC: 9.3 10*3/uL (ref 4.0–10.5)

## 2017-01-03 MED ORDER — ALPRAZOLAM 0.25 MG PO TABS: 0.2500 mg | ORAL_TABLET | Freq: Every day | ORAL | 0 refills | Status: DC | PRN

## 2017-01-03 MED ORDER — NYSTATIN 100000 UNIT/ML MT SUSP: 5.0000 mL | Freq: Four times a day (QID) | OROMUCOSAL | 0 refills | Status: DC

## 2017-01-03 NOTE — Progress Notes (Signed)
Pre visit review using our clinic review tool, if applicable. No additional management support is needed unless otherwise documented below in the visit note. 

## 2017-01-03 NOTE — Telephone Encounter (Signed)
Cathy from [facility] called requesting a refill on pt's nystatin (MYCOSTATIN) 100000 UNIT/ML suspension, ALPRAZolam (XANAX) 0.25 MG tablet, and dulera inhaler. Please advise, thank you!  Pharmacy - [place], [place]

## 2017-01-03 NOTE — Telephone Encounter (Signed)
Patient has app today printed and put with [facility] to address at o/v

## 2017-01-03 NOTE — Progress Notes (Signed)
Patient ID: Kaitlyn Huff, female   DOB: [birth year], 76 y.o.   MRN: [id number]   Subjective:    Patient ID: Kaitlyn Huff, female    DOB: May 03, 1941, 76 y.o.   MRN: [id number]  HPI  Patient here for hospital follow up.  She is accompanied by her daughter.  Was admitted 12/22/16 and diagnosed with CAP.  Treated with abx.  Discharged on levaquin.  Doing better.  Breathing better.  Cough is better.  Is eating.  No chest pain.  No acid reflux.  No abdominal pain.  Bowels moving.  No urine change. Some increased stress.  Pt feels she is handling things well.  We had previously discussed her xanax usage.  She is back to using only one per day now.  Was having issues with her GI tract previously.  This has resolved.  She was previously taking some additional xanax for this.  Now that this has resolved, she does not use increased xanax.   Her daughter was questioning the need for increased xanax.  This was discussed with both of them.  I explained that if she had issues with increased stress that increasing xanax usage was not the answer.  We would need to pursue other treatment and evaluation.  Again, she felt she was doing well and did not need any additional medication.   Past Medical History:  Diagnosis Date  . Anemia   . Cancer ([place])   . Diverticulitis   . GERD (gastroesophageal reflux disease)   . Hyperlipidemia   . Hypertension   . Osteoarthritis   . Osteoporosis    actonel  . Positive PPD    s/p INH (2006)  . Rheumatoid arthritis(714.0)    MTX transaminitis, Leflunomide (rash), enbrel, plaquinil, prednisone, remicade, Imuran (transaminitis)  . 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 ABDOMINAL HYSTERECTOMY    . CERVICAL CONE BIOPSY    . CHOLECYSTECTOMY  06/22/14  . COLONOSCOPY WITH PROPOFOL N/A 07/09/2015   Procedure: COLONOSCOPY WITH PROPOFOL;  Surgeon: Manya Silvas, MD;  Location: [facility] ENDOSCOPY;  Service: Endoscopy;   Laterality: N/A;  . ESOPHAGOGASTRODUODENOSCOPY (EGD) WITH PROPOFOL N/A 07/09/2015   Procedure: ESOPHAGOGASTRODUODENOSCOPY (EGD) WITH PROPOFOL;  Surgeon: Manya Silvas, MD;  Location: [facility] ENDOSCOPY;  Service: Endoscopy;  Laterality: N/A;  . OVARY SURGERY    . TRACHEOSTOMY  1959  . TUBAL LIGATION     Family History  Problem Relation Age of Onset  . Heart disease Father        MI  . Heart disease Mother   . Valvular heart disease Mother   . Breast cancer Sister 51  . Cancer Sister        Lung cancer  . Colon cancer Neg Hx    Social History   Social History  . Marital status: Widowed    Spouse name: N/A  . Number of children: 4  . Years of education: N/A   Occupational History  . retired    Social History Main Topics  . Smoking status: Never Smoker  . Smokeless tobacco: Never Used  . Alcohol use No  . Drug use: No  . Sexual activity: No   Other Topics Concern  . None   Social History Narrative   Lives with family at home    Outpatient Encounter Prescriptions as of 01/03/2017  Medication Sig  . acidophilus (RISAQUAD) CAPS capsule Take 1 capsule by mouth daily.  Marland Kitchen  ADVAIR DISKUS 250-50 MCG/DOSE AEPB USE 1 INHALATION EVERY 12 HOURS  . albuterol (PROAIR HFA) 108 (90 Base) MCG/ACT inhaler USE 2 INHALATIONS EVERY 6 HOURS AS NEEDED FOR WHEEZING OR SHORTNESS OF BREATH  . albuterol (PROVENTIL) (2.5 MG/3ML) 0.083% nebulizer solution Take 2.5 mg by nebulization every 6 (six) hours as needed for wheezing or shortness of breath.  Marland Kitchen alendronate (FOSAMAX) 70 MG tablet Take 70 mg by mouth once a week.   . ALPRAZolam (XANAX) 0.25 MG tablet Take 1 tablet (0.25 mg total) by mouth daily as needed for anxiety.  Marland Kitchen aspirin EC 81 MG tablet Take 81 mg by mouth daily.  . Calcium Carbonate-Vitamin D (CALCIUM 600+D) 600-400 MG-UNIT tablet Take 1 tablet by mouth 2 (two) times daily.   . cetirizine (ZYRTEC) 10 MG tablet TAKE 1 TABLET DAILY  . cholecalciferol (VITAMIN D) 1000 units tablet Take  1,000 Units by mouth daily.  . colestipol (COLESTID) 1 g tablet Take 1 g by mouth daily.   . Diphenhyd-Hydrocort-Nystatin (FIRST-DUKES MOUTHWASH) SUSP Pt is to swish and spit 5 cc's tid  . esomeprazole (NEXIUM) 20 MG capsule Take 20 mg by mouth daily at 12 noon.  Marland Kitchen FeFum-FePo-FA-B Cmp-C-Zn-Mn-Cu (TANDEM PLUS) 162-115.2-1 MG CAPS TAKE 1 CAPSULE DAILY  . fluticasone (FLONASE) 50 MCG/ACT nasal spray Place 2 sprays into both nostrils daily. (Patient taking differently: Place 2 sprays into both nostrils 2 (two) times daily. )  . furosemide (LASIX) 20 MG tablet Take 2 tablets (40 mg total) by mouth daily.  Marland Kitchen gabapentin (NEURONTIN) 300 MG capsule Take 300 mg by mouth 4 (four) times daily.   Marland Kitchen guaiFENesin-dextromethorphan (ROBITUSSIN DM) 100-10 MG/5ML syrup Take 5 mLs by mouth every 4 (four) hours as needed for cough.  . hydroxychloroquine (PLAQUENIL) 200 MG tablet Take 1 tablet (200 mg total) by mouth 2 (two) times daily.  Marland Kitchen levofloxacin (LEVAQUIN) 750 MG tablet Take 1 tablet (750 mg total) by mouth daily.  Marland Kitchen lip balm (BLISTEX) OINT Apply 1 application topically as needed for lip care.  . meclizine (ANTIVERT) 25 MG tablet Take 1 tablet (25 mg total) by mouth 3 (three) times daily as needed for dizziness.  . Multiple Vitamin (MULTIVITAMIN WITH MINERALS) TABS tablet Take 1 tablet by mouth daily.  Marland Kitchen nystatin (MYCOSTATIN) 100000 UNIT/ML suspension Take 5 mLs (500,000 Units total) by mouth 4 (four) times daily.  Marland Kitchen nystatin cream (MYCOSTATIN) APPLY 1 APPLICATION TOPICALLY TO THE AFFECTED AREA (OUTER VAGINA) TWICE A DAY  . ondansetron (ZOFRAN ODT) 4 MG disintegrating tablet Take 1 tablet (4 mg total) by mouth every 8 (eight) hours as needed for nausea or vomiting.  . potassium chloride (KLOR-CON SPRINKLE) 10 MEQ CR capsule TAKE 2 CAPSULES Once a day  . pramipexole (MIRAPEX) 0.25 MG tablet Take 2 tablets (0.5 mg total) by mouth 2 (two) times daily.  . predniSONE (DELTASONE) 5 MG tablet Take 1 tablet (5 mg total)  by mouth daily with breakfast.  . simvastatin (ZOCOR) 20 MG tablet TAKE 1 TABLET DAILY  . sodium chloride (OCEAN) 0.65 % nasal spray Place 1 spray into the nose as needed for congestion.  Marland Kitchen SPIRIVA HANDIHALER 18 MCG inhalation capsule INHALE THE CONTENTS OF 1 CAPSULE DAILY  . traMADol (ULTRAM) 50 MG tablet Take 50 mg by mouth 2 (two) times daily as needed for moderate pain.  . [DISCONTINUED] ALPRAZolam (XANAX) 0.25 MG tablet Take 1 tablet (0.25 mg total) by mouth daily as needed for anxiety.  . [DISCONTINUED] nystatin (MYCOSTATIN) 100000 UNIT/ML suspension Take 5  mLs (500,000 Units total) by mouth 4 (four) times daily.   No facility-administered encounter medications on file as of 01/03/2017.     Review of Systems  Constitutional: Negative for appetite change and unexpected weight change.  HENT: Negative for congestion and sinus pressure.   Respiratory: Negative for chest tightness.        Cough and breathing improved.    Cardiovascular: Negative for chest pain, palpitations and leg swelling.  Gastrointestinal: Negative for abdominal pain, diarrhea, nausea and vomiting.  Genitourinary: Negative for difficulty urinating and dysuria.  Musculoskeletal: Negative for gait problem and myalgias.  Skin: Negative for color change and rash.  Neurological: Negative for dizziness, light-headedness and headaches.  Psychiatric/Behavioral: Negative for agitation and dysphoric mood.       Objective:    Physical Exam  Constitutional: She appears well-developed and well-nourished. No distress.  HENT:  Nose: Nose normal.  Mouth/Throat: Oropharynx is clear and moist.  Neck: Neck supple. No thyromegaly present.  Cardiovascular: Normal rate and regular rhythm.   Pulmonary/Chest: Breath sounds normal. No respiratory distress. She has no wheezes.  Abdominal: Soft. Bowel sounds are normal. There is no tenderness.  Musculoskeletal: She exhibits no edema or tenderness.  Lymphadenopathy:    She has no  cervical adenopathy.  Skin: No rash noted. No erythema.  Psychiatric: She has a normal mood and affect. Her behavior is normal.    BP (!) 142/62   Pulse (!) 53   Temp 98.4 F (36.9 C) (Oral)   Ht 5\' 4"  (1.626 m)   Wt 160 lb 12.8 oz (72.9 kg)   SpO2 97%   BMI 27.60 kg/m  Wt Readings from Last 3 Encounters:  01/03/17 160 lb 12.8 oz (72.9 kg)  12/23/16 161 lb (73 kg)  12/13/16 158 lb 9.6 oz (71.9 kg)     Lab Results  Component Value Date   WBC 9.3 01/03/2017   HGB 11.1 (L) 01/03/2017   HCT 33.0 (L) 01/03/2017   PLT 251.0 01/03/2017   GLUCOSE 98 12/23/2016   CHOL 115 06/28/2016   TRIG 72.0 06/28/2016   HDL 60.90 06/28/2016   LDLDIRECT 73.7 08/20/2013   LDLCALC 40 06/28/2016   ALT 21 12/22/2016   AST 34 12/22/2016   NA 140 12/23/2016   K 3.8 12/23/2016   CL 109 12/23/2016   CREATININE 0.83 12/23/2016   BUN 17 12/23/2016   CO2 27 12/23/2016   TSH 1.23 09/29/2016   INR 1.28 10/31/2016    Dg Chest 2 View  Result Date: 12/22/2016 CLINICAL DATA:  Fever, chills and weakness last night with dry cough x1 week. EXAM: CHEST  2 VIEW COMPARISON:  None. FINDINGS: The heart size and mediastinal contours are within normal limits. Superimposed on chronic bronchitic appearing change is a more focal airspace opacity in region of the lingula that may represent a small focus of pneumonia. Trace left effusion with blunting of the left lateral costophrenic angle. Tiny granuloma, vascular summation or bone island projects over the right posterior fifth rib. The visualized skeletal structures are unremarkable. IMPRESSION: 1. Chronic bronchitic change with small focus of superimposed airspace disease in the region of the lingula that may reflect pneumonia. 2. Tiny granuloma, best or bone island suspected in the right upper lobe. Electronically Signed   By: Ashley Royalty M.D.   On: 12/22/2016 15:58   Dg Toe 2nd Left  Result Date: 12/22/2016 CLINICAL DATA:  Pain in the left second toe with some  bruising, no acute injury EXAM: LEFT SECOND  TOE COMPARISON:  Left foot films of 03/18/2016 FINDINGS: No acute fracture is seen. Thickened cortex of the mid distal left second metatarsal was present previously and may be due to prior fracture with healing. Joint spaces appear normal. No erosion is seen. IMPRESSION: 1. No acute fracture. 2. Probable old fracture of the mid distal left second metatarsal with callus formation. Electronically Signed   By: Ivar Drape M.D.   On: 12/22/2016 16:42       Assessment & Plan:   Problem List Items Addressed This Visit    A-fib ([place])    In SR.  On metoprolol.  Only taking one per day.  Pulse rate and blood pressure have been under reasonable control.  Continue current medication regimen for now.  Follow.        Anemia - Primary    Found to have decreased hgb.  Recheck cbc today.        Relevant Orders   CBC with Differential/Platelet (Completed)   GERD (gastroesophageal reflux disease)    Controlled on current regimen.        Hypertension    Blood pressure has been under reasonable control.  Same medication regimen.  Follow pressures.  Follow metabolic panel.        Left lower lobe pneumonia [facility])    Recently admitted as outlined.  Treated with abx.  Breathing and cough better.  Will need f/u cxr in 3-4 weeks.  Pt notified.        Pneumonia    Recently admitted and diagnosed with pneumonia.  Treated with abx.  Cough and breathing better.  Overall feeling better.  Will need f/u cxr in the next 3-4 weeks.  Discussed with her today.        Rheumatoid arthritis (HCC)    Currently stable.  Followed by Dr [name].            [name], MD

## 2017-01-04 DIAGNOSIS — I10 Essential (primary) hypertension: Secondary | ICD-10-CM | POA: Diagnosis not present

## 2017-01-04 DIAGNOSIS — J189 Pneumonia, unspecified organism: Secondary | ICD-10-CM | POA: Diagnosis not present

## 2017-01-04 DIAGNOSIS — M069 Rheumatoid arthritis, unspecified: Secondary | ICD-10-CM | POA: Diagnosis not present

## 2017-01-04 DIAGNOSIS — R262 Difficulty in walking, not elsewhere classified: Secondary | ICD-10-CM | POA: Diagnosis not present

## 2017-01-04 DIAGNOSIS — M810 Age-related osteoporosis without current pathological fracture: Secondary | ICD-10-CM | POA: Diagnosis not present

## 2017-01-04 DIAGNOSIS — I081 Rheumatic disorders of both mitral and tricuspid valves: Secondary | ICD-10-CM | POA: Diagnosis not present

## 2017-01-06 ENCOUNTER — Encounter: Payer: Self-pay | Admitting: Internal Medicine

## 2017-01-06 DIAGNOSIS — J189 Pneumonia, unspecified organism: Secondary | ICD-10-CM | POA: Diagnosis not present

## 2017-01-06 DIAGNOSIS — I10 Essential (primary) hypertension: Secondary | ICD-10-CM | POA: Diagnosis not present

## 2017-01-06 DIAGNOSIS — I081 Rheumatic disorders of both mitral and tricuspid valves: Secondary | ICD-10-CM | POA: Diagnosis not present

## 2017-01-06 DIAGNOSIS — M810 Age-related osteoporosis without current pathological fracture: Secondary | ICD-10-CM | POA: Diagnosis not present

## 2017-01-06 DIAGNOSIS — M069 Rheumatoid arthritis, unspecified: Secondary | ICD-10-CM | POA: Diagnosis not present

## 2017-01-06 DIAGNOSIS — R262 Difficulty in walking, not elsewhere classified: Secondary | ICD-10-CM | POA: Diagnosis not present

## 2017-01-06 NOTE — Assessment & Plan Note (Signed)
Found to have decreased hgb.  Recheck cbc today.

## 2017-01-06 NOTE — Assessment & Plan Note (Signed)
Controlled on current regimen.   

## 2017-01-06 NOTE — Assessment & Plan Note (Signed)
Currently stable.  Followed by Dr [name].

## 2017-01-06 NOTE — Assessment & Plan Note (Signed)
Blood pressure has been under reasonable control.  Same medication regimen.  Follow pressures.  Follow metabolic panel.   

## 2017-01-06 NOTE — Assessment & Plan Note (Signed)
In SR.  On metoprolol.  Only taking one per day.  Pulse rate and blood pressure have been under reasonable control.  Continue current medication regimen for now.  Follow.

## 2017-01-06 NOTE — Assessment & Plan Note (Signed)
Recently admitted as outlined.  Treated with abx.  Breathing and cough better.  Will need f/u cxr in 3-4 weeks.  Pt notified.

## 2017-01-06 NOTE — Assessment & Plan Note (Addendum)
Recently admitted and diagnosed with pneumonia.  Treated with abx.  Cough and breathing better.  Overall feeling better.  Will need f/u cxr in the next 3-4 weeks.  Discussed with her today.

## 2017-01-09 ENCOUNTER — Other Ambulatory Visit: Payer: Self-pay | Admitting: Internal Medicine

## 2017-01-16 ENCOUNTER — Encounter: Payer: Self-pay | Admitting: Internal Medicine

## 2017-01-16 ENCOUNTER — Ambulatory Visit (INDEPENDENT_AMBULATORY_CARE_PROVIDER_SITE_OTHER): Payer: Medicare Other | Admitting: Internal Medicine

## 2017-01-16 ENCOUNTER — Ambulatory Visit (INDEPENDENT_AMBULATORY_CARE_PROVIDER_SITE_OTHER): Payer: Medicare Other

## 2017-01-16 VITALS — BP 134/68 | HR 66 | Temp 99.3°F | Resp 12 | Ht 64.0 in | Wt 163.0 lb

## 2017-01-16 DIAGNOSIS — D649 Anemia, unspecified: Secondary | ICD-10-CM | POA: Diagnosis not present

## 2017-01-16 DIAGNOSIS — R509 Fever, unspecified: Secondary | ICD-10-CM

## 2017-01-16 DIAGNOSIS — I10 Essential (primary) hypertension: Secondary | ICD-10-CM

## 2017-01-16 DIAGNOSIS — R05 Cough: Secondary | ICD-10-CM

## 2017-01-16 DIAGNOSIS — K219 Gastro-esophageal reflux disease without esophagitis: Secondary | ICD-10-CM | POA: Diagnosis not present

## 2017-01-16 DIAGNOSIS — E7800 Pure hypercholesterolemia, unspecified: Secondary | ICD-10-CM

## 2017-01-16 DIAGNOSIS — M069 Rheumatoid arthritis, unspecified: Secondary | ICD-10-CM

## 2017-01-16 DIAGNOSIS — R059 Cough, unspecified: Secondary | ICD-10-CM

## 2017-01-16 DIAGNOSIS — J189 Pneumonia, unspecified organism: Secondary | ICD-10-CM

## 2017-01-16 DIAGNOSIS — I4891 Unspecified atrial fibrillation: Secondary | ICD-10-CM

## 2017-01-16 LAB — CBC WITH DIFFERENTIAL/PLATELET
BASOS ABS: 0.1 10*3/uL (ref 0.0–0.1)
BASOS PCT: 0.3 % (ref 0.0–3.0)
EOS ABS: 0.2 10*3/uL (ref 0.0–0.7)
Eosinophils Relative: 1 % (ref 0.0–5.0)
HEMATOCRIT: 34.2 % — AB (ref 36.0–46.0)
HEMOGLOBIN: 11.4 g/dL — AB (ref 12.0–15.0)
LYMPHS PCT: 10.5 % — AB (ref 12.0–46.0)
Lymphs Abs: 1.9 10*3/uL (ref 0.7–4.0)
MCHC: 33.2 g/dL (ref 30.0–36.0)
MCV: 91.4 fl (ref 78.0–100.0)
MONO ABS: 1.3 10*3/uL — AB (ref 0.1–1.0)
Monocytes Relative: 7.5 % (ref 3.0–12.0)
Neutro Abs: 14.3 10*3/uL — ABNORMAL HIGH (ref 1.4–7.7)
Neutrophils Relative %: 80.7 % — ABNORMAL HIGH (ref 43.0–77.0)
PLATELETS: 191 10*3/uL (ref 150.0–400.0)
RBC: 3.74 Mil/uL — ABNORMAL LOW (ref 3.87–5.11)
RDW: 13.7 % (ref 11.5–15.5)

## 2017-01-16 LAB — BASIC METABOLIC PANEL
BUN: 6 mg/dL (ref 6–23)
CHLORIDE: 99 meq/L (ref 96–112)
CO2: 29 mEq/L (ref 19–32)
Calcium: 9.2 mg/dL (ref 8.4–10.5)
Creatinine, Ser: 0.97 mg/dL (ref 0.40–1.20)
GFR: 59.71 mL/min — AB (ref >60.00)
GLUCOSE: 112 mg/dL — AB (ref 70–99)
POTASSIUM: 4.5 meq/L (ref 3.5–5.1)
SODIUM: 134 meq/L — AB (ref 135–145)

## 2017-01-16 MED ORDER — ALPRAZOLAM 0.25 MG PO TABS: 0.2500 mg | ORAL_TABLET | Freq: Every day | ORAL | 0 refills | Status: DC | PRN

## 2017-01-16 MED ORDER — LEVOFLOXACIN 500 MG PO TABS: 500.0000 mg | ORAL_TABLET | Freq: Every day | ORAL | 0 refills | Status: DC

## 2017-01-16 NOTE — Progress Notes (Signed)
Pre-visit discussion using our clinic review tool. No additional management support is needed unless otherwise documented below in the visit note.  

## 2017-01-16 NOTE — Progress Notes (Signed)
Patient ID: Kaitlyn Huff, female   DOB: 01-Oct-1940, 76 y.o.   MRN: [id number]   Subjective:    Patient ID: Kaitlyn Huff, female    DOB: Mar 07, 1941, 76 y.o.   MRN: [id number]  HPI  Patient here for a scheduled follow up.  She is accompanied by her daughter.  History obtained from both of them.  She was admitted 12/22/16 and diagnosed with CAP.  Treated with abx and discharged on levaquin.  She was doing better.  Has had persistent cough.  Never completely resolved.  Over the last 24 hours, she reports noticing some increased cough, fever and shaking chills.  She is eating.  Noticed some wheezing.  Increased drainage.  Taking robitussin.  Tmax 100.9.  Some acid reflux at night, but overall GI symptoms improved.  No abdominal pain.  Bowels moving.  No diarrhea.     Past Medical History:  Diagnosis Date  . Anemia   . Cancer ([place])   . Diverticulitis   . GERD (gastroesophageal reflux disease)   . Hyperlipidemia   . Hypertension   . Osteoarthritis   . Osteoporosis    actonel  . Positive PPD    s/p INH (2006)  . Rheumatoid arthritis(714.0)    MTX transaminitis, Leflunomide (rash), enbrel, plaquinil, prednisone, remicade, Imuran (transaminitis)  . 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 ABDOMINAL HYSTERECTOMY    . CERVICAL CONE BIOPSY    . CHOLECYSTECTOMY  06/22/14  . COLONOSCOPY WITH PROPOFOL N/A 07/09/2015   Procedure: COLONOSCOPY WITH PROPOFOL;  Surgeon: Manya Silvas, MD;  Location: [facility] ENDOSCOPY;  Service: Endoscopy;  Laterality: N/A;  . ESOPHAGOGASTRODUODENOSCOPY (EGD) WITH PROPOFOL N/A 07/09/2015   Procedure: ESOPHAGOGASTRODUODENOSCOPY (EGD) WITH PROPOFOL;  Surgeon: Manya Silvas, MD;  Location: [facility] ENDOSCOPY;  Service: Endoscopy;  Laterality: N/A;  . OVARY SURGERY    . TRACHEOSTOMY  1959  . TUBAL LIGATION     Family History  Problem Relation Age of Onset  . Heart disease Father        MI  . Heart  disease Mother   . Valvular heart disease Mother   . Breast cancer Sister 64  . Cancer Sister        Lung cancer  . Colon cancer Neg Hx    Social History   Social History  . Marital status: Widowed    Spouse name: N/A  . Number of children: 4  . Years of education: N/A   Occupational History  . retired    Social History Main Topics  . Smoking status: Never Smoker  . Smokeless tobacco: Never Used  . Alcohol use No  . Drug use: No  . Sexual activity: No   Other Topics Concern  . None   Social History Narrative   Lives with family at home    Outpatient Encounter Prescriptions as of 01/16/2017  Medication Sig  . acidophilus (RISAQUAD) CAPS capsule Take 1 capsule by mouth daily.  Marland Kitchen ADVAIR DISKUS 250-50 MCG/DOSE AEPB USE 1 INHALATION EVERY 12 HOURS  . albuterol (PROAIR HFA) 108 (90 Base) MCG/ACT inhaler USE 2 INHALATIONS EVERY 6 HOURS AS NEEDED FOR WHEEZING OR SHORTNESS OF BREATH  . albuterol (PROVENTIL) (2.5 MG/3ML) 0.083% nebulizer solution Take 2.5 mg by nebulization every 6 (six) hours as needed for wheezing or shortness of breath.  Marland Kitchen alendronate (FOSAMAX) 70 MG tablet Take 70 mg by mouth once a week.   Marland Kitchen  ALPRAZolam (XANAX) 0.25 MG tablet Take 1 tablet (0.25 mg total) by mouth daily as needed for anxiety.  Marland Kitchen aspirin EC 81 MG tablet Take 81 mg by mouth daily.  . Calcium Carbonate-Vitamin D (CALCIUM 600+D) 600-400 MG-UNIT tablet Take 1 tablet by mouth 2 (two) times daily.   . cetirizine (ZYRTEC) 10 MG tablet TAKE 1 TABLET DAILY  . cholecalciferol (VITAMIN D) 1000 units tablet Take 1,000 Units by mouth daily.  . colestipol (COLESTID) 1 g tablet Take 1 g by mouth daily.   . Diphenhyd-Hydrocort-Nystatin (FIRST-DUKES MOUTHWASH) SUSP Pt is to swish and spit 5 cc's tid  . esomeprazole (NEXIUM) 20 MG capsule Take 20 mg by mouth daily at 12 noon.  Marland Kitchen FeFum-FePo-FA-B Cmp-C-Zn-Mn-Cu (TANDEM PLUS) 162-115.2-1 MG CAPS TAKE 1 CAPSULE DAILY  . fluticasone (FLONASE) 50 MCG/ACT nasal spray  Place 2 sprays into both nostrils daily. (Patient taking differently: Place 2 sprays into both nostrils 2 (two) times daily. )  . furosemide (LASIX) 20 MG tablet Take 2 tablets (40 mg total) by mouth daily.  Marland Kitchen gabapentin (NEURONTIN) 300 MG capsule Take 300 mg by mouth 4 (four) times daily.   Marland Kitchen guaiFENesin-dextromethorphan (ROBITUSSIN DM) 100-10 MG/5ML syrup Take 5 mLs by mouth every 4 (four) hours as needed for cough.  . hydroxychloroquine (PLAQUENIL) 200 MG tablet Take 1 tablet (200 mg total) by mouth 2 (two) times daily.  Marland Kitchen lip balm (BLISTEX) OINT Apply 1 application topically as needed for lip care.  . meclizine (ANTIVERT) 25 MG tablet Take 1 tablet (25 mg total) by mouth 3 (three) times daily as needed for dizziness.  . Multiple Vitamin (MULTIVITAMIN WITH MINERALS) TABS tablet Take 1 tablet by mouth daily.  Marland Kitchen nystatin (MYCOSTATIN) 100000 UNIT/ML suspension Take 5 mLs (500,000 Units total) by mouth 4 (four) times daily.  Marland Kitchen nystatin cream (MYCOSTATIN) APPLY 1 APPLICATION TOPICALLY TO THE AFFECTED AREA (OUTER VAGINA) TWICE A DAY  . ondansetron (ZOFRAN ODT) 4 MG disintegrating tablet Take 1 tablet (4 mg total) by mouth every 8 (eight) hours as needed for nausea or vomiting.  . potassium chloride (KLOR-CON SPRINKLE) 10 MEQ CR capsule TAKE 2 CAPSULES Once a day  . pramipexole (MIRAPEX) 0.25 MG tablet Take 2 tablets (0.5 mg total) by mouth 2 (two) times daily.  . predniSONE (DELTASONE) 5 MG tablet Take 1 tablet (5 mg total) by mouth daily with breakfast.  . simvastatin (ZOCOR) 20 MG tablet TAKE 1 TABLET DAILY  . sodium chloride (OCEAN) 0.65 % nasal spray Place 1 spray into the nose as needed for congestion.  Marland Kitchen SPIRIVA HANDIHALER 18 MCG inhalation capsule INHALE THE CONTENTS OF 1 CAPSULE DAILY  . traMADol (ULTRAM) 50 MG tablet Take 50 mg by mouth 2 (two) times daily as needed for moderate pain.  . [DISCONTINUED] ALPRAZolam (XANAX) 0.25 MG tablet Take 1 tablet (0.25 mg total) by mouth daily as needed  for anxiety.  . [DISCONTINUED] levofloxacin (LEVAQUIN) 750 MG tablet Take 1 tablet (750 mg total) by mouth daily.  Marland Kitchen levofloxacin (LEVAQUIN) 500 MG tablet Take 1 tablet (500 mg total) by mouth daily.   No facility-administered encounter medications on file as of 01/16/2017.     Review of Systems  Constitutional: Positive for fever. Negative for appetite change and unexpected weight change.  HENT: Positive for congestion and postnasal drip.   Respiratory: Positive for cough and wheezing. Negative for chest tightness and shortness of breath.   Cardiovascular: Negative for chest pain, palpitations and leg swelling.  Gastrointestinal: Negative for abdominal pain,  diarrhea, nausea and vomiting.  Genitourinary: Negative for difficulty urinating and dysuria.  Musculoskeletal: Negative for back pain and myalgias.  Skin: Negative for color change and rash.  Neurological: Negative for dizziness, light-headedness and headaches.  Psychiatric/Behavioral: Negative for agitation and dysphoric mood.       Objective:     Blood pressure rechecked by me:  132-134/68  Physical Exam  Constitutional: She appears well-developed and well-nourished. No distress.  HENT:  Nose: Nose normal.  Mouth/Throat: Oropharynx is clear and moist.  Neck: Neck supple. No thyromegaly present.  Cardiovascular: Normal rate and regular rhythm.   Pulmonary/Chest: Breath sounds normal. No respiratory distress. She has no wheezes.  Abdominal: Soft. Bowel sounds are normal. There is no tenderness.  Musculoskeletal: She exhibits no edema or tenderness.  Lymphadenopathy:    She has no cervical adenopathy.  Skin: No rash noted. No erythema.  Psychiatric: She has a normal mood and affect. Her behavior is normal.    BP 134/68   Pulse 66   Temp 99.3 F (37.4 C) (Oral)   Resp 12   Ht 5\' 4"  (1.626 m)   Wt 163 lb (73.9 kg)   SpO2 97%   BMI 27.98 kg/m  Wt Readings from Last 3 Encounters:  01/16/17 163 lb (73.9 kg)    01/03/17 160 lb 12.8 oz (72.9 kg)  12/23/16 161 lb (73 kg)     Lab Results  Component Value Date   WBC 17.7 Repeated and verified X2. (H) 01/16/2017   HGB 11.4 (L) 01/16/2017   HCT 34.2 (L) 01/16/2017   PLT 191.0 01/16/2017   GLUCOSE 112 (H) 01/16/2017   CHOL 115 06/28/2016   TRIG 72.0 06/28/2016   HDL 60.90 06/28/2016   LDLDIRECT 73.7 08/20/2013   LDLCALC 40 06/28/2016   ALT 21 12/22/2016   AST 34 12/22/2016   NA 134 (L) 01/16/2017   K 4.5 01/16/2017   CL 99 01/16/2017   CREATININE 0.97 01/16/2017   BUN 6 01/16/2017   CO2 29 01/16/2017   TSH 1.23 09/29/2016   INR 1.28 10/31/2016    Dg Chest 2 View  Result Date: 12/22/2016 CLINICAL DATA:  Fever, chills and weakness last night with dry cough x1 week. EXAM: CHEST  2 VIEW COMPARISON:  None. FINDINGS: The heart size and mediastinal contours are within normal limits. Superimposed on chronic bronchitic appearing change is a more focal airspace opacity in region of the lingula that may represent a small focus of pneumonia. Trace left effusion with blunting of the left lateral costophrenic angle. Tiny granuloma, vascular summation or bone island projects over the right posterior fifth rib. The visualized skeletal structures are unremarkable. IMPRESSION: 1. Chronic bronchitic change with small focus of superimposed airspace disease in the region of the lingula that may reflect pneumonia. 2. Tiny granuloma, best or bone island suspected in the right upper lobe. Electronically Signed   By: Ashley Royalty M.D.   On: 12/22/2016 15:58       Assessment & Plan:   Problem List Items Addressed This Visit    A-fib ([place])    In SR.  Pulse rate stable.  Follow.        Anemia    Recheck cbc today.       Cough   Relevant Orders   DG Chest 2 View (Completed)   Ambulatory referral to Pulmonology   GERD (gastroesophageal reflux disease)    Some occasional acid reflux.  Continue current regimen.       Hypercholesterolemia  On simvastatin.   Low cholesterol diet and exercise.  Follow lipid panel and liver function tests.        Hypertension    Blood pressure on recheck improved.  Same medication regimen.  Follow.  Recheck metabolic panel.        Pneumonia    Recently admitted as outlined.  Cough persisted and worsened over the last 24 hours.  Fever, congestion, wheezing as outlined.  Check cxr.  Restart levaquin, especially given previous rapid progression of illness.  Any change in symptoms or worsening problems, she is to be evaluated.  Check cbc.        Relevant Medications   levofloxacin (LEVAQUIN) 500 MG tablet   Rheumatoid arthritis (HCC)    Currently stable.  Discussed could not receive her infusion until infection cleared.  Continue f/u with Dr [name].         Other Visit Diagnoses    Fever, unspecified fever cause    -  Primary   Relevant Orders   DG Chest 2 View (Completed)   CBC with Differential/Platelet (Completed)   Basic metabolic panel (Completed)   Recurrent pneumonia       Relevant Medications   levofloxacin (LEVAQUIN) 500 MG tablet   Other Relevant Orders   Ambulatory referral to Pulmonology       [name], MD

## 2017-01-17 ENCOUNTER — Other Ambulatory Visit: Payer: Self-pay | Admitting: Internal Medicine

## 2017-01-17 ENCOUNTER — Telehealth: Payer: Self-pay | Admitting: Pulmonary Disease

## 2017-01-17 ENCOUNTER — Encounter: Payer: Self-pay | Admitting: Internal Medicine

## 2017-01-17 DIAGNOSIS — D72829 Elevated white blood cell count, unspecified: Secondary | ICD-10-CM

## 2017-01-17 DIAGNOSIS — E7800 Pure hypercholesterolemia, unspecified: Secondary | ICD-10-CM

## 2017-01-17 DIAGNOSIS — E871 Hypo-osmolality and hyponatremia: Secondary | ICD-10-CM

## 2017-01-17 DIAGNOSIS — I10 Essential (primary) hypertension: Secondary | ICD-10-CM

## 2017-01-17 NOTE — Assessment & Plan Note (Signed)
Some occasional acid reflux.  Continue current regimen.

## 2017-01-17 NOTE — Assessment & Plan Note (Signed)
In SR.  Pulse rate stable.  Follow.

## 2017-01-17 NOTE — Assessment & Plan Note (Signed)
Recently admitted as outlined.  Cough persisted and worsened over the last 24 hours.  Fever, congestion, wheezing as outlined.  Check cxr.  Restart levaquin, especially given previous rapid progression of illness.  Any change in symptoms or worsening problems, she is to be evaluated.  Check cbc.

## 2017-01-17 NOTE — Assessment & Plan Note (Signed)
Currently stable.  Discussed could not receive her infusion until infection cleared.  Continue f/u with Dr [name].

## 2017-01-17 NOTE — Telephone Encounter (Signed)
Error

## 2017-01-17 NOTE — Progress Notes (Signed)
Order placed for f/u labs.  

## 2017-01-17 NOTE — Telephone Encounter (Signed)
Correction - Consult Routine

## 2017-01-17 NOTE — Assessment & Plan Note (Signed)
Recheck cbc today.   

## 2017-01-17 NOTE — Telephone Encounter (Signed)
Received referral in wq for t to be seen urgent appt for cough recurrent pneumonia

## 2017-01-17 NOTE — Assessment & Plan Note (Signed)
On simvastatin.  Low cholesterol diet and exercise.  Follow lipid panel and liver function tests.   

## 2017-01-17 NOTE — Assessment & Plan Note (Signed)
Blood pressure on recheck improved.  Same medication regimen.  Follow.  Recheck metabolic panel.

## 2017-01-18 ENCOUNTER — Telehealth: Payer: Self-pay | Admitting: *Deleted

## 2017-01-18 NOTE — Telephone Encounter (Signed)
Medication Refill requested for : Nystatin  Pharmacy: [facility] on [address]  Return Contact :

## 2017-01-20 MED ORDER — NYSTATIN 100000 UNIT/ML MT SUSP: 5.0000 mL | Freq: Four times a day (QID) | OROMUCOSAL | 0 refills | Status: DC

## 2017-01-20 NOTE — Telephone Encounter (Signed)
Last given 05/30/16 Last o/v 01/16/17  F/u 04/20/17

## 2017-01-20 NOTE — Telephone Encounter (Signed)
Called patent script sent in for mouth wash

## 2017-01-20 NOTE — Progress Notes (Signed)
   12/23/16 1108  Acute Rehab PT Goals  Patient Stated Goal To get my strength back  PT Goal Formulation With patient  Time For Goal Achievement 01/05/17  Potential to Achieve Goals Good  PT Time Calculation  PT Start Time (ACUTE ONLY) 0930  PT Stop Time (ACUTE ONLY) 0958  PT Time Calculation (min) (ACUTE ONLY) 28 min  PT G-Codes **NOT FOR INPATIENT CLASS**  Functional Assessment Tool Used AM-PAC 6 Clicks Basic Mobility  Functional Limitation Mobility: Walking and moving around  Mobility: Walking and Moving Around Current Status (F5825) CJ  Mobility: Walking and Moving Around Goal Status (P8984) CI  PT General Charges  $$ ACUTE PT VISIT 1 Procedure  PT Evaluation  $PT Eval Low Complexity 1 Procedure  PT Treatments  $Therapeutic Exercise 8-22 mins   Late-entry g-codes added after review of initial evaluation/documentation by Cy Blamer, PT.  Kaitlyn Huff. Owens Shark, PT, DPT, NCS 01/20/17, 10:49 AM [phone]

## 2017-01-20 NOTE — Telephone Encounter (Signed)
Need to confirm if she is wanting nystatin cream or mouthwash.  Ok to refill x 1

## 2017-01-22 ENCOUNTER — Other Ambulatory Visit: Payer: Self-pay | Admitting: Internal Medicine

## 2017-01-23 ENCOUNTER — Telehealth: Payer: Self-pay | Admitting: Internal Medicine

## 2017-01-23 NOTE — Telephone Encounter (Signed)
This is the pt that you called mail order and they were going to fax the rx and that she should have the rx by the end of the week - of her appt.  We gave her a rx for xanax to take to local pharmacy to get her through until her mail order came.  She should have received her mail order.  Can we call mail order and see if they sent medication.

## 2017-01-23 NOTE — Telephone Encounter (Signed)
This was ordered as a 7 day supply only, I don't see why? Patient is requesting a refill, please advise, thanks

## 2017-01-23 NOTE — Telephone Encounter (Signed)
Pt called and stated that Dr. Nicki Reaper needs to call in an override for her ALPRAZolam (XANAX) 0.25 MG tablet. Please advise, thank you!  [facility] [place], [place]

## 2017-01-27 NOTE — Telephone Encounter (Signed)
Patient received script

## 2017-02-06 DIAGNOSIS — M0579 Rheumatoid arthritis with rheumatoid factor of multiple sites without organ or systems involvement: Secondary | ICD-10-CM | POA: Diagnosis not present

## 2017-02-08 ENCOUNTER — Other Ambulatory Visit: Payer: Self-pay

## 2017-02-08 ENCOUNTER — Other Ambulatory Visit: Payer: Medicare Other

## 2017-02-09 ENCOUNTER — Other Ambulatory Visit: Payer: Self-pay

## 2017-02-09 DIAGNOSIS — M069 Rheumatoid arthritis, unspecified: Secondary | ICD-10-CM | POA: Diagnosis not present

## 2017-02-09 DIAGNOSIS — J209 Acute bronchitis, unspecified: Secondary | ICD-10-CM

## 2017-02-09 DIAGNOSIS — J189 Pneumonia, unspecified organism: Secondary | ICD-10-CM | POA: Diagnosis not present

## 2017-02-09 DIAGNOSIS — R262 Difficulty in walking, not elsewhere classified: Secondary | ICD-10-CM | POA: Diagnosis not present

## 2017-02-09 DIAGNOSIS — I081 Rheumatic disorders of both mitral and tricuspid valves: Secondary | ICD-10-CM | POA: Diagnosis not present

## 2017-02-09 MED ORDER — FIRST-DUKES MOUTHWASH MT SUSP: OROMUCOSAL | 0 refills | Status: DC

## 2017-02-09 NOTE — Telephone Encounter (Signed)
Rx request for Dukes Magic Mouth Wash  Last refilled 12/22/2016 Last OV 01/16/2017 Next OV 04/20/2017

## 2017-02-17 ENCOUNTER — Other Ambulatory Visit (INDEPENDENT_AMBULATORY_CARE_PROVIDER_SITE_OTHER): Payer: Medicare Other

## 2017-02-17 DIAGNOSIS — E7800 Pure hypercholesterolemia, unspecified: Secondary | ICD-10-CM | POA: Diagnosis not present

## 2017-02-17 DIAGNOSIS — E871 Hypo-osmolality and hyponatremia: Secondary | ICD-10-CM

## 2017-02-17 DIAGNOSIS — D72829 Elevated white blood cell count, unspecified: Secondary | ICD-10-CM

## 2017-02-17 LAB — CBC WITH DIFFERENTIAL/PLATELET
Basophils Absolute: 0.1 10*3/uL (ref 0.0–0.1)
Basophils Relative: 0.8 % (ref 0.0–3.0)
EOS ABS: 0.2 10*3/uL (ref 0.0–0.7)
EOS PCT: 2.8 % (ref 0.0–5.0)
HCT: 36.1 % (ref 36.0–46.0)
HEMOGLOBIN: 12.1 g/dL (ref 12.0–15.0)
Lymphocytes Relative: 42.8 % (ref 12.0–46.0)
Lymphs Abs: 3.1 10*3/uL (ref 0.7–4.0)
MCHC: 33.4 g/dL (ref 30.0–36.0)
MCV: 92.4 fl (ref 78.0–100.0)
Monocytes Absolute: 0.7 10*3/uL (ref 0.1–1.0)
Monocytes Relative: 9.5 % (ref 3.0–12.0)
Neutro Abs: 3.2 10*3/uL (ref 1.4–7.7)
Neutrophils Relative %: 44.1 % (ref 43.0–77.0)
Platelets: 199 10*3/uL (ref 150.0–400.0)
RBC: 3.9 Mil/uL (ref 3.87–5.11)
RDW: 13.5 % (ref 11.5–15.5)
WBC: 7.2 10*3/uL (ref 4.0–10.5)

## 2017-02-17 LAB — HEPATIC FUNCTION PANEL
ALK PHOS: 41 U/L (ref 39–117)
ALT: 19 U/L (ref 0–35)
AST: 27 U/L (ref 0–37)
Albumin: 4.3 g/dL (ref 3.5–5.2)
BILIRUBIN DIRECT: 0.1 mg/dL (ref 0.0–0.3)
BILIRUBIN TOTAL: 0.5 mg/dL (ref 0.2–1.2)
Total Protein: 7.5 g/dL (ref 6.0–8.3)

## 2017-02-17 LAB — LIPID PANEL
CHOL/HDL RATIO: 2
Cholesterol: 153 mg/dL (ref 0–200)
HDL: 80.4 mg/dL (ref >39.00)
LDL CALC: 46 mg/dL (ref 0–99)
NonHDL: 72.94
Triglycerides: 135 mg/dL (ref 0.0–149.0)
VLDL: 27 mg/dL (ref 0.0–40.0)

## 2017-02-17 LAB — SODIUM: SODIUM: 134 meq/L — AB (ref 135–145)

## 2017-02-22 ENCOUNTER — Other Ambulatory Visit: Payer: Self-pay | Admitting: Internal Medicine

## 2017-02-22 DIAGNOSIS — Z1231 Encounter for screening mammogram for malignant neoplasm of breast: Secondary | ICD-10-CM

## 2017-02-24 ENCOUNTER — Ambulatory Visit (INDEPENDENT_AMBULATORY_CARE_PROVIDER_SITE_OTHER): Payer: Medicare Other | Admitting: Pulmonary Disease

## 2017-02-24 ENCOUNTER — Encounter: Payer: Self-pay | Admitting: Pulmonary Disease

## 2017-02-24 VITALS — BP 138/80 | HR 67 | Ht 64.0 in | Wt 163.0 lb

## 2017-02-24 DIAGNOSIS — M069 Rheumatoid arthritis, unspecified: Secondary | ICD-10-CM

## 2017-02-24 DIAGNOSIS — Z23 Encounter for immunization: Secondary | ICD-10-CM

## 2017-02-24 DIAGNOSIS — D849 Immunodeficiency, unspecified: Secondary | ICD-10-CM

## 2017-02-24 DIAGNOSIS — J189 Pneumonia, unspecified organism: Secondary | ICD-10-CM | POA: Diagnosis not present

## 2017-02-24 DIAGNOSIS — D899 Disorder involving the immune mechanism, unspecified: Secondary | ICD-10-CM

## 2017-02-24 MED ORDER — FAMOTIDINE 40 MG PO TABS: 40.0000 mg | ORAL_TABLET | Freq: Every day | ORAL | 10 refills | Status: DC

## 2017-02-24 NOTE — Patient Instructions (Addendum)
Discontinue Advair inhaler Continue Spiriva inhaler Discontinue Nexium Pepcid 40 mg daily at bedtime Use Tums or equivalent for acid reflux symptoms Pneumonia shot today Follow-up in 6-8 weeks with chest x-ray

## 2017-02-25 ENCOUNTER — Other Ambulatory Visit: Payer: Self-pay | Admitting: Family Medicine

## 2017-03-02 NOTE — Progress Notes (Signed)
PULMONARY OFFICE FOLLOW UP NOTE  Requesting MD/Service: [name], MD Date of initial consultation: 10/06/15 Reason for consultation: Recurrent PNAs   INITIAL HPI 10/06/15:  16 F referred for evaluation of recurrent PNAs. She has been hospitalized twice in past 3 months, most recently 02/07-02/11/17. There was no organism identified but she had a definite infiltrate on the R on CXR. Previously, she was diagnosed with PNA in late January when a CT chest revealed minimal lingular atelectasis or infiltrate. On the day of this evaluation, she states that she feels "wonderful". She has no cough or sputum production. She denies fever and chest pain. Her exercise tolerance is at her baseline. She has previously been diagnosed with COPD and is on Advair and tiotropium but she has never smoked and never had PFTs performed.   INITIAL IMP/PLAN: Dyspnea, recurrent pneumonias, history of positive PPD (S/P INH chemoprophylaxis), immunosuppression - Expectant managememt  SUBJ: Since last seen, she has been hospitalized 3 times with diagnoses of pneumonia. Presently she is back to her baseline. She remains on immunosuppression for RA. She is also on an ICS for "COPD" though she has never smoked and never had PFTs performed. She is on Nexium for a prior problem with heartburn. She does not have any HB symptoms presently.   OBJ:  Vitals:   02/24/17 1430 02/24/17 1436  BP:  138/80  Pulse:  67  SpO2:  94%  Weight: 163 lb (73.9 kg)   Height: 5\' 4"  (1.626 m)     EXAM:  Gen: No overt respiratory distress HEENT: WNL Neck: Supple without LAN Lungs: breath sounds full, No adventitious sounds Cardiovascular: Reg, no murmurs noted Abdomen: Soft, nontender, normal BS Ext: without clubbing, cyanosis, edema Neuro: grossly intact  DATA:   BMP Latest Ref Rng & Units 02/17/2017 01/16/2017 12/23/2016  Glucose 70 - 99 mg/dL - 112(H) 98  BUN 6 - 23 mg/dL - 6 17  Creatinine 0.40 - 1.20 mg/dL - 0.97 0.83   Sodium 135 - 145 mEq/L 134(L) 134(L) 140  Potassium 3.5 - 5.1 mEq/L - 4.5 3.8  Chloride 96 - 112 mEq/L - 99 109  CO2 19 - 32 mEq/L - 29 27  Calcium 8.4 - 10.5 mg/dL - 9.2 8.4(L)    CBC Latest Ref Rng & Units 02/17/2017 01/16/2017 01/03/2017  WBC 4.0 - 10.5 K/uL 7.2 17.7 Repeated and verified X2.(H) 9.3  Hemoglobin 12.0 - 15.0 g/dL 12.1 11.4(L) 11.1(L)  Hematocrit 36.0 - 46.0 % 36.1 34.2(L) 33.0(L)  Platelets 150.0 - 400.0 K/uL 199.0 191.0 251.0    CXR:  01/16/17: No acute findings  IMPRESSION: Recurrent pneumonias Chronic immunosuppression for RA - necessary medication but increasing her risk of PNA  She is also on an ICS which is probably not of much benefit but have been associated with a modest increased risk of pneumonia and on a PPI, a class of medications associated with a 2 fold increased risk of pneumonia  PLAN:  Discontinue Advair inhaler Continue Spiriva inhaler Discontinue Nexium Pepcid 40 mg daily at bedtime Use Tums or equivalent for acid reflux symptoms Pneumonia shot today Follow-up in 6-8 weeks with chest x-ray  Merton Border, MD PCCM service Mobile [phone] Pager [phone] 03/02/2017 11:50 PM

## 2017-03-02 NOTE — Telephone Encounter (Signed)
error 

## 2017-03-07 ENCOUNTER — Ambulatory Visit
Admission: RE | Admit: 2017-03-07 | Discharge: 2017-03-07 | Disposition: A | Discharge status: Home / Self Care | Payer: Medicare Other | Source: Ambulatory Visit | Attending: Internal Medicine | Admitting: Internal Medicine

## 2017-03-07 DIAGNOSIS — Z1231 Encounter for screening mammogram for malignant neoplasm of breast: Secondary | ICD-10-CM | POA: Diagnosis not present

## 2017-03-08 DIAGNOSIS — G608 Other hereditary and idiopathic neuropathies: Secondary | ICD-10-CM | POA: Diagnosis not present

## 2017-03-29 DIAGNOSIS — R413 Other amnesia: Secondary | ICD-10-CM | POA: Diagnosis not present

## 2017-03-29 DIAGNOSIS — R42 Dizziness and giddiness: Secondary | ICD-10-CM | POA: Diagnosis not present

## 2017-03-29 DIAGNOSIS — G608 Other hereditary and idiopathic neuropathies: Secondary | ICD-10-CM | POA: Diagnosis not present

## 2017-03-29 DIAGNOSIS — G2581 Restless legs syndrome: Secondary | ICD-10-CM | POA: Diagnosis not present

## 2017-03-29 DIAGNOSIS — G4733 Obstructive sleep apnea (adult) (pediatric): Secondary | ICD-10-CM | POA: Diagnosis not present

## 2017-04-03 DIAGNOSIS — M0579 Rheumatoid arthritis with rheumatoid factor of multiple sites without organ or systems involvement: Secondary | ICD-10-CM | POA: Diagnosis not present

## 2017-04-10 ENCOUNTER — Other Ambulatory Visit: Payer: Self-pay | Admitting: Internal Medicine

## 2017-04-13 ENCOUNTER — Ambulatory Visit (INDEPENDENT_AMBULATORY_CARE_PROVIDER_SITE_OTHER): Payer: Medicare Other | Admitting: Vascular Surgery

## 2017-04-13 ENCOUNTER — Telehealth: Payer: Self-pay | Admitting: Internal Medicine

## 2017-04-13 ENCOUNTER — Encounter (INDEPENDENT_AMBULATORY_CARE_PROVIDER_SITE_OTHER): Payer: Self-pay | Admitting: Vascular Surgery

## 2017-04-13 VITALS — BP 137/77 | HR 58 | Resp 16 | Wt 166.0 lb

## 2017-04-13 DIAGNOSIS — I8311 Varicose veins of right lower extremity with inflammation: Secondary | ICD-10-CM

## 2017-04-13 DIAGNOSIS — I8312 Varicose veins of left lower extremity with inflammation: Secondary | ICD-10-CM

## 2017-04-13 MED ORDER — ALPRAZOLAM 0.25 MG PO TABS: 0.2500 mg | ORAL_TABLET | Freq: Every day | ORAL | 0 refills | Status: DC | PRN

## 2017-04-13 NOTE — Telephone Encounter (Signed)
Per med history review, she received rx for #90 with no refills on 01/03/17.  ok'd alprazolam #90 with no refills.  rx signed and placed in box.

## 2017-04-13 NOTE — Progress Notes (Signed)
    MRN : [id number]  Kaitlyn Huff is a 76 y.o. (1940/07/31) female who presents with chief complaint of No chief complaint on file. .   Procedure:  Sclerotherapy using hypertonic saline mixed with 1% Lidocaine was performed on lower extremities bilateral.  Compression wraps were placed.  The patient tolerated the procedure well.  Plan:  Follow up as arranged

## 2017-04-13 NOTE — Telephone Encounter (Signed)
For some reason the last script I see is from 01/16/17 for #7 but I know that you gave her 90 day lately. Ok to refill?

## 2017-04-13 NOTE — Telephone Encounter (Signed)
Faxed to mail order. 

## 2017-04-13 NOTE — Telephone Encounter (Signed)
Pt called requesting a refill on her ALPRAZolam (XANAX) 0.25 MG tablet. Please advise, thank you!  Pharmacy - [place], [place]  Call pt @ [phone]

## 2017-04-17 ENCOUNTER — Encounter: Payer: Self-pay | Admitting: Pulmonary Disease

## 2017-04-17 ENCOUNTER — Ambulatory Visit
Admission: RE | Admit: 2017-04-17 | Discharge: 2017-04-17 | Disposition: A | Discharge status: Home / Self Care | Payer: Medicare Other | Source: Ambulatory Visit | Attending: Pulmonary Disease | Admitting: Pulmonary Disease

## 2017-04-17 ENCOUNTER — Ambulatory Visit (INDEPENDENT_AMBULATORY_CARE_PROVIDER_SITE_OTHER): Payer: Medicare Other | Admitting: Pulmonary Disease

## 2017-04-17 VITALS — BP 140/88 | HR 62 | Ht 64.0 in | Wt 168.0 lb

## 2017-04-17 DIAGNOSIS — R05 Cough: Secondary | ICD-10-CM | POA: Diagnosis not present

## 2017-04-17 DIAGNOSIS — I700 Atherosclerosis of aorta: Secondary | ICD-10-CM | POA: Insufficient documentation

## 2017-04-17 DIAGNOSIS — I517 Cardiomegaly: Secondary | ICD-10-CM | POA: Insufficient documentation

## 2017-04-17 DIAGNOSIS — J189 Pneumonia, unspecified organism: Secondary | ICD-10-CM

## 2017-04-17 NOTE — Patient Instructions (Signed)
You may try off of Spiriva inhaler and remain off of it if you notice no change in your breathing symptoms  Follow-up in 6 months or sooner as needed

## 2017-04-18 NOTE — Progress Notes (Signed)
PULMONARY OFFICE FOLLOW UP NOTE  Requesting MD/Service: [name], MD Date of initial consultation: 10/06/15 Reason for consultation: Recurrent PNAs   PROBLEMS: RA - chronic immunosuppression  Recurrent pneumonias History of positive PPD (S/P INH chemoprophylaxis)  SUBJ: Last visit, I recommended that she discontinue Advair and Nexium with the thoughts that these medications might be increasing her risk of pneumonia. She has done well off of Advair. However, she has had significant increasing symptoms of dyspepsia and GERD after discontinuing the Nexium. Therefore, she resume this medication. Otherwise, she has no new complaints.   OBJ:  Vitals:   04/17/17 1344 04/17/17 1348  BP:  140/88  Pulse:  62  SpO2:  94%  Weight: 76.2 kg (168 lb)   Height: 5\' 4"  (1.626 m)     EXAM:  Gen: No overt respiratory distress HEENT: WNL Neck: Supple without LAN Lungs: breath sounds full, No adventitious sounds Cardiovascular: Reg, no murmurs noted Abdomen: Soft, nontender, normal BS Ext: without clubbing, cyanosis, edema Neuro: grossly intact  DATA:   BMP Latest Ref Rng & Units 02/17/2017 01/16/2017 12/23/2016  Glucose 70 - 99 mg/dL - 112(H) 98  BUN 6 - 23 mg/dL - 6 17  Creatinine 0.40 - 1.20 mg/dL - 0.97 0.83  Sodium 135 - 145 mEq/L 134(L) 134(L) 140  Potassium 3.5 - 5.1 mEq/L - 4.5 3.8  Chloride 96 - 112 mEq/L - 99 109  CO2 19 - 32 mEq/L - 29 27  Calcium 8.4 - 10.5 mg/dL - 9.2 8.4(L)    CBC Latest Ref Rng & Units 02/17/2017 01/16/2017 01/03/2017  WBC 4.0 - 10.5 K/uL 7.2 17.7 Repeated and verified X2.(H) 9.3  Hemoglobin 12.0 - 15.0 g/dL 12.1 11.4(L) 11.1(L)  Hematocrit 36.0 - 46.0 % 36.1 34.2(L) 33.0(L)  Platelets 150.0 - 400.0 K/uL 199.0 191.0 251.0    CXR 04/17/17: No acute findings  IMPRESSION: Recurrent pneumonias due to chronic immunosuppression for RA. She was also on an ICS which have been shown to be associated with a modest increased risk of pneumonia and on a PPI, also  possibly associated with increased risk of pneumonia.  She has done well off of ICS/LABA with no new or worsening respiratory symptoms. However, she did not tolerate discontinuation of PPI (Nexium). She suffered severe rebound gastritis and GERD.   I doubt that Spiriva is providing her any benefit  PLAN:  Discontinue Spiriva inhaler and remain off if no new or worsening respiratory symptoms Flu shot recommended Follow-up in 6-8 months or as needed. If she is hospitalized for any respiratory or pulmonary problems, she knows to ask her admitting MD to notify our service  Merton Border, MD PCCM service Mobile [phone] Pager [phone] 04/18/2017 12:21 PM

## 2017-04-20 ENCOUNTER — Ambulatory Visit (INDEPENDENT_AMBULATORY_CARE_PROVIDER_SITE_OTHER): Payer: Medicare Other | Admitting: Internal Medicine

## 2017-04-20 ENCOUNTER — Ambulatory Visit (INDEPENDENT_AMBULATORY_CARE_PROVIDER_SITE_OTHER): Payer: Medicare Other

## 2017-04-20 ENCOUNTER — Encounter: Payer: Self-pay | Admitting: Internal Medicine

## 2017-04-20 VITALS — BP 132/78 | HR 60 | Temp 98.6°F | Resp 14 | Ht 64.0 in | Wt 167.4 lb

## 2017-04-20 DIAGNOSIS — K219 Gastro-esophageal reflux disease without esophagitis: Secondary | ICD-10-CM

## 2017-04-20 DIAGNOSIS — M79675 Pain in left toe(s): Secondary | ICD-10-CM

## 2017-04-20 DIAGNOSIS — I4891 Unspecified atrial fibrillation: Secondary | ICD-10-CM | POA: Diagnosis not present

## 2017-04-20 DIAGNOSIS — M19072 Primary osteoarthritis, left ankle and foot: Secondary | ICD-10-CM | POA: Diagnosis not present

## 2017-04-20 DIAGNOSIS — E7800 Pure hypercholesterolemia, unspecified: Secondary | ICD-10-CM | POA: Diagnosis not present

## 2017-04-20 DIAGNOSIS — G2581 Restless legs syndrome: Secondary | ICD-10-CM

## 2017-04-20 DIAGNOSIS — M069 Rheumatoid arthritis, unspecified: Secondary | ICD-10-CM | POA: Diagnosis not present

## 2017-04-20 DIAGNOSIS — M79672 Pain in left foot: Secondary | ICD-10-CM

## 2017-04-20 DIAGNOSIS — I10 Essential (primary) hypertension: Secondary | ICD-10-CM | POA: Diagnosis not present

## 2017-04-20 NOTE — Progress Notes (Signed)
Patient ID: Kaitlyn Huff, female   DOB: 1941/04/09, 76 y.o.   MRN: [id number]   Subjective:    Patient ID: Kaitlyn Huff, female    DOB: 07-13-1940, 76 y.o.   MRN: [id number]  HPI  Patient here for a scheduled follow up.  She reports she feels better.  Breathing better.  No chest pain.  Previous recurrent pneumonia.  Seeing Dr Alva Garnet.  He recommended discontinuing Advair and nexium.  She had increased acid reflux off nexium.  Back on nexium now.  Recommended discontinuing spiriva on recent visit 04/17/17.  Recommended f/u in 6 months.  No abdominal pain.  Bowels moving.  Handling stress.  Again overall feels better.  Foot pain as outlined.  Pain localized bas of fourth toe and toe.  No known injury.     Past Medical History:  Diagnosis Date  . Anemia   . Cancer ([place])   . Diverticulitis   . GERD (gastroesophageal reflux disease)   . Hyperlipidemia   . Hypertension   . Osteoarthritis   . Osteoporosis    actonel  . Positive PPD    s/p INH (2006)  . Rheumatoid arthritis(714.0)    MTX transaminitis, Leflunomide (rash), enbrel, plaquinil, prednisone, remicade, Imuran (transaminitis)  . 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 ABDOMINAL HYSTERECTOMY    . CERVICAL CONE BIOPSY    . CHOLECYSTECTOMY  06/22/14  . COLONOSCOPY WITH PROPOFOL N/A 07/09/2015   Procedure: COLONOSCOPY WITH PROPOFOL;  Surgeon: Manya Silvas, MD;  Location: [facility] ENDOSCOPY;  Service: Endoscopy;  Laterality: N/A;  . ESOPHAGOGASTRODUODENOSCOPY (EGD) WITH PROPOFOL N/A 07/09/2015   Procedure: ESOPHAGOGASTRODUODENOSCOPY (EGD) WITH PROPOFOL;  Surgeon: Manya Silvas, MD;  Location: [facility] ENDOSCOPY;  Service: Endoscopy;  Laterality: N/A;  . OVARY SURGERY    . TRACHEOSTOMY  1959  . TUBAL LIGATION     Family History  Problem Relation Age of Onset  . Heart disease Father        MI  . Heart disease Mother   . Valvular heart disease Mother   . Breast  cancer Sister 71  . Cancer Sister        Lung cancer  . Colon cancer Neg Hx    Social History   Social History  . Marital status: Widowed    Spouse name: N/A  . Number of children: 4  . Years of education: N/A   Occupational History  . retired    Social History Main Topics  . Smoking status: Never Smoker  . Smokeless tobacco: Never Used  . Alcohol use No  . Drug use: No  . Sexual activity: No   Other Topics Concern  . None   Social History Narrative   Lives with family at home    Outpatient Encounter Prescriptions as of 04/20/2017  Medication Sig  . acidophilus (RISAQUAD) CAPS capsule Take 1 capsule by mouth daily.  Marland Kitchen ADVAIR DISKUS 250-50 MCG/DOSE AEPB USE 1 INHALATION EVERY 12 HOURS  . albuterol (PROAIR HFA) 108 (90 Base) MCG/ACT inhaler USE 2 INHALATIONS EVERY 6 HOURS AS NEEDED FOR WHEEZING OR SHORTNESS OF BREATH  . albuterol (PROVENTIL) (2.5 MG/3ML) 0.083% nebulizer solution Take 2.5 mg by nebulization every 6 (six) hours as needed for wheezing or shortness of breath.  Marland Kitchen alendronate (FOSAMAX) 70 MG tablet Take 70 mg by mouth once a week.   . ALPRAZolam (XANAX) 0.25 MG tablet Take 1 tablet (0.25 mg total)  by mouth daily as needed for anxiety.  Marland Kitchen aspirin EC 81 MG tablet Take 81 mg by mouth daily.  . Calcium Carbonate-Vitamin D (CALCIUM 600+D) 600-400 MG-UNIT tablet Take 1 tablet by mouth 2 (two) times daily.   . cetirizine (ZYRTEC) 10 MG tablet TAKE 1 TABLET DAILY  . cholecalciferol (VITAMIN D) 1000 units tablet Take 1,000 Units by mouth daily.  . colestipol (COLESTID) 1 g tablet Take 1 g by mouth daily.   . Diphenhyd-Hydrocort-Nystatin (FIRST-DUKES MOUTHWASH) SUSP Pt is to swish and spit 5 cc's tid  . esomeprazole (NEXIUM) 20 MG capsule Take 20 mg by mouth daily at 12 noon.  Marland Kitchen FeFum-FePo-FA-B Cmp-C-Zn-Mn-Cu (TANDEM PLUS) 162-115.2-1 MG CAPS TAKE 1 CAPSULE DAILY  . fluticasone (FLONASE) 50 MCG/ACT nasal spray Place 2 sprays into both nostrils daily. (Patient taking  differently: Place 2 sprays into both nostrils 2 (two) times daily. )  . furosemide (LASIX) 20 MG tablet Take 2 tablets (40 mg total) by mouth daily.  Marland Kitchen gabapentin (NEURONTIN) 300 MG capsule Take 300 mg by mouth 4 (four) times daily.   Marland Kitchen guaiFENesin-dextromethorphan (ROBITUSSIN DM) 100-10 MG/5ML syrup Take 5 mLs by mouth every 4 (four) hours as needed for cough.  . lip balm (BLISTEX) OINT Apply 1 application topically as needed for lip care.  . meclizine (ANTIVERT) 25 MG tablet Take 1 tablet (25 mg total) by mouth 3 (three) times daily as needed for dizziness.  . Multiple Vitamin (MULTIVITAMIN WITH MINERALS) TABS tablet Take 1 tablet by mouth daily.  Marland Kitchen nystatin (MYCOSTATIN) 100000 UNIT/ML suspension Take 5 mLs (500,000 Units total) by mouth 4 (four) times daily.  Marland Kitchen nystatin cream (MYCOSTATIN) APPLY 1 APPLICATION TOPICALLY TO THE AFFECTED AREA (OUTER VAGINA) TWICE A DAY  . ondansetron (ZOFRAN ODT) 4 MG disintegrating tablet Take 1 tablet (4 mg total) by mouth every 8 (eight) hours as needed for nausea or vomiting.  . potassium chloride (KLOR-CON SPRINKLE) 10 MEQ CR capsule TAKE 2 CAPSULES Once a day  . predniSONE (DELTASONE) 5 MG tablet Take 1 tablet (5 mg total) by mouth daily with breakfast.  . simvastatin (ZOCOR) 20 MG tablet TAKE 1 TABLET DAILY  . sodium chloride (OCEAN) 0.65 % nasal spray Place 1 spray into the nose as needed for congestion.  Marland Kitchen SPIRIVA HANDIHALER 18 MCG inhalation capsule INHALE THE CONTENTS OF 1 CAPSULE DAILY  . traMADol (ULTRAM) 50 MG tablet Take 50 mg by mouth 2 (two) times daily as needed for moderate pain.  . [DISCONTINUED] hydroxychloroquine (PLAQUENIL) 200 MG tablet Take 1 tablet (200 mg total) by mouth 2 (two) times daily.  . hydroxychloroquine (PLAQUENIL) 200 MG tablet   . [DISCONTINUED] famotidine (PEPCID) 40 MG tablet Take 1 tablet (40 mg total) by mouth at bedtime.  . [DISCONTINUED] levofloxacin (LEVAQUIN) 500 MG tablet Take 1 tablet (500 mg total) by mouth daily.     No facility-administered encounter medications on file as of 04/20/2017.   '  Review of Systems  Constitutional: Negative for appetite change and unexpected weight change.  HENT: Negative for congestion and sinus pressure.   Respiratory: Negative for cough, chest tightness and shortness of breath.   Cardiovascular: Negative for chest pain, palpitations and leg swelling.  Gastrointestinal: Negative for abdominal pain, diarrhea, nausea and vomiting.  Genitourinary: Negative for difficulty urinating and dysuria.  Musculoskeletal: Negative for arthralgias and myalgias.  Skin: Negative for color change and rash.  Neurological: Negative for dizziness, light-headedness and headaches.  Psychiatric/Behavioral: Negative for agitation and dysphoric mood.  Objective:     Blood pressure rechecked by me:  132/78  Physical Exam  Constitutional: She appears well-developed and well-nourished. No distress.  HENT:  Nose: Nose normal.  Mouth/Throat: Oropharynx is clear and moist.  Neck: Neck supple. No thyromegaly present.  Cardiovascular: Normal rate and regular rhythm.   Pulmonary/Chest: Breath sounds normal. No respiratory distress. She has no wheezes.  Abdominal: Soft. Bowel sounds are normal. There is no tenderness.  Musculoskeletal: She exhibits no edema or tenderness.  Lymphadenopathy:    She has no cervical adenopathy.  Skin: No rash noted. No erythema.  Psychiatric: She has a normal mood and affect. Her behavior is normal.    BP (!) 148/86 (BP Location: Left Arm, Patient Position: Sitting, Cuff Size: Normal)   Pulse 60   Temp 98.6 F (37 C) (Oral)   Resp 14   Ht 5\' 4"  (1.626 m)   Wt 167 lb 6.4 oz (75.9 kg)   SpO2 96%   BMI 28.73 kg/m  Wt Readings from Last 3 Encounters:  04/20/17 167 lb 6.4 oz (75.9 kg)  04/17/17 168 lb (76.2 kg)  04/13/17 166 lb (75.3 kg)     Lab Results  Component Value Date   WBC 7.2 02/17/2017   HGB 12.1 02/17/2017   HCT 36.1 02/17/2017    PLT 199.0 02/17/2017   GLUCOSE 112 (H) 01/16/2017   CHOL 153 02/17/2017   TRIG 135.0 02/17/2017   HDL 80.40 02/17/2017   LDLDIRECT 73.7 08/20/2013   LDLCALC 46 02/17/2017   ALT 19 02/17/2017   AST 27 02/17/2017   NA 134 (L) 02/17/2017   K 4.5 01/16/2017   CL 99 01/16/2017   CREATININE 0.97 01/16/2017   BUN 6 01/16/2017   CO2 29 01/16/2017   TSH 1.23 09/29/2016   INR 1.28 10/31/2016    Dg Chest 2 View  Result Date: 04/17/2017 CLINICAL DATA:  Multiple episodes of pneumonia this year. Chronic persistent nonproductive cough. No other symptoms. History of gastroesophageal reflux, positive PPD with INH treatment never smoked. EXAM: CHEST  2 VIEW COMPARISON:  PA and lateral chest x-ray of January 16, 2017 FINDINGS: The lungs are slightly less well inflated on the current study. The interstitial markings remain increased. There is no alveolar infiltrate. There is no pleural effusion. The cardiac silhouette is mildly enlarged but is stable. The pulmonary vascularity is normal. There is calcification in the wall of the aortic arch and descending thoracic aorta. The bony thorax is unremarkable. IMPRESSION: Stable chronic interstitial changes. Mild cardiomegaly without pulmonary vascular congestion or pulmonary edema. Thoracic aortic atherosclerosis. Electronically Signed   By: David  Martinique M.D.   On: 04/17/2017 16:16       Assessment & Plan:   Problem List Items Addressed This Visit    A-fib ([place])    In SR.  Stable.        GERD (gastroesophageal reflux disease)    Tried to stop nexium.  Had return of symptoms.  Back on nexium now.  Symptoms controlled.        Hypercholesterolemia    On simvastatin.  Low cholesterol diet and exercise.  Follow lipid panel and liver function tests.        Relevant Orders   Hepatic function panel   Lipid panel   Hypertension    Blood pressure rechecked by me improved.  Follow pressures.  Follow metabolic panel.        Relevant Orders   Basic metabolic  panel   Pain in limb -  Primary   Relevant Orders   DG Foot 2 Views Left (Completed)   Restless leg syndrome    On gabapentin per neurology.        Rheumatoid arthritis ([facility])    Seeing Dr [name].  Stable.        Relevant Medications   hydroxychloroquine (PLAQUENIL) 200 MG tablet    Other Visit Diagnoses    Toe pain, left       Relevant Orders   DG Toe 4th Left (Completed)       [name], MD

## 2017-04-23 ENCOUNTER — Encounter: Payer: Self-pay | Admitting: Internal Medicine

## 2017-04-23 NOTE — Assessment & Plan Note (Signed)
On gabapentin per neurology.

## 2017-04-23 NOTE — Assessment & Plan Note (Signed)
Seeing Dr Kernodle.  Stable.   

## 2017-04-23 NOTE — Assessment & Plan Note (Signed)
On simvastatin.  Low cholesterol diet and exercise.  Follow lipid panel and liver function tests.   

## 2017-04-23 NOTE — Assessment & Plan Note (Signed)
Blood pressure rechecked by me improved.  Follow pressures.  Follow metabolic panel.

## 2017-04-23 NOTE — Assessment & Plan Note (Signed)
In SR.  Stable.

## 2017-04-23 NOTE — Assessment & Plan Note (Signed)
Tried to stop nexium.  Had return of symptoms.  Back on nexium now.  Symptoms controlled.

## 2017-04-25 ENCOUNTER — Telehealth: Payer: Self-pay | Admitting: Internal Medicine

## 2017-04-25 ENCOUNTER — Other Ambulatory Visit: Payer: Self-pay | Admitting: Internal Medicine

## 2017-04-25 DIAGNOSIS — M79672 Pain in left foot: Secondary | ICD-10-CM

## 2017-04-25 NOTE — Telephone Encounter (Signed)
Patient called wanting results of her foot xray/

## 2017-04-25 NOTE — Progress Notes (Signed)
Order placed for podiatry referral.   

## 2017-04-25 NOTE — Telephone Encounter (Signed)
See results note. 

## 2017-04-25 NOTE — Telephone Encounter (Signed)
See foot xray result note.  It looks like you were trying to get in touch with her.

## 2017-04-25 NOTE — Telephone Encounter (Signed)
FYI

## 2017-04-28 IMAGING — CR DG CHEST 2V
2 series · 2 of 2 positions shown · non-contrast
Comparison: Chest radiograph performed 07/30/2015

CLINICAL DATA: Acute onset of shortness of breath, dry cough,
generalized weakness and dizziness. Initial encounter.

EXAM:
CHEST  2 VIEW

[chest pa]
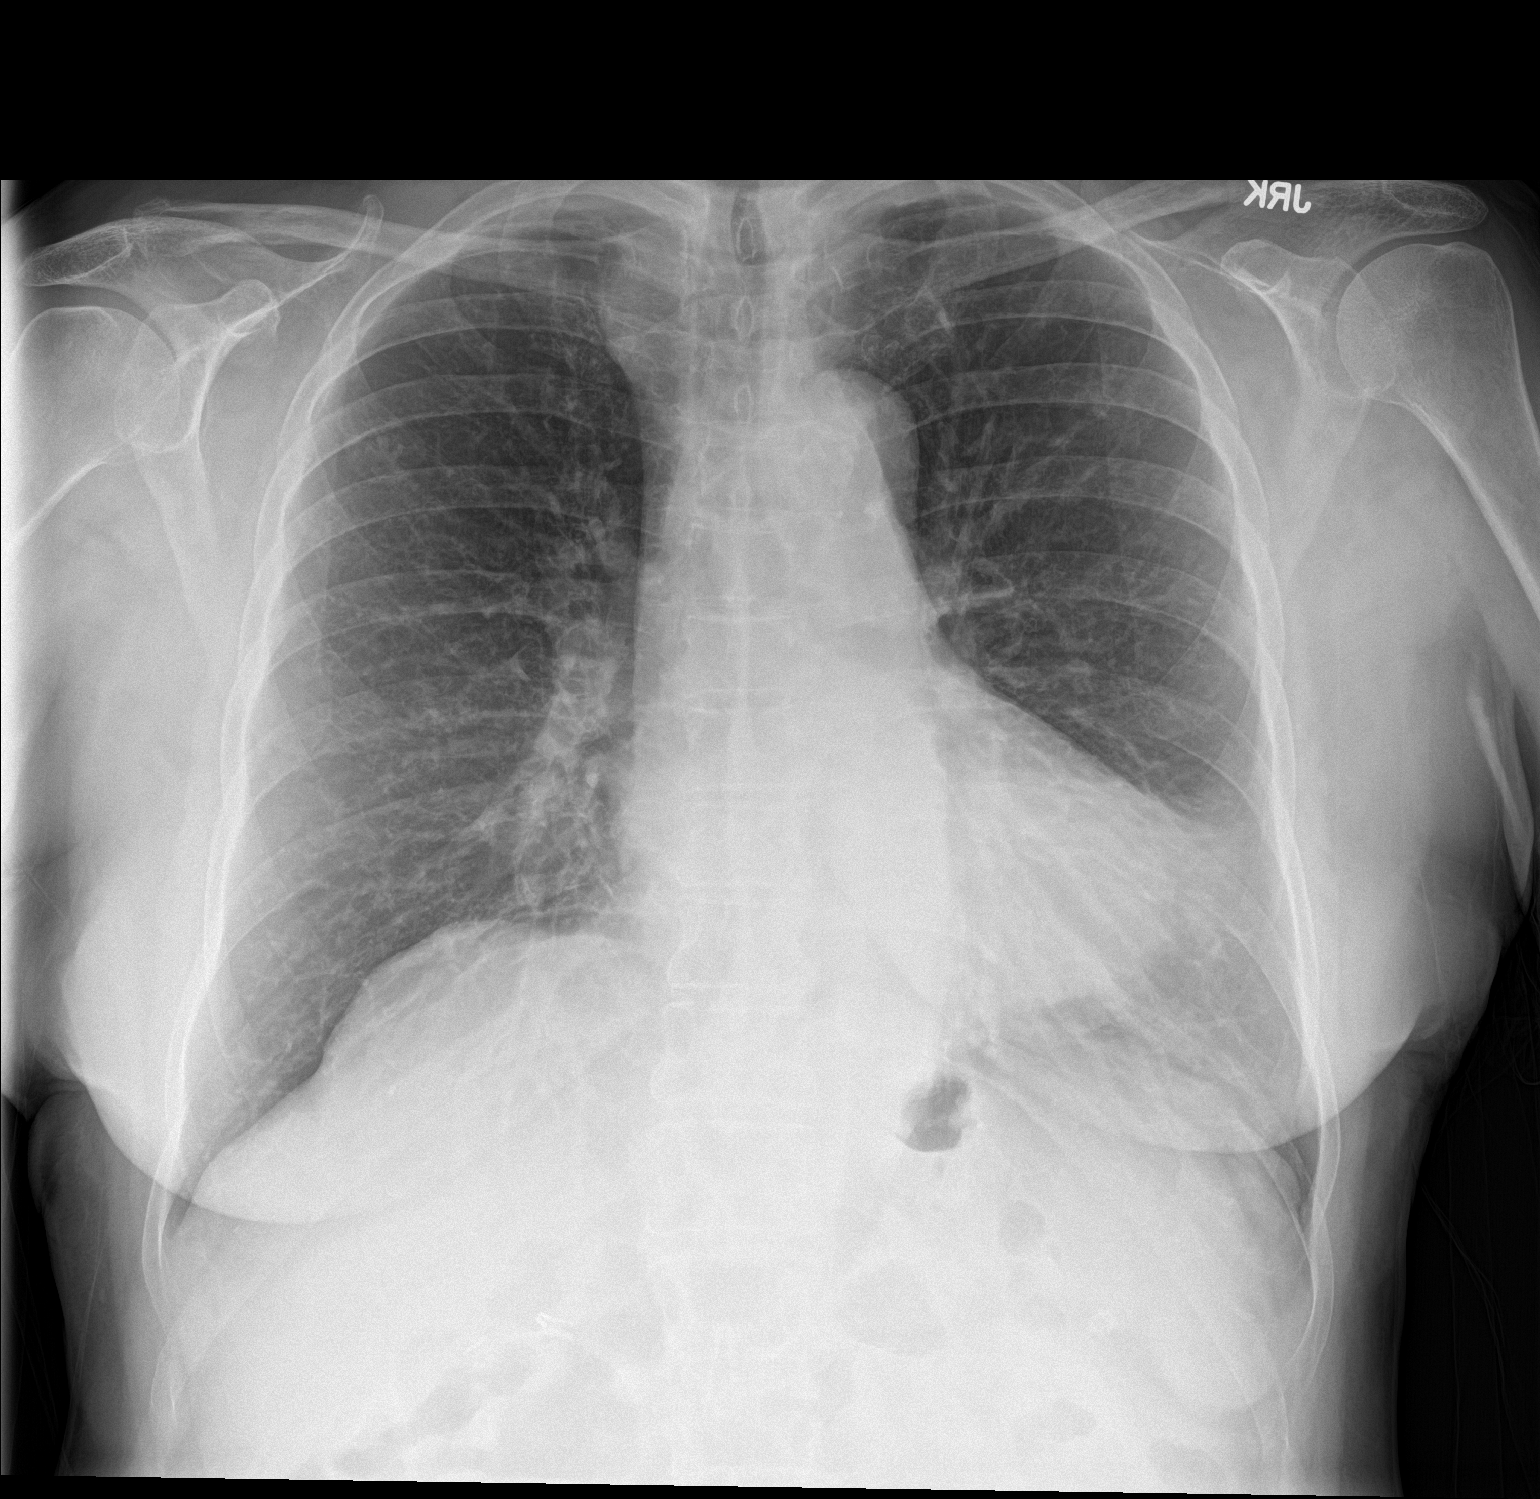

[chest lat]
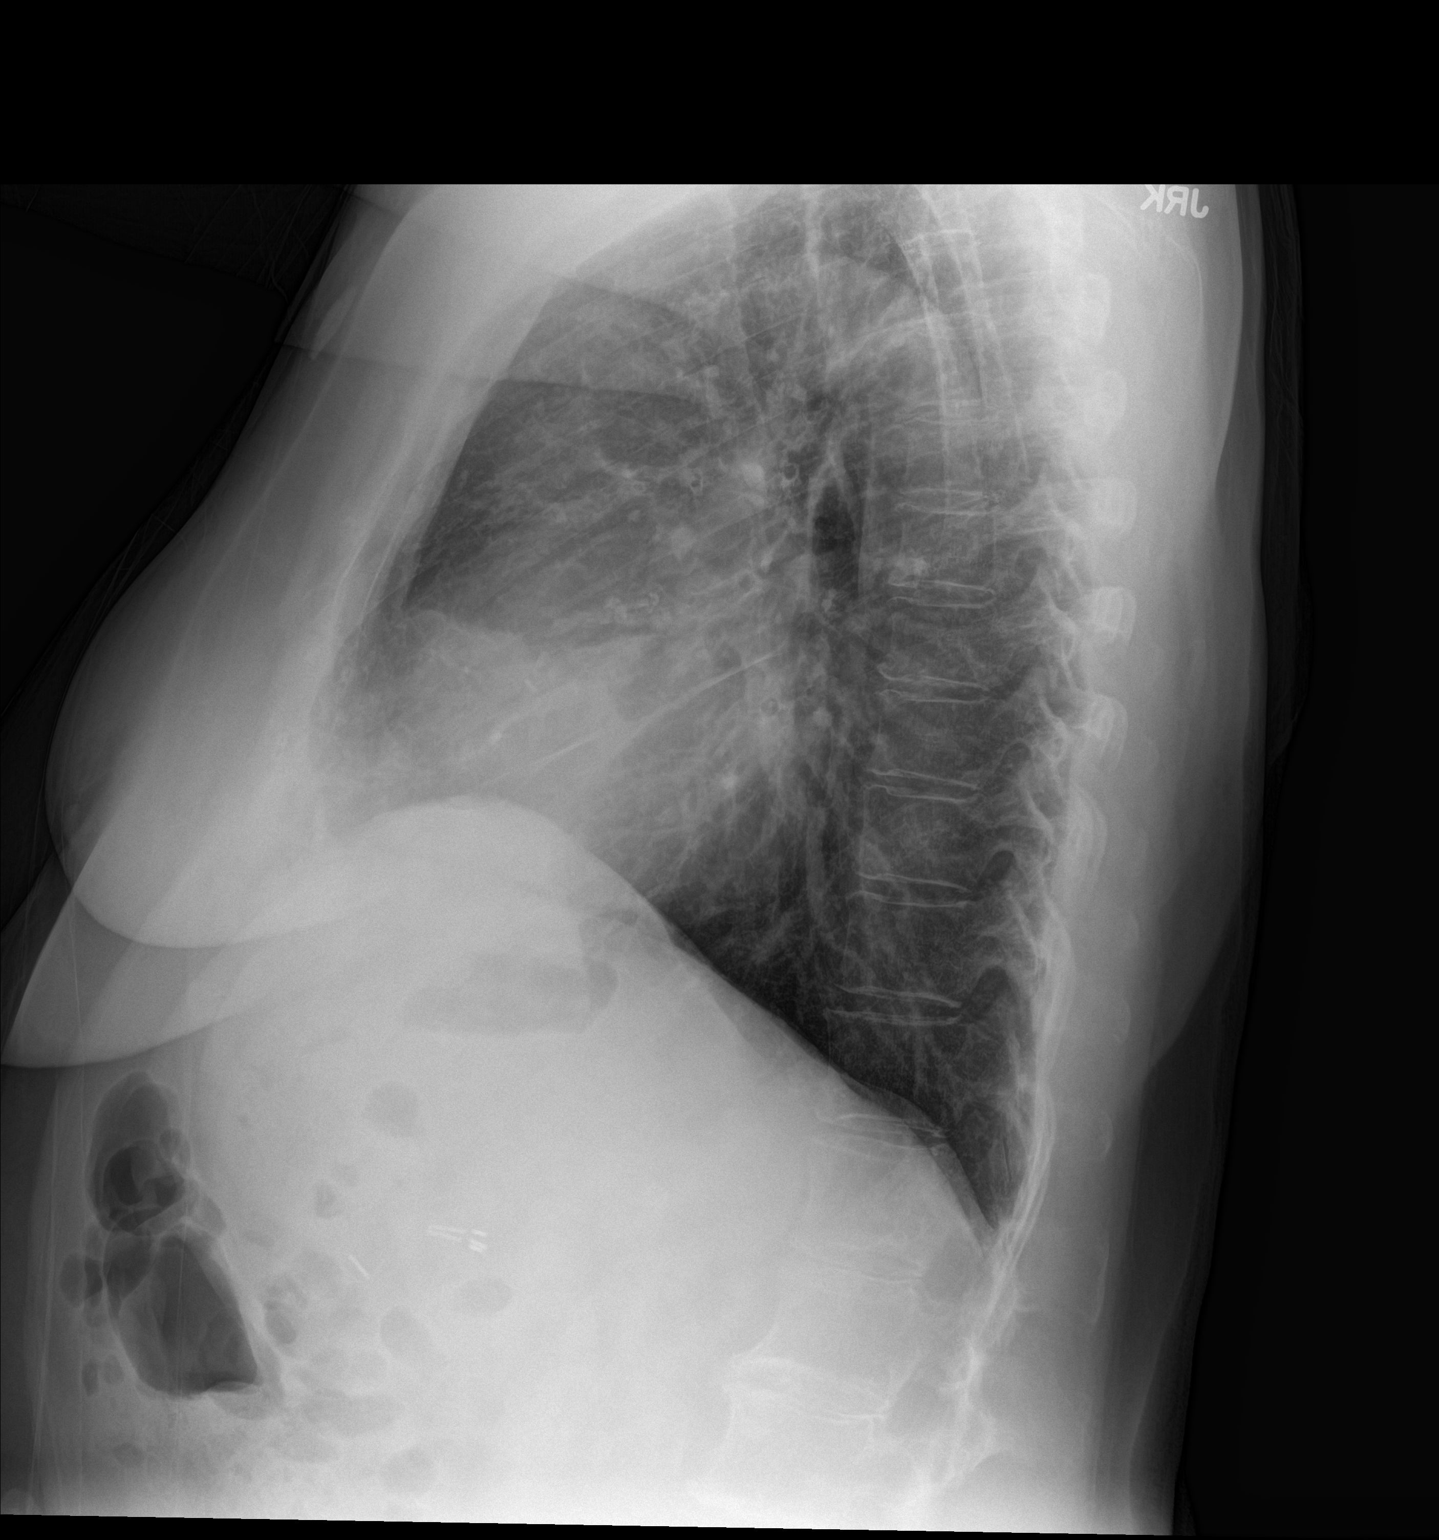

[2 of 2 positions shown; findings below may reference images not displayed]

FINDINGS: The lungs are well-aerated. Peribronchial thickening is noted. Mild
vascular congestion is seen. There is no evidence of focal
opacification, pleural effusion or pneumothorax.

The heart is borderline normal in size. Right paratracheal
prominence is stable and likely reflects normal vasculature. No
acute osseous abnormalities are seen. Clips are noted within the
right upper quadrant, reflecting prior cholecystectomy.
IMPRESSION: Peribronchial thickening noted.  Mild vascular congestion seen.

## 2017-04-29 ENCOUNTER — Other Ambulatory Visit: Payer: Self-pay | Admitting: Internal Medicine

## 2017-05-01 ENCOUNTER — Telehealth: Payer: Self-pay | Admitting: Internal Medicine

## 2017-05-01 NOTE — Telephone Encounter (Signed)
Pt called about needing a refill for metatoprol xl 50 mg. 34month supply.  Pharmacy is [place], [place]  Call pt @ [phone]. Thank you!

## 2017-05-02 MED ORDER — METOPROLOL SUCCINATE ER 50 MG PO TB24: 50.0000 mg | ORAL_TABLET | Freq: Every day | ORAL | 1 refills | Status: DC

## 2017-05-02 NOTE — Telephone Encounter (Signed)
Looks like this med was d/c at last o/v do you still want her to be on it?

## 2017-05-02 NOTE — Telephone Encounter (Signed)
rx sent in for toprol xl 50mg  q day #90 with one refill to [facility] - per pt request.

## 2017-05-02 NOTE — Telephone Encounter (Signed)
Pt confirmed taking metoprolol XL 50mg  1 tab daily. Was advised for her to stop after hospitalization but she decided to stay on and has continued taking since.

## 2017-05-02 NOTE — Telephone Encounter (Signed)
Need to confirm with pt if she has been taking and need to confirm if taking metoprolol XL 50mg  and how often taking.  Was this stopped after her last hospitalization.  Need to confirm.

## 2017-05-10 NOTE — Progress Notes (Signed)
    MRN : [id number]  Kaitlyn Huff is a 76 y.o. (1940/08/18) female who presents with chief complaint of No chief complaint on file. .   Procedure:  Sclerotherapy using hypertonic saline mixed with 1% Lidocaine was performed on lower extremities bilateral.  Compression wraps were placed.  The patient tolerated the procedure well.  Plan:  Follow up as arranged

## 2017-05-11 ENCOUNTER — Encounter (INDEPENDENT_AMBULATORY_CARE_PROVIDER_SITE_OTHER): Payer: Self-pay | Admitting: Vascular Surgery

## 2017-05-11 ENCOUNTER — Ambulatory Visit (INDEPENDENT_AMBULATORY_CARE_PROVIDER_SITE_OTHER): Payer: Medicare Other | Admitting: Vascular Surgery

## 2017-05-11 VITALS — BP 144/70 | HR 64 | Resp 16 | Ht 64.0 in | Wt 167.0 lb

## 2017-05-11 DIAGNOSIS — I8312 Varicose veins of left lower extremity with inflammation: Secondary | ICD-10-CM

## 2017-05-11 DIAGNOSIS — I8311 Varicose veins of right lower extremity with inflammation: Secondary | ICD-10-CM

## 2017-05-12 ENCOUNTER — Ambulatory Visit: Payer: Medicare Other

## 2017-05-22 DIAGNOSIS — M05771 Rheumatoid arthritis with rheumatoid factor of right ankle and foot without organ or systems involvement: Secondary | ICD-10-CM | POA: Diagnosis not present

## 2017-05-22 DIAGNOSIS — M7752 Other enthesopathy of left foot: Secondary | ICD-10-CM | POA: Diagnosis not present

## 2017-05-22 DIAGNOSIS — M7751 Other enthesopathy of right foot: Secondary | ICD-10-CM | POA: Diagnosis not present

## 2017-05-22 DIAGNOSIS — M05772 Rheumatoid arthritis with rheumatoid factor of left ankle and foot without organ or systems involvement: Secondary | ICD-10-CM | POA: Diagnosis not present

## 2017-05-24 ENCOUNTER — Other Ambulatory Visit: Payer: Self-pay | Admitting: Student

## 2017-05-24 DIAGNOSIS — K760 Fatty (change of) liver, not elsewhere classified: Secondary | ICD-10-CM | POA: Diagnosis not present

## 2017-05-24 DIAGNOSIS — Z961 Presence of intraocular lens: Secondary | ICD-10-CM | POA: Diagnosis not present

## 2017-05-24 DIAGNOSIS — K529 Noninfective gastroenteritis and colitis, unspecified: Secondary | ICD-10-CM | POA: Diagnosis not present

## 2017-05-24 DIAGNOSIS — K219 Gastro-esophageal reflux disease without esophagitis: Secondary | ICD-10-CM | POA: Diagnosis not present

## 2017-05-24 DIAGNOSIS — K2950 Unspecified chronic gastritis without bleeding: Secondary | ICD-10-CM | POA: Diagnosis not present

## 2017-05-30 DIAGNOSIS — M810 Age-related osteoporosis without current pathological fracture: Secondary | ICD-10-CM | POA: Diagnosis not present

## 2017-05-30 DIAGNOSIS — M0579 Rheumatoid arthritis with rheumatoid factor of multiple sites without organ or systems involvement: Secondary | ICD-10-CM | POA: Diagnosis not present

## 2017-06-01 ENCOUNTER — Ambulatory Visit
Admission: RE | Admit: 2017-06-01 | Discharge: 2017-06-01 | Disposition: A | Discharge status: Home / Self Care | Payer: Medicare Other | Source: Ambulatory Visit | Attending: Student | Admitting: Student

## 2017-06-06 ENCOUNTER — Ambulatory Visit: Payer: Medicare Other

## 2017-06-12 ENCOUNTER — Ambulatory Visit
Admission: RE | Admit: 2017-06-12 | Discharge: 2017-06-12 | Disposition: A | Discharge status: Home / Self Care | Payer: Medicare Other | Source: Ambulatory Visit | Attending: Student | Admitting: Student

## 2017-06-12 DIAGNOSIS — K760 Fatty (change of) liver, not elsewhere classified: Secondary | ICD-10-CM | POA: Diagnosis not present

## 2017-06-13 DIAGNOSIS — M7752 Other enthesopathy of left foot: Secondary | ICD-10-CM | POA: Diagnosis not present

## 2017-06-13 DIAGNOSIS — M7751 Other enthesopathy of right foot: Secondary | ICD-10-CM | POA: Diagnosis not present

## 2017-06-13 DIAGNOSIS — M05772 Rheumatoid arthritis with rheumatoid factor of left ankle and foot without organ or systems involvement: Secondary | ICD-10-CM | POA: Diagnosis not present

## 2017-06-13 DIAGNOSIS — M05771 Rheumatoid arthritis with rheumatoid factor of right ankle and foot without organ or systems involvement: Secondary | ICD-10-CM | POA: Diagnosis not present

## 2017-06-15 DIAGNOSIS — D649 Anemia, unspecified: Secondary | ICD-10-CM | POA: Diagnosis not present

## 2017-06-15 DIAGNOSIS — R197 Diarrhea, unspecified: Secondary | ICD-10-CM | POA: Diagnosis not present

## 2017-06-15 DIAGNOSIS — K625 Hemorrhage of anus and rectum: Secondary | ICD-10-CM | POA: Diagnosis not present

## 2017-06-15 LAB — IRON,TIBC AND FERRITIN PANEL: FERRITIN: 79

## 2017-06-15 LAB — BASIC METABOLIC PANEL: Glucose: 103

## 2017-06-23 ENCOUNTER — Encounter: Payer: Self-pay | Admitting: Emergency Medicine

## 2017-06-23 ENCOUNTER — Inpatient Hospital Stay
Admission: EM | Admit: 2017-06-23 | Discharge: 2017-06-26 | DRG: 392 | Disposition: A | Discharge status: Home / Self Care | Payer: Medicare Other | Attending: Internal Medicine | Admitting: Internal Medicine

## 2017-06-23 ENCOUNTER — Ambulatory Visit: Payer: Medicare Other

## 2017-06-23 ENCOUNTER — Other Ambulatory Visit: Payer: Self-pay

## 2017-06-23 DIAGNOSIS — E861 Hypovolemia: Secondary | ICD-10-CM | POA: Diagnosis present

## 2017-06-23 DIAGNOSIS — K529 Noninfective gastroenteritis and colitis, unspecified: Secondary | ICD-10-CM | POA: Diagnosis not present

## 2017-06-23 DIAGNOSIS — I480 Paroxysmal atrial fibrillation: Secondary | ICD-10-CM | POA: Diagnosis present

## 2017-06-23 DIAGNOSIS — E872 Acidosis, unspecified: Secondary | ICD-10-CM

## 2017-06-23 DIAGNOSIS — E785 Hyperlipidemia, unspecified: Secondary | ICD-10-CM | POA: Diagnosis present

## 2017-06-23 DIAGNOSIS — I248 Other forms of acute ischemic heart disease: Secondary | ICD-10-CM | POA: Diagnosis present

## 2017-06-23 DIAGNOSIS — R109 Unspecified abdominal pain: Secondary | ICD-10-CM | POA: Diagnosis not present

## 2017-06-23 DIAGNOSIS — Z801 Family history of malignant neoplasm of trachea, bronchus and lung: Secondary | ICD-10-CM

## 2017-06-23 DIAGNOSIS — R197 Diarrhea, unspecified: Secondary | ICD-10-CM | POA: Diagnosis not present

## 2017-06-23 DIAGNOSIS — I10 Essential (primary) hypertension: Secondary | ICD-10-CM | POA: Diagnosis present

## 2017-06-23 DIAGNOSIS — Z803 Family history of malignant neoplasm of breast: Secondary | ICD-10-CM

## 2017-06-23 DIAGNOSIS — R112 Nausea with vomiting, unspecified: Secondary | ICD-10-CM

## 2017-06-23 DIAGNOSIS — Z7952 Long term (current) use of systemic steroids: Secondary | ICD-10-CM

## 2017-06-23 DIAGNOSIS — Z8249 Family history of ischemic heart disease and other diseases of the circulatory system: Secondary | ICD-10-CM

## 2017-06-23 DIAGNOSIS — E860 Dehydration: Secondary | ICD-10-CM | POA: Diagnosis not present

## 2017-06-23 DIAGNOSIS — E876 Hypokalemia: Secondary | ICD-10-CM | POA: Diagnosis present

## 2017-06-23 DIAGNOSIS — I4891 Unspecified atrial fibrillation: Secondary | ICD-10-CM | POA: Diagnosis not present

## 2017-06-23 DIAGNOSIS — M069 Rheumatoid arthritis, unspecified: Secondary | ICD-10-CM | POA: Diagnosis present

## 2017-06-23 DIAGNOSIS — Z79899 Other long term (current) drug therapy: Secondary | ICD-10-CM

## 2017-06-23 DIAGNOSIS — Z7983 Long term (current) use of bisphosphonates: Secondary | ICD-10-CM

## 2017-06-23 DIAGNOSIS — K219 Gastro-esophageal reflux disease without esophagitis: Secondary | ICD-10-CM | POA: Diagnosis present

## 2017-06-23 DIAGNOSIS — Z7982 Long term (current) use of aspirin: Secondary | ICD-10-CM

## 2017-06-23 DIAGNOSIS — E871 Hypo-osmolality and hyponatremia: Secondary | ICD-10-CM | POA: Diagnosis present

## 2017-06-23 DIAGNOSIS — M810 Age-related osteoporosis without current pathological fracture: Secondary | ICD-10-CM | POA: Diagnosis present

## 2017-06-23 DIAGNOSIS — J45909 Unspecified asthma, uncomplicated: Secondary | ICD-10-CM | POA: Diagnosis present

## 2017-06-23 DIAGNOSIS — K760 Fatty (change of) liver, not elsewhere classified: Secondary | ICD-10-CM | POA: Diagnosis present

## 2017-06-23 MED ORDER — ONDANSETRON HCL 4 MG/2ML IJ SOLN
4.0000 mg | Freq: Once | INTRAMUSCULAR | Status: AC
  Administered 2017-06-23: 4 mg via INTRAVENOUS
  Filled 2017-06-23: qty 2

## 2017-06-23 NOTE — ED Triage Notes (Signed)
Pt called [facility] due to N/V/D since 1830 this evening that "was sudden". Pt has Zofran at home which she took approximately an hour ago and has not vomited since that time. Pt is in NAD.

## 2017-06-24 ENCOUNTER — Other Ambulatory Visit: Payer: Self-pay

## 2017-06-24 ENCOUNTER — Emergency Department: Payer: Medicare Other

## 2017-06-24 DIAGNOSIS — K529 Noninfective gastroenteritis and colitis, unspecified: Secondary | ICD-10-CM | POA: Diagnosis present

## 2017-06-24 DIAGNOSIS — Z801 Family history of malignant neoplasm of trachea, bronchus and lung: Secondary | ICD-10-CM | POA: Diagnosis not present

## 2017-06-24 DIAGNOSIS — Z79899 Other long term (current) drug therapy: Secondary | ICD-10-CM | POA: Diagnosis not present

## 2017-06-24 DIAGNOSIS — J45909 Unspecified asthma, uncomplicated: Secondary | ICD-10-CM | POA: Diagnosis present

## 2017-06-24 DIAGNOSIS — Z7982 Long term (current) use of aspirin: Secondary | ICD-10-CM | POA: Diagnosis not present

## 2017-06-24 DIAGNOSIS — E871 Hypo-osmolality and hyponatremia: Secondary | ICD-10-CM | POA: Diagnosis not present

## 2017-06-24 DIAGNOSIS — I248 Other forms of acute ischemic heart disease: Secondary | ICD-10-CM | POA: Diagnosis present

## 2017-06-24 DIAGNOSIS — I509 Heart failure, unspecified: Secondary | ICD-10-CM | POA: Diagnosis not present

## 2017-06-24 DIAGNOSIS — E872 Acidosis: Secondary | ICD-10-CM | POA: Diagnosis present

## 2017-06-24 DIAGNOSIS — Z7983 Long term (current) use of bisphosphonates: Secondary | ICD-10-CM | POA: Diagnosis not present

## 2017-06-24 DIAGNOSIS — I10 Essential (primary) hypertension: Secondary | ICD-10-CM | POA: Diagnosis present

## 2017-06-24 DIAGNOSIS — K219 Gastro-esophageal reflux disease without esophagitis: Secondary | ICD-10-CM | POA: Diagnosis present

## 2017-06-24 DIAGNOSIS — R109 Unspecified abdominal pain: Secondary | ICD-10-CM | POA: Diagnosis not present

## 2017-06-24 DIAGNOSIS — E860 Dehydration: Secondary | ICD-10-CM | POA: Diagnosis present

## 2017-06-24 DIAGNOSIS — E861 Hypovolemia: Secondary | ICD-10-CM | POA: Diagnosis present

## 2017-06-24 DIAGNOSIS — Z8249 Family history of ischemic heart disease and other diseases of the circulatory system: Secondary | ICD-10-CM | POA: Diagnosis not present

## 2017-06-24 DIAGNOSIS — M069 Rheumatoid arthritis, unspecified: Secondary | ICD-10-CM | POA: Diagnosis present

## 2017-06-24 DIAGNOSIS — R748 Abnormal levels of other serum enzymes: Secondary | ICD-10-CM | POA: Diagnosis not present

## 2017-06-24 DIAGNOSIS — I480 Paroxysmal atrial fibrillation: Secondary | ICD-10-CM | POA: Diagnosis not present

## 2017-06-24 DIAGNOSIS — Z803 Family history of malignant neoplasm of breast: Secondary | ICD-10-CM | POA: Diagnosis not present

## 2017-06-24 DIAGNOSIS — I4891 Unspecified atrial fibrillation: Secondary | ICD-10-CM | POA: Diagnosis not present

## 2017-06-24 DIAGNOSIS — M810 Age-related osteoporosis without current pathological fracture: Secondary | ICD-10-CM | POA: Diagnosis present

## 2017-06-24 DIAGNOSIS — K760 Fatty (change of) liver, not elsewhere classified: Secondary | ICD-10-CM | POA: Diagnosis present

## 2017-06-24 DIAGNOSIS — E876 Hypokalemia: Secondary | ICD-10-CM | POA: Diagnosis present

## 2017-06-24 DIAGNOSIS — Z7952 Long term (current) use of systemic steroids: Secondary | ICD-10-CM | POA: Diagnosis not present

## 2017-06-24 DIAGNOSIS — E785 Hyperlipidemia, unspecified: Secondary | ICD-10-CM | POA: Diagnosis present

## 2017-06-24 LAB — CBC
HEMATOCRIT: 34.6 % — AB (ref 35.0–47.0)
HEMOGLOBIN: 11.8 g/dL — AB (ref 12.0–16.0)
MCH: 32 pg (ref 26.0–34.0)
MCHC: 34 g/dL (ref 32.0–36.0)
MCV: 94.1 fL (ref 80.0–100.0)
Platelets: 223 10*3/uL (ref 150–440)
RBC: 3.68 MIL/uL — AB (ref 3.80–5.20)
RDW: 13.8 % (ref 11.5–14.5)
WBC: 8.1 10*3/uL (ref 3.6–11.0)

## 2017-06-24 LAB — COMPREHENSIVE METABOLIC PANEL
ALT: 25 U/L (ref 14–54)
ANION GAP: 16 — AB (ref 5–15)
AST: 49 U/L — ABNORMAL HIGH (ref 15–41)
Albumin: 4.4 g/dL (ref 3.5–5.0)
Alkaline Phosphatase: 44 U/L (ref 38–126)
BILIRUBIN TOTAL: 1.3 mg/dL — AB (ref 0.3–1.2)
BUN: 10 mg/dL (ref 6–20)
CHLORIDE: 95 mmol/L — AB (ref 101–111)
CO2: 20 mmol/L — ABNORMAL LOW (ref 22–32)
Calcium: 9.1 mg/dL (ref 8.9–10.3)
Creatinine, Ser: 0.8 mg/dL (ref 0.44–1.00)
Glucose, Bld: 115 mg/dL — ABNORMAL HIGH (ref 65–99)
POTASSIUM: 3.7 mmol/L (ref 3.5–5.1)
Sodium: 131 mmol/L — ABNORMAL LOW (ref 135–145)
TOTAL PROTEIN: 7.8 g/dL (ref 6.5–8.1)

## 2017-06-24 LAB — TROPONIN I
TROPONIN I: 0.09 ng/mL — AB (ref <0.03)
Troponin I: 0.05 ng/mL (ref <0.03)
Troponin I: 0.09 ng/mL (ref <0.03)
Troponin I: 0.1 ng/mL (ref <0.03)
Troponin I: 0.09 ng/mL (ref <0.03)
Troponin I: 0.08 ng/mL (ref <0.03)

## 2017-06-24 LAB — URINALYSIS, COMPLETE (UACMP) WITH MICROSCOPIC
BACTERIA UA: NONE SEEN
BILIRUBIN URINE: NEGATIVE
Glucose, UA: NEGATIVE mg/dL
Hgb urine dipstick: NEGATIVE
KETONES UR: 5 mg/dL — AB
LEUKOCYTES UA: NEGATIVE
NITRITE: NEGATIVE
PH: 8 (ref 5.0–8.0)
PROTEIN: 30 mg/dL — AB
Specific Gravity, Urine: 1.004 — ABNORMAL LOW (ref 1.005–1.030)
Squamous Epithelial / LPF: NONE SEEN

## 2017-06-24 LAB — TSH: TSH: 0.914 u[IU]/mL (ref 0.350–4.500)

## 2017-06-24 LAB — LACTIC ACID, PLASMA
LACTIC ACID, VENOUS: 2.5 mmol/L — AB (ref 0.5–1.9)
Lactic Acid, Venous: 2.1 mmol/L (ref 0.5–1.9)

## 2017-06-24 LAB — LIPASE, BLOOD: LIPASE: 47 U/L (ref 11–51)

## 2017-06-24 MED ORDER — POTASSIUM CHLORIDE CRYS ER 20 MEQ PO TBCR
20.0000 meq | EXTENDED_RELEASE_TABLET | Freq: Every day | ORAL | Status: DC
  Administered 2017-06-24 – 2017-06-26 (×3): 20 meq via ORAL
  Filled 2017-06-24 (×3): qty 1

## 2017-06-24 MED ORDER — ALPRAZOLAM 0.25 MG PO TABS
0.2500 mg | ORAL_TABLET | Freq: Every day | ORAL | Status: DC | PRN
  Administered 2017-06-24 – 2017-06-25 (×2): 0.25 mg via ORAL
  Filled 2017-06-24 (×2): qty 1

## 2017-06-24 MED ORDER — METOPROLOL SUCCINATE ER 50 MG PO TB24
50.0000 mg | ORAL_TABLET | Freq: Every day | ORAL | Status: DC
  Administered 2017-06-25: 50 mg via ORAL
  Filled 2017-06-24 (×3): qty 1

## 2017-06-24 MED ORDER — CALCIUM CARBONATE-VITAMIN D 500-200 MG-UNIT PO TABS
1.0000 | ORAL_TABLET | Freq: Two times a day (BID) | ORAL | Status: DC
  Administered 2017-06-24 – 2017-06-26 (×5): 1 via ORAL
  Filled 2017-06-24 (×5): qty 1

## 2017-06-24 MED ORDER — FUROSEMIDE 40 MG PO TABS: 40.0000 mg | ORAL_TABLET | Freq: Every day | ORAL | Status: DC | PRN

## 2017-06-24 MED ORDER — CIPROFLOXACIN IN D5W 400 MG/200ML IV SOLN
400.0000 mg | Freq: Two times a day (BID) | INTRAVENOUS | Status: DC
  Administered 2017-06-24 – 2017-06-26 (×5): 400 mg via INTRAVENOUS
  Filled 2017-06-24 (×7): qty 200

## 2017-06-24 MED ORDER — PROMETHAZINE HCL 25 MG/ML IJ SOLN
12.5000 mg | Freq: Four times a day (QID) | INTRAMUSCULAR | Status: DC | PRN
  Administered 2017-06-24: 12.5 mg via INTRAVENOUS
  Filled 2017-06-24: qty 1

## 2017-06-24 MED ORDER — ACETAMINOPHEN 650 MG RE SUPP: 650.0000 mg | Freq: Four times a day (QID) | RECTAL | Status: DC | PRN

## 2017-06-24 MED ORDER — ASPIRIN EC 81 MG PO TBEC
81.0000 mg | DELAYED_RELEASE_TABLET | Freq: Every day | ORAL | Status: DC
  Administered 2017-06-24 – 2017-06-26 (×3): 81 mg via ORAL
  Filled 2017-06-24 (×3): qty 1

## 2017-06-24 MED ORDER — TIOTROPIUM BROMIDE MONOHYDRATE 18 MCG IN CAPS
1.0000 | ORAL_CAPSULE | Freq: Every day | RESPIRATORY_TRACT | Status: DC
  Administered 2017-06-24 – 2017-06-26 (×3): 18 ug via RESPIRATORY_TRACT
  Filled 2017-06-24: qty 5

## 2017-06-24 MED ORDER — IOPAMIDOL (ISOVUE-300) INJECTION 61%
100.0000 mL | Freq: Once | INTRAVENOUS | Status: AC | PRN
  Administered 2017-06-24: 100 mL via INTRAVENOUS

## 2017-06-24 MED ORDER — ONDANSETRON HCL 4 MG/2ML IJ SOLN: 4.0000 mg | Freq: Four times a day (QID) | INTRAMUSCULAR | Status: DC | PRN

## 2017-06-24 MED ORDER — MOMETASONE FURO-FORMOTEROL FUM 200-5 MCG/ACT IN AERO
2.0000 | INHALATION_SPRAY | Freq: Two times a day (BID) | RESPIRATORY_TRACT | Status: DC
  Administered 2017-06-24 – 2017-06-26 (×5): 2 via RESPIRATORY_TRACT
  Filled 2017-06-24: qty 8.8

## 2017-06-24 MED ORDER — GABAPENTIN 300 MG PO CAPS
300.0000 mg | ORAL_CAPSULE | Freq: Four times a day (QID) | ORAL | Status: DC
  Administered 2017-06-24 – 2017-06-26 (×10): 300 mg via ORAL
  Filled 2017-06-24 (×10): qty 1

## 2017-06-24 MED ORDER — HYDROXYCHLOROQUINE SULFATE 200 MG PO TABS
200.0000 mg | ORAL_TABLET | Freq: Two times a day (BID) | ORAL | Status: DC
  Administered 2017-06-24 – 2017-06-26 (×5): 200 mg via ORAL
  Filled 2017-06-24 (×7): qty 1

## 2017-06-24 MED ORDER — SODIUM CHLORIDE 0.9 % IV BOLUS (SEPSIS)
1000.0000 mL | Freq: Once | INTRAVENOUS | Status: AC
  Administered 2017-06-24: 1000 mL via INTRAVENOUS

## 2017-06-24 MED ORDER — COLESTIPOL HCL 1 G PO TABS
2.0000 g | ORAL_TABLET | Freq: Every day | ORAL | Status: DC
  Administered 2017-06-24 – 2017-06-26 (×3): 2 g via ORAL
  Filled 2017-06-24 (×3): qty 2

## 2017-06-24 MED ORDER — SALINE SPRAY 0.65 % NA SOLN
1.0000 | NASAL | Status: DC | PRN
  Filled 2017-06-24: qty 44

## 2017-06-24 MED ORDER — ENOXAPARIN SODIUM 40 MG/0.4ML ~~LOC~~ SOLN
40.0000 mg | SUBCUTANEOUS | Status: DC
  Administered 2017-06-24 – 2017-06-25 (×2): 40 mg via SUBCUTANEOUS
  Filled 2017-06-24 (×2): qty 0.4

## 2017-06-24 MED ORDER — SODIUM CHLORIDE 0.9 % IV SOLN
INTRAVENOUS | Status: DC
  Administered 2017-06-24 – 2017-06-26 (×5): via INTRAVENOUS

## 2017-06-24 MED ORDER — RISAQUAD PO CAPS
1.0000 | ORAL_CAPSULE | Freq: Every day | ORAL | Status: DC
  Administered 2017-06-24 – 2017-06-26 (×3): 1 via ORAL
  Filled 2017-06-24 (×3): qty 1

## 2017-06-24 MED ORDER — PANTOPRAZOLE SODIUM 40 MG PO TBEC
40.0000 mg | DELAYED_RELEASE_TABLET | Freq: Every day | ORAL | Status: DC
  Administered 2017-06-24 – 2017-06-26 (×3): 40 mg via ORAL
  Filled 2017-06-24 (×3): qty 1

## 2017-06-24 MED ORDER — MECLIZINE HCL 25 MG PO TABS
25.0000 mg | ORAL_TABLET | Freq: Three times a day (TID) | ORAL | Status: DC | PRN
  Filled 2017-06-24: qty 1

## 2017-06-24 MED ORDER — TRAMADOL HCL 50 MG PO TABS
50.0000 mg | ORAL_TABLET | Freq: Two times a day (BID) | ORAL | Status: DC | PRN
  Administered 2017-06-24 – 2017-06-26 (×3): 50 mg via ORAL
  Filled 2017-06-24 (×3): qty 1

## 2017-06-24 MED ORDER — VITAMIN D 1000 UNITS PO TABS
1000.0000 [IU] | ORAL_TABLET | Freq: Every day | ORAL | Status: DC
  Administered 2017-06-24 – 2017-06-26 (×3): 1000 [IU] via ORAL
  Filled 2017-06-24 (×3): qty 1

## 2017-06-24 MED ORDER — CEPASTAT 14.5 MG MT LOZG
1.0000 | LOZENGE | OROMUCOSAL | Status: DC | PRN
  Administered 2017-06-24 – 2017-06-26 (×4): 1 via BUCCAL
  Filled 2017-06-24 (×3): qty 9

## 2017-06-24 MED ORDER — GI COCKTAIL ~~LOC~~
30.0000 mL | Freq: Once | ORAL | Status: AC
  Administered 2017-06-24: 30 mL via ORAL
  Filled 2017-06-24: qty 30

## 2017-06-24 MED ORDER — ACETAMINOPHEN 325 MG PO TABS: 650.0000 mg | ORAL_TABLET | Freq: Four times a day (QID) | ORAL | Status: DC | PRN

## 2017-06-24 MED ORDER — FLUTICASONE PROPIONATE 50 MCG/ACT NA SUSP
2.0000 | Freq: Every day | NASAL | Status: DC
  Administered 2017-06-24 – 2017-06-26 (×3): 2 via NASAL
  Filled 2017-06-24: qty 16

## 2017-06-24 MED ORDER — FAMOTIDINE 20 MG PO TABS
40.0000 mg | ORAL_TABLET | Freq: Once | ORAL | Status: AC
  Administered 2017-06-24: 40 mg via ORAL
  Filled 2017-06-24: qty 2

## 2017-06-24 MED ORDER — ONDANSETRON HCL 4 MG PO TABS: 4.0000 mg | ORAL_TABLET | Freq: Four times a day (QID) | ORAL | Status: DC | PRN

## 2017-06-24 MED ORDER — SODIUM CHLORIDE 0.9 % IV SOLN
8.0000 mg | Freq: Once | INTRAVENOUS | Status: AC
  Administered 2017-06-24: 8 mg via INTRAVENOUS
  Filled 2017-06-24: qty 2

## 2017-06-24 MED ORDER — ADULT MULTIVITAMIN W/MINERALS CH
1.0000 | ORAL_TABLET | Freq: Every day | ORAL | Status: DC
  Administered 2017-06-24 – 2017-06-26 (×3): 1 via ORAL
  Filled 2017-06-24 (×3): qty 1

## 2017-06-24 MED ORDER — METRONIDAZOLE IN NACL 5-0.79 MG/ML-% IV SOLN
500.0000 mg | Freq: Once | INTRAVENOUS | Status: AC
  Administered 2017-06-24: 500 mg via INTRAVENOUS
  Filled 2017-06-24: qty 100

## 2017-06-24 MED ORDER — CEFTRIAXONE SODIUM IN DEXTROSE 20 MG/ML IV SOLN
1.0000 g | Freq: Once | INTRAVENOUS | Status: AC
  Administered 2017-06-24: 1 g via INTRAVENOUS
  Filled 2017-06-24: qty 50

## 2017-06-24 MED ORDER — SIMVASTATIN 20 MG PO TABS
20.0000 mg | ORAL_TABLET | Freq: Every day | ORAL | Status: DC
  Administered 2017-06-24 – 2017-06-26 (×3): 20 mg via ORAL
  Filled 2017-06-24 (×3): qty 1

## 2017-06-24 MED ORDER — PREDNISONE 10 MG PO TABS
5.0000 mg | ORAL_TABLET | Freq: Every day | ORAL | Status: DC
  Administered 2017-06-24 – 2017-06-26 (×3): 5 mg via ORAL
  Filled 2017-06-24 (×3): qty 1

## 2017-06-24 MED ORDER — LORATADINE 10 MG PO TABS
10.0000 mg | ORAL_TABLET | Freq: Every day | ORAL | Status: DC
Start: 2017-06-24 — End: 2017-06-26
  Administered 2017-06-24 – 2017-06-26 (×3): 10 mg via ORAL
  Filled 2017-06-24 (×3): qty 1

## 2017-06-24 MED ORDER — DOCUSATE SODIUM 100 MG PO CAPS
100.0000 mg | ORAL_CAPSULE | Freq: Two times a day (BID) | ORAL | Status: DC
  Administered 2017-06-24 – 2017-06-26 (×5): 100 mg via ORAL
  Filled 2017-06-24 (×5): qty 1

## 2017-06-24 NOTE — Progress Notes (Signed)
[facility] [place] NAME: Kaitlyn Huff    MR#:  [id number]  DATE OF BIRTH:  [birth year]  SUBJECTIVE:  CHIEF COMPLAINT: Patient is feeling better.  Nausea improved.  Tolerating liquids.  Denies any abdominal pain.  Son at bedside.  REVIEW OF SYSTEMS:  CONSTITUTIONAL: No fever, fatigue or weakness.  EYES: No blurred or double vision.  EARS, NOSE, AND THROAT: No tinnitus or ear pain.  RESPIRATORY: No cough, shortness of breath, wheezing or hemoptysis.  CARDIOVASCULAR: No chest pain, orthopnea, edema.  GASTROINTESTINAL: No nausea, vomiting, diarrhea or abdominal pain.  GENITOURINARY: No dysuria, hematuria.  ENDOCRINE: No polyuria, nocturia,  HEMATOLOGY: No anemia, easy bruising or bleeding SKIN: No rash or lesion. MUSCULOSKELETAL: No joint pain or arthritis.   NEUROLOGIC: No tingling, numbness, weakness.  PSYCHIATRY: No anxiety or depression.   DRUG ALLERGIES:   Allergies  Allergen Reactions  . Astelin [Azelastine Hcl] Other (See Comments)    Reaction:  Unknown   . Codeine Other (See Comments)    Reaction:  Altered mental status  . Dilaudid [Hydromorphone] Other (See Comments)    Reaction:  Unknown   . Flexeril [Cyclobenzaprine] Other (See Comments)    Reaction:  Unknown   . Imuran [Azathioprine] Other (See Comments)    Reaction:  Abnormal liver function  . Lisinopril Itching  . Lyrica [Pregabalin] Other (See Comments)    Reaction:  Sore gums   . Methotrexate Derivatives Other (See Comments)    Reaction:  Abnormal liver function  . Amoxicillin Rash and Other (See Comments)    Unable to obtain enough information to answer additional questions about this medication.    Jolee Ewing [Leflunomide] Rash  . Clindamycin/Lincomycin Rash  . Doxycycline Rash  . Lodine [Etodolac] Rash  . Percocet [Oxycodone-Acetaminophen] Rash  . Sulfa Antibiotics Rash    VITALS:  Blood pressure (!) 123/45, pulse 62, temperature 98.8 F (37.1 C),  temperature source Oral, resp. rate 16, height 5\' 4"  (1.626 m), weight 72.1 kg (159 lb), SpO2 97 %.  PHYSICAL EXAMINATION:  GENERAL:  76 y.o.-year-old patient lying in the bed with no acute distress.  EYES: Pupils equal, round, reactive to light and accommodation. No scleral icterus. Extraocular muscles intact.  HEENT: Head atraumatic, normocephalic. Oropharynx and nasopharynx clear.  NECK:  Supple, no jugular venous distention. No thyroid enlargement, no tenderness.  LUNGS: Normal breath sounds bilaterally, no wheezing, rales,rhonchi or crepitation. No use of accessory muscles of respiration.  CARDIOVASCULAR: S1, S2 normal. No murmurs, rubs, or gallops.  ABDOMEN: Soft, nontender, nondistended. Bowel sounds present. No organomegaly or mass.  EXTREMITIES: No pedal edema, cyanosis, or clubbing.  NEUROLOGIC: Cranial nerves II through XII are intact. Muscle strength 5/5 in all extremities. Sensation intact. Gait not checked.  PSYCHIATRIC: The patient is alert and oriented x 3.  SKIN: No obvious rash, lesion, or ulcer.    LABORATORY PANEL:   CBC Recent Labs  Lab 06/24/17 0006  WBC 8.1  HGB 11.8*  HCT 34.6*  PLT 223   ------------------------------------------------------------------------------------------------------------------  Chemistries  Recent Labs  Lab 06/24/17 0006  NA 131*  K 3.7  CL 95*  CO2 20*  GLUCOSE 115*  BUN 10  CREATININE 0.80  CALCIUM 9.1  AST 49*  ALT 25  ALKPHOS 44  BILITOT 1.3*   ------------------------------------------------------------------------------------------------------------------  Cardiac Enzymes Recent Labs  Lab 06/24/17 1847  TROPONINI 0.09*   ------------------------------------------------------------------------------------------------------------------  RADIOLOGY:  Ct Abdomen Pelvis W Contrast  Result Date: 06/24/2017 CLINICAL DATA:  Sudden onset of abdominal pain. EXAM: CT ABDOMEN AND PELVIS WITH CONTRAST TECHNIQUE:  Multidetector CT imaging of the abdomen and pelvis was performed using the standard protocol following bolus administration of intravenous contrast. CONTRAST:  119mL ISOVUE-300 IOPAMIDOL (ISOVUE-300) INJECTION 61% COMPARISON:  Abdominal ultrasound 06/12/2017.  CT 06/19/2016 FINDINGS: Lower chest: Mild cardiomegaly. No pleural fluid or consolidation. Moderate hiatal hernia with minimal fluid in the intrathoracic stomach. Hepatobiliary: Decreased hepatic density consistent with steatosis. Postcholecystectomy with clips in the gallbladder fossa. Stable dilatation of the common bile duct post cholecystectomy measuring 10 mm distally. Pancreas: No ductal dilatation or inflammation. 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 Spleen is normal in size. Adrenals/Urinary Tract: Normal adrenal glands. No hydronephrosis. Homogeneous renal enhancement with symmetric excretion on delayed phase imaging. Symmetric mild bilateral perinephric edema is unchanged from prior exam. Urinary bladder is distended without wall thickening. Stomach/Bowel: Moderate hiatal hernia. Small amount of fluid in the intrathoracic and subdiaphragmatic stomach mild distal gastric wall thickening. No small bowel obstruction or inflammation. Majority of the colon is decompressed. Possible wall thickening involving the descending and proximal sigmoid colon. Descending and sigmoid diverticulosis without acute diverticulitis. Normal appendix. Vascular/Lymphatic: Scattered aortic atherosclerosis. No aneurysm. Prominent right inguinal nodes measuring up to 10 mm, likely reactive. No enlarged abdominal lymph nodes. Reproductive: Status post hysterectomy. No adnexal masses. Other: No free air, free fluid, or intra-abdominal fluid collection. Small fat containing umbilical hernia. Musculoskeletal: Bones are under mineralized with multilevel degenerative change in the spine. IMPRESSION: 1. Equivocal colonic wall  thickening of the descending and proximal sigmoid colon versus nondistention. Possible mild colitis. 2. Moderate hiatal hernia with some intraluminal fluid, as well as mild distal gastric wall thickening, nonspecific but can be seen with gastritis. 3. Hepatic steatosis. Chronic biliary prominence postcholecystectomy. Electronically Signed   By: Jeb Levering M.D.   On: 06/24/2017 03:20    EKG:   Orders placed or performed during the hospital encounter of 06/23/17  . ED EKG  . ED EKG  . EKG 12-Lead  . EKG 12-Lead    ASSESSMENT AND PLAN:    This is a 76 year old female admitted for colitis.  1.  -Acute colitis; continue abdomen is nonsurgical; no blood in stool.  Ciprofloxacin and Flagyl  advance diet as tolerated  Lactic acid is up indicating dehydration.  Lactic acid 2.5-2.1 Hydrate with intravenous fluid.  Encourage p.o. intake.  Advance diet as tolerated.    2.  Elevated troponin: Likely secondary to demand ischemia due to recurrent vomiting.   Monitor telemetry.   Cardiac biomarkers are not trending  patient is a symptomatic from cardiac standpoint  3.  Hypertension: Controlled; continue metoprolol  4.  Hyponatremia: Secondary to hypovolemia.  Encourage p.o. intake as well as hydrate with intravenous fluid.  5.  Rheumatoid arthritis: Continue Plaquenil  6.  Asthma: Controlled; continue inhaled corticosteroid.  Albuterol as needed.  7.  Hyperlipidemia: Continue statin therapy  8.  GI prophylaxis: Pantoprazole   9.  DVT prophylaxis: Lovenox      All the records are reviewed and case discussed with Care Management/Social Workerr. Management plans discussed with the patient, son at bedside and they are in agreement.  CODE STATUS: Full code  TOTAL TIME TAKING CARE OF THIS PATIENT:35 minutes.   POSSIBLE D/C IN 2 DAYS, DEPENDING ON CLINICAL CONDITION.  Note: This dictation was prepared with Dragon dictation along with smaller phrase technology. Any transcriptional  errors that result from this process are unintentional.   Alvar Malinoski,  Tomma Ehinger M.D on 06/24/2017 at 7:59 PM  Between 7am to 6pm - Pager - [phone] After 6pm go to [email or web address] - password [email or web address] [facility]  Office  [phone]  CC: Primary care physician; [name], MD

## 2017-06-24 NOTE — Progress Notes (Addendum)
Troponin came back elevated at 0.09. Dr. Margaretmary Eddy notified via text page system.   Update: Dr. Margaretmary Eddy called back and no new orders received.

## 2017-06-24 NOTE — ED Notes (Signed)
Pt placed on bedpan

## 2017-06-24 NOTE — ED Notes (Signed)
Rebecca RN, aware of bed assigned 

## 2017-06-24 NOTE — ED Notes (Signed)
Pt ambulatory to BR with NAD.

## 2017-06-24 NOTE — Progress Notes (Addendum)
Troponin came back elevated at 0.10 Dr. Margaretmary Eddy notified via text page system. She updated orders in epic.

## 2017-06-24 NOTE — Progress Notes (Signed)
Pt's troponin came back elevated at 0.09 (previous value was 0.05). Dr. Margaretmary Eddy notified stated they will continue to trend troponin levels, no new orders received.

## 2017-06-24 NOTE — ED Provider Notes (Signed)
[facility] Emergency Department Provider Note  ____________________________________________   First MD Initiated Contact with Patient 06/24/17 0005     (approximate)  I have reviewed the triage vital signs and the nursing notes.   HISTORY  Chief Complaint Emesis    HPI Kaitlyn Huff is a 76 y.o. female comes to the emergency department with sudden onset moderate to severe nausea and vomiting that began earlier this afternoon.  She has had multiple similar episodes over the past year or so and is not been given a clear diagnosis.  She denies vertigo although she does feel somewhat lightheaded.  She has no pain.  Her symptoms seem to be worse when eating and somewhat improved after taking Zofran at home.  Past Medical History:  Diagnosis Date  . Anemia   . Cancer ([place])   . Diverticulitis   . GERD (gastroesophageal reflux disease)   . Hyperlipidemia   . Hypertension   . Osteoarthritis   . Osteoporosis    actonel  . Positive PPD    s/p INH (2006)  . Rheumatoid arthritis(714.0)    MTX transaminitis, Leflunomide (rash), enbrel, plaquinil, prednisone, remicade, Imuran (transaminitis)  . Valvular heart disease    moderate MR and TR    Patient Active Problem List   Diagnosis Date Noted  . Dizziness 10/01/2016  . A-fib ([place]) 07/12/2016  . Pneumonia 06/17/2016  . 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 Chronic venous insufficiency 04/25/2016  . Pain in limb 04/25/2016  . Swelling of limb 04/25/2016  . Tachycardia 03/22/2016  . Foot fracture 03/18/2016  . Cough 01/27/2016  . Hypoxia 12/20/2015  . Lower extremity edema 12/18/2015  . Diarrhea 09/15/2015  . CAP (community acquired pneumonia) 08/30/2015  . Restless leg syndrome 08/07/2015  . History of colonic polyps 07/10/2015  . Health care maintenance 01/11/2015  . Weight loss 08/04/2013  . Obstructive sleep apnea 12/23/2012  . Dilated bile duct 12/23/2012  .  Hypercholesterolemia 08/12/2012  . Rheumatoid arthritis ([place]) 08/12/2012  . GERD (gastroesophageal reflux disease) 08/12/2012  . Hypertension 08/12/2012  . Anemia 08/12/2012    Past Surgical History:  Procedure Laterality Date  . ABDOMINAL HYSTERECTOMY    . CERVICAL CONE BIOPSY    . CHOLECYSTECTOMY  06/22/14  . COLONOSCOPY WITH PROPOFOL N/A 07/09/2015   Procedure: COLONOSCOPY WITH PROPOFOL;  Surgeon: Manya Silvas, MD;  Location: [facility] ENDOSCOPY;  Service: Endoscopy;  Laterality: N/A;  . ESOPHAGOGASTRODUODENOSCOPY (EGD) WITH PROPOFOL N/A 07/09/2015   Procedure: ESOPHAGOGASTRODUODENOSCOPY (EGD) WITH PROPOFOL;  Surgeon: Manya Silvas, MD;  Location: [facility] ENDOSCOPY;  Service: Endoscopy;  Laterality: N/A;  . OVARY SURGERY    . TRACHEOSTOMY  1959  . TUBAL LIGATION      Prior to Admission medications   Medication Sig Start Date End Date Taking? Authorizing Provider  acidophilus (RISAQUAD) CAPS capsule Take 1 capsule by mouth daily.   Yes [provider]  ADVAIR DISKUS 250-50 MCG/DOSE AEPB USE 1 INHALATION EVERY 12 HOURS 05/01/17  Yes [name], MD  albuterol (PROAIR HFA) 108 (90 Base) MCG/ACT inhaler USE 2 INHALATIONS EVERY 6 HOURS AS NEEDED FOR WHEEZING OR SHORTNESS OF BREATH 10/19/16  Yes [name], MD  alendronate (FOSAMAX) 70 MG tablet Take 70 mg by mouth once a week.  09/04/15  Yes [provider]  ALPRAZolam (XANAX) 0.25 MG tablet Take 1 tablet (0.25 mg total) by mouth daily as needed for anxiety. 04/13/17  Yes [name], MD  aspirin EC  81 MG tablet Take 81 mg by mouth daily.   Yes [provider]  Calcium Carbonate-Vitamin D (CALCIUM 600+D) 600-400 MG-UNIT tablet Take 1 tablet by mouth 2 (two) times daily.    Yes [provider]  cetirizine (ZYRTEC) 10 MG tablet TAKE 1 TABLET DAILY 12/16/16  Yes [name], MD  cholecalciferol (VITAMIN D) 1000 units tablet Take 1,000 Units by mouth daily.   Yes [provider]    colestipol (COLESTID) 1 g tablet Take 2 g by mouth daily.    Yes [provider]  esomeprazole (NEXIUM) 20 MG capsule Take 20 mg by mouth daily at 12 noon.   Yes [provider]  FeFum-FePo-FA-B Cmp-C-Zn-Mn-Cu (TANDEM PLUS) 162-115.2-1 MG CAPS TAKE 1 CAPSULE DAILY 04/10/17  Yes [name], MD  fluticasone (FLONASE) 50 MCG/ACT nasal spray Place 2 sprays into both nostrils daily. Patient taking differently: Place 2 sprays into both nostrils 2 (two) times daily.  01/27/16  Yes Leone Haven, MD  furosemide (LASIX) 20 MG tablet Take 2 tablets (40 mg total) by mouth daily. Patient taking differently: Take 40 mg by mouth daily as needed for fluid or edema.  12/23/16  Yes Mody, Ulice Bold, MD  gabapentin (NEURONTIN) 300 MG capsule Take 300 mg by mouth 4 (four) times daily.    Yes [provider]  hydroxychloroquine (PLAQUENIL) 200 MG tablet Take 200 mg by mouth 2 (two) times daily.  03/27/17  Yes [provider]  meclizine (ANTIVERT) 25 MG tablet Take 1 tablet (25 mg total) by mouth 3 (three) times daily as needed for dizziness. 08/09/16  Yes Nance Pear, MD  metoprolol succinate (TOPROL XL) 50 MG 24 hr tablet Take 1 tablet (50 mg total) by mouth daily. Take with or immediately following a meal. 05/02/17  Yes [name], MD  Multiple Vitamin (MULTIVITAMIN WITH MINERALS) TABS tablet Take 1 tablet by mouth daily.   Yes [provider]  ondansetron (ZOFRAN ODT) 4 MG disintegrating tablet Take 1 tablet (4 mg total) by mouth every 8 (eight) hours as needed for nausea or vomiting. 11/19/16  Yes Lavonia Drafts, MD  potassium chloride (KLOR-CON SPRINKLE) 10 MEQ CR capsule TAKE 2 CAPSULES Once a day 12/23/16  Yes Mody, Sital, MD  predniSONE (DELTASONE) 5 MG tablet Take 1 tablet (5 mg total) by mouth daily with breakfast. 12/23/16  Yes Mody, Sital, MD  simvastatin (ZOCOR) 20 MG tablet TAKE 1 TABLET DAILY 01/23/17  Yes [name], MD  sodium chloride (OCEAN)  0.65 % nasal spray Place 1 spray into the nose as needed for congestion.   Yes [provider]  SPIRIVA HANDIHALER 18 MCG inhalation capsule INHALE THE CONTENTS OF 1 CAPSULE DAILY 01/23/17  Yes [name], MD  traMADol (ULTRAM) 50 MG tablet Take 50 mg by mouth 2 (two) times daily as needed for moderate pain.   Yes [provider]  Diphenhyd-Hydrocort-Nystatin (FIRST-DUKES MOUTHWASH) SUSP Pt is to swish and spit 5 cc's tid Patient not taking: Reported on 06/24/2017 02/09/17   [name], MD  guaiFENesin-dextromethorphan (ROBITUSSIN DM) 100-10 MG/5ML syrup Take 5 mLs by mouth every 4 (four) hours as needed for cough. Patient not taking: Reported on 06/24/2017 08/07/15   Theodoro Grist, MD  lip balm (BLISTEX) OINT Apply 1 application topically as needed for lip care. Patient not taking: Reported on 06/24/2017 06/21/16   Loletha Grayer, MD    Allergies Astelin Lennie Muckle hcl]; Codeine; Dilaudid [hydromorphone]; Flexeril [cyclobenzaprine]; Imuran [azathioprine]; Lisinopril; Lyrica [pregabalin]; Methotrexate derivatives; Amoxicillin; Arava [leflunomide];  Clindamycin/lincomycin; Doxycycline; Lodine [etodolac]; Percocet [oxycodone-acetaminophen]; and Sulfa antibiotics  Family History  Problem Relation Age of Onset  . Heart disease Father        MI  . Heart disease Mother   . Valvular heart disease Mother   . Breast cancer Sister 59  . Cancer Sister        Lung cancer  . Colon cancer Neg Hx     Social History Social History   Tobacco Use  . Smoking status: Never Smoker  . Smokeless tobacco: Never Used  Substance Use Topics  . Alcohol use: No    Alcohol/week: 0.0 oz  . Drug use: No    Review of Systems Constitutional: No fever/chills Eyes: No visual changes. ENT: No sore throat. Cardiovascular: Denies chest pain. Respiratory: Denies shortness of breath. Gastrointestinal: No abdominal pain.  Positive for nausea, positive for vomiting.  No diarrhea.  No  constipation. Genitourinary: Negative for dysuria. Musculoskeletal: Negative for back pain. Skin: Negative for rash. Neurological: Negative for headaches, focal weakness or numbness.   ____________________________________________   PHYSICAL EXAM:  VITAL SIGNS: ED Triage Vitals  Enc Vitals Group     BP 06/23/17 2349 (!) 164/109     Pulse Rate 06/23/17 2349 (!) 111     Resp 06/23/17 2349 (!) 23     Temp 06/23/17 2349 97.9 F (36.6 C)     Temp Source 06/23/17 2349 Oral     SpO2 06/23/17 2349 99 %     Weight 06/23/17 2350 167 lb (75.8 kg)     Height 06/23/17 2350 5\' 4"  (1.626 m)     Head Circumference --      Peak Flow --      Pain Score 06/23/17 2349 5     Pain Loc --      Pain Edu? --      Excl. in [place]? --     Constitutional: Alert and oriented x4 appears uncomfortable closing her eyes no diaphoresis speaks in full clear sentences Eyes: PERRL EOMI. no nystagmus Head: Atraumatic. Nose: No congestion/rhinnorhea. Mouth/Throat: No trismus Neck: No stridor.   Cardiovascular: Tachycardic rate, regular rhythm. Grossly normal heart sounds.  Good peripheral circulation. Respiratory: Normal respiratory effort.  No retractions. Lungs CTAB and moving good air Gastrointestinal: Soft nondistended nontender no rebound or guarding no peritonitis Musculoskeletal: No lower extremity edema   Neurologic:  Normal speech and language. No gross focal neurologic deficits are appreciated. No pronator drift and normal finger-nose-finger Skin: Mild diaphoresis. Psychiatric: Mood and affect are normal. Speech and behavior are normal.    ____________________________________________   DIFFERENTIAL includes but not limited to  Central vertigo, peripheral vertigo, gastroenteritis, diverticulitis, dehydration ____________________________________________   LABS (all labs ordered are listed, but only abnormal results are displayed)  Labs Reviewed  COMPREHENSIVE METABOLIC PANEL - Abnormal;  Notable for the following components:      Result Value   Sodium 131 (*)    Chloride 95 (*)    CO2 20 (*)    Glucose, Bld 115 (*)    AST 49 (*)    Total Bilirubin 1.3 (*)    Anion gap 16 (*)    All other components within normal limits  CBC - Abnormal; Notable for the following components:   RBC 3.68 (*)    Hemoglobin 11.8 (*)    HCT 34.6 (*)    All other components within normal limits  URINALYSIS, COMPLETE (UACMP) WITH MICROSCOPIC - Abnormal; Notable for the following components:   Color, Urine  COLORLESS (*)    APPearance CLEAR (*)    Specific Gravity, Urine 1.004 (*)    Ketones, ur 5 (*)    Protein, ur 30 (*)    All other components within normal limits  TROPONIN I - Abnormal; Notable for the following components:   Troponin I 0.05 (*)    All other components within normal limits  LACTIC ACID, PLASMA - Abnormal; Notable for the following components:   Lactic Acid, Venous 2.5 (*)    All other components within normal limits  CULTURE, BLOOD (ROUTINE X 2)  CULTURE, BLOOD (ROUTINE X 2)  LIPASE, BLOOD  LACTIC ACID, PLASMA    Blood work reviewed by me shows elevated lactic acid with anion gap acidosis __________________________________________  EKG  ED ECG REPORT I, Darel Hong, the attending physician, personally viewed and interpreted this ECG.  Date: 06/24/2017 EKG Time:  Rate: 101 Rhythm: Atrial fibrillation QRS Axis: normal Intervals: normal ST/T Wave abnormalities: normal Narrative Interpretation: no evidence of acute ischemia  ____________________________________________  RADIOLOGY  CT abdomen pelvis reviewed by me appears to show colitis ____________________________________________   PROCEDURES  Procedure(s) performed: no  .Critical Care Performed by: Darel Hong, MD Authorized by: Darel Hong, MD   Critical care provider statement:    Critical care time (minutes):  30   Critical care time was exclusive of:  Separately billable  procedures and treating other patients   Critical care was necessary to treat or prevent imminent or life-threatening deterioration of the following conditions:  Sepsis and circulatory failure    Critical Care performed: yes  Observation: no ____________________________________________   INITIAL IMPRESSION / ASSESSMENT AND PLAN / ED COURSE  Pertinent labs & imaging results that were available during my care of the patient were reviewed by me and considered in my medical decision making (see chart for details).  Patient is tachycardic diaphoretic and extremely uncomfortable appearing with significant nausea.  She has no vertigo.  Differential is broad but we will treat her symptomatically and reevaluate.     ----------------------------------------- 1:36 AM on 06/24/2017 -----------------------------------------  Patient symptoms are not improved and she actually appears quite miserable at this point.  Blood work is reassuring and I do not have a clear etiology of her symptoms.  While she has no abdominal pain she is older having abdominal symptoms so I do believe she requires a CT scan abdomen pelvis.  Endoscopy done in 2016 shows severe gastritis we will treat her for that as well. ____________________________________________   ----------------------------------------- 3:32 AM on 06/24/2017  CT abdomen pelvis shows likely colitis.  Patient remains quite symptomatic and dehydrated.  She does have an elevated lactic acid.  At this point I will treat her with continued antiemetics, IV fluids, and intravenous antibiotics.  She does require inpatient admission for further resuscitation.  I discussed with the patient who verbalized understanding and agreement the plan.  I then discussed with the hospitalist Dr. Marcille Blanco who is graciously agreed to admit the patient to his service. -----------------------------------------   FINAL CLINICAL IMPRESSION(S) / ED DIAGNOSES  Final diagnoses:    Colitis  Intractable vomiting with nausea, unspecified vomiting type  Lactic acidosis      NEW MEDICATIONS STARTED DURING THIS VISIT:  This SmartLink is deprecated. Use AVSMEDLIST instead to display the medication list for a patient.   Note:  This document was prepared using Dragon voice recognition software and may include unintentional dictation errors.     Darel Hong, MD 06/24/17 [phone]

## 2017-06-24 NOTE — Progress Notes (Signed)
Pt's troponin came back at 0.09. Dr. Doy Hutching notified, no new orders received.

## 2017-06-24 NOTE — H&P (Addendum)
Kaitlyn Huff is an 76 y.o. female.   Chief Complaint: Vomiting HPI: The patient with past medical history of rheumatoid arthritis, hypertension, hyperlipidemia, valvular heart disease and diverticulitis presents to the emergency department complaining of vomiting and abdominal pain.  The patient states that she has had diarrhea for the last 4 days but began to have acute vomiting abdominal pain this evening.  She denies blood in her stool or emesis.  CT of her abdomen showed possible mild colitis.  The patient was started on Flagyl and given intravenous fluid.  Laboratory evaluation also showed elevated troponin.  The patient has denied chest pain or shortness of breath.  Once the patient was more comfortable the emergency department staff calling the hospitalist service for admission.  Past Medical History:  Diagnosis Date  . Anemia   . Cancer ([place])   . Diverticulitis   . GERD (gastroesophageal reflux disease)   . Hyperlipidemia   . Hypertension   . Osteoarthritis   . Osteoporosis    actonel  . Positive PPD    s/p INH (2006)  . Rheumatoid arthritis(714.0)    MTX transaminitis, Leflunomide (rash), enbrel, plaquinil, prednisone, remicade, Imuran (transaminitis)  . Valvular heart disease    moderate MR and TR    Past Surgical History:  Procedure Laterality Date  . ABDOMINAL HYSTERECTOMY    . CERVICAL CONE BIOPSY    . CHOLECYSTECTOMY  06/22/14  . COLONOSCOPY WITH PROPOFOL N/A 07/09/2015   Procedure: COLONOSCOPY WITH PROPOFOL;  Surgeon: Manya Silvas, MD;  Location: [facility] ENDOSCOPY;  Service: Endoscopy;  Laterality: N/A;  . ESOPHAGOGASTRODUODENOSCOPY (EGD) WITH PROPOFOL N/A 07/09/2015   Procedure: ESOPHAGOGASTRODUODENOSCOPY (EGD) WITH PROPOFOL;  Surgeon: Manya Silvas, MD;  Location: [facility] ENDOSCOPY;  Service: Endoscopy;  Laterality: N/A;  . OVARY SURGERY    . TRACHEOSTOMY  1959  . TUBAL LIGATION      Family History  Problem Relation Age of Onset  . Heart disease Father         MI  . Heart disease Mother   . Valvular heart disease Mother   . Breast cancer Sister 74  . Cancer Sister        Lung cancer  . Colon cancer Neg Hx    Social History:  reports that  has never smoked. she has never used smokeless tobacco. She reports that she does not drink alcohol or use drugs.  Allergies:  Allergies  Allergen Reactions  . Astelin [Azelastine Hcl] Other (See Comments)    Reaction:  Unknown   . Codeine Other (See Comments)    Reaction:  Altered mental status  . Dilaudid [Hydromorphone] Other (See Comments)    Reaction:  Unknown   . Flexeril [Cyclobenzaprine] Other (See Comments)    Reaction:  Unknown   . Imuran [Azathioprine] Other (See Comments)    Reaction:  Abnormal liver function  . Lisinopril Itching  . Lyrica [Pregabalin] Other (See Comments)    Reaction:  Sore gums   . Methotrexate Derivatives Other (See Comments)    Reaction:  Abnormal liver function  . Amoxicillin Rash and Other (See Comments)    Unable to obtain enough information to answer additional questions about this medication.    Jolee Ewing [Leflunomide] Rash  . Clindamycin/Lincomycin Rash  . Doxycycline Rash  . Lodine [Etodolac] Rash  . Percocet [Oxycodone-Acetaminophen] Rash  . Sulfa Antibiotics Rash    Medications Prior to Admission  Medication Sig Dispense Refill  . acidophilus (RISAQUAD) CAPS capsule Take 1 capsule by mouth  daily.    Marland Kitchen ADVAIR DISKUS 250-50 MCG/DOSE AEPB USE 1 INHALATION EVERY 12 HOURS 180 each 0  . albuterol (PROAIR HFA) 108 (90 Base) MCG/ACT inhaler USE 2 INHALATIONS EVERY 6 HOURS AS NEEDED FOR WHEEZING OR SHORTNESS OF BREATH 54 g 1  . alendronate (FOSAMAX) 70 MG tablet Take 70 mg by mouth once a week.     . ALPRAZolam (XANAX) 0.25 MG tablet Take 1 tablet (0.25 mg total) by mouth daily as needed for anxiety. 90 tablet 0  . aspirin EC 81 MG tablet Take 81 mg by mouth daily.    . Calcium Carbonate-Vitamin D (CALCIUM 600+D) 600-400 MG-UNIT tablet Take 1 tablet by mouth  2 (two) times daily.     . cetirizine (ZYRTEC) 10 MG tablet TAKE 1 TABLET DAILY 90 tablet 1  . cholecalciferol (VITAMIN D) 1000 units tablet Take 1,000 Units by mouth daily.    . colestipol (COLESTID) 1 g tablet Take 2 g by mouth daily.     Marland Kitchen esomeprazole (NEXIUM) 20 MG capsule Take 20 mg by mouth daily at 12 noon.    Marland Kitchen FeFum-FePo-FA-B Cmp-C-Zn-Mn-Cu (TANDEM PLUS) 162-115.2-1 MG CAPS TAKE 1 CAPSULE DAILY 90 each 0  . fluticasone (FLONASE) 50 MCG/ACT nasal spray Place 2 sprays into both nostrils daily. (Patient taking differently: Place 2 sprays into both nostrils 2 (two) times daily. ) 16 g 6  . furosemide (LASIX) 20 MG tablet Take 2 tablets (40 mg total) by mouth daily. (Patient taking differently: Take 40 mg by mouth daily as needed for fluid or edema. )    . gabapentin (NEURONTIN) 300 MG capsule Take 300 mg by mouth 4 (four) times daily.     . hydroxychloroquine (PLAQUENIL) 200 MG tablet Take 200 mg by mouth 2 (two) times daily.     . meclizine (ANTIVERT) 25 MG tablet Take 1 tablet (25 mg total) by mouth 3 (three) times daily as needed for dizziness. 15 tablet 0  . metoprolol succinate (TOPROL XL) 50 MG 24 hr tablet Take 1 tablet (50 mg total) by mouth daily. Take with or immediately following a meal. 90 tablet 1  . Multiple Vitamin (MULTIVITAMIN WITH MINERALS) TABS tablet Take 1 tablet by mouth daily.    . ondansetron (ZOFRAN ODT) 4 MG disintegrating tablet Take 1 tablet (4 mg total) by mouth every 8 (eight) hours as needed for nausea or vomiting. 20 tablet 0  . potassium chloride (KLOR-CON SPRINKLE) 10 MEQ CR capsule TAKE 2 CAPSULES Once a day 360 capsule 3  . predniSONE (DELTASONE) 5 MG tablet Take 1 tablet (5 mg total) by mouth daily with breakfast.    . simvastatin (ZOCOR) 20 MG tablet TAKE 1 TABLET DAILY 90 tablet 1  . sodium chloride (OCEAN) 0.65 % nasal spray Place 1 spray into the nose as needed for congestion.    Marland Kitchen SPIRIVA HANDIHALER 18 MCG inhalation capsule INHALE THE CONTENTS OF 1  CAPSULE DAILY 90 capsule 1  . traMADol (ULTRAM) 50 MG tablet Take 50 mg by mouth 2 (two) times daily as needed for moderate pain.    . Diphenhyd-Hydrocort-Nystatin (FIRST-DUKES MOUTHWASH) SUSP Pt is to swish and spit 5 cc's tid (Patient not taking: Reported on 06/24/2017) 237 mL 0  . guaiFENesin-dextromethorphan (ROBITUSSIN DM) 100-10 MG/5ML syrup Take 5 mLs by mouth every 4 (four) hours as needed for cough. (Patient not taking: Reported on 06/24/2017) 118 mL 0  . lip balm (BLISTEX) OINT Apply 1 application topically as needed for lip care. (Patient not taking:  Reported on 06/24/2017) 1 Tube 0    Results for orders placed or performed during the hospital encounter of 06/23/17 (from the past 48 hour(s))  Lipase, blood     Status: None   Collection Time: 06/24/17 12:06 AM  Result Value Ref Range   Lipase 47 11 - 51 U/L  Comprehensive metabolic panel     Status: Abnormal   Collection Time: 06/24/17 12:06 AM  Result Value Ref Range   Sodium 131 (L) 135 - 145 mmol/L   Potassium 3.7 3.5 - 5.1 mmol/L   Chloride 95 (L) 101 - 111 mmol/L   CO2 20 (L) 22 - 32 mmol/L   Glucose, Bld 115 (H) 65 - 99 mg/dL   BUN 10 6 - 20 mg/dL   Creatinine, Ser 0.80 0.44 - 1.00 mg/dL   Calcium 9.1 8.9 - 10.3 mg/dL   Total Protein 7.8 6.5 - 8.1 g/dL   Albumin 4.4 3.5 - 5.0 g/dL   AST 49 (H) 15 - 41 U/L   ALT 25 14 - 54 U/L   Alkaline Phosphatase 44 38 - 126 U/L   Total Bilirubin 1.3 (H) 0.3 - 1.2 mg/dL   GFR calc non Af Amer >60 >60 mL/min   GFR calc Af Amer >60 >60 mL/min    Comment: (NOTE) The eGFR has been calculated using the CKD EPI equation. This calculation has not been validated in all clinical situations. eGFR's persistently <60 mL/min signify possible Chronic Kidney Disease.    Anion gap 16 (H) 5 - 15  CBC     Status: Abnormal   Collection Time: 06/24/17 12:06 AM  Result Value Ref Range   WBC 8.1 3.6 - 11.0 K/uL   RBC 3.68 (L) 3.80 - 5.20 MIL/uL   Hemoglobin 11.8 (L) 12.0 - 16.0 g/dL   HCT 34.6  (L) 35.0 - 47.0 %   MCV 94.1 80.0 - 100.0 fL   MCH 32.0 26.0 - 34.0 pg   MCHC 34.0 32.0 - 36.0 g/dL   RDW 13.8 11.5 - 14.5 %   Platelets 223 150 - 440 K/uL  Urinalysis, Complete w Microscopic     Status: Abnormal   Collection Time: 06/24/17 12:06 AM  Result Value Ref Range   Color, Urine COLORLESS (A) YELLOW   APPearance CLEAR (A) CLEAR   Specific Gravity, Urine 1.004 (L) 1.005 - 1.030   pH 8.0 5.0 - 8.0   Glucose, UA NEGATIVE NEGATIVE mg/dL   Hgb urine dipstick NEGATIVE NEGATIVE   Bilirubin Urine NEGATIVE NEGATIVE   Ketones, ur 5 (A) NEGATIVE mg/dL   Protein, ur 30 (A) NEGATIVE mg/dL   Nitrite NEGATIVE NEGATIVE   Leukocytes, UA NEGATIVE NEGATIVE   RBC / HPF 0-5 0 - 5 RBC/hpf   WBC, UA 0-5 0 - 5 WBC/hpf   Bacteria, UA NONE SEEN NONE SEEN   Squamous Epithelial / LPF NONE SEEN NONE SEEN  Troponin I     Status: Abnormal   Collection Time: 06/24/17  1:43 AM  Result Value Ref Range   Troponin I 0.05 (HH) <0.03 ng/mL    Comment: CRITICAL RESULT CALLED TO, READ BACK BY AND VERIFIED WITH REBECCA LYNN AT 7858 06/24/17.PMH  Lactic acid, plasma     Status: Abnormal   Collection Time: 06/24/17  1:43 AM  Result Value Ref Range   Lactic Acid, Venous 2.5 (HH) 0.5 - 1.9 mmol/L    Comment: CRITICAL RESULT CALLED TO, READ BACK BY AND VERIFIED WITH REBECCA LYNN AT 8502 06/24/17.PMH  Lactic  acid, plasma     Status: Abnormal   Collection Time: 06/24/17  2:59 AM  Result Value Ref Range   Lactic Acid, Venous 2.1 (HH) 0.5 - 1.9 mmol/L    Comment: CRITICAL RESULT CALLED TO, READ BACK BY AND VERIFIED WITH REBECCA LYNN AT 7371 06/24/17.PMH   Ct Abdomen Pelvis W Contrast  Result Date: 06/24/2017 CLINICAL DATA:  Sudden onset of abdominal pain. EXAM: CT ABDOMEN AND PELVIS WITH CONTRAST TECHNIQUE: Multidetector CT imaging of the abdomen and pelvis was performed using the standard protocol following bolus administration of intravenous contrast. CONTRAST:  147m ISOVUE-300 IOPAMIDOL (ISOVUE-300) INJECTION  61% COMPARISON:  Abdominal ultrasound 06/12/2017.  CT 06/19/2016 FINDINGS: Lower chest: Mild cardiomegaly. No pleural fluid or consolidation. Moderate hiatal hernia with minimal fluid in the intrathoracic stomach. Hepatobiliary: Decreased hepatic density consistent with steatosis. Postcholecystectomy with clips in the gallbladder fossa. Stable dilatation of the common bile duct post cholecystectomy measuring 10 mm distally. Pancreas: No ductal dilatation or inflammation. 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 Spleen is normal in size. Adrenals/Urinary Tract: Normal adrenal glands. No hydronephrosis. Homogeneous renal enhancement with symmetric excretion on delayed phase imaging. Symmetric mild bilateral perinephric edema is unchanged from prior exam. Urinary bladder is distended without wall thickening. Stomach/Bowel: Moderate hiatal hernia. Small amount of fluid in the intrathoracic and subdiaphragmatic stomach mild distal gastric wall thickening. No small bowel obstruction or inflammation. Majority of the colon is decompressed. Possible wall thickening involving the descending and proximal sigmoid colon. Descending and sigmoid diverticulosis without acute diverticulitis. Normal appendix. Vascular/Lymphatic: Scattered aortic atherosclerosis. No aneurysm. Prominent right inguinal nodes measuring up to 10 mm, likely reactive. No enlarged abdominal lymph nodes. Reproductive: Status post hysterectomy. No adnexal masses. Other: No free air, free fluid, or intra-abdominal fluid collection. Small fat containing umbilical hernia. Musculoskeletal: Bones are under mineralized with multilevel degenerative change in the spine. IMPRESSION: 1. Equivocal colonic wall thickening of the descending and proximal sigmoid colon versus nondistention. Possible mild colitis. 2. Moderate hiatal hernia with some intraluminal fluid, as well as mild distal gastric wall thickening,  nonspecific but can be seen with gastritis. 3. Hepatic steatosis. Chronic biliary prominence postcholecystectomy. Electronically Signed   By: MJeb LeveringM.D.   On: 06/24/2017 03:20    Review of Systems  Constitutional: Negative for chills and fever.  HENT: Negative for sore throat and tinnitus.   Eyes: Negative for blurred vision and redness.  Respiratory: Negative for cough and shortness of breath.   Cardiovascular: Negative for chest pain, palpitations, orthopnea and PND.  Gastrointestinal: Positive for abdominal pain, diarrhea, nausea and vomiting.  Genitourinary: Negative for dysuria, frequency and urgency.  Musculoskeletal: Negative for joint pain and myalgias.  Skin: Negative for rash.       No lesions  Neurological: Negative for speech change, focal weakness and weakness.  Endo/Heme/Allergies: Does not bruise/bleed easily.       No temperature intolerance  Psychiatric/Behavioral: Negative for depression and suicidal ideas.    Blood pressure (!) 170/71, pulse (!) 57, temperature 98.4 F (36.9 C), temperature source Oral, resp. rate 18, height '5\' 4"'$  (1.626 m), weight 72.1 kg (159 lb), SpO2 98 %. Physical Exam  Vitals reviewed. Constitutional: She is oriented to person, place, and time. She appears well-developed and well-nourished. No distress.  HENT:  Head: Normocephalic and atraumatic.  Mouth/Throat: Oropharynx is clear and moist.  Eyes: Conjunctivae and EOM are normal. Pupils are equal, round, and reactive to light. No scleral icterus.  Neck: Normal range  of motion. Neck supple. No JVD present. No tracheal deviation present. No thyromegaly present.  Cardiovascular: Normal rate, regular rhythm and normal heart sounds. Exam reveals no gallop and no friction rub.  No murmur heard. Respiratory: Effort normal and breath sounds normal.  GI: Soft. Bowel sounds are normal. She exhibits no distension and no mass. There is tenderness. There is no rebound and no guarding.   Genitourinary:  Genitourinary Comments: Deferred  Musculoskeletal: Normal range of motion. She exhibits no edema.  Lymphadenopathy:    She has no cervical adenopathy.  Neurological: She is alert and oriented to person, place, and time. No cranial nerve deficit. She exhibits normal muscle tone.  Skin: Skin is warm and dry. No rash noted. No erythema.  Psychiatric: She has a normal mood and affect. Her behavior is normal. Judgment and thought content normal.     Assessment/Plan This is a 76 year old female admitted for colitis. 1.  Colitis: Abdomen is abdomen is nonsurgical; no blood in stool.  Lactic acid is up indicating dehydration.  Hydrate with intravenous fluid.  Encourage p.o. intake.  Advance diet as tolerated.  Continue Flagyl.  I have added Cipro. 2.  Elevated troponin: Likely secondary to demand ischemia due to recurrent vomiting.  Monitor telemetry.  Follow cardiac biomarkers 3.  Hypertension: Controlled; continue metoprolol 4.  Hyponatremia: Secondary to hypovolemia.  Encourage p.o. intake as well as hydrate with intravenous fluid. 5.  Rheumatoid arthritis: Continue Plaquenil 6.  Asthma: Controlled; continue inhaled corticosteroid.  Albuterol as needed. 7.  Hyperlipidemia: Continue statin therapy 8.  GI prophylaxis: Pantoprazole per home regimen 9.  DVT prophylaxis: Lovenox The patient is a full code.  Time spent on admission orders and patient care approximately 45 minutes  Harrie Foreman, MD 06/24/2017, 7:19 AM

## 2017-06-24 NOTE — ED Notes (Signed)
Second IV attempted by this RN x1, and pt stuck x2 by two other RN's.

## 2017-06-24 NOTE — Progress Notes (Deleted)
Pt's troponin came back elevated at 0.09. Dr. Margaretmary Eddy notified. No new orders received.

## 2017-06-24 NOTE — Progress Notes (Signed)
Patient requested for Lozenges for sore throat. Dr. Jannifer Franklin notified and received new order for that . Patient received one Lozenge.  Will continue to monitor.

## 2017-06-24 NOTE — Progress Notes (Signed)
Pharmacy Antibiotic Note  Kaitlyn Huff is a 76 y.o. female admitted on 06/23/2017 with intra-abdominal infection.  Pharmacy has been consulted for ciprofloxacin dosing.  Plan: Ciprofloxacin 400 mg IV q 12 hours ordered  Height: 5\' 4"  (162.6 cm) Weight: 159 lb (72.1 kg) IBW/kg (Calculated) : 54.7  Temp (24hrs), Avg:98.2 F (36.8 C), Min:97.9 F (36.6 C), Max:98.4 F (36.9 C)  Recent Labs  Lab 06/24/17 0006 06/24/17 0143 06/24/17 0259  WBC 8.1  --   --   CREATININE 0.80  --   --   LATICACIDVEN  --  2.5* 2.1*    Estimated Creatinine Clearance: 60.1 mL/min (by C-G formula based on SCr of 0.8 mg/dL).    Allergies  Allergen Reactions  . Astelin [Azelastine Hcl] Other (See Comments)    Reaction:  Unknown   . Codeine Other (See Comments)    Reaction:  Altered mental status  . Dilaudid [Hydromorphone] Other (See Comments)    Reaction:  Unknown   . Flexeril [Cyclobenzaprine] Other (See Comments)    Reaction:  Unknown   . Imuran [Azathioprine] Other (See Comments)    Reaction:  Abnormal liver function  . Lisinopril Itching  . Lyrica [Pregabalin] Other (See Comments)    Reaction:  Sore gums   . Methotrexate Derivatives Other (See Comments)    Reaction:  Abnormal liver function  . Amoxicillin Rash and Other (See Comments)    Unable to obtain enough information to answer additional questions about this medication.    Jolee Ewing [Leflunomide] Rash  . Clindamycin/Lincomycin Rash  . Doxycycline Rash  . Lodine [Etodolac] Rash  . Percocet [Oxycodone-Acetaminophen] Rash  . Sulfa Antibiotics Rash    Antimicrobials this admission: Ceftriaxone x1. Ciprofloxacin 12/15  >>    >>   Dose adjustments this admission:   Microbiology results: 12/15 BCx: pending     12/15 UA: (-) Thank you for allowing pharmacy to be a part of this patient's care.  Kamren Heintzelman S 06/24/2017 7:05 AM

## 2017-06-24 NOTE — ED Notes (Signed)
Pt labs told to MD Rifenbark. Cultures ordered at this time.

## 2017-06-24 NOTE — ED Notes (Signed)
Pt has begun vomiting at this time.

## 2017-06-24 NOTE — ED Notes (Signed)
Telemetry called for update on bed assignment readiness. Per CN, extra staff is required at this point due to pt load on that floor. This RN left ascom number and accepting RN will call for report when available.

## 2017-06-25 LAB — BASIC METABOLIC PANEL
Anion gap: 8 (ref 5–15)
BUN: 11 mg/dL (ref 6–20)
CALCIUM: 8.1 mg/dL — AB (ref 8.9–10.3)
CO2: 22 mmol/L (ref 22–32)
CREATININE: 0.91 mg/dL (ref 0.44–1.00)
Chloride: 100 mmol/L — ABNORMAL LOW (ref 101–111)
GFR calc Af Amer: >60 mL/min (ref >60)
Glucose, Bld: 87 mg/dL (ref 65–99)
Potassium: 3.1 mmol/L — ABNORMAL LOW (ref 3.5–5.1)
SODIUM: 130 mmol/L — AB (ref 135–145)

## 2017-06-25 LAB — CBC
HCT: 29.6 % — ABNORMAL LOW (ref 35.0–47.0)
Hemoglobin: 10.1 g/dL — ABNORMAL LOW (ref 12.0–16.0)
MCH: 32.3 pg (ref 26.0–34.0)
MCHC: 34 g/dL (ref 32.0–36.0)
MCV: 95 fL (ref 80.0–100.0)
PLATELETS: 193 10*3/uL (ref 150–440)
RBC: 3.11 MIL/uL — ABNORMAL LOW (ref 3.80–5.20)
RDW: 13.7 % (ref 11.5–14.5)
WBC: 7.9 10*3/uL (ref 3.6–11.0)

## 2017-06-25 LAB — MAGNESIUM: MAGNESIUM: 1.6 mg/dL — AB (ref 1.7–2.4)

## 2017-06-25 MED ORDER — AMIODARONE HCL IN DEXTROSE 360-4.14 MG/200ML-% IV SOLN
60.0000 mg/h | INTRAVENOUS | Status: AC
  Administered 2017-06-25 (×2): 60 mg/h via INTRAVENOUS
  Filled 2017-06-25 (×2): qty 200

## 2017-06-25 MED ORDER — DILTIAZEM HCL 25 MG/5ML IV SOLN
5.0000 mg | Freq: Four times a day (QID) | INTRAVENOUS | Status: DC | PRN
  Administered 2017-06-25: 5 mg via INTRAVENOUS
  Filled 2017-06-25 (×2): qty 5

## 2017-06-25 MED ORDER — AMIODARONE HCL IN DEXTROSE 360-4.14 MG/200ML-% IV SOLN
30.0000 mg/h | INTRAVENOUS | Status: DC
  Administered 2017-06-25 (×2): 30 mg/h via INTRAVENOUS
  Filled 2017-06-25: qty 200

## 2017-06-25 MED ORDER — POTASSIUM CHLORIDE CRYS ER 20 MEQ PO TBCR: 40.0000 meq | EXTENDED_RELEASE_TABLET | ORAL | Status: DC

## 2017-06-25 MED ORDER — POTASSIUM CHLORIDE 20 MEQ PO PACK
40.0000 meq | PACK | Freq: Once | ORAL | Status: AC
  Administered 2017-06-25: 40 meq via ORAL
  Filled 2017-06-25: qty 2

## 2017-06-25 MED ORDER — MAGNESIUM SULFATE 2 GM/50ML IV SOLN
2.0000 g | Freq: Once | INTRAVENOUS | Status: AC
  Administered 2017-06-25: 2 g via INTRAVENOUS
  Filled 2017-06-25: qty 50

## 2017-06-25 MED ORDER — GUAIFENESIN-DM 100-10 MG/5ML PO SYRP
5.0000 mL | ORAL_SOLUTION | ORAL | Status: DC | PRN
  Administered 2017-06-25 – 2017-06-26 (×2): 5 mL via ORAL
  Filled 2017-06-25 (×2): qty 5

## 2017-06-25 MED ORDER — DILTIAZEM HCL 30 MG PO TABS
30.0000 mg | ORAL_TABLET | Freq: Four times a day (QID) | ORAL | Status: DC
  Filled 2017-06-25: qty 1

## 2017-06-25 MED ORDER — AMIODARONE LOAD VIA INFUSION
150.0000 mg | Freq: Once | INTRAVENOUS | Status: AC
  Administered 2017-06-25: 150 mg via INTRAVENOUS
  Filled 2017-06-25: qty 83.34

## 2017-06-25 MED ORDER — AMIODARONE HCL 200 MG PO TABS
400.0000 mg | ORAL_TABLET | Freq: Two times a day (BID) | ORAL | Status: DC
  Administered 2017-06-26: 400 mg via ORAL
  Filled 2017-06-25: qty 2

## 2017-06-25 MED ORDER — POTASSIUM CHLORIDE 10 MEQ/100ML IV SOLN
10.0000 meq | INTRAVENOUS | Status: DC
  Administered 2017-06-25: 10 meq via INTRAVENOUS
  Filled 2017-06-25 (×4): qty 100

## 2017-06-25 NOTE — Progress Notes (Signed)
Pt complained of sore throat PRN lorenges, meds was given. Will continue to monitor.

## 2017-06-25 NOTE — Progress Notes (Signed)
CCMD called regarding pt on rm 251 at 0940 and report4ed that HR at 130''0 to 140's. Doctor Gouru was notified and ordered Cardiazem IV 5mg /33ml IV. Meds was given and will continue to [facility]

## 2017-06-25 NOTE — Progress Notes (Signed)
Rm 251 cardiazem was on hold per doctor Humphrey Rolls. Pt HR was sustaining at 66. Will continue to monitor

## 2017-06-25 NOTE — Progress Notes (Addendum)
[place] [facility] [place] NAME: Kaitlyn Huff    MR#:  [id number]  DATE OF BIRTH:  03/26/1941  SUBJECTIVE:  CHIEF COMPLAINT: Patient has paroxysmal atrial fibrillation and heart rate went up to 140s today  Tolerating liquids.  Denies any abdominal pain.  Son at bedside.  REVIEW OF SYSTEMS:  CONSTITUTIONAL: No fever, fatigue or weakness.  EYES: No blurred or double vision.  EARS, NOSE, AND THROAT: No tinnitus or ear pain.  RESPIRATORY: No cough, shortness of breath, wheezing or hemoptysis.  CARDIOVASCULAR: No chest pain, orthopnea, edema.  GASTROINTESTINAL: No nausea, vomiting, diarrhea or abdominal pain.  GENITOURINARY: No dysuria, hematuria.  ENDOCRINE: No polyuria, nocturia,  HEMATOLOGY: No anemia, easy bruising or bleeding SKIN: No rash or lesion. MUSCULOSKELETAL: No joint pain or arthritis.   NEUROLOGIC: No tingling, numbness, weakness.  PSYCHIATRY: No anxiety or depression.   DRUG ALLERGIES:   Allergies  Allergen Reactions  . Astelin [Azelastine Hcl] Other (See Comments)    Reaction:  Unknown   . Codeine Other (See Comments)    Reaction:  Altered mental status  . Dilaudid [Hydromorphone] Other (See Comments)    Reaction:  Unknown   . Flexeril [Cyclobenzaprine] Other (See Comments)    Reaction:  Unknown   . Imuran [Azathioprine] Other (See Comments)    Reaction:  Abnormal liver function  . Lisinopril Itching  . Lyrica [Pregabalin] Other (See Comments)    Reaction:  Sore gums   . Methotrexate Derivatives Other (See Comments)    Reaction:  Abnormal liver function  . Amoxicillin Rash and Other (See Comments)    Unable to obtain enough information to answer additional questions about this medication.    Jolee Ewing [Leflunomide] Rash  . Clindamycin/Lincomycin Rash  . Doxycycline Rash  . Lodine [Etodolac] Rash  . Percocet [Oxycodone-Acetaminophen] Rash  . Sulfa Antibiotics Rash    VITALS:  Blood pressure 133/79, pulse (!)  125, temperature 98.1 F (36.7 C), temperature source Oral, resp. rate 18, height 5\' 4"  (1.626 m), weight 73.3 kg (161 lb 11.2 oz), SpO2 95 %.  PHYSICAL EXAMINATION:  GENERAL:  76 y.o.-year-old patient lying in the bed with no acute distress.  EYES: Pupils equal, round, reactive to light and accommodation. No scleral icterus. Extraocular muscles intact.  HEENT: Head atraumatic, normocephalic. Oropharynx and nasopharynx clear.  NECK:  Supple, no jugular venous distention. No thyroid enlargement, no tenderness.  LUNGS: Normal breath sounds bilaterally, no wheezing, rales,rhonchi or crepitation. No use of accessory muscles of respiration.  CARDIOVASCULAR: Irregularly irregular. No murmurs, rubs, or gallops.  ABDOMEN: Soft, nontender, nondistended. Bowel sounds present. No organomegaly or mass.  EXTREMITIES: No pedal edema, cyanosis, or clubbing.  NEUROLOGIC: Cranial nerves II through XII are intact. Muscle strength 5/5 in all extremities. Sensation intact. Gait not checked.  PSYCHIATRIC: The patient is alert and oriented x 3.  SKIN: No obvious rash, lesion, or ulcer.    LABORATORY PANEL:   CBC Recent Labs  Lab 06/25/17 0505  WBC 7.9  HGB 10.1*  HCT 29.6*  PLT 193   ------------------------------------------------------------------------------------------------------------------  Chemistries  Recent Labs  Lab 06/24/17 0006 06/25/17 0505  NA 131* 130*  K 3.7 3.1*  CL 95* 100*  CO2 20* 22  GLUCOSE 115* 87  BUN 10 11  CREATININE 0.80 0.91  CALCIUM 9.1 8.1*  AST 49*  --   ALT 25  --   ALKPHOS 44  --   BILITOT 1.3*  --    ------------------------------------------------------------------------------------------------------------------  Cardiac Enzymes Recent Labs  Lab 06/24/17 2047  TROPONINI 0.08*   ------------------------------------------------------------------------------------------------------------------  RADIOLOGY:  Ct Abdomen Pelvis W Contrast  Result  Date: 06/24/2017 CLINICAL DATA:  Sudden onset of abdominal pain. EXAM: CT ABDOMEN AND PELVIS WITH CONTRAST TECHNIQUE: Multidetector CT imaging of the abdomen and pelvis was performed using the standard protocol following bolus administration of intravenous contrast. CONTRAST:  136mL ISOVUE-300 IOPAMIDOL (ISOVUE-300) INJECTION 61% COMPARISON:  Abdominal ultrasound 06/12/2017.  CT 06/19/2016 FINDINGS: Lower chest: Mild cardiomegaly. No pleural fluid or consolidation. Moderate hiatal hernia with minimal fluid in the intrathoracic stomach. Hepatobiliary: Decreased hepatic density consistent with steatosis. Postcholecystectomy with clips in the gallbladder fossa. Stable dilatation of the common bile duct post cholecystectomy measuring 10 mm distally. Pancreas: No ductal dilatation or inflammation. 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 Spleen is normal in size. Adrenals/Urinary Tract: Normal adrenal glands. No hydronephrosis. Homogeneous renal enhancement with symmetric excretion on delayed phase imaging. Symmetric mild bilateral perinephric edema is unchanged from prior exam. Urinary bladder is distended without wall thickening. Stomach/Bowel: Moderate hiatal hernia. Small amount of fluid in the intrathoracic and subdiaphragmatic stomach mild distal gastric wall thickening. No small bowel obstruction or inflammation. Majority of the colon is decompressed. Possible wall thickening involving the descending and proximal sigmoid colon. Descending and sigmoid diverticulosis without acute diverticulitis. Normal appendix. Vascular/Lymphatic: Scattered aortic atherosclerosis. No aneurysm. Prominent right inguinal nodes measuring up to 10 mm, likely reactive. No enlarged abdominal lymph nodes. Reproductive: Status post hysterectomy. No adnexal masses. Other: No free air, free fluid, or intra-abdominal fluid collection. Small fat containing umbilical hernia.  Musculoskeletal: Bones are under mineralized with multilevel degenerative change in the spine. IMPRESSION: 1. Equivocal colonic wall thickening of the descending and proximal sigmoid colon versus nondistention. Possible mild colitis. 2. Moderate hiatal hernia with some intraluminal fluid, as well as mild distal gastric wall thickening, nonspecific but can be seen with gastritis. 3. Hepatic steatosis. Chronic biliary prominence postcholecystectomy. Electronically Signed   By: Jeb Levering M.D.   On: 06/24/2017 03:20    EKG:   Orders placed or performed during the hospital encounter of 06/23/17  . ED EKG  . ED EKG  . EKG 12-Lead  . EKG 12-Lead    ASSESSMENT AND PLAN:    This is a 76 year old female admitted for colitis.  1.  -Acute colitis; continue abdomen is nonsurgical; no blood in stool.  Ciprofloxacin and Flagyl  advance diet as tolerated  Lactic acid is up indicating dehydration.  Lactic acid 2.5-2.1 Hydrate with intravenous fluid.  Encourage p.o. intake.  Advance diet as tolerated.    2. Atrial fibrillation with RVR Cardizem IV as needed and patient is started on Cardizem 30 mg by mouth every 6 hours Amiodarone infusion KC cardio f/u  Patient is  on aspirin 81 mg , not on NOAC  will defer to cardiology  Elevated troponin: Likely secondary to demand ischemia   Monitor telemetry.   Cardiac biomarkers are not trending  patient is a symptomatic from cardiac standpoint  3.  Hypertension: Controlled; continue metoprolol  4.  Hyponatremia/hypokalemia: Secondary to hypovolemia.  Encourage p.o. intake as well as hydrate with intravenous fluid.  5.  Rheumatoid arthritis: Continue Plaquenil  6.  Asthma: Controlled; continue inhaled corticosteroid.  Albuterol as needed.  7.  Hyperlipidemia: Continue statin therapy  8.  GI prophylaxis: Pantoprazole   9.  DVT prophylaxis: Lovenox      All the records are reviewed and case discussed with Care Management/Social  Workerr. Management plans discussed with the patient, son at bedside and they are in agreement.  CODE STATUS: Full code  TOTAL TIME TAKING CARE OF THIS PATIENT:35 minutes.   POSSIBLE D/C IN 2 DAYS, DEPENDING ON CLINICAL CONDITION.  Note: This dictation was prepared with Dragon dictation along with smaller phrase technology. Any transcriptional errors that result from this process are unintentional.   Nicholes Mango M.D on 06/25/2017 at 12:24 PM  Between 7am to 6pm - Pager - [phone] After 6pm go to [email or web address] - password [email or web address] [facility]  Office  [phone]  CC: Primary care physician; [name], MD

## 2017-06-25 NOTE — Progress Notes (Signed)
[facility] for Electrolytes Indication: hypokalemia and hypomagnesemia  Labs:  Sodium (mmol/L)  Date Value  06/25/2017 130 (L)  11/03/2014 140   Potassium (mmol/L)  Date Value  06/25/2017 3.1 (L)  11/03/2014 3.7   Magnesium (mg/dL)  Date Value  06/25/2017 1.6 (L)   Calcium (mg/dL)  Date Value  06/25/2017 8.1 (L)   Calcium, Total (mg/dL)  Date Value  11/03/2014 7.6 (L)   Albumin (g/dL)  Date Value  06/24/2017 4.4  11/01/2014 4.0    Estimated Creatinine Clearance: 53.2 mL/min (by C-G formula based on SCr of 0.91 mg/dL).  Assessment: 76 yo female admitted with acute colitis. Pharmacy has been consulted to replace and monitor electrolytes.   Goal of Therapy:  K = 3.5 - 5.1 Mg = 1.7 - 2.4  Plan:  K = 3.1, Mg = 1.6. Patient is hypokalemic and hypomagnesemic. Patient is currently receiving K 49mEq oral daily. Will given additional K 66mEq IV x4 and check 6 hours after the last dose. Will also give magnesium 2g IV once and recheck with AM labs.    Lendon Ka, PharmD Pharmacy Resident 06/25/2017,3:23 PM

## 2017-06-25 NOTE — Progress Notes (Signed)
[facility] for Electrolytes Indication: hypokalemia and hypomagnesemia  Labs:  Sodium (mmol/L)  Date Value  06/25/2017 130 (L)  11/03/2014 140   Potassium (mmol/L)  Date Value  06/25/2017 3.1 (L)  11/03/2014 3.7   Magnesium (mg/dL)  Date Value  06/25/2017 1.6 (L)   Calcium (mg/dL)  Date Value  06/25/2017 8.1 (L)   Calcium, Total (mg/dL)  Date Value  11/03/2014 7.6 (L)   Albumin (g/dL)  Date Value  06/24/2017 4.4  11/01/2014 4.0    Estimated Creatinine Clearance: 53.2 mL/min (by C-G formula based on SCr of 0.91 mg/dL).  Assessment: 76 yo female admitted with acute colitis. Pharmacy has been consulted to replace and monitor electrolytes.   Goal of Therapy:  K = 3.5 - 5.1 Mg = 1.7 - 2.4  Plan:  K = 3.1, Mg = 1.6. Patient is hypokalemic and hypomagnesemic. Patient is currently receiving K 26mEq oral daily. Will given additional K 68mEq IV x4 and magnesium 2g IV once and recheck with AM labs.    1630 Patient not tolerating K IV runs, instead will replace with K 67mEq PO once and recheck with AM labs.  Lendon Ka, PharmD Pharmacy Resident 06/25/2017,4:27 PM

## 2017-06-25 NOTE — Progress Notes (Signed)
Pt. Requested cough medication, new order see emar. When I asked pt if she was coughing she said she felt like it was going to start and she wanted to head it off.

## 2017-06-25 NOTE — Consult Note (Signed)
Kaitlyn Huff is a 76 y.o. female  [id number]  Primary Cardiologist: [facility] cardiology Reason for Consultation: atrial fibrillation  HPI: This is a 76 year old white female presented to the hospital with nausea vomiting and abdominal pain and was found to have colitis. She was transferred to telemetry because of atrial fibrillation with rapid ventricular response rate.She is complaining of palpitation but no chest pain.   Review of Systems: no chest pain   Past Medical History:  Diagnosis Date  . Anemia   . Cancer ([place])   . Diverticulitis   . GERD (gastroesophageal reflux disease)   . Hyperlipidemia   . Hypertension   . Osteoarthritis   . Osteoporosis    actonel  . Positive PPD    s/p INH (2006)  . Rheumatoid arthritis(714.0)    MTX transaminitis, Leflunomide (rash), enbrel, plaquinil, prednisone, remicade, Imuran (transaminitis)  . Valvular heart disease    moderate MR and TR    Medications Prior to Admission  Medication Sig Dispense Refill  . acidophilus (RISAQUAD) CAPS capsule Take 1 capsule by mouth daily.    Marland Kitchen ADVAIR DISKUS 250-50 MCG/DOSE AEPB USE 1 INHALATION EVERY 12 HOURS 180 each 0  . albuterol (PROAIR HFA) 108 (90 Base) MCG/ACT inhaler USE 2 INHALATIONS EVERY 6 HOURS AS NEEDED FOR WHEEZING OR SHORTNESS OF BREATH 54 g 1  . alendronate (FOSAMAX) 70 MG tablet Take 70 mg by mouth once a week.     . ALPRAZolam (XANAX) 0.25 MG tablet Take 1 tablet (0.25 mg total) by mouth daily as needed for anxiety. 90 tablet 0  . aspirin EC 81 MG tablet Take 81 mg by mouth daily.    . Calcium Carbonate-Vitamin D (CALCIUM 600+D) 600-400 MG-UNIT tablet Take 1 tablet by mouth 2 (two) times daily.     . cetirizine (ZYRTEC) 10 MG tablet TAKE 1 TABLET DAILY 90 tablet 1  . cholecalciferol (VITAMIN D) 1000 units tablet Take 1,000 Units by mouth daily.    . colestipol (COLESTID) 1 g tablet Take 2 g by mouth daily.     Marland Kitchen esomeprazole (NEXIUM) 20 MG capsule Take 20 mg by mouth daily at 12  noon.    Marland Kitchen FeFum-FePo-FA-B Cmp-C-Zn-Mn-Cu (TANDEM PLUS) 162-115.2-1 MG CAPS TAKE 1 CAPSULE DAILY 90 each 0  . fluticasone (FLONASE) 50 MCG/ACT nasal spray Place 2 sprays into both nostrils daily. (Patient taking differently: Place 2 sprays into both nostrils 2 (two) times daily. ) 16 g 6  . furosemide (LASIX) 20 MG tablet Take 2 tablets (40 mg total) by mouth daily. (Patient taking differently: Take 40 mg by mouth daily as needed for fluid or edema. )    . gabapentin (NEURONTIN) 300 MG capsule Take 300 mg by mouth 4 (four) times daily.     . hydroxychloroquine (PLAQUENIL) 200 MG tablet Take 200 mg by mouth 2 (two) times daily.     . meclizine (ANTIVERT) 25 MG tablet Take 1 tablet (25 mg total) by mouth 3 (three) times daily as needed for dizziness. 15 tablet 0  . metoprolol succinate (TOPROL XL) 50 MG 24 hr tablet Take 1 tablet (50 mg total) by mouth daily. Take with or immediately following a meal. 90 tablet 1  . Multiple Vitamin (MULTIVITAMIN WITH MINERALS) TABS tablet Take 1 tablet by mouth daily.    . ondansetron (ZOFRAN ODT) 4 MG disintegrating tablet Take 1 tablet (4 mg total) by mouth every 8 (eight) hours as needed for nausea or vomiting. 20 tablet 0  . potassium  chloride (KLOR-CON SPRINKLE) 10 MEQ CR capsule TAKE 2 CAPSULES Once a day 360 capsule 3  . predniSONE (DELTASONE) 5 MG tablet Take 1 tablet (5 mg total) by mouth daily with breakfast.    . simvastatin (ZOCOR) 20 MG tablet TAKE 1 TABLET DAILY 90 tablet 1  . sodium chloride (OCEAN) 0.65 % nasal spray Place 1 spray into the nose as needed for congestion.    Marland Kitchen SPIRIVA HANDIHALER 18 MCG inhalation capsule INHALE THE CONTENTS OF 1 CAPSULE DAILY 90 capsule 1  . traMADol (ULTRAM) 50 MG tablet Take 50 mg by mouth 2 (two) times daily as needed for moderate pain.    . Diphenhyd-Hydrocort-Nystatin (FIRST-DUKES MOUTHWASH) SUSP Pt is to swish and spit 5 cc's tid (Patient not taking: Reported on 06/24/2017) 237 mL 0  . guaiFENesin-dextromethorphan  (ROBITUSSIN DM) 100-10 MG/5ML syrup Take 5 mLs by mouth every 4 (four) hours as needed for cough. (Patient not taking: Reported on 06/24/2017) 118 mL 0  . lip balm (BLISTEX) OINT Apply 1 application topically as needed for lip care. (Patient not taking: Reported on 06/24/2017) 1 Tube 0     . acidophilus  1 capsule Oral Daily  . amiodarone  150 mg Intravenous Once  . aspirin EC  81 mg Oral Daily  . calcium-vitamin D  1 tablet Oral BID  . cholecalciferol  1,000 Units Oral Daily  . colestipol  2 g Oral Daily  . diltiazem  30 mg Oral Q6H  . docusate sodium  100 mg Oral BID  . enoxaparin (LOVENOX) injection  40 mg Subcutaneous Q24H  . fluticasone  2 spray Each Nare Daily  . gabapentin  300 mg Oral QID  . hydroxychloroquine  200 mg Oral BID  . loratadine  10 mg Oral Daily  . metoprolol succinate  50 mg Oral Daily  . mometasone-formoterol  2 puff Inhalation BID  . multivitamin with minerals  1 tablet Oral Daily  . pantoprazole  40 mg Oral Daily  . potassium chloride  20 mEq Oral Daily  . predniSONE  5 mg Oral Q breakfast  . simvastatin  20 mg Oral Daily  . tiotropium  1 capsule Inhalation Daily    Infusions: . sodium chloride 125 mL/hr at 06/24/17 2120  . amiodarone 60 mg/hr (06/25/17 1210)   Followed by  . amiodarone    . ciprofloxacin Stopped (06/25/17 6237)    Allergies  Allergen Reactions  . Astelin [Azelastine Hcl] Other (See Comments)    Reaction:  Unknown   . Codeine Other (See Comments)    Reaction:  Altered mental status  . Dilaudid [Hydromorphone] Other (See Comments)    Reaction:  Unknown   . Flexeril [Cyclobenzaprine] Other (See Comments)    Reaction:  Unknown   . Imuran [Azathioprine] Other (See Comments)    Reaction:  Abnormal liver function  . Lisinopril Itching  . Lyrica [Pregabalin] Other (See Comments)    Reaction:  Sore gums   . Methotrexate Derivatives Other (See Comments)    Reaction:  Abnormal liver function  . Amoxicillin Rash and Other (See  Comments)    Unable to obtain enough information to answer additional questions about this medication.    Jolee Ewing [Leflunomide] Rash  . Clindamycin/Lincomycin Rash  . Doxycycline Rash  . Lodine [Etodolac] Rash  . Percocet [Oxycodone-Acetaminophen] Rash  . Sulfa Antibiotics Rash    Social History   Socioeconomic History  . Marital status: Widowed    Spouse name: Not on file  . Number of  children: 4  . Years of education: Not on file  . Highest education level: Not on file  Social Needs  . Financial resource strain: Not on file  . Food insecurity - worry: Not on file  . Food insecurity - inability: Not on file  . Transportation needs - medical: Not on file  . Transportation needs - non-medical: Not on file  Occupational History  . Occupation: retired  Tobacco Use  . Smoking status: Never Smoker  . Smokeless tobacco: Never Used  Substance and Sexual Activity  . Alcohol use: No    Alcohol/week: 0.0 oz  . Drug use: No  . Sexual activity: No  Other Topics Concern  . Not on file  Social History Narrative   Lives with family at home    Family History  Problem Relation Age of Onset  . Heart disease Father        MI  . Heart disease Mother   . Valvular heart disease Mother   . Breast cancer Sister 36  . Cancer Sister        Lung cancer  . Colon cancer Neg Hx     PHYSICAL EXAM: Vitals:   06/25/17 0839 06/25/17 0959  BP: (!) 141/62 (!) 143/84  Pulse: 64 (!) 123  Resp: 18   Temp: 98.1 F (36.7 C)   SpO2: 95%      Intake/Output Summary (Last 24 hours) at 06/25/2017 1210 Last data filed at 06/25/2017 1102 Gross per 24 hour  Intake 940 ml  Output 3100 ml  Net -2160 ml    General:  Well appearing. No respiratory difficulty HEENT: normal Neck: supple. no JVD. Carotids 2+ bilat; no bruits. No lymphadenopathy or thryomegaly appreciated. Cor: PMI nondisplaced. Regular rate & rhythm. No rubs, gallops or murmurs. Lungs: clear Abdomen: soft, nontender,  nondistended. No hepatosplenomegaly. No bruits or masses. Good bowel sounds. Extremities: no cyanosis, clubbing, rash, edema Neuro: alert & oriented x 3, cranial nerves grossly intact. moves all 4 extremities w/o difficulty. Affect pleasant.  ECG: atrial fibrillation with ventricular rate 101  Results for orders placed or performed during the hospital encounter of 06/23/17 (from the past 24 hour(s))  Troponin I     Status: Abnormal   Collection Time: 06/24/17  1:43 PM  Result Value Ref Range   Troponin I 0.10 (HH) <0.03 ng/mL  Troponin I (q 6hr x 3)     Status: Abnormal   Collection Time: 06/24/17  3:08 PM  Result Value Ref Range   Troponin I 0.09 (HH) <0.03 ng/mL  Troponin I     Status: Abnormal   Collection Time: 06/24/17  6:47 PM  Result Value Ref Range   Troponin I 0.09 (HH) <0.03 ng/mL  Troponin I (q 6hr x 3)     Status: Abnormal   Collection Time: 06/24/17  8:47 PM  Result Value Ref Range   Troponin I 0.08 (HH) <0.03 ng/mL  Basic metabolic panel     Status: Abnormal   Collection Time: 06/25/17  5:05 AM  Result Value Ref Range   Sodium 130 (L) 135 - 145 mmol/L   Potassium 3.1 (L) 3.5 - 5.1 mmol/L   Chloride 100 (L) 101 - 111 mmol/L   CO2 22 22 - 32 mmol/L   Glucose, Bld 87 65 - 99 mg/dL   BUN 11 6 - 20 mg/dL   Creatinine, Ser 0.91 0.44 - 1.00 mg/dL   Calcium 8.1 (L) 8.9 - 10.3 mg/dL   GFR calc non Af Amer >  60 >60 mL/min   GFR calc Af Amer >60 >60 mL/min   Anion gap 8 5 - 15  CBC     Status: Abnormal   Collection Time: 06/25/17  5:05 AM  Result Value Ref Range   WBC 7.9 3.6 - 11.0 K/uL   RBC 3.11 (L) 3.80 - 5.20 MIL/uL   Hemoglobin 10.1 (L) 12.0 - 16.0 g/dL   HCT 29.6 (L) 35.0 - 47.0 %   MCV 95.0 80.0 - 100.0 fL   MCH 32.3 26.0 - 34.0 pg   MCHC 34.0 32.0 - 36.0 g/dL   RDW 13.7 11.5 - 14.5 %   Platelets 193 150 - 440 K/uL   Ct Abdomen Pelvis W Contrast  Result Date: 06/24/2017 CLINICAL DATA:  Sudden onset of abdominal pain. EXAM: CT ABDOMEN AND PELVIS WITH  CONTRAST TECHNIQUE: Multidetector CT imaging of the abdomen and pelvis was performed using the standard protocol following bolus administration of intravenous contrast. CONTRAST:  144mL ISOVUE-300 IOPAMIDOL (ISOVUE-300) INJECTION 61% COMPARISON:  Abdominal ultrasound 06/12/2017.  CT 06/19/2016 FINDINGS: Lower chest: Mild cardiomegaly. No pleural fluid or consolidation. Moderate hiatal hernia with minimal fluid in the intrathoracic stomach. Hepatobiliary: Decreased hepatic density consistent with steatosis. Postcholecystectomy with clips in the gallbladder fossa. Stable dilatation of the common bile duct post cholecystectomy measuring 10 mm distally. Pancreas: No ductal dilatation or inflammation. 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 Spleen is normal in size. Adrenals/Urinary Tract: Normal adrenal glands. No hydronephrosis. Homogeneous renal enhancement with symmetric excretion on delayed phase imaging. Symmetric mild bilateral perinephric edema is unchanged from prior exam. Urinary bladder is distended without wall thickening. Stomach/Bowel: Moderate hiatal hernia. Small amount of fluid in the intrathoracic and subdiaphragmatic stomach mild distal gastric wall thickening. No small bowel obstruction or inflammation. Majority of the colon is decompressed. Possible wall thickening involving the descending and proximal sigmoid colon. Descending and sigmoid diverticulosis without acute diverticulitis. Normal appendix. Vascular/Lymphatic: Scattered aortic atherosclerosis. No aneurysm. Prominent right inguinal nodes measuring up to 10 mm, likely reactive. No enlarged abdominal lymph nodes. Reproductive: Status post hysterectomy. No adnexal masses. Other: No free air, free fluid, or intra-abdominal fluid collection. Small fat containing umbilical hernia. Musculoskeletal: Bones are under mineralized with multilevel degenerative change in the spine. IMPRESSION: 1.  Equivocal colonic wall thickening of the descending and proximal sigmoid colon versus nondistention. Possible mild colitis. 2. Moderate hiatal hernia with some intraluminal fluid, as well as mild distal gastric wall thickening, nonspecific but can be seen with gastritis. 3. Hepatic steatosis. Chronic biliary prominence postcholecystectomy. Electronically Signed   By: Jeb Levering M.D.   On: 06/24/2017 03:20     ASSESSMENT AND PLAN: atrial fibrillation with rapid ventricular response rate. Patient states that couple years but EKG in April of this year. It appears to be new onset. Adviseamiodarone drip and converted to sinus rhythm. She had normal ejection fraction last year with mild to moderate mitral regurgitation, but will repeat echocardiogram.  KHAN,SHAUKAT A

## 2017-06-26 LAB — BASIC METABOLIC PANEL
ANION GAP: 6 (ref 5–15)
BUN: 8 mg/dL (ref 6–20)
CALCIUM: 8.3 mg/dL — AB (ref 8.9–10.3)
CO2: 22 mmol/L (ref 22–32)
CREATININE: 0.92 mg/dL (ref 0.44–1.00)
Chloride: 108 mmol/L (ref 101–111)
GFR, EST NON AFRICAN AMERICAN: 60 mL/min — AB (ref >60)
Glucose, Bld: 109 mg/dL — ABNORMAL HIGH (ref 65–99)
Potassium: 4.1 mmol/L (ref 3.5–5.1)
SODIUM: 136 mmol/L (ref 135–145)

## 2017-06-26 LAB — CBC
HEMATOCRIT: 30.9 % — AB (ref 35.0–47.0)
Hemoglobin: 10.5 g/dL — ABNORMAL LOW (ref 12.0–16.0)
MCH: 32.8 pg (ref 26.0–34.0)
MCHC: 34 g/dL (ref 32.0–36.0)
MCV: 96.4 fL (ref 80.0–100.0)
Platelets: 186 10*3/uL (ref 150–440)
RBC: 3.21 MIL/uL — ABNORMAL LOW (ref 3.80–5.20)
RDW: 13.8 % (ref 11.5–14.5)
WBC: 5.7 10*3/uL (ref 3.6–11.0)

## 2017-06-26 LAB — MAGNESIUM: MAGNESIUM: 2.1 mg/dL (ref 1.7–2.4)

## 2017-06-26 MED ORDER — ACETAMINOPHEN 325 MG PO TABS: 650.0000 mg | ORAL_TABLET | Freq: Four times a day (QID) | ORAL | Status: DC | PRN

## 2017-06-26 MED ORDER — AMIODARONE HCL 400 MG PO TABS
400.0000 mg | ORAL_TABLET | Freq: Every day | ORAL | 0 refills | Status: DC
Start: 2017-06-27 — End: 2017-12-21

## 2017-06-26 MED ORDER — CIPROFLOXACIN HCL 500 MG PO TABS
500.0000 mg | ORAL_TABLET | Freq: Two times a day (BID) | ORAL | 0 refills | Status: AC
Start: 2017-06-26 — End: 2017-07-06

## 2017-06-26 MED ORDER — METRONIDAZOLE 500 MG PO TABS: 500.0000 mg | ORAL_TABLET | Freq: Three times a day (TID) | ORAL | 0 refills | Status: AC

## 2017-06-26 MED ORDER — METOPROLOL SUCCINATE ER 25 MG PO TB24: 25.0000 mg | ORAL_TABLET | Freq: Every day | ORAL | 0 refills | Status: DC

## 2017-06-26 MED ORDER — AMIODARONE HCL 200 MG PO TABS: 400.0000 mg | ORAL_TABLET | Freq: Every day | ORAL | Status: DC

## 2017-06-26 MED ORDER — METOPROLOL SUCCINATE ER 25 MG PO TB24: 25.0000 mg | ORAL_TABLET | Freq: Every day | ORAL | Status: DC

## 2017-06-26 NOTE — Progress Notes (Signed)
Pt. Slept well throughout the night waking to use the bathroom. HR has been running in high 50's and 60's. C/o cough x1 with effective results, (I haven't heard pt. Cough). No c/o pain, SOB or acute distress observed. Will continue to monitor pt.

## 2017-06-26 NOTE — Discharge Instructions (Signed)
Follow-up with primary care physician in a week Follow-up with cardiology Dr. Ubaldo Glassing in a week

## 2017-06-26 NOTE — Plan of Care (Signed)
  Progressing Education: Knowledge of General Education information will improve 06/26/2017 1242 - Progressing by Darrelyn Hillock, RN Health Behavior/Discharge Planning: Ability to manage health-related needs will improve 06/26/2017 1242 - Progressing by Darrelyn Hillock, RN Clinical Measurements: Ability to maintain clinical measurements within normal limits will improve 06/26/2017 1242 - Progressing by Darrelyn Hillock, RN Will remain free from infection 06/26/2017 1242 - Progressing by Darrelyn Hillock, RN Diagnostic test results will improve 06/26/2017 1242 - Progressing by Darrelyn Hillock, RN Respiratory complications will improve 06/26/2017 1242 - Progressing by Darrelyn Hillock, RN Cardiovascular complication will be avoided 06/26/2017 1242 - Progressing by Darrelyn Hillock, RN Activity: Risk for activity intolerance will decrease 06/26/2017 1242 - Progressing by Darrelyn Hillock, RN Nutrition: Adequate nutrition will be maintained 06/26/2017 1242 - Progressing by Darrelyn Hillock, RN Coping: Level of anxiety will decrease 06/26/2017 1242 - Progressing by Darrelyn Hillock, RN Elimination: Will not experience complications related to bowel motility 06/26/2017 1242 - Progressing by Darrelyn Hillock, RN Will not experience complications related to urinary retention 06/26/2017 1242 - Progressing by Darrelyn Hillock, RN Pain Managment: General experience of comfort will improve 06/26/2017 1242 - Progressing by Darrelyn Hillock, RN Safety: Ability to remain free from injury will improve 06/26/2017 1242 - Progressing by Darrelyn Hillock, RN Skin Integrity: Risk for impaired skin integrity will decrease 06/26/2017 1242 - Progressing by Darrelyn Hillock, RN

## 2017-06-26 NOTE — Discharge Summary (Signed)
[facility] [place] NAME: Kaitlyn Huff    MR#:  [id number]  DATE OF BIRTH:  Nov 30, 1940  DATE OF ADMISSION:  06/23/2017 ADMITTING PHYSICIAN: Harrie Foreman, MD  DATE OF DISCHARGE: 12/ 17/18  PRIMARY CARE PHYSICIAN: [name], MD    ADMISSION DIAGNOSIS:  Lactic acidosis [E87.2] Colitis [K52.9] Intractable vomiting with nausea, unspecified vomiting type [R11.2]  DISCHARGE DIAGNOSIS:  Active Problems:   Colitis  afib with RVR   SECONDARY DIAGNOSIS:   Past Medical History:  Diagnosis Date  . Anemia   . Cancer ([facility])   . Diverticulitis   . GERD (gastroesophageal reflux disease)   . Hyperlipidemia   . Hypertension   . Osteoarthritis   . Osteoporosis    actonel  . Positive PPD    s/p INH (2006)  . Rheumatoid arthritis(714.0)    MTX transaminitis, Leflunomide (rash), enbrel, plaquinil, prednisone, remicade, Imuran (transaminitis)  . Valvular heart disease    moderate MR and TR    HOSPITAL COURSE:  HPI: The patient with past medical history of rheumatoid arthritis, hypertension, hyperlipidemia, valvular heart disease and diverticulitis presents to the emergency department complaining of vomiting and abdominal pain.  The patient states that she has had diarrhea for the last 4 days but began to have acute vomiting abdominal pain this evening.  She denies blood in her stool or emesis.  CT of her abdomen showed possible mild colitis.  The patient was started on Flagyl and given intravenous fluid.  Laboratory evaluation also showed elevated troponin.  The patient has denied chest pain or shortness of breath.  Once the patient was more comfortable the emergency department staff calling the hospitalist service for admission.     1. -Acute colitis;  abdomen is nonsurgical; no blood in stool.  Patient clinically improved with Ciprofloxacin and Flagyl  advanced diet as tolerated Lactic acid is up indicating dehydration.   Lactic acid 2.5-2.1Hydrated with intravenous fluid. Encourage p.o. intake.    2. Atrial fibrillation with RVR Cardizem IV as needed and patient is started on Cardizem 30 mg by mouth every 6 hours,which is discontinued Amiodarone infusion improved the heart rhythm and patient will be discharged with amiodarone 400 mg by mouth once daily for 5 days  followed by 200 mg once daily [facility] cardio f/u  Patient is  on aspirin 81 mg , not on NOAC  will defer to cardiology Elevated troponin: Likely secondary to demand ischemia  Monitor telemetry.   Cardiac biomarkers are not trending  patient is a symptomatic from cardiac standpoint  3. Hypertension: Controlled; continue metoprolol  4. Hyponatremia/hypokalemia: Secondary to hypovolemia. Encouraged p.o. intake as well as hydrated with intravenous fluid.  5. Rheumatoid arthritis: Continue Plaquenil  6. Asthma: Controlled; continue inhaled corticosteroid. Albuterol as needed.  7. Hyperlipidemia: Continue statin therapy  8. GI prophylaxis: Pantoprazole   9.DVT prophylaxis: Lovenox    DISCHARGE CONDITIONS:   STABLE  CONSULTS OBTAINED:  Treatment Team:  Teodoro Spray, MD Dionisio David, MD   PROCEDURES none   DRUG ALLERGIES:   Allergies  Allergen Reactions  . Astelin [Azelastine Hcl] Other (See Comments)    Reaction:  Unknown   . Codeine Other (See Comments)    Reaction:  Altered mental status  . Dilaudid [Hydromorphone] Other (See Comments)    Reaction:  Unknown   . Flexeril [Cyclobenzaprine] Other (See Comments)    Reaction:  Unknown   . Imuran [Azathioprine] Other (See Comments)  Reaction:  Abnormal liver function  . Lisinopril Itching  . Lyrica [Pregabalin] Other (See Comments)    Reaction:  Sore gums   . Methotrexate Derivatives Other (See Comments)    Reaction:  Abnormal liver function  . Amoxicillin Rash and Other (See Comments)    Unable to obtain enough information to answer additional  questions about this medication.    Jolee Ewing [Leflunomide] Rash  . Clindamycin/Lincomycin Rash  . Doxycycline Rash  . Lodine [Etodolac] Rash  . Percocet [Oxycodone-Acetaminophen] Rash  . Sulfa Antibiotics Rash    DISCHARGE MEDICATIONS:   Allergies as of 06/26/2017      Reactions   Astelin [azelastine Hcl] Other (See Comments)   Reaction:  Unknown    Codeine Other (See Comments)   Reaction:  Altered mental status   Dilaudid [hydromorphone] Other (See Comments)   Reaction:  Unknown    Flexeril [cyclobenzaprine] Other (See Comments)   Reaction:  Unknown    Imuran [azathioprine] Other (See Comments)   Reaction:  Abnormal liver function   Lisinopril Itching   Lyrica [pregabalin] Other (See Comments)   Reaction:  Sore gums    Methotrexate Derivatives Other (See Comments)   Reaction:  Abnormal liver function   Amoxicillin Rash, Other (See Comments)   Unable to obtain enough information to answer additional questions about this medication.     Arava [leflunomide] Rash   Clindamycin/lincomycin Rash   Doxycycline Rash   Lodine [etodolac] Rash   Percocet [oxycodone-acetaminophen] Rash   Sulfa Antibiotics Rash      Medication List    STOP taking these medications   FIRST-DUKES MOUTHWASH Susp     TAKE these medications   acetaminophen 325 MG tablet Commonly known as:  TYLENOL Take 2 tablets (650 mg total) by mouth every 6 (six) hours as needed for mild pain (or Fever >/= 101).   acidophilus Caps capsule Take 1 capsule by mouth daily.   ADVAIR DISKUS 250-50 MCG/DOSE Aepb Generic drug:  Fluticasone-Salmeterol USE 1 INHALATION EVERY 12 HOURS   albuterol 108 (90 Base) MCG/ACT inhaler Commonly known as:  PROAIR HFA USE 2 INHALATIONS EVERY 6 HOURS AS NEEDED FOR WHEEZING OR SHORTNESS OF BREATH   alendronate 70 MG tablet Commonly known as:  FOSAMAX Take 70 mg by mouth once a week.   ALPRAZolam 0.25 MG tablet Commonly known as:  XANAX Take 1 tablet (0.25 mg total) by mouth  daily as needed for anxiety.   amiodarone 400 MG tablet Commonly known as:  PACERONE Take 1 tablet (400 mg total) by mouth daily. Take 400 mg by mouth once daily for 5 days followed by 200 mg once daily Start taking on:  06/27/2017   aspirin EC 81 MG tablet Take 81 mg by mouth daily.   CALCIUM 600+D 600-400 MG-UNIT tablet Generic drug:  Calcium Carbonate-Vitamin D Take 1 tablet by mouth 2 (two) times daily.   cetirizine 10 MG tablet Commonly known as:  ZYRTEC TAKE 1 TABLET DAILY   cholecalciferol 1000 units tablet Commonly known as:  VITAMIN D Take 1,000 Units by mouth daily.   ciprofloxacin 500 MG tablet Commonly known as:  CIPRO Take 1 tablet (500 mg total) by mouth 2 (two) times daily for 10 days.   colestipol 1 g tablet Commonly known as:  COLESTID Take 2 g by mouth daily.   esomeprazole 20 MG capsule Commonly known as:  NEXIUM Take 20 mg by mouth daily at 12 noon.   fluticasone 50 MCG/ACT nasal spray Commonly known as:  FLONASE Place 2 sprays into both nostrils daily. What changed:  when to take this   furosemide 20 MG tablet Commonly known as:  LASIX Take 2 tablets (40 mg total) by mouth daily. What changed:    when to take this  reasons to take this   gabapentin 300 MG capsule Commonly known as:  NEURONTIN Take 300 mg by mouth 4 (four) times daily.   guaiFENesin-dextromethorphan 100-10 MG/5ML syrup Commonly known as:  ROBITUSSIN DM Take 5 mLs by mouth every 4 (four) hours as needed for cough.   hydroxychloroquine 200 MG tablet Commonly known as:  PLAQUENIL Take 200 mg by mouth 2 (two) times daily.   lip balm Oint Apply 1 application topically as needed for lip care.   meclizine 25 MG tablet Commonly known as:  ANTIVERT Take 1 tablet (25 mg total) by mouth 3 (three) times daily as needed for dizziness.   metoprolol succinate 25 MG 24 hr tablet Commonly known as:  TOPROL-XL Take 1 tablet (25 mg total) by mouth daily. Start taking on:   06/27/2017 What changed:    medication strength  how much to take  additional instructions   metroNIDAZOLE 500 MG tablet Commonly known as:  FLAGYL Take 1 tablet (500 mg total) by mouth 3 (three) times daily for 10 days.   multivitamin with minerals Tabs tablet Take 1 tablet by mouth daily.   ondansetron 4 MG disintegrating tablet Commonly known as:  ZOFRAN ODT Take 1 tablet (4 mg total) by mouth every 8 (eight) hours as needed for nausea or vomiting.   potassium chloride 10 MEQ CR capsule Commonly known as:  KLOR-CON SPRINKLE TAKE 2 CAPSULES Once a day   predniSONE 5 MG tablet Commonly known as:  DELTASONE Take 1 tablet (5 mg total) by mouth daily with breakfast.   simvastatin 20 MG tablet Commonly known as:  ZOCOR TAKE 1 TABLET DAILY   sodium chloride 0.65 % nasal spray Commonly known as:  OCEAN Place 1 spray into the nose as needed for congestion.   SPIRIVA HANDIHALER 18 MCG inhalation capsule Generic drug:  tiotropium INHALE THE CONTENTS OF 1 CAPSULE DAILY   TANDEM PLUS 162-115.2-1 MG Caps TAKE 1 CAPSULE DAILY   traMADol 50 MG tablet Commonly known as:  ULTRAM Take 50 mg by mouth 2 (two) times daily as needed for moderate pain.        DISCHARGE INSTRUCTIONS:   Follow-up with primary care physician in a week Follow-up with cardiology Dr. Ubaldo Glassing in a week  DIET:  Cardiac diet  DISCHARGE CONDITION:  Stable  ACTIVITY:  Activity as tolerated  OXYGEN:  Home Oxygen: No.   Oxygen Delivery: room air  DISCHARGE LOCATION:  home   If you experience worsening of your admission symptoms, develop shortness of breath, life threatening emergency, suicidal or homicidal thoughts you must seek medical attention immediately by calling 911 or calling your MD immediately  if symptoms less severe.  You Must read complete instructions/literature along with all the possible adverse reactions/side effects for all the Medicines you take and that have been prescribed  to you. Take any new Medicines after you have completely understood and accpet all the possible adverse reactions/side effects.   Please note  You were cared for by a hospitalist during your hospital stay. If you have any questions about your discharge medications or the care you received while you were in the hospital after you are discharged, you can call the unit and asked to speak with the  hospitalist on call if the hospitalist that took care of you is not available. Once you are discharged, your primary care physician will handle any further medical issues. Please note that NO REFILLS for any discharge medications will be authorized once you are discharged, as it is imperative that you return to your primary care physician (or establish a relationship with a primary care physician if you do not have one) for your aftercare needs so that they can reassess your need for medications and monitor your lab values.     Today  Chief Complaint  Patient presents with  . Emesis   Patient's heart rate is well controlled. Denies any abdominal pain. Denies any nausea vomiting or diarrhea. Tolerating diet and wants to go home. Patient is ambulating to the bathroom without any difficulty  ROS:  CONSTITUTIONAL: Denies fevers, chills. Denies any fatigue, weakness.  EYES: Denies blurry vision, double vision, eye pain. EARS, NOSE, THROAT: Denies tinnitus, ear pain, hearing loss. RESPIRATORY: Denies cough, wheeze, shortness of breath.  CARDIOVASCULAR: Denies chest pain, palpitations, edema.  GASTROINTESTINAL: Denies nausea, vomiting, diarrhea, abdominal pain. Denies bright red blood per rectum. GENITOURINARY: Denies dysuria, hematuria. ENDOCRINE: Denies nocturia or thyroid problems. HEMATOLOGIC AND LYMPHATIC: Denies easy bruising or bleeding. SKIN: Denies rash or lesion. MUSCULOSKELETAL: Denies pain in neck, back, shoulder, knees, hips or arthritic symptoms.  NEUROLOGIC: Denies paralysis, paresthesias.   PSYCHIATRIC: Denies anxiety or depressive symptoms.   VITAL SIGNS:  Blood pressure (!) 143/61, pulse (!) 57, temperature 98 F (36.7 C), temperature source Oral, resp. rate 18, height 5\' 4"  (1.626 m), weight 74.2 kg (163 lb 8 oz), SpO2 97 %.  I/O:    Intake/Output Summary (Last 24 hours) at 06/26/2017 1225 Last data filed at 06/26/2017 1213 Gross per 24 hour  Intake 3376.15 ml  Output 3200 ml  Net 176.15 ml    PHYSICAL EXAMINATION:  GENERAL:  76 y.o.-year-old patient lying in the bed with no acute distress.  EYES: Pupils equal, round, reactive to light and accommodation. No scleral icterus. Extraocular muscles intact.  HEENT: Head atraumatic, normocephalic. Oropharynx and nasopharynx clear.  NECK:  Supple, no jugular venous distention. No thyroid enlargement, no tenderness.  LUNGS: Normal breath sounds bilaterally, no wheezing, rales,rhonchi or crepitation. No use of accessory muscles of respiration.  CARDIOVASCULAR: S1, S2 normal. No murmurs, rubs, or gallops.  ABDOMEN: Soft, non-tender, non-distended. Bowel sounds present. No organomegaly or mass.  EXTREMITIES: No pedal edema, cyanosis, or clubbing.  NEUROLOGIC: Cranial nerves II through XII are intact. Muscle strength 5/5 in all extremities. Sensation intact. Gait not checked.  PSYCHIATRIC: The patient is alert and oriented x 3.  SKIN: No obvious rash, lesion, or ulcer.   DATA REVIEW:   CBC Recent Labs  Lab 06/26/17 0538  WBC 5.7  HGB 10.5*  HCT 30.9*  PLT 186    Chemistries  Recent Labs  Lab 06/24/17 0006  06/26/17 0538  NA 131*   < > 136  K 3.7   < > 4.1  CL 95*   < > 108  CO2 20*   < > 22  GLUCOSE 115*   < > 109*  BUN 10   < > 8  CREATININE 0.80   < > 0.92  CALCIUM 9.1   < > 8.3*  MG  --    < > 2.1  AST 49*  --   --   ALT 25  --   --   ALKPHOS 44  --   --  BILITOT 1.3*  --   --    < > = values in this interval not displayed.    Cardiac Enzymes Recent Labs  Lab 06/24/17 2047  TROPONINI 0.08*     Microbiology Results  Results for orders placed or performed during the hospital encounter of 06/23/17  Blood Culture (routine x 2)     Status: None (Preliminary result)   Collection Time: 06/24/17  2:58 AM  Result Value Ref Range Status   Specimen Description BLOOD RIGHT ANTECUBITAL  Final   Special Requests   Final    BOTTLES DRAWN AEROBIC AND ANAEROBIC Blood Culture adequate volume   Culture NO GROWTH 2 DAYS  Final   Report Status PENDING  Incomplete  Blood Culture (routine x 2)     Status: None (Preliminary result)   Collection Time: 06/24/17  2:58 AM  Result Value Ref Range Status   Specimen Description BLOOD RIGHT FOREARM  Final   Special Requests   Final    BOTTLES DRAWN AEROBIC AND ANAEROBIC Blood Culture adequate volume   Culture NO GROWTH 2 DAYS  Final   Report Status PENDING  Incomplete    RADIOLOGY:  Ct Abdomen Pelvis W Contrast  Result Date: 06/24/2017 CLINICAL DATA:  Sudden onset of abdominal pain. EXAM: CT ABDOMEN AND PELVIS WITH CONTRAST TECHNIQUE: Multidetector CT imaging of the abdomen and pelvis was performed using the standard protocol following bolus administration of intravenous contrast. CONTRAST:  139mL ISOVUE-300 IOPAMIDOL (ISOVUE-300) INJECTION 61% COMPARISON:  Abdominal ultrasound 06/12/2017.  CT 06/19/2016 FINDINGS: Lower chest: Mild cardiomegaly. No pleural fluid or consolidation. Moderate hiatal hernia with minimal fluid in the intrathoracic stomach. Hepatobiliary: Decreased hepatic density consistent with steatosis. Postcholecystectomy with clips in the gallbladder fossa. Stable dilatation of the common bile duct post cholecystectomy measuring 10 mm distally. Pancreas: No ductal dilatation or inflammation. 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 Spleen is normal in size. Adrenals/Urinary Tract: Normal adrenal glands. No hydronephrosis. Homogeneous renal enhancement with symmetric excretion on delayed  phase imaging. Symmetric mild bilateral perinephric edema is unchanged from prior exam. Urinary bladder is distended without wall thickening. Stomach/Bowel: Moderate hiatal hernia. Small amount of fluid in the intrathoracic and subdiaphragmatic stomach mild distal gastric wall thickening. No small bowel obstruction or inflammation. Majority of the colon is decompressed. Possible wall thickening involving the descending and proximal sigmoid colon. Descending and sigmoid diverticulosis without acute diverticulitis. Normal appendix. Vascular/Lymphatic: Scattered aortic atherosclerosis. No aneurysm. Prominent right inguinal nodes measuring up to 10 mm, likely reactive. No enlarged abdominal lymph nodes. Reproductive: Status post hysterectomy. No adnexal masses. Other: No free air, free fluid, or intra-abdominal fluid collection. Small fat containing umbilical hernia. Musculoskeletal: Bones are under mineralized with multilevel degenerative change in the spine. IMPRESSION: 1. Equivocal colonic wall thickening of the descending and proximal sigmoid colon versus nondistention. Possible mild colitis. 2. Moderate hiatal hernia with some intraluminal fluid, as well as mild distal gastric wall thickening, nonspecific but can be seen with gastritis. 3. Hepatic steatosis. Chronic biliary prominence postcholecystectomy. Electronically Signed   By: Jeb Levering M.D.   On: 06/24/2017 03:20    EKG:   Orders placed or performed during the hospital encounter of 06/23/17  . ED EKG  . ED EKG  . EKG 12-Lead  . EKG 12-Lead      Management plans discussed with the patient, family and they are in agreement.  CODE STATUS:     Code Status Orders  (From admission, onward)  Start     Ordered   06/24/17 0656  Full code  Continuous     06/24/17 0655    Code Status History    Date Active Date Inactive Code Status Order ID Comments User Context   12/22/2016 17:25 12/23/2016 16:40 Partial Code [id number]  Hillary Bow, MD ED   12/22/2016 17:10 12/22/2016 17:25 Full Code [id number]  Hillary Bow, MD ED   10/31/2016 17:57 11/02/2016 15:19 Full Code [id number]  Demetrios Loll, MD Inpatient   06/19/2016 13:20 06/21/2016 15:29 DNR [id number]  Loletha Grayer, MD Inpatient   06/17/2016 02:01 06/19/2016 13:20 Full Code [id number]  Saundra Shelling, MD ED   08/18/2015 16:28 08/22/2015 16:15 Full Code [id number]  Lytle Butte, MD ED   08/03/2015 09:49 08/07/2015 14:11 Full Code [id number]  Saundra Shelling, MD Inpatient      TOTAL TIME TAKING CARE OF THIS PATIENT: 45  minutes.   Note: This dictation was prepared with Dragon dictation along with smaller phrase technology. Any transcriptional errors that result from this process are unintentional.   @[facility] @  on 06/26/2017 at 12:25 PM  Between 7am to 6pm - Pager - [phone]  After 6pm go to [email or web address] - password [email or web address] [facility]  Office  [phone]  CC: Primary care physician; [name], MD

## 2017-06-26 NOTE — Progress Notes (Signed)
SUBJECTIVE: Feeling much better   Vitals:   06/25/17 1940 06/26/17 0415 06/26/17 0519 06/26/17 0735  BP: 139/60  (!) 146/60 (!) 143/61  Pulse: 60  (!) 55 (!) 57  Resp: 18  20 18   Temp: 98 F (36.7 C)  98 F (36.7 C) 98 F (36.7 C)  TempSrc:   Oral Oral  SpO2: 97%  97% 97%  Weight:  163 lb 8 oz (74.2 kg)    Height:        Intake/Output Summary (Last 24 hours) at 06/26/2017 0955 Last data filed at 06/26/2017 0947 Gross per 24 hour  Intake 3035.73 ml  Output 3500 ml  Net -464.27 ml    LABS: Basic Metabolic Panel: Recent Labs    06/25/17 0505 06/25/17 0726 06/26/17 0538  NA 130*  --  136  K 3.1*  --  4.1  CL 100*  --  108  CO2 22  --  22  GLUCOSE 87  --  109*  BUN 11  --  8  CREATININE 0.91  --  0.92  CALCIUM 8.1*  --  8.3*  MG  --  1.6* 2.1   Liver Function Tests: Recent Labs    06/24/17 0006  AST 49*  ALT 25  ALKPHOS 44  BILITOT 1.3*  PROT 7.8  ALBUMIN 4.4   Recent Labs    06/24/17 0006  LIPASE 47   CBC: Recent Labs    06/25/17 0505 06/26/17 0538  WBC 7.9 5.7  HGB 10.1* 10.5*  HCT 29.6* 30.9*  MCV 95.0 96.4  PLT 193 186   Cardiac Enzymes: Recent Labs    06/24/17 1508 06/24/17 1847 06/24/17 2047  TROPONINI 0.09* 0.09* 0.08*   BNP: Invalid input(s): POCBNP D-Dimer: No results for input(s): DDIMER in the last 72 hours. Hemoglobin A1C: No results for input(s): HGBA1C in the last 72 hours. Fasting Lipid Panel: No results for input(s): CHOL, HDL, LDLCALC, TRIG, CHOLHDL, LDLDIRECT in the last 72 hours. Thyroid Function Tests: Recent Labs    06/24/17 0726  TSH 0.914   Anemia Panel: No results for input(s): VITAMINB12, FOLATE, FERRITIN, TIBC, IRON, RETICCTPCT in the last 72 hours.   PHYSICAL EXAM General: Well developed, well nourished, in no acute distress HEENT:  Normocephalic and atramatic Neck:  No JVD.  Lungs: Clear bilaterally to auscultation and percussion. Heart: HRRR . Normal S1 and S2 without gallops or murmurs.   Abdomen: Bowel sounds are positive, abdomen soft and non-tender  Msk:  Back normal, normal gait. Normal strength and tone for age. Extremities: No clubbing, cyanosis or edema.   Neuro: Alert and oriented X 3. Psych:  Good affect, responds appropriately  TELEMETRY: NSR 64/min ASSESSMENT AND PLAN: Atrial fibrillation with RVR, now in NSR. Continue po amiodrone 400 once a day for 5 days then switch to 200 once a day and f/u Dr. Ubaldo Glassing. Active Problems:   Colitis    Kaitlyn David, MD, [facility] 06/26/2017 9:55 AM

## 2017-06-26 NOTE — Progress Notes (Signed)
Patient is discharge home in a stable condition, summary and f/u care given to both patient and daughter , verbalized understanding .

## 2017-06-28 ENCOUNTER — Telehealth: Payer: Self-pay | Admitting: Internal Medicine

## 2017-06-28 NOTE — Telephone Encounter (Signed)
Second attempt made for TCM left message for patient to return call to office first attempt 06/27/17 second attempt 06/28/17. Needs HFU with PCP and needs Transitional Care Management.

## 2017-06-29 LAB — CULTURE, BLOOD (ROUTINE X 2)
CULTURE: NO GROWTH
CULTURE: NO GROWTH
SPECIAL REQUESTS: ADEQUATE
Special Requests: ADEQUATE

## 2017-06-29 NOTE — Telephone Encounter (Signed)
Transition Care Management Follow-up Telephone Call  How have you been since you were released from the hospital? Patient feeling better just feels nervous    Do you understand why you were in the hospital? yes   Do you understand the discharge instrcutions? yes  Items Reviewed:  Medications reviewed: yes  Allergies reviewed: yes  Dietary changes reviewed: yes  Referrals reviewed: yes   Functional Questionnaire:   Activities of Daily Living (ADLs):   She states they are independent in the following: ambulation, bathing and hygiene, feeding, continence, grooming, toileting and dressing States they require assistance with the following: No assistance required   Any transportation issues/concerns?: no   Any patient concerns? no   Confirmed importance and date/time of follow-up visits scheduled: yes   Confirmed with patient if condition begins to worsen call PCP or go to the ER.  Patient was given the Call-a-Nurse line [phone]: yes

## 2017-06-29 NOTE — Telephone Encounter (Signed)
Can you hold for cancellation.  (I have already sent message for two to be worked in on 07/12/17 (at 12:30 and 1:00).  Would like to not add to this if another open slot becomes available.

## 2017-06-30 NOTE — Telephone Encounter (Signed)
Pt scheduled 07/17/17 @3pm 

## 2017-07-05 ENCOUNTER — Telehealth: Payer: Self-pay | Admitting: Internal Medicine

## 2017-07-05 NOTE — Telephone Encounter (Signed)
Copied from [id number]. Topic: Inquiry >> Jul 05, 2017  4:44 PM Kaitlyn Huff wrote: Reason for CRM: pt called to ask for [facility], contact pt if needed

## 2017-07-06 ENCOUNTER — Telehealth: Payer: Self-pay

## 2017-07-06 MED ORDER — ALPRAZOLAM 0.25 MG PO TABS: 0.2500 mg | ORAL_TABLET | Freq: Every day | ORAL | 0 refills | Status: DC | PRN

## 2017-07-06 NOTE — Telephone Encounter (Signed)
90 day refill authorized and printed

## 2017-07-06 NOTE — Addendum Note (Signed)
Addended by: Crecencio Mc on: 07/06/2017 12:31 PM   Modules accepted: Orders

## 2017-07-06 NOTE — Telephone Encounter (Signed)
,  Refill request for Xanax, last seen [id number], last filled 1OCT2018.  Please advise.  Patient usually receives 90 day supply so we can send to [facility].

## 2017-07-06 NOTE — Telephone Encounter (Signed)
Printed, signed and faxed.  

## 2017-07-09 ENCOUNTER — Other Ambulatory Visit: Payer: Self-pay | Admitting: Internal Medicine

## 2017-07-14 ENCOUNTER — Other Ambulatory Visit (INDEPENDENT_AMBULATORY_CARE_PROVIDER_SITE_OTHER): Payer: Medicare Other

## 2017-07-14 ENCOUNTER — Ambulatory Visit (INDEPENDENT_AMBULATORY_CARE_PROVIDER_SITE_OTHER): Payer: Medicare Other

## 2017-07-14 VITALS — BP 122/70 | HR 60 | Temp 98.0°F | Resp 15 | Ht 64.0 in | Wt 166.0 lb

## 2017-07-14 DIAGNOSIS — Z0000 Encounter for general adult medical examination without abnormal findings: Secondary | ICD-10-CM

## 2017-07-14 DIAGNOSIS — I10 Essential (primary) hypertension: Secondary | ICD-10-CM

## 2017-07-14 DIAGNOSIS — E7800 Pure hypercholesterolemia, unspecified: Secondary | ICD-10-CM | POA: Diagnosis not present

## 2017-07-14 DIAGNOSIS — E871 Hypo-osmolality and hyponatremia: Secondary | ICD-10-CM

## 2017-07-14 DIAGNOSIS — Z1331 Encounter for screening for depression: Secondary | ICD-10-CM | POA: Diagnosis not present

## 2017-07-14 LAB — LIPID PANEL
CHOL/HDL RATIO: 2
Cholesterol: 142 mg/dL (ref 0–200)
HDL: 79.1 mg/dL (ref >39.00)
LDL Cholesterol: 40 mg/dL (ref 0–99)
NONHDL: 62.66
Triglycerides: 112 mg/dL (ref 0.0–149.0)
VLDL: 22.4 mg/dL (ref 0.0–40.0)

## 2017-07-14 LAB — HEPATIC FUNCTION PANEL
ALBUMIN: 4 g/dL (ref 3.5–5.2)
ALK PHOS: 30 U/L — AB (ref 39–117)
ALT: 21 U/L (ref 0–35)
AST: 32 U/L (ref 0–37)
Bilirubin, Direct: 0.1 mg/dL (ref 0.0–0.3)
Total Bilirubin: 0.5 mg/dL (ref 0.2–1.2)
Total Protein: 6.8 g/dL (ref 6.0–8.3)

## 2017-07-14 LAB — BASIC METABOLIC PANEL
BUN: 12 mg/dL (ref 6–23)
CALCIUM: 9.2 mg/dL (ref 8.4–10.5)
CO2: 30 meq/L (ref 19–32)
Chloride: 99 mEq/L (ref 96–112)
Creatinine, Ser: 1.03 mg/dL (ref 0.40–1.20)
GFR: 55.64 mL/min — ABNORMAL LOW (ref >60.00)
GLUCOSE: 83 mg/dL (ref 70–99)
Potassium: 4.4 mEq/L (ref 3.5–5.1)
SODIUM: 137 meq/L (ref 135–145)

## 2017-07-14 NOTE — Progress Notes (Signed)
Subjective:   Kaitlyn Huff is a 77 y.o. female who presents for Medicare Annual (Subsequent) preventive examination.  Review of Systems:  No ROS.  Medicare Wellness Visit. Additional risk factors are reflected in the social history.  Cardiac Risk Factors include: advanced age (>75men, >27 women)     Objective:     Vitals: BP 122/70 (BP Location: Left Arm, Patient Position: Sitting, Cuff Size: Normal)   Pulse 60   Temp 98 F (36.7 C) (Oral)   Resp 15   Ht 5\' 4"  (1.626 m)   Wt 166 lb (75.3 kg)   SpO2 95%   BMI 28.49 kg/m   Body mass index is 28.49 kg/m.  Advanced Directives 07/14/2017 06/23/2017 05/11/2017 12/22/2016 12/22/2016 12/22/2016 12/22/2016  Does Patient Have a Medical Advance Directive? Yes No Yes Yes - Yes No  Type of [employer] [place];Living will Healthcare Power of [place] -  Does patient want to make changes to medical advance directive? No - Patient declined - - No - Patient declined - - -  Copy of [place] in Chart? Yes - - - Yes No - copy requested -  Would patient like information on creating a medical advance directive? - No - Patient declined - - No - Patient declined - No - Patient declined    Tobacco Social History   Tobacco Use  Smoking Status Never Smoker  Smokeless Tobacco Never Used     Counseling given: Not Answered   Clinical Intake:                       Past Medical History:  Diagnosis Date  . Anemia   . Cancer ([place])   . Diverticulitis   . GERD (gastroesophageal reflux disease)   . Hyperlipidemia   . Hypertension   . Osteoarthritis   . Osteoporosis    actonel  . Positive PPD    s/p INH (2006)  . Rheumatoid arthritis(714.0)    MTX transaminitis, Leflunomide (rash), enbrel, plaquinil, prednisone, remicade, Imuran (transaminitis)  . Valvular heart disease    moderate MR and TR    Past Surgical History:  Procedure Laterality Date  . ABDOMINAL HYSTERECTOMY    . CERVICAL CONE BIOPSY    . CHOLECYSTECTOMY  06/22/14  . COLONOSCOPY WITH PROPOFOL N/A 07/09/2015   Procedure: COLONOSCOPY WITH PROPOFOL;  Surgeon: Manya Silvas, MD;  Location: [facility] ENDOSCOPY;  Service: Endoscopy;  Laterality: N/A;  . ESOPHAGOGASTRODUODENOSCOPY (EGD) WITH PROPOFOL N/A 07/09/2015   Procedure: ESOPHAGOGASTRODUODENOSCOPY (EGD) WITH PROPOFOL;  Surgeon: Manya Silvas, MD;  Location[facility] ENDOSCOPY;  Service: Endoscopy;  Laterality: N/A;  . OVARY SURGERY    . TRACHEOSTOMY  1959  . TUBAL LIGATION     Family History  Problem Relation Age of Onset  . Heart disease Father        MI  . Heart disease Mother   . Valvular heart disease Mother   . Breast cancer Sister 51  . Cancer Sister        Lung cancer  . COPD Sister   . Heart disease Sister   . Colon cancer Neg Hx    Social History   Socioeconomic History  . Marital status: Widowed    Spouse name: None  . Number of children: 4  . Years of education: None  . Highest education level: None  Social Needs  .  Financial resource strain: None  . Food insecurity - worry: None  . Food insecurity - inability: None  . Transportation needs - medical: None  . Transportation needs - non-medical: None  Occupational History  . Occupation: retired  Tobacco Use  . Smoking status: Never Smoker  . Smokeless tobacco: Never Used  Substance and Sexual Activity  . Alcohol use: No    Alcohol/week: 0.0 oz  . Drug use: No  . Sexual activity: No  Other Topics Concern  . None  Social History Narrative   Lives with family at home    Outpatient Encounter Medications as of 07/14/2017  Medication Sig  . acetaminophen (TYLENOL) 325 MG tablet Take 2 tablets (650 mg total) by mouth every 6 (six) hours as needed for mild pain (or Fever >/= 101).  Marland Kitchen acidophilus (RISAQUAD) CAPS capsule Take 1 capsule by mouth daily.  Marland Kitchen ADVAIR DISKUS 250-50 MCG/DOSE AEPB  USE 1 INHALATION EVERY 12 HOURS  . albuterol (PROAIR HFA) 108 (90 Base) MCG/ACT inhaler USE 2 INHALATIONS EVERY 6 HOURS AS NEEDED FOR WHEEZING OR SHORTNESS OF BREATH  . alendronate (FOSAMAX) 70 MG tablet Take 70 mg by mouth once a week.   . ALPRAZolam (XANAX) 0.25 MG tablet Take 1 tablet (0.25 mg total) by mouth daily as needed for anxiety.  Marland Kitchen amiodarone (PACERONE) 400 MG tablet Take 1 tablet (400 mg total) by mouth daily. Take 400 mg by mouth once daily for 5 days followed by 200 mg once daily  . aspirin EC 81 MG tablet Take 81 mg by mouth daily.  . Calcium Carbonate-Vitamin D (CALCIUM 600+D) 600-400 MG-UNIT tablet Take 1 tablet by mouth 2 (two) times daily.   . cetirizine (ZYRTEC) 10 MG tablet TAKE 1 TABLET DAILY  . cholecalciferol (VITAMIN D) 1000 units tablet Take 1,000 Units by mouth daily.  . colestipol (COLESTID) 1 g tablet Take 2 g by mouth daily.   Marland Kitchen esomeprazole (NEXIUM) 20 MG capsule Take 20 mg by mouth daily at 12 noon.  Marland Kitchen FeFum-FePo-FA-B Cmp-C-Zn-Mn-Cu (TANDEM PLUS) 162-115.2-1 MG CAPS TAKE 1 CAPSULE DAILY  . fluticasone (FLONASE) 50 MCG/ACT nasal spray Place 2 sprays into both nostrils daily. (Patient taking differently: Place 2 sprays into both nostrils 2 (two) times daily. )  . furosemide (LASIX) 20 MG tablet Take 2 tablets (40 mg total) by mouth daily. (Patient taking differently: Take 40 mg by mouth daily as needed for fluid or edema. )  . gabapentin (NEURONTIN) 300 MG capsule Take 300 mg by mouth 4 (four) times daily.   Marland Kitchen guaiFENesin-dextromethorphan (ROBITUSSIN DM) 100-10 MG/5ML syrup Take 5 mLs by mouth every 4 (four) hours as needed for cough.  . hydroxychloroquine (PLAQUENIL) 200 MG tablet Take 200 mg by mouth 2 (two) times daily.   Marland Kitchen lip balm (BLISTEX) OINT Apply 1 application topically as needed for lip care.  . meclizine (ANTIVERT) 25 MG tablet Take 1 tablet (25 mg total) by mouth 3 (three) times daily as needed for dizziness.  . metoprolol succinate (TOPROL-XL) 25 MG 24  hr tablet Take 1 tablet (25 mg total) by mouth daily.  . Multiple Vitamin (MULTIVITAMIN WITH MINERALS) TABS tablet Take 1 tablet by mouth daily.  . ondansetron (ZOFRAN ODT) 4 MG disintegrating tablet Take 1 tablet (4 mg total) by mouth every 8 (eight) hours as needed for nausea or vomiting.  . potassium chloride (KLOR-CON SPRINKLE) 10 MEQ CR capsule TAKE 2 CAPSULES Once a day  . predniSONE (DELTASONE) 5 MG tablet Take 1 tablet (5  mg total) by mouth daily with breakfast.  . simvastatin (ZOCOR) 20 MG tablet TAKE 1 TABLET DAILY  . sodium chloride (OCEAN) 0.65 % nasal spray Place 1 spray into the nose as needed for congestion.  Marland Kitchen SPIRIVA HANDIHALER 18 MCG inhalation capsule INHALE THE CONTENTS OF 1 CAPSULE DAILY  . traMADol (ULTRAM) 50 MG tablet Take 50 mg by mouth 2 (two) times daily as needed for moderate pain.   No facility-administered encounter medications on file as of 07/14/2017.     Activities of Daily Living In your present state of health, do you have any difficulty performing the following activities: 07/14/2017 06/24/2017  Hearing? N N  Vision? N N  Difficulty concentrating or making decisions? - Y  Walking or climbing stairs? Y N  Comment SOB on exacerbation -  Dressing or bathing? N N  Doing errands, shopping? N N  Preparing Food and eating ? N -  Using the Toilet? N -  In the past six months, have you accidently leaked urine? N -  Do you have problems with loss of bowel control? N -  Managing your Medications? N -  Managing your Finances? N -  Housekeeping or managing your Housekeeping? N -  Some recent data might be hidden    Patient Care Team: [name], MD as PCP - General (Internal Medicine)    Assessment:   This is a routine wellness examination for Kamorie. The goal of the wellness visit is to assist the patient how to close the gaps in care and create a preventative care plan for the patient.   The roster of all physicians providing medical care to patient  is listed in the Snapshot section of the chart.  Taking calcium VIT D as appropriate/Osteoporosis reviewed.    Safety issues reviewed; Smoke and carbon monoxide detectors in the home. No firearms in the home.  Wears seatbelts when driving or riding with others. Patient does wear sunscreen or protective clothing when in direct sunlight. No violence in the home.  Depression- PHQ 2 &9 complete.  No signs/symptoms or verbal communication regarding little pleasure in doing things, feeling down, depressed or hopeless. No changes in sleeping, energy, eating, concentrating.  No thoughts of self harm or harm towards others.  Time spent on this topic is 12 minutes.   Patient is alert, normal appearance, oriented to person/place/and time. Correctly identified the president of the [place], recall of 3/3 words, and performing simple calculations. Displays appropriate judgement and can read correct time from watch face.   No new identified risk were noted.  No failures at ADL's or IADL's.    BMI- discussed the importance of a healthy diet, water intake and the benefits of aerobic exercise. Educational material provided.   24 hour diet recall: Breakfast:  crackers Lunch: deli sandwich Dinner: ribs, macaroni, potatoes  Daily fluid intake: 3 cups of caffeine, 8 cups of water  Dental- every 6 months.  Dr Phillis Knack.  Eye- Visual acuity not assessed per patient preference since they have regular follow up with the ophthalmologist.  Wears corrective lenses.  Sleep patterns- Sleeps 5-6 hours at night. Naps as needed.  CPAP not in use.  Health maintenance gaps- closed.  Patient Concerns: None at this time. Follow up with PCP as needed.  Exercise Activities and Dietary recommendations Current Exercise Habits: The patient does not participate in regular exercise at present  Goals    . Increase physical activity     Walk for exercise  Fall Risk Fall Risk  07/14/2017 06/06/2016 05/12/2016  03/21/2016 04/14/2015  Falls in the past year? No No No No No  Number falls in past yr: - - - - -  Injury with Fall? - - - - -  Risk for fall due to : - - - - -  Risk for fall due to: Comment - - - - -   Depression Screen PHQ 2/9 Scores 07/14/2017 06/06/2016 05/12/2016 03/21/2016  PHQ - 2 Score 0 0 0 0  PHQ- 9 Score - - - -     Cognitive Function MMSE - Mini Mental State Exam 05/12/2016  Orientation to time 5  Orientation to Place 5  Registration 3  Attention/ Calculation 5  Recall 3  Language- name 2 objects 2  Language- repeat 1  Language- follow 3 step command 3  Language- read & follow direction 1  Write a sentence 1  Copy design 1  Total score 30     6CIT Screen 07/14/2017  What Year? 0 points  What month? 0 points  What time? 0 points  Count back from 20 0 points  Months in reverse 0 points  Repeat phrase 0 points  Total Score 0    Immunization History  Administered Date(s) Administered  . Influenza Split 05/17/2012, 03/03/2014  . Influenza, High Dose Seasonal PF 02/25/2017  . Influenza,inj,Quad PF,6+ Mos 03/25/2013, 08/05/2015  . Influenza-Unspecified 03/25/2013  . Pneumococcal Polysaccharide-23 02/24/2017    Screening Tests Health Maintenance  Topic Date Due  . PNA vac Low Risk Adult (2 of 2 - PCV13) 02/24/2018  . TETANUS/TDAP  01/20/2019  . MAMMOGRAM  03/08/2019  . COLONOSCOPY  07/08/2020  . INFLUENZA VACCINE  Completed  . DEXA SCAN  Completed      Plan:    End of life planning; Advance aging; Advanced directives discussed. Copy of current HCPOA/Living Will on file.    I have personally reviewed and noted the following in the patient's chart:   . Medical and social history . Use of alcohol, tobacco or illicit drugs  . Current medications and supplements . Functional ability and status . Nutritional status . Physical activity . Advanced directives . List of other physicians . Hospitalizations, surgeries, and ER visits in previous 12  months . Vitals . Screenings to include cognitive, depression, and falls . Referrals and appointments  In addition, I have reviewed and discussed with patient certain preventive protocols, quality metrics, and best practice recommendations. A written personalized care plan for preventive services as well as general preventive health recommendations were provided to patient.     Varney Biles, LPN  09/10/9922   Reviewed above information.  Agree with assessment and plan.    Dr Nicki Reaper

## 2017-07-14 NOTE — Patient Instructions (Addendum)
  Kaitlyn Huff , Thank you for taking time to come for your Medicare Wellness Visit. I appreciate your ongoing commitment to your health goals. Please review the following plan we discussed and let me know if I can assist you in the future.   Follow up with Dr. Nicki Reaper as needed.    Have a great day!  These are the goals we discussed: Goals    . Increase physical activity     Walk for exercise       This is a list of the screening recommended for you and due dates:  Health Maintenance  Topic Date Due  . Pneumonia vaccines (2 of 2 - PCV13) 02/24/2018  . Tetanus Vaccine  01/20/2019  . Mammogram  03/08/2019  . Colon Cancer Screening  07/08/2020  . Flu Shot  Completed  . DEXA scan (bone density measurement)  Completed

## 2017-07-17 ENCOUNTER — Ambulatory Visit (INDEPENDENT_AMBULATORY_CARE_PROVIDER_SITE_OTHER): Payer: Medicare Other | Admitting: Internal Medicine

## 2017-07-17 ENCOUNTER — Encounter: Payer: Self-pay | Admitting: Internal Medicine

## 2017-07-17 DIAGNOSIS — E7800 Pure hypercholesterolemia, unspecified: Secondary | ICD-10-CM

## 2017-07-17 DIAGNOSIS — D649 Anemia, unspecified: Secondary | ICD-10-CM | POA: Diagnosis not present

## 2017-07-17 DIAGNOSIS — R197 Diarrhea, unspecified: Secondary | ICD-10-CM

## 2017-07-17 DIAGNOSIS — Z860100 Personal history of colon polyps, unspecified: Secondary | ICD-10-CM

## 2017-07-17 DIAGNOSIS — M069 Rheumatoid arthritis, unspecified: Secondary | ICD-10-CM

## 2017-07-17 DIAGNOSIS — I4891 Unspecified atrial fibrillation: Secondary | ICD-10-CM

## 2017-07-17 DIAGNOSIS — K219 Gastro-esophageal reflux disease without esophagitis: Secondary | ICD-10-CM

## 2017-07-17 DIAGNOSIS — I10 Essential (primary) hypertension: Secondary | ICD-10-CM | POA: Diagnosis not present

## 2017-07-17 DIAGNOSIS — K529 Noninfective gastroenteritis and colitis, unspecified: Secondary | ICD-10-CM | POA: Diagnosis not present

## 2017-07-17 NOTE — Patient Instructions (Signed)
-  Decrease metoprolol to 1/2 tablet per day

## 2017-07-17 NOTE — Progress Notes (Signed)
Pre visit review using our clinic review tool, if applicable. No additional management support is needed unless otherwise documented below in the visit note. 

## 2017-07-17 NOTE — Progress Notes (Signed)
Patient ID: Kaitlyn Huff, female   DOB: Jan 08, 1941, 77 y.o.   MRN: [id number]   Subjective:    Patient ID: Kaitlyn Huff, female    DOB: 08/02/1940, 77 y.o.   MRN: [id number]  HPI  Patient here for hospital follow up.  Admitted 06/23/17 with acute colitis.  Treated with cipro and flagyl.  Feels better.  Still with loose stool.  Taking her colestid.  Eating.  No vomiting.  No abdominal pain.  Discussed f/u with GI.  She is agreeable.  No chest pain.  While in the hospital - afib with RVR.  Placed on IV cardizem.  started on amiodarone.  In SR now.  Heart rate low.  She is taking metoprolol. Not on cardizem now.  Reports occasional palpitations.  No increased heart rate.  No urine change.  Doing better regarding increased anxiety.  States during the above, had to take a couple extra xanax.  Improved now.     Past Medical History:  Diagnosis Date  . Anemia   . Cancer ([place])   . Diverticulitis   . GERD (gastroesophageal reflux disease)   . Hyperlipidemia   . Hypertension   . Osteoarthritis   . Osteoporosis    actonel  . Positive PPD    s/p INH (2006)  . Rheumatoid arthritis(714.0)    MTX transaminitis, Leflunomide (rash), enbrel, plaquinil, prednisone, remicade, Imuran (transaminitis)  . 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 ABDOMINAL HYSTERECTOMY    . CERVICAL CONE BIOPSY    . CHOLECYSTECTOMY  06/22/14  . COLONOSCOPY WITH PROPOFOL N/A 07/09/2015   Procedure: COLONOSCOPY WITH PROPOFOL;  Surgeon: Manya Silvas, MD;  Location[facility] ENDOSCOPY;  Service: Endoscopy;  Laterality: N/A;  . ESOPHAGOGASTRODUODENOSCOPY (EGD) WITH PROPOFOL N/A 07/09/2015   Procedure: ESOPHAGOGASTRODUODENOSCOPY (EGD) WITH PROPOFOL;  Surgeon: Manya Silvas, MD;  Location: [facility] ENDOSCOPY;  Service: Endoscopy;  Laterality: N/A;  . OVARY SURGERY    . TRACHEOSTOMY  1959  . TUBAL LIGATION     Family History  Problem Relation Age of Onset  .  Heart disease Father        MI  . Heart disease Mother   . Valvular heart disease Mother   . Breast cancer Sister 74  . Cancer Sister        Lung cancer  . COPD Sister   . Heart disease Sister   . Colon cancer Neg Hx    Social History   Socioeconomic History  . Marital status: Widowed    Spouse name: None  . Number of children: 4  . Years of education: None  . Highest education level: None  Social Needs  . Financial resource strain: None  . Food insecurity - worry: None  . Food insecurity - inability: None  . Transportation needs - medical: None  . Transportation needs - non-medical: None  Occupational History  . Occupation: retired  Tobacco Use  . Smoking status: Never Smoker  . Smokeless tobacco: Never Used  Substance and Sexual Activity  . Alcohol use: No    Alcohol/week: 0.0 oz  . Drug use: No  . Sexual activity: No  Other Topics Concern  . None  Social History Narrative   Lives with family at home    Outpatient Encounter Medications as of 07/17/2017  Medication Sig  . acetaminophen (TYLENOL) 325 MG tablet Take 2 tablets (650 mg total) by mouth every 6 (six) hours  as needed for mild pain (or Fever >/= 101).  Marland Kitchen acidophilus (RISAQUAD) CAPS capsule Take 1 capsule by mouth daily.  Marland Kitchen ADVAIR DISKUS 250-50 MCG/DOSE AEPB USE 1 INHALATION EVERY 12 HOURS  . albuterol (PROAIR HFA) 108 (90 Base) MCG/ACT inhaler USE 2 INHALATIONS EVERY 6 HOURS AS NEEDED FOR WHEEZING OR SHORTNESS OF BREATH  . alendronate (FOSAMAX) 70 MG tablet Take 70 mg by mouth once a week.   . ALPRAZolam (XANAX) 0.25 MG tablet Take 1 tablet (0.25 mg total) by mouth daily as needed for anxiety.  Marland Kitchen amiodarone (PACERONE) 400 MG tablet Take 1 tablet (400 mg total) by mouth daily. Take 400 mg by mouth once daily for 5 days followed by 200 mg once daily  . aspirin EC 81 MG tablet Take 81 mg by mouth daily.  . Calcium Carbonate-Vitamin D (CALCIUM 600+D) 600-400 MG-UNIT tablet Take 1 tablet by mouth 2 (two) times  daily.   . cetirizine (ZYRTEC) 10 MG tablet TAKE 1 TABLET DAILY  . cholecalciferol (VITAMIN D) 1000 units tablet Take 1,000 Units by mouth daily.  . colestipol (COLESTID) 1 g tablet Take 2 g by mouth daily.   Marland Kitchen esomeprazole (NEXIUM) 20 MG capsule Take 20 mg by mouth daily at 12 noon.  Marland Kitchen FeFum-FePo-FA-B Cmp-C-Zn-Mn-Cu (TANDEM PLUS) 162-115.2-1 MG CAPS TAKE 1 CAPSULE DAILY  . fluticasone (FLONASE) 50 MCG/ACT nasal spray Place 2 sprays into both nostrils daily. (Patient taking differently: Place 2 sprays into both nostrils 2 (two) times daily. )  . furosemide (LASIX) 20 MG tablet Take 2 tablets (40 mg total) by mouth daily. (Patient taking differently: Take 40 mg by mouth daily as needed for fluid or edema. )  . gabapentin (NEURONTIN) 300 MG capsule Take 300 mg by mouth 4 (four) times daily.   Marland Kitchen guaiFENesin-dextromethorphan (ROBITUSSIN DM) 100-10 MG/5ML syrup Take 5 mLs by mouth every 4 (four) hours as needed for cough.  . hydroxychloroquine (PLAQUENIL) 200 MG tablet Take 200 mg by mouth 2 (two) times daily.   Marland Kitchen lip balm (BLISTEX) OINT Apply 1 application topically as needed for lip care.  . meclizine (ANTIVERT) 25 MG tablet Take 1 tablet (25 mg total) by mouth 3 (three) times daily as needed for dizziness.  . metoprolol succinate (TOPROL-XL) 25 MG 24 hr tablet Take 1 tablet (25 mg total) by mouth daily.  . Multiple Vitamin (MULTIVITAMIN WITH MINERALS) TABS tablet Take 1 tablet by mouth daily.  . ondansetron (ZOFRAN ODT) 4 MG disintegrating tablet Take 1 tablet (4 mg total) by mouth every 8 (eight) hours as needed for nausea or vomiting.  . potassium chloride (KLOR-CON SPRINKLE) 10 MEQ CR capsule TAKE 2 CAPSULES Once a day  . predniSONE (DELTASONE) 5 MG tablet Take 1 tablet (5 mg total) by mouth daily with breakfast.  . simvastatin (ZOCOR) 20 MG tablet TAKE 1 TABLET DAILY  . sodium chloride (OCEAN) 0.65 % nasal spray Place 1 spray into the nose as needed for congestion.  Marland Kitchen SPIRIVA HANDIHALER 18 MCG  inhalation capsule INHALE THE CONTENTS OF 1 CAPSULE DAILY  . traMADol (ULTRAM) 50 MG tablet Take 50 mg by mouth 2 (two) times daily as needed for moderate pain.   No facility-administered encounter medications on file as of 07/17/2017.     Review of Systems  Constitutional: Negative for unexpected weight change.       Energy improved.  Appetite improved.   HENT: Negative for congestion and sinus pressure.   Respiratory: Negative for cough, chest tightness and shortness of  breath.   Cardiovascular: Positive for palpitations. Negative for chest pain and leg swelling.  Gastrointestinal: Negative for abdominal pain, nausea and vomiting.       Still with loose stool.   Genitourinary: Negative for difficulty urinating and dysuria.  Musculoskeletal: Negative for myalgias.  Skin: Negative for color change and rash.  Neurological: Negative for dizziness, light-headedness and headaches.  Psychiatric/Behavioral: Negative for agitation and decreased concentration.       Objective:    Physical Exam  Constitutional: She appears well-developed and well-nourished. No distress.  HENT:  Nose: Nose normal.  Mouth/Throat: Oropharynx is clear and moist.  Neck: Neck supple. No thyromegaly present.  Cardiovascular: Regular rhythm.  Pulse 50s.   Pulmonary/Chest: Breath sounds normal. No respiratory distress. She has no wheezes.  Abdominal: Soft. Bowel sounds are normal. There is no tenderness.  Musculoskeletal: She exhibits no edema or tenderness.  Lymphadenopathy:    She has no cervical adenopathy.  Skin: No rash noted. No erythema.  Psychiatric: She has a normal mood and affect. Her behavior is normal.    BP (!) 144/78   Pulse (!) 53   Temp 98.3 F (36.8 C) (Oral)   Ht 5\' 4"  (1.626 m)   Wt 167 lb 12.8 oz (76.1 kg)   SpO2 96%   BMI 28.80 kg/m  Wt Readings from Last 3 Encounters:  07/17/17 167 lb 12.8 oz (76.1 kg)  07/14/17 166 lb (75.3 kg)  06/26/17 163 lb 8 oz (74.2 kg)     Lab  Results  Component Value Date   WBC 5.7 06/26/2017   HGB 10.5 (L) 06/26/2017   HCT 30.9 (L) 06/26/2017   PLT 186 06/26/2017   GLUCOSE 83 07/14/2017   CHOL 142 07/14/2017   TRIG 112.0 07/14/2017   HDL 79.10 07/14/2017   LDLDIRECT 73.7 08/20/2013   LDLCALC 40 07/14/2017   ALT 21 07/14/2017   AST 32 07/14/2017   NA 137 07/14/2017   K 4.4 07/14/2017   CL 99 07/14/2017   CREATININE 1.03 07/14/2017   BUN 12 07/14/2017   CO2 30 07/14/2017   TSH 0.914 06/24/2017   INR 1.28 10/31/2016    Ct Abdomen Pelvis W Contrast  Result Date: 06/24/2017 CLINICAL DATA:  Sudden onset of abdominal pain. EXAM: CT ABDOMEN AND PELVIS WITH CONTRAST TECHNIQUE: Multidetector CT imaging of the abdomen and pelvis was performed using the standard protocol following bolus administration of intravenous contrast. CONTRAST:  134mL ISOVUE-300 IOPAMIDOL (ISOVUE-300) INJECTION 61% COMPARISON:  Abdominal ultrasound 06/12/2017.  CT 06/19/2016 FINDINGS: Lower chest: Mild cardiomegaly. No pleural fluid or consolidation. Moderate hiatal hernia with minimal fluid in the intrathoracic stomach. Hepatobiliary: Decreased hepatic density consistent with steatosis. Postcholecystectomy with clips in the gallbladder fossa. Stable dilatation of the common bile duct post cholecystectomy measuring 10 mm distally. Pancreas: No ductal dilatation or inflammation. 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 Spleen is normal in size. Adrenals/Urinary Tract: Normal adrenal glands. No hydronephrosis. Homogeneous renal enhancement with symmetric excretion on delayed phase imaging. Symmetric mild bilateral perinephric edema is unchanged from prior exam. Urinary bladder is distended without wall thickening. Stomach/Bowel: Moderate hiatal hernia. Small amount of fluid in the intrathoracic and subdiaphragmatic stomach mild distal gastric wall thickening. No small bowel obstruction or inflammation. Majority  of the colon is decompressed. Possible wall thickening involving the descending and proximal sigmoid colon. Descending and sigmoid diverticulosis without acute diverticulitis. Normal appendix. Vascular/Lymphatic: Scattered aortic atherosclerosis. No aneurysm. Prominent right inguinal nodes measuring up to 10 mm,  likely reactive. No enlarged abdominal lymph nodes. Reproductive: Status post hysterectomy. No adnexal masses. Other: No free air, free fluid, or intra-abdominal fluid collection. Small fat containing umbilical hernia. Musculoskeletal: Bones are under mineralized with multilevel degenerative change in the spine. IMPRESSION: 1. Equivocal colonic wall thickening of the descending and proximal sigmoid colon versus nondistention. Possible mild colitis. 2. Moderate hiatal hernia with some intraluminal fluid, as well as mild distal gastric wall thickening, nonspecific but can be seen with gastritis. 3. Hepatic steatosis. Chronic biliary prominence postcholecystectomy. Electronically Signed   By: Jeb Levering M.D.   On: 06/24/2017 03:20       Assessment & Plan:   Problem List Items Addressed This Visit    A-fib ([place])    In SR.  On amiodarone.  Pulse rate in 50s.  On metoprolol.  Decreased to 1/2 tablet per day.  Keep f/u with cardiology.        Anemia    Follow cbc.        Colitis    Recently admitted.  Treated with cipro and flagyl.  Doing better.  Still with loose stool.  F/u with GI.        Relevant Orders   Ambulatory referral to Gastroenterology   Diarrhea    Persistent intermittent flares.  Recently admitted and treated for colitis. CT as outlined.  Mild distal gastric wall thickening.  Also with colonic wall thickening.  Treated with cipro and flagyl.  Still with loose stool.  Refer back to GI for evaluation.         Relevant Orders   Ambulatory referral to Gastroenterology   GERD (gastroesophageal reflux disease)    Controlled on current regimen.  Follow.       History of  colonic polyps    Colonoscopy 07/09/15 as outlined.  Recommended f/u colonoscopy 06/2020.        Hypercholesterolemia    On simvastatin.  Low cholesterol diet and exercise.  Follow lipid panel and liver function tests.        Hypertension    Blood pressure on recheck improved.  Still slightly increased.  Has been doing ok.  Follow pressures.  Follow metabolic panel.       Rheumatoid arthritis ([place])    Followed by Dr [name].  Stable.           [name], MD

## 2017-07-20 ENCOUNTER — Encounter: Payer: Self-pay | Admitting: Internal Medicine

## 2017-07-20 NOTE — Assessment & Plan Note (Signed)
Colonoscopy 07/09/15 as outlined.  Recommended f/u colonoscopy 06/2020.

## 2017-07-20 NOTE — Assessment & Plan Note (Signed)
In SR.  On amiodarone.  Pulse rate in 50s.  On metoprolol.  Decreased to 1/2 tablet per day.  Keep f/u with cardiology.

## 2017-07-20 NOTE — Assessment & Plan Note (Signed)
Controlled on current regimen.  Follow.  

## 2017-07-20 NOTE — Assessment & Plan Note (Signed)
Persistent intermittent flares.  Recently admitted and treated for colitis. CT as outlined.  Mild distal gastric wall thickening.  Also with colonic wall thickening.  Treated with cipro and flagyl.  Still with loose stool.  Refer back to GI for evaluation.

## 2017-07-20 NOTE — Assessment & Plan Note (Signed)
Followed by Dr Kernodle.  Stable.  

## 2017-07-20 NOTE — Assessment & Plan Note (Signed)
On simvastatin.  Low cholesterol diet and exercise.  Follow lipid panel and liver function tests.   

## 2017-07-20 NOTE — Assessment & Plan Note (Signed)
Recently admitted.  Treated with cipro and flagyl.  Doing better.  Still with loose stool.  F/u with GI.

## 2017-07-20 NOTE — Assessment & Plan Note (Signed)
Blood pressure on recheck improved.  Still slightly increased.  Has been doing ok.  Follow pressures.  Follow metabolic panel.

## 2017-07-20 NOTE — Assessment & Plan Note (Signed)
Follow cbc.  

## 2017-07-22 ENCOUNTER — Other Ambulatory Visit: Payer: Self-pay | Admitting: Internal Medicine

## 2017-07-24 ENCOUNTER — Other Ambulatory Visit: Payer: Medicare Other

## 2017-07-25 DIAGNOSIS — M0579 Rheumatoid arthritis with rheumatoid factor of multiple sites without organ or systems involvement: Secondary | ICD-10-CM | POA: Diagnosis not present

## 2017-07-28 ENCOUNTER — Encounter: Payer: Medicare Other | Admitting: Internal Medicine

## 2017-08-04 ENCOUNTER — Other Ambulatory Visit: Payer: Self-pay | Admitting: Internal Medicine

## 2017-08-04 NOTE — Telephone Encounter (Signed)
Pt requesting  A  Refill   Of   Metoprolol     She  Was  Prescribed  It  On  06/27/2017     While  In hospital  By dr  Nicholes Mango     Last  Office    Visit    Was   07/17/2017  By  Dr  Nicki Reaper     Pharmacy  Of  Choice  [facility]

## 2017-08-04 NOTE — Telephone Encounter (Signed)
Copied from [place] [id number]. Topic: General - Other >> Aug 04, 2017  2:08 PM Darl Householder, RMA wrote: Reason for CRM: Medication refill request for Metroprolol 25 mg to be [facility]

## 2017-08-04 NOTE — Telephone Encounter (Signed)
OK to fill

## 2017-08-04 NOTE — Telephone Encounter (Signed)
Medication filled by Hospitalist during 06/27/17 admission and pt would like to continue.

## 2017-08-06 MED ORDER — METOPROLOL SUCCINATE ER 25 MG PO TB24: 25.0000 mg | ORAL_TABLET | Freq: Every day | ORAL | 0 refills | Status: DC

## 2017-08-06 NOTE — Telephone Encounter (Signed)
Per our last visit, she was on metoprolol.  Per note, on 25mg  metoprolol.  Need to clarify, because was instructed to take 1/2 tablet at our last visit.  Need to confirm dose she is taking.

## 2017-08-07 NOTE — Telephone Encounter (Signed)
LMTCB

## 2017-08-08 DIAGNOSIS — I480 Paroxysmal atrial fibrillation: Secondary | ICD-10-CM | POA: Diagnosis not present

## 2017-08-08 DIAGNOSIS — I10 Essential (primary) hypertension: Secondary | ICD-10-CM | POA: Diagnosis not present

## 2017-08-08 DIAGNOSIS — E782 Mixed hyperlipidemia: Secondary | ICD-10-CM | POA: Diagnosis not present

## 2017-08-08 DIAGNOSIS — I059 Rheumatic mitral valve disease, unspecified: Secondary | ICD-10-CM | POA: Diagnosis not present

## 2017-08-08 NOTE — Telephone Encounter (Signed)
Advised patient per last office note she should be taking 25mg   Spoke with patient she states she is taking metoprolol 50 mg 1/2 tablet daily. Patient states she would like script faxed to [facility].   Darrick Meigs, she faxed script to [facility].

## 2017-08-24 DIAGNOSIS — K219 Gastro-esophageal reflux disease without esophagitis: Secondary | ICD-10-CM | POA: Diagnosis not present

## 2017-08-24 DIAGNOSIS — K529 Noninfective gastroenteritis and colitis, unspecified: Secondary | ICD-10-CM | POA: Diagnosis not present

## 2017-08-24 DIAGNOSIS — K2950 Unspecified chronic gastritis without bleeding: Secondary | ICD-10-CM | POA: Diagnosis not present

## 2017-08-24 DIAGNOSIS — K760 Fatty (change of) liver, not elsewhere classified: Secondary | ICD-10-CM | POA: Diagnosis not present

## 2017-08-25 ENCOUNTER — Other Ambulatory Visit: Payer: Self-pay | Admitting: Internal Medicine

## 2017-09-05 ENCOUNTER — Ambulatory Visit (INDEPENDENT_AMBULATORY_CARE_PROVIDER_SITE_OTHER): Payer: Medicare Other | Admitting: Internal Medicine

## 2017-09-05 ENCOUNTER — Ambulatory Visit: Payer: Self-pay | Admitting: *Deleted

## 2017-09-05 ENCOUNTER — Encounter: Payer: Self-pay | Admitting: Internal Medicine

## 2017-09-05 VITALS — BP 138/80 | HR 68 | Temp 98.3°F | Wt 167.0 lb

## 2017-09-05 DIAGNOSIS — I872 Venous insufficiency (chronic) (peripheral): Secondary | ICD-10-CM

## 2017-09-05 NOTE — Patient Instructions (Signed)

## 2017-09-05 NOTE — Progress Notes (Signed)
Subjective:    Patient ID: Kaitlyn Huff, female    DOB: 09/16/1940, 77 y.o.   MRN: [id number]  HPI  Pt presents to the clinic today with c/o BLE, L>R. This has been a chronic issue for her but seems worse 3 days ago. The swelling seems worse in the evening. It is not painful but she reports she has associated numbness and tingling in her feet. She has a history of chronic venous insufficiency, and takes Furosemide PRN. It is prescribed 40 mg daily prn, she reports she only takes 20 mg daily prn. She does consume some salt. She does not elevate her legs. She is on Gabapentin for neuropathic pain, which may be contributing to her swelling. She is unable to wear TEDs. She has had her veins stripped in the past.  Review of Systems  Past Medical History:  Diagnosis Date  . Anemia   . Cancer ([place])   . Diverticulitis   . GERD (gastroesophageal reflux disease)   . Hyperlipidemia   . Hypertension   . Osteoarthritis   . Osteoporosis    actonel  . Positive PPD    s/p INH (2006)  . Rheumatoid arthritis(714.0)    MTX transaminitis, Leflunomide (rash), enbrel, plaquinil, prednisone, remicade, Imuran (transaminitis)  . Valvular heart disease    moderate MR and TR    Current Outpatient Medications  Medication Sig Dispense Refill  . acetaminophen (TYLENOL) 325 MG tablet Take 2 tablets (650 mg total) by mouth every 6 (six) hours as needed for mild pain (or Fever >/= 101).    Marland Kitchen acidophilus (RISAQUAD) CAPS capsule Take 1 capsule by mouth daily.    Marland Kitchen ADVAIR DISKUS 250-50 MCG/DOSE AEPB USE 1 INHALATION EVERY 12 HOURS 180 each 0  . albuterol (PROAIR HFA) 108 (90 Base) MCG/ACT inhaler USE 2 INHALATIONS EVERY 6 HOURS AS NEEDED FOR WHEEZING OR SHORTNESS OF BREATH 54 g 1  . alendronate (FOSAMAX) 70 MG tablet Take 70 mg by mouth once a week.     . ALPRAZolam (XANAX) 0.25 MG tablet Take 1 tablet (0.25 mg total) by mouth daily as needed for anxiety. 90 tablet 0  . amiodarone (PACERONE) 400 MG tablet Take  1 tablet (400 mg total) by mouth daily. Take 400 mg by mouth once daily for 5 days followed by 200 mg once daily 45 tablet 0  . aspirin EC 81 MG tablet Take 81 mg by mouth daily.    . Calcium Carbonate-Vitamin D (CALCIUM 600+D) 600-400 MG-UNIT tablet Take 1 tablet by mouth 2 (two) times daily.     . cetirizine (ZYRTEC) 10 MG tablet TAKE 1 TABLET DAILY 90 tablet 1  . cholecalciferol (VITAMIN D) 1000 units tablet Take 1,000 Units by mouth daily.    . colestipol (COLESTID) 1 g tablet Take 2 g by mouth daily.     Marland Kitchen esomeprazole (NEXIUM) 20 MG capsule Take 20 mg by mouth daily at 12 noon.    Marland Kitchen FeFum-FePo-FA-B Cmp-C-Zn-Mn-Cu (TANDEM PLUS) 162-115.2-1 MG CAPS TAKE 1 CAPSULE DAILY 90 each 1  . fluticasone (FLONASE) 50 MCG/ACT nasal spray Place 2 sprays into both nostrils daily. (Patient taking differently: Place 2 sprays into both nostrils 2 (two) times daily. ) 16 g 6  . furosemide (LASIX) 20 MG tablet Take 2 tablets (40 mg total) by mouth daily. (Patient taking differently: Take 40 mg by mouth daily as needed for fluid or edema. )    . gabapentin (NEURONTIN) 300 MG capsule Take 300 mg by mouth 4 (  four) times daily.     Marland Kitchen guaiFENesin-dextromethorphan (ROBITUSSIN DM) 100-10 MG/5ML syrup Take 5 mLs by mouth every 4 (four) hours as needed for cough. 118 mL 0  . hydroxychloroquine (PLAQUENIL) 200 MG tablet Take 200 mg by mouth 2 (two) times daily.     Marland Kitchen lip balm (BLISTEX) OINT Apply 1 application topically as needed for lip care. 1 Tube 0  . meclizine (ANTIVERT) 25 MG tablet Take 1 tablet (25 mg total) by mouth 3 (three) times daily as needed for dizziness. 15 tablet 0  . metoprolol succinate (TOPROL-XL) 25 MG 24 hr tablet TAKE 1 TABLET DAILY 30 tablet 0  . Multiple Vitamin (MULTIVITAMIN WITH MINERALS) TABS tablet Take 1 tablet by mouth daily.    . ondansetron (ZOFRAN ODT) 4 MG disintegrating tablet Take 1 tablet (4 mg total) by mouth every 8 (eight) hours as needed for nausea or vomiting. 20 tablet 0  .  potassium chloride (KLOR-CON SPRINKLE) 10 MEQ CR capsule TAKE 2 CAPSULES Once a day 360 capsule 3  . predniSONE (DELTASONE) 5 MG tablet Take 1 tablet (5 mg total) by mouth daily with breakfast.    . simvastatin (ZOCOR) 20 MG tablet TAKE 1 TABLET DAILY 90 tablet 1  . sodium chloride (OCEAN) 0.65 % nasal spray Place 1 spray into the nose as needed for congestion.    Marland Kitchen SPIRIVA HANDIHALER 18 MCG inhalation capsule INHALE THE CONTENTS OF 1 CAPSULE DAILY 90 capsule 1  . traMADol (ULTRAM) 50 MG tablet Take 50 mg by mouth 2 (two) times daily as needed for moderate pain.     No current facility-administered medications for this visit.     Allergies  Allergen Reactions  . Astelin [Azelastine Hcl] Other (See Comments)    Reaction:  Unknown   . Codeine Other (See Comments)    Reaction:  Altered mental status  . Dilaudid [Hydromorphone] Other (See Comments)    Reaction:  Unknown   . Flexeril [Cyclobenzaprine] Other (See Comments)    Reaction:  Unknown   . Imuran [Azathioprine] Other (See Comments)    Reaction:  Abnormal liver function  . Lisinopril Itching  . Lyrica [Pregabalin] Other (See Comments)    Reaction:  Sore gums   . Methotrexate Derivatives Other (See Comments)    Reaction:  Abnormal liver function  . Amoxicillin Rash and Other (See Comments)    Unable to obtain enough information to answer additional questions about this medication.    Jolee Ewing [Leflunomide] Rash  . Clindamycin/Lincomycin Rash  . Doxycycline Rash  . Lodine [Etodolac] Rash  . Percocet [Oxycodone-Acetaminophen] Rash  . Sulfa Antibiotics Rash    Family History  Problem Relation Age of Onset  . Heart disease Father        MI  . Heart disease Mother   . Valvular heart disease Mother   . Breast cancer Sister 65  . Cancer Sister        Lung cancer  . COPD Sister   . Heart disease Sister   . Colon cancer Neg Hx     Social History   Socioeconomic History  . Marital status: Widowed    Spouse name: Not on  file  . Number of children: 4  . Years of education: Not on file  . Highest education level: Not on file  Social Needs  . Financial resource strain: Not on file  . Food insecurity - worry: Not on file  . Food insecurity - inability: Not on file  . Transportation  needs - medical: Not on file  . Transportation needs - non-medical: Not on file  Occupational History  . Occupation: retired  Tobacco Use  . Smoking status: Never Smoker  . Smokeless tobacco: Never Used  Substance and Sexual Activity  . Alcohol use: No    Alcohol/week: 0.0 oz  . Drug use: No  . Sexual activity: No  Other Topics Concern  . Not on file  Social History Narrative   Lives with family at home     Constitutional: Denies fever, malaise, fatigue, headache or abrupt weight changes.  Respiratory: Denies difficulty breathing, shortness of breath, cough or sputum production.   Cardiovascular: Pt reports BLE edema. Denies chest pain, chest tightness, palpitations or swelling in the hands.  .  No other specific complaints in a complete review of systems (except as listed in HPI above).     Objective:   Physical Exam  BP 138/80   Pulse 68   Temp 98.3 F (36.8 C) (Oral)   Wt 167 lb (75.8 kg)   SpO2 97%   BMI 28.67 kg/m  Wt Readings from Last 3 Encounters:  09/05/17 167 lb (75.8 kg)  07/17/17 167 lb 12.8 oz (76.1 kg)  07/14/17 166 lb (75.3 kg)    General: Appears her stated age, well developed, well nourished in NAD. Neck:  No JVD. Cardiovascular: 1+ pitting edema RLE. 2+ pitting edema LLE. No redness or warmth noted.  Pedal pulses 2+ bilaterally. Cap refill 3-4 seconds BLE. Respiratory: No crackles noted.  BMET    Component Value Date/Time   NA 137 07/14/2017 0914   NA 140 11/03/2014 0441   K 4.4 07/14/2017 0914   K 3.7 11/03/2014 0441   CL 99 07/14/2017 0914   CL 108 11/03/2014 0441   CO2 30 07/14/2017 0914   CO2 23 11/03/2014 0441   GLUCOSE 83 07/14/2017 0914   GLUCOSE 133 (H) 11/03/2014  0441   BUN 12 07/14/2017 0914   BUN 16 11/03/2014 0441   CREATININE 1.03 07/14/2017 0914   CREATININE 1.35 (H) 12/18/2015 1603   CALCIUM 9.2 07/14/2017 0914   CALCIUM 7.6 (L) 11/03/2014 0441   GFRNONAA 60 (L) 06/26/2017 0538   GFRNONAA >60 11/03/2014 0441   GFRAA >60 06/26/2017 0538   GFRAA >60 11/03/2014 0441    Lipid Panel     Component Value Date/Time   CHOL 142 07/14/2017 0914   TRIG 112.0 07/14/2017 0914   HDL 79.10 07/14/2017 0914   CHOLHDL 2 07/14/2017 0914   VLDL 22.4 07/14/2017 0914   LDLCALC 40 07/14/2017 0914    CBC    Component Value Date/Time   WBC 5.7 06/26/2017 0538   RBC 3.21 (L) 06/26/2017 0538   HGB 10.5 (L) 06/26/2017 0538   HGB 9.2 (L) 11/03/2014 0441   HCT 30.9 (L) 06/26/2017 0538   HCT 27.7 (L) 11/03/2014 0441   PLT 186 06/26/2017 0538   PLT 142 (L) 11/03/2014 0441   MCV 96.4 06/26/2017 0538   MCV 94 11/03/2014 0441   MCH 32.8 06/26/2017 0538   MCHC 34.0 06/26/2017 0538   RDW 13.8 06/26/2017 0538   RDW 14.0 11/03/2014 0441   LYMPHSABS 3.1 02/17/2017 0947   LYMPHSABS 0.9 (L) 11/03/2014 0441   MONOABS 0.7 02/17/2017 0947   MONOABS 0.3 11/03/2014 0441   EOSABS 0.2 02/17/2017 0947   EOSABS 0.0 11/03/2014 0441   BASOSABS 0.1 02/17/2017 0947   BASOSABS 0.0 11/03/2014 0441   BASOSABS 0 12/04/2012 1330    Hgb A1C  No results found for: HGBA1C          Assessment & Plan:   CVI:  Advised her to limit salt intake Elevate legs on 2 pillows She reports she will not wear TEDS Encouraged regular activity with adequate rest periods Increase your Furosemide to 2 tabs daily prn as prescribed Gabapentin may be contributing, if not helping neuropathic pain, she may want to discuss this with PCP  Return precautions discussed Webb Silversmith, NP

## 2017-09-05 NOTE — Telephone Encounter (Signed)
Pt is having swelling in both legs. This started today. The left is greater than the right. The swelling on the left goes down to her foot. She denies pain, fever, redness or inability to walk.  She has a hx of RA and has swelling and pain with it. She states that this is different.. Per protocol, appointment made in 24 hours with a provide at [facility] [place] today. Home care advice given to patient with verbal understanding.  Reason for Disposition . [1] MODERATE leg swelling (e.g., swelling extends up to knees) AND [2] new onset or worsening  Answer Assessment - Initial Assessment Questions 1. ONSET: "When did the swelling start?" (e.g., minutes, hours, days)     Started three days 2. LOCATION: "What part of the leg is swollen?"  "Are both legs swollen or just one leg?"     From the knee down and worst in the left leg down to the ankle 3. SEVERITY: "How bad is the swelling?" (e.g., localized; mild, moderate, severe)  - Localized - small area of swelling localized to one leg  - MILD pedal edema - swelling limited to foot and ankle, pitting edema < 1/4 inch (6 mm) deep, rest and elevation eliminate most or all swelling  - MODERATE edema - swelling of lower leg to knee, pitting edema > 1/4 inch (6 mm) deep, rest and elevation only partially reduce swelling  - SEVERE edema - swelling extends above knee, facial or hand swelling present      moderate 4. REDNESS: "Does the swelling look red or infected?"     no 5. PAIN: "Is the swelling painful to touch?" If so, ask: "How painful is it?"   (Scale 1-10; mild, moderate or severe)     no 6. FEVER: "Do you have a fever?" If so, ask: "What is it, how was it measured, and when did it start?"      no 7. CAUSE: "What do you think is causing the leg swelling?"     RA patient, but dose not feel like that 8. MEDICAL HISTORY: "Do you have a history of heart failure, kidney disease, liver failure, or cancer?"     Afib, mitral valve 9. RECURRENT  SYMPTOM: "Have you had leg swelling before?" If so, ask: "When was the last time?" "What happened that time?"     Only with the RA so not like this 10. OTHER SYMPTOMS: "Do you have any other symptoms?" (e.g., chest pain, difficulty breathing)       no 11. PREGNANCY: "Is there any chance you are pregnant?" "When was your last menstrual period?"       no  Protocols used: LEG SWELLING AND EDEMA-A-AH

## 2017-09-14 ENCOUNTER — Other Ambulatory Visit: Payer: Self-pay | Admitting: Internal Medicine

## 2017-09-19 DIAGNOSIS — M0579 Rheumatoid arthritis with rheumatoid factor of multiple sites without organ or systems involvement: Secondary | ICD-10-CM | POA: Diagnosis not present

## 2017-09-19 DIAGNOSIS — M25572 Pain in left ankle and joints of left foot: Secondary | ICD-10-CM | POA: Diagnosis not present

## 2017-09-19 DIAGNOSIS — M7989 Other specified soft tissue disorders: Secondary | ICD-10-CM | POA: Diagnosis not present

## 2017-09-20 ENCOUNTER — Other Ambulatory Visit: Payer: Self-pay | Admitting: Internal Medicine

## 2017-09-20 NOTE — Telephone Encounter (Signed)
Rx refill request: Xanax 0.25  LOV: 07/17/17   PCP: [place]: [facility]

## 2017-09-20 NOTE — Telephone Encounter (Signed)
Copied from [place] [id number]. Topic: Quick Communication - Rx Refill/Question >> Sep 20, 2017 11:32 AM Carolyn Stare wrote: Medication ALPRAZolam [name]) 0.25 MG tablet   Has the patient contacted their pharmacy  yes    Preferred Pharmacy    [facility]   Agent: Please be advised that RX refills may take up to 3 business days. We ask that you follow-up with your pharmacy.

## 2017-09-22 MED ORDER — ALPRAZOLAM 0.25 MG PO TABS: 0.2500 mg | ORAL_TABLET | Freq: Every day | ORAL | 0 refills | Status: DC | PRN

## 2017-09-22 NOTE — Telephone Encounter (Signed)
Ok to fill 

## 2017-09-25 NOTE — Telephone Encounter (Signed)
Faxed

## 2017-10-03 ENCOUNTER — Ambulatory Visit: Payer: Self-pay

## 2017-10-03 NOTE — Telephone Encounter (Signed)
Pt calling to complain of left ankle and foot edema. Pt states sx began 2 weeks ago. No redness or fever. Pt feels fatigued and states right leg is beginning to hurt due to having to bear most of her weight on the right leg when she walks. Denies any pain to the left ankle and foot swelling. Pt was seen 09/05/17 for same issue. No openings with her PCP or office. Appointment made for Thursday (pt stated she cannot be seen tomorrow) with Dr Lorelei Pont at [facility].  Reason for Disposition . [1] MILD swelling of both ankles (i.e., pedal edema) AND [2] new onset or worsening  Answer Assessment - Initial Assessment Questions 1. ONSET: "When did the swelling start?" (e.g., minutes, hours, days)     2 weeks ago 2. LOCATION: "What part of the leg is swollen?"  "Are both legs swollen or just one leg?"     Above ankle down to the foot 3. SEVERITY: "How bad is the swelling?" (e.g., localized; mild, moderate, severe)  - Localized - small area of swelling localized to one leg  - MILD pedal edema - swelling limited to foot and ankle, pitting edema < 1/4 inch (6 mm) deep, rest and elevation eliminate most or all swelling  - MODERATE edema - swelling of lower leg to knee, pitting edema > 1/4 inch (6 mm) deep, rest and elevation only partially reduce swelling  - SEVERE edema - swelling extends above knee, facial or hand swelling present      Localized mild 4. REDNESS: "Does the swelling look red or infected?"     no 5. PAIN: "Is the swelling painful to touch?" If so, ask: "How painful is it?"   (Scale 1-10; mild, moderate or severe)     No tingling 6. FEVER: "Do you have a fever?" If so, ask: "What is it, how was it measured, and when did it start?"      no 7. CAUSE: "What do you think is causing the leg swelling?"     Pt is unsure 8. MEDICAL HISTORY: "Do you have a history of heart failure, kidney disease, liver failure, or cancer?"     A fib  9. RECURRENT SYMPTOM: "Have you had leg swelling before?" If  so, ask: "When was the last time?" "What happened that time?"     Yes (has h/o RA) takes a prednisone taper for edema related to RA 10. OTHER SYMPTOMS: "Do you have any other symptoms?" (e.g., chest pain, difficulty breathing)       Fatigue feels "drained" 11. PREGNANCY: "Is there any chance you are pregnant?" "When was your last menstrual period?"       n/a  Protocols used: LEG SWELLING AND EDEMA-A-AH

## 2017-10-05 ENCOUNTER — Ambulatory Visit: Payer: Medicare Other | Admitting: Family Medicine

## 2017-10-10 ENCOUNTER — Encounter: Payer: Self-pay | Admitting: Internal Medicine

## 2017-10-10 ENCOUNTER — Encounter: Payer: Medicare Other | Admitting: Internal Medicine

## 2017-10-10 ENCOUNTER — Ambulatory Visit (INDEPENDENT_AMBULATORY_CARE_PROVIDER_SITE_OTHER): Payer: Medicare Other | Admitting: Internal Medicine

## 2017-10-10 VITALS — BP 144/78 | HR 51 | Temp 97.8°F | Resp 14 | Ht 64.0 in | Wt 168.0 lb

## 2017-10-10 DIAGNOSIS — E7800 Pure hypercholesterolemia, unspecified: Secondary | ICD-10-CM | POA: Diagnosis not present

## 2017-10-10 DIAGNOSIS — D649 Anemia, unspecified: Secondary | ICD-10-CM

## 2017-10-10 DIAGNOSIS — I4891 Unspecified atrial fibrillation: Secondary | ICD-10-CM | POA: Diagnosis not present

## 2017-10-10 DIAGNOSIS — M069 Rheumatoid arthritis, unspecified: Secondary | ICD-10-CM

## 2017-10-10 DIAGNOSIS — G2581 Restless legs syndrome: Secondary | ICD-10-CM

## 2017-10-10 DIAGNOSIS — I10 Essential (primary) hypertension: Secondary | ICD-10-CM

## 2017-10-10 DIAGNOSIS — R600 Localized edema: Secondary | ICD-10-CM

## 2017-10-10 DIAGNOSIS — K219 Gastro-esophageal reflux disease without esophagitis: Secondary | ICD-10-CM

## 2017-10-10 DIAGNOSIS — I872 Venous insufficiency (chronic) (peripheral): Secondary | ICD-10-CM | POA: Diagnosis not present

## 2017-10-10 DIAGNOSIS — G4733 Obstructive sleep apnea (adult) (pediatric): Secondary | ICD-10-CM

## 2017-10-10 MED ORDER — INTEGRA 62.5-62.5-40-3 MG PO CAPS: ORAL_CAPSULE | ORAL | 3 refills | Status: DC

## 2017-10-10 NOTE — Progress Notes (Signed)
Pre-visit discussion using our clinic review tool. No additional management support is needed unless otherwise documented below in the visit note.  

## 2017-10-10 NOTE — Progress Notes (Signed)
Patient ID: Kaitlyn Huff, female   DOB: 1941/06/04, 77 y.o.   MRN: [id number]   Subjective:    Patient ID: Kaitlyn Huff, female    DOB: May 31, 1941, 77 y.o.   MRN: [id number]  HPI  Patient here for a scheduled follow up.  She reports she has been having increased lower extremity swelling. She saw Dr [name] 09/19/17.  Concern regarding possible posterior tibial tendinitis.  Recommended xray and an ankle brace.  Also recommended support stockings.  She cannot wear.  She comes in today stating that she has noticed increased swelling.  Taking lasix qod.  Was taking daily and has recently cut back.  Concern was on too much.  States swelling extends up to her knee.  She has been sitting with legs elevated.  States she has not been doing a lot of physical activity.  Breathing is stable.  No increased sob.  No chest pain.  Eating and drinking.  Discussed low sodium diet.  No abdominal pain.  Bowels moving.     Past Medical History:  Diagnosis Date  . Anemia   . Cancer ([place])   . Diverticulitis   . GERD (gastroesophageal reflux disease)   . Hyperlipidemia   . Hypertension   . Osteoarthritis   . Osteoporosis    actonel  . Positive PPD    s/p INH (2006)  . Rheumatoid arthritis(714.0)    MTX transaminitis, Leflunomide (rash), enbrel, plaquinil, prednisone, remicade, Imuran (transaminitis)  . 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 ABDOMINAL HYSTERECTOMY    . CERVICAL CONE BIOPSY    . CHOLECYSTECTOMY  06/22/14  . COLONOSCOPY WITH PROPOFOL N/A 07/09/2015   Procedure: COLONOSCOPY WITH PROPOFOL;  Surgeon: Manya Silvas, MD;  Location: [facility] ENDOSCOPY;  Service: Endoscopy;  Laterality: N/A;  . ESOPHAGOGASTRODUODENOSCOPY (EGD) WITH PROPOFOL N/A 07/09/2015   Procedure: ESOPHAGOGASTRODUODENOSCOPY (EGD) WITH PROPOFOL;  Surgeon: Manya Silvas, MD;  Location: [facility] ENDOSCOPY;  Service: Endoscopy;  Laterality: N/A;  . OVARY SURGERY    .  TRACHEOSTOMY  1959  . TUBAL LIGATION     Family History  Problem Relation Age of Onset  . Heart disease Father        MI  . Heart disease Mother   . Valvular heart disease Mother   . Breast cancer Sister 52  . Cancer Sister        Lung cancer  . COPD Sister   . Heart disease Sister   . Colon cancer Neg Hx    Social History   Socioeconomic History  . Marital status: Widowed    Spouse name: Not on file  . Number of children: 4  . Years of education: Not on file  . Highest education level: Not on file  Occupational History  . Occupation: retired  Scientific laboratory technician  . Financial resource strain: Not on file  . Food insecurity:    Worry: Not on file    Inability: Not on file  . Transportation needs:    Medical: Not on file    Non-medical: Not on file  Tobacco Use  . Smoking status: Never Smoker  . Smokeless tobacco: Never Used  Substance and Sexual Activity  . Alcohol use: No    Alcohol/week: 0.0 oz  . Drug use: No  . Sexual activity: Never  Lifestyle  . Physical activity:    Days per week: Not on file    Minutes per session:  Not on file  . Stress: Not on file  Relationships  . Social connections:    Talks on phone: Not on file    Gets together: Not on file    Attends religious service: Not on file    Active member of club or organization: Not on file    Attends meetings of clubs or organizations: Not on file    Relationship status: Not on file  Other Topics Concern  . Not on file  Social History Narrative   Lives with family at home    Outpatient Encounter Medications as of 10/10/2017  Medication Sig  . acetaminophen (TYLENOL) 325 MG tablet Take 2 tablets (650 mg total) by mouth every 6 (six) hours as needed for mild pain (or Fever >/= 101).  Marland Kitchen acidophilus (RISAQUAD) CAPS capsule Take 1 capsule by mouth daily.  Marland Kitchen ADVAIR DISKUS 250-50 MCG/DOSE AEPB USE 1 INHALATION EVERY 12 HOURS  . albuterol (PROAIR HFA) 108 (90 Base) MCG/ACT inhaler USE 2 INHALATIONS EVERY 6  HOURS AS NEEDED FOR WHEEZING OR SHORTNESS OF BREATH  . alendronate (FOSAMAX) 70 MG tablet Take 70 mg by mouth once a week.   . ALPRAZolam (XANAX) 0.25 MG tablet Take 1 tablet (0.25 mg total) by mouth daily as needed for anxiety.  Marland Kitchen amiodarone (PACERONE) 200 MG tablet Take 1 tablet by mouth daily.  Marland Kitchen amiodarone (PACERONE) 400 MG tablet Take 1 tablet (400 mg total) by mouth daily. Take 400 mg by mouth once daily for 5 days followed by 200 mg once daily  . aspirin EC 81 MG tablet Take 81 mg by mouth daily.  . Calcium Carbonate-Vitamin D (CALCIUM 600+D) 600-400 MG-UNIT tablet Take 1 tablet by mouth 2 (two) times daily.   . cetirizine (ZYRTEC) 10 MG tablet TAKE 1 TABLET DAILY  . cholecalciferol (VITAMIN D) 1000 units tablet Take 1,000 Units by mouth daily.  . colestipol (COLESTID) 1 g tablet Take 2 g by mouth daily.   Marland Kitchen esomeprazole (NEXIUM) 20 MG capsule Take 20 mg by mouth daily at 12 noon.  Marland Kitchen FeFum-FePo-FA-B Cmp-C-Zn-Mn-Cu (TANDEM PLUS) 162-115.2-1 MG CAPS TAKE 1 CAPSULE DAILY  . fluticasone (FLONASE) 50 MCG/ACT nasal spray Place 2 sprays into both nostrils daily. (Patient taking differently: Place 2 sprays into both nostrils 2 (two) times daily. )  . furosemide (LASIX) 20 MG tablet Take 2 tablets (40 mg total) by mouth daily. (Patient taking differently: Take 40 mg by mouth daily as needed for fluid or edema. )  . gabapentin (NEURONTIN) 300 MG capsule Take 300 mg by mouth 4 (four) times daily.   Marland Kitchen guaiFENesin-dextromethorphan (ROBITUSSIN DM) 100-10 MG/5ML syrup Take 5 mLs by mouth every 4 (four) hours as needed for cough.  . hydroxychloroquine (PLAQUENIL) 200 MG tablet Take 200 mg by mouth 2 (two) times daily.   Marland Kitchen lip balm (BLISTEX) OINT Apply 1 application topically as needed for lip care.  . meclizine (ANTIVERT) 25 MG tablet Take 1 tablet (25 mg total) by mouth 3 (three) times daily as needed for dizziness.  . metoprolol succinate (TOPROL-XL) 25 MG 24 hr tablet TAKE 1 TABLET DAILY  . Multiple  Vitamin (MULTIVITAMIN WITH MINERALS) TABS tablet Take 1 tablet by mouth daily.  . ondansetron (ZOFRAN ODT) 4 MG disintegrating tablet Take 1 tablet (4 mg total) by mouth every 8 (eight) hours as needed for nausea or vomiting.  . potassium chloride (KLOR-CON SPRINKLE) 10 MEQ CR capsule TAKE 2 CAPSULES Once a day  . predniSONE (DELTASONE) 5 MG tablet Take  1 tablet (5 mg total) by mouth daily with breakfast.  . simvastatin (ZOCOR) 20 MG tablet TAKE 1 TABLET DAILY  . sodium chloride (OCEAN) 0.65 % nasal spray Place 1 spray into the nose as needed for congestion.  Marland Kitchen SPIRIVA HANDIHALER 18 MCG inhalation capsule INHALE THE CONTENTS OF 1 CAPSULE DAILY  . traMADol (ULTRAM) 50 MG tablet Take 50 mg by mouth 2 (two) times daily as needed for moderate pain.  . Fe Fum-FePoly-Vit C-Vit B3 (INTEGRA) 62.5-62.5-40-3 MG CAPS One tablet per day   No facility-administered encounter medications on file as of 10/10/2017.     Review of Systems  Constitutional: Negative for appetite change and unexpected weight change.  HENT: Negative for congestion and sinus pressure.   Respiratory: Negative for cough, chest tightness and shortness of breath.   Cardiovascular: Positive for leg swelling. Negative for chest pain and palpitations.  Gastrointestinal: Negative for abdominal pain, diarrhea, nausea and vomiting.  Genitourinary: Negative for difficulty urinating and dysuria.  Musculoskeletal: Positive for joint swelling. Negative for myalgias.  Skin: Negative for color change and rash.  Neurological: Negative for dizziness, light-headedness and headaches.  Psychiatric/Behavioral: Negative for agitation and dysphoric mood.       Objective:    Physical Exam  Constitutional: She appears well-developed and well-nourished. No distress.  HENT:  Nose: Nose normal.  Mouth/Throat: Oropharynx is clear and moist.  Neck: Neck supple. No thyromegaly present.  Cardiovascular: Normal rate and regular rhythm.  Pulmonary/Chest:  Breath sounds normal. No respiratory distress. She has no wheezes.  Abdominal: Soft. Bowel sounds are normal. There is no tenderness.  Musculoskeletal: She exhibits edema. She exhibits no tenderness.  Lymphadenopathy:    She has no cervical adenopathy.  Skin: No rash noted. No erythema.  Psychiatric: She has a normal mood and affect. Her behavior is normal.    BP (!) 144/78 (BP Location: Left Arm, Patient Position: Sitting, Cuff Size: Normal)   Pulse (!) 51   Temp 97.8 F (36.6 C) (Oral)   Resp 14   Ht 5\' 4"  (1.626 m)   Wt 168 lb (76.2 kg)   SpO2 95%   BMI 28.84 kg/m  Wt Readings from Last 3 Encounters:  10/10/17 168 lb (76.2 kg)  09/05/17 167 lb (75.8 kg)  07/17/17 167 lb 12.8 oz (76.1 kg)     Lab Results  Component Value Date   WBC 7.3 10/10/2017   HGB 12.6 10/10/2017   HCT 37.3 10/10/2017   PLT 199.0 10/10/2017   GLUCOSE 118 (H) 10/10/2017   CHOL 142 07/14/2017   TRIG 112.0 07/14/2017   HDL 79.10 07/14/2017   LDLDIRECT 73.7 08/20/2013   LDLCALC 40 07/14/2017   ALT 38 (H) 10/10/2017   AST 49 (H) 10/10/2017   NA 135 10/10/2017   K 4.4 10/10/2017   CL 97 10/10/2017   CREATININE 1.15 10/10/2017   BUN 14 10/10/2017   CO2 30 10/10/2017   TSH 2.56 10/10/2017   INR 1.28 10/31/2016    Ct Abdomen Pelvis W Contrast  Result Date: 06/24/2017 CLINICAL DATA:  Sudden onset of abdominal pain. EXAM: CT ABDOMEN AND PELVIS WITH CONTRAST TECHNIQUE: Multidetector CT imaging of the abdomen and pelvis was performed using the standard protocol following bolus administration of intravenous contrast. CONTRAST:  122mL ISOVUE-300 IOPAMIDOL (ISOVUE-300) INJECTION 61% COMPARISON:  Abdominal ultrasound 06/12/2017.  CT 06/19/2016 FINDINGS: Lower chest: Mild cardiomegaly. No pleural fluid or consolidation. Moderate hiatal hernia with minimal fluid in the intrathoracic stomach. Hepatobiliary: Decreased hepatic density consistent with steatosis.  Postcholecystectomy with clips in the gallbladder  fossa. Stable dilatation of the common bile duct post cholecystectomy measuring 10 mm distally. Pancreas: No ductal dilatation or inflammation. 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 Spleen is normal in size. Adrenals/Urinary Tract: Normal adrenal glands. No hydronephrosis. Homogeneous renal enhancement with symmetric excretion on delayed phase imaging. Symmetric mild bilateral perinephric edema is unchanged from prior exam. Urinary bladder is distended without wall thickening. Stomach/Bowel: Moderate hiatal hernia. Small amount of fluid in the intrathoracic and subdiaphragmatic stomach mild distal gastric wall thickening. No small bowel obstruction or inflammation. Majority of the colon is decompressed. Possible wall thickening involving the descending and proximal sigmoid colon. Descending and sigmoid diverticulosis without acute diverticulitis. Normal appendix. Vascular/Lymphatic: Scattered aortic atherosclerosis. No aneurysm. Prominent right inguinal nodes measuring up to 10 mm, likely reactive. No enlarged abdominal lymph nodes. Reproductive: Status post hysterectomy. No adnexal masses. Other: No free air, free fluid, or intra-abdominal fluid collection. Small fat containing umbilical hernia. Musculoskeletal: Bones are under mineralized with multilevel degenerative change in the spine. IMPRESSION: 1. Equivocal colonic wall thickening of the descending and proximal sigmoid colon versus nondistention. Possible mild colitis. 2. Moderate hiatal hernia with some intraluminal fluid, as well as mild distal gastric wall thickening, nonspecific but can be seen with gastritis. 3. Hepatic steatosis. Chronic biliary prominence postcholecystectomy. Electronically Signed   By: Jeb Levering M.D.   On: 06/24/2017 03:20       Assessment & Plan:   Problem List Items Addressed This Visit    A-fib ([place]) - Primary    On amiodarone.  Appears to be in SR.  On  metoprolol.  Followed by cardiology.  Appears to be stable.        Anemia    Follow cbc.  Previous decreased hgb.  Check iron stores.        Relevant Medications   Fe Fum-FePoly-Vit C-Vit B3 (INTEGRA) 62.5-62.5-40-3 MG CAPS   Other Relevant Orders   CBC with Differential/Platelet (Completed)   Ferritin (Completed)   Chronic venous insufficiency    Has chronic venous insufficiency.  With increased leg swelling recently.  Continue leg elevation.  Check cbc, liver panel and kidney function to confirm no change.  She does not feel the gabapentin is helping.  Will decreased to bid with plans to taper off if not helping.  May need vascular surgery opinion.        GERD (gastroesophageal reflux disease)    Controlled on current regimen.  Follow.        Hypercholesterolemia    On simvastatin.  Low cholesterol diet and exercise.  Follow lipid panel and liver function tests.        Hypertension    Blood pressure slightly elevated.  Hold on additional medication.  Follow pressures.  Follow metabolic panel.        Relevant Orders   Hepatic function panel (Completed)   TSH (Completed)   Basic metabolic panel (Completed)   Lower extremity edema    Increased swelling recently.  Takes lasix qod.  Not as active. Has been elevating her legs.  Unclear etiology.  Appears to be stable from cardiovascular standpoint.  In SR.  No increased sob.  Will decrease gabapentin as outlined.  Check cbc, metabolic panel and liver function tests to confirm no change.  Unable to wear compression hose.  May need vascular surgery opinion.        Obstructive sleep apnea    Has declined cpap.  Restless leg syndrome    On gabapentin per neurology.  Does not feel helps.  Will decrease to bid and see if any change.  If not helping, will plan to get her off the medication.        Rheumatoid arthritis ([place])    Followed by Dr [name].  See note.  Recently evaluated.            [name], MD

## 2017-10-10 NOTE — Patient Instructions (Signed)
Decrease gabapentin to twice a day.

## 2017-10-11 ENCOUNTER — Telehealth: Payer: Self-pay | Admitting: Internal Medicine

## 2017-10-11 LAB — CBC WITH DIFFERENTIAL/PLATELET
BASOS PCT: 0.8 % (ref 0.0–3.0)
Basophils Absolute: 0.1 10*3/uL (ref 0.0–0.1)
EOS ABS: 0.1 10*3/uL (ref 0.0–0.7)
Eosinophils Relative: 2 % (ref 0.0–5.0)
HCT: 37.3 % (ref 36.0–46.0)
Hemoglobin: 12.6 g/dL (ref 12.0–15.0)
Lymphocytes Relative: 25.1 % (ref 12.0–46.0)
Lymphs Abs: 1.8 10*3/uL (ref 0.7–4.0)
MCHC: 33.7 g/dL (ref 30.0–36.0)
MCV: 93.7 fl (ref 78.0–100.0)
Monocytes Absolute: 0.4 10*3/uL (ref 0.1–1.0)
Monocytes Relative: 6 % (ref 3.0–12.0)
NEUTROS ABS: 4.8 10*3/uL (ref 1.4–7.7)
Neutrophils Relative %: 66.1 % (ref 43.0–77.0)
PLATELETS: 199 10*3/uL (ref 150.0–400.0)
RBC: 3.99 Mil/uL (ref 3.87–5.11)
RDW: 14.2 % (ref 11.5–15.5)
WBC: 7.3 10*3/uL (ref 4.0–10.5)

## 2017-10-11 LAB — BASIC METABOLIC PANEL
BUN: 14 mg/dL (ref 6–23)
CO2: 30 mEq/L (ref 19–32)
Calcium: 9.2 mg/dL (ref 8.4–10.5)
Chloride: 97 mEq/L (ref 96–112)
Creatinine, Ser: 1.15 mg/dL (ref 0.40–1.20)
GFR: 48.96 mL/min — AB (ref >60.00)
Glucose, Bld: 118 mg/dL — ABNORMAL HIGH (ref 70–99)
POTASSIUM: 4.4 meq/L (ref 3.5–5.1)
SODIUM: 135 meq/L (ref 135–145)

## 2017-10-11 LAB — HEPATIC FUNCTION PANEL
ALBUMIN: 4.1 g/dL (ref 3.5–5.2)
ALT: 38 U/L — ABNORMAL HIGH (ref 0–35)
AST: 49 U/L — ABNORMAL HIGH (ref 0–37)
Alkaline Phosphatase: 36 U/L — ABNORMAL LOW (ref 39–117)
BILIRUBIN DIRECT: 0.2 mg/dL (ref 0.0–0.3)
TOTAL PROTEIN: 7.4 g/dL (ref 6.0–8.3)
Total Bilirubin: 0.7 mg/dL (ref 0.2–1.2)

## 2017-10-11 LAB — FERRITIN: FERRITIN: 65.1 ng/mL (ref 10.0–291.0)

## 2017-10-11 LAB — TSH: TSH: 2.56 u[IU]/mL (ref 0.35–4.50)

## 2017-10-11 NOTE — Telephone Encounter (Signed)
Patient stated that she has had increased discomfort in her legs last night and today, patient stated that it feels more like her legs are restless. Patient stated that she took a tramadol today and kept legs elevated and that seemed to help. Denies any acute issues. Advised that you were out of the office this afternoon and would be back tomorrow. Patient stated she is ok to wait until tomorrow but wanted to know if there was something she should do until she sees Dr. Ubaldo Glassing in May.

## 2017-10-11 NOTE — Telephone Encounter (Signed)
Copied from [place] [phone]. Topic: Quick Communication - See Telephone Encounter >> Oct 11, 2017  2:14 PM Robina Ade, Helene Kelp D wrote: CRM for notification. See Telephone encounter for: 10/11/17. Patient called and said she would like to talk to her provider CMA about yesterdays' visit and her leg pain. Please call patient back, thanks.

## 2017-10-12 ENCOUNTER — Encounter: Payer: Self-pay | Admitting: Internal Medicine

## 2017-10-12 NOTE — Assessment & Plan Note (Signed)
On gabapentin per neurology.  Does not feel helps.  Will decrease to bid and see if any change.  If not helping, will plan to get her off the medication.

## 2017-10-12 NOTE — Assessment & Plan Note (Signed)
Controlled on current regimen.  Follow.  

## 2017-10-12 NOTE — Telephone Encounter (Signed)
Pt did decrease gabapentin to BID. She took tramadol last night and slept fine until 830 this morning. Patient stated swelling has gone down and she has been keeping them elevated through out the day. Patient stated referral to vascular surgery was ok if you recommended but she is ok to wait and see if there continues to be persistent symptoms.

## 2017-10-12 NOTE — Assessment & Plan Note (Signed)
Has declined cpap.

## 2017-10-12 NOTE — Assessment & Plan Note (Signed)
Blood pressure slightly elevated.  Hold on additional medication.  Follow pressures.  Follow metabolic panel.

## 2017-10-12 NOTE — Telephone Encounter (Signed)
Ok.  If pt wants to wait.  Just let us know if persistent problems.

## 2017-10-12 NOTE — Assessment & Plan Note (Signed)
On amiodarone.  Appears to be in SR.  On metoprolol.  Followed by cardiology.  Appears to be stable.

## 2017-10-12 NOTE — Assessment & Plan Note (Signed)
Increased swelling recently.  Takes lasix qod.  Not as active. Has been elevating her legs.  Unclear etiology.  Appears to be stable from cardiovascular standpoint.  In SR.  No increased sob.  Will decrease gabapentin as outlined.  Check cbc, metabolic panel and liver function tests to confirm no change.  Unable to wear compression hose.  May need vascular surgery opinion.

## 2017-10-12 NOTE — Assessment & Plan Note (Signed)
Follow cbc.  Previous decreased hgb.  Check iron stores.

## 2017-10-12 NOTE — Assessment & Plan Note (Signed)
On simvastatin.  Low cholesterol diet and exercise.  Follow lipid panel and liver function tests.   

## 2017-10-12 NOTE — Assessment & Plan Note (Signed)
Has chronic venous insufficiency.  With increased leg swelling recently.  Continue leg elevation.  Check cbc, liver panel and kidney function to confirm no change.  She does not feel the gabapentin is helping.  Will decreased to bid with plans to taper off if not helping.  May need vascular surgery opinion.

## 2017-10-12 NOTE — Telephone Encounter (Signed)
Given increased swelling and pain, I would like to refer her to vascular surgery for further evaluation and treatment.  If agreeable, let me know and I will place the order.  Also, at her visit, I had informed her to decrease her gabapentin to bid.  Does she feel this contributed to any increased pain, or did she decrease?  Also, see her result note for her lab results and recs.

## 2017-10-12 NOTE — Assessment & Plan Note (Signed)
Followed by Dr [name].  See note.  Recently evaluated.

## 2017-10-24 ENCOUNTER — Telehealth: Payer: Self-pay | Admitting: Radiology

## 2017-10-24 NOTE — Telephone Encounter (Signed)
Pt coming in for labs Thursday, please place future orders. Thank you.  

## 2017-10-25 ENCOUNTER — Other Ambulatory Visit: Payer: Self-pay | Admitting: Internal Medicine

## 2017-10-25 DIAGNOSIS — R945 Abnormal results of liver function studies: Secondary | ICD-10-CM

## 2017-10-25 DIAGNOSIS — R7989 Other specified abnormal findings of blood chemistry: Secondary | ICD-10-CM

## 2017-10-25 DIAGNOSIS — I10 Essential (primary) hypertension: Secondary | ICD-10-CM

## 2017-10-25 NOTE — Progress Notes (Signed)
Orders placed for f/u labs.  

## 2017-10-25 NOTE — Telephone Encounter (Signed)
Orders placed for f/u labs.  

## 2017-10-26 ENCOUNTER — Other Ambulatory Visit (INDEPENDENT_AMBULATORY_CARE_PROVIDER_SITE_OTHER): Payer: Medicare Other

## 2017-10-26 DIAGNOSIS — R945 Abnormal results of liver function studies: Secondary | ICD-10-CM

## 2017-10-26 DIAGNOSIS — R7989 Other specified abnormal findings of blood chemistry: Secondary | ICD-10-CM

## 2017-10-26 DIAGNOSIS — I10 Essential (primary) hypertension: Secondary | ICD-10-CM | POA: Diagnosis not present

## 2017-10-26 LAB — HEPATIC FUNCTION PANEL
ALK PHOS: 34 U/L — AB (ref 39–117)
ALT: 33 U/L (ref 0–35)
AST: 44 U/L — ABNORMAL HIGH (ref 0–37)
Albumin: 4.3 g/dL (ref 3.5–5.2)
BILIRUBIN TOTAL: 0.7 mg/dL (ref 0.2–1.2)
Bilirubin, Direct: 0.2 mg/dL (ref 0.0–0.3)
Total Protein: 7.2 g/dL (ref 6.0–8.3)

## 2017-10-26 LAB — BASIC METABOLIC PANEL
BUN: 13 mg/dL (ref 6–23)
CHLORIDE: 95 meq/L — AB (ref 96–112)
CO2: 29 meq/L (ref 19–32)
CREATININE: 1.32 mg/dL — AB (ref 0.40–1.20)
Calcium: 9.3 mg/dL (ref 8.4–10.5)
GFR: 41.76 mL/min — ABNORMAL LOW (ref >60.00)
GLUCOSE: 115 mg/dL — AB (ref 70–99)
Potassium: 4.3 mEq/L (ref 3.5–5.1)
SODIUM: 132 meq/L — AB (ref 135–145)

## 2017-10-26 NOTE — Addendum Note (Signed)
Addended by: Arby Barrette on: 10/26/2017 03:10 PM   Modules accepted: Orders

## 2017-10-26 NOTE — Addendum Note (Signed)
Addended by: Arby Barrette on: 10/26/2017 11:36 AM   Modules accepted: Orders

## 2017-10-31 ENCOUNTER — Other Ambulatory Visit: Payer: Self-pay

## 2017-10-31 ENCOUNTER — Encounter: Payer: Self-pay | Admitting: Internal Medicine

## 2017-10-31 ENCOUNTER — Telehealth: Payer: Self-pay | Admitting: Internal Medicine

## 2017-10-31 MED ORDER — CETIRIZINE HCL 10 MG PO TABS: 10.0000 mg | ORAL_TABLET | Freq: Every day | ORAL | 1 refills | Status: DC

## 2017-10-31 NOTE — Telephone Encounter (Signed)
Documented in result notes. 

## 2017-10-31 NOTE — Addendum Note (Signed)
Addended by: Matilde Sprang on: 10/31/2017 04:25 PM   Modules accepted: Level of Service, SmartSet

## 2017-10-31 NOTE — Telephone Encounter (Signed)
Copied from [place] [id number]. Topic: Quick Communication - Lab Results >> Oct 26, 2017  5:01 PM Lars Masson, LPN wrote: Called patient to inform them of lab results. When patient returns call, triage nurse may disclose results.

## 2017-10-31 NOTE — Telephone Encounter (Signed)
This encounter was created in error - please disregard.

## 2017-10-31 NOTE — Progress Notes (Signed)
This encounter was created in error - please disregard.

## 2017-11-03 ENCOUNTER — Telehealth: Payer: Self-pay | Admitting: Radiology

## 2017-11-03 ENCOUNTER — Other Ambulatory Visit: Payer: Self-pay | Admitting: Internal Medicine

## 2017-11-03 DIAGNOSIS — E871 Hypo-osmolality and hyponatremia: Secondary | ICD-10-CM

## 2017-11-03 NOTE — Progress Notes (Signed)
Orders placed for f/u met b 

## 2017-11-03 NOTE — Telephone Encounter (Signed)
Pt coming in for labs Monday, please place future orders. Thank you 

## 2017-11-03 NOTE — Telephone Encounter (Signed)
Order placed for f/u met b 

## 2017-11-06 ENCOUNTER — Other Ambulatory Visit (INDEPENDENT_AMBULATORY_CARE_PROVIDER_SITE_OTHER): Payer: Medicare Other

## 2017-11-06 DIAGNOSIS — E871 Hypo-osmolality and hyponatremia: Secondary | ICD-10-CM

## 2017-11-06 LAB — BASIC METABOLIC PANEL
BUN: 9 mg/dL (ref 6–23)
CO2: 30 meq/L (ref 19–32)
Calcium: 9 mg/dL (ref 8.4–10.5)
Chloride: 98 mEq/L (ref 96–112)
Creatinine, Ser: 1.08 mg/dL (ref 0.40–1.20)
GFR: 52.63 mL/min — ABNORMAL LOW (ref >60.00)
Glucose, Bld: 133 mg/dL — ABNORMAL HIGH (ref 70–99)
POTASSIUM: 4.1 meq/L (ref 3.5–5.1)
Sodium: 134 mEq/L — ABNORMAL LOW (ref 135–145)

## 2017-11-13 ENCOUNTER — Ambulatory Visit: Payer: Medicare Other | Admitting: Pulmonary Disease

## 2017-11-14 DIAGNOSIS — M0579 Rheumatoid arthritis with rheumatoid factor of multiple sites without organ or systems involvement: Secondary | ICD-10-CM | POA: Diagnosis not present

## 2017-11-15 ENCOUNTER — Other Ambulatory Visit: Payer: Self-pay | Admitting: Internal Medicine

## 2017-11-21 DIAGNOSIS — I10 Essential (primary) hypertension: Secondary | ICD-10-CM | POA: Diagnosis not present

## 2017-11-21 DIAGNOSIS — I480 Paroxysmal atrial fibrillation: Secondary | ICD-10-CM | POA: Diagnosis not present

## 2017-11-21 DIAGNOSIS — E782 Mixed hyperlipidemia: Secondary | ICD-10-CM | POA: Diagnosis not present

## 2017-11-21 DIAGNOSIS — I059 Rheumatic mitral valve disease, unspecified: Secondary | ICD-10-CM | POA: Diagnosis not present

## 2017-11-27 ENCOUNTER — Ambulatory Visit: Payer: Medicare Other | Admitting: Pulmonary Disease

## 2017-12-06 ENCOUNTER — Telehealth: Payer: Self-pay | Admitting: Internal Medicine

## 2017-12-06 MED ORDER — ALPRAZOLAM 0.25 MG PO TABS: 0.2500 mg | ORAL_TABLET | Freq: Every day | ORAL | 0 refills | Status: DC | PRN

## 2017-12-06 NOTE — Telephone Encounter (Signed)
[facility] to [facility]

## 2017-12-06 NOTE — Telephone Encounter (Signed)
Last OV 10/10/2017 Next OV 01/19/2018 Last refill 09/22/2017

## 2017-12-06 NOTE — Telephone Encounter (Signed)
pt needs a refill on ALPRAZolam (XANAX) 0.25 MG tablet Please let pt know when completed

## 2017-12-06 NOTE — Telephone Encounter (Signed)
Order ok's for alprazolam #90 with no refills.

## 2017-12-07 ENCOUNTER — Ambulatory Visit: Payer: Medicare Other | Admitting: Pulmonary Disease

## 2017-12-12 IMAGING — DX DG FOOT COMPLETE 3+V*L*
3 series · 3 of 3 positions shown · non-contrast
Comparison: Report of prior study May 03, 2015 available;
images from that study cannot be retrieved.

CLINICAL DATA: Pain following recent injury

EXAM:
LEFT FOOT - COMPLETE 3+ VIEW

[foot ap]
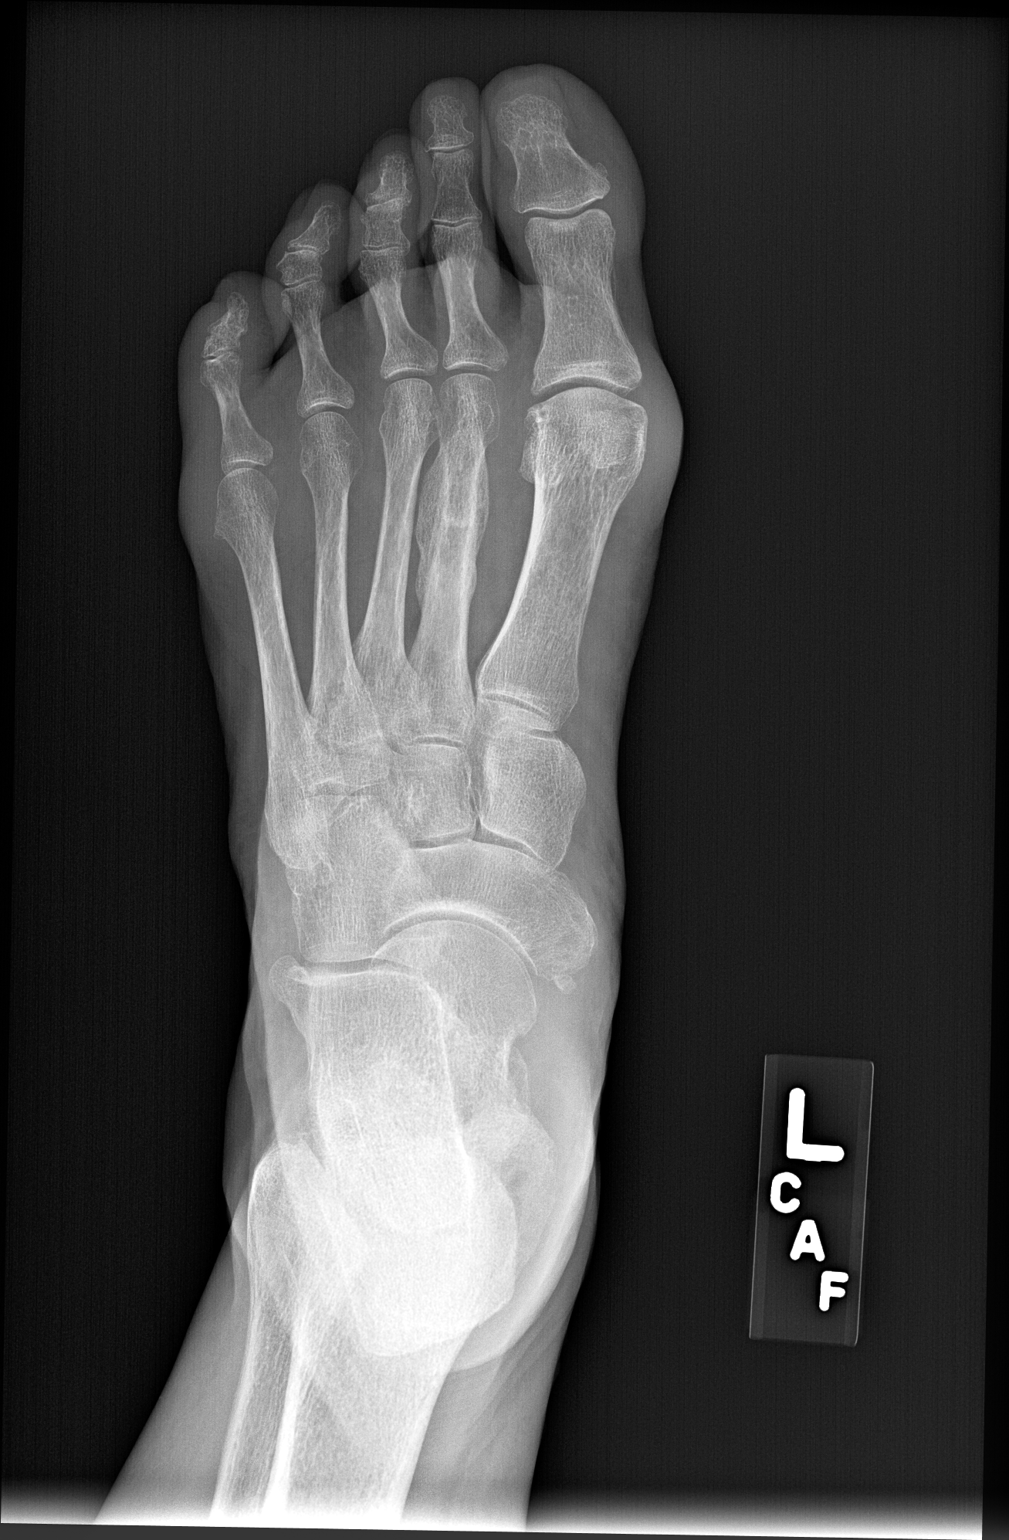

[foot obl (oblique)]
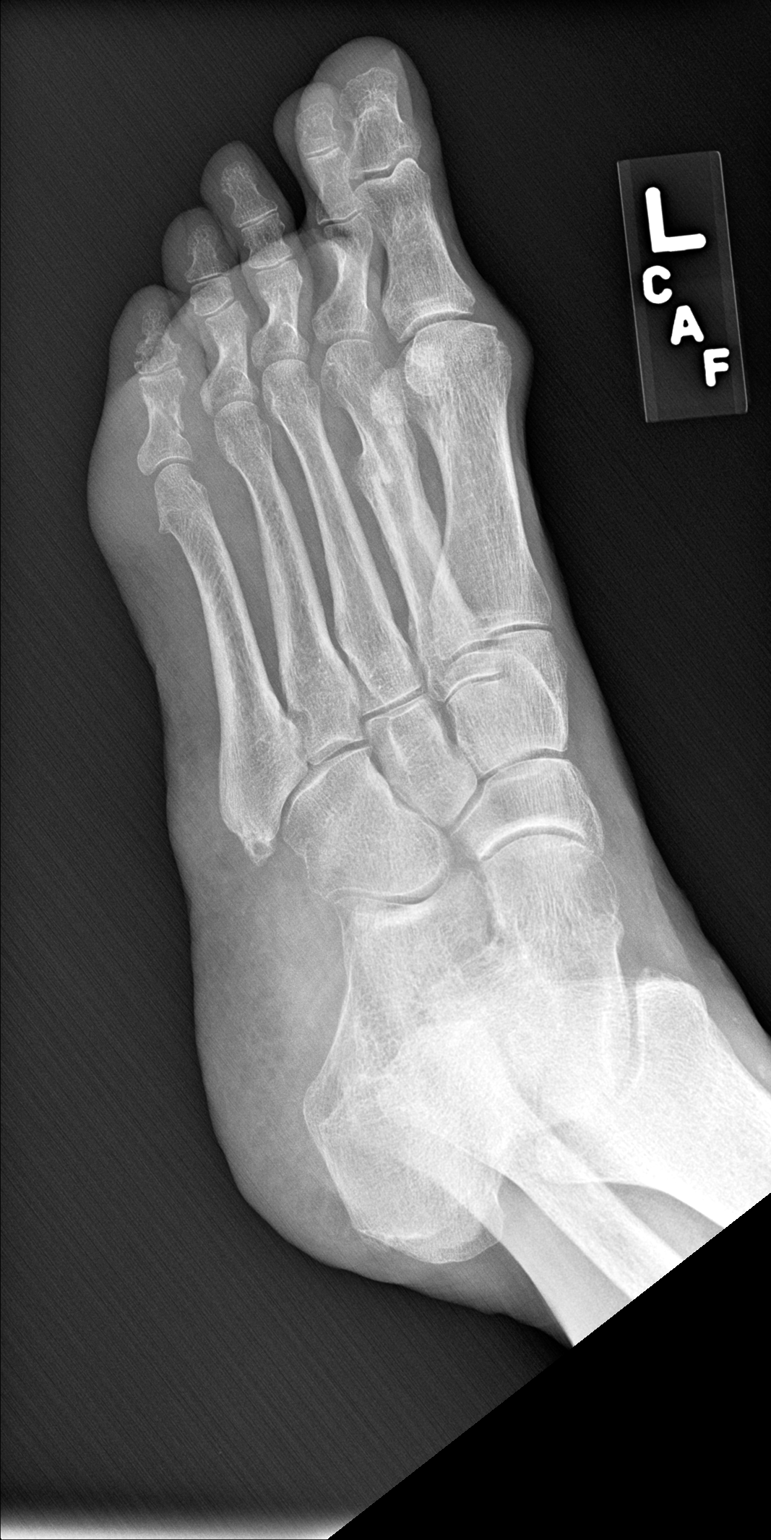

[foot lat]
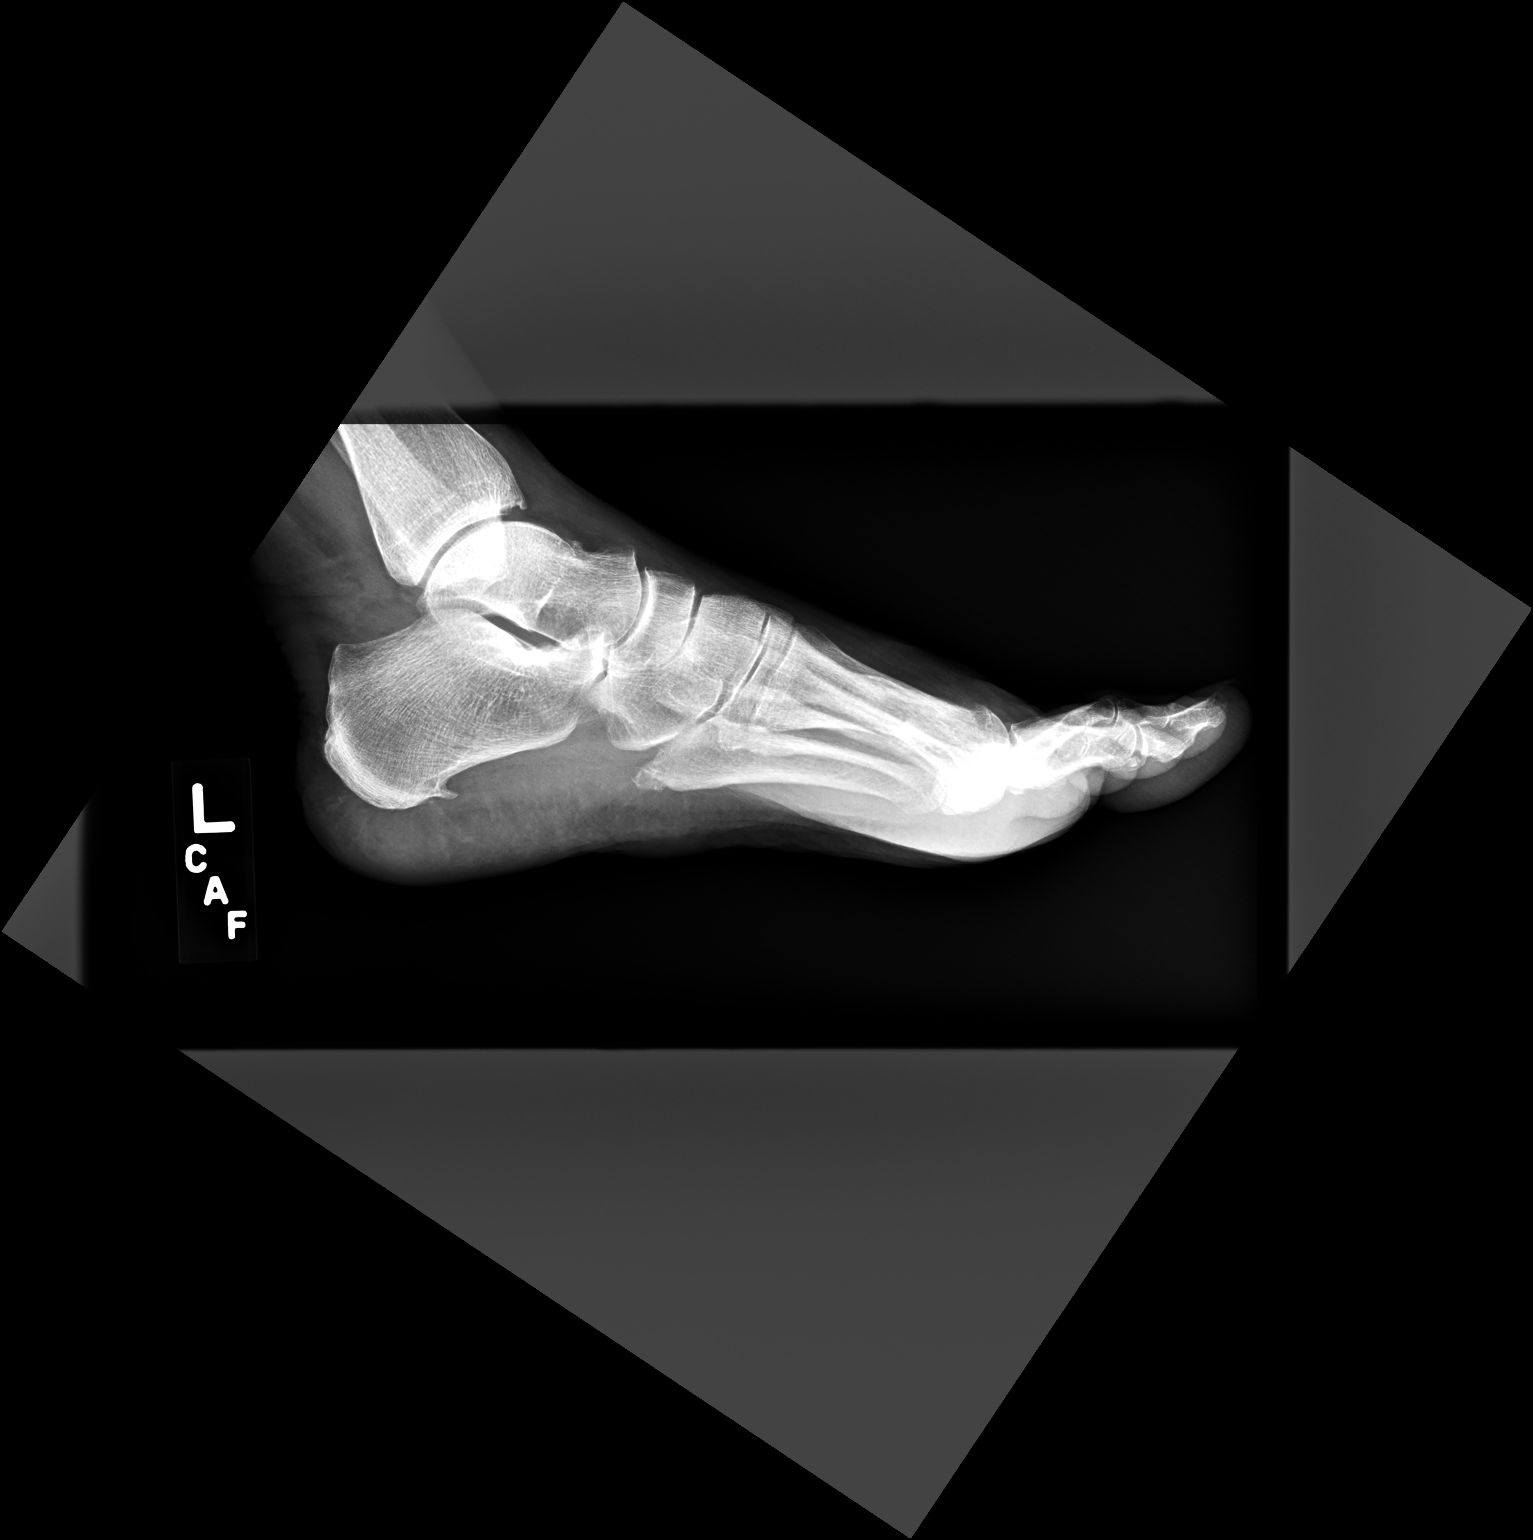

[3 of 3 positions shown; findings below may reference images not displayed]

FINDINGS: Frontal, oblique, and lateral views were obtained. There is evidence
of a recent fracture, obliquely oriented, of the midportion of the
fifth proximal phalanx. Alignment is essentially anatomic. There is
no other evidence of recent fracture. There is evidence of an older
fracture with remodeling involving the second metatarsal. No
dislocation. The joint spaces appear normal. There is mild bony
overgrowth along the proximal lateral fifth metatarsal, likely due
to calcific tendinosis. There is a spur arising from the inferior
calcaneus.
IMPRESSION: Recent fracture of the fifth proximal phalanx with alignment
essentially anatomic. Old fractures second metatarsal with
remodeling. No other fracture. No dislocation. Inferior calcaneal
spur.

These results will be called to the ordering clinician or
representative by the Radiologist Assistant, and communication
documented in the PACS or zVision Dashboard.

## 2017-12-18 ENCOUNTER — Ambulatory Visit: Payer: Medicare Other | Admitting: Internal Medicine

## 2017-12-21 ENCOUNTER — Ambulatory Visit (INDEPENDENT_AMBULATORY_CARE_PROVIDER_SITE_OTHER): Payer: Medicare Other | Admitting: Internal Medicine

## 2017-12-21 ENCOUNTER — Ambulatory Visit (INDEPENDENT_AMBULATORY_CARE_PROVIDER_SITE_OTHER): Payer: Medicare Other

## 2017-12-21 ENCOUNTER — Encounter: Payer: Self-pay | Admitting: Internal Medicine

## 2017-12-21 VITALS — BP 150/48 | HR 68 | Temp 98.5°F | Ht 64.0 in | Wt 157.0 lb

## 2017-12-21 DIAGNOSIS — L508 Other urticaria: Secondary | ICD-10-CM | POA: Diagnosis not present

## 2017-12-21 DIAGNOSIS — M5416 Radiculopathy, lumbar region: Secondary | ICD-10-CM

## 2017-12-21 DIAGNOSIS — W57XXXA Bitten or stung by nonvenomous insect and other nonvenomous arthropods, initial encounter: Secondary | ICD-10-CM | POA: Diagnosis not present

## 2017-12-21 DIAGNOSIS — S80862A Insect bite (nonvenomous), left lower leg, initial encounter: Secondary | ICD-10-CM | POA: Diagnosis not present

## 2017-12-21 DIAGNOSIS — M5116 Intervertebral disc disorders with radiculopathy, lumbar region: Secondary | ICD-10-CM | POA: Diagnosis not present

## 2017-12-21 LAB — COMPREHENSIVE METABOLIC PANEL
ALBUMIN: 4.1 g/dL (ref 3.5–5.2)
ALT: 37 U/L — AB (ref 0–35)
AST: 51 U/L — AB (ref 0–37)
Alkaline Phosphatase: 38 U/L — ABNORMAL LOW (ref 39–117)
BILIRUBIN TOTAL: 0.6 mg/dL (ref 0.2–1.2)
BUN: 8 mg/dL (ref 6–23)
CALCIUM: 9.8 mg/dL (ref 8.4–10.5)
CHLORIDE: 98 meq/L (ref 96–112)
CO2: 29 meq/L (ref 19–32)
CREATININE: 1.21 mg/dL — AB (ref 0.40–1.20)
GFR: 46.15 mL/min — AB (ref >60.00)
Glucose, Bld: 97 mg/dL (ref 70–99)
Potassium: 3.5 mEq/L (ref 3.5–5.1)
Sodium: 136 mEq/L (ref 135–145)
Total Protein: 7.2 g/dL (ref 6.0–8.3)

## 2017-12-21 LAB — CBC WITH DIFFERENTIAL/PLATELET
Basophils Absolute: 0.1 10*3/uL (ref 0.0–0.1)
Basophils Relative: 1.2 % (ref 0.0–3.0)
Eosinophils Absolute: 0.2 10*3/uL (ref 0.0–0.7)
Eosinophils Relative: 2.5 % (ref 0.0–5.0)
HEMATOCRIT: 36.6 % (ref 36.0–46.0)
HEMOGLOBIN: 12.5 g/dL (ref 12.0–15.0)
LYMPHS PCT: 25.8 % (ref 12.0–46.0)
Lymphs Abs: 1.7 10*3/uL (ref 0.7–4.0)
MCHC: 34.1 g/dL (ref 30.0–36.0)
MCV: 94 fl (ref 78.0–100.0)
MONOS PCT: 10.1 % (ref 3.0–12.0)
Monocytes Absolute: 0.7 10*3/uL (ref 0.1–1.0)
Neutro Abs: 4 10*3/uL (ref 1.4–7.7)
Neutrophils Relative %: 60.4 % (ref 43.0–77.0)
Platelets: 225 10*3/uL (ref 150.0–400.0)
RBC: 3.9 Mil/uL (ref 3.87–5.11)
RDW: 12.5 % (ref 11.5–15.5)
WBC: 6.7 10*3/uL (ref 4.0–10.5)

## 2017-12-21 MED ORDER — TRIAMCINOLONE ACETONIDE 0.1 % EX CREA: 1.0000 "application " | TOPICAL_CREAM | Freq: Two times a day (BID) | CUTANEOUS | 0 refills | Status: DC

## 2017-12-21 MED ORDER — HYDROXYZINE HCL 25 MG PO TABS: 25.0000 mg | ORAL_TABLET | Freq: Three times a day (TID) | ORAL | 0 refills | Status: DC | PRN

## 2017-12-21 MED ORDER — METHYLPREDNISOLONE ACETATE 40 MG/ML IJ SUSP
40.0000 mg | Freq: Once | INTRAMUSCULAR | Status: AC
  Administered 2017-12-21: 40 mg via INTRAMUSCULAR

## 2017-12-21 NOTE — Patient Instructions (Addendum)
F/u in 2-4 weeks sooner if needed  Try Sarna lotion over the counter  If you take Hydroxyzine as needed dont take Xanax at night   Insect Bite, Adult An insect bite can make your skin red, itchy, and swollen. An insect bite is different from an insect sting, which happens when an insect injects poison (venom) into the skin. Some insects can spread disease to people through a bite. However, most insect bites do not lead to disease and are not serious. What are the causes? Insects may bite for a variety of reasons, including:  Hunger.  To defend themselves.  Insects that bite include:  Spiders.  Mosquitoes.  Ticks.  Fleas.  Ants.  Flies.  Bedbugs.  What are the signs or symptoms? Symptoms of this condition include:  Itching or pain in the bite area.  Redness and swelling in the bite area.  An open wound (skin ulcer).  In many cases, symptoms last for 2-4 days. How is this diagnosed? This condition is usually diagnosed based on symptoms and a physical exam. How is this treated? Treatment is usually not needed. Symptoms often go away on their own. When treatment is recommended, it may involve:  Applying a cream or lotion to the bitten area. This treatment helps with itching.  Taking an antibiotic medicine. This treatment is needed if the bite area gets infected.  Getting a tetanus shot.  Applying ice to the affected area.  Medicines called antihistamines. This treatment is needed if you develop an allergic reaction to the insect bite.  Follow these instructions at home: Bite area care  Do not scratch the bite area.  Keep the bite area clean and dry. Wash it every day with soap and water as told by your health care provider.  Check the bite area every day for signs of infection. Check for: ? More redness, swelling, or pain. ? Fluid or blood. ? Warmth. ? Pus. Managing pain, itching, and swelling   You may apply a baking soda paste, cortisone cream, or  calamine lotion to the bite area as told by your health care provider.  If directed, applyice to the bite area. ? Put ice in a plastic bag. ? Place a towel between your skin and the bag. ? Leave the ice on for 20 minutes, 2-3 times per day. Medicines  Apply or take over-the-counter and prescription medicines only as told by your health care provider.  If you were prescribed an antibiotic medicine, use it as told by your health care provider. Do not stop using the antibiotic even if your condition improves. General instructions  Keep all follow-up visits as told by your health care provider. This is important. How is this prevented? To help reduce your risk of insect bites:  When you are outdoors, wear clothing that covers your arms and legs.  Use insect repellent. The best insect repellents contain: ? DEET, picaridin, oil of lemon eucalyptus (OLE), or IR3535. ? Higher amounts of an active ingredient.  If your home windows do not have screens, consider installing them.  Contact a health care provider if:  You have more redness, swelling, or pain in the bite area.  You have fluid, blood, or pus coming from the bite area.  The bite area feels warm to the touch.  You have a fever. Get help right away if:  You have joint pain.  You have a rash.  You have shortness of breath.  You feel unusually tired or sleepy.  You have  neck pain.  You have a headache.  You have unusual weakness.  You have chest pain.  You have nausea, vomiting, or pain in the abdomen. This information is not intended to replace advice given to you by your health care provider. Make sure you discuss any questions you have with your health care provider. Document Released: 08/04/2004 Document Revised: 02/24/2016 Document Reviewed: 01/04/2016 Elsevier Interactive Patient Education  2018 [place] (urticaria) are itchy, red, swollen areas on your skin. Hives can appear on any part  of your body and can vary in size. They can be as small as the tip of a pen or much larger. Hives often fade within 24 hours (acute hives). In other cases, new hives appear after old ones fade. This cycle can continue for several days or weeks (chronic hives). Hives result from your body's reaction to an irritant or to something that you are allergic to (trigger). When you are exposed to a trigger, your body releases a chemical (histamine) that causes redness, itching, and swelling. You can get hives immediately after being exposed to a trigger or hours later. Hives do not spread from person to person (are not contagious). Your hives may get worse with scratching, exercise, and emotional stress. What are the causes? Causes of this condition include:  Allergies to certain foods or ingredients.  Insect bites or stings.  Exposure to pollen or pet dander.  Contact with latex or chemicals.  Spending time in sunlight, heat, or cold (exposure).  Exercise.  Stress.  You can also get hives from some medical conditions and treatments. These include:  Viruses, including the common cold.  Bacterial infections, such as urinary tract infections and strep throat.  Disorders such as vasculitis, lupus, or thyroid disease.  Certain medications.  Allergy shots.  Blood transfusions.  Sometimes, the cause of hives is not known (idiopathic hives). What increases the risk? This condition is more likely to develop in:  Women.  People who have food allergies, especially to citrus fruits, milk, eggs, peanuts, tree nuts, or shellfish.  People who are allergic to: ? Medicines. ? Latex. ? Insects. ? Animals. ? Pollen.  People who have certain medical conditions, includinglupus or thyroid disease.  What are the signs or symptoms? The main symptom of this condition is raised, itchyred or white bumps or patches on your skin. These areas may:  Become large and swollen (welts).  Change in  shape and location, quickly and repeatedly.  Be separate hives or connect over a large area of skin.  Sting or become painful.  Turn white when pressed in the center (blanch).  In severe cases, yourhands, feet, and face may also become swollen. This may occur if hives develop deeper in your skin. How is this diagnosed? This condition is diagnosed based on your symptoms, medical history, and physical exam. Your skin, urine, or blood may be tested to find out what is causing your hives and to rule out other health issues. Your health care provider may also remove a small sample of skin from the affected area and examine it under a microscope (biopsy). How is this treated? Treatment depends on the severity of your condition. Your health care provider may recommend using cool, wet cloths (cool compresses) or taking cool showers to relieve itching. Hives are sometimes treated with medicines, including:  Antihistamines.  Corticosteroids.  Antibiotics.  An injectable medicine (omalizumab). Your health care provider may prescribe this if you have chronic idiopathic hives and you continue  to have symptoms even after treatment with antihistamines.  Severe cases may require an emergency injection of adrenaline (epinephrine) to prevent a life-threatening allergic reaction (anaphylaxis). Follow these instructions at home: Medicines  Take or apply over-the-counter and prescription medicines only as told by your health care provider.  If you were prescribed an antibiotic medicine, use it as told by your health care provider. Do not stop taking the antibiotic even if you start to feel better. Skin Care  Apply cool compresses to the affected areas.  Do not scratch or rub your skin. General instructions  Do not take hot showers or baths. This can make itching worse.  Do not wear tight-fitting clothing.  Use sunscreen and wear protective clothing when you are outside.  Avoid any substances  that cause your hives. Keep a journal to help you track what causes your hives. Write down: ? What medicines you take. ? What you eat and drink. ? What products you use on your skin.  Keep all follow-up visits as told by your health care provider. This is important. Contact a health care provider if:  Your symptoms are not controlled with medicine.  Your joints are painful or swollen. Get help right away if:  You have a fever.  You have pain in your abdomen.  Your tongue or lips are swollen.  Your eyelids are swollen.  Your chest or throat feels tight.  You have trouble breathing or swallowing. These symptoms may represent a serious problem that is an emergency. Do not wait to see if the symptoms will go away. Get medical help right away. Call your local emergency services (911 in the [place] Do not drive yourself to the hospital. This information is not intended to replace advice given to you by your health care provider. Make sure you discuss any questions you have with your health care provider. Document Released: 06/27/2005 Document Revised: 11/25/2015 Document Reviewed: 04/15/2015 Elsevier Interactive Patient Education  2018 [employer].

## 2017-12-21 NOTE — Progress Notes (Signed)
Chief Complaint  Patient presents with  . Rash   C/o spider bite 2.5 to 3 weeks ago on left foot with left foot swelling and since then has had itching all over body and hives. No new medications or topical products. She has broken out on bumps on arms/legs and trunk tried Vasoline and rubbing alcohol to help She is scratching so much left arm is bruised. Of note also Dr. Ubaldo Glassing put her on Amiodarone 200 06/2017 but sx's as noted started after spider bite. She is having trouble sleeping due to itching  She c/o burning/tingling/numbness of legs at night reviewed prior Xray and MRI low back +degenerative changes since 2009 and 2010. She has been taken off Gabapentin which was not helping.  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       Down 11 lb s  Cardiovascular: Negative for chest pain.  Gastrointestinal:       +reduced appetite   Skin: Positive for itching and rash.  Neurological: Positive for sensory change.  Psychiatric/Behavioral: The patient has insomnia.    Past Medical History:  Diagnosis Date  . Anemia   . Cancer ([place])   . Diverticulitis   . GERD (gastroesophageal reflux disease)   . Hyperlipidemia   . Hypertension   . Osteoarthritis   . Osteoporosis    actonel  . Positive PPD    s/p INH (2006)  . Rheumatoid arthritis(714.0)    MTX transaminitis, Leflunomide (rash), enbrel, plaquinil, prednisone, remicade, Imuran (transaminitis)  . 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 ABDOMINAL HYSTERECTOMY    . CERVICAL CONE BIOPSY    . CHOLECYSTECTOMY  06/22/14  . COLONOSCOPY WITH PROPOFOL N/A 07/09/2015   Procedure: COLONOSCOPY WITH PROPOFOL;  Surgeon: Manya Silvas, MD;  Location: [facility] ENDOSCOPY;  Service: Endoscopy;  Laterality: N/A;  . ESOPHAGOGASTRODUODENOSCOPY (EGD) WITH PROPOFOL N/A 07/09/2015   Procedure: ESOPHAGOGASTRODUODENOSCOPY (EGD) WITH PROPOFOL;   Surgeon: Manya Silvas, MD;  Location: [facility] ENDOSCOPY;  Service: Endoscopy;  Laterality: N/A;  . OVARY SURGERY    . TRACHEOSTOMY  1959  . TUBAL LIGATION     Family History  Problem Relation Age of Onset  . Heart disease Father        MI  . Heart disease Mother   . Valvular heart disease Mother   . Breast cancer Sister 40  . Cancer Sister        Lung cancer  . COPD Sister   . Heart disease Sister   . Colon cancer Neg Hx    Social History   Socioeconomic History  . Marital status: Widowed    Spouse name: Not on file  . Number of children: 4  . Years of education: Not on file  . Highest education level: Not on file  Occupational History  . Occupation: retired  Scientific laboratory technician  . Financial resource strain: Not on file  . Food insecurity:    Worry: Not on file    Inability: Not on file  . Transportation needs:    Medical: Not on file    Non-medical: Not on file  Tobacco Use  . Smoking status: Never Smoker  . Smokeless tobacco: Never Used  Substance and Sexual Activity  . Alcohol use: No    Alcohol/week: 0.0 oz  . Drug use: No  . Sexual activity: Never  Lifestyle  . Physical activity:  Days per week: Not on file    Minutes per session: Not on file  . Stress: Not on file  Relationships  . Social connections:    Talks on phone: Not on file    Gets together: Not on file    Attends religious service: Not on file    Active member of club or organization: Not on file    Attends meetings of clubs or organizations: Not on file    Relationship status: Not on file  . Intimate partner violence:    Fear of current or ex partner: Not on file    Emotionally abused: Not on file    Physically abused: Not on file    Forced sexual activity: Patient refused  Other Topics Concern  . Not on file  Social History Narrative   Lives with family at home   Current Meds  Medication Sig  . acetaminophen (TYLENOL) 325 MG tablet Take 2 tablets (650 mg total) by mouth every 6 (six)  hours as needed for mild pain (or Fever >/= 101).  Marland Kitchen acidophilus (RISAQUAD) CAPS capsule Take 1 capsule by mouth daily.  Marland Kitchen ADVAIR DISKUS 250-50 MCG/DOSE AEPB USE 1 INHALATION EVERY 12 HOURS  . albuterol (PROAIR HFA) 108 (90 Base) MCG/ACT inhaler USE 2 INHALATIONS EVERY 6 HOURS AS NEEDED FOR WHEEZING OR SHORTNESS OF BREATH  . alendronate (FOSAMAX) 70 MG tablet Take 70 mg by mouth once a week.   . ALPRAZolam (XANAX) 0.25 MG tablet Take 1 tablet (0.25 mg total) by mouth daily as needed for anxiety.  Marland Kitchen amiodarone (PACERONE) 200 MG tablet Take 1 tablet by mouth daily.  Marland Kitchen aspirin EC 81 MG tablet Take 81 mg by mouth daily.  . Calcium Carbonate-Vitamin D (CALCIUM 600+D) 600-400 MG-UNIT tablet Take 1 tablet by mouth 2 (two) times daily.   . cetirizine (ZYRTEC) 10 MG tablet Take 1 tablet (10 mg total) by mouth daily.  . cholecalciferol (VITAMIN D) 1000 units tablet Take 1,000 Units by mouth daily.  . colestipol (COLESTID) 1 g tablet Take 2 g by mouth daily.   Marland Kitchen esomeprazole (NEXIUM) 20 MG capsule Take 20 mg by mouth daily at 12 noon.  . Fe Fum-FePoly-Vit C-Vit B3 (INTEGRA) 62.5-62.5-40-3 MG CAPS One tablet per day  . FeFum-FePo-FA-B Cmp-C-Zn-Mn-Cu (TANDEM PLUS) 162-115.2-1 MG CAPS TAKE 1 CAPSULE DAILY  . fluticasone (FLONASE) 50 MCG/ACT nasal spray Place 2 sprays into both nostrils daily. (Patient taking differently: Place 2 sprays into both nostrils 2 (two) times daily. )  . guaiFENesin-dextromethorphan (ROBITUSSIN DM) 100-10 MG/5ML syrup Take 5 mLs by mouth every 4 (four) hours as needed for cough.  . hydroxychloroquine (PLAQUENIL) 200 MG tablet Take 200 mg by mouth 2 (two) times daily.   Marland Kitchen lip balm (BLISTEX) OINT Apply 1 application topically as needed for lip care.  . meclizine (ANTIVERT) 25 MG tablet take 1/2 to 1 tablet every 8 hours if needed for VERTIGO  . Multiple Vitamin (MULTIVITAMIN WITH MINERALS) TABS tablet Take 1 tablet by mouth daily.  . ondansetron (ZOFRAN ODT) 4 MG disintegrating tablet  Take 1 tablet (4 mg total) by mouth every 8 (eight) hours as needed for nausea or vomiting.  . potassium chloride (KLOR-CON SPRINKLE) 10 MEQ CR capsule TAKE 2 CAPSULES Once a day  . predniSONE (DELTASONE) 5 MG tablet Take 1 tablet (5 mg total) by mouth daily with breakfast.  . simvastatin (ZOCOR) 20 MG tablet TAKE 1 TABLET DAILY  . sodium chloride (OCEAN) 0.65 % nasal spray Place 1  spray into the nose as needed for congestion.  Marland Kitchen SPIRIVA HANDIHALER 18 MCG inhalation capsule INHALE THE CONTENTS OF 1 CAPSULE DAILY  . traMADol (ULTRAM) 50 MG tablet Take 50 mg by mouth 2 (two) times daily as needed for moderate pain.   Allergies  Allergen Reactions  . Astelin [Azelastine Hcl] Other (See Comments)    Reaction:  Unknown   . Codeine Other (See Comments)    Reaction:  Altered mental status  . Dilaudid [Hydromorphone] Other (See Comments)    Reaction:  Unknown   . Flexeril [Cyclobenzaprine] Other (See Comments)    Reaction:  Unknown   . Imuran [Azathioprine] Other (See Comments)    Reaction:  Abnormal liver function  . Lisinopril Itching  . Lyrica [Pregabalin] Other (See Comments)    Reaction:  Sore gums   . Methotrexate Derivatives Other (See Comments)    Reaction:  Abnormal liver function  . Amoxicillin Rash and Other (See Comments)    Unable to obtain enough information to answer additional questions about this medication.    Jolee Ewing [Leflunomide] Rash  . Clindamycin/Lincomycin Rash  . Doxycycline Rash  . Lodine [Etodolac] Rash  . Percocet [Oxycodone-Acetaminophen] Rash  . Sulfa Antibiotics Rash   Recent Results (from the past 2160 hour(s))  CBC with Differential/Platelet     Status: None   Collection Time: 10/10/17  4:12 PM  Result Value Ref Range   WBC 7.3 4.0 - 10.5 K/uL   RBC 3.99 3.87 - 5.11 Mil/uL   Hemoglobin 12.6 12.0 - 15.0 g/dL   HCT 37.3 36.0 - 46.0 %   MCV 93.7 78.0 - 100.0 fl   MCHC 33.7 30.0 - 36.0 g/dL   RDW 14.2 11.5 - 15.5 %   Platelets 199.0 150.0 - 400.0 K/uL    Neutrophils Relative % 66.1 43.0 - 77.0 %   Lymphocytes Relative 25.1 12.0 - 46.0 %   Monocytes Relative 6.0 3.0 - 12.0 %   Eosinophils Relative 2.0 0.0 - 5.0 %   Basophils Relative 0.8 0.0 - 3.0 %   Neutro Abs 4.8 1.4 - 7.7 K/uL   Lymphs Abs 1.8 0.7 - 4.0 K/uL   Monocytes Absolute 0.4 0.1 - 1.0 K/uL   Eosinophils Absolute 0.1 0.0 - 0.7 K/uL   Basophils Absolute 0.1 0.0 - 0.1 K/uL  Hepatic function panel     Status: Abnormal   Collection Time: 10/10/17  4:12 PM  Result Value Ref Range   Total Bilirubin 0.7 0.2 - 1.2 mg/dL   Bilirubin, Direct 0.2 0.0 - 0.3 mg/dL   Alkaline Phosphatase 36 (L) 39 - 117 U/L   AST 49 (H) 0 - 37 U/L   ALT 38 (H) 0 - 35 U/L   Total Protein 7.4 6.0 - 8.3 g/dL   Albumin 4.1 3.5 - 5.2 g/dL  TSH     Status: None   Collection Time: 10/10/17  4:12 PM  Result Value Ref Range   TSH 2.56 0.35 - 4.50 uIU/mL  Basic metabolic panel     Status: Abnormal   Collection Time: 10/10/17  4:12 PM  Result Value Ref Range   Sodium 135 135 - 145 mEq/L   Potassium 4.4 3.5 - 5.1 mEq/L   Chloride 97 96 - 112 mEq/L   CO2 30 19 - 32 mEq/L   Glucose, Bld 118 (H) 70 - 99 mg/dL   BUN 14 6 - 23 mg/dL   Creatinine, Ser 1.15 0.40 - 1.20 mg/dL   Calcium 9.2 8.4 -  10.5 mg/dL   GFR 48.96 (L) >60.00 mL/min  Ferritin     Status: None   Collection Time: 10/10/17  4:12 PM  Result Value Ref Range   Ferritin 65.1 10.0 - 291.0 ng/mL  Hepatic function panel     Status: Abnormal   Collection Time: 10/26/17  3:10 PM  Result Value Ref Range   Total Bilirubin 0.7 0.2 - 1.2 mg/dL   Bilirubin, Direct 0.2 0.0 - 0.3 mg/dL   Alkaline Phosphatase 34 (L) 39 - 117 U/L   AST 44 (H) 0 - 37 U/L   ALT 33 0 - 35 U/L   Total Protein 7.2 6.0 - 8.3 g/dL   Albumin 4.3 3.5 - 5.2 g/dL  Basic metabolic panel     Status: Abnormal   Collection Time: 10/26/17  3:10 PM  Result Value Ref Range   Sodium 132 (L) 135 - 145 mEq/L   Potassium 4.3 3.5 - 5.1 mEq/L   Chloride 95 (L) 96 - 112 mEq/L   CO2 29 19 -  32 mEq/L   Glucose, Bld 115 (H) 70 - 99 mg/dL   BUN 13 6 - 23 mg/dL   Creatinine, Ser 1.32 (H) 0.40 - 1.20 mg/dL   Calcium 9.3 8.4 - 10.5 mg/dL   GFR 41.76 (L) >60.00 mL/min  Basic metabolic panel     Status: Abnormal   Collection Time: 11/06/17  2:07 PM  Result Value Ref Range   Sodium 134 (L) 135 - 145 mEq/L   Potassium 4.1 3.5 - 5.1 mEq/L   Chloride 98 96 - 112 mEq/L   CO2 30 19 - 32 mEq/L   Glucose, Bld 133 (H) 70 - 99 mg/dL   BUN 9 6 - 23 mg/dL   Creatinine, Ser 1.08 0.40 - 1.20 mg/dL   Calcium 9.0 8.4 - 10.5 mg/dL   GFR 52.63 (L) >60.00 mL/min   Objective  Body mass index is 26.95 kg/m. Wt Readings from Last 3 Encounters:  12/21/17 157 lb (71.2 kg)  10/10/17 168 lb (76.2 kg)  09/05/17 167 lb (75.8 kg)   Temp Readings from Last 3 Encounters:  12/21/17 98.5 F (36.9 C) (Oral)  10/10/17 97.8 F (36.6 C) (Oral)  09/05/17 98.3 F (36.8 C) (Oral)   BP Readings from Last 3 Encounters:  12/21/17 (!) 150/48  10/10/17 (!) 144/78  09/05/17 138/80   Pulse Readings from Last 3 Encounters:  12/21/17 68  10/10/17 (!) 51  09/05/17 68    Physical Exam  Constitutional: She is oriented to person, place, and time. Vital signs are normal. She appears well-developed and well-nourished. She is cooperative.  HENT:  Head: Normocephalic and atraumatic.  Mouth/Throat: Oropharynx is clear and moist and mucous membranes are normal.  Eyes: Pupils are equal, round, and reactive to light. Conjunctivae are normal.  Cardiovascular: Normal rate, regular rhythm and normal heart sounds.  Pulmonary/Chest: Effort normal and breath sounds normal.  Neurological: She is alert and oriented to person, place, and time. Gait normal.  Skin: Skin is warm, dry and intact. Rash noted. Rash is urticarial.  Excoriations to back, left forearm left lower leg with hives to left forearm   Psychiatric: She has a normal mood and affect. Her speech is normal and behavior is normal. Judgment and thought content  normal. Cognition and memory are normal.  Nursing note and vitals reviewed.   Assessment   1. Acute urticaria could be 2/2 insect bite 2.5-3 weeks ago  2. Neuropathy +EMG/NCS 02/2017 ? Etiology low back  with 2009/2010 MRI/Xray low back with degenerative changes  Plan   1. Depo 40 x 1, [facility], sarna, atarax 25 tid prn Hold zyrtec and xanax while using atarax  cmet and cbc today  F/u pcp in 2-4 weeks  2. Xray low back today  Disc alpha lipoic acid to help no longer on gabapentin   Provider: Dr. Olivia Mackie McLean-Scocuzza-Internal Medicine

## 2017-12-21 NOTE — Progress Notes (Signed)
Pre visit review using our clinic review tool, if applicable. No additional management support is needed unless otherwise documented below in the visit note. 

## 2018-01-08 ENCOUNTER — Ambulatory Visit: Payer: Medicare Other | Admitting: Pulmonary Disease

## 2018-01-08 DIAGNOSIS — M0579 Rheumatoid arthritis with rheumatoid factor of multiple sites without organ or systems involvement: Secondary | ICD-10-CM | POA: Diagnosis not present

## 2018-01-08 DIAGNOSIS — M05732 Rheumatoid arthritis with rheumatoid factor of left wrist without organ or systems involvement: Secondary | ICD-10-CM | POA: Diagnosis not present

## 2018-01-08 DIAGNOSIS — M05731 Rheumatoid arthritis with rheumatoid factor of right wrist without organ or systems involvement: Secondary | ICD-10-CM | POA: Diagnosis not present

## 2018-01-09 ENCOUNTER — Ambulatory Visit (INDEPENDENT_AMBULATORY_CARE_PROVIDER_SITE_OTHER): Payer: Medicare Other | Admitting: Pulmonary Disease

## 2018-01-09 ENCOUNTER — Encounter: Payer: Self-pay | Admitting: Pulmonary Disease

## 2018-01-09 VITALS — BP 134/78 | HR 68 | Ht 64.0 in | Wt 152.0 lb

## 2018-01-09 DIAGNOSIS — J189 Pneumonia, unspecified organism: Secondary | ICD-10-CM

## 2018-01-09 DIAGNOSIS — J9601 Acute respiratory failure with hypoxia: Secondary | ICD-10-CM

## 2018-01-09 NOTE — Patient Instructions (Signed)
Discontinue Advair inhaler altogether. Continue albuterol as needed for increased shortness of breath or wheezing We will contact [facility] to remove oxygen from your home  Follow-up as needed for any breathing or lung problems.

## 2018-01-14 NOTE — Progress Notes (Signed)
PULMONARY OFFICE FOLLOW UP NOTE  Requesting MD/Service: [name], MD Date of initial consultation: 10/06/15 Reason for consultation: Recurrent PNAs   PROBLEMS: RA - chronic immunosuppression  Recurrent pneumonias History of positive PPD (S/P INH chemoprophylaxis)  SUBJ: Last seen 04/2017. No pneumonias since that time. Denies all respiratory symptoms. Specifically denies SOB, CP, fever, purulent sputum, hemoptysis, LE edema and calf tenderness. She is still using Advair "occasionally". She uses albuterol MDI 2-3 times per wk. She is still on Nexium.   OBJ:  Vitals:   01/09/18 1446 01/09/18 1451  BP:  134/78  Pulse:  68  SpO2:  98%  Weight: 152 lb (68.9 kg)   Height: 5\' 4"  (1.626 m)   RA  EXAM:  Gen: NAD HEENT: NCAT, sclera white Neck: No JVD Lungs: breath sounds full, no wheezes or other adventitious sounds Cardiovascular: RRR, no murmurs Abdomen: Soft, nontender, normal BS Ext: without clubbing, cyanosis, edema Neuro: grossly intact Skin: Limited exam, no lesions noted   DATA:   BMP Latest Ref Rng & Units 12/21/2017 11/06/2017 10/26/2017  Glucose 70 - 99 mg/dL 97 133(H) 115(H)  BUN 6 - 23 mg/dL 8 9 13   Creatinine 0.40 - 1.20 mg/dL 1.21(H) 1.08 1.32(H)  Sodium 135 - 145 mEq/L 136 134(L) 132(L)  Potassium 3.5 - 5.1 mEq/L 3.5 4.1 4.3  Chloride 96 - 112 mEq/L 98 98 95(L)  CO2 19 - 32 mEq/L 29 30 29   Calcium 8.4 - 10.5 mg/dL 9.8 9.0 9.3    CBC Latest Ref Rng & Units 12/21/2017 10/10/2017 06/26/2017  WBC 4.0 - 10.5 K/uL 6.7 7.3 5.7  Hemoglobin 12.0 - 15.0 g/dL 12.5 12.6 10.5(L)  Hematocrit 36.0 - 46.0 % 36.6 37.3 30.9(L)  Platelets 150.0 - 400.0 K/uL 225.0 199.0 186    CXR: NNF  IMPRESSION: Recurrent pneumonias due to chronic immunosuppression for RA. She did not tolerate discontinuation of PPI (Nexium). She suffered severe rebound gastritis and GERD.   PLAN:  Recommended that she discontinue Advair inhaler altogether. Continue albuterol as needed for  increased shortness of breath or wheezing We will contact [facility] to remove oxygen from your home (she is not using and does not require it)  Follow-up as needed for any breathing or lung problems.    Merton Border, MD PCCM service Mobile [phone] Pager [phone] 01/14/2018 8:30 PM

## 2018-01-19 ENCOUNTER — Other Ambulatory Visit: Payer: Self-pay | Admitting: Internal Medicine

## 2018-01-19 ENCOUNTER — Ambulatory Visit (INDEPENDENT_AMBULATORY_CARE_PROVIDER_SITE_OTHER): Payer: Medicare Other | Admitting: Internal Medicine

## 2018-01-19 ENCOUNTER — Encounter: Payer: Self-pay | Admitting: Internal Medicine

## 2018-01-19 DIAGNOSIS — G4733 Obstructive sleep apnea (adult) (pediatric): Secondary | ICD-10-CM

## 2018-01-19 DIAGNOSIS — R600 Localized edema: Secondary | ICD-10-CM

## 2018-01-19 DIAGNOSIS — I4891 Unspecified atrial fibrillation: Secondary | ICD-10-CM | POA: Diagnosis not present

## 2018-01-19 DIAGNOSIS — K219 Gastro-esophageal reflux disease without esophagitis: Secondary | ICD-10-CM

## 2018-01-19 DIAGNOSIS — I10 Essential (primary) hypertension: Secondary | ICD-10-CM

## 2018-01-19 DIAGNOSIS — G479 Sleep disorder, unspecified: Secondary | ICD-10-CM

## 2018-01-19 DIAGNOSIS — D649 Anemia, unspecified: Secondary | ICD-10-CM | POA: Diagnosis not present

## 2018-01-19 DIAGNOSIS — R252 Cramp and spasm: Secondary | ICD-10-CM

## 2018-01-19 DIAGNOSIS — M069 Rheumatoid arthritis, unspecified: Secondary | ICD-10-CM | POA: Diagnosis not present

## 2018-01-19 DIAGNOSIS — I872 Venous insufficiency (chronic) (peripheral): Secondary | ICD-10-CM | POA: Diagnosis not present

## 2018-01-19 DIAGNOSIS — F419 Anxiety disorder, unspecified: Secondary | ICD-10-CM

## 2018-01-19 DIAGNOSIS — E7800 Pure hypercholesterolemia, unspecified: Secondary | ICD-10-CM

## 2018-01-19 LAB — HEPATIC FUNCTION PANEL
ALK PHOS: 35 U/L — AB (ref 39–117)
ALT: 37 U/L — AB (ref 0–35)
AST: 53 U/L — ABNORMAL HIGH (ref 0–37)
Albumin: 4.1 g/dL (ref 3.5–5.2)
BILIRUBIN DIRECT: 0.1 mg/dL (ref 0.0–0.3)
TOTAL PROTEIN: 6.9 g/dL (ref 6.0–8.3)
Total Bilirubin: 0.5 mg/dL (ref 0.2–1.2)

## 2018-01-19 LAB — BASIC METABOLIC PANEL
BUN: 9 mg/dL (ref 6–23)
CALCIUM: 9.2 mg/dL (ref 8.4–10.5)
CO2: 29 mEq/L (ref 19–32)
CREATININE: 1.19 mg/dL (ref 0.40–1.20)
Chloride: 96 mEq/L (ref 96–112)
GFR: 47.03 mL/min — AB (ref >60.00)
Glucose, Bld: 110 mg/dL — ABNORMAL HIGH (ref 70–99)
Potassium: 4.5 mEq/L (ref 3.5–5.1)
Sodium: 132 mEq/L — ABNORMAL LOW (ref 135–145)

## 2018-01-19 NOTE — Progress Notes (Signed)
Patient ID: Kaitlyn Huff, female   DOB: [birth year], 77 y.o.   MRN: [id number]   Subjective:    Patient ID: Kaitlyn Huff, female    DOB: July 11, 1941, 78 y.o.   MRN: [id number]  HPI  Patient here for a scheduled follow up.  She saw Dr Ubaldo Glassing 11/2017.  Was recently hospitalized with colitis.  Noted to be in afib with RVR.  Discharged on amiodarone and metoprolol.  Has done relatively well since discharge.  Edema worsening.  He started her on chlorthalidone.  EF normal.  Moderate AI and mild to moderate MR.  Also had f/u with Dr Alva Garnet 01/09/18 for recurrent pneumonias due to chronic immunosuppression for RA.  Recommended discontinuing advair.  Has albuterol if needed.  Continue nexium.  Controls acid reflux.  Has noticed some increased cramping in her feet.  Feet will draw. States has been present for approximately two months.  Does not relate to starting chlorthalidone. She also reports not sleeping.  She feels this is affecting her appetite.  No energy. No chest pain.  Concerned regarding persistent swelling.  Unable to wear compression hose.     Past Medical History:  Diagnosis Date  . Anemia   . Cancer ([place])   . Diverticulitis   . GERD (gastroesophageal reflux disease)   . Hyperlipidemia   . Hypertension   . Osteoarthritis   . Osteoporosis    actonel  . Positive PPD    s/p INH (2006)  . Rheumatoid arthritis(714.0)    MTX transaminitis, Leflunomide (rash), enbrel, plaquinil, prednisone, remicade, Imuran (transaminitis)  . 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 ABDOMINAL HYSTERECTOMY    . CERVICAL CONE BIOPSY    . CHOLECYSTECTOMY  06/22/14  . COLONOSCOPY WITH PROPOFOL N/A 07/09/2015   Procedure: COLONOSCOPY WITH PROPOFOL;  Surgeon: Manya Silvas, MD;  Location: [facility] ENDOSCOPY;  Service: Endoscopy;  Laterality: N/A;  . ESOPHAGOGASTRODUODENOSCOPY (EGD) WITH PROPOFOL N/A 07/09/2015   Procedure: ESOPHAGOGASTRODUODENOSCOPY  (EGD) WITH PROPOFOL;  Surgeon: Manya Silvas, MD;  Location: [facility] ENDOSCOPY;  Service: Endoscopy;  Laterality: N/A;  . OVARY SURGERY    . TRACHEOSTOMY  1959  . TUBAL LIGATION     Family History  Problem Relation Age of Onset  . Heart disease Father        MI  . Heart disease Mother   . Valvular heart disease Mother   . Breast cancer Sister 41  . Cancer Sister        Lung cancer  . COPD Sister   . Heart disease Sister   . Colon cancer Neg Hx    Social History   Socioeconomic History  . Marital status: Widowed    Spouse name: Not on file  . Number of children: 4  . Years of education: Not on file  . Highest education level: Not on file  Occupational History  . Occupation: retired  Scientific laboratory technician  . Financial resource strain: Not on file  . Food insecurity:    Worry: Not on file    Inability: Not on file  . Transportation needs:    Medical: Not on file    Non-medical: Not on file  Tobacco Use  . Smoking status: Never Smoker  . Smokeless tobacco: Never Used  Substance and Sexual Activity  . Alcohol use: No    Alcohol/week: 0.0 oz  . Drug use: No  . Sexual activity: Never  Lifestyle  .  Physical activity:    Days per week: Not on file    Minutes per session: Not on file  . Stress: Not on file  Relationships  . Social connections:    Talks on phone: Not on file    Gets together: Not on file    Attends religious service: Not on file    Active member of club or organization: Not on file    Attends meetings of clubs or organizations: Not on file    Relationship status: Not on file  Other Topics Concern  . Not on file  Social History Narrative   Lives with family at home    Outpatient Encounter Medications as of 01/19/2018  Medication Sig  . acetaminophen (TYLENOL) 325 MG tablet Take 2 tablets (650 mg total) by mouth every 6 (six) hours as needed for mild pain (or Fever >/= 101).  Marland Kitchen acidophilus (RISAQUAD) CAPS capsule Take 1 capsule by mouth daily.  Marland Kitchen albuterol  (PROAIR HFA) 108 (90 Base) MCG/ACT inhaler USE 2 INHALATIONS EVERY 6 HOURS AS NEEDED FOR WHEEZING OR SHORTNESS OF BREATH  . alendronate (FOSAMAX) 70 MG tablet Take 70 mg by mouth once a week.   . ALPRAZolam (XANAX) 0.25 MG tablet Take 1 tablet (0.25 mg total) by mouth daily as needed for anxiety.  Marland Kitchen amiodarone (PACERONE) 200 MG tablet Take 1 tablet by mouth daily.  Marland Kitchen aspirin EC 81 MG tablet Take 81 mg by mouth daily.  . Calcium Carbonate-Vitamin D (CALCIUM 600+D) 600-400 MG-UNIT tablet Take 1 tablet by mouth 2 (two) times daily.   . cetirizine (ZYRTEC) 10 MG tablet Take 1 tablet (10 mg total) by mouth daily.  . cholecalciferol (VITAMIN D) 1000 units tablet Take 1,000 Units by mouth daily.  . colestipol (COLESTID) 1 g tablet Take 2 g by mouth daily.   Marland Kitchen esomeprazole (NEXIUM) 20 MG capsule Take 20 mg by mouth daily at 12 noon.  . Fe Fum-FePoly-Vit C-Vit B3 (INTEGRA) 62.5-62.5-40-3 MG CAPS One tablet per day  . FeFum-FePo-FA-B Cmp-C-Zn-Mn-Cu (TANDEM PLUS) 162-115.2-1 MG CAPS TAKE 1 CAPSULE DAILY  . fluticasone (FLONASE) 50 MCG/ACT nasal spray Place 2 sprays into both nostrils daily. (Patient taking differently: Place 2 sprays into both nostrils 2 (two) times daily. )  . guaiFENesin-dextromethorphan (ROBITUSSIN DM) 100-10 MG/5ML syrup Take 5 mLs by mouth every 4 (four) hours as needed for cough.  . hydroxychloroquine (PLAQUENIL) 200 MG tablet Take 200 mg by mouth 2 (two) times daily.   . hydrOXYzine (ATARAX/VISTARIL) 25 MG tablet Take 1 tablet (25 mg total) by mouth 3 (three) times daily as needed. If you take at night do not take with xanax  . lip balm (BLISTEX) OINT Apply 1 application topically as needed for lip care.  . meclizine (ANTIVERT) 25 MG tablet take 1/2 to 1 tablet every 8 hours if needed for VERTIGO  . Multiple Vitamin (MULTIVITAMIN WITH MINERALS) TABS tablet Take 1 tablet by mouth daily.  . ondansetron (ZOFRAN ODT) 4 MG disintegrating tablet Take 1 tablet (4 mg total) by mouth every 8  (eight) hours as needed for nausea or vomiting.  . potassium chloride (KLOR-CON SPRINKLE) 10 MEQ CR capsule TAKE 2 CAPSULES Once a day  . predniSONE (DELTASONE) 5 MG tablet Take 1 tablet (5 mg total) by mouth daily with breakfast.  . sodium chloride (OCEAN) 0.65 % nasal spray Place 1 spray into the nose as needed for congestion.  . traMADol (ULTRAM) 50 MG tablet Take 50 mg by mouth 2 (two) times daily  as needed for moderate pain.  Marland Kitchen triamcinolone cream (KENALOG) 0.1 % Apply 1 application topically 2 (two) times daily. Prn  . [DISCONTINUED] simvastatin (ZOCOR) 20 MG tablet TAKE 1 TABLET DAILY   No facility-administered encounter medications on file as of 01/19/2018.     Review of Systems  Constitutional: Negative for appetite change and unexpected weight change.  HENT: Negative for congestion and sinus pressure.   Respiratory: Negative for cough and chest tightness.        Breathing stable.   Cardiovascular: Positive for leg swelling. Negative for chest pain and palpitations.  Gastrointestinal: Negative for abdominal pain, diarrhea, nausea and vomiting.  Genitourinary: Negative for difficulty urinating and dysuria.  Musculoskeletal: Negative for joint swelling and myalgias.  Skin: Negative for color change and rash.  Neurological: Negative for dizziness, light-headedness and headaches.  Psychiatric/Behavioral: Negative for agitation and dysphoric mood.       Objective:    Physical Exam  Constitutional: She appears well-developed and well-nourished. No distress.  HENT:  Nose: Nose normal.  Mouth/Throat: Oropharynx is clear and moist.  Neck: Neck supple. No thyromegaly present.  Cardiovascular: Normal rate and regular rhythm.  Pulmonary/Chest: Breath sounds normal. No respiratory distress. She has no wheezes.  Abdominal: Soft. Bowel sounds are normal. There is no tenderness.  Musculoskeletal: She exhibits no tenderness.  Minimal lower extremity swelling.   Lymphadenopathy:    She  has no cervical adenopathy.  Skin: No rash noted. No erythema.  Psychiatric: She has a normal mood and affect. Her behavior is normal.    BP 118/62 (BP Location: Left Arm, Patient Position: Sitting, Cuff Size: Normal)   Pulse 71   Wt 152 lb 12.8 oz (69.3 kg)   SpO2 94%   BMI 26.23 kg/m  Wt Readings from Last 3 Encounters:  01/19/18 152 lb 12.8 oz (69.3 kg)  01/09/18 152 lb (68.9 kg)  12/21/17 157 lb (71.2 kg)     Lab Results  Component Value Date   WBC 6.7 12/21/2017   HGB 12.5 12/21/2017   HCT 36.6 12/21/2017   PLT 225.0 12/21/2017   GLUCOSE 110 (H) 01/19/2018   CHOL 142 07/14/2017   TRIG 112.0 07/14/2017   HDL 79.10 07/14/2017   LDLDIRECT 73.7 08/20/2013   LDLCALC 40 07/14/2017   ALT 37 (H) 01/19/2018   AST 53 (H) 01/19/2018   NA 132 (L) 01/19/2018   K 4.5 01/19/2018   CL 96 01/19/2018   CREATININE 1.19 01/19/2018   BUN 9 01/19/2018   CO2 29 01/19/2018   TSH 2.56 10/10/2017   INR 1.28 10/31/2016    Ct Abdomen Pelvis W Contrast  Result Date: 06/24/2017 CLINICAL DATA:  Sudden onset of abdominal pain. EXAM: CT ABDOMEN AND PELVIS WITH CONTRAST TECHNIQUE: Multidetector CT imaging of the abdomen and pelvis was performed using the standard protocol following bolus administration of intravenous contrast. CONTRAST:  153mL ISOVUE-300 IOPAMIDOL (ISOVUE-300) INJECTION 61% COMPARISON:  Abdominal ultrasound 06/12/2017.  CT 06/19/2016 FINDINGS: Lower chest: Mild cardiomegaly. No pleural fluid or consolidation. Moderate hiatal hernia with minimal fluid in the intrathoracic stomach. Hepatobiliary: Decreased hepatic density consistent with steatosis. Postcholecystectomy with clips in the gallbladder fossa. Stable dilatation of the common bile duct post cholecystectomy measuring 10 mm distally. Pancreas: No ductal dilatation or inflammation. 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 Spleen is normal in size. Adrenals/Urinary Tract:  Normal adrenal glands. No hydronephrosis. Homogeneous renal enhancement with symmetric excretion on delayed phase imaging. Symmetric mild bilateral perinephric edema is unchanged  from prior exam. Urinary bladder is distended without wall thickening. Stomach/Bowel: Moderate hiatal hernia. Small amount of fluid in the intrathoracic and subdiaphragmatic stomach mild distal gastric wall thickening. No small bowel obstruction or inflammation. Majority of the colon is decompressed. Possible wall thickening involving the descending and proximal sigmoid colon. Descending and sigmoid diverticulosis without acute diverticulitis. Normal appendix. Vascular/Lymphatic: Scattered aortic atherosclerosis. No aneurysm. Prominent right inguinal nodes measuring up to 10 mm, likely reactive. No enlarged abdominal lymph nodes. Reproductive: Status post hysterectomy. No adnexal masses. Other: No free air, free fluid, or intra-abdominal fluid collection. Small fat containing umbilical hernia. Musculoskeletal: Bones are under mineralized with multilevel degenerative change in the spine. IMPRESSION: 1. Equivocal colonic wall thickening of the descending and proximal sigmoid colon versus nondistention. Possible mild colitis. 2. Moderate hiatal hernia with some intraluminal fluid, as well as mild distal gastric wall thickening, nonspecific but can be seen with gastritis. 3. Hepatic steatosis. Chronic biliary prominence postcholecystectomy. Electronically Signed   By: Jeb Levering M.D.   On: 06/24/2017 03:20       Assessment & Plan:   Problem List Items Addressed This Visit    A-fib ([place])    On amiodarone.  Appears to be in SR.  Followed by cardiology.  Recently evaluated.  On chlorthalidone.  Stable.        Anemia    Follow cbc.       Anxiety    Increased anxiety.  Not sleeping.  On xanax.  Discussed with her today.  Agrees to have psychiatry evaluation and to help with further treatment.        Relevant Orders    Ambulatory referral to Psychiatry   Chronic venous insufficiency    Has chronic venous insufficiency.  With persistent increased leg swelling.  Unable to wear compression hose.  Agrees with vascular surgery evaluation for further treatment.        Difficulty sleeping    Persistent difficulty sleeping and with anxiety.  On xanax. Has been a persistent issue for her.  Will have psychiatry evaluate for further treatment.        Relevant Orders   Ambulatory referral to Psychiatry   GERD (gastroesophageal reflux disease)    Controlled on nexium.        Hypercholesterolemia    On simvastatin.  Low cholesterol diet and exercise.  Follow lipid panel and liver function tests.        Hypertension    Blood pressure under good control.  Continue same medication regimen.  Follow pressures.  Follow metabolic panel.        Relevant Orders   Basic metabolic panel (Completed)   Lower extremity edema    Persistent problem.  Unable to wear compression hose.  On chlorthalidone.  Continue leg elevation.  After discussion, will refer to vascular surgery for further treatment.         Relevant Orders   Hepatic function panel (Completed)   Ambulatory referral to Vascular Surgery   Obstructive sleep apnea    Declines cpap.        Rheumatoid arthritis ([place])    Followed by Dr [name].         Other Visit Diagnoses    Leg cramps       Feet/leg cramps as outlined.  Check electrolytes.  On chlorthalidone.         [name], MD

## 2018-01-22 ENCOUNTER — Other Ambulatory Visit: Payer: Self-pay | Admitting: Internal Medicine

## 2018-01-22 ENCOUNTER — Encounter: Payer: Self-pay | Admitting: Internal Medicine

## 2018-01-22 DIAGNOSIS — R945 Abnormal results of liver function studies: Secondary | ICD-10-CM

## 2018-01-22 DIAGNOSIS — E871 Hypo-osmolality and hyponatremia: Secondary | ICD-10-CM

## 2018-01-22 DIAGNOSIS — R7989 Other specified abnormal findings of blood chemistry: Secondary | ICD-10-CM

## 2018-01-22 NOTE — Assessment & Plan Note (Signed)
Persistent difficulty sleeping and with anxiety.  On xanax. Has been a persistent issue for her.  Will have psychiatry evaluate for further treatment.

## 2018-01-22 NOTE — Assessment & Plan Note (Signed)
Persistent problem.  Unable to wear compression hose.  On chlorthalidone.  Continue leg elevation.  After discussion, will refer to vascular surgery for further treatment.

## 2018-01-22 NOTE — Progress Notes (Signed)
Order placed for GI referral.   

## 2018-01-22 NOTE — Assessment & Plan Note (Signed)
Controlled on nexium.  

## 2018-01-22 NOTE — Assessment & Plan Note (Signed)
Follow cbc.  

## 2018-01-22 NOTE — Assessment & Plan Note (Signed)
Increased anxiety.  Not sleeping.  On xanax.  Discussed with her today.  Agrees to have psychiatry evaluation and to help with further treatment.

## 2018-01-22 NOTE — Assessment & Plan Note (Signed)
Blood pressure under good control.  Continue same medication regimen.  Follow pressures.  Follow metabolic panel.   

## 2018-01-22 NOTE — Assessment & Plan Note (Signed)
Declines cpap.  

## 2018-01-22 NOTE — Assessment & Plan Note (Signed)
Followed by Dr Kernodle.   

## 2018-01-22 NOTE — Assessment & Plan Note (Signed)
On simvastatin.  Low cholesterol diet and exercise.  Follow lipid panel and liver function tests.   

## 2018-01-22 NOTE — Assessment & Plan Note (Signed)
On amiodarone.  Appears to be in SR.  Followed by cardiology.  Recently evaluated.  On chlorthalidone.  Stable.

## 2018-01-22 NOTE — Progress Notes (Signed)
Order placed for f/u lab.   

## 2018-01-22 NOTE — Assessment & Plan Note (Signed)
Has chronic venous insufficiency.  With persistent increased leg swelling.  Unable to wear compression hose.  Agrees with vascular surgery evaluation for further treatment.

## 2018-01-25 ENCOUNTER — Ambulatory Visit (INDEPENDENT_AMBULATORY_CARE_PROVIDER_SITE_OTHER): Payer: Medicare Other | Admitting: Vascular Surgery

## 2018-01-29 ENCOUNTER — Ambulatory Visit (INDEPENDENT_AMBULATORY_CARE_PROVIDER_SITE_OTHER): Payer: Medicare Other | Admitting: Vascular Surgery

## 2018-01-29 ENCOUNTER — Encounter (INDEPENDENT_AMBULATORY_CARE_PROVIDER_SITE_OTHER): Payer: Self-pay | Admitting: Vascular Surgery

## 2018-01-29 VITALS — BP 140/74 | HR 67 | Resp 12 | Ht 64.0 in | Wt 150.0 lb

## 2018-01-29 DIAGNOSIS — I872 Venous insufficiency (chronic) (peripheral): Secondary | ICD-10-CM

## 2018-01-29 DIAGNOSIS — M79605 Pain in left leg: Secondary | ICD-10-CM

## 2018-01-29 DIAGNOSIS — I10 Essential (primary) hypertension: Secondary | ICD-10-CM | POA: Diagnosis not present

## 2018-01-29 DIAGNOSIS — G2581 Restless legs syndrome: Secondary | ICD-10-CM

## 2018-01-29 DIAGNOSIS — M79604 Pain in right leg: Secondary | ICD-10-CM

## 2018-01-29 DIAGNOSIS — I4891 Unspecified atrial fibrillation: Secondary | ICD-10-CM

## 2018-01-29 NOTE — Progress Notes (Signed)
MRN : [id number]  Kaitlyn Huff is a 77 y.o. ([birth year]) female who presents with chief complaint of  Chief Complaint  Patient presents with  . Follow-up    Bilateral Leg Swelling and pain  .  History of Present Illness: The patient is seen for evaluation of painful lower extremities. Patient notes the pain is variable and not always associated with activity.  The pain is somewhat consistent day to day occurring on most days. The patient notes the pain also occurs with standing and routinely seems worse as the day wears on. The pain has been progressive over the past several years. The patient states these symptoms are causing  a profound negative impact on quality of life and daily activities.  The patient denies rest pain or dangling of an extremity off the side of the bed during the night for relief. No open wounds or sores at this time. No history of DVT or phlebitis. No prior interventions or surgeries.  There is a  history of back problems and DJD of the lumbar and sacral spine.    Current Meds  Medication Sig  . acetaminophen (TYLENOL) 325 MG tablet Take 2 tablets (650 mg total) by mouth every 6 (six) hours as needed for mild pain (or Fever >/= 101).  Marland Kitchen acidophilus (RISAQUAD) CAPS capsule Take 1 capsule by mouth daily.  Marland Kitchen albuterol (PROAIR HFA) 108 (90 Base) MCG/ACT inhaler USE 2 INHALATIONS EVERY 6 HOURS AS NEEDED FOR WHEEZING OR SHORTNESS OF BREATH  . alendronate (FOSAMAX) 70 MG tablet Take 70 mg by mouth once a week.   . ALPRAZolam (XANAX) 0.25 MG tablet Take 1 tablet (0.25 mg total) by mouth daily as needed for anxiety.  Marland Kitchen aspirin EC 81 MG tablet Take 81 mg by mouth daily.  . Calcium Carbonate-Vitamin D (CALCIUM 600+D) 600-400 MG-UNIT tablet Take 1 tablet by mouth 2 (two) times daily.   . cetirizine (ZYRTEC) 10 MG tablet Take 1 tablet (10 mg total) by mouth daily.  . cholecalciferol (VITAMIN D) 1000 units tablet Take 1,000 Units by mouth daily.  . colestipol (COLESTID)  1 g tablet Take 2 g by mouth daily.   Marland Kitchen esomeprazole (NEXIUM) 20 MG capsule Take 20 mg by mouth daily at 12 noon.  . Fe Fum-FePoly-Vit C-Vit B3 (INTEGRA) 62.5-62.5-40-3 MG CAPS One tablet per day  . FeFum-FePo-FA-B Cmp-C-Zn-Mn-Cu (TANDEM PLUS) 162-115.2-1 MG CAPS TAKE 1 CAPSULE DAILY  . fluticasone (FLONASE) 50 MCG/ACT nasal spray Place 2 sprays into both nostrils daily. (Patient taking differently: Place 2 sprays into both nostrils 2 (two) times daily. )  . guaiFENesin-dextromethorphan (ROBITUSSIN DM) 100-10 MG/5ML syrup Take 5 mLs by mouth every 4 (four) hours as needed for cough.  . hydroxychloroquine (PLAQUENIL) 200 MG tablet Take 200 mg by mouth 2 (two) times daily.   . hydrOXYzine (ATARAX/VISTARIL) 25 MG tablet Take 1 tablet (25 mg total) by mouth 3 (three) times daily as needed. If you take at night do not take with xanax  . lip balm (BLISTEX) OINT Apply 1 application topically as needed for lip care.  . meclizine (ANTIVERT) 25 MG tablet take 1/2 to 1 tablet every 8 hours if needed for VERTIGO  . Multiple Vitamin (MULTIVITAMIN WITH MINERALS) TABS tablet Take 1 tablet by mouth daily.  . ondansetron (ZOFRAN ODT) 4 MG disintegrating tablet Take 1 tablet (4 mg total) by mouth every 8 (eight) hours as needed for nausea or vomiting.  . potassium chloride (KLOR-CON SPRINKLE) 10 MEQ CR capsule TAKE  2 CAPSULES Once a day  . predniSONE (DELTASONE) 5 MG tablet Take 1 tablet (5 mg total) by mouth daily with breakfast.  . simvastatin (ZOCOR) 20 MG tablet TAKE 1 TABLET DAILY  . sodium chloride (OCEAN) 0.65 % nasal spray Place 1 spray into the nose as needed for congestion.  . traMADol (ULTRAM) 50 MG tablet Take 50 mg by mouth 2 (two) times daily as needed for moderate pain.  Marland Kitchen triamcinolone cream (KENALOG) 0.1 % Apply 1 application topically 2 (two) times daily. Prn    Past Medical History:  Diagnosis Date  . Anemia   . Cancer ([place])   . Diverticulitis   . GERD (gastroesophageal reflux disease)   .  Hyperlipidemia   . Hypertension   . Osteoarthritis   . Osteoporosis    actonel  . Positive PPD    s/p INH (2006)  . Rheumatoid arthritis(714.0)    MTX transaminitis, Leflunomide (rash), enbrel, plaquinil, prednisone, remicade, Imuran (transaminitis)  . Valvular heart disease    moderate MR and TR    Past Surgical History:  Procedure Laterality Date  . ABDOMINAL HYSTERECTOMY    . CERVICAL CONE BIOPSY    . CHOLECYSTECTOMY  06/22/14  . COLONOSCOPY WITH PROPOFOL N/A 07/09/2015   Procedure: COLONOSCOPY WITH PROPOFOL;  Surgeon: Manya Silvas, MD;  Location[facility] ENDOSCOPY;  Service: Endoscopy;  Laterality: N/A;  . ESOPHAGOGASTRODUODENOSCOPY (EGD) WITH PROPOFOL N/A 07/09/2015   Procedure: ESOPHAGOGASTRODUODENOSCOPY (EGD) WITH PROPOFOL;  Surgeon: Manya Silvas, MD;  Location: [facility] ENDOSCOPY;  Service: Endoscopy;  Laterality: N/A;  . OVARY SURGERY    . TRACHEOSTOMY  1959  . TUBAL LIGATION      Social History Social History   Tobacco Use  . Smoking status: Never Smoker  . Smokeless tobacco: Never Used  Substance Use Topics  . Alcohol use: No    Alcohol/week: 0.0 oz  . Drug use: No    Family History Family History  Problem Relation Age of Onset  . Heart disease Father        MI  . Heart disease Mother   . Valvular heart disease Mother   . Breast cancer Sister 59  . Cancer Sister        Lung cancer  . COPD Sister   . Heart disease Sister   . Colon cancer Neg Hx     Allergies  Allergen Reactions  . Astelin [Azelastine Hcl] Other (See Comments)    Reaction:  Unknown   . Codeine Other (See Comments)    Reaction:  Altered mental status  . Dilaudid [Hydromorphone] Other (See Comments)    Reaction:  Unknown   . Flexeril [Cyclobenzaprine] Other (See Comments)    Reaction:  Unknown   . Imuran [Azathioprine] Other (See Comments)    Reaction:  Abnormal liver function  . Lisinopril Itching  . Lyrica [Pregabalin] Other (See Comments)    Reaction:  Sore gums   .  Methotrexate Derivatives Other (See Comments)    Reaction:  Abnormal liver function  . Amoxicillin Rash and Other (See Comments)    Unable to obtain enough information to answer additional questions about this medication.    Jolee Ewing [Leflunomide] Rash  . Clindamycin/Lincomycin Rash  . Doxycycline Rash  . Lodine [Etodolac] Rash  . Percocet [Oxycodone-Acetaminophen] Rash  . Sulfa Antibiotics Rash     REVIEW OF SYSTEMS (Negative unless checked)  Constitutional: [] Weight loss  [] Fever  [] Chills Cardiac: [] Chest pain   [] Chest pressure   [] Palpitations   [] Shortness  of breath when laying flat   [] Shortness of breath with exertion. Vascular:  [] Pain in legs with walking   [x] Pain in legs at rest  [] History of DVT   [] Phlebitis   [x] Swelling in legs   [x] Varicose veins   [] Non-healing ulcers Pulmonary:   [] Uses home oxygen   [] Productive cough   [] Hemoptysis   [] Wheeze  [] COPD   [] Asthma Neurologic:  [] Dizziness   [] Seizures   [] History of stroke   [] History of TIA  [] Aphasia   [] Vissual changes   [] Weakness or numbness in arm   [] Weakness or numbness in leg Musculoskeletal:   [] Joint swelling   [x] Joint pain   [x] Low back pain Hematologic:  [] Easy bruising  [] Easy bleeding   [] Hypercoagulable state   [] Anemic Gastrointestinal:  [] Diarrhea   [] Vomiting  [] Gastroesophageal reflux/heartburn   [] Difficulty swallowing. Genitourinary:  [] Chronic kidney disease   [] Difficult urination  [] Frequent urination   [] Blood in urine Skin:  [] Rashes   [] Ulcers  Psychological:  [] History of anxiety   []  History of major depression.  Physical Examination  Vitals:   01/29/18 1026  BP: 140/74  Pulse: 67  Resp: 12  Weight: 150 lb (68 kg)  Height: 5\' 4"  (1.626 m)   Body mass index is 25.75 kg/m. Gen: WD/WN, NAD Head: [place]/AT, No temporalis wasting.  Ear/Nose/Throat: Hearing grossly intact, nares w/o erythema or drainage Eyes: PER, EOMI, sclera nonicteric.  Neck: Supple, no large masses.   Pulmonary:   Good air movement, no audible wheezing bilaterally, no use of accessory muscles.  Cardiac: RRR, no JVD Vascular: scattered varicosities present bilaterally.  Mild venous stasis changes to the legs bilaterally.  2+ soft pitting edema Vessel Right Left  Radial Palpable Palpable  PT Palpable Palpable  DP Palpable Palpable  Gastrointestinal: Non-distended. No guarding/no peritoneal signs.  Musculoskeletal: M/S 5/5 throughout.  No deformity or atrophy.  Neurologic: CN 2-12 intact. Symmetrical.  Speech is fluent. Motor exam as listed above. Psychiatric: Judgment intact, Mood & affect appropriate for pt's clinical situation. Dermatologic: mild venous rashes no ulcers noted.  No changes consistent with cellulitis. Lymph : No lichenification or skin changes of chronic lymphedema.  CBC Lab Results  Component Value Date   WBC 6.7 12/21/2017   HGB 12.5 12/21/2017   HCT 36.6 12/21/2017   MCV 94.0 12/21/2017   PLT 225.0 12/21/2017    BMET    Component Value Date/Time   NA 132 (L) 01/19/2018 1406   NA 140 11/03/2014 0441   K 4.5 01/19/2018 1406   K 3.7 11/03/2014 0441   CL 96 01/19/2018 1406   CL 108 11/03/2014 0441   CO2 29 01/19/2018 1406   CO2 23 11/03/2014 0441   GLUCOSE 110 (H) 01/19/2018 1406   GLUCOSE 133 (H) 11/03/2014 0441   BUN 9 01/19/2018 1406   BUN 16 11/03/2014 0441   CREATININE 1.19 01/19/2018 1406   CREATININE 1.35 (H) 12/18/2015 1603   CALCIUM 9.2 01/19/2018 1406   CALCIUM 7.6 (L) 11/03/2014 0441   GFRNONAA 60 (L) 06/26/2017 0538   GFRNONAA >60 11/03/2014 0441   GFRAA >60 06/26/2017 0538   GFRAA >60 11/03/2014 0441   Estimated Creatinine Clearance: 39.3 mL/min (by C-G formula based on SCr of 1.19 mg/dL).  COAG Lab Results  Component Value Date   INR 1.28 10/31/2016   INR 1.13 10/31/2016   INR 1.05 06/16/2016    Radiology No results found.    Assessment/Plan 1. Pain in both lower extremities  Recommend:  The patient has  atypical pain symptoms for  pure atherosclerotic disease. However, on physical exam there is evidence of mixed venous and arterial disease, given the diminished pulses and the edema associated with venous changes of the legs.  Noninvasive studies including venous ultrasound of the legs will be obtained and the patient will follow up with me to review these studies.  The patient should continue walking and begin a more formal exercise program. The patient should continue his antiplatelet therapy and aggressive treatment of the lipid abnormalities.  The patient should begin wearing graduated compression socks 15-20 mmHg strength to control edema.   - VAS [place] LOWER EXTREMITY VENOUS (DVT); Future  2. Chronic venous insufficiency No surgery or intervention at this point in time.    I have had a long discussion with the patient regarding venous insufficiency and why it  causes symptoms. I have discussed with the patient the chronic skin changes that accompany venous insufficiency and the long term sequela such as infection and ulceration.  Patient will begin wearing graduated compression stockings class 1 (20-30 mmHg) or compression wraps on a daily basis a prescription was given. The patient will put the stockings on first thing in the morning and removing them in the evening. The patient is instructed specifically not to sleep in the stockings.    In addition, behavioral modification including several periods of elevation of the lower extremities during the day will be continued. I have demonstrated that proper elevation is a position with the ankles at heart level.  The patient is instructed to begin routine exercise, especially walking on a daily basis  Patient should undergo duplex ultrasound of the venous system to ensure that DVT or reflux is not present.   3. Essential hypertension Continue antihypertensive medications as already ordered, these medications have been reviewed and there are no changes at this  time.   4. Restless leg syndrome The patient has a long history of restless leg syndrome.  She was recently taken off gabapentin.  On questioning the symptoms were occurring while on gabapentin and I would assume that the gabapentin was stopped secondary to lack of efficacy.  She is unable to tolerate Lyrica.  However, the leg pain symptoms could also be related to degenerative spine disease.  I would defer work-up of this process to her primary service.  5. Atrial fibrillation, unspecified type ([place]) Continue antiarrhythmia medications as already ordered, these medications have been reviewed and there are no changes at this time.  Continue anticoagulation as ordered by Cardiology Service     Hortencia Pilar, MD  01/29/2018 12:53 PM

## 2018-01-30 ENCOUNTER — Other Ambulatory Visit: Payer: Medicare Other

## 2018-02-01 ENCOUNTER — Other Ambulatory Visit (INDEPENDENT_AMBULATORY_CARE_PROVIDER_SITE_OTHER): Payer: Medicare Other

## 2018-02-01 DIAGNOSIS — E871 Hypo-osmolality and hyponatremia: Secondary | ICD-10-CM | POA: Diagnosis not present

## 2018-02-01 LAB — BASIC METABOLIC PANEL
BUN: 10 mg/dL (ref 6–23)
CO2: 26 mEq/L (ref 19–32)
CREATININE: 1.1 mg/dL (ref 0.40–1.20)
Calcium: 8.9 mg/dL (ref 8.4–10.5)
Chloride: 100 mEq/L (ref 96–112)
GFR: 51.5 mL/min — ABNORMAL LOW (ref >60.00)
Glucose, Bld: 131 mg/dL — ABNORMAL HIGH (ref 70–99)
POTASSIUM: 4 meq/L (ref 3.5–5.1)
Sodium: 134 mEq/L — ABNORMAL LOW (ref 135–145)

## 2018-02-14 ENCOUNTER — Other Ambulatory Visit: Payer: Self-pay | Admitting: Internal Medicine

## 2018-02-14 NOTE — Telephone Encounter (Signed)
Copied from [place] [id number]. Topic: Quick Communication - Rx Refill/Question >> Feb 14, 2018  3:27 PM Waylan Rocher, Lumin L wrote: Medication: ALPRAZolam (XANAX) 0.25 MG tablet (90 day supply) (Also needs a short supply sent to [facility])  Has the patient contacted their pharmacy? Yes.   (Agent: If no, request that the patient contact the pharmacy for the refill.) (Agent: If yes, when and what did the pharmacy advise?)  Preferred Pharmacy (with phone number or street name): [place], [place] [address] [place] [address] Phone: [phone] Fax: [phone]  Agent: Please be advised that RX refills may take up to 3 business days. We ask that you follow-up with your pharmacy.

## 2018-02-14 NOTE — Telephone Encounter (Signed)
Xanax 90 day supply  Last OV:01/19/18 Last refill:12/06/17 90 tab/0 refill XIP:PNDLO Pharmacy: [place], [place] [phone] (Phone) [phone] (Fax)   Xanax short supply to local pharmacy  [facility], [place] [phone] (Phone) [phone] (Fax)

## 2018-02-15 ENCOUNTER — Other Ambulatory Visit: Payer: Self-pay | Admitting: Internal Medicine

## 2018-02-15 MED ORDER — ALPRAZOLAM 0.25 MG PO TABS: 0.2500 mg | ORAL_TABLET | Freq: Every day | ORAL | 0 refills | Status: DC | PRN

## 2018-02-15 NOTE — Telephone Encounter (Signed)
Last filled 12/06/17 and last seen 01/19/18.  ok'd xanax #90 with no refills.

## 2018-02-15 NOTE — Telephone Encounter (Signed)
rx request Patient last seen 12-06-17 This patient was last filled on 01-19-18

## 2018-02-16 NOTE — Telephone Encounter (Signed)
Faxed to pharmacy

## 2018-02-21 DIAGNOSIS — I480 Paroxysmal atrial fibrillation: Secondary | ICD-10-CM | POA: Diagnosis not present

## 2018-02-21 DIAGNOSIS — G4733 Obstructive sleep apnea (adult) (pediatric): Secondary | ICD-10-CM | POA: Diagnosis not present

## 2018-02-21 DIAGNOSIS — J441 Chronic obstructive pulmonary disease with (acute) exacerbation: Secondary | ICD-10-CM | POA: Diagnosis not present

## 2018-02-21 DIAGNOSIS — E782 Mixed hyperlipidemia: Secondary | ICD-10-CM | POA: Diagnosis not present

## 2018-02-21 DIAGNOSIS — I10 Essential (primary) hypertension: Secondary | ICD-10-CM | POA: Diagnosis not present

## 2018-02-21 DIAGNOSIS — I38 Endocarditis, valve unspecified: Secondary | ICD-10-CM | POA: Diagnosis not present

## 2018-02-21 DIAGNOSIS — I059 Rheumatic mitral valve disease, unspecified: Secondary | ICD-10-CM | POA: Diagnosis not present

## 2018-03-01 ENCOUNTER — Encounter (INDEPENDENT_AMBULATORY_CARE_PROVIDER_SITE_OTHER): Payer: Medicare Other

## 2018-03-01 ENCOUNTER — Ambulatory Visit (INDEPENDENT_AMBULATORY_CARE_PROVIDER_SITE_OTHER): Payer: Medicare Other | Admitting: Vascular Surgery

## 2018-03-13 DIAGNOSIS — M810 Age-related osteoporosis without current pathological fracture: Secondary | ICD-10-CM | POA: Diagnosis not present

## 2018-03-13 DIAGNOSIS — M0579 Rheumatoid arthritis with rheumatoid factor of multiple sites without organ or systems involvement: Secondary | ICD-10-CM | POA: Diagnosis not present

## 2018-03-13 DIAGNOSIS — R601 Generalized edema: Secondary | ICD-10-CM | POA: Diagnosis not present

## 2018-03-13 DIAGNOSIS — M05732 Rheumatoid arthritis with rheumatoid factor of left wrist without organ or systems involvement: Secondary | ICD-10-CM | POA: Diagnosis not present

## 2018-03-13 DIAGNOSIS — G629 Polyneuropathy, unspecified: Secondary | ICD-10-CM | POA: Diagnosis not present

## 2018-03-13 DIAGNOSIS — M05731 Rheumatoid arthritis with rheumatoid factor of right wrist without organ or systems involvement: Secondary | ICD-10-CM | POA: Diagnosis not present

## 2018-03-20 DIAGNOSIS — K2950 Unspecified chronic gastritis without bleeding: Secondary | ICD-10-CM | POA: Diagnosis not present

## 2018-03-20 DIAGNOSIS — K760 Fatty (change of) liver, not elsewhere classified: Secondary | ICD-10-CM | POA: Diagnosis not present

## 2018-03-20 DIAGNOSIS — K529 Noninfective gastroenteritis and colitis, unspecified: Secondary | ICD-10-CM | POA: Diagnosis not present

## 2018-03-20 DIAGNOSIS — K219 Gastro-esophageal reflux disease without esophagitis: Secondary | ICD-10-CM | POA: Diagnosis not present

## 2018-03-22 ENCOUNTER — Telehealth: Payer: Self-pay | Admitting: Internal Medicine

## 2018-03-22 DIAGNOSIS — I10 Essential (primary) hypertension: Secondary | ICD-10-CM

## 2018-03-22 NOTE — Telephone Encounter (Signed)
Refill of KLOR CON Sprinkle     By historical provider  LOV 01/19/18 Dr. Nicki Reaper  [facility] 12/23/16  #360  3 refills   [facility] [place], [place] AT [address] (Phone) [phone] (Fax)

## 2018-03-22 NOTE — Telephone Encounter (Signed)
rx request 

## 2018-03-22 NOTE — Telephone Encounter (Signed)
Copied from [place] [phone]. Topic: Quick Communication - Rx Refill/Question >> Mar 22, 2018  2:26 PM Reyne Dumas L wrote: Medication: potassium chloride (KLOR-CON SPRINKLE) 10 MEQ CR capsule  Has the patient contacted their pharmacy? Yes - states they haven't heard from [place] (Agent: If no, request that the patient contact the pharmacy for the refill.) (Agent: If yes, when and what did the pharmacy advise?)  Preferred Pharmacy (with phone number or street name): [place], [place] - [place] AT [facility] [phone] (Phone) [phone] (Fax)  Agent: Please be advised that RX refills may take up to 3 business days. We ask that you follow-up with your pharmacy.

## 2018-03-23 MED ORDER — POTASSIUM CHLORIDE ER 10 MEQ PO CPCR: ORAL_CAPSULE | ORAL | 1 refills | Status: DC

## 2018-03-23 NOTE — Telephone Encounter (Signed)
Reviewed labs.  Potassium wnl.  Per note, has been taking regularly.  rx sent in to [facility] for potassium sprinkles.  Confirm with pt she is still taking lasix and taking 2/day potassium.  Let her know I sent in rx.  If not taking lasix, let me know.

## 2018-03-23 NOTE — Telephone Encounter (Signed)
Verified with pharmacist that this is the rx that she has been taking. There are no more refills on her rx

## 2018-03-23 NOTE — Telephone Encounter (Signed)
Need to clarify prescription with pharmacy.  Per our record appears to be on different potassium previously.  Need to clarify with pharmacy when refilled last and if has been taking regularly and form taking.

## 2018-03-23 NOTE — Telephone Encounter (Signed)
Patient is scheduled for potassium recheck next Thursday per our discussion and Is only taking lasix about once a week

## 2018-03-23 NOTE — Telephone Encounter (Signed)
Called patient. Stated she has been taking regularly and that the bottle has you as the prescriber but I am showing that Dr. Benjie Karvonen refilled last. OK to fill?

## 2018-03-26 NOTE — Telephone Encounter (Signed)
Noted  

## 2018-03-26 NOTE — Telephone Encounter (Signed)
Scheduled patient for an appt with you on 9/19 at 10:30. While we were on the phone she was complaining of a knot on her right leg at the top of her thigh. She denies any warmth but says that there is a red place that itches and a little swollen. Has not been bit by anything. Denies any other symptoms. She was supposed to come in that day for labs but would like to see you so you can look at her leg.

## 2018-03-26 NOTE — Telephone Encounter (Signed)
Patient is currently taking potassium bid. She says that is how she is always taken it. Would you like her to decrease to q day or qod?

## 2018-03-26 NOTE — Telephone Encounter (Signed)
Lab ordered.  If only taking lasix once a week and per note is taking (has has been taking) potassium q day, then have her decrease potassium to qod.  Will recheck lab as planned.

## 2018-03-26 NOTE — Telephone Encounter (Signed)
Decrease to q day and keep f/u lab appt.

## 2018-03-29 ENCOUNTER — Other Ambulatory Visit: Payer: Medicare Other

## 2018-03-29 ENCOUNTER — Ambulatory Visit (INDEPENDENT_AMBULATORY_CARE_PROVIDER_SITE_OTHER): Payer: Medicare Other | Admitting: Internal Medicine

## 2018-03-29 VITALS — BP 140/80 | HR 70 | Temp 98.3°F | Resp 18 | Wt 146.4 lb

## 2018-03-29 DIAGNOSIS — R197 Diarrhea, unspecified: Secondary | ICD-10-CM | POA: Diagnosis not present

## 2018-03-29 DIAGNOSIS — G4733 Obstructive sleep apnea (adult) (pediatric): Secondary | ICD-10-CM

## 2018-03-29 DIAGNOSIS — M069 Rheumatoid arthritis, unspecified: Secondary | ICD-10-CM

## 2018-03-29 DIAGNOSIS — R42 Dizziness and giddiness: Secondary | ICD-10-CM

## 2018-03-29 DIAGNOSIS — I10 Essential (primary) hypertension: Secondary | ICD-10-CM

## 2018-03-29 DIAGNOSIS — R1909 Other intra-abdominal and pelvic swelling, mass and lump: Secondary | ICD-10-CM

## 2018-03-29 DIAGNOSIS — G479 Sleep disorder, unspecified: Secondary | ICD-10-CM | POA: Diagnosis not present

## 2018-03-29 DIAGNOSIS — K219 Gastro-esophageal reflux disease without esophagitis: Secondary | ICD-10-CM | POA: Diagnosis not present

## 2018-03-29 DIAGNOSIS — I4891 Unspecified atrial fibrillation: Secondary | ICD-10-CM | POA: Diagnosis not present

## 2018-03-29 DIAGNOSIS — Z23 Encounter for immunization: Secondary | ICD-10-CM

## 2018-03-29 DIAGNOSIS — E7800 Pure hypercholesterolemia, unspecified: Secondary | ICD-10-CM | POA: Diagnosis not present

## 2018-03-29 DIAGNOSIS — F419 Anxiety disorder, unspecified: Secondary | ICD-10-CM | POA: Diagnosis not present

## 2018-03-29 DIAGNOSIS — D649 Anemia, unspecified: Secondary | ICD-10-CM | POA: Diagnosis not present

## 2018-03-29 DIAGNOSIS — K760 Fatty (change of) liver, not elsewhere classified: Secondary | ICD-10-CM | POA: Diagnosis not present

## 2018-03-29 LAB — BASIC METABOLIC PANEL
BUN: 12 mg/dL (ref 6–23)
CALCIUM: 9.4 mg/dL (ref 8.4–10.5)
CO2: 28 meq/L (ref 19–32)
Chloride: 99 mEq/L (ref 96–112)
Creatinine, Ser: 1.09 mg/dL (ref 0.40–1.20)
GFR: 52.02 mL/min — ABNORMAL LOW (ref >60.00)
GLUCOSE: 102 mg/dL — AB (ref 70–99)
Potassium: 4.4 mEq/L (ref 3.5–5.1)
Sodium: 135 mEq/L (ref 135–145)

## 2018-03-29 LAB — HEPATIC FUNCTION PANEL
ALK PHOS: 36 U/L — AB (ref 39–117)
ALT: 32 U/L (ref 0–35)
AST: 45 U/L — AB (ref 0–37)
Albumin: 4 g/dL (ref 3.5–5.2)
BILIRUBIN DIRECT: 0.1 mg/dL (ref 0.0–0.3)
Total Bilirubin: 0.5 mg/dL (ref 0.2–1.2)
Total Protein: 7 g/dL (ref 6.0–8.3)

## 2018-03-29 MED ORDER — BUSPIRONE HCL 5 MG PO TABS: 5.0000 mg | ORAL_TABLET | Freq: Every evening | ORAL | 1 refills | Status: DC | PRN

## 2018-03-29 NOTE — Progress Notes (Signed)
Patient ID: Kaitlyn Huff, female   DOB: 1941-05-31, 77 y.o.   MRN: [id number]   Subjective:    Patient ID: Kaitlyn Huff, female    DOB: 1941-03-12, 77 y.o.   MRN: [id number]  HPI  Patient here as a work in to discuss a knot on her leg.  She has recently been evaluated by GI for chronic diarrhea.  Diarrhea well managed now on colestipol.  Also taking a probiotic.  Acid reflux is controlled as well.  Continues on PPI.  Is being followed by GI for fatty liver.  Overall they felt she was stable and recommended f/u in 09/2018.  States she is eating.  Weight has decreased.  She is also followed by rheumatology.  Last evaluated 03/13/18.  Received remicade infusion and remains on plaquenil.  Overall stable.  She also reports that she is now off amiodarone.  When on the medication, she felt more fatigued, dizzy and just did not feel well.  Has been off now for approximately 7 weeks.  Feeling better.  Not sleeping well.  Feels anxious at times.  No chest pain.  No sob.  No abdominal pain.  She does report noticing increased swelling and what felt "like a ball" in her right groin.  Swelled.  Has improved now, but still present.  No significant tenderness.  No fever.     Past Medical History:  Diagnosis Date  . Anemia   . Cancer ([place])   . Diverticulitis   . GERD (gastroesophageal reflux disease)   . Hyperlipidemia   . Hypertension   . Osteoarthritis   . Osteoporosis    actonel  . Positive PPD    s/p INH (2006)  . Rheumatoid arthritis(714.0)    MTX transaminitis, Leflunomide (rash), enbrel, plaquinil, prednisone, remicade, Imuran (transaminitis)  . 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 ABDOMINAL HYSTERECTOMY    . CERVICAL CONE BIOPSY    . CHOLECYSTECTOMY  06/22/14  . COLONOSCOPY WITH PROPOFOL N/A 07/09/2015   Procedure: COLONOSCOPY WITH PROPOFOL;  Surgeon: Manya Silvas, MD;  Location: [facility] ENDOSCOPY;  Service: Endoscopy;   Laterality: N/A;  . ESOPHAGOGASTRODUODENOSCOPY (EGD) WITH PROPOFOL N/A 07/09/2015   Procedure: ESOPHAGOGASTRODUODENOSCOPY (EGD) WITH PROPOFOL;  Surgeon: Manya Silvas, MD;  Location: [facility] ENDOSCOPY;  Service: Endoscopy;  Laterality: N/A;  . OVARY SURGERY    . TRACHEOSTOMY  1959  . TUBAL LIGATION     Family History  Problem Relation Age of Onset  . Heart disease Father        MI  . Heart disease Mother   . Valvular heart disease Mother   . Breast cancer Sister 24  . Cancer Sister        Lung cancer  . COPD Sister   . Heart disease Sister   . Colon cancer Neg Hx    Social History   Socioeconomic History  . Marital status: Widowed    Spouse name: Not on file  . Number of children: 4  . Years of education: Not on file  . Highest education level: Not on file  Occupational History  . Occupation: retired  Scientific laboratory technician  . Financial resource strain: Not on file  . Food insecurity:    Worry: Not on file    Inability: Not on file  . Transportation needs:    Medical: Not on file    Non-medical: Not on file  Tobacco Use  .  Smoking status: Never Smoker  . Smokeless tobacco: Never Used  Substance and Sexual Activity  . Alcohol use: No    Alcohol/week: 0.0 standard drinks  . Drug use: No  . Sexual activity: Never  Lifestyle  . Physical activity:    Days per week: Not on file    Minutes per session: Not on file  . Stress: Not on file  Relationships  . Social connections:    Talks on phone: Not on file    Gets together: Not on file    Attends religious service: Not on file    Active member of club or organization: Not on file    Attends meetings of clubs or organizations: Not on file    Relationship status: Not on file  Other Topics Concern  . Not on file  Social History Narrative   Lives with family at home    Outpatient Encounter Medications as of 03/29/2018  Medication Sig  . acetaminophen (TYLENOL) 325 MG tablet Take 2 tablets (650 mg total) by mouth every 6  (six) hours as needed for mild pain (or Fever >/= 101).  Marland Kitchen acidophilus (RISAQUAD) CAPS capsule Take 1 capsule by mouth daily.  Marland Kitchen albuterol (PROAIR HFA) 108 (90 Base) MCG/ACT inhaler USE 2 INHALATIONS EVERY 6 HOURS AS NEEDED FOR WHEEZING OR SHORTNESS OF BREATH  . alendronate (FOSAMAX) 70 MG tablet Take 70 mg by mouth once a week.   . ALPRAZolam (XANAX) 0.25 MG tablet Take 1 tablet (0.25 mg total) by mouth daily as needed for anxiety.  Marland Kitchen aspirin EC 81 MG tablet Take 81 mg by mouth daily.  . busPIRone (BUSPAR) 5 MG tablet Take 1 tablet (5 mg total) by mouth at bedtime as needed.  . Calcium Carbonate-Vitamin D (CALCIUM 600+D) 600-400 MG-UNIT tablet Take 1 tablet by mouth 2 (two) times daily.   . cetirizine (ZYRTEC) 10 MG tablet Take 1 tablet (10 mg total) by mouth daily.  . cholecalciferol (VITAMIN D) 1000 units tablet Take 1,000 Units by mouth daily.  . colestipol (COLESTID) 1 g tablet Take 2 g by mouth daily.   Marland Kitchen esomeprazole (NEXIUM) 20 MG capsule Take 20 mg by mouth daily at 12 noon.  . Fe Fum-FePoly-Vit C-Vit B3 (INTEGRA) 62.5-62.5-40-3 MG CAPS One tablet per day  . FeFum-FePo-FA-B Cmp-C-Zn-Mn-Cu (TANDEM PLUS) 162-115.2-1 MG CAPS TAKE 1 CAPSULE DAILY  . fluticasone (FLONASE) 50 MCG/ACT nasal spray Place 2 sprays into both nostrils daily. (Patient taking differently: Place 2 sprays into both nostrils 2 (two) times daily. )  . guaiFENesin-dextromethorphan (ROBITUSSIN DM) 100-10 MG/5ML syrup Take 5 mLs by mouth every 4 (four) hours as needed for cough.  . hydroxychloroquine (PLAQUENIL) 200 MG tablet Take 200 mg by mouth 2 (two) times daily.   . hydrOXYzine (ATARAX/VISTARIL) 25 MG tablet Take 1 tablet (25 mg total) by mouth 3 (three) times daily as needed. If you take at night do not take with xanax  . lip balm (BLISTEX) OINT Apply 1 application topically as needed for lip care.  . meclizine (ANTIVERT) 25 MG tablet take 1/2 to 1 tablet every 8 hours if needed for VERTIGO  . Multiple Vitamin  (MULTIVITAMIN WITH MINERALS) TABS tablet Take 1 tablet by mouth daily.  . ondansetron (ZOFRAN ODT) 4 MG disintegrating tablet Take 1 tablet (4 mg total) by mouth every 8 (eight) hours as needed for nausea or vomiting.  . potassium chloride (KLOR-CON SPRINKLE) 10 MEQ CR capsule TAKE 2 CAPSULES Once a day  . predniSONE (DELTASONE) 5 MG  tablet Take 1 tablet (5 mg total) by mouth daily with breakfast.  . simvastatin (ZOCOR) 20 MG tablet TAKE 1 TABLET DAILY  . sodium chloride (OCEAN) 0.65 % nasal spray Place 1 spray into the nose as needed for congestion.  . traMADol (ULTRAM) 50 MG tablet Take 50 mg by mouth 2 (two) times daily as needed for moderate pain.  Marland Kitchen triamcinolone cream (KENALOG) 0.1 % Apply 1 application topically 2 (two) times daily. Prn  . [DISCONTINUED] amiodarone (PACERONE) 200 MG tablet Take 1 tablet by mouth daily.    No facility-administered encounter medications on file as of 03/29/2018.     Review of Systems  Constitutional: Positive for fatigue. Negative for appetite change.       Some weight loss.    HENT: Negative for congestion and sinus pressure.   Respiratory: Negative for cough, chest tightness and shortness of breath.   Cardiovascular: Negative for chest pain, palpitations and leg swelling.  Gastrointestinal: Negative for abdominal pain, diarrhea, nausea and vomiting.       Bowels doing better on colestid.    Genitourinary: Negative for difficulty urinating and dysuria.  Musculoskeletal: Negative for joint swelling and myalgias.  Skin: Negative for color change and rash.  Neurological: Positive for dizziness. Negative for headaches.  Psychiatric/Behavioral: Negative for dysphoric mood.       Not sleeping well.  Some anxiety.         Objective:    Physical Exam  Constitutional: She appears well-developed and well-nourished. No distress.  HENT:  Nose: Nose normal.  Mouth/Throat: Oropharynx is clear and moist.  Neck: Neck supple. No thyromegaly present.    Cardiovascular: Normal rate and regular rhythm.  Pulmonary/Chest: Breath sounds normal. No respiratory distress. She has no wheezes.  Abdominal: Soft. Bowel sounds are normal. There is no tenderness.  Musculoskeletal: She exhibits no edema or tenderness.  Palpable fullness (firm area) - right groin/upper thigh.    Lymphadenopathy:    She has no cervical adenopathy.  Skin: No rash noted. No erythema.  Psychiatric: She has a normal mood and affect. Her behavior is normal.    BP 140/80 (BP Location: Left Arm, Patient Position: Sitting, Cuff Size: Normal)   Pulse 70   Temp 98.3 F (36.8 C) (Oral)   Resp 18   Wt 146 lb 6.4 oz (66.4 kg)   SpO2 97%   BMI 25.13 kg/m  Wt Readings from Last 3 Encounters:  03/29/18 146 lb 6.4 oz (66.4 kg)  01/29/18 150 lb (68 kg)  01/19/18 152 lb 12.8 oz (69.3 kg)     Lab Results  Component Value Date   WBC 6.7 12/21/2017   HGB 12.5 12/21/2017   HCT 36.6 12/21/2017   PLT 225.0 12/21/2017   GLUCOSE 102 (H) 03/29/2018   CHOL 142 07/14/2017   TRIG 112.0 07/14/2017   HDL 79.10 07/14/2017   LDLDIRECT 73.7 08/20/2013   LDLCALC 40 07/14/2017   ALT 32 03/29/2018   AST 45 (H) 03/29/2018   NA 135 03/29/2018   K 4.4 03/29/2018   CL 99 03/29/2018   CREATININE 1.09 03/29/2018   BUN 12 03/29/2018   CO2 28 03/29/2018   TSH 2.56 10/10/2017   INR 1.28 10/31/2016    Ct Abdomen Pelvis W Contrast  Result Date: 06/24/2017 CLINICAL DATA:  Sudden onset of abdominal pain. EXAM: CT ABDOMEN AND PELVIS WITH CONTRAST TECHNIQUE: Multidetector CT imaging of the abdomen and pelvis was performed using the standard protocol following bolus administration of intravenous contrast. CONTRAST:  162mL ISOVUE-300 IOPAMIDOL (ISOVUE-300) INJECTION 61% COMPARISON:  Abdominal ultrasound 06/12/2017.  CT 06/19/2016 FINDINGS: Lower chest: Mild cardiomegaly. No pleural fluid or consolidation. Moderate hiatal hernia with minimal fluid in the intrathoracic stomach. Hepatobiliary:  Decreased hepatic density consistent with steatosis. Postcholecystectomy with clips in the gallbladder fossa. Stable dilatation of the common bile duct post cholecystectomy measuring 10 mm distally. Pancreas: No ductal dilatation or inflammation. 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 Spleen is normal in size. Adrenals/Urinary Tract: Normal adrenal glands. No hydronephrosis. Homogeneous renal enhancement with symmetric excretion on delayed phase imaging. Symmetric mild bilateral perinephric edema is unchanged from prior exam. Urinary bladder is distended without wall thickening. Stomach/Bowel: Moderate hiatal hernia. Small amount of fluid in the intrathoracic and subdiaphragmatic stomach mild distal gastric wall thickening. No small bowel obstruction or inflammation. Majority of the colon is decompressed. Possible wall thickening involving the descending and proximal sigmoid colon. Descending and sigmoid diverticulosis without acute diverticulitis. Normal appendix. Vascular/Lymphatic: Scattered aortic atherosclerosis. No aneurysm. Prominent right inguinal nodes measuring up to 10 mm, likely reactive. No enlarged abdominal lymph nodes. Reproductive: Status post hysterectomy. No adnexal masses. Other: No free air, free fluid, or intra-abdominal fluid collection. Small fat containing umbilical hernia. Musculoskeletal: Bones are under mineralized with multilevel degenerative change in the spine. IMPRESSION: 1. Equivocal colonic wall thickening of the descending and proximal sigmoid colon versus nondistention. Possible mild colitis. 2. Moderate hiatal hernia with some intraluminal fluid, as well as mild distal gastric wall thickening, nonspecific but can be seen with gastritis. 3. Hepatic steatosis. Chronic biliary prominence postcholecystectomy. Electronically Signed   By: Jeb Levering M.D.   On: 06/24/2017 03:20       Assessment & Plan:   Problem List  Items Addressed This Visit    A-fib [facility])    Was on amiodarone.  Did not tolerate.  Appears to be in SR.  Feels better off amiodarone.  Continue f/u with cardiology.        Anemia    Follow cbc.        Anxiety    Increased anxiety.  Not sleeping.  Some anxiety.  On xanax.  Discussed trying to wean her off xanax.  States she needs to help her relax and sleep.  Will give her a trial of buspar.  Avoid taking together.  Have discussed psychiatry referral.        Relevant Medications   busPIRone (BUSPAR) 5 MG tablet   Diarrhea    Much improved on colestipol.        Difficulty sleeping    Persistent difficulty sleeping and anxiety.  Has xanax.  Have discussed psychiatry referral.  Discussed trying to taper her off xanax.  Discussed a trial of buspar.  Also discussed the need to treat sleep apnea.  She declines.        Dizziness    Better since being off amiodarone.  Discussed slow position changes and movements.  Had relatively recent MRI.  Saw neurology.  Try to get her sleeping better.  Taper xanax.        Fatty liver    Being followed by GI.  Follow liver function tests.        Relevant Orders   Hepatic function panel (Completed)   Hepatitis B e antigen (Completed)   Hepatitis B surface antibody,qualitative (Completed)   Hepatitis B surface antigen (Completed)   Hepatitis C antibody (Completed)   Hepatitis B core antibody, IgM (Completed)   Hepatitis B core antibody,  total (Completed)   GERD (gastroesophageal reflux disease)    Controlled on nexium.        Hypercholesterolemia    On simvastatin.  Low cholesterol diet and exercise.  Follow lipid panel and liver function tests.        Hypertension - Primary    Blood pressure as outlined.  Follow.        Relevant Orders   Basic metabolic panel (Completed)   Nodule of groin    Palpable fullness/firm area - right upper thigh/groin.  Persistent.  Has decreased in size.  Will obtain ultrasound.  Further w/up pending  results.        Obstructive sleep apnea    Has known sleep apnea.  Discussed with her today regarding the need and importance of treating.  She declines.  Try to avoid sedating medication.  Treat anxiety with buspar.  Follow.        Rheumatoid arthritis ([place])    Followed by Dr [name].         Other Visit Diagnoses    Need for influenza vaccination       Relevant Orders   Flu vaccine HIGH DOSE PF (Fluzone High dose) (Completed)       [name], MD

## 2018-03-30 LAB — HEPATITIS C ANTIBODY
Hepatitis C Ab: NONREACTIVE
SIGNAL TO CUT-OFF: 0.09 (ref <1.00)

## 2018-03-30 LAB — HEPATITIS B CORE ANTIBODY, TOTAL: Hep B Core Total Ab: NONREACTIVE

## 2018-03-30 LAB — HEPATITIS B SURFACE ANTIBODY,QUALITATIVE: HEP B S AB: NONREACTIVE

## 2018-03-30 LAB — HEPATITIS B SURFACE ANTIGEN: Hepatitis B Surface Ag: NONREACTIVE

## 2018-03-30 LAB — HEPATITIS B CORE ANTIBODY, IGM: HEP B C IGM: NONREACTIVE

## 2018-03-30 LAB — HEPATITIS B E ANTIGEN: Hep B E Ag: NONREACTIVE

## 2018-04-01 ENCOUNTER — Encounter: Payer: Self-pay | Admitting: Internal Medicine

## 2018-04-01 DIAGNOSIS — R1909 Other intra-abdominal and pelvic swelling, mass and lump: Secondary | ICD-10-CM | POA: Insufficient documentation

## 2018-04-01 NOTE — Assessment & Plan Note (Addendum)
Being followed by GI.  Follow liver function tests.  Check hepatitis panel.

## 2018-04-01 NOTE — Assessment & Plan Note (Signed)
Better since being off amiodarone.  Discussed slow position changes and movements.  Had relatively recent MRI.  Saw neurology.  Try to get her sleeping better.  Taper xanax.

## 2018-04-01 NOTE — Assessment & Plan Note (Signed)
Much improved on colestipol.   

## 2018-04-01 NOTE — Assessment & Plan Note (Signed)
Palpable fullness/firm area - right upper thigh/groin.  Persistent.  Has decreased in size.  Will obtain ultrasound.  Further w/up pending results.

## 2018-04-01 NOTE — Assessment & Plan Note (Signed)
Has known sleep apnea.  Discussed with her today regarding the need and importance of treating.  She declines.  Try to avoid sedating medication.  Treat anxiety with buspar.  Follow.

## 2018-04-01 NOTE — Assessment & Plan Note (Signed)
Controlled on nexium.  

## 2018-04-01 NOTE — Assessment & Plan Note (Signed)
Increased anxiety.  Not sleeping.  Some anxiety.  On xanax.  Discussed trying to wean her off xanax.  States she needs to help her relax and sleep.  Will give her a trial of buspar.  Avoid taking together.  Have discussed psychiatry referral.

## 2018-04-01 NOTE — Assessment & Plan Note (Signed)
Was on amiodarone.  Did not tolerate.  Appears to be in SR.  Feels better off amiodarone.  Continue f/u with cardiology.

## 2018-04-01 NOTE — Assessment & Plan Note (Signed)
Blood pressure as outlined.  Follow.   

## 2018-04-01 NOTE — Assessment & Plan Note (Signed)
Followed by Dr Kernodle.   

## 2018-04-01 NOTE — Assessment & Plan Note (Signed)
Persistent difficulty sleeping and anxiety.  Has xanax.  Have discussed psychiatry referral.  Discussed trying to taper her off xanax.  Discussed a trial of buspar.  Also discussed the need to treat sleep apnea.  She declines.

## 2018-04-01 NOTE — Assessment & Plan Note (Signed)
On simvastatin.  Low cholesterol diet and exercise.  Follow lipid panel and liver function tests.   

## 2018-04-01 NOTE — Assessment & Plan Note (Signed)
Follow cbc.  

## 2018-04-02 ENCOUNTER — Ambulatory Visit (INDEPENDENT_AMBULATORY_CARE_PROVIDER_SITE_OTHER): Payer: Medicare Other | Admitting: Vascular Surgery

## 2018-04-02 ENCOUNTER — Ambulatory Visit (INDEPENDENT_AMBULATORY_CARE_PROVIDER_SITE_OTHER): Payer: Medicare Other

## 2018-04-02 ENCOUNTER — Other Ambulatory Visit (INDEPENDENT_AMBULATORY_CARE_PROVIDER_SITE_OTHER): Payer: Self-pay | Admitting: Vascular Surgery

## 2018-04-02 ENCOUNTER — Encounter (INDEPENDENT_AMBULATORY_CARE_PROVIDER_SITE_OTHER): Payer: Self-pay | Admitting: Vascular Surgery

## 2018-04-02 VITALS — BP 158/77 | HR 65 | Ht 65.0 in | Wt 145.0 lb

## 2018-04-02 DIAGNOSIS — I872 Venous insufficiency (chronic) (peripheral): Secondary | ICD-10-CM | POA: Diagnosis not present

## 2018-04-02 DIAGNOSIS — Z79899 Other long term (current) drug therapy: Secondary | ICD-10-CM | POA: Diagnosis not present

## 2018-04-02 DIAGNOSIS — M79605 Pain in left leg: Secondary | ICD-10-CM | POA: Diagnosis not present

## 2018-04-02 DIAGNOSIS — E7800 Pure hypercholesterolemia, unspecified: Secondary | ICD-10-CM | POA: Diagnosis not present

## 2018-04-02 DIAGNOSIS — M47817 Spondylosis without myelopathy or radiculopathy, lumbosacral region: Secondary | ICD-10-CM | POA: Insufficient documentation

## 2018-04-02 DIAGNOSIS — I4891 Unspecified atrial fibrillation: Secondary | ICD-10-CM | POA: Diagnosis not present

## 2018-04-02 DIAGNOSIS — I10 Essential (primary) hypertension: Secondary | ICD-10-CM | POA: Diagnosis not present

## 2018-04-02 DIAGNOSIS — M79604 Pain in right leg: Secondary | ICD-10-CM | POA: Diagnosis not present

## 2018-04-02 DIAGNOSIS — M5137 Other intervertebral disc degeneration, lumbosacral region: Secondary | ICD-10-CM

## 2018-04-02 DIAGNOSIS — I8312 Varicose veins of left lower extremity with inflammation: Secondary | ICD-10-CM

## 2018-04-02 DIAGNOSIS — I8311 Varicose veins of right lower extremity with inflammation: Secondary | ICD-10-CM | POA: Diagnosis not present

## 2018-04-02 DIAGNOSIS — I83819 Varicose veins of unspecified lower extremities with pain: Secondary | ICD-10-CM

## 2018-04-02 NOTE — Progress Notes (Signed)
MRN : [id number]  Kaitlyn Huff is a 77 y.o. (07/27/1940) female who presents with chief complaint of No chief complaint on file. Marland Kitchen  History of Present Illness: The patient returns for followup evaluation after the initial visit. The patient continues to have pain in the lower extremities with dependency. The pain is lessened with elevation. Graduated compression stockings, Class I (20-30 mmHg), have been worn but the stockings do not eliminate the leg pain.   The pain is somewhat consistent day to day occurring on most days. The patient notes the pain also occurs with standing and routinely seems worse as the day wears on. The pain has been progressive over the past several years. The patient states these symptoms are causing  a profound negative impact on quality of life and daily activities.  There is a  history of back problems and DJD of the lumbar and sacral spine.   Venous ultrasound shows normal deep venous system, no evidence of acute or chronic DVT.  Superficial reflux is not present.  No outpatient medications have been marked as taking for the 04/02/18 encounter (Appointment) with Delana Meyer, Dolores Lory, MD.    Past Medical History:  Diagnosis Date  . Anemia   . Cancer ([place])   . Diverticulitis   . GERD (gastroesophageal reflux disease)   . Hyperlipidemia   . Hypertension   . Osteoarthritis   . Osteoporosis    actonel  . Positive PPD    s/p INH (2006)  . Rheumatoid arthritis(714.0)    MTX transaminitis, Leflunomide (rash), enbrel, plaquinil, prednisone, remicade, Imuran (transaminitis)  . Valvular heart disease    moderate MR and TR    Past Surgical History:  Procedure Laterality Date  . ABDOMINAL HYSTERECTOMY    . CERVICAL CONE BIOPSY    . CHOLECYSTECTOMY  06/22/14  . COLONOSCOPY WITH PROPOFOL N/A 07/09/2015   Procedure: COLONOSCOPY WITH PROPOFOL;  Surgeon: Manya Silvas, MD;  Location: [facility] ENDOSCOPY;  Service: Endoscopy;  Laterality: N/A;  .  ESOPHAGOGASTRODUODENOSCOPY (EGD) WITH PROPOFOL N/A 07/09/2015   Procedure: ESOPHAGOGASTRODUODENOSCOPY (EGD) WITH PROPOFOL;  Surgeon: Manya Silvas, MD;  Location: [facility] ENDOSCOPY;  Service: Endoscopy;  Laterality: N/A;  . OVARY SURGERY    . TRACHEOSTOMY  1959  . TUBAL LIGATION      Social History Social History   Tobacco Use  . Smoking status: Never Smoker  . Smokeless tobacco: Never Used  Substance Use Topics  . Alcohol use: No    Alcohol/week: 0.0 standard drinks  . Drug use: No    Family History Family History  Problem Relation Age of Onset  . Heart disease Father        MI  . Heart disease Mother   . Valvular heart disease Mother   . Breast cancer Sister 74  . Cancer Sister        Lung cancer  . COPD Sister   . Heart disease Sister   . Colon cancer Neg Hx     Allergies  Allergen Reactions  . Astelin [Azelastine Hcl] Other (See Comments)    Reaction:  Unknown   . Codeine Other (See Comments)    Reaction:  Altered mental status  . Dilaudid [Hydromorphone] Other (See Comments)    Reaction:  Unknown   . Flexeril [Cyclobenzaprine] Other (See Comments)    Reaction:  Unknown   . Imuran [Azathioprine] Other (See Comments)    Reaction:  Abnormal liver function  . Lisinopril Itching  . Lyrica [Pregabalin] Other (See  Comments)    Reaction:  Sore gums   . Methotrexate Derivatives Other (See Comments)    Reaction:  Abnormal liver function  . Amoxicillin Rash and Other (See Comments)    Unable to obtain enough information to answer additional questions about this medication.    Jolee Ewing [Leflunomide] Rash  . Clindamycin/Lincomycin Rash  . Doxycycline Rash  . Lodine [Etodolac] Rash  . Percocet [Oxycodone-Acetaminophen] Rash  . Sulfa Antibiotics Rash     REVIEW OF SYSTEMS (Negative unless checked)  Constitutional: [] Weight loss  [] Fever  [] Chills Cardiac: [] Chest pain   [] Chest pressure   [] Palpitations   [] Shortness of breath when laying flat   [] Shortness of  breath with exertion. Vascular:  [] Pain in legs with walking   [x] Pain in legs at rest  [] History of DVT   [] Phlebitis   [x] Swelling in legs   [x] Varicose veins   [] Non-healing ulcers Pulmonary:   [] Uses home oxygen   [] Productive cough   [] Hemoptysis   [] Wheeze  [] COPD   [] Asthma Neurologic:  [] Dizziness   [] Seizures   [] History of stroke   [] History of TIA  [] Aphasia   [] Vissual changes   [] Weakness or numbness in arm   [] Weakness or numbness in leg Musculoskeletal:   [x] Joint swelling   [x] Joint pain   [x] Low back pain Hematologic:  [] Easy bruising  [] Easy bleeding   [] Hypercoagulable state   [] Anemic Gastrointestinal:  [] Diarrhea   [] Vomiting  [] Gastroesophageal reflux/heartburn   [] Difficulty swallowing. Genitourinary:  [] Chronic kidney disease   [] Difficult urination  [] Frequent urination   [] Blood in urine Skin:  [] Rashes   [] Ulcers  Psychological:  [] History of anxiety   []  History of major depression.  Physical Examination  There were no vitals filed for this visit. There is no height or weight on file to calculate BMI. Gen: WD/WN, NAD Head: [place]/AT, No temporalis wasting.  Ear/Nose/Throat: Hearing grossly intact, nares w/o erythema or drainage Eyes: PER, EOMI, sclera nonicteric.  Neck: Supple, no large masses.   Pulmonary:  Good air movement, no audible wheezing bilaterally, no use of accessory muscles.  Cardiac: RRR, no JVD Vascular: scattered varicosities present bilaterally.  Mild venous stasis changes to the legs bilaterally.  2+ soft pitting edema Vessel Right Left  Radial Palpable Palpable  PT Palpable Palpable  DP Palpable Palpable  Gastrointestinal: Non-distended. No guarding/no peritoneal signs.  Musculoskeletal: M/S 5/5 throughout.  No deformity or atrophy.  Neurologic: CN 2-12 intact. Symmetrical.  Speech is fluent. Motor exam as listed above. Psychiatric: Judgment intact, Mood & affect appropriate for pt's clinical situation. Dermatologic: venous rashes no ulcers  noted.  No changes consistent with cellulitis. Lymph : No lichenification or skin changes of chronic lymphedema.  CBC Lab Results  Component Value Date   WBC 6.7 12/21/2017   HGB 12.5 12/21/2017   HCT 36.6 12/21/2017   MCV 94.0 12/21/2017   PLT 225.0 12/21/2017    BMET    Component Value Date/Time   NA 135 03/29/2018 1122   NA 140 11/03/2014 0441   K 4.4 03/29/2018 1122   K 3.7 11/03/2014 0441   CL 99 03/29/2018 1122   CL 108 11/03/2014 0441   CO2 28 03/29/2018 1122   CO2 23 11/03/2014 0441   GLUCOSE 102 (H) 03/29/2018 1122   GLUCOSE 133 (H) 11/03/2014 0441   BUN 12 03/29/2018 1122   BUN 16 11/03/2014 0441   CREATININE 1.09 03/29/2018 1122   CREATININE 1.35 (H) 12/18/2015 1603   CALCIUM 9.4 03/29/2018 1122  CALCIUM 7.6 (L) 11/03/2014 0441   GFRNONAA 60 (L) 06/26/2017 0538   GFRNONAA >60 11/03/2014 0441   GFRAA >60 06/26/2017 0538   GFRAA >60 11/03/2014 0441   Estimated Creatinine Clearance: 42.5 mL/min (by C-G formula based on SCr of 1.09 mg/dL).  COAG Lab Results  Component Value Date   INR 1.28 10/31/2016   INR 1.13 10/31/2016   INR 1.05 06/16/2016    Radiology No results found.   Assessment/Plan 1. Pain in both lower extremities Recommend:  I do not find evidence of Vascular pathology that would explain the patient's symptoms  The patient has atypical pain symptoms for vascular disease  Noninvasive studies including venous ultrasound of the legs do not identify vascular problems  The patient should continue walking and begin a more formal exercise program. The patient should continue his antiplatelet therapy and aggressive treatment of the lipid abnormalities. The patient should begin wearing graduated compression socks 15-20 mmHg strength to control her mild edema.  Patient will follow-up with me on a PRN basis  Further work-up of her lower extremity pain is deferred to the primary service     2. Varicose veins of both lower extremities with  inflammation Recommend:  The patient is complaining of varicose veins.    I have had a long discussion with the patient regarding  varicose veins and why they cause symptoms.  Patient will begin wearing graduated compression stockings on a daily basis, beginning first thing in the morning and removing them in the evening. The patient is instructed specifically not to sleep in the stockings.    The patient  will also begin using over-the-counter analgesics such as Motrin 600 mg po TID to help control the symptoms as needed.    In addition, behavioral modification including elevation during the day will be initiated, utilizing a recliner was recommended.  The patient is also instructed to continue exercising such as walking 4-5 times per week.  At this time the patient wishes to continue conservative therapy and is not interested in more invasive treatments such as laser ablation and sclerotherapy.  The Patient will follow up PRN if the symptoms worsen.  3. Chronic venous insufficiency Recommend:  The patient is complaining of varicose veins.    I have had a long discussion with the patient regarding  varicose veins and why they cause symptoms.  Patient will begin wearing graduated compression stockings on a daily basis, beginning first thing in the morning and removing them in the evening. The patient is instructed specifically not to sleep in the stockings.    The patient  will also begin using over-the-counter analgesics such as Motrin 600 mg po TID to help control the symptoms as needed.    In addition, behavioral modification including elevation during the day will be initiated, utilizing a recliner was recommended.  The patient is also instructed to continue exercising such as walking 4-5 times per week.  At this time the patient wishes to continue conservative therapy and is not interested in more invasive treatments such as laser ablation and sclerotherapy.  The Patient will follow  up PRN if the symptoms worsen.  4. DJD (degenerative joint disease), lumbosacral Recommend:  I do not find evidence of Vascular pathology that would explain the patient's symptoms  The patient has atypical pain symptoms for vascular disease  Noninvasive studies including venous ultrasound of the legs do not identify vascular problems  The patient should continue walking and begin a more formal exercise program. The patient  should continue his antiplatelet therapy and aggressive treatment of the lipid abnormalities. The patient should begin wearing graduated compression socks 15-20 mmHg strength to control her mild edema.  Patient will follow-up with me on a PRN basis  Further work-up of her lower extremity pain is deferred to the primary service     5. Atrial fibrillation, unspecified type ([place]) Continue antiarrhythmia medications as already ordered, these medications have been reviewed and there are no changes at this time.  Continue anticoagulation as ordered by Cardiology Service   6. Essential hypertension Continue antihypertensive medications as already ordered, these medications have been reviewed and there are no changes at this time.   7. Hypercholesterolemia Continue statin as ordered and reviewed, no changes at this time     Hortencia Pilar, MD  04/02/2018 4:22 PM

## 2018-04-07 ENCOUNTER — Other Ambulatory Visit: Payer: Self-pay | Admitting: Internal Medicine

## 2018-04-11 DIAGNOSIS — Z961 Presence of intraocular lens: Secondary | ICD-10-CM | POA: Diagnosis not present

## 2018-04-18 ENCOUNTER — Other Ambulatory Visit: Payer: Self-pay | Admitting: Internal Medicine

## 2018-04-18 ENCOUNTER — Telehealth: Payer: Self-pay | Admitting: Internal Medicine

## 2018-04-18 DIAGNOSIS — R2241 Localized swelling, mass and lump, right lower limb: Secondary | ICD-10-CM

## 2018-04-18 NOTE — Progress Notes (Signed)
Order placed for right lower extremity ultrasound.

## 2018-04-18 NOTE — Telephone Encounter (Signed)
-----   Message from Eustace Pen sent at 04/18/2018  2:15 PM EDT ----- Regarding: RE: question about test to order I am SO SORRY! I don't know how I overlooked this. It will be [id number] ultrasound right lower ext. limited soft tissue nonvascular. Thanks Melissa ----- Message ----- From: [name], MD Sent: 04/01/2018   9:09 AM EDT To: Eustace Pen Subject: question about test to order                   This pt has a palpable mass/fullness - right upper thigh/groin area.  Would like to get ultrasound to evaluate.  Do you know how I order this - to get ultrasound of this area.    Thanks for your help.  Dr Nicki Reaper

## 2018-04-18 NOTE — Telephone Encounter (Signed)
Order placed for right lower extremity ultrasound

## 2018-04-23 ENCOUNTER — Telehealth: Payer: Self-pay | Admitting: Internal Medicine

## 2018-04-23 ENCOUNTER — Other Ambulatory Visit: Payer: Self-pay | Admitting: Internal Medicine

## 2018-04-23 DIAGNOSIS — Z1231 Encounter for screening mammogram for malignant neoplasm of breast: Secondary | ICD-10-CM

## 2018-04-23 NOTE — Telephone Encounter (Signed)
Copied from [place] [id number]. Topic: General - Other >> Apr 23, 2018  2:08 PM Carolyn Stare wrote:  Pt said she is still having trouble sleeping and is asking for something, she  said she was given busPIRone (BUSPAR) 5 MG tablet   and this tore her nerves up so she quit taking   Pt also req refill [place]

## 2018-04-24 NOTE — Telephone Encounter (Signed)
If she is continuing to have issues with increased anxiety and sleep, I would recommend appt with Dr Nicolasa Ducking (psychiatry) to help with medication adjustment, etc.  If agreeable, let me know and I will place the order for the referral.

## 2018-04-24 NOTE — Telephone Encounter (Signed)
Per chart review, she got a rx for 90 tablets on 02/15/18.  She should not need a refill.  Also, given need for continued regular xanax (and given is regulated medication), needs to be scheduled to see psychiatry to discuss other treatment options.

## 2018-04-24 NOTE — Telephone Encounter (Signed)
Patient is not taking her buspar. Her last refill of xanax was 02/15/18 #90. Would you like for her to come in for an appt to discuss this or is there something else that can be prescribed to help her sleep?

## 2018-04-24 NOTE — Telephone Encounter (Signed)
Patient declined referral to Dr. Nicolasa Ducking, says that she feels that she is getting it under control. She is taking her xanax daily instead of PRN and would like to get a refill.

## 2018-04-25 NOTE — Telephone Encounter (Signed)
I will refill when she is due, but long term (if she continues to have increased issues), will need to refer for better treatment of her symptoms.

## 2018-04-25 NOTE — Telephone Encounter (Signed)
Patient has declined referral to psychiatry and would like to know if you are going to continue to prescribe her xanax once it is time for her refill. Encouraged referral to psychiatry but patient still declined.

## 2018-04-26 NOTE — Telephone Encounter (Signed)
LMTCB

## 2018-04-26 NOTE — Telephone Encounter (Signed)
Patient is aware per [facility].

## 2018-04-27 ENCOUNTER — Ambulatory Visit: Payer: Medicare Other

## 2018-04-27 ENCOUNTER — Telehealth: Payer: Self-pay | Admitting: Internal Medicine

## 2018-04-27 NOTE — Telephone Encounter (Signed)
Copied from [place] [id number]. Topic: General - Other >> Apr 27, 2018  4:31 PM Vernona Rieger wrote: Reason for CRM: [facility] ultra sound dept called and wanted to let Dr Nicki Reaper know that she did not show up for her scan today.

## 2018-04-27 NOTE — Telephone Encounter (Signed)
FYI pt did not show up for scan

## 2018-04-30 NOTE — Telephone Encounter (Signed)
Patient says she did not mean to miss appt. She thought it was a different day. I have given her the number to call and reschedule

## 2018-04-30 NOTE — Telephone Encounter (Signed)
Need to contact pt and find out why she missed ultrasound appt for the palpable area upper leg/thigh.

## 2018-05-08 DIAGNOSIS — M0579 Rheumatoid arthritis with rheumatoid factor of multiple sites without organ or systems involvement: Secondary | ICD-10-CM | POA: Diagnosis not present

## 2018-05-08 DIAGNOSIS — M05732 Rheumatoid arthritis with rheumatoid factor of left wrist without organ or systems involvement: Secondary | ICD-10-CM | POA: Diagnosis not present

## 2018-05-08 DIAGNOSIS — M05731 Rheumatoid arthritis with rheumatoid factor of right wrist without organ or systems involvement: Secondary | ICD-10-CM | POA: Diagnosis not present

## 2018-05-17 ENCOUNTER — Encounter

## 2018-05-17 ENCOUNTER — Ambulatory Visit (INDEPENDENT_AMBULATORY_CARE_PROVIDER_SITE_OTHER): Payer: Medicare Other | Admitting: Vascular Surgery

## 2018-05-17 ENCOUNTER — Encounter (INDEPENDENT_AMBULATORY_CARE_PROVIDER_SITE_OTHER): Payer: Medicare Other

## 2018-05-21 ENCOUNTER — Encounter: Payer: Self-pay | Admitting: Internal Medicine

## 2018-05-21 ENCOUNTER — Ambulatory Visit (INDEPENDENT_AMBULATORY_CARE_PROVIDER_SITE_OTHER): Payer: Medicare Other | Admitting: Internal Medicine

## 2018-05-21 VITALS — BP 130/62 | HR 72 | Temp 98.1°F | Resp 18 | Wt 140.2 lb

## 2018-05-21 DIAGNOSIS — R634 Abnormal weight loss: Secondary | ICD-10-CM

## 2018-05-21 DIAGNOSIS — Z23 Encounter for immunization: Secondary | ICD-10-CM | POA: Diagnosis not present

## 2018-05-21 DIAGNOSIS — I10 Essential (primary) hypertension: Secondary | ICD-10-CM | POA: Diagnosis not present

## 2018-05-21 DIAGNOSIS — I4891 Unspecified atrial fibrillation: Secondary | ICD-10-CM | POA: Diagnosis not present

## 2018-05-21 DIAGNOSIS — K760 Fatty (change of) liver, not elsewhere classified: Secondary | ICD-10-CM | POA: Diagnosis not present

## 2018-05-21 DIAGNOSIS — D649 Anemia, unspecified: Secondary | ICD-10-CM | POA: Diagnosis not present

## 2018-05-21 DIAGNOSIS — G4733 Obstructive sleep apnea (adult) (pediatric): Secondary | ICD-10-CM | POA: Diagnosis not present

## 2018-05-21 DIAGNOSIS — F419 Anxiety disorder, unspecified: Secondary | ICD-10-CM | POA: Diagnosis not present

## 2018-05-21 DIAGNOSIS — R197 Diarrhea, unspecified: Secondary | ICD-10-CM

## 2018-05-21 DIAGNOSIS — M069 Rheumatoid arthritis, unspecified: Secondary | ICD-10-CM | POA: Diagnosis not present

## 2018-05-21 DIAGNOSIS — E7800 Pure hypercholesterolemia, unspecified: Secondary | ICD-10-CM

## 2018-05-21 MED ORDER — ALPRAZOLAM 0.25 MG PO TABS: 0.2500 mg | ORAL_TABLET | Freq: Every day | ORAL | 0 refills | Status: DC | PRN

## 2018-05-21 MED ORDER — TANDEM PLUS 162-115.2-1 MG PO CAPS: 1.0000 | ORAL_CAPSULE | Freq: Every day | ORAL | 1 refills | Status: DC

## 2018-05-21 MED ORDER — FLUTICASONE PROPIONATE 50 MCG/ACT NA SUSP: 2.0000 | Freq: Every day | NASAL | 6 refills | Status: DC

## 2018-05-21 NOTE — Progress Notes (Signed)
Patient ID: Kaitlyn Huff, female   DOB: 1940/12/12, 77 y.o.   MRN: [id number]   Subjective:    Patient ID: Kaitlyn Huff, female    DOB: [birth year], 77 y.o.   MRN: [id number]  HPI  Patient here for a scheduled follow up.  She reports feeling fatigued.  States notices as day progresses.  Does not eat regular meals.  Is eating, but amount varies.  No vomiting.  No diarrhea.  Decreased weight.  Take lasix qod.  Takes potassium q day.  Overall she feels she is doing better.  Handling stress better.  Does still take xanax, but is trying to cut back.  No chest pain.  Breathing stable. No increased cough or congestion.     Past Medical History:  Diagnosis Date  . Anemia   . Cancer ([place])   . Diverticulitis   . GERD (gastroesophageal reflux disease)   . Hyperlipidemia   . Hypertension   . Osteoarthritis   . Osteoporosis    actonel  . Positive PPD    s/p INH (2006)  . Rheumatoid arthritis(714.0)    MTX transaminitis, Leflunomide (rash), enbrel, plaquinil, prednisone, remicade, Imuran (transaminitis)  . 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 ABDOMINAL HYSTERECTOMY    . CERVICAL CONE BIOPSY    . CHOLECYSTECTOMY  06/22/14  . COLONOSCOPY WITH PROPOFOL N/A 07/09/2015   Procedure: COLONOSCOPY WITH PROPOFOL;  Surgeon: Manya Silvas, MD;  Location: [facility] ENDOSCOPY;  Service: Endoscopy;  Laterality: N/A;  . ESOPHAGOGASTRODUODENOSCOPY (EGD) WITH PROPOFOL N/A 07/09/2015   Procedure: ESOPHAGOGASTRODUODENOSCOPY (EGD) WITH PROPOFOL;  Surgeon: Manya Silvas, MD;  Location: [facility] ENDOSCOPY;  Service: Endoscopy;  Laterality: N/A;  . OVARY SURGERY    . TRACHEOSTOMY  1959  . TUBAL LIGATION     Family History  Problem Relation Age of Onset  . Heart disease Father        MI  . Heart disease Mother   . Valvular heart disease Mother   . Breast cancer Sister 41  . Cancer Sister        Lung cancer  . COPD Sister   . Heart disease  Sister   . Colon cancer Neg Hx    Social History   Socioeconomic History  . Marital status: Widowed    Spouse name: Not on file  . Number of children: 4  . Years of education: Not on file  . Highest education level: Not on file  Occupational History  . Occupation: retired  Scientific laboratory technician  . Financial resource strain: Not on file  . Food insecurity:    Worry: Not on file    Inability: Not on file  . Transportation needs:    Medical: Not on file    Non-medical: Not on file  Tobacco Use  . Smoking status: Never Smoker  . Smokeless tobacco: Never Used  Substance and Sexual Activity  . Alcohol use: No    Alcohol/week: 0.0 standard drinks  . Drug use: No  . Sexual activity: Never  Lifestyle  . Physical activity:    Days per week: Not on file    Minutes per session: Not on file  . Stress: Not on file  Relationships  . Social connections:    Talks on phone: Not on file    Gets together: Not on file    Attends religious service: Not on file    Active member of club or  organization: Not on file    Attends meetings of clubs or organizations: Not on file    Relationship status: Not on file  Other Topics Concern  . Not on file  Social History Narrative   Lives with family at home    Outpatient Encounter Medications as of 05/21/2018  Medication Sig  . acetaminophen (TYLENOL) 325 MG tablet Take 2 tablets (650 mg total) by mouth every 6 (six) hours as needed for mild pain (or Fever >/= 101). (Patient not taking: Reported on 04/02/2018)  . acidophilus (RISAQUAD) CAPS capsule Take 1 capsule by mouth daily.  Marland Kitchen albuterol (PROAIR HFA) 108 (90 Base) MCG/ACT inhaler USE 2 INHALATIONS EVERY 6 HOURS AS NEEDED FOR WHEEZING OR SHORTNESS OF BREATH  . alendronate (FOSAMAX) 70 MG tablet Take 70 mg by mouth once a week.   . ALPRAZolam (XANAX) 0.25 MG tablet Take 1 tablet (0.25 mg total) by mouth daily as needed for anxiety.  Marland Kitchen aspirin EC 81 MG tablet Take 81 mg by mouth daily.  . busPIRone  (BUSPAR) 5 MG tablet Take 1 tablet (5 mg total) by mouth at bedtime as needed.  . Calcium Carbonate-Vitamin D (CALCIUM 600+D) 600-400 MG-UNIT tablet Take 1 tablet by mouth 2 (two) times daily.   . cetirizine (ZYRTEC) 10 MG tablet TAKE 1 TABLET DAILY  . cholecalciferol (VITAMIN D) 1000 units tablet Take 1,000 Units by mouth daily.  . colestipol (COLESTID) 1 g tablet Take 2 g by mouth daily.   Marland Kitchen esomeprazole (NEXIUM) 20 MG capsule Take 20 mg by mouth daily at 12 noon.  Marland Kitchen FeFum-FePo-FA-B Cmp-C-Zn-Mn-Cu (TANDEM PLUS) 162-115.2-1 MG CAPS Take 1 capsule by mouth daily.  . fluticasone (FLONASE) 50 MCG/ACT nasal spray Place 2 sprays into both nostrils daily.  Marland Kitchen guaiFENesin-dextromethorphan (ROBITUSSIN DM) 100-10 MG/5ML syrup Take 5 mLs by mouth every 4 (four) hours as needed for cough.  . hydroxychloroquine (PLAQUENIL) 200 MG tablet Take 200 mg by mouth 2 (two) times daily.   . hydrOXYzine (ATARAX/VISTARIL) 25 MG tablet Take 1 tablet (25 mg total) by mouth 3 (three) times daily as needed. If you take at night do not take with xanax  . lip balm (BLISTEX) OINT Apply 1 application topically as needed for lip care.  . meclizine (ANTIVERT) 25 MG tablet take 1/2 to 1 tablet every 8 hours if needed for VERTIGO  . Multiple Vitamin (MULTIVITAMIN WITH MINERALS) TABS tablet Take 1 tablet by mouth daily.  . ondansetron (ZOFRAN ODT) 4 MG disintegrating tablet Take 1 tablet (4 mg total) by mouth every 8 (eight) hours as needed for nausea or vomiting.  . potassium chloride (KLOR-CON SPRINKLE) 10 MEQ CR capsule TAKE 2 CAPSULES Once a day  . predniSONE (DELTASONE) 5 MG tablet Take 1 tablet (5 mg total) by mouth daily with breakfast.  . simvastatin (ZOCOR) 20 MG tablet TAKE 1 TABLET DAILY  . sodium chloride (OCEAN) 0.65 % nasal spray Place 1 spray into the nose as needed for congestion.  . traMADol (ULTRAM) 50 MG tablet Take 50 mg by mouth 2 (two) times daily as needed for moderate pain.  Marland Kitchen triamcinolone cream (KENALOG)  0.1 % Apply 1 application topically 2 (two) times daily. Prn  . [DISCONTINUED] ALPRAZolam (XANAX) 0.25 MG tablet Take 1 tablet (0.25 mg total) by mouth daily as needed for anxiety.  . [DISCONTINUED] Fe Fum-FePoly-Vit C-Vit B3 (INTEGRA) 62.5-62.5-40-3 MG CAPS One tablet per day (Patient not taking: Reported on 04/02/2018)  . [DISCONTINUED] FeFum-FePo-FA-B Cmp-C-Zn-Mn-Cu (TANDEM PLUS) 162-115.2-1 MG CAPS TAKE  1 CAPSULE DAILY  . [DISCONTINUED] fluticasone (FLONASE) 50 MCG/ACT nasal spray Place 2 sprays into both nostrils daily. (Patient taking differently: Place 2 sprays into both nostrils 2 (two) times daily. )   No facility-administered encounter medications on file as of 05/21/2018.     Review of Systems  Constitutional: Positive for fatigue.       Decreased weight.    HENT: Negative for congestion and sinus pressure.   Respiratory: Negative for cough, chest tightness and shortness of breath.   Cardiovascular: Negative for chest pain, palpitations and leg swelling.  Gastrointestinal: Negative for abdominal pain, diarrhea, nausea and vomiting.  Genitourinary: Negative for difficulty urinating and dysuria.  Musculoskeletal: Negative for myalgias.       Seeing Dr [name] for her arthritis.  Overall joint pain - stable.    Skin: Negative for color change and rash.  Neurological: Negative for dizziness, light-headedness and headaches.  Psychiatric/Behavioral: Negative for agitation and dysphoric mood.       Objective:    Physical Exam  Constitutional: She appears well-developed and well-nourished. No distress.  HENT:  Nose: Nose normal.  Mouth/Throat: Oropharynx is clear and moist.  Neck: Neck supple. No thyromegaly present.  Cardiovascular: Normal rate and regular rhythm.  Pulmonary/Chest: Breath sounds normal. No respiratory distress. She has no wheezes.  Abdominal: Soft. Bowel sounds are normal. There is no tenderness.  Musculoskeletal: She exhibits no edema or tenderness.    Lymphadenopathy:    She has no cervical adenopathy.  Skin: No rash noted. No erythema.  Psychiatric: She has a normal mood and affect. Her behavior is normal.    BP 130/62 (BP Location: Left Arm, Patient Position: Sitting, Cuff Size: Normal)   Pulse 72   Temp 98.1 F (36.7 C) (Oral)   Resp 18   Wt 140 lb 3.2 oz (63.6 kg)   SpO2 96%   BMI 23.33 kg/m  Wt Readings from Last 3 Encounters:  05/21/18 140 lb 3.2 oz (63.6 kg)  04/02/18 145 lb (65.8 kg)  03/29/18 146 lb 6.4 oz (66.4 kg)     Lab Results  Component Value Date   WBC 5.5 05/21/2018   HGB 11.9 (L) 05/21/2018   HCT 35.0 (L) 05/21/2018   PLT 195.0 05/21/2018   GLUCOSE 119 (H) 05/21/2018   CHOL 142 07/14/2017   TRIG 112.0 07/14/2017   HDL 79.10 07/14/2017   LDLDIRECT 73.7 08/20/2013   LDLCALC 40 07/14/2017   ALT 32 05/21/2018   AST 47 (H) 05/21/2018   NA 135 05/21/2018   K 4.2 05/21/2018   CL 98 05/21/2018   CREATININE 1.10 05/21/2018   BUN 13 05/21/2018   CO2 29 05/21/2018   TSH 3.15 05/21/2018   INR 1.28 10/31/2016    Ct Abdomen Pelvis W Contrast  Result Date: 06/24/2017 CLINICAL DATA:  Sudden onset of abdominal pain. EXAM: CT ABDOMEN AND PELVIS WITH CONTRAST TECHNIQUE: Multidetector CT imaging of the abdomen and pelvis was performed using the standard protocol following bolus administration of intravenous contrast. CONTRAST:  113mL ISOVUE-300 IOPAMIDOL (ISOVUE-300) INJECTION 61% COMPARISON:  Abdominal ultrasound 06/12/2017.  CT 06/19/2016 FINDINGS: Lower chest: Mild cardiomegaly. No pleural fluid or consolidation. Moderate hiatal hernia with minimal fluid in the intrathoracic stomach. Hepatobiliary: Decreased hepatic density consistent with steatosis. Postcholecystectomy with clips in the gallbladder fossa. Stable dilatation of the common bile duct post cholecystectomy measuring 10 mm distally. Pancreas: No ductal dilatation or inflammation. 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 Spleen is normal in  size. Adrenals/Urinary Tract: Normal adrenal glands. No hydronephrosis. Homogeneous renal enhancement with symmetric excretion on delayed phase imaging. Symmetric mild bilateral perinephric edema is unchanged from prior exam. Urinary bladder is distended without wall thickening. Stomach/Bowel: Moderate hiatal hernia. Small amount of fluid in the intrathoracic and subdiaphragmatic stomach mild distal gastric wall thickening. No small bowel obstruction or inflammation. Majority of the colon is decompressed. Possible wall thickening involving the descending and proximal sigmoid colon. Descending and sigmoid diverticulosis without acute diverticulitis. Normal appendix. Vascular/Lymphatic: Scattered aortic atherosclerosis. No aneurysm. Prominent right inguinal nodes measuring up to 10 mm, likely reactive. No enlarged abdominal lymph nodes. Reproductive: Status post hysterectomy. No adnexal masses. Other: No free air, free fluid, or intra-abdominal fluid collection. Small fat containing umbilical hernia. Musculoskeletal: Bones are under mineralized with multilevel degenerative change in the spine. IMPRESSION: 1. Equivocal colonic wall thickening of the descending and proximal sigmoid colon versus nondistention. Possible mild colitis. 2. Moderate hiatal hernia with some intraluminal fluid, as well as mild distal gastric wall thickening, nonspecific but can be seen with gastritis. 3. Hepatic steatosis. Chronic biliary prominence postcholecystectomy. Electronically Signed   By: Jeb Levering M.D.   On: 06/24/2017 03:20       Assessment & Plan:   Problem List Items Addressed This Visit    A-fib ([place])    Feels better off amiodarone.  Appears to be in SR today.  Follow.  Continue to f/u with cardiology.      Anemia - Primary    With the fatigue, will recheck cbc and iron studies.        Relevant Medications   FeFum-FePo-FA-B Cmp-C-Zn-Mn-Cu (TANDEM PLUS)  162-115.2-1 MG CAPS   Other Relevant Orders   CBC with Differential/Platelet (Completed)   Ferritin (Completed)   IBC panel (Completed)   Anxiety    Did not tolerate buspar.  Feeling better than last visit.  Taking xanax.  Trying to cut back.  Follow.        Relevant Medications   ALPRAZolam (XANAX) 0.25 MG tablet   Diarrhea    Much improved on colestipol.        Fatty liver    Being followed by GI.  Recheck liver panel.        Relevant Orders   Hepatic function panel (Completed)   Hypercholesterolemia    On simvastatin.  Follow lipid panel and liver function tests.        Hypertension    Blood pressure under good control.  Continue same medication regimen.  Follow pressures.  Follow metabolic panel.        Relevant Orders   TSH (Completed)   Basic metabolic panel (Completed)   Obstructive sleep apnea    She has declined treatment.        Rheumatoid arthritis ([place])    Followed by Dr [name].  Stable.        Weight loss    Continued.  Discussed with her today.  Seeing GI. She feels she is doing better.  Wants to follow.  Check routine labs.  Wants to hold on further w/up at this time.         Other Visit Diagnoses    Need for vaccination with 13-polyvalent pneumococcal conjugate vaccine       Relevant Orders   Pneumococcal conjugate vaccine 13-valent (Completed)       [name], MD

## 2018-05-22 LAB — BASIC METABOLIC PANEL
BUN: 13 mg/dL (ref 6–23)
CHLORIDE: 98 meq/L (ref 96–112)
CO2: 29 meq/L (ref 19–32)
Calcium: 9.3 mg/dL (ref 8.4–10.5)
Creatinine, Ser: 1.1 mg/dL (ref 0.40–1.20)
GFR: 51.46 mL/min — AB (ref >60.00)
GLUCOSE: 119 mg/dL — AB (ref 70–99)
Potassium: 4.2 mEq/L (ref 3.5–5.1)
SODIUM: 135 meq/L (ref 135–145)

## 2018-05-22 LAB — CBC WITH DIFFERENTIAL/PLATELET
BASOS ABS: 0 10*3/uL (ref 0.0–0.1)
Basophils Relative: 0.5 % (ref 0.0–3.0)
EOS ABS: 0.1 10*3/uL (ref 0.0–0.7)
Eosinophils Relative: 1.1 % (ref 0.0–5.0)
HCT: 35 % — ABNORMAL LOW (ref 36.0–46.0)
HEMOGLOBIN: 11.9 g/dL — AB (ref 12.0–15.0)
LYMPHS PCT: 19.9 % (ref 12.0–46.0)
Lymphs Abs: 1.1 10*3/uL (ref 0.7–4.0)
MCHC: 34 g/dL (ref 30.0–36.0)
MCV: 93.6 fl (ref 78.0–100.0)
MONO ABS: 0.3 10*3/uL (ref 0.1–1.0)
Monocytes Relative: 5.7 % (ref 3.0–12.0)
Neutro Abs: 4 10*3/uL (ref 1.4–7.7)
Neutrophils Relative %: 72.8 % (ref 43.0–77.0)
Platelets: 195 10*3/uL (ref 150.0–400.0)
RBC: 3.73 Mil/uL — AB (ref 3.87–5.11)
RDW: 12.6 % (ref 11.5–15.5)
WBC: 5.5 10*3/uL (ref 4.0–10.5)

## 2018-05-22 LAB — FERRITIN: FERRITIN: 70.9 ng/mL (ref 10.0–291.0)

## 2018-05-22 LAB — HEPATIC FUNCTION PANEL
ALT: 32 U/L (ref 0–35)
AST: 47 U/L — ABNORMAL HIGH (ref 0–37)
Albumin: 4.1 g/dL (ref 3.5–5.2)
Alkaline Phosphatase: 34 U/L — ABNORMAL LOW (ref 39–117)
BILIRUBIN DIRECT: 0.1 mg/dL (ref 0.0–0.3)
BILIRUBIN TOTAL: 0.6 mg/dL (ref 0.2–1.2)
Total Protein: 6.8 g/dL (ref 6.0–8.3)

## 2018-05-22 LAB — TSH: TSH: 3.15 u[IU]/mL (ref 0.35–4.50)

## 2018-05-22 LAB — IBC PANEL
IRON: 59 ug/dL (ref 42–145)
SATURATION RATIOS: 16.7 % — AB (ref 20.0–50.0)
TRANSFERRIN: 252 mg/dL (ref 212.0–360.0)

## 2018-05-27 ENCOUNTER — Encounter: Payer: Self-pay | Admitting: Internal Medicine

## 2018-05-27 NOTE — Assessment & Plan Note (Signed)
Feels better off amiodarone.  Appears to be in SR today.  Follow.  Continue to f/u with cardiology.

## 2018-05-27 NOTE — Assessment & Plan Note (Signed)
Continued.  Discussed with her today.  Seeing GI. She feels she is doing better.  Wants to follow.  Check routine labs.  Wants to hold on further w/up at this time.

## 2018-05-27 NOTE — Assessment & Plan Note (Signed)
With the fatigue, will recheck cbc and iron studies.

## 2018-05-27 NOTE — Assessment & Plan Note (Signed)
Blood pressure under good control.  Continue same medication regimen.  Follow pressures.  Follow metabolic panel.   

## 2018-05-27 NOTE — Assessment & Plan Note (Signed)
She has declined treatment.

## 2018-05-27 NOTE — Assessment & Plan Note (Signed)
Did not tolerate buspar.  Feeling better than last visit.  Taking xanax.  Trying to cut back.  Follow.

## 2018-05-27 NOTE — Assessment & Plan Note (Signed)
Being followed by GI.  Recheck liver panel.

## 2018-05-27 NOTE — Assessment & Plan Note (Signed)
On simvastatin.  Follow lipid panel and liver function tests.   

## 2018-05-27 NOTE — Assessment & Plan Note (Signed)
Followed by Dr Kernodle.  Stable.  

## 2018-05-27 NOTE — Assessment & Plan Note (Signed)
Much improved on colestipol.

## 2018-05-28 DIAGNOSIS — I059 Rheumatic mitral valve disease, unspecified: Secondary | ICD-10-CM | POA: Diagnosis not present

## 2018-05-28 DIAGNOSIS — I480 Paroxysmal atrial fibrillation: Secondary | ICD-10-CM | POA: Diagnosis not present

## 2018-05-28 DIAGNOSIS — I10 Essential (primary) hypertension: Secondary | ICD-10-CM | POA: Diagnosis not present

## 2018-05-30 ENCOUNTER — Telehealth: Payer: Self-pay | Admitting: Internal Medicine

## 2018-05-30 NOTE — Telephone Encounter (Signed)
-----   Message from Lars Masson, LPN sent at [id number]/36/[id number]  2:45 PM EST ----- Regarding: RE: f/u thigh nodule Called patient. Nodule on thigh is no longer there. It has went away completely and she has not had any other issues.   ----- Message ----- From: [name], MD Sent: 05/27/2018   8:26 AM EST To: Lars Masson, LPN Subject: f/u thigh nodule                               Pt had previously been scheduled for ultrasound upper thigh for a thigh nodule.  She missed her ultrasound appt.  She was to reschedule.  When she was here the other day, she did not mention the nodule.  Has this resolved?  Please call pt for update.    Thanks Dr Nicki Reaper

## 2018-05-30 NOTE — Telephone Encounter (Signed)
-----   Message from Lars Masson, LPN sent at [id number]/20/9198  2:45 PM EST ----- Regarding: RE: f/u thigh nodule Called patient. Nodule on thigh is no longer there. It has went away completely and she has not had any other issues.   ----- Message ----- From: [name], MD Sent: 05/27/2018   8:26 AM EST To: Lars Masson, LPN Subject: f/u thigh nodule                               Pt had previously been scheduled for ultrasound upper thigh for a thigh nodule.  She missed her ultrasound appt.  She was to reschedule.  When she was here the other day, she did not mention the nodule.  Has this resolved?  Please call pt for update.    Thanks Dr Nicki Reaper

## 2018-06-04 ENCOUNTER — Other Ambulatory Visit: Payer: Self-pay | Admitting: Internal Medicine

## 2018-06-05 NOTE — Telephone Encounter (Signed)
Per pulmonary (Dr Alva Garnet) note, pt was to d/c advair.   Is she still using?

## 2018-06-12 ENCOUNTER — Other Ambulatory Visit: Payer: Self-pay | Admitting: Internal Medicine

## 2018-06-15 NOTE — Telephone Encounter (Signed)
Pt is no longer using advair

## 2018-06-18 ENCOUNTER — Other Ambulatory Visit: Payer: Self-pay | Admitting: Internal Medicine

## 2018-06-18 MED ORDER — ALPRAZOLAM 0.25 MG PO TABS: 0.2500 mg | ORAL_TABLET | Freq: Every day | ORAL | 0 refills | Status: DC | PRN

## 2018-06-18 NOTE — Telephone Encounter (Signed)
Last OV 05/21/2018  Next OV 07/12/18 Last refill 05/21/2018

## 2018-06-18 NOTE — Telephone Encounter (Signed)
rx ok'd for xanax #90 with no refills.

## 2018-06-18 NOTE — Telephone Encounter (Signed)
Copied from [place] [id number]. Topic: Quick Communication - Rx Refill/Question >> Jun 18, 2018  1:12 PM Blase Mess A wrote: Medication: ALPRAZolam [name]) 0.25 MG tablet [[id number]]  Requesting a 56month supply  Has the patient contacted their pharmacy? Yes  (Agent: If no, request that the patient contact the pharmacy for the refill.) (Agent: If yes, when and what did the pharmacy advise?)  Preferred Pharmacy (with phone number or street name): [facility], [place] - [address] [facility] [address] [address] Phone: [phone] Fax: [phone]    Agent: Please be advised that RX refills may take up to 3 business days. We ask that you follow-up with your pharmacy.

## 2018-06-26 ENCOUNTER — Other Ambulatory Visit: Payer: Self-pay | Admitting: Student

## 2018-06-26 DIAGNOSIS — K760 Fatty (change of) liver, not elsewhere classified: Secondary | ICD-10-CM

## 2018-07-02 DIAGNOSIS — M05731 Rheumatoid arthritis with rheumatoid factor of right wrist without organ or systems involvement: Secondary | ICD-10-CM | POA: Diagnosis not present

## 2018-07-02 DIAGNOSIS — M05732 Rheumatoid arthritis with rheumatoid factor of left wrist without organ or systems involvement: Secondary | ICD-10-CM | POA: Diagnosis not present

## 2018-07-02 DIAGNOSIS — M0579 Rheumatoid arthritis with rheumatoid factor of multiple sites without organ or systems involvement: Secondary | ICD-10-CM | POA: Diagnosis not present

## 2018-07-12 ENCOUNTER — Encounter: Payer: Self-pay | Admitting: Internal Medicine

## 2018-07-12 ENCOUNTER — Encounter

## 2018-07-12 ENCOUNTER — Ambulatory Visit (INDEPENDENT_AMBULATORY_CARE_PROVIDER_SITE_OTHER): Payer: Medicare Other | Admitting: Internal Medicine

## 2018-07-12 VITALS — BP 140/78 | HR 78 | Temp 98.0°F | Resp 16 | Wt 134.8 lb

## 2018-07-12 DIAGNOSIS — I4891 Unspecified atrial fibrillation: Secondary | ICD-10-CM | POA: Diagnosis not present

## 2018-07-12 DIAGNOSIS — G479 Sleep disorder, unspecified: Secondary | ICD-10-CM

## 2018-07-12 DIAGNOSIS — G629 Polyneuropathy, unspecified: Secondary | ICD-10-CM

## 2018-07-12 DIAGNOSIS — K219 Gastro-esophageal reflux disease without esophagitis: Secondary | ICD-10-CM

## 2018-07-12 DIAGNOSIS — K838 Other specified diseases of biliary tract: Secondary | ICD-10-CM

## 2018-07-12 DIAGNOSIS — D649 Anemia, unspecified: Secondary | ICD-10-CM | POA: Diagnosis not present

## 2018-07-12 DIAGNOSIS — K529 Noninfective gastroenteritis and colitis, unspecified: Secondary | ICD-10-CM

## 2018-07-12 DIAGNOSIS — K760 Fatty (change of) liver, not elsewhere classified: Secondary | ICD-10-CM | POA: Diagnosis not present

## 2018-07-12 DIAGNOSIS — R05 Cough: Secondary | ICD-10-CM

## 2018-07-12 DIAGNOSIS — R634 Abnormal weight loss: Secondary | ICD-10-CM

## 2018-07-12 DIAGNOSIS — I10 Essential (primary) hypertension: Secondary | ICD-10-CM

## 2018-07-12 DIAGNOSIS — F419 Anxiety disorder, unspecified: Secondary | ICD-10-CM | POA: Diagnosis not present

## 2018-07-12 DIAGNOSIS — E7800 Pure hypercholesterolemia, unspecified: Secondary | ICD-10-CM | POA: Diagnosis not present

## 2018-07-12 DIAGNOSIS — M069 Rheumatoid arthritis, unspecified: Secondary | ICD-10-CM

## 2018-07-12 DIAGNOSIS — R059 Cough, unspecified: Secondary | ICD-10-CM

## 2018-07-12 MED ORDER — BUSPIRONE HCL 5 MG PO TABS: 5.0000 mg | ORAL_TABLET | Freq: Every evening | ORAL | 1 refills | Status: DC | PRN

## 2018-07-12 NOTE — Progress Notes (Signed)
Patient ID: Kaitlyn Huff, female   DOB: Mar 15, 1941, 78 y.o.   MRN: [id number]   Subjective:    Patient ID: Kaitlyn Huff, female    DOB: 14-Mar-1941, 78 y.o.   MRN: [id number]  HPI  Patient here for a scheduled follow up.  Saw cardiology 05/28/18 for f/u afib and peripheral edema.  Off all cardiac medications.  Felt to be stable.  No changes made.  Recommended f/u in 6 months.  Previously hospitalized for colitis. Has seen GI.  Bowels better.  They have her scheduled for f/u abdominal ultrasound. Abnormal LFTs.  She has adjusted her diet.  Decreased sweets and sodas.  Weight continuing to decrease.  Some decreased appetite.  Off iron.  Has been off for months.  Taking prednisone - 2.5mg  q day.  Some occasional cough.  No increased congestion.  Breathing stable.  No chest pain.     Past Medical History:  Diagnosis Date  . Anemia   . Cancer ([place])   . Diverticulitis   . GERD (gastroesophageal reflux disease)   . Hyperlipidemia   . Hypertension   . Osteoarthritis   . Osteoporosis    actonel  . Positive PPD    s/p INH (2006)  . Rheumatoid arthritis(714.0)    MTX transaminitis, Leflunomide (rash), enbrel, plaquinil, prednisone, remicade, Imuran (transaminitis)  . 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 ABDOMINAL HYSTERECTOMY    . CERVICAL CONE BIOPSY    . CHOLECYSTECTOMY  06/22/14  . COLONOSCOPY WITH PROPOFOL N/A 07/09/2015   Procedure: COLONOSCOPY WITH PROPOFOL;  Surgeon: Manya Silvas, MD;  Location: [facility] ENDOSCOPY;  Service: Endoscopy;  Laterality: N/A;  . ESOPHAGOGASTRODUODENOSCOPY (EGD) WITH PROPOFOL N/A 07/09/2015   Procedure: ESOPHAGOGASTRODUODENOSCOPY (EGD) WITH PROPOFOL;  Surgeon: Manya Silvas, MD;  Location: [facility] ENDOSCOPY;  Service: Endoscopy;  Laterality: N/A;  . OVARY SURGERY    . TRACHEOSTOMY  1959  . TUBAL LIGATION     Family History  Problem Relation Age of Onset  . Heart disease Father    MI  . Heart disease Mother   . Valvular heart disease Mother   . Breast cancer Sister 78  . Cancer Sister        Lung cancer  . COPD Sister   . Heart disease Sister   . Colon cancer Neg Hx    Social History   Socioeconomic History  . Marital status: Widowed    Spouse name: Not on file  . Number of children: 4  . Years of education: Not on file  . Highest education level: Not on file  Occupational History  . Occupation: retired  Scientific laboratory technician  . Financial resource strain: Not on file  . Food insecurity:    Worry: Not on file    Inability: Not on file  . Transportation needs:    Medical: Not on file    Non-medical: Not on file  Tobacco Use  . Smoking status: Never Smoker  . Smokeless tobacco: Never Used  Substance and Sexual Activity  . Alcohol use: No    Alcohol/week: 0.0 standard drinks  . Drug use: No  . Sexual activity: Never  Lifestyle  . Physical activity:    Days per week: Not on file    Minutes per session: Not on file  . Stress: Not on file  Relationships  . Social connections:    Talks on phone: Not on file  Gets together: Not on file    Attends religious service: Not on file    Active member of club or organization: Not on file    Attends meetings of clubs or organizations: Not on file    Relationship status: Not on file  Other Topics Concern  . Not on file  Social History Narrative   Lives with family at home    Outpatient Encounter Medications as of 07/12/2018  Medication Sig  . acetaminophen (TYLENOL) 325 MG tablet Take 2 tablets (650 mg total) by mouth every 6 (six) hours as needed for mild pain (or Fever >/= 101). (Patient not taking: Reported on 04/02/2018)  . acidophilus (RISAQUAD) CAPS capsule Take 1 capsule by mouth daily.  Marland Kitchen albuterol (PROAIR HFA) 108 (90 Base) MCG/ACT inhaler USE 2 INHALATIONS EVERY 6 HOURS AS NEEDED FOR WHEEZING OR SHORTNESS OF BREATH  . alendronate (FOSAMAX) 70 MG tablet Take 70 mg by mouth once a week.   . ALPRAZolam  (XANAX) 0.25 MG tablet Take 1 tablet (0.25 mg total) by mouth daily as needed for anxiety.  Marland Kitchen aspirin EC 81 MG tablet Take 81 mg by mouth daily.  . busPIRone (BUSPAR) 5 MG tablet Take 1 tablet (5 mg total) by mouth at bedtime as needed.  . Calcium Carbonate-Vitamin D (CALCIUM 600+D) 600-400 MG-UNIT tablet Take 1 tablet by mouth 2 (two) times daily.   . cetirizine (ZYRTEC) 10 MG tablet TAKE 1 TABLET DAILY  . cholecalciferol (VITAMIN D) 1000 units tablet Take 1,000 Units by mouth daily.  . colestipol (COLESTID) 1 g tablet Take 2 g by mouth daily.   Marland Kitchen esomeprazole (NEXIUM) 20 MG capsule Take 20 mg by mouth daily at 12 noon.  Marland Kitchen FeFum-FePo-FA-B Cmp-C-Zn-Mn-Cu (TANDEM PLUS) 162-115.2-1 MG CAPS Take 1 capsule by mouth daily.  . fluticasone (FLONASE) 50 MCG/ACT nasal spray Place 2 sprays into both nostrils daily.  Marland Kitchen guaiFENesin-dextromethorphan (ROBITUSSIN DM) 100-10 MG/5ML syrup Take 5 mLs by mouth every 4 (four) hours as needed for cough.  . hydroxychloroquine (PLAQUENIL) 200 MG tablet Take 200 mg by mouth 2 (two) times daily.   Marland Kitchen lip balm (BLISTEX) OINT Apply 1 application topically as needed for lip care.  . meclizine (ANTIVERT) 25 MG tablet take 1/2 to 1 tablet every 8 hours if needed for VERTIGO  . Multiple Vitamin (MULTIVITAMIN WITH MINERALS) TABS tablet Take 1 tablet by mouth daily.  . ondansetron (ZOFRAN ODT) 4 MG disintegrating tablet Take 1 tablet (4 mg total) by mouth every 8 (eight) hours as needed for nausea or vomiting.  . potassium chloride (KLOR-CON SPRINKLE) 10 MEQ CR capsule TAKE 2 CAPSULES Once a day  . predniSONE (DELTASONE) 5 MG tablet Take 1 tablet (5 mg total) by mouth daily with breakfast.  . simvastatin (ZOCOR) 20 MG tablet TAKE 1 TABLET DAILY  . sodium chloride (OCEAN) 0.65 % nasal spray Place 1 spray into the nose as needed for congestion.  . traMADol (ULTRAM) 50 MG tablet Take 50 mg by mouth 2 (two) times daily as needed for moderate pain.  Marland Kitchen triamcinolone cream (KENALOG)  0.1 % Apply 1 application topically 2 (two) times daily. Prn  . [DISCONTINUED] busPIRone (BUSPAR) 5 MG tablet Take 1 tablet (5 mg total) by mouth at bedtime as needed.  . [DISCONTINUED] hydrOXYzine (ATARAX/VISTARIL) 25 MG tablet Take 1 tablet (25 mg total) by mouth 3 (three) times daily as needed. If you take at night do not take with xanax   No facility-administered encounter medications on file as of  07/12/2018.     Review of Systems  Constitutional: Positive for appetite change. Negative for fever.       Persistent weight loss.    HENT: Negative for congestion and sinus pressure.   Respiratory: Negative for chest tightness and shortness of breath.        Occasional cough.   Cardiovascular: Negative for chest pain, palpitations and leg swelling.  Gastrointestinal: Negative for abdominal pain and vomiting.       Bowels better now.    Genitourinary: Negative for difficulty urinating and dysuria.  Musculoskeletal: Negative for myalgias.       Followed by rheumatology for her joints. On low dose prednisone.    Skin: Negative for color change and rash.  Neurological: Negative for dizziness, light-headedness and headaches.  Psychiatric/Behavioral: Negative for agitation and dysphoric mood.       Objective:    Physical Exam Constitutional:      General: She is not in acute distress.    Appearance: Normal appearance.  HENT:     Nose: Nose normal. No congestion.     Mouth/Throat:     Pharynx: No oropharyngeal exudate or posterior oropharyngeal erythema.  Neck:     Musculoskeletal: Neck supple. No muscular tenderness.     Thyroid: No thyromegaly.  Cardiovascular:     Rate and Rhythm: Normal rate and regular rhythm.  Pulmonary:     Effort: No respiratory distress.     Breath sounds: Normal breath sounds. No wheezing.  Abdominal:     General: Bowel sounds are normal.     Palpations: Abdomen is soft.     Tenderness: There is no abdominal tenderness.  Musculoskeletal:         General: No swelling or tenderness.  Lymphadenopathy:     Cervical: No cervical adenopathy.  Skin:    Findings: No erythema or rash.  Neurological:     Mental Status: She is alert.  Psychiatric:        Mood and Affect: Mood normal.        Behavior: Behavior normal.     BP 140/78 (BP Location: Left Arm, Patient Position: Sitting, Cuff Size: Normal)   Pulse 78   Temp 98 F (36.7 C) (Oral)   Resp 16   Wt 134 lb 12.8 oz (61.1 kg)   SpO2 97%   BMI 22.43 kg/m  Wt Readings from Last 3 Encounters:  07/12/18 134 lb 12.8 oz (61.1 kg)  05/21/18 140 lb 3.2 oz (63.6 kg)  04/02/18 145 lb (65.8 kg)     Lab Results  Component Value Date   WBC 7.5 07/12/2018   HGB 12.7 07/12/2018   HCT 37.5 07/12/2018   PLT 200.0 07/12/2018   GLUCOSE 105 (H) 07/12/2018   CHOL 141 07/12/2018   TRIG 80.0 07/12/2018   HDL 81.80 07/12/2018   LDLDIRECT 73.7 08/20/2013   LDLCALC 43 07/12/2018   ALT 32 07/12/2018   AST 48 (H) 07/12/2018   NA 135 07/12/2018   K 5.0 07/12/2018   CL 98 07/12/2018   CREATININE 1.05 07/12/2018   BUN 11 07/12/2018   CO2 28 07/12/2018   TSH 3.15 05/21/2018   INR 1.28 10/31/2016    Ct Abdomen Pelvis W Contrast  Result Date: 06/24/2017 CLINICAL DATA:  Sudden onset of abdominal pain. EXAM: CT ABDOMEN AND PELVIS WITH CONTRAST TECHNIQUE: Multidetector CT imaging of the abdomen and pelvis was performed using the standard protocol following bolus administration of intravenous contrast. CONTRAST:  14mL ISOVUE-300 IOPAMIDOL (ISOVUE-300) INJECTION 61%  COMPARISON:  Abdominal ultrasound 06/12/2017.  CT 06/19/2016 FINDINGS: Lower chest: Mild cardiomegaly. No pleural fluid or consolidation. Moderate hiatal hernia with minimal fluid in the intrathoracic stomach. Hepatobiliary: Decreased hepatic density consistent with steatosis. Postcholecystectomy with clips in the gallbladder fossa. Stable dilatation of the common bile duct post cholecystectomy measuring 10 mm distally. Pancreas: No  ductal dilatation or inflammation. 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 Spleen is normal in size. Adrenals/Urinary Tract: Normal adrenal glands. No hydronephrosis. Homogeneous renal enhancement with symmetric excretion on delayed phase imaging. Symmetric mild bilateral perinephric edema is unchanged from prior exam. Urinary bladder is distended without wall thickening. Stomach/Bowel: Moderate hiatal hernia. Small amount of fluid in the intrathoracic and subdiaphragmatic stomach mild distal gastric wall thickening. No small bowel obstruction or inflammation. Majority of the colon is decompressed. Possible wall thickening involving the descending and proximal sigmoid colon. Descending and sigmoid diverticulosis without acute diverticulitis. Normal appendix. Vascular/Lymphatic: Scattered aortic atherosclerosis. No aneurysm. Prominent right inguinal nodes measuring up to 10 mm, likely reactive. No enlarged abdominal lymph nodes. Reproductive: Status post hysterectomy. No adnexal masses. Other: No free air, free fluid, or intra-abdominal fluid collection. Small fat containing umbilical hernia. Musculoskeletal: Bones are under mineralized with multilevel degenerative change in the spine. IMPRESSION: 1. Equivocal colonic wall thickening of the descending and proximal sigmoid colon versus nondistention. Possible mild colitis. 2. Moderate hiatal hernia with some intraluminal fluid, as well as mild distal gastric wall thickening, nonspecific but can be seen with gastritis. 3. Hepatic steatosis. Chronic biliary prominence postcholecystectomy. Electronically Signed   By: Jeb Levering M.D.   On: 06/24/2017 03:20       Assessment & Plan:   Problem List Items Addressed This Visit    A-fib ([place])    Feels better off amiodarone.  Appears to be in SR.  Saw cardiology in 05/2018.  Stable.  Follow.        Anemia - Primary    Recheck cbc and ferritin.  Off iron.         Relevant Orders   CBC with Differential/Platelet (Completed)   Ferritin (Completed)   IBC panel (Completed)   Anxiety    She had previously reported intolerance to buspar.  Today states she was not sure and wanted to try again.  Can retry buspar.  Has xanax if needed.  Would like to eventually transition her off xanax.  Follow.        Relevant Medications   busPIRone (BUSPAR) 5 MG tablet   Colitis    Recently admitted and treated.  Bowels doing better.  Still with decreased appetite and weight loss.  Discussed further w/up.  Has previously seen GI.  Scheduled for f/u ultrasound to f/u abnormal liver function tests.  With some cough, decreased appetite and persistent decreased weight discussed CT scan chest, abdomen and pelvis.  Obtain labs.  F/u scans pending lab results.        Cough    Some cough as outlined.  Consider scan as discussed.       Difficulty sleeping    Discussed with her today.  She wants to retry buspar.  Follow.        Dilated bile duct    Has been evaluated by GI.  See above for discussion of f/u CT.       Fatty liver    Being followed by GI.  Follow liver function tests.        Relevant Orders   Hepatic function  panel (Completed)   GERD (gastroesophageal reflux disease)    On nexium.        Hypercholesterolemia    On simvastatin.  Low cholesterol diet and exercise.  Follow lipid panel and liver function tests.        Relevant Orders   Lipid panel (Completed)   Hypertension    Blood pressure under good control.  On no medication.  Follow pressures.  Follow metabolic panel.        Relevant Orders   Basic metabolic panel (Completed)   Rheumatoid arthritis ([place])    Followed by Dr [name].  Stable.        Weight loss    Continued.  Discussed with her today.  She has adjusted her diet, but continues to lose weight.  With persistent weight loss, some cough and decreased appetite, discussed CT chest/abdomen and pelvis.  Obtain labs.  Pending  results, consider scan.        Relevant Orders   Amylase (Completed)   Lipase (Completed)    Other Visit Diagnoses    Neuropathy       Relevant Orders   Vitamin B12 (Completed)       [name], MD

## 2018-07-13 ENCOUNTER — Ambulatory Visit
Admission: RE | Admit: 2018-07-13 | Discharge: 2018-07-13 | Disposition: A | Discharge status: Home / Self Care | Payer: Medicare Other | Source: Ambulatory Visit | Attending: Internal Medicine | Admitting: Internal Medicine

## 2018-07-13 DIAGNOSIS — Z1231 Encounter for screening mammogram for malignant neoplasm of breast: Secondary | ICD-10-CM | POA: Diagnosis not present

## 2018-07-13 LAB — LIPID PANEL
CHOL/HDL RATIO: 2
Cholesterol: 141 mg/dL (ref 0–200)
HDL: 81.8 mg/dL (ref >39.00)
LDL CALC: 43 mg/dL (ref 0–99)
NonHDL: 59.26
TRIGLYCERIDES: 80 mg/dL (ref 0.0–149.0)
VLDL: 16 mg/dL (ref 0.0–40.0)

## 2018-07-13 LAB — CBC WITH DIFFERENTIAL/PLATELET
BASOS ABS: 0.1 10*3/uL (ref 0.0–0.1)
BASOS PCT: 0.7 % (ref 0.0–3.0)
EOS ABS: 0 10*3/uL (ref 0.0–0.7)
Eosinophils Relative: 0.6 % (ref 0.0–5.0)
HEMATOCRIT: 37.5 % (ref 36.0–46.0)
HEMOGLOBIN: 12.7 g/dL (ref 12.0–15.0)
Lymphocytes Relative: 20.1 % (ref 12.0–46.0)
Lymphs Abs: 1.5 10*3/uL (ref 0.7–4.0)
MCHC: 34 g/dL (ref 30.0–36.0)
MCV: 93.5 fl (ref 78.0–100.0)
MONO ABS: 0.4 10*3/uL (ref 0.1–1.0)
Monocytes Relative: 5.5 % (ref 3.0–12.0)
Neutro Abs: 5.5 10*3/uL (ref 1.4–7.7)
Neutrophils Relative %: 73.1 % (ref 43.0–77.0)
Platelets: 200 10*3/uL (ref 150.0–400.0)
RBC: 4.01 Mil/uL (ref 3.87–5.11)
RDW: 12.7 % (ref 11.5–15.5)
WBC: 7.5 10*3/uL (ref 4.0–10.5)

## 2018-07-13 LAB — IBC PANEL
Iron: 103 ug/dL (ref 42–145)
Saturation Ratios: 30.8 % (ref 20.0–50.0)
Transferrin: 239 mg/dL (ref 212.0–360.0)

## 2018-07-13 LAB — HEPATIC FUNCTION PANEL
ALK PHOS: 35 U/L — AB (ref 39–117)
ALT: 32 U/L (ref 0–35)
AST: 48 U/L — AB (ref 0–37)
Albumin: 4.2 g/dL (ref 3.5–5.2)
BILIRUBIN DIRECT: 0.1 mg/dL (ref 0.0–0.3)
BILIRUBIN TOTAL: 0.6 mg/dL (ref 0.2–1.2)
TOTAL PROTEIN: 6.8 g/dL (ref 6.0–8.3)

## 2018-07-13 LAB — AMYLASE: Amylase: 29 U/L (ref 27–131)

## 2018-07-13 LAB — BASIC METABOLIC PANEL
BUN: 11 mg/dL (ref 6–23)
CHLORIDE: 98 meq/L (ref 96–112)
CO2: 28 mEq/L (ref 19–32)
Calcium: 9.4 mg/dL (ref 8.4–10.5)
Creatinine, Ser: 1.05 mg/dL (ref 0.40–1.20)
GFR: 54.27 mL/min — ABNORMAL LOW (ref >60.00)
Glucose, Bld: 105 mg/dL — ABNORMAL HIGH (ref 70–99)
POTASSIUM: 5 meq/L (ref 3.5–5.1)
Sodium: 135 mEq/L (ref 135–145)

## 2018-07-13 LAB — FERRITIN: Ferritin: 77.2 ng/mL (ref 10.0–291.0)

## 2018-07-13 LAB — VITAMIN B12: Vitamin B-12: 535 pg/mL (ref 211–911)

## 2018-07-13 LAB — LIPASE: Lipase: 56 U/L (ref 11.0–59.0)

## 2018-07-15 ENCOUNTER — Encounter: Payer: Self-pay | Admitting: Internal Medicine

## 2018-07-15 ENCOUNTER — Other Ambulatory Visit: Payer: Self-pay | Admitting: Internal Medicine

## 2018-07-15 DIAGNOSIS — E875 Hyperkalemia: Secondary | ICD-10-CM

## 2018-07-15 NOTE — Progress Notes (Signed)
Order placed for f/u potassium.  

## 2018-07-15 NOTE — Assessment & Plan Note (Signed)
Recently admitted and treated.  Bowels doing better.  Still with decreased appetite and weight loss.  Discussed further w/up.  Has previously seen GI.  Scheduled for f/u ultrasound to f/u abnormal liver function tests.  With some cough, decreased appetite and persistent decreased weight discussed CT scan chest, abdomen and pelvis.  Obtain labs.  F/u scans pending lab results.

## 2018-07-15 NOTE — Assessment & Plan Note (Signed)
Feels better off amiodarone.  Appears to be in SR.  Saw cardiology in 05/2018.  Stable.  Follow.

## 2018-07-15 NOTE — Assessment & Plan Note (Signed)
Discussed with her today.  She wants to retry buspar.  Follow.

## 2018-07-15 NOTE — Assessment & Plan Note (Signed)
Some cough as outlined.  Consider scan as discussed.

## 2018-07-15 NOTE — Assessment & Plan Note (Signed)
Continued.  Discussed with her today.  She has adjusted her diet, but continues to lose weight.  With persistent weight loss, some cough and decreased appetite, discussed CT chest/abdomen and pelvis.  Obtain labs.  Pending results, consider scan.

## 2018-07-15 NOTE — Assessment & Plan Note (Signed)
On nexium

## 2018-07-15 NOTE — Assessment & Plan Note (Signed)
Blood pressure under good control.  On no medication.  Follow pressures.  Follow metabolic panel.   

## 2018-07-15 NOTE — Assessment & Plan Note (Signed)
Being followed by GI.  Follow liver function tests.   

## 2018-07-15 NOTE — Assessment & Plan Note (Signed)
She had previously reported intolerance to buspar.  Today states she was not sure and wanted to try again.  Can retry buspar.  Has xanax if needed.  Would like to eventually transition her off xanax.  Follow.

## 2018-07-15 NOTE — Assessment & Plan Note (Signed)
Has been evaluated by GI.  See above for discussion of f/u CT.

## 2018-07-15 NOTE — Assessment & Plan Note (Signed)
Recheck cbc and ferritin.  Off iron.

## 2018-07-15 NOTE — Assessment & Plan Note (Signed)
Followed by Dr Kernodle.  Stable.  

## 2018-07-15 NOTE — Assessment & Plan Note (Signed)
On simvastatin.  Low cholesterol diet and exercise.  Follow lipid panel and liver function tests.   

## 2018-07-17 ENCOUNTER — Ambulatory Visit: Payer: Medicare Other

## 2018-07-18 ENCOUNTER — Other Ambulatory Visit: Payer: Self-pay | Admitting: Internal Medicine

## 2018-07-18 ENCOUNTER — Ambulatory Visit: Payer: Medicare Other

## 2018-07-18 DIAGNOSIS — W57XXXA Bitten or stung by nonvenomous insect and other nonvenomous arthropods, initial encounter: Secondary | ICD-10-CM

## 2018-07-18 DIAGNOSIS — L508 Other urticaria: Secondary | ICD-10-CM

## 2018-07-18 DIAGNOSIS — S80862A Insect bite (nonvenomous), left lower leg, initial encounter: Secondary | ICD-10-CM

## 2018-07-18 NOTE — Telephone Encounter (Signed)
Copied from [place] [id number]. Topic: Quick Communication - Rx Refill/Question >> Jul 18, 2018  1:09 PM Ahmed Prima L wrote: Medication: hydrOXYzine (ATARAX/VISTARIL) 25 MG tablet [[id number]]  DISCONTINUED   Has the patient contacted their pharmacy? No because it was discontinued. She said she is itching all her body (Agent: If no, request that the patient contact the pharmacy for the refill.) (Agent: If yes, when and what did the pharmacy advise?)  Preferred Pharmacy (with phone number or street name): [place], [place] - [place] [place] AT [facility] [address] [phone]    Agent: Please be advised that RX refills may take up to 3 business days. We ask that you follow-up with your pharmacy.

## 2018-07-18 NOTE — Telephone Encounter (Signed)
Patient is experiencing itching for some time- she has broken out with rash on arms and legs. Patient has not had anything to take for them.

## 2018-07-19 NOTE — Telephone Encounter (Signed)
Pt is checking the status of this, please advise.

## 2018-07-20 MED ORDER — HYDROXYZINE HCL 25 MG PO TABS: 25.0000 mg | ORAL_TABLET | Freq: Every day | ORAL | 0 refills | Status: DC | PRN

## 2018-07-20 NOTE — Telephone Encounter (Signed)
Patient thinks that itching was coming from Alpha Lipoic Acid. She has stopped taking. Advised that PCP recommended referral to dermatology and this was temporary supply. Pt declined.

## 2018-07-20 NOTE — Telephone Encounter (Signed)
I have sent in rx for hydroxyzine.  If she takes at night, she is not to take with xanax or buspar.  Also, this is a temporary supply.  Given her persistent problem with itching, I want to refer her to dermatology for further evaluation.  If agreeable, let me know and I will place the order for the referral.

## 2018-07-21 ENCOUNTER — Other Ambulatory Visit: Payer: Self-pay | Admitting: Internal Medicine

## 2018-07-21 DIAGNOSIS — R05 Cough: Secondary | ICD-10-CM

## 2018-07-21 DIAGNOSIS — R634 Abnormal weight loss: Secondary | ICD-10-CM

## 2018-07-21 DIAGNOSIS — R059 Cough, unspecified: Secondary | ICD-10-CM

## 2018-07-21 NOTE — Progress Notes (Signed)
Order placed for cxr.  

## 2018-07-23 ENCOUNTER — Other Ambulatory Visit: Payer: Self-pay | Admitting: Internal Medicine

## 2018-07-23 ENCOUNTER — Ambulatory Visit: Payer: Medicare Other | Admitting: Internal Medicine

## 2018-07-23 ENCOUNTER — Other Ambulatory Visit (INDEPENDENT_AMBULATORY_CARE_PROVIDER_SITE_OTHER): Payer: Medicare Other

## 2018-07-23 ENCOUNTER — Ambulatory Visit (INDEPENDENT_AMBULATORY_CARE_PROVIDER_SITE_OTHER): Payer: Medicare Other

## 2018-07-23 DIAGNOSIS — E875 Hyperkalemia: Secondary | ICD-10-CM | POA: Diagnosis not present

## 2018-07-23 DIAGNOSIS — R05 Cough: Secondary | ICD-10-CM

## 2018-07-23 DIAGNOSIS — E876 Hypokalemia: Secondary | ICD-10-CM

## 2018-07-23 DIAGNOSIS — J984 Other disorders of lung: Secondary | ICD-10-CM | POA: Diagnosis not present

## 2018-07-23 DIAGNOSIS — R634 Abnormal weight loss: Secondary | ICD-10-CM

## 2018-07-23 DIAGNOSIS — R059 Cough, unspecified: Secondary | ICD-10-CM

## 2018-07-23 LAB — POTASSIUM: Potassium: 3.5 mEq/L (ref 3.5–5.1)

## 2018-07-23 NOTE — Progress Notes (Signed)
Order placed for f/u potassium.  

## 2018-07-24 ENCOUNTER — Ambulatory Visit: Payer: Medicare Other

## 2018-07-27 ENCOUNTER — Ambulatory Visit
Admission: RE | Admit: 2018-07-27 | Discharge: 2018-07-27 | Disposition: A | Discharge status: Home / Self Care | Payer: Medicare Other | Source: Ambulatory Visit | Attending: Student | Admitting: Student

## 2018-07-27 DIAGNOSIS — K760 Fatty (change of) liver, not elsewhere classified: Secondary | ICD-10-CM | POA: Diagnosis not present

## 2018-08-02 ENCOUNTER — Other Ambulatory Visit: Payer: Medicare Other

## 2018-08-03 ENCOUNTER — Other Ambulatory Visit (INDEPENDENT_AMBULATORY_CARE_PROVIDER_SITE_OTHER): Payer: Medicare Other

## 2018-08-03 ENCOUNTER — Ambulatory Visit (INDEPENDENT_AMBULATORY_CARE_PROVIDER_SITE_OTHER): Payer: Medicare Other

## 2018-08-03 VITALS — Resp 15 | Ht 64.0 in | Wt 134.8 lb

## 2018-08-03 DIAGNOSIS — Z0000 Encounter for general adult medical examination without abnormal findings: Secondary | ICD-10-CM

## 2018-08-03 DIAGNOSIS — E876 Hypokalemia: Secondary | ICD-10-CM

## 2018-08-03 LAB — POTASSIUM: Potassium: 3.4 mEq/L — ABNORMAL LOW (ref 3.5–5.1)

## 2018-08-03 NOTE — Patient Instructions (Addendum)
  Kaitlyn Huff , Thank you for taking time to come for your Medicare Wellness Visit. I appreciate your ongoing commitment to your health goals. Please review the following plan we discussed and let me know if I can assist you in the future.   Lab recheck.   Follow up as needed.    Bring a copy of your [place] and/or Living Will to be scanned into chart.  Have a great day!  These are the goals we discussed: Goals      Patient Stated   . Healthy Lifestyle (pt-stated)     Stay active Eat when hungry       This is a list of the screening recommended for you and due dates:  Health Maintenance  Topic Date Due  . Tetanus Vaccine  01/20/2019  . Colon Cancer Screening  07/08/2020  . Flu Shot  Completed  . DEXA scan (bone density measurement)  Completed  . Pneumonia vaccines  Completed

## 2018-08-03 NOTE — Progress Notes (Signed)
Subjective:   Kaitlyn Huff is a 78 y.o. female who presents for Medicare Annual (Subsequent) preventive examination.  Review of Systems:  No ROS.  Medicare Wellness Visit. Additional risk factors are reflected in the social history. Cardiac Risk Factors include: advanced age (>60men, >21 women);hypertension     Objective:     Vitals: Resp 15   Ht 5\' 4"  (1.626 m)   Wt 134 lb 12.8 oz (61.1 kg)   BMI 23.14 kg/m   Body mass index is 23.14 kg/m.  Advanced Directives 08/03/2018 07/14/2017 06/23/2017 05/11/2017 12/22/2016 12/22/2016 12/22/2016  Does Patient Have a Medical Advance Directive? Yes Yes No Yes Yes - Yes  Type of Programmer, systems - Healthcare Power of [place];Living will Healthcare Power of [place]  Does patient want to make changes to medical advance directive? No - Patient declined No - Patient declined - - No - Patient declined - -  Copy of [place] in Chart? No - copy requested Yes - - - Yes No - copy requested  Would patient like information on creating a medical advance directive? - - No - Patient declined - - No - Patient declined -    Tobacco Social History   Tobacco Use  Smoking Status Never Smoker  Smokeless Tobacco Never Used     Counseling given: Not Answered   Clinical Intake:  Pre-visit preparation completed: Yes        Diabetes: No  How often do you need to have someone help you when you read instructions, pamphlets, or other written materials from your doctor or pharmacy?: 1 - Never  Interpreter Needed?: No     Past Medical History:  Diagnosis Date  . Anemia   . Cancer ([place])   . Diverticulitis   . GERD (gastroesophageal reflux disease)   . Hyperlipidemia   . Hypertension   . Osteoarthritis   . Osteoporosis    actonel  . Positive PPD    s/p INH (2006)  . Rheumatoid arthritis(714.0)    MTX  transaminitis, Leflunomide (rash), enbrel, plaquinil, prednisone, remicade, Imuran (transaminitis)  . 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 ABDOMINAL HYSTERECTOMY    . CERVICAL CONE BIOPSY    . CHOLECYSTECTOMY  06/22/14  . COLONOSCOPY WITH PROPOFOL N/A 07/09/2015   Procedure: COLONOSCOPY WITH PROPOFOL;  Surgeon: Manya Silvas, MD;  Location: [facility] ENDOSCOPY;  Service: Endoscopy;  Laterality: N/A;  . ESOPHAGOGASTRODUODENOSCOPY (EGD) WITH PROPOFOL N/A 07/09/2015   Procedure: ESOPHAGOGASTRODUODENOSCOPY (EGD) WITH PROPOFOL;  Surgeon: Manya Silvas, MD;  Location: [facility] ENDOSCOPY;  Service: Endoscopy;  Laterality: N/A;  . OVARY SURGERY    . TRACHEOSTOMY  1959  . TUBAL LIGATION     Family History  Problem Relation Age of Onset  . Heart disease Father        MI  . Heart disease Mother   . Valvular heart disease Mother   . Breast cancer Sister 81  . Cancer Sister        Lung cancer  . COPD Sister   . Heart disease Sister   . Colon cancer Neg Hx    Social History   Socioeconomic History  . Marital status: Widowed    Spouse name: Not on file  . Number of children: 4  . Years of education: Not on file  .  Highest education level: Not on file  Occupational History  . Occupation: retired  Scientific laboratory technician  . Financial resource strain: Not on file  . Food insecurity:    Worry: Not on file    Inability: Not on file  . Transportation needs:    Medical: Not on file    Non-medical: Not on file  Tobacco Use  . Smoking status: Never Smoker  . Smokeless tobacco: Never Used  Substance and Sexual Activity  . Alcohol use: No    Alcohol/week: 0.0 standard drinks  . Drug use: No  . Sexual activity: Never  Lifestyle  . Physical activity:    Days per week: Not on file    Minutes per session: Not on file  . Stress: Not on file  Relationships  . Social connections:    Talks on phone: Not on file    Gets together: Not on  file    Attends religious service: Not on file    Active member of club or organization: Not on file    Attends meetings of clubs or organizations: Not on file    Relationship status: Not on file  Other Topics Concern  . Not on file  Social History Narrative   Lives with family at home    Outpatient Encounter Medications as of 08/03/2018  Medication Sig  . acetaminophen (TYLENOL) 325 MG tablet Take 2 tablets (650 mg total) by mouth every 6 (six) hours as needed for mild pain (or Fever >/= 101).  Marland Kitchen acidophilus (RISAQUAD) CAPS capsule Take 1 capsule by mouth daily.  Marland Kitchen albuterol (PROAIR HFA) 108 (90 Base) MCG/ACT inhaler USE 2 INHALATIONS EVERY 6 HOURS AS NEEDED FOR WHEEZING OR SHORTNESS OF BREATH  . alendronate (FOSAMAX) 70 MG tablet Take 70 mg by mouth once a week.   . ALPRAZolam (XANAX) 0.25 MG tablet Take 1 tablet (0.25 mg total) by mouth daily as needed for anxiety.  Marland Kitchen aspirin EC 81 MG tablet Take 81 mg by mouth daily.  . busPIRone (BUSPAR) 5 MG tablet Take 1 tablet (5 mg total) by mouth at bedtime as needed.  . Calcium Carbonate-Vitamin D (CALCIUM 600+D) 600-400 MG-UNIT tablet Take 1 tablet by mouth 2 (two) times daily.   . cetirizine (ZYRTEC) 10 MG tablet TAKE 1 TABLET DAILY  . cholecalciferol (VITAMIN D) 1000 units tablet Take 1,000 Units by mouth daily.  . colestipol (COLESTID) 1 g tablet Take 2 g by mouth daily.   Marland Kitchen esomeprazole (NEXIUM) 20 MG capsule Take 20 mg by mouth daily at 12 noon.  Marland Kitchen FeFum-FePo-FA-B Cmp-C-Zn-Mn-Cu (TANDEM PLUS) 162-115.2-1 MG CAPS Take 1 capsule by mouth daily.  . fluticasone (FLONASE) 50 MCG/ACT nasal spray Place 2 sprays into both nostrils daily.  . hydroxychloroquine (PLAQUENIL) 200 MG tablet Take 200 mg by mouth 2 (two) times daily.   . hydrOXYzine (ATARAX/VISTARIL) 25 MG tablet Take 1 tablet (25 mg total) by mouth daily as needed. If you take at night do not take with xanax  . lip balm (BLISTEX) OINT Apply 1 application topically as needed for lip  care.  . meclizine (ANTIVERT) 25 MG tablet take 1/2 to 1 tablet every 8 hours if needed for VERTIGO  . Multiple Vitamin (MULTIVITAMIN WITH MINERALS) TABS tablet Take 1 tablet by mouth daily.  . ondansetron (ZOFRAN ODT) 4 MG disintegrating tablet Take 1 tablet (4 mg total) by mouth every 8 (eight) hours as needed for nausea or vomiting.  . potassium chloride (KLOR-CON SPRINKLE) 10 MEQ CR capsule TAKE 2  CAPSULES Once a day  . predniSONE (DELTASONE) 5 MG tablet Take 1 tablet (5 mg total) by mouth daily with breakfast.  . simvastatin (ZOCOR) 20 MG tablet TAKE 1 TABLET DAILY  . sodium chloride (OCEAN) 0.65 % nasal spray Place 1 spray into the nose as needed for congestion.  . traMADol (ULTRAM) 50 MG tablet Take 50 mg by mouth 2 (two) times daily as needed for moderate pain.  Marland Kitchen triamcinolone cream (KENALOG) 0.1 % Apply 1 application topically 2 (two) times daily. Prn  . guaiFENesin-dextromethorphan (ROBITUSSIN DM) 100-10 MG/5ML syrup Take 5 mLs by mouth every 4 (four) hours as needed for cough. (Patient not taking: Reported on 08/03/2018)   No facility-administered encounter medications on file as of 08/03/2018.     Activities of Daily Living In your present state of health, do you have any difficulty performing the following activities: 08/03/2018  Hearing? N  Vision? N  Difficulty concentrating or making decisions? N  Walking or climbing stairs? N  Dressing or bathing? N  Doing errands, shopping? N  Preparing Food and eating ? N  Using the Toilet? N  In the past six months, have you accidently leaked urine? N  Do you have problems with loss of bowel control? N  Managing your Medications? N  Managing your Finances? N  Housekeeping or managing your Housekeeping? N  Some recent data might be hidden    Patient Care Team: [name], MD as PCP - General (Internal Medicine)    Assessment:   This is a routine wellness examination for Kaitlyn Huff.  Potassium recheck complete today.   No  change in weight. Appetite increasing slowly.   Health Screenings  Mammogram -07/13/18 Colonoscopy -07/09/15 Glaucoma -none reported Hearing -demonstrates normal hearing during conversation Vit B12- 07/12/18 (535) Cholesterol -07/12/18 (141)  Social  Alcohol intake -no Smoking history- no Smokers in home? no Illicit drug use? no Exercise walking as tolerated Diet -low cholesterol  Sexually Active -never  Safety  Patient feels safe at home.  Patient does have smoke detectors at home  Patient does wear sunscreen or protective clothing when in direct sunlight  Patient does wear seat belt when driving or riding with others.   Activities of Daily Living Patient can do their own household chores. Denies needing assistance with: driving, feeding themselves, getting from bed to chair, getting to the toilet, bathing/showering, dressing, managing money, climbing flight of stairs, or preparing meals.   Depression Screen Patient denies losing interest in daily life, feeling hopeless, or crying easily over simple problems.   Fall Screen Patient denies being afraid of falling or falling in the last year.   Memory Screen Patient denies problems with memory, misplacing items, and is able to balance checkbook/bank accounts.  Patient is alert, normal appearance, oriented to person/place/and time. Correctly identified the president of the [place], recall of 2/3 objects, and performing simple calculations.  Patient displays appropriate judgement and can read correct time from watch face.   Immunizations The following Immunizations are up to date: Influenza, pneumonia, and tetanus.  Shingles discussed.   Other Providers Patient Care Team: [name], MD as PCP - General (Internal Medicine)  Exercise Activities and Dietary recommendations Current Exercise Habits: Home exercise routine, Type of exercise: walking, Intensity: Mild  Goals      Patient Stated   . Healthy Lifestyle (pt-stated)       Stay active Eat when hungry       Fall Risk Fall Risk  08/03/2018 12/21/2017 07/14/2017 06/06/2016 05/12/2016  Falls in the past year? 0 No No No No  Number falls in past yr: - - - - -  Injury with Fall? - - - - -  Risk for fall due to : - - - - -  Risk for fall due to: Comment - - - - -   Depression Screen PHQ 2/9 Scores 08/03/2018 12/21/2017 07/14/2017 06/06/2016  PHQ - 2 Score 0 0 0 0  PHQ- 9 Score - - - -     Cognitive Function MMSE - Mini Mental State Exam 05/12/2016  Orientation to time 5  Orientation to Place 5  Registration 3  Attention/ Calculation 5  Recall 3  Language- name 2 objects 2  Language- repeat 1  Language- follow 3 step command 3  Language- read & follow direction 1  Write a sentence 1  Copy design 1  Total score 30     6CIT Screen 08/03/2018 07/14/2017  What Year? 0 points 0 points  What month? 0 points 0 points  What time? 0 points 0 points  Count back from 20 0 points 0 points  Months in reverse 0 points 0 points  Repeat phrase 0 points 0 points  Total Score 0 0    Immunization History  Administered Date(s) Administered  . Influenza Split 05/17/2012, 03/03/2014  . Influenza, High Dose Seasonal PF 02/25/2017, 03/29/2018  . Influenza,inj,Quad PF,6+ Mos 03/25/2013, 08/05/2015  . Influenza-Unspecified 03/25/2013  . Pneumococcal Conjugate-13 05/21/2018  . Pneumococcal Polysaccharide-23 02/24/2017   Screening Tests Health Maintenance  Topic Date Due  . TETANUS/TDAP  01/20/2019  . COLONOSCOPY  07/08/2020  . INFLUENZA VACCINE  Completed  . DEXA SCAN  Completed  . PNA vac Low Risk Adult  Completed      Plan:    End of life planning; Advance aging; Advanced directives discussed. Copy of current HCPOA/Living Will requested.    I have personally reviewed and noted the following in the patient's chart:   . Medical and social history . Use of alcohol, tobacco or illicit drugs  . Current medications and supplements . Functional ability and  status . Nutritional status . Physical activity . Advanced directives . List of other physicians . Hospitalizations, surgeries, and ER visits in previous 12 months . Vitals . Screenings to include cognitive, depression, and falls . Referrals and appointments  In addition, I have reviewed and discussed with patient certain preventive protocols, quality metrics, and best practice recommendations. A written personalized care plan for preventive services as well as general preventive health recommendations were provided to patient.     Varney Biles, LPN  8/41/3244   Reviewed above information.  Agree with assessment and plan.   Dr Nicki Reaper

## 2018-08-16 ENCOUNTER — Other Ambulatory Visit: Payer: Medicare Other

## 2018-08-17 ENCOUNTER — Other Ambulatory Visit (INDEPENDENT_AMBULATORY_CARE_PROVIDER_SITE_OTHER): Payer: Medicare Other

## 2018-08-17 DIAGNOSIS — I10 Essential (primary) hypertension: Secondary | ICD-10-CM | POA: Diagnosis not present

## 2018-08-17 LAB — BASIC METABOLIC PANEL
BUN: 10 mg/dL (ref 6–23)
CO2: 29 mEq/L (ref 19–32)
Calcium: 9.4 mg/dL (ref 8.4–10.5)
Chloride: 98 mEq/L (ref 96–112)
Creatinine, Ser: 0.98 mg/dL (ref 0.40–1.20)
GFR: 55.28 mL/min — ABNORMAL LOW (ref >60.00)
Glucose, Bld: 91 mg/dL (ref 70–99)
Potassium: 4.1 mEq/L (ref 3.5–5.1)
Sodium: 136 mEq/L (ref 135–145)

## 2018-08-27 DIAGNOSIS — M0579 Rheumatoid arthritis with rheumatoid factor of multiple sites without organ or systems involvement: Secondary | ICD-10-CM | POA: Diagnosis not present

## 2018-08-27 DIAGNOSIS — M05731 Rheumatoid arthritis with rheumatoid factor of right wrist without organ or systems involvement: Secondary | ICD-10-CM | POA: Diagnosis not present

## 2018-08-27 DIAGNOSIS — M05732 Rheumatoid arthritis with rheumatoid factor of left wrist without organ or systems involvement: Secondary | ICD-10-CM | POA: Diagnosis not present

## 2018-09-06 ENCOUNTER — Encounter: Payer: Self-pay | Admitting: Internal Medicine

## 2018-09-06 ENCOUNTER — Ambulatory Visit (INDEPENDENT_AMBULATORY_CARE_PROVIDER_SITE_OTHER): Payer: Medicare Other | Admitting: Internal Medicine

## 2018-09-06 DIAGNOSIS — K219 Gastro-esophageal reflux disease without esophagitis: Secondary | ICD-10-CM

## 2018-09-06 DIAGNOSIS — R634 Abnormal weight loss: Secondary | ICD-10-CM | POA: Diagnosis not present

## 2018-09-06 DIAGNOSIS — G479 Sleep disorder, unspecified: Secondary | ICD-10-CM | POA: Diagnosis not present

## 2018-09-06 DIAGNOSIS — F419 Anxiety disorder, unspecified: Secondary | ICD-10-CM

## 2018-09-06 DIAGNOSIS — I4891 Unspecified atrial fibrillation: Secondary | ICD-10-CM

## 2018-09-06 DIAGNOSIS — D649 Anemia, unspecified: Secondary | ICD-10-CM | POA: Diagnosis not present

## 2018-09-06 DIAGNOSIS — M069 Rheumatoid arthritis, unspecified: Secondary | ICD-10-CM

## 2018-09-06 DIAGNOSIS — G4733 Obstructive sleep apnea (adult) (pediatric): Secondary | ICD-10-CM | POA: Diagnosis not present

## 2018-09-06 DIAGNOSIS — E7800 Pure hypercholesterolemia, unspecified: Secondary | ICD-10-CM

## 2018-09-06 DIAGNOSIS — I10 Essential (primary) hypertension: Secondary | ICD-10-CM | POA: Diagnosis not present

## 2018-09-06 DIAGNOSIS — K760 Fatty (change of) liver, not elsewhere classified: Secondary | ICD-10-CM | POA: Diagnosis not present

## 2018-09-06 DIAGNOSIS — K838 Other specified diseases of biliary tract: Secondary | ICD-10-CM | POA: Diagnosis not present

## 2018-09-06 MED ORDER — ALPRAZOLAM 0.25 MG PO TABS: 0.2500 mg | ORAL_TABLET | Freq: Every day | ORAL | 1 refills | Status: DC | PRN

## 2018-09-06 MED ORDER — ESOMEPRAZOLE MAGNESIUM 20 MG PO CPDR: 20.0000 mg | DELAYED_RELEASE_CAPSULE | Freq: Every day | ORAL | 0 refills | Status: DC

## 2018-09-06 MED ORDER — FLUTICASONE PROPIONATE 50 MCG/ACT NA SUSP: 2.0000 | Freq: Every day | NASAL | 1 refills | Status: DC

## 2018-09-06 NOTE — Progress Notes (Signed)
Patient ID: Kaitlyn Huff, female   DOB: 1941-03-31, 78 y.o.   MRN: [id number]   Subjective:    Patient ID: Kaitlyn Huff, female    DOB: 08/03/1940, 78 y.o.   MRN: [id number]  HPI  Patient here for a scheduled follow up. She reports she feels some better.  Still with intermittent flares with her stomach.  Occasional nausea and vomiting.  No increased abdominal pain.  Some trouble with her bowels moving.  Did not tolerate miralax.  Discussed stool softener.  Still with weight loss.  Trying to eat.  No acid reflux.  Breathing stable.  No chest pain.  No increased cough or congestion.  Still with increased anxiety.  Not sleeping well.  Tries buspar.  Takes xanax.  Sometimes needs two qhs.  Have discussed trying to limit xanax use.  Discussed psychiatry referral given persistent problems with anxiety and sleep.     Past Medical History:  Diagnosis Date  . Anemia   . Cancer ([place])   . Diverticulitis   . GERD (gastroesophageal reflux disease)   . Hyperlipidemia   . Hypertension   . Osteoarthritis   . Osteoporosis    actonel  . Positive PPD    s/p INH (2006)  . Rheumatoid arthritis(714.0)    MTX transaminitis, Leflunomide (rash), enbrel, plaquinil, prednisone, remicade, Imuran (transaminitis)  . 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 ABDOMINAL HYSTERECTOMY    . CERVICAL CONE BIOPSY    . CHOLECYSTECTOMY  06/22/14  . COLONOSCOPY WITH PROPOFOL N/A 07/09/2015   Procedure: COLONOSCOPY WITH PROPOFOL;  Surgeon: Manya Silvas, MD;  Location: [facility] ENDOSCOPY;  Service: Endoscopy;  Laterality: N/A;  . ESOPHAGOGASTRODUODENOSCOPY (EGD) WITH PROPOFOL N/A 07/09/2015   Procedure: ESOPHAGOGASTRODUODENOSCOPY (EGD) WITH PROPOFOL;  Surgeon: Manya Silvas, MD;  Location: [facility] ENDOSCOPY;  Service: Endoscopy;  Laterality: N/A;  . OVARY SURGERY    . TRACHEOSTOMY  1959  . TUBAL LIGATION     Family History  Problem Relation Age of Onset    . Heart disease Father        MI  . Heart disease Mother   . Valvular heart disease Mother   . Breast cancer Sister 3  . Cancer Sister        Lung cancer  . COPD Sister   . Heart disease Sister   . Colon cancer Neg Hx    Social History   Socioeconomic History  . Marital status: Widowed    Spouse name: Not on file  . Number of children: 4  . Years of education: Not on file  . Highest education level: Not on file  Occupational History  . Occupation: retired  Scientific laboratory technician  . Financial resource strain: Not on file  . Food insecurity:    Worry: Not on file    Inability: Not on file  . Transportation needs:    Medical: Not on file    Non-medical: Not on file  Tobacco Use  . Smoking status: Never Smoker  . Smokeless tobacco: Never Used  Substance and Sexual Activity  . Alcohol use: No    Alcohol/week: 0.0 standard drinks  . Drug use: No  . Sexual activity: Never  Lifestyle  . Physical activity:    Days per week: Not on file    Minutes per session: Not on file  . Stress: Not on file  Relationships  . Social connections:    Talks  on phone: Not on file    Gets together: Not on file    Attends religious service: Not on file    Active member of club or organization: Not on file    Attends meetings of clubs or organizations: Not on file    Relationship status: Not on file  Other Topics Concern  . Not on file  Social History Narrative   Lives with family at home    Outpatient Encounter Medications as of 09/06/2018  Medication Sig  . acetaminophen (TYLENOL) 325 MG tablet Take 2 tablets (650 mg total) by mouth every 6 (six) hours as needed for mild pain (or Fever >/= 101).  Marland Kitchen acidophilus (RISAQUAD) CAPS capsule Take 1 capsule by mouth daily.  Marland Kitchen albuterol (PROAIR HFA) 108 (90 Base) MCG/ACT inhaler USE 2 INHALATIONS EVERY 6 HOURS AS NEEDED FOR WHEEZING OR SHORTNESS OF BREATH  . alendronate (FOSAMAX) 70 MG tablet Take 70 mg by mouth once a week.   . ALPRAZolam (XANAX)  0.25 MG tablet Take 1 tablet (0.25 mg total) by mouth daily as needed for anxiety.  Marland Kitchen aspirin EC 81 MG tablet Take 81 mg by mouth daily.  . busPIRone (BUSPAR) 5 MG tablet Take 1 tablet (5 mg total) by mouth at bedtime as needed.  . Calcium Carbonate-Vitamin D (CALCIUM 600+D) 600-400 MG-UNIT tablet Take 1 tablet by mouth 2 (two) times daily.   . cetirizine (ZYRTEC) 10 MG tablet TAKE 1 TABLET DAILY  . cholecalciferol (VITAMIN D) 1000 units tablet Take 1,000 Units by mouth daily.  . colestipol (COLESTID) 1 g tablet Take 2 g by mouth daily.   Marland Kitchen esomeprazole (NEXIUM) 20 MG capsule Take 1 capsule (20 mg total) by mouth daily at 12 noon.  Marland Kitchen FeFum-FePo-FA-B Cmp-C-Zn-Mn-Cu (TANDEM PLUS) 162-115.2-1 MG CAPS Take 1 capsule by mouth daily.  . fluticasone (FLONASE) 50 MCG/ACT nasal spray Place 2 sprays into both nostrils daily.  Marland Kitchen guaiFENesin-dextromethorphan (ROBITUSSIN DM) 100-10 MG/5ML syrup Take 5 mLs by mouth every 4 (four) hours as needed for cough. (Patient not taking: Reported on 08/03/2018)  . hydroxychloroquine (PLAQUENIL) 200 MG tablet Take 200 mg by mouth 2 (two) times daily.   . hydrOXYzine (ATARAX/VISTARIL) 25 MG tablet Take 1 tablet (25 mg total) by mouth daily as needed. If you take at night do not take with xanax  . lip balm (BLISTEX) OINT Apply 1 application topically as needed for lip care.  . meclizine (ANTIVERT) 25 MG tablet take 1/2 to 1 tablet every 8 hours if needed for VERTIGO  . Multiple Vitamin (MULTIVITAMIN WITH MINERALS) TABS tablet Take 1 tablet by mouth daily.  . ondansetron (ZOFRAN ODT) 4 MG disintegrating tablet Take 1 tablet (4 mg total) by mouth every 8 (eight) hours as needed for nausea or vomiting.  . potassium chloride (KLOR-CON SPRINKLE) 10 MEQ CR capsule TAKE 2 CAPSULES Once a day  . predniSONE (DELTASONE) 5 MG tablet Take 1 tablet (5 mg total) by mouth daily with breakfast.  . simvastatin (ZOCOR) 20 MG tablet TAKE 1 TABLET DAILY  . sodium chloride (OCEAN) 0.65 % nasal  spray Place 1 spray into the nose as needed for congestion.  . traMADol (ULTRAM) 50 MG tablet Take 50 mg by mouth 2 (two) times daily as needed for moderate pain.  Marland Kitchen triamcinolone cream (KENALOG) 0.1 % Apply 1 application topically 2 (two) times daily. Prn  . [DISCONTINUED] ALPRAZolam (XANAX) 0.25 MG tablet Take 1 tablet (0.25 mg total) by mouth daily as needed for anxiety.  . [  DISCONTINUED] esomeprazole (NEXIUM) 20 MG capsule Take 20 mg by mouth daily at 12 noon.  . [DISCONTINUED] fluticasone (FLONASE) 50 MCG/ACT nasal spray Place 2 sprays into both nostrils daily.   No facility-administered encounter medications on file as of 09/06/2018.     Review of Systems  Constitutional:       Trying to eat better.  Still with weight loss.    HENT: Negative for congestion and sinus pressure.   Respiratory: Negative for cough, chest tightness and shortness of breath.   Cardiovascular: Negative for chest pain, palpitations and leg swelling.  Gastrointestinal: Positive for constipation.       Still some intermittent flares with her stomach as outlined.    Genitourinary: Negative for difficulty urinating and dysuria.  Musculoskeletal: Negative for myalgias.       Joints stable.  Seeing Dr [name].    Skin: Negative for color change and rash.  Neurological: Negative for dizziness, light-headedness and headaches.  Psychiatric/Behavioral: Positive for sleep disturbance.       Some increased anxiety as outlined.         Objective:    Physical Exam Constitutional:      General: She is not in acute distress.    Appearance: Normal appearance.  HENT:     Nose: Nose normal. No congestion.     Mouth/Throat:     Pharynx: No oropharyngeal exudate or posterior oropharyngeal erythema.  Neck:     Musculoskeletal: Neck supple. No muscular tenderness.     Thyroid: No thyromegaly.  Cardiovascular:     Rate and Rhythm: Normal rate and regular rhythm.  Pulmonary:     Effort: No respiratory distress.      Breath sounds: Normal breath sounds. No wheezing.  Abdominal:     General: Bowel sounds are normal.     Palpations: Abdomen is soft.     Tenderness: There is no abdominal tenderness.  Musculoskeletal:        General: No swelling or tenderness.  Lymphadenopathy:     Cervical: No cervical adenopathy.  Skin:    Findings: No erythema or rash.  Neurological:     Mental Status: She is alert.  Psychiatric:        Mood and Affect: Mood normal.        Behavior: Behavior normal.     BP 132/72   Pulse 84   Temp 97.8 F (36.6 C) (Oral)   Resp 16   Wt 131 lb 3.2 oz (59.5 kg)   SpO2 97%   BMI 22.52 kg/m  Wt Readings from Last 3 Encounters:  09/06/18 131 lb 3.2 oz (59.5 kg)  08/03/18 134 lb 12.8 oz (61.1 kg)  07/12/18 134 lb 12.8 oz (61.1 kg)     Lab Results  Component Value Date   WBC 7.5 07/12/2018   HGB 12.7 07/12/2018   HCT 37.5 07/12/2018   PLT 200.0 07/12/2018   GLUCOSE 91 08/17/2018   CHOL 141 07/12/2018   TRIG 80.0 07/12/2018   HDL 81.80 07/12/2018   LDLDIRECT 73.7 08/20/2013   LDLCALC 43 07/12/2018   ALT 32 07/12/2018   AST 48 (H) 07/12/2018   NA 136 08/17/2018   K 4.1 08/17/2018   CL 98 08/17/2018   CREATININE 0.98 08/17/2018   BUN 10 08/17/2018   CO2 29 08/17/2018   TSH 3.15 05/21/2018   INR 1.28 10/31/2016    US Abdomen Complete  Result Date: 07/27/2018 CLINICAL DATA:  Hepatic steatosis EXAM: ABDOMEN ULTRASOUND COMPLETE COMPARISON:  06/24/2017 FINDINGS:  Gallbladder: Surgically removed Common bile duct: Diameter: 9.3 mm consistent with the post cholecystectomy state. Liver: Diffuse increased echogenicity is noted consistent with fatty infiltration. No focal mass is seen. Portal vein is patent on color Doppler imaging with normal direction of blood flow towards the liver. IVC: No abnormality visualized. Pancreas: Visualized portion unremarkable. Spleen: Size and appearance within normal limits. Right Kidney: Length: 10.9 cm. Echogenicity within normal limits.  No mass or hydronephrosis visualized. Left Kidney: Length: 11.4 cm. Echogenicity within normal limits. No mass or hydronephrosis visualized. Abdominal aorta: No aneurysm visualized. Other findings: None. IMPRESSION: Status post cholecystectomy. Fatty infiltration of the liver without acute abnormality. Electronically Signed   By: Inez Catalina M.D.   On: 07/27/2018 14:00       Assessment & Plan:   Problem List Items Addressed This Visit    A-fib ([place])    Appears to be in SR.  Off amiodarone.  Feels better off amiodarone.  Followed by cardiology.  Last evaluated 05/2018.  Stable.        Anemia    Follow cbc and ferritin.       Anxiety    Increased anxiety and persistent sleep issues.  Does not sleep well.  Taking xanax.  Discussed need to limit amount of xanax taking.  Discussed referral to psychiatry given persistent problems.  She is agreeable.        Relevant Medications   ALPRAZolam (XANAX) 0.25 MG tablet   Other Relevant Orders   Ambulatory referral to Psychiatry   Difficulty sleeping    Persistent problem.  Refer to psychiatry as outlined.        Relevant Orders   Ambulatory referral to Psychiatry   Dilated bile duct    Persistent intermittent flares with her stomach as outlined.  Have discussed CT.  GI recently ordered ultrasound.  Has f/u with GI in 09/2018.  Keep f/u.  Further w/up per their assessment.        Fatty liver    Follow liver function tests.  Seeing GI.        GERD (gastroesophageal reflux disease)    On nexium.  Appears to be controlled.        Relevant Medications   esomeprazole (NEXIUM) 20 MG capsule   Hypercholesterolemia    On simvastatin.  Low cholesterol diet and exercise.  Follow lipid panel and liver function tests.        Hypertension    Blood pressure under good control.  Continue same medication regimen.  Follow pressures.  Follow metabolic panel.        Obstructive sleep apnea    Declines treatment.       Rheumatoid arthritis ([place])     Followed by Dr [name].  Stable.        Weight loss    Persistent weight loss.  She is trying to eat better.  Planning to f/u with GI in 09/2018.  Hold on scanning.  Further w/up pending their assessment.            [name], MD

## 2018-09-08 ENCOUNTER — Encounter: Payer: Self-pay | Admitting: Internal Medicine

## 2018-09-08 NOTE — Assessment & Plan Note (Signed)
Increased anxiety and persistent sleep issues.  Does not sleep well.  Taking xanax.  Discussed need to limit amount of xanax taking.  Discussed referral to psychiatry given persistent problems.  She is agreeable.

## 2018-09-08 NOTE — Assessment & Plan Note (Signed)
On nexium.  Appears to be controlled.

## 2018-09-08 NOTE — Assessment & Plan Note (Signed)
Blood pressure under good control.  Continue same medication regimen.  Follow pressures.  Follow metabolic panel.   

## 2018-09-08 NOTE — Assessment & Plan Note (Signed)
Follow liver function tests.  Seeing GI.

## 2018-09-08 NOTE — Assessment & Plan Note (Signed)
On simvastatin.  Low cholesterol diet and exercise.  Follow lipid panel and liver function tests.   

## 2018-09-08 NOTE — Assessment & Plan Note (Signed)
Followed by Dr Kernodle.  Stable.  

## 2018-09-08 NOTE — Assessment & Plan Note (Signed)
Persistent intermittent flares with her stomach as outlined.  Have discussed CT.  GI recently ordered ultrasound.  Has f/u with GI in 09/2018.  Keep f/u.  Further w/up per their assessment.

## 2018-09-08 NOTE — Assessment & Plan Note (Signed)
Persistent problem.  Refer to psychiatry as outlined.

## 2018-09-08 NOTE — Assessment & Plan Note (Signed)
Declines treatment

## 2018-09-08 NOTE — Assessment & Plan Note (Signed)
Persistent weight loss.  She is trying to eat better.  Planning to f/u with GI in 09/2018.  Hold on scanning.  Further w/up pending their assessment.

## 2018-09-08 NOTE — Assessment & Plan Note (Signed)
Appears to be in SR.  Off amiodarone.  Feels better off amiodarone.  Followed by cardiology.  Last evaluated 05/2018.  Stable.

## 2018-09-08 NOTE — Assessment & Plan Note (Signed)
Follow cbc and ferritin.  

## 2018-09-11 ENCOUNTER — Telehealth: Payer: Self-pay | Admitting: Internal Medicine

## 2018-09-11 NOTE — Telephone Encounter (Signed)
Copied from [place] [phone]. Topic: Quick Communication - See Telephone Encounter >> Sep 11, 2018  3:39 PM Valla Leaver wrote: CRM for notification. See Telephone encounter for: 09/11/18. Patient says PA is needed on esomeprazole (NEXIUM) 20 MG capsule. Call [phone] to initiate PA. Patient would like a call to notify when this is initiated.

## 2018-09-13 NOTE — Telephone Encounter (Signed)
Pt is aware.  

## 2018-09-13 NOTE — Telephone Encounter (Signed)
° ° °  Pt return your call and said she prefer to stay on [place] it is what works for her and she has told the insurance company that she tried those meds before

## 2018-09-13 NOTE — Telephone Encounter (Signed)
Signed form and placed gave to you.  Let me know if we need to change.  If need to change, can switch to protonix 40mg  q day.

## 2018-09-13 NOTE — Telephone Encounter (Signed)
LMTCB. Insurance is wanting to switch her to omeprazole or pantoprazole. Attempting PA for generic nexium. If pt is agreeable, are you okay with switching to one of these?

## 2018-09-13 NOTE — Telephone Encounter (Signed)
PA submitted for Nexium. Awaiting response

## 2018-10-22 DIAGNOSIS — M0579 Rheumatoid arthritis with rheumatoid factor of multiple sites without organ or systems involvement: Secondary | ICD-10-CM | POA: Diagnosis not present

## 2018-10-22 DIAGNOSIS — M8588 Other specified disorders of bone density and structure, other site: Secondary | ICD-10-CM | POA: Diagnosis not present

## 2018-11-14 ENCOUNTER — Telehealth: Payer: Self-pay | Admitting: Internal Medicine

## 2018-11-14 ENCOUNTER — Telehealth: Payer: Self-pay

## 2018-11-14 MED ORDER — ALPRAZOLAM 0.25 MG PO TABS: 0.2500 mg | ORAL_TABLET | Freq: Every day | ORAL | 1 refills | Status: DC | PRN

## 2018-11-14 NOTE — Telephone Encounter (Signed)
Per last visit, she was being referred to psychiatry.  Have they scheduled appt?  If has appt, I can refill until she is seen by psych.

## 2018-11-14 NOTE — Telephone Encounter (Signed)
Kaitlyn Huff from Dr. Waylan Boga office called back. She stated that she made a mistake and that she has been scheduled to see Dr. Nicolasa Ducking on July 1.

## 2018-11-14 NOTE — Telephone Encounter (Signed)
rx ok'd for alprazolam #30 with 1 refill.

## 2018-11-14 NOTE — Telephone Encounter (Signed)
Attempted to reach patient. Left message to call back. Spoke with Dr. Waylan Boga office regarding referral. Per their records, pt was approved and contacted to schedule new pt appt. Pt did not return calls to them. They are going to reach out to her again and they have given me the number she needs to call to set up new pt appt as well.

## 2018-11-14 NOTE — Telephone Encounter (Signed)
Copied from [place] [phone]. Topic: Quick Communication - Rx Refill/Question >> Nov 14, 2018 10:45 AM Reyne Dumas L wrote: Medication: ALPRAZolam [name]) 0.25 MG tablet  Has the patient contacted their pharmacy? yes - states pharmacy sent it in and they haven't heard back yet (Agent: If no, request that the patient contact the pharmacy for the refill.) (Agent: If yes, when and what did the pharmacy advise?)  Preferred Pharmacy (with phone number or street name): [place], [place] - [address] [facility] [phone] (Phone) [phone] (Fax)  Agent: Please be advised that RX refills may take up to 3 business days. We ask that you follow-up with your pharmacy.

## 2018-11-15 NOTE — Telephone Encounter (Signed)
Pt aware Rx called into pharmacy

## 2018-11-22 ENCOUNTER — Other Ambulatory Visit: Payer: Self-pay | Admitting: Internal Medicine

## 2018-12-17 ENCOUNTER — Other Ambulatory Visit: Payer: Self-pay | Admitting: Internal Medicine

## 2018-12-21 NOTE — Telephone Encounter (Signed)
Refilled: 11/14/2018 Last OV: 09/06/2018 Next OV: 01/14/2019

## 2018-12-22 NOTE — Telephone Encounter (Signed)
Reviewed prescription history.  Prescription for xanax was sent in on 11/14/18 for #30 with one refill.  This should get her through until beginning of July.  Per phone note, she is scheduled to see Dr Nicolasa Ducking 01/09/19.  She should take over prescribing medication after this appt.

## 2018-12-25 DIAGNOSIS — M0579 Rheumatoid arthritis with rheumatoid factor of multiple sites without organ or systems involvement: Secondary | ICD-10-CM | POA: Diagnosis not present

## 2019-01-07 ENCOUNTER — Other Ambulatory Visit: Payer: Self-pay

## 2019-01-07 DIAGNOSIS — L508 Other urticaria: Secondary | ICD-10-CM

## 2019-01-07 DIAGNOSIS — S80862A Insect bite (nonvenomous), left lower leg, initial encounter: Secondary | ICD-10-CM

## 2019-01-07 MED ORDER — HYDROXYZINE HCL 25 MG PO TABS: 25.0000 mg | ORAL_TABLET | Freq: Every day | ORAL | 0 refills | Status: DC | PRN

## 2019-01-14 ENCOUNTER — Encounter: Payer: Medicare Other | Admitting: Internal Medicine

## 2019-01-17 ENCOUNTER — Telehealth: Payer: Self-pay | Admitting: Internal Medicine

## 2019-01-17 ENCOUNTER — Telehealth: Payer: Self-pay

## 2019-01-17 MED ORDER — ALPRAZOLAM 0.25 MG PO TABS: 0.2500 mg | ORAL_TABLET | Freq: Every day | ORAL | 0 refills | Status: DC | PRN

## 2019-01-17 NOTE — Telephone Encounter (Signed)
Copied from [place] [phone]. Topic: Appointment Scheduling - Scheduling Inquiry for Clinic >> Jan 17, 2019 11:07 AM Lennox Solders wrote: Reason for CRM: pt is calling and waiting to hear back concerning  physical she had on 01-14-2019

## 2019-01-17 NOTE — Telephone Encounter (Signed)
rx sent in for xanax #30 with no refills.   

## 2019-01-17 NOTE — Telephone Encounter (Signed)
Pt was supposed to have appt last week. We discussed. Pt had to reschedule appt with Dr Nicolasa Ducking until 7/21 due to [place] and is needing enough xanax to last until her appt with Dr. Nicolasa Ducking

## 2019-01-17 NOTE — Telephone Encounter (Signed)
Pt resch her appt with dr Nicolasa Ducking until 01/29/2019. Pt would like a refill on alprazolam. [facility] on [address] in [place]

## 2019-01-17 NOTE — Telephone Encounter (Signed)
Last OV:  09/06/18  Last Refill:  11/14/18

## 2019-01-18 NOTE — Telephone Encounter (Signed)
Pt aware that xanax has been sent

## 2019-01-18 NOTE — Telephone Encounter (Signed)
See other phone note

## 2019-01-29 ENCOUNTER — Telehealth: Payer: Self-pay | Admitting: Internal Medicine

## 2019-01-29 NOTE — Telephone Encounter (Signed)
Copied from [place] [phone]. Topic: Quick Communication - Rx Refill/Question >> Jan 29, 2019  1:23 PM Kaitlyn Huff wrote: Medication: ALPRAZolam [name]) 0.25 MG tablet Has the patient contacted their pharmacy? No. (Agent: If no, request that the patient contact the pharmacy for the refill.) (Agent: If yes, when and what did the pharmacy advise?)  Preferred Pharmacy (with phone number or street name):   Pt not due for refill yet, pt is wanting to make sure Dr Nicki Reaper will continue to fill pts xanax until September.  Dr Nicolasa Ducking moved the pts appt out until September due to Covid   Agent: Please be advised that RX refills may take up to 3 business days. We ask that you follow-up with your pharmacy.

## 2019-01-29 NOTE — Telephone Encounter (Incomplete)
Copied from [place] [phone]. Topic: Quick Communication - Rx Refill/Question >> Jan 29, 2019  1:23 PM Nils Flack wrote: Medication: ALPRAZolam [name]) 0.25 MG tablet Has the patient contacted their pharmacy? {yes [id number]} (Agent: If no, request that the patient contact the pharmacy for the refill.) (Agent: If yes, when and what did the pharmacy advise?)  Preferred Pharmacy (with phone number or street name):   Pt not due for refill yet, pt is wanting to make sure Dr Nicki Reaper will continue to fill pts xanax until September.  Dr Nicolasa Ducking moved the pts appt out until September due to Covid   Agent: Please be advised that RX refills may take up to 3 business days. We ask that you follow-up with your pharmacy.

## 2019-01-30 NOTE — Telephone Encounter (Signed)
Called Dr. Waylan Boga office to follow up about patient seeing them. Their office is closed for lunch. Will call back during office hours

## 2019-01-31 DIAGNOSIS — H43813 Vitreous degeneration, bilateral: Secondary | ICD-10-CM | POA: Diagnosis not present

## 2019-01-31 DIAGNOSIS — Z79899 Other long term (current) drug therapy: Secondary | ICD-10-CM | POA: Diagnosis not present

## 2019-02-01 NOTE — Telephone Encounter (Signed)
Called and spoke to Dr. Waylan Boga office. Patient had new pt appt on 7/1. Cancelled appt because she was not comfortable with doing a televisit. Pt was rescheduled to 9/16. Dr. Nicki Reaper has agreed to fill xanax until new pt appt but pt must keep the appt in September with Dr Nicolasa Ducking. Patient also needs to return paperwork back to Dr Waylan Boga office. Left message for patient to call back

## 2019-02-04 DIAGNOSIS — H35383 Toxic maculopathy, bilateral: Secondary | ICD-10-CM | POA: Diagnosis not present

## 2019-02-04 DIAGNOSIS — Z79899 Other long term (current) drug therapy: Secondary | ICD-10-CM | POA: Diagnosis not present

## 2019-02-04 DIAGNOSIS — H43813 Vitreous degeneration, bilateral: Secondary | ICD-10-CM | POA: Diagnosis not present

## 2019-02-12 DIAGNOSIS — H35383 Toxic maculopathy, bilateral: Secondary | ICD-10-CM | POA: Diagnosis not present

## 2019-02-12 DIAGNOSIS — Z79899 Other long term (current) drug therapy: Secondary | ICD-10-CM | POA: Diagnosis not present

## 2019-02-12 NOTE — Telephone Encounter (Signed)
I spoke with patient & she was aware that appointment needed to be kept for Dr. Nicolasa Ducking.

## 2019-02-18 ENCOUNTER — Other Ambulatory Visit: Payer: Self-pay | Admitting: Internal Medicine

## 2019-02-19 ENCOUNTER — Telehealth: Payer: Self-pay | Admitting: Internal Medicine

## 2019-02-19 ENCOUNTER — Telehealth: Payer: Self-pay

## 2019-02-19 DIAGNOSIS — J441 Chronic obstructive pulmonary disease with (acute) exacerbation: Secondary | ICD-10-CM | POA: Diagnosis not present

## 2019-02-19 DIAGNOSIS — I059 Rheumatic mitral valve disease, unspecified: Secondary | ICD-10-CM | POA: Diagnosis not present

## 2019-02-19 DIAGNOSIS — I38 Endocarditis, valve unspecified: Secondary | ICD-10-CM | POA: Diagnosis not present

## 2019-02-19 DIAGNOSIS — I10 Essential (primary) hypertension: Secondary | ICD-10-CM | POA: Diagnosis not present

## 2019-02-19 DIAGNOSIS — I480 Paroxysmal atrial fibrillation: Secondary | ICD-10-CM | POA: Diagnosis not present

## 2019-02-19 DIAGNOSIS — I479 Paroxysmal tachycardia, unspecified: Secondary | ICD-10-CM | POA: Diagnosis not present

## 2019-02-19 DIAGNOSIS — J9601 Acute respiratory failure with hypoxia: Secondary | ICD-10-CM | POA: Diagnosis not present

## 2019-02-19 DIAGNOSIS — M0579 Rheumatoid arthritis with rheumatoid factor of multiple sites without organ or systems involvement: Secondary | ICD-10-CM | POA: Diagnosis not present

## 2019-02-19 MED ORDER — FLUTICASONE PROPIONATE 50 MCG/ACT NA SUSP: 2.0000 | Freq: Every day | NASAL | 1 refills | Status: DC

## 2019-02-19 NOTE — Telephone Encounter (Signed)
Per last phone note with pt, her appt with Dr Nicolasa Ducking was rescheduled to 01/29/19.  This note is now stating appt is in September.  Is pt moving/changing appts?  Needs to keep appt with Dr Nicolasa Ducking.  Need more information

## 2019-02-19 NOTE — Telephone Encounter (Signed)
Flonase refilled.    Pt requesting refill on Xanax.  Last Refill on Xanax:  01/17/19  Last OV:  09/06/18

## 2019-02-19 NOTE — Telephone Encounter (Signed)
Copied from [place] [phone]. Topic: Quick Communication - See Telephone Encounter >> Feb 19, 2019 10:32 AM Loma Boston wrote: CRM for notification. See Telephone encounter for: 02/19/19.Pt is calling about Xanax says that [facility] appt is made for sept but needs this script to [facility] if possible     filled pls advise  [phone]   scripts  [place], [place] (Phone) [phone] (Fax) In addition: fluticasone (FLONASE) 50 MCG/ACT nasal spray Also needs 90 supply but  sent to express [place], [place] [phone] (Phone) [phone] (Fax)  Pt says that they do not have a script for the Xanax?

## 2019-02-19 NOTE — Telephone Encounter (Signed)
I spoke with you about this previously. Her appointment with Dr Nicolasa Ducking was originally in July. Pt cancelled because she did not want to do virtual. Patient was informed that she must keep appointment in September. There is a phone note in her chart regarding this previously

## 2019-02-19 NOTE — Telephone Encounter (Signed)
Pt is calling about Xanax says that [facility] appt is made for sept but needs this script to [facility] if possible     filled pls advise  [phone]   scripts  [place], [place] (Phone) [phone] (Fax)  In addition: fluticasone (FLONASE) 50 MCG/ACT nasal spray Also needs 90 supply but  sent to express [place], [place] [phone] (Phone) [phone] (Fax)   Pt says that they do not have a script for the Xanax?

## 2019-02-19 NOTE — Telephone Encounter (Signed)
Copied from [place] [phone]. Topic: Quick Communication - See Telephone Encounter >> Feb 19, 2019 10:32 AM Loma Boston wrote: CRM for notification. See Telephone encounter for: 02/19/19.Pt is calling about Xanax says that [facility] appt is made for sept but needs this script to [facility] if possible     filled pls advise  [phone]   scripts  [place], [place] (Phone) [phone] (Fax) In addition: fluticasone (FLONASE) 50 MCG/ACT nasal spray Also needs 90 supply but  sent to express [place], [place] [phone] (Phone) [phone] (Fax)  Pt says that they do not have a script for the Xanax?

## 2019-02-20 MED ORDER — ALPRAZOLAM 0.25 MG PO TABS: 0.2500 mg | ORAL_TABLET | Freq: Every day | ORAL | 0 refills | Status: DC | PRN

## 2019-02-20 NOTE — Telephone Encounter (Signed)
rx sent in for alprazolam #30 with no refills.   

## 2019-02-20 NOTE — Addendum Note (Signed)
Addended by: Alisa Graff on: 02/20/2019 05:07 AM   Modules accepted: Orders

## 2019-02-21 DIAGNOSIS — I059 Rheumatic mitral valve disease, unspecified: Secondary | ICD-10-CM | POA: Diagnosis not present

## 2019-02-21 DIAGNOSIS — I480 Paroxysmal atrial fibrillation: Secondary | ICD-10-CM | POA: Diagnosis not present

## 2019-02-21 DIAGNOSIS — J441 Chronic obstructive pulmonary disease with (acute) exacerbation: Secondary | ICD-10-CM | POA: Diagnosis not present

## 2019-02-21 DIAGNOSIS — I38 Endocarditis, valve unspecified: Secondary | ICD-10-CM | POA: Diagnosis not present

## 2019-02-21 DIAGNOSIS — I10 Essential (primary) hypertension: Secondary | ICD-10-CM | POA: Diagnosis not present

## 2019-02-21 DIAGNOSIS — G4733 Obstructive sleep apnea (adult) (pediatric): Secondary | ICD-10-CM | POA: Diagnosis not present

## 2019-02-27 ENCOUNTER — Emergency Department: Payer: Medicare Other

## 2019-02-27 ENCOUNTER — Other Ambulatory Visit: Payer: Self-pay

## 2019-02-27 ENCOUNTER — Inpatient Hospital Stay
Admit: 2019-02-27 | Discharge: 2019-02-27 | Disposition: A | Discharge status: Home / Self Care | Payer: Medicare Other | Attending: Family Medicine | Admitting: Family Medicine

## 2019-02-27 ENCOUNTER — Encounter: Payer: Self-pay | Admitting: *Deleted

## 2019-02-27 ENCOUNTER — Inpatient Hospital Stay
Admission: EM | Admit: 2019-02-27 | Discharge: 2019-03-03 | DRG: 310 | Disposition: A | Discharge status: Home Health | Payer: Medicare Other | Attending: Internal Medicine | Admitting: Internal Medicine

## 2019-02-27 DIAGNOSIS — M069 Rheumatoid arthritis, unspecified: Secondary | ICD-10-CM | POA: Diagnosis present

## 2019-02-27 DIAGNOSIS — M25461 Effusion, right knee: Secondary | ICD-10-CM | POA: Diagnosis not present

## 2019-02-27 DIAGNOSIS — Z7983 Long term (current) use of bisphosphonates: Secondary | ICD-10-CM

## 2019-02-27 DIAGNOSIS — M25462 Effusion, left knee: Secondary | ICD-10-CM

## 2019-02-27 DIAGNOSIS — R262 Difficulty in walking, not elsewhere classified: Secondary | ICD-10-CM | POA: Diagnosis present

## 2019-02-27 DIAGNOSIS — M25562 Pain in left knee: Secondary | ICD-10-CM | POA: Diagnosis present

## 2019-02-27 DIAGNOSIS — M810 Age-related osteoporosis without current pathological fracture: Secondary | ICD-10-CM | POA: Diagnosis present

## 2019-02-27 DIAGNOSIS — R509 Fever, unspecified: Secondary | ICD-10-CM | POA: Diagnosis not present

## 2019-02-27 DIAGNOSIS — K219 Gastro-esophageal reflux disease without esophagitis: Secondary | ICD-10-CM | POA: Diagnosis present

## 2019-02-27 DIAGNOSIS — Z8249 Family history of ischemic heart disease and other diseases of the circulatory system: Secondary | ICD-10-CM

## 2019-02-27 DIAGNOSIS — Z86010 Personal history of colonic polyps: Secondary | ICD-10-CM | POA: Diagnosis not present

## 2019-02-27 DIAGNOSIS — R55 Syncope and collapse: Secondary | ICD-10-CM | POA: Diagnosis present

## 2019-02-27 DIAGNOSIS — Z20828 Contact with and (suspected) exposure to other viral communicable diseases: Secondary | ICD-10-CM | POA: Diagnosis present

## 2019-02-27 DIAGNOSIS — Z79899 Other long term (current) drug therapy: Secondary | ICD-10-CM

## 2019-02-27 DIAGNOSIS — A419 Sepsis, unspecified organism: Secondary | ICD-10-CM

## 2019-02-27 DIAGNOSIS — Z882 Allergy status to sulfonamides status: Secondary | ICD-10-CM

## 2019-02-27 DIAGNOSIS — Z888 Allergy status to other drugs, medicaments and biological substances status: Secondary | ICD-10-CM

## 2019-02-27 DIAGNOSIS — M25561 Pain in right knee: Secondary | ICD-10-CM | POA: Diagnosis present

## 2019-02-27 DIAGNOSIS — I4891 Unspecified atrial fibrillation: Principal | ICD-10-CM | POA: Diagnosis present

## 2019-02-27 DIAGNOSIS — Z885 Allergy status to narcotic agent status: Secondary | ICD-10-CM

## 2019-02-27 DIAGNOSIS — E669 Obesity, unspecified: Secondary | ICD-10-CM | POA: Diagnosis present

## 2019-02-27 DIAGNOSIS — I4892 Unspecified atrial flutter: Secondary | ICD-10-CM | POA: Diagnosis present

## 2019-02-27 DIAGNOSIS — E785 Hyperlipidemia, unspecified: Secondary | ICD-10-CM | POA: Diagnosis present

## 2019-02-27 DIAGNOSIS — I10 Essential (primary) hypertension: Secondary | ICD-10-CM | POA: Diagnosis present

## 2019-02-27 DIAGNOSIS — Z6822 Body mass index (BMI) 22.0-22.9, adult: Secondary | ICD-10-CM

## 2019-02-27 DIAGNOSIS — D649 Anemia, unspecified: Secondary | ICD-10-CM | POA: Diagnosis present

## 2019-02-27 DIAGNOSIS — R0602 Shortness of breath: Secondary | ICD-10-CM | POA: Diagnosis not present

## 2019-02-27 DIAGNOSIS — E876 Hypokalemia: Secondary | ICD-10-CM | POA: Diagnosis present

## 2019-02-27 DIAGNOSIS — F419 Anxiety disorder, unspecified: Secondary | ICD-10-CM | POA: Diagnosis present

## 2019-02-27 DIAGNOSIS — Z7901 Long term (current) use of anticoagulants: Secondary | ICD-10-CM | POA: Diagnosis not present

## 2019-02-27 DIAGNOSIS — G4733 Obstructive sleep apnea (adult) (pediatric): Secondary | ICD-10-CM | POA: Diagnosis present

## 2019-02-27 DIAGNOSIS — Z66 Do not resuscitate: Secondary | ICD-10-CM | POA: Diagnosis present

## 2019-02-27 DIAGNOSIS — Z881 Allergy status to other antibiotic agents status: Secondary | ICD-10-CM

## 2019-02-27 DIAGNOSIS — Z7982 Long term (current) use of aspirin: Secondary | ICD-10-CM

## 2019-02-27 DIAGNOSIS — R008 Other abnormalities of heart beat: Secondary | ICD-10-CM | POA: Diagnosis not present

## 2019-02-27 DIAGNOSIS — S8992XA Unspecified injury of left lower leg, initial encounter: Secondary | ICD-10-CM | POA: Diagnosis not present

## 2019-02-27 DIAGNOSIS — Z7952 Long term (current) use of systemic steroids: Secondary | ICD-10-CM | POA: Diagnosis not present

## 2019-02-27 LAB — CBC WITH DIFFERENTIAL/PLATELET
Abs Immature Granulocytes: 0.03 10*3/uL (ref 0.00–0.07)
Basophils Absolute: 0 10*3/uL (ref 0.0–0.1)
Basophils Relative: 0 %
Eosinophils Absolute: 0.1 10*3/uL (ref 0.0–0.5)
Eosinophils Relative: 0 %
HCT: 32.9 % — ABNORMAL LOW (ref 36.0–46.0)
Hemoglobin: 11.2 g/dL — ABNORMAL LOW (ref 12.0–15.0)
Immature Granulocytes: 0 %
Lymphocytes Relative: 14 %
Lymphs Abs: 1.6 10*3/uL (ref 0.7–4.0)
MCH: 30.9 pg (ref 26.0–34.0)
MCHC: 34 g/dL (ref 30.0–36.0)
MCV: 90.9 fL (ref 80.0–100.0)
Monocytes Absolute: 1.4 10*3/uL — ABNORMAL HIGH (ref 0.1–1.0)
Monocytes Relative: 12 %
Neutro Abs: 8.2 10*3/uL — ABNORMAL HIGH (ref 1.7–7.7)
Neutrophils Relative %: 74 %
Platelets: 230 10*3/uL (ref 150–400)
RBC: 3.62 MIL/uL — ABNORMAL LOW (ref 3.87–5.11)
RDW: 12.3 % (ref 11.5–15.5)
WBC: 11.3 10*3/uL — ABNORMAL HIGH (ref 4.0–10.5)
nRBC: 0 % (ref 0.0–0.2)

## 2019-02-27 LAB — TROPONIN I (HIGH SENSITIVITY)
Troponin I (High Sensitivity): 122 ng/L (ref <18)
Troponin I (High Sensitivity): 116 ng/L (ref <18)

## 2019-02-27 LAB — URINALYSIS, ROUTINE W REFLEX MICROSCOPIC
Bilirubin Urine: NEGATIVE
Glucose, UA: NEGATIVE mg/dL
Hgb urine dipstick: NEGATIVE
Ketones, ur: NEGATIVE mg/dL
Leukocytes,Ua: NEGATIVE
Nitrite: NEGATIVE
Protein, ur: NEGATIVE mg/dL
Specific Gravity, Urine: 1.009 (ref 1.005–1.030)
pH: 8 (ref 5.0–8.0)

## 2019-02-27 LAB — COMPREHENSIVE METABOLIC PANEL
ALT: 33 U/L (ref 0–44)
AST: 42 U/L — ABNORMAL HIGH (ref 15–41)
Albumin: 3.9 g/dL (ref 3.5–5.0)
Alkaline Phosphatase: 45 U/L (ref 38–126)
Anion gap: 7 (ref 5–15)
BUN: 14 mg/dL (ref 8–23)
CO2: 28 mmol/L (ref 22–32)
Calcium: 8.7 mg/dL — ABNORMAL LOW (ref 8.9–10.3)
Chloride: 97 mmol/L — ABNORMAL LOW (ref 98–111)
Creatinine, Ser: 0.89 mg/dL (ref 0.44–1.00)
GFR calc Af Amer: >60 mL/min (ref >60)
GFR calc non Af Amer: >60 mL/min (ref >60)
Glucose, Bld: 115 mg/dL — ABNORMAL HIGH (ref 70–99)
Potassium: 3.2 mmol/L — ABNORMAL LOW (ref 3.5–5.1)
Sodium: 132 mmol/L — ABNORMAL LOW (ref 135–145)
Total Bilirubin: 0.9 mg/dL (ref 0.3–1.2)
Total Protein: 7.4 g/dL (ref 6.5–8.1)

## 2019-02-27 LAB — ECHOCARDIOGRAM COMPLETE
Height: 64 in
Weight: 2209.6 oz

## 2019-02-27 LAB — APTT: aPTT: 32 seconds (ref 24–36)

## 2019-02-27 LAB — SYNOVIAL CELL COUNT + DIFF, W/ CRYSTALS
Crystals, Fluid: NONE SEEN
Eosinophils-Synovial: 0 %
Lymphocytes-Synovial Fld: 10 %
Monocyte-Macrophage-Synovial Fluid: 3 %
Neutrophil, Synovial: 87 %
WBC, Synovial: 30687 /mm3 — ABNORMAL HIGH (ref 0–200)

## 2019-02-27 LAB — LACTIC ACID, PLASMA
Lactic Acid, Venous: 1.1 mmol/L (ref 0.5–1.9)
Lactic Acid, Venous: 0.9 mmol/L (ref 0.5–1.9)

## 2019-02-27 LAB — SARS CORONAVIRUS 2 BY RT PCR (HOSPITAL ORDER, PERFORMED IN ~~LOC~~ HOSPITAL LAB): SARS Coronavirus 2: NEGATIVE

## 2019-02-27 LAB — PROTIME-INR
INR: 1.6 — ABNORMAL HIGH (ref 0.8–1.2)
Prothrombin Time: 18.8 seconds — ABNORMAL HIGH (ref 11.4–15.2)

## 2019-02-27 MED ORDER — PANTOPRAZOLE SODIUM 40 MG PO TBEC
40.0000 mg | DELAYED_RELEASE_TABLET | Freq: Every day | ORAL | Status: DC
  Administered 2019-02-27 – 2019-03-03 (×5): 40 mg via ORAL
  Filled 2019-02-27 (×6): qty 1

## 2019-02-27 MED ORDER — DILTIAZEM LOAD VIA INFUSION
10.0000 mg | Freq: Once | INTRAVENOUS | Status: AC
  Administered 2019-02-27 (×2): 10 mg via INTRAVENOUS

## 2019-02-27 MED ORDER — PREDNISONE 10 MG PO TABS
5.0000 mg | ORAL_TABLET | Freq: Every day | ORAL | Status: DC
  Administered 2019-02-27 – 2019-03-03 (×5): 5 mg via ORAL
  Filled 2019-02-27 (×5): qty 1

## 2019-02-27 MED ORDER — SODIUM CHLORIDE 0.9 % IV SOLN
INTRAVENOUS | Status: DC
  Administered 2019-02-27 – 2019-02-28 (×3): via INTRAVENOUS

## 2019-02-27 MED ORDER — SODIUM CHLORIDE 0.9 % IV SOLN
2.0000 g | Freq: Once | INTRAVENOUS | Status: AC
  Administered 2019-02-27: 2 g via INTRAVENOUS
  Filled 2019-02-27: qty 2

## 2019-02-27 MED ORDER — TRAMADOL HCL 50 MG PO TABS: 50.0000 mg | ORAL_TABLET | Freq: Two times a day (BID) | ORAL | Status: DC | PRN

## 2019-02-27 MED ORDER — VITAMIN D3 25 MCG (1000 UNIT) PO TABS
1000.0000 [IU] | ORAL_TABLET | Freq: Every day | ORAL | Status: DC
  Administered 2019-02-27 – 2019-03-03 (×5): 1000 [IU] via ORAL
  Filled 2019-02-27 (×10): qty 1

## 2019-02-27 MED ORDER — SODIUM CHLORIDE 0.9 % IV SOLN
2.0000 g | Freq: Three times a day (TID) | INTRAVENOUS | Status: DC
  Filled 2019-02-27 (×2): qty 2

## 2019-02-27 MED ORDER — ACETAMINOPHEN 325 MG PO TABS
650.0000 mg | ORAL_TABLET | Freq: Once | ORAL | Status: AC
  Administered 2019-02-27: 650 mg via ORAL
  Filled 2019-02-27: qty 2

## 2019-02-27 MED ORDER — ONDANSETRON HCL 4 MG/2ML IJ SOLN: 4.0000 mg | Freq: Four times a day (QID) | INTRAMUSCULAR | Status: DC | PRN

## 2019-02-27 MED ORDER — HYDROXYCHLOROQUINE SULFATE 200 MG PO TABS
200.0000 mg | ORAL_TABLET | Freq: Two times a day (BID) | ORAL | Status: DC
  Administered 2019-02-27: 200 mg via ORAL
  Filled 2019-02-27 (×2): qty 1

## 2019-02-27 MED ORDER — RISAQUAD PO CAPS
1.0000 | ORAL_CAPSULE | Freq: Every day | ORAL | Status: DC
  Administered 2019-02-27 – 2019-03-03 (×5): 1 via ORAL
  Filled 2019-02-27 (×5): qty 1

## 2019-02-27 MED ORDER — APIXABAN 5 MG PO TABS
5.0000 mg | ORAL_TABLET | Freq: Two times a day (BID) | ORAL | Status: DC
  Administered 2019-02-27 – 2019-03-03 (×9): 5 mg via ORAL
  Filled 2019-02-27 (×9): qty 1

## 2019-02-27 MED ORDER — CALCIUM CARBONATE-VITAMIN D 500-200 MG-UNIT PO TABS
1.0000 | ORAL_TABLET | Freq: Two times a day (BID) | ORAL | Status: DC
  Administered 2019-02-27 – 2019-03-03 (×9): 1 via ORAL
  Filled 2019-02-27 (×9): qty 1

## 2019-02-27 MED ORDER — ASPIRIN EC 81 MG PO TBEC
81.0000 mg | DELAYED_RELEASE_TABLET | Freq: Every day | ORAL | Status: DC
  Administered 2019-02-27 – 2019-03-03 (×5): 81 mg via ORAL
  Filled 2019-02-27 (×5): qty 1

## 2019-02-27 MED ORDER — HYDROXYZINE HCL 25 MG PO TABS
25.0000 mg | ORAL_TABLET | Freq: Every day | ORAL | Status: DC | PRN
  Administered 2019-03-03: 04:00:00 25 mg via ORAL
  Filled 2019-02-27: qty 1

## 2019-02-27 MED ORDER — BLISTEX MEDICATED EX OINT
1.0000 "application " | TOPICAL_OINTMENT | CUTANEOUS | Status: DC | PRN
  Filled 2019-02-27: qty 6.3

## 2019-02-27 MED ORDER — ACETAMINOPHEN 650 MG RE SUPP: 650.0000 mg | Freq: Four times a day (QID) | RECTAL | Status: DC | PRN

## 2019-02-27 MED ORDER — GUAIFENESIN-DM 100-10 MG/5ML PO SYRP: 5.0000 mL | ORAL_SOLUTION | ORAL | Status: DC | PRN

## 2019-02-27 MED ORDER — VANCOMYCIN HCL IN DEXTROSE 1-5 GM/200ML-% IV SOLN: 1000.0000 mg | Freq: Once | INTRAVENOUS | Status: DC

## 2019-02-27 MED ORDER — MAGNESIUM HYDROXIDE 400 MG/5ML PO SUSP: 30.0000 mL | Freq: Every day | ORAL | Status: DC | PRN

## 2019-02-27 MED ORDER — ACETAMINOPHEN 325 MG PO TABS: 650.0000 mg | ORAL_TABLET | Freq: Four times a day (QID) | ORAL | Status: DC | PRN

## 2019-02-27 MED ORDER — DILTIAZEM HCL 100 MG IV SOLR
5.0000 mg/h | INTRAVENOUS | Status: DC
  Administered 2019-02-27 (×2): 5 mg/h via INTRAVENOUS
  Filled 2019-02-27: qty 100

## 2019-02-27 MED ORDER — SIMVASTATIN 20 MG PO TABS
20.0000 mg | ORAL_TABLET | Freq: Every day | ORAL | Status: DC
  Administered 2019-02-27 – 2019-03-02 (×4): 20 mg via ORAL
  Filled 2019-02-27: qty 2
  Filled 2019-02-27: qty 1
  Filled 2019-02-27: qty 2
  Filled 2019-02-27: qty 1

## 2019-02-27 MED ORDER — ONDANSETRON HCL 4 MG/2ML IJ SOLN
4.0000 mg | Freq: Once | INTRAMUSCULAR | Status: AC
  Administered 2019-02-27: 4 mg via INTRAVENOUS
  Filled 2019-02-27: qty 2

## 2019-02-27 MED ORDER — VANCOMYCIN HCL IN DEXTROSE 1-5 GM/200ML-% IV SOLN
1000.0000 mg | INTRAVENOUS | Status: DC
  Administered 2019-02-28 – 2019-03-01 (×2): 1000 mg via INTRAVENOUS
  Filled 2019-02-27 (×2): qty 200

## 2019-02-27 MED ORDER — SALINE SPRAY 0.65 % NA SOLN
1.0000 | NASAL | Status: DC | PRN
  Filled 2019-02-27: qty 44

## 2019-02-27 MED ORDER — CALCIUM CARBONATE-VITAMIN D 600-400 MG-UNIT PO TABS: 1.0000 | ORAL_TABLET | Freq: Two times a day (BID) | ORAL | Status: DC

## 2019-02-27 MED ORDER — ONDANSETRON HCL 4 MG PO TABS: 4.0000 mg | ORAL_TABLET | Freq: Four times a day (QID) | ORAL | Status: DC | PRN

## 2019-02-27 MED ORDER — ONDANSETRON 4 MG PO TBDP
4.0000 mg | ORAL_TABLET | Freq: Three times a day (TID) | ORAL | Status: DC | PRN
  Filled 2019-02-27: qty 1

## 2019-02-27 MED ORDER — TRAZODONE HCL 50 MG PO TABS
25.0000 mg | ORAL_TABLET | Freq: Every evening | ORAL | Status: DC | PRN
  Administered 2019-03-02: 25 mg via ORAL
  Filled 2019-02-27: qty 1

## 2019-02-27 MED ORDER — ALPRAZOLAM 0.25 MG PO TABS
0.2500 mg | ORAL_TABLET | Freq: Every day | ORAL | Status: DC | PRN
  Administered 2019-02-27 – 2019-03-02 (×4): 0.25 mg via ORAL
  Filled 2019-02-27 (×4): qty 1

## 2019-02-27 MED ORDER — BUSPIRONE HCL 5 MG PO TABS
5.0000 mg | ORAL_TABLET | Freq: Every evening | ORAL | Status: DC | PRN
  Filled 2019-02-27: qty 1

## 2019-02-27 MED ORDER — ALBUTEROL SULFATE (2.5 MG/3ML) 0.083% IN NEBU: 3.0000 mL | INHALATION_SOLUTION | RESPIRATORY_TRACT | Status: DC | PRN

## 2019-02-27 MED ORDER — METRONIDAZOLE IN NACL 5-0.79 MG/ML-% IV SOLN
500.0000 mg | Freq: Three times a day (TID) | INTRAVENOUS | Status: DC
  Filled 2019-02-27 (×2): qty 100

## 2019-02-27 MED ORDER — SODIUM CHLORIDE 0.9 % IV SOLN
2.0000 g | Freq: Two times a day (BID) | INTRAVENOUS | Status: DC
  Filled 2019-02-27: qty 2

## 2019-02-27 MED ORDER — LEVOFLOXACIN IN D5W 750 MG/150ML IV SOLN
750.0000 mg | INTRAVENOUS | Status: DC
  Filled 2019-02-27: qty 150

## 2019-02-27 MED ORDER — SODIUM CHLORIDE 0.9 % IV SOLN
2.0000 g | INTRAVENOUS | Status: DC
  Administered 2019-02-28: 2 g via INTRAVENOUS
  Filled 2019-02-27: qty 2
  Filled 2019-02-27 (×2): qty 20

## 2019-02-27 MED ORDER — ENOXAPARIN SODIUM 40 MG/0.4ML ~~LOC~~ SOLN: 40.0000 mg | SUBCUTANEOUS | Status: DC

## 2019-02-27 MED ORDER — POTASSIUM CHLORIDE CRYS ER 10 MEQ PO TBCR
10.0000 meq | EXTENDED_RELEASE_TABLET | Freq: Every day | ORAL | Status: DC
  Administered 2019-02-27 – 2019-03-03 (×5): 10 meq via ORAL
  Filled 2019-02-27 (×5): qty 1

## 2019-02-27 MED ORDER — ALIGN PO CAPS: 1.0000 | ORAL_CAPSULE | Freq: Every day | ORAL | Status: DC

## 2019-02-27 MED ORDER — ADULT MULTIVITAMIN W/MINERALS CH
1.0000 | ORAL_TABLET | Freq: Every day | ORAL | Status: DC
  Administered 2019-02-27 – 2019-03-03 (×5): 1 via ORAL
  Filled 2019-02-27 (×5): qty 1

## 2019-02-27 MED ORDER — ALENDRONATE SODIUM 70 MG PO TABS: 70.0000 mg | ORAL_TABLET | ORAL | Status: DC

## 2019-02-27 MED ORDER — MECLIZINE HCL 12.5 MG PO TABS
12.5000 mg | ORAL_TABLET | Freq: Three times a day (TID) | ORAL | Status: DC | PRN
  Filled 2019-02-27: qty 0.5

## 2019-02-27 MED ORDER — TANDEM PLUS 162-115.2-1 MG PO CAPS: 1.0000 | ORAL_CAPSULE | Freq: Every day | ORAL | Status: DC

## 2019-02-27 MED ORDER — POTASSIUM CHLORIDE CRYS ER 20 MEQ PO TBCR
40.0000 meq | EXTENDED_RELEASE_TABLET | Freq: Once | ORAL | Status: AC
  Administered 2019-02-27: 40 meq via ORAL
  Filled 2019-02-27: qty 2

## 2019-02-27 MED ORDER — SODIUM CHLORIDE 0.9 % IV SOLN: 2.0000 g | Freq: Once | INTRAVENOUS | Status: DC

## 2019-02-27 MED ORDER — FLUTICASONE PROPIONATE 50 MCG/ACT NA SUSP
2.0000 | Freq: Every day | NASAL | Status: DC
  Administered 2019-02-27 – 2019-03-03 (×5): 2 via NASAL
  Filled 2019-02-27: qty 16

## 2019-02-27 MED ORDER — LORATADINE 10 MG PO TABS
10.0000 mg | ORAL_TABLET | Freq: Every day | ORAL | Status: DC
  Administered 2019-02-27 – 2019-03-03 (×5): 10 mg via ORAL
  Filled 2019-02-27 (×5): qty 1

## 2019-02-27 MED ORDER — COLESTIPOL HCL 1 G PO TABS
2.0000 g | ORAL_TABLET | Freq: Every day | ORAL | Status: DC
  Administered 2019-02-27 – 2019-03-03 (×5): 2 g via ORAL
  Filled 2019-02-27 (×5): qty 2

## 2019-02-27 MED ORDER — DILTIAZEM HCL 100 MG IV SOLR
INTRAVENOUS | Status: AC
  Filled 2019-02-27: qty 100

## 2019-02-27 MED ORDER — VANCOMYCIN HCL IN DEXTROSE 1-5 GM/200ML-% IV SOLN: 1000.0000 mg | INTRAVENOUS | Status: DC

## 2019-02-27 MED ORDER — FUROSEMIDE 20 MG PO TABS
20.0000 mg | ORAL_TABLET | Freq: Every day | ORAL | Status: DC
  Administered 2019-02-27 – 2019-03-03 (×5): 20 mg via ORAL
  Filled 2019-02-27 (×5): qty 1

## 2019-02-27 MED ORDER — VANCOMYCIN HCL 10 G IV SOLR
1250.0000 mg | Freq: Once | INTRAVENOUS | Status: AC
  Administered 2019-02-27: 06:00:00 1250 mg via INTRAVENOUS
  Filled 2019-02-27: qty 1250

## 2019-02-27 MED ORDER — LIDOCAINE HCL 2 % IJ SOLN
10.0000 mL | Freq: Once | INTRAMUSCULAR | Status: AC
  Administered 2019-02-27: 200 mg via INTRADERMAL
  Filled 2019-02-27: qty 10

## 2019-02-27 MED ORDER — SODIUM CHLORIDE 0.9 % IV BOLUS
500.0000 mL | Freq: Once | INTRAVENOUS | Status: AC
  Administered 2019-02-27: 500 mL via INTRAVENOUS

## 2019-02-27 MED ORDER — METRONIDAZOLE IN NACL 5-0.79 MG/ML-% IV SOLN
500.0000 mg | Freq: Once | INTRAVENOUS | Status: AC
  Administered 2019-02-27: 500 mg via INTRAVENOUS
  Filled 2019-02-27: qty 100

## 2019-02-27 NOTE — ED Provider Notes (Signed)
[facility] Emergency Department Provider Note  ____________________________________________   First MD Initiated Contact with Patient 02/27/19 0251     (approximate)  I have reviewed the triage vital signs and the nursing notes.   HISTORY  Chief Complaint Near Syncope    HPI Kaitlyn Huff is a 78 y.o. female  With h/o RA, AFib on eliquis, here with generalized weakness and L>R knee pain. Pt reports that approx one week ago, she bumped both her knees on a cabinet. Since then, she's had worsening b/l knee pain. She's also had generalized weakness, SOb with exertion, and general "shakiness." She denies any known fevers but has had some chills. She's also had nausea, vomiting. No diarrhea. No abdominal pain. No urinary sx. Her main complaint now is severe, 8/10, eleft knee pain. No alleviating factors. Worse w/ any movement or palpation.       Past Medical History:  Diagnosis Date  . Anemia   . Cancer ([place])   . Diverticulitis   . GERD (gastroesophageal reflux disease)   . Hyperlipidemia   . Hypertension   . Osteoarthritis   . Osteoporosis    actonel  . Positive PPD    s/p INH (2006)  . Rheumatoid arthritis(714.0)    MTX transaminitis, Leflunomide (rash), enbrel, plaquinil, prednisone, remicade, Imuran (transaminitis)  . Valvular heart disease    moderate MR and TR    Patient Active Problem List   Diagnosis Date Noted  . Atrial fibrillation with rapid ventricular response ([place]) 02/27/2019  . DJD (degenerative joint disease), lumbosacral 04/02/2018  . Nodule of groin 04/01/2018  . Fatty liver 03/29/2018  . Anxiety 01/19/2018  . Difficulty sleeping 01/19/2018  . Acute urticaria 12/21/2017  . Colitis 06/24/2017  . Dizziness 10/01/2016  . A-fib ([place]) 07/12/2016  . 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 Chronic venous insufficiency 04/25/2016  . Pain in limb 04/25/2016  . Swelling of limb 04/25/2016  .  Tachycardia 03/22/2016  . Foot fracture 03/18/2016  . Cough 01/27/2016  . Hypoxia 12/20/2015  . Lower extremity edema 12/18/2015  . Diarrhea 09/15/2015  . Restless leg syndrome 08/07/2015  . History of colonic polyps 07/10/2015  . Health care maintenance 01/11/2015  . Weight loss 08/04/2013  . Obstructive sleep apnea 12/23/2012  . Dilated bile duct 12/23/2012  . Hypercholesterolemia 08/12/2012  . Rheumatoid arthritis ([place]) 08/12/2012  . GERD (gastroesophageal reflux disease) 08/12/2012  . Hypertension 08/12/2012  . Anemia 08/12/2012    Past Surgical History:  Procedure Laterality Date  . ABDOMINAL HYSTERECTOMY    . CERVICAL CONE BIOPSY    . CHOLECYSTECTOMY  06/22/14  . COLONOSCOPY WITH PROPOFOL N/A 07/09/2015   Procedure: COLONOSCOPY WITH PROPOFOL;  Surgeon: Manya Silvas, MD;  Location: [facility] ENDOSCOPY;  Service: Endoscopy;  Laterality: N/A;  . ESOPHAGOGASTRODUODENOSCOPY (EGD) WITH PROPOFOL N/A 07/09/2015   Procedure: ESOPHAGOGASTRODUODENOSCOPY (EGD) WITH PROPOFOL;  Surgeon: Manya Silvas, MD;  Location: [facility] ENDOSCOPY;  Service: Endoscopy;  Laterality: N/A;  . OVARY SURGERY    . TRACHEOSTOMY  1959  . TUBAL LIGATION      Prior to Admission medications   Medication Sig Start Date End Date Taking? Authorizing Provider  acetaminophen (TYLENOL) 325 MG tablet Take 2 tablets (650 mg total) by mouth every 6 (six) hours as needed for mild pain (or Fever >/= 101). 06/26/17  Yes Gouru, Aruna, MD  albuterol (PROAIR HFA) 108 (90 Base) MCG/ACT inhaler USE 2 INHALATIONS EVERY 6 HOURS AS NEEDED FOR WHEEZING  OR SHORTNESS OF BREATH 06/14/18  Yes [name], MD  alendronate (FOSAMAX) 70 MG tablet Take 70 mg by mouth once a week.  09/04/15  Yes [provider]  ALPRAZolam (XANAX) 0.25 MG tablet Take 1 tablet (0.25 mg total) by mouth daily as needed for anxiety. 02/20/19  Yes [name], MD  apixaban (ELIQUIS) 5 MG TABS tablet Take 5 mg by mouth 2 (two) times daily.   Yes  [provider]  Calcium Carbonate-Vitamin D (CALCIUM 600+D) 600-400 MG-UNIT tablet Take 1 tablet by mouth 2 (two) times daily.    Yes [provider]  cetirizine (ZYRTEC) 10 MG tablet TAKE 1 TABLET DAILY 04/10/18  Yes [name], MD  cholecalciferol (VITAMIN D) 1000 units tablet Take 1,000 Units by mouth daily.   Yes [provider]  colestipol (COLESTID) 1 g tablet Take 2 g by mouth daily.    Yes [provider]  esomeprazole (NEXIUM) 20 MG capsule Take 1 capsule (20 mg total) by mouth daily at 12 noon. 09/06/18  Yes [name], MD  fluticasone (FLONASE) 50 MCG/ACT nasal spray Place 2 sprays into both nostrils daily. 02/19/19  Yes [name], MD  furosemide (LASIX) 20 MG tablet Take 20 mg by mouth daily.   Yes [provider]  lip balm (BLISTEX) OINT Apply 1 application topically as needed for lip care. 06/21/16  Yes Wieting, Richard, MD  meclizine (ANTIVERT) 25 MG tablet take 1/2 to 1 tablet every 8 hours if needed for VERTIGO 11/17/17  Yes [name], MD  Multiple Vitamin (MULTIVITAMIN WITH MINERALS) TABS tablet Take 1 tablet by mouth daily.   Yes [provider]  ondansetron (ZOFRAN ODT) 4 MG disintegrating tablet Take 1 tablet (4 mg total) by mouth every 8 (eight) hours as needed for nausea or vomiting. 11/19/16  Yes Lavonia Drafts, MD  potassium chloride (MICRO-K) 10 MEQ CR capsule TAKE 2 CAPSULES BY MOUTH EVERY DAY 11/22/18  Yes [name], MD  predniSONE (DELTASONE) 5 MG tablet Take 1 tablet (5 mg total) by mouth daily with breakfast. Patient taking differently: Take 2.5 mg by mouth daily with breakfast.  12/23/16  Yes Mody, Ulice Bold, MD  Probiotic Product (ALIGN PO) Take 1 tablet by mouth daily.   Yes [provider]  simvastatin (ZOCOR) 20 MG tablet TAKE 1 TABLET DAILY 07/18/18  Yes [name], MD  sodium chloride (OCEAN) 0.65 % nasal spray Place 1 spray into the nose as needed for congestion.   Yes [provider]  triamcinolone cream (KENALOG) 0.1 % Apply 1 application topically 2 (two) times daily. Prn 12/21/17  Yes McLean-Scocuzza, Nino Glow, MD  acidophilus (RISAQUAD) CAPS capsule Take 1 capsule by mouth daily.    [provider]  aspirin EC 81 MG tablet Take 81 mg by mouth daily.    [provider]  busPIRone (BUSPAR) 5 MG tablet Take 1 tablet (5 mg total) by mouth at bedtime as needed. Patient not taking: Reported on 02/27/2019 07/12/18   [name], MD  FeFum-FePo-FA-B Cmp-C-Zn-Mn-Cu (TANDEM PLUS) 162-115.2-1 MG CAPS Take 1 capsule by mouth daily. Patient not taking: Reported on 02/27/2019 05/21/18   [name], MD  guaiFENesin-dextromethorphan [facility] DM) 100-10 MG/5ML syrup Take 5 mLs by mouth every 4 (four) hours as needed for cough. Patient not taking: Reported on 08/03/2018 08/07/15   Theodoro Grist, MD  hydroxychloroquine (PLAQUENIL) 200 MG tablet Take 200 mg by mouth 2 (two) times daily.  03/27/17   [provider]  hydrOXYzine (ATARAX/VISTARIL)  25 MG tablet Take 1 tablet (25 mg total) by mouth daily as needed. If you take at night do not take with xanax Patient not taking: Reported on 02/27/2019 01/07/19   [name], MD  traMADol (ULTRAM) 50 MG tablet Take 50 mg by mouth 2 (two) times daily as needed for moderate pain.    [provider]    Allergies Tramadol, Astelin [azelastine hcl], Codeine, Dilaudid [hydromorphone], Flexeril [cyclobenzaprine], Imuran [azathioprine], Lisinopril, Lyrica [pregabalin], Methotrexate derivatives, Amiodarone, Amoxicillin, Arava [leflunomide], Clindamycin/lincomycin, Doxycycline, Lodine [etodolac], Percocet [oxycodone-acetaminophen], and Sulfa antibiotics  Family History  Problem Relation Age of Onset  . Heart disease Father        MI  . Heart disease Mother   . Valvular heart disease Mother   . Breast cancer Sister 32  . Cancer Sister        Lung cancer  . COPD Sister   . Heart disease Sister    . Colon cancer Neg Hx     Social History Social History   Tobacco Use  . Smoking status: Never Smoker  . Smokeless tobacco: Never Used  Substance Use Topics  . Alcohol use: No    Alcohol/week: 0.0 standard drinks  . Drug use: No    Review of Systems  Review of Systems  Constitutional: Positive for fatigue. Negative for fever.  HENT: Negative for congestion and sore throat.   Eyes: Negative for visual disturbance.  Respiratory: Positive for shortness of breath. Negative for cough.   Cardiovascular: Positive for palpitations. Negative for chest pain.  Gastrointestinal: Positive for nausea and vomiting. Negative for abdominal pain and diarrhea.  Genitourinary: Negative for flank pain.  Musculoskeletal: Positive for arthralgias and joint swelling. Negative for back pain and neck pain.  Skin: Negative for rash and wound.  Neurological: Positive for weakness.  All other systems reviewed and are negative.    ____________________________________________  PHYSICAL EXAM:      VITAL SIGNS: ED Triage Vitals [02/27/19 0253]  Enc Vitals Group     BP      Pulse      Resp      Temp      Temp src      SpO2 98 %     Weight      Height      Head Circumference      Peak Flow      Pain Score      Pain Loc      Pain Edu?      Excl. in [place]?      Physical Exam Vitals signs and nursing note reviewed.  Constitutional:      General: She is not in acute distress.    Appearance: She is well-developed.  HENT:     Head: Normocephalic and atraumatic.  Eyes:     Conjunctiva/sclera: Conjunctivae normal.  Neck:     Musculoskeletal: Neck supple.  Cardiovascular:     Rate and Rhythm: Tachycardia present. Rhythm irregularly irregular.     Heart sounds: Normal heart sounds. No murmur. No friction rub.  Pulmonary:     Effort: Pulmonary effort is normal. No respiratory distress.     Breath sounds: Normal breath sounds. No wheezing or rales.  Abdominal:     General: There is no  distension.     Palpations: Abdomen is soft.     Tenderness: There is no abdominal tenderness.     Comments: No abdominal TTP noted  Musculoskeletal:     Comments: Large effusion and  warmth noted to left knee, with exquisite pain with any pROM. No overlying erythema. Mild TTP over left knee, no appreciable effusion.  Skin:    General: Skin is warm.     Capillary Refill: Capillary refill takes less than 2 seconds.  Neurological:     Mental Status: She is alert and oriented to person, place, and time.     Motor: No abnormal muscle tone.       ____________________________________________   LABS (all labs ordered are listed, but only abnormal results are displayed)  Labs Reviewed  COMPREHENSIVE METABOLIC PANEL - Abnormal; Notable for the following components:      Result Value   Sodium 132 (*)    Potassium 3.2 (*)    Chloride 97 (*)    Glucose, Bld 115 (*)    Calcium 8.7 (*)    AST 42 (*)    All other components within normal limits  CBC WITH DIFFERENTIAL/PLATELET - Abnormal; Notable for the following components:   WBC 11.3 (*)    RBC 3.62 (*)    Hemoglobin 11.2 (*)    HCT 32.9 (*)    Neutro Abs 8.2 (*)    Monocytes Absolute 1.4 (*)    All other components within normal limits  PROTIME-INR - Abnormal; Notable for the following components:   Prothrombin Time 18.8 (*)    INR 1.6 (*)    All other components within normal limits  URINALYSIS, ROUTINE W REFLEX MICROSCOPIC - Abnormal; Notable for the following components:   Color, Urine YELLOW (*)    APPearance CLEAR (*)    All other components within normal limits  SARS CORONAVIRUS 2 (HOSPITAL ORDER, [place] [facility])  CULTURE, BLOOD (ROUTINE X 2)  CULTURE, BLOOD (ROUTINE X 2)  URINE CULTURE  BODY FLUID CULTURE  LACTIC ACID, PLASMA  LACTIC ACID, PLASMA  APTT  GLUCOSE, BODY FLUID OTHER  PROTEIN, BODY FLUID (OTHER)  SYNOVIAL CELL COUNT + DIFF, W/ CRYSTALS     ____________________________________________  EKG: AFib RVR, VR 142. Non-spcific St changes likely rate related. No St elevations or depressions. ________________________________________  RADIOLOGY All imaging, including plain films, CT scans, and ultrasounds, independently reviewed by me, and interpretations confirmed via formal radiology reads.  ED MD interpretation:   CXR: No acute abnormality Knee films: L knee effusion, chronic degenerative changes, no fx  Official radiology report(s): Dg Chest Port 1 View  Result Date: 02/27/2019 CLINICAL DATA:  Shortness of breath EXAM: PORTABLE CHEST 1 VIEW COMPARISON:  07/23/2018 FINDINGS: The heart size and mediastinal contours are within normal limits. Both lungs are clear. The visualized skeletal structures are unremarkable. IMPRESSION: No active disease. Electronically Signed   By: Kathreen Devoid   On: 02/27/2019 03:59   Dg Knee Complete 4 Views Left  Result Date: 02/27/2019 CLINICAL DATA:  Bilateral knee pain status post fall EXAM: LEFT KNEE - COMPLETE 4+ VIEW COMPARISON:  None. FINDINGS: No acute fracture or dislocation. No aggressive osseous lesion. No joint effusion. 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 Small medial femorotibial compartment marginal osteophytes. IMPRESSION: No acute osseous injury of the left knee. Electronically Signed   By: Kathreen Devoid   On: 02/27/2019 03:59   Dg Knee Complete 4 Views Right  Result Date: 02/27/2019 CLINICAL DATA:  Bilateral knee pain status post fall EXAM: RIGHT KNEE - COMPLETE 4+ VIEW COMPARISON:  None. FINDINGS: No acute fracture or dislocation. No aggressive osseous lesion. Large joint effusion. Chondrocalcinosis  of the medial and lateral femorotibial compartments as can be seen with CPPD. Mild osteoarthritis of the medial femorotibial compartment with mild joint space narrowing and marginal osteophytes. IMPRESSION: 1.  No acute osseous  injury of the right knee. 2. Large joint effusion. Electronically Signed   By: Kathreen Devoid   On: 02/27/2019 04:01    ____________________________________________  PROCEDURES   Procedure(s) performed (including Critical Care):  .Critical Care Performed by: Duffy Bruce, MD Authorized by: Duffy Bruce, MD   Critical care provider statement:    Critical care time (minutes):  35   Critical care time was exclusive of:  Separately billable procedures and treating other patients and teaching time   Critical care was necessary to treat or prevent imminent or life-threatening deterioration of the following conditions:  Cardiac failure, circulatory failure and sepsis   Critical care was time spent personally by me on the following activities:  Development of treatment plan with patient or surrogate, discussions with consultants, evaluation of patient's response to treatment, examination of patient, obtaining history from patient or surrogate, ordering and performing treatments and interventions, ordering and review of laboratory studies, ordering and review of radiographic studies, pulse oximetry, re-evaluation of patient's condition and review of old charts   I assumed direction of critical care for this patient from another provider in my specialty: no   .Joint Aspiration/Arthrocentesis  Date/Time: 02/27/2019 6:21 AM Performed by: Duffy Bruce, MD Authorized by: Duffy Bruce, MD   Consent:    Consent obtained:  Verbal   Consent given by:  Patient   Risks discussed:  Bleeding, incomplete drainage, infection, nerve damage, pain and poor cosmetic result   Alternatives discussed:  Alternative treatment and referral Location:    Location:  Knee   Knee:  L knee Anesthesia (see MAR for exact dosages):    Anesthesia method:  Local infiltration   Local anesthetic:  Lidocaine 1% w/o epi Procedure details:    Preparation: Patient was prepped and draped in usual sterile fashion      Needle gauge:  20 G   Ultrasound guidance: no     Approach:  Superior   Aspirate amount:  50 cc   Aspirate characteristics:  Cloudy and yellow   Steroid injected: no     Specimen collected: yes   Post-procedure details:    Dressing:  Adhesive bandage   Patient tolerance of procedure:  Tolerated well, no immediate complications    ____________________________________________  INITIAL IMPRESSION / MDM / ASSESSMENT AND PLAN / ED COURSE  As part of my medical decision making, I reviewed the following data within the [place] Notes from prior ED visits and  Controlled Substance Database      *Kaitlyn Huff was evaluated in Emergency Department on 02/27/2019 for the symptoms described in the history of present illness. She was evaluated in the context of the global COVID-19 pandemic, which necessitated consideration that the patient might be at risk for infection with the SARS-CoV-2 virus that causes COVID-19. Institutional protocols and algorithms that pertain to the evaluation of patients at risk for COVID-19 are in a state of rapid change based on information released by regulatory bodies including the CDC and federal and state organizations. These policies and algorithms were followed during the patient's care in the ED.  Some ED evaluations and interventions may be delayed as a result of limited staffing during the pandemic.*      Medical Decision Making: 78 yo F with PMHx AFib, RA here with generalized  weakness, fever, and knee pain.  1.) Fever, weakness - Concern for sepsis, unclear source - COVID neg, CXR clear, UA w/o UTI - Left knee tapped - results pending, f/u - Broad spectrum ABX started - 500 cc fluids given, will start mIVF - cautious in setting of CHF, AFib - LA normal, no signs of severe sepsis  2.) AFib RVR - Likely 2/2 above - Dilt gtt started with improvement  3.) Knee swelling - Suspect RA 2/2 being off her immunomodulator - F/u results -  empiric ABX on board  4.) h/o RA - Off her infusion at this time - On chronic prednisone - no signs of shock or adrenal insufficiency currently, hesitant to give stress dose in absence of signs of sepsis and with AFib RVR - consider stress dose as inpatient  ____________________________________________  FINAL CLINICAL IMPRESSION(S) / ED DIAGNOSES  Final diagnoses:  Sepsis without acute organ dysfunction, due to unspecified organism [facility])  Atrial fibrillation with rapid ventricular response ([place])  Effusion of left knee     MEDICATIONS GIVEN DURING THIS VISIT:  Medications  diltiazem (CARDIZEM) 1 mg/mL load via infusion 10 mg (10 mg Intravenous Bolus from Bag 02/27/19 0504)    And  diltiazem (CARDIZEM) 100 mg in dextrose 5 % 100 mL (1 mg/mL) infusion (5 mg/hr Intravenous New Bag/Given 02/27/19 0502)  vancomycin (VANCOCIN) 1,250 mg in sodium chloride 0.9 % 250 mL IVPB (1,250 mg Intravenous New Bag/Given 02/27/19 0601)  enoxaparin (LOVENOX) injection 40 mg (has no administration in time range)  0.9 %  sodium chloride infusion (has no administration in time range)  metroNIDAZOLE (FLAGYL) IVPB 500 mg (has no administration in time range)  acetaminophen (TYLENOL) tablet 650 mg (has no administration in time range)    Or  acetaminophen (TYLENOL) suppository 650 mg (has no administration in time range)  traZODone (DESYREL) tablet 25 mg (has no administration in time range)  magnesium hydroxide (MILK OF MAGNESIA) suspension 30 mL (has no administration in time range)  ondansetron (ZOFRAN) tablet 4 mg (has no administration in time range)    Or  ondansetron (ZOFRAN) injection 4 mg (has no administration in time range)  ceFEPIme (MAXIPIME) 2 g in sodium chloride 0.9 % 100 mL IVPB (has no administration in time range)  ceFEPIme (MAXIPIME) 2 g in sodium chloride 0.9 % 100 mL IVPB (0 g Intravenous Stopped 02/27/19 0440)  metroNIDAZOLE (FLAGYL) IVPB 500 mg (0 mg Intravenous Stopped 02/27/19 0600)   acetaminophen (TYLENOL) tablet 650 mg (650 mg Oral Given 02/27/19 0407)  sodium chloride 0.9 % bolus 500 mL (0 mLs Intravenous Stopped 02/27/19 0443)  ondansetron (ZOFRAN) injection 4 mg (4 mg Intravenous Given 02/27/19 0421)  lidocaine (XYLOCAINE) 2 % (with pres) injection 200 mg (200 mg Intradermal Given 02/27/19 0086)     ED Discharge Orders    None       Note:  This document was prepared using Dragon voice recognition software and may include unintentional dictation errors.   Duffy Bruce, MD 02/27/19 [phone]

## 2019-02-27 NOTE — Plan of Care (Signed)
  Problem: Clinical Measurements: Goal: Ability to maintain clinical measurements within normal limits will improve Outcome: Progressing Goal: Respiratory complications will improve Outcome: Progressing   Problem: Activity: Goal: Risk for activity intolerance will decrease Outcome: Progressing   Problem: Safety: Goal: Ability to remain free from injury will improve Outcome: Progressing   Problem: Cardiac: Goal: Ability to achieve and maintain adequate cardiopulmonary perfusion will improve Outcome: Progressing

## 2019-02-27 NOTE — Consult Note (Signed)
Pharmacy Antibiotic Note  Kaitlyn Huff is a 78 y.o. female admitted on 02/27/2019 with sepsis/fever of unknown source.      Pharmacy has been consulted for Levaquin dosing.  Plan: Levaquin 750mg  q 48 hours  Height: 5\' 4"  (162.6 cm) Weight: 138 lb 1.6 oz (62.6 kg) IBW/kg (Calculated) : 54.7  Temp (24hrs), Avg:99.8 F (37.7 C), Min:99 F (37.2 C), Max:100.5 F (38.1 C)  Recent Labs  Lab 02/27/19 0355 02/27/19 0555  WBC 11.3*  --   CREATININE 0.89  --   LATICACIDVEN 1.1 0.9    Estimated Creatinine Clearance: 47.2 mL/min (by C-G formula based on SCr of 0.89 mg/dL).    Allergies  Allergen Reactions  . Tramadol Itching  . Astelin [Azelastine Hcl] Other (See Comments)    Reaction:  Unknown   . Codeine Other (See Comments)    Reaction:  Altered mental status  . Dilaudid [Hydromorphone] Other (See Comments)    Reaction:  Unknown   . Flexeril [Cyclobenzaprine] Other (See Comments)    Reaction:  Unknown   . Imuran [Azathioprine] Other (See Comments)    Reaction:  Abnormal liver function  . Lisinopril Itching  . Lyrica [Pregabalin] Other (See Comments)    Reaction:  Sore gums   . Methotrexate Derivatives Other (See Comments)    Reaction:  Abnormal liver function  . Amiodarone Anxiety and Other (See Comments)    Pt could not eat or sleep. Caused n/v and "messed her up"  . Amoxicillin Rash and Other (See Comments)    Unable to obtain enough information to answer additional questions about this medication.    Jolee Ewing [Leflunomide] Rash  . Clindamycin/Lincomycin Rash  . Doxycycline Rash  . Lodine [Etodolac] Rash  . Percocet [Oxycodone-Acetaminophen] Rash  . Sulfa Antibiotics Rash    Antimicrobials this admission: 8/19 Vancomycin/Cefepime/Metronidazole x 1  Dose adjustments this admission: None  Microbiology results: 8/19 BCx: pending 8/19 UCx: pending 8/19 Body Fluid Culture (Synovial fluid): Abundant WBC - NO organisms seen COVID NEG  Thank you for allowing  pharmacy to be a part of this patient's care.  Lu Duffel, PharmD, BCPS Clinical Pharmacist 02/27/2019 2:54 PM

## 2019-02-27 NOTE — Progress Notes (Signed)
Pharmacy Antibiotic Note  Kaitlyn Huff is a 78 y.o. female admitted on 02/27/2019 with sepsis.  Pharmacy has been consulted for Vancomycin and Cefepime dosing.  Pt received initial doses in ED  Plan: Cefepime 2gm IV q8h  Vancomycin 1250mg  x 1 then 1000mg  IV Q 24 hrs. Goal AUC 400-550. Expected AUC: 520.9 SCr used: 0.89   Height: 5\' 5"  (165.1 cm) Weight: 130 lb (59 kg) IBW/kg (Calculated) : 57  Temp (24hrs), Avg:100.5 F (38.1 C), Min:100.5 F (38.1 C), Max:100.5 F (38.1 C)  Recent Labs  Lab 02/27/19 0355 02/27/19 0555  WBC 11.3*  --   CREATININE 0.89  --   LATICACIDVEN 1.1 0.9    Estimated Creatinine Clearance: 49.1 mL/min (by C-G formula based on SCr of 0.89 mg/dL).    Allergies  Allergen Reactions  . Tramadol Itching  . Astelin [Azelastine Hcl] Other (See Comments)    Reaction:  Unknown   . Codeine Other (See Comments)    Reaction:  Altered mental status  . Dilaudid [Hydromorphone] Other (See Comments)    Reaction:  Unknown   . Flexeril [Cyclobenzaprine] Other (See Comments)    Reaction:  Unknown   . Imuran [Azathioprine] Other (See Comments)    Reaction:  Abnormal liver function  . Lisinopril Itching  . Lyrica [Pregabalin] Other (See Comments)    Reaction:  Sore gums   . Methotrexate Derivatives Other (See Comments)    Reaction:  Abnormal liver function  . Amiodarone Anxiety and Other (See Comments)    Pt could not eat or sleep. Caused n/v and "messed her up"  . Amoxicillin Rash and Other (See Comments)    Unable to obtain enough information to answer additional questions about this medication.    Jolee Ewing [Leflunomide] Rash  . Clindamycin/Lincomycin Rash  . Doxycycline Rash  . Lodine [Etodolac] Rash  . Percocet [Oxycodone-Acetaminophen] Rash  . Sulfa Antibiotics Rash   Antimicrobials this admission: Vancomycin 8/19 >>  Cefepime 8/19 >>  Flagyl 8/19 >>  Dose adjustments this admission:   Microbiology results: 8/19 BCx: pending 8/19 UCx:  pending  8/19 body fluid pending   Thank you for allowing pharmacy to be a part of this patient's care.  Hart Robinsons A 02/27/2019 6:37 AM

## 2019-02-27 NOTE — Progress Notes (Signed)
Sepsis with Afib RVR,cardizem drip infusing,troponins elevated and cardiology consult done,echo done,iv antibiotics infusing,IVF infusing

## 2019-02-27 NOTE — Progress Notes (Signed)
Admitted this morning for atrial fibrillation with RVR and low-grade fever. Continue Cardizem, drip.  Eliquis, cardiology consulted. 2.  Low-grade fever, patient COVID-19 test is negative.  Bilateral knee pain, status post knee arthrocentesis in the ER, on IV vancomycin, cefepime, Flagyl, will wait for joint fluid analysis culture data. 3.  Slightly elevated troponins likely secondary to atrial fibrillation with RVR, cardiology is consulted. 4.  Hypokalemia, replace potassium.

## 2019-02-27 NOTE — Progress Notes (Signed)
PHARMACY -  BRIEF ANTIBIOTIC NOTE   Pharmacy has received consult(s) for Cefepime and Vancomycin from an ED provider.  The patient's profile has been reviewed for ht/wt/allergies/indication/available labs.    One time order(s) placed for Cefepime 2gm and Vancomycin 1250mg   Further antibiotics/pharmacy consults should be ordered by admitting physician if indicated.                       Thank you, Hart Robinsons A 02/27/2019  4:48 AM

## 2019-02-27 NOTE — ED Triage Notes (Signed)
Pt walked into her recliner, struck R knee and fell to the floor, injuring lower back. Pt rose to go the bathroom tonight and pt became weak and near syncopal tonight. Daughter was able to ease pt to floor w/o injury. Pt reports to EMS that she's been having recent exacerbation of a-fib for approximately 3 days. Pt was unable to get her usual pain injection for RA because she is in uncontrolled a-fib.

## 2019-02-27 NOTE — H&P (Addendum)
[facility] [place] NAME: Kaitlyn Huff    MR#:  [id number]  DATE OF BIRTH:  1941-06-14  DATE OF ADMISSION:  02/27/2019  PRIMARY CARE PHYSICIAN: [name], MD   REQUESTING/REFERRING PHYSICIAN: Duffy Bruce, MD  CHIEF COMPLAINT:   Chief Complaint  Patient presents with  . Near Syncope  Palpitations and weakness  HISTORY OF PRESENT ILLNESS:  Kaitlyn Huff  is a 78 y.o. Caucasian female with a known history of multiple medical problems that will be mentioned below, who presented to the emergency room with acute onset of palpitations and generalized weakness with dizziness.  The patient has been having bilateral knee pain, left more than right.  She bumped her knees on a cabinet about a week ago before her pain started and worsened.  She admits to dyspnea on exertion without cough or wheezing or fever or chills chest pain.  She had nausea and vomiting but denied any current symptoms.  No diarrhea or melena or bright red bleeding per rectum.  She denies any abdominal pain, dysuria, oliguria or hematuria or flank pain.  Upon presentation to the emergency room, her heart rate was 121 and later 143 with a blood pressure of 108/55 and pulse ox symmetry of 90 to 92% on room air 1 low-grade fever of 100.5.  Labs revealed mild hypokalemia with potassium of 3.2 lactic acid was 1.1.  CBC showed WBC of 11.3 with leukocytosis and mild anemia.  INR was 1.6 with PT of 18.8.  Blood cultures were withdrawn.  EKG showed atrial flutter with RVR of 142 with no acute changes.  She had a left knee arthrocentesis and was empirically started on IV vancomycin, cefepime and Flagyl.  She was also given 650 mg p.o. Tylenol as well as 500 mils IV normal saline bolus, 4 mg of IV Zofran.  For her atrial fibrillation she was given 10 mg of IV Cardizem bolus followed by IV drip of 5 mg/h and her rate is in down to the 90s.  She will be admitted to telemetry bed for further  evaluation and management. PAST MEDICAL HISTORY:   Past Medical History:  Diagnosis Date  . Anemia   . Cancer ([place])   . Diverticulitis   . GERD (gastroesophageal reflux disease)   . Hyperlipidemia   . Hypertension   . Osteoarthritis   . Osteoporosis    actonel  . Positive PPD    s/p INH (2006)  . Rheumatoid arthritis(714.0)    MTX transaminitis, Leflunomide (rash), enbrel, plaquinil, prednisone, remicade, Imuran (transaminitis)  . Valvular heart disease    moderate MR and TR    PAST SURGICAL HISTORY:   Past Surgical History:  Procedure Laterality Date  . ABDOMINAL HYSTERECTOMY    . CERVICAL CONE BIOPSY    . CHOLECYSTECTOMY  06/22/14  . COLONOSCOPY WITH PROPOFOL N/A 07/09/2015   Procedure: COLONOSCOPY WITH PROPOFOL;  Surgeon: Manya Silvas, MD;  Location[facility] ENDOSCOPY;  Service: Endoscopy;  Laterality: N/A;  . ESOPHAGOGASTRODUODENOSCOPY (EGD) WITH PROPOFOL N/A 07/09/2015   Procedure: ESOPHAGOGASTRODUODENOSCOPY (EGD) WITH PROPOFOL;  Surgeon: Manya Silvas, MD;  Location[facility] ENDOSCOPY;  Service: Endoscopy;  Laterality: N/A;  . OVARY SURGERY    . TRACHEOSTOMY  1959  . TUBAL LIGATION      SOCIAL HISTORY:   Social History   Tobacco Use  . Smoking status: Never Smoker  . Smokeless tobacco: Never Used  Substance Use Topics  . Alcohol use: No  Alcohol/week: 0.0 standard drinks    FAMILY HISTORY:   Family History  Problem Relation Age of Onset  . Heart disease Father        MI  . Heart disease Mother   . Valvular heart disease Mother   . Breast cancer Sister 88  . Cancer Sister        Lung cancer  . COPD Sister   . Heart disease Sister   . Colon cancer Neg Hx     DRUG ALLERGIES:   Allergies  Allergen Reactions  . Tramadol Itching  . Astelin [Azelastine Hcl] Other (See Comments)    Reaction:  Unknown   . Codeine Other (See Comments)    Reaction:  Altered mental status  . Dilaudid [Hydromorphone] Other (See Comments)    Reaction:  Unknown    . Flexeril [Cyclobenzaprine] Other (See Comments)    Reaction:  Unknown   . Imuran [Azathioprine] Other (See Comments)    Reaction:  Abnormal liver function  . Lisinopril Itching  . Lyrica [Pregabalin] Other (See Comments)    Reaction:  Sore gums   . Methotrexate Derivatives Other (See Comments)    Reaction:  Abnormal liver function  . Amiodarone Anxiety and Other (See Comments)    Pt could not eat or sleep. Caused n/v and "messed her up"  . Amoxicillin Rash and Other (See Comments)    Unable to obtain enough information to answer additional questions about this medication.    Jolee Ewing [Leflunomide] Rash  . Clindamycin/Lincomycin Rash  . Doxycycline Rash  . Lodine [Etodolac] Rash  . Percocet [Oxycodone-Acetaminophen] Rash  . Sulfa Antibiotics Rash    REVIEW OF SYSTEMS:   ROS As per history of present illness. All pertinent systems were reviewed above. Constitutional,  HEENT, cardiovascular, respiratory, GI, GU, musculoskeletal, neuro, psychiatric, endocrine,  integumentary and hematologic systems were reviewed and are otherwise  negative/unremarkable except for positive findings mentioned above in the HPI.   MEDICATIONS AT HOME:   Prior to Admission medications   Medication Sig Start Date End Date Taking? Authorizing Provider  acetaminophen (TYLENOL) 325 MG tablet Take 2 tablets (650 mg total) by mouth every 6 (six) hours as needed for mild pain (or Fever >/= 101). 06/26/17  Yes Gouru, Aruna, MD  albuterol (PROAIR HFA) 108 (90 Base) MCG/ACT inhaler USE 2 INHALATIONS EVERY 6 HOURS AS NEEDED FOR WHEEZING OR SHORTNESS OF BREATH 06/14/18  Yes [name], MD  alendronate (FOSAMAX) 70 MG tablet Take 70 mg by mouth once a week.  09/04/15  Yes [provider]  ALPRAZolam (XANAX) 0.25 MG tablet Take 1 tablet (0.25 mg total) by mouth daily as needed for anxiety. 02/20/19  Yes [name], MD  apixaban (ELIQUIS) 5 MG TABS tablet Take 5 mg by mouth 2 (two) times daily.   Yes  [provider]  Calcium Carbonate-Vitamin D (CALCIUM 600+D) 600-400 MG-UNIT tablet Take 1 tablet by mouth 2 (two) times daily.    Yes [provider]  cetirizine (ZYRTEC) 10 MG tablet TAKE 1 TABLET DAILY 04/10/18  Yes [name], MD  cholecalciferol (VITAMIN D) 1000 units tablet Take 1,000 Units by mouth daily.   Yes [provider]  colestipol (COLESTID) 1 g tablet Take 2 g by mouth daily.    Yes [provider]  esomeprazole (NEXIUM) 20 MG capsule Take 1 capsule (20 mg total) by mouth daily at 12 noon. 09/06/18  Yes [name], MD  fluticasone (FLONASE) 50 MCG/ACT nasal spray Place 2 sprays into  both nostrils daily. 02/19/19  Yes [name], MD  furosemide (LASIX) 20 MG tablet Take 20 mg by mouth daily.   Yes [provider]  lip balm (BLISTEX) OINT Apply 1 application topically as needed for lip care. 06/21/16  Yes Wieting, Richard, MD  meclizine (ANTIVERT) 25 MG tablet take 1/2 to 1 tablet every 8 hours if needed for VERTIGO 11/17/17  Yes [name], MD  Multiple Vitamin (MULTIVITAMIN WITH MINERALS) TABS tablet Take 1 tablet by mouth daily.   Yes [provider]  ondansetron (ZOFRAN ODT) 4 MG disintegrating tablet Take 1 tablet (4 mg total) by mouth every 8 (eight) hours as needed for nausea or vomiting. 11/19/16  Yes Lavonia Drafts, MD  potassium chloride (MICRO-K) 10 MEQ CR capsule TAKE 2 CAPSULES BY MOUTH EVERY DAY 11/22/18  Yes [name], MD  predniSONE (DELTASONE) 5 MG tablet Take 1 tablet (5 mg total) by mouth daily with breakfast. Patient taking differently: Take 2.5 mg by mouth daily with breakfast.  12/23/16  Yes Mody, Ulice Bold, MD  Probiotic Product (ALIGN PO) Take 1 tablet by mouth daily.   Yes [provider]  simvastatin (ZOCOR) 20 MG tablet TAKE 1 TABLET DAILY 07/18/18  Yes [name], MD  sodium chloride (OCEAN) 0.65 % nasal spray Place 1 spray into the nose as needed for congestion.   Yes [provider]  triamcinolone cream (KENALOG) 0.1 % Apply 1 application topically 2 (two) times daily. Prn 12/21/17  Yes McLean-Scocuzza, Nino Glow, MD  acidophilus (RISAQUAD) CAPS capsule Take 1 capsule by mouth daily.    [provider]  aspirin EC 81 MG tablet Take 81 mg by mouth daily.    [provider]  busPIRone (BUSPAR) 5 MG tablet Take 1 tablet (5 mg total) by mouth at bedtime as needed. Patient not taking: Reported on 02/27/2019 07/12/18   [name], MD  FeFum-FePo-FA-B Cmp-C-Zn-Mn-Cu (TANDEM PLUS) 162-115.2-1 MG CAPS Take 1 capsule by mouth daily. Patient not taking: Reported on 02/27/2019 05/21/18   [name], MD  guaiFENesin-dextromethorphan [facility] DM) 100-10 MG/5ML syrup Take 5 mLs by mouth every 4 (four) hours as needed for cough. Patient not taking: Reported on 08/03/2018 08/07/15   Theodoro Grist, MD  hydroxychloroquine (PLAQUENIL) 200 MG tablet Take 200 mg by mouth 2 (two) times daily.  03/27/17   [provider]  hydrOXYzine (ATARAX/VISTARIL) 25 MG tablet Take 1 tablet (25 mg total) by mouth daily as needed. If you take at night do not take with xanax Patient not taking: Reported on 02/27/2019 01/07/19   [name], MD  traMADol (ULTRAM) 50 MG tablet Take 50 mg by mouth 2 (two) times daily as needed for moderate pain.    [provider]      VITAL SIGNS:  Blood pressure (!) 105/53, pulse 87, temperature (!) 100.5 F (38.1 C), temperature source Oral, resp. rate (!) 22, height 5\' 5"  (1.651 m), weight 59 kg, SpO2 91 %.  PHYSICAL EXAMINATION:  Physical Exam  GENERAL:  78 y.o.-year-old Caucasian female patient lying in the bed with no acute distress.  EYES: Pupils equal, round, reactive to light and accommodation. No scleral icterus. Extraocular muscles intact.  HEENT: Head atraumatic, normocephalic. Oropharynx and nasopharynx clear.  NECK:  Supple, no jugular venous distention. No thyroid enlargement, no tenderness.  LUNGS:  Normal breath sounds bilaterally, no wheezing, rales,rhonchi or crepitation. No use of accessory muscles of respiration.  CARDIOVASCULAR: Irregularly irregular rhythm, S1, S2 normal. No murmurs, rubs, or gallops.  ABDOMEN: Soft, nondistended, nontender. Bowel sounds present. No organomegaly or mass.  EXTREMITIES: No pedal edema, cyanosis, or clubbing.  NEUROLOGIC: Cranial nerves II through XII are intact. Muscle strength 5/5 in all extremities. Sensation intact. Gait not checked.  Musculoskeletal: Minimal left knee swelling with mild decreased range of motion due to pain. PSYCHIATRIC: The patient is alert and oriented x 3.  Normal affect and good eye contact. SKIN: No obvious rash, lesion, or ulcer.   LABORATORY PANEL:   CBC Recent Labs  Lab 02/27/19 0355  WBC 11.3*  HGB 11.2*  HCT 32.9*  PLT 230   ------------------------------------------------------------------------------------------------------------------  Chemistries  Recent Labs  Lab 02/27/19 0355  NA 132*  K 3.2*  CL 97*  CO2 28  GLUCOSE 115*  BUN 14  CREATININE 0.89  CALCIUM 8.7*  AST 42*  ALT 33  ALKPHOS 45  BILITOT 0.9   ------------------------------------------------------------------------------------------------------------------  Cardiac Enzymes No results for input(s): TROPONINI in the last 168 hours. ------------------------------------------------------------------------------------------------------------------  RADIOLOGY:  Dg Chest Port 1 View  Result Date: 02/27/2019 CLINICAL DATA:  Shortness of breath EXAM: PORTABLE CHEST 1 VIEW COMPARISON:  07/23/2018 FINDINGS: The heart size and mediastinal contours are within normal limits. Both lungs are clear. The visualized skeletal structures are unremarkable. IMPRESSION: No active disease. Electronically Signed   By: Kathreen Devoid   On: 02/27/2019 03:59   Dg Knee Complete 4 Views Left  Result Date: 02/27/2019 CLINICAL DATA:  Bilateral knee pain status  post fall EXAM: LEFT KNEE - COMPLETE 4+ VIEW COMPARISON:  None. FINDINGS: No acute fracture or dislocation. No aggressive osseous lesion. No joint effusion. 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 Small medial femorotibial compartment marginal osteophytes. IMPRESSION: No acute osseous injury of the left knee. Electronically Signed   By: Kathreen Devoid   On: 02/27/2019 03:59   Dg Knee Complete 4 Views Right  Result Date: 02/27/2019 CLINICAL DATA:  Bilateral knee pain status post fall EXAM: RIGHT KNEE - COMPLETE 4+ VIEW COMPARISON:  None. FINDINGS: No acute fracture or dislocation. No aggressive osseous lesion. Large joint effusion. 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 Mild osteoarthritis of the medial femorotibial compartment with mild joint space narrowing and marginal osteophytes. IMPRESSION: 1.  No acute osseous injury of the right knee. 2. Large joint effusion. Electronically Signed   By: Kathreen Devoid   On: 02/27/2019 04:01      IMPRESSION AND PLAN:   1.  Atrial flutter/ fibrillation with rapid ventricular response.  The patient will be admitted to a telemetry bed.  Will follow serial troponins and obtain a 2D echo as well as a cardiology consultation with Dr. Clayborn Bigness who was notified about the patient.  We will continue the patient on IV Cardizem drip.  We will continue Eliquis.  2.  Low-grade fever in the setting of bilateral knee pain more on the left status post left knee arthrocentesis.  Will follow joint fluid studies and continue the patient on IV vancomycin, cefepime and Flagyl.  I doubt septic arthritis.  3.  Rheumatoid arthritis.  This is apparently in remission.  We will continue Plaquenil and prednisone.  4.  Dyslipidemia.  We will continue statin therapy.  5.  DVT prophylaxis.  Will continue Eliquis.    All the records are reviewed and case discussed with ED  provider. The plan of care was discussed in details with the patient (and family). I answered all questions.  The patient agreed to proceed with the above mentioned plan. Further management will depend upon hospital course.   CODE STATUS: Full code  TOTAL TIME TAKING CARE OF THIS PATIENT: 55 minutes.    Christel Mormon M.D on 02/27/2019 at 6:48 AM  Pager - [phone]  After 6pm go to [email or web address] - Proofreader  [facility]  Office  [phone]  CC: Primary care physician; [name], MD   Note: This dictation was prepared with Dragon dictation along with smaller phrase technology. Any transcriptional errors that result from this process are unintentional.

## 2019-02-27 NOTE — ED Notes (Signed)
ED TO INPATIENT HANDOFF REPORT  ED Nurse Name and Phone #: Jalesia Loudenslager, RN   S Name/Age/Gender Kaitlyn Huff 78 y.o. female Room/Bed: ED25A/ED25A  Code Status   Code Status: Full Code  Home/SNF/Other Home Patient oriented to: self, place, time and situation Is this baseline? Yes   Triage Complete: Triage complete  Chief Complaint Syncope  Triage Note Pt walked into her recliner, struck R knee and fell to the floor, injuring lower back. Pt rose to go the bathroom tonight and pt became weak and near syncopal tonight. Daughter was able to ease pt to floor w/o injury. Pt reports to EMS that she's been having recent exacerbation of a-fib for approximately 3 days. Pt was unable to get her usual pain injection for RA because she is in uncontrolled a-fib.   Allergies Allergies  Allergen Reactions  . Tramadol Itching  . Astelin [Azelastine Hcl] Other (See Comments)    Reaction:  Unknown   . Codeine Other (See Comments)    Reaction:  Altered mental status  . Dilaudid [Hydromorphone] Other (See Comments)    Reaction:  Unknown   . Flexeril [Cyclobenzaprine] Other (See Comments)    Reaction:  Unknown   . Imuran [Azathioprine] Other (See Comments)    Reaction:  Abnormal liver function  . Lisinopril Itching  . Lyrica [Pregabalin] Other (See Comments)    Reaction:  Sore gums   . Methotrexate Derivatives Other (See Comments)    Reaction:  Abnormal liver function  . Amiodarone Anxiety and Other (See Comments)    Pt could not eat or sleep. Caused n/v and "messed her up"  . Amoxicillin Rash and Other (See Comments)    Unable to obtain enough information to answer additional questions about this medication.    Jolee Ewing [Leflunomide] Rash  . Clindamycin/Lincomycin Rash  . Doxycycline Rash  . Lodine [Etodolac] Rash  . Percocet [Oxycodone-Acetaminophen] Rash  . Sulfa Antibiotics Rash    Level of Care/Admitting Diagnosis ED Disposition    ED Disposition Condition [facility]: [place] [[id number]]  Level of Care: Telemetry [5]  Covid Evaluation: Confirmed COVID Negative  Diagnosis: Atrial fibrillation with rapid ventricular response [facility]) [[id number]]  Admitting Physician: Christel Mormon [[id number]]  Attending Physician: Christel Mormon [[id number]]  Estimated length of stay: past midnight tomorrow  Certification:: I certify this patient will need inpatient services for at least 2 midnights  PT Class (Do Not Modify): Inpatient [101]  PT Acc Code (Do Not Modify): Private [1]       B Medical/Surgery History Past Medical History:  Diagnosis Date  . Anemia   . Cancer ([place])   . Diverticulitis   . GERD (gastroesophageal reflux disease)   . Hyperlipidemia   . Hypertension   . Osteoarthritis   . Osteoporosis    actonel  . Positive PPD    s/p INH (2006)  . Rheumatoid arthritis(714.0)    MTX transaminitis, Leflunomide (rash), enbrel, plaquinil, prednisone, remicade, Imuran (transaminitis)  . 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 ABDOMINAL HYSTERECTOMY    . CERVICAL CONE BIOPSY    . CHOLECYSTECTOMY  06/22/14  . COLONOSCOPY WITH PROPOFOL N/A 07/09/2015   Procedure: COLONOSCOPY WITH PROPOFOL;  Surgeon: Manya Silvas, MD;  Location: [facility] ENDOSCOPY;  Service: Endoscopy;  Laterality: N/A;  . ESOPHAGOGASTRODUODENOSCOPY (EGD) WITH PROPOFOL N/A 07/09/2015   Procedure: ESOPHAGOGASTRODUODENOSCOPY (EGD) WITH PROPOFOL;  Surgeon:  Manya Silvas, MD;  Location: [facility] ENDOSCOPY;  Service: Endoscopy;  Laterality: N/A;  . OVARY SURGERY    . TRACHEOSTOMY  1959  . TUBAL LIGATION       A IV Location/Drains/Wounds Patient Lines/Drains/Airways Status   Active Line/Drains/Airways    Name:   Placement date:   Placement time:   Site:   Days:   Peripheral IV 02/27/19 Right Wrist   02/27/19    0359    Wrist   less than 1   Peripheral IV 02/27/19 Right Antecubital   02/27/19    0400     Antecubital   less than 1          Intake/Output Last 24 hours  Intake/Output Summary (Last 24 hours) at 02/27/2019 9024 Last data filed at 02/27/2019 0530 Gross per 24 hour  Intake -  Output 200 ml  Net -200 ml    Labs/Imaging Results for orders placed or performed during the hospital encounter of 02/27/19 (from the past 48 hour(s))  Lactic acid, plasma     Status: None   Collection Time: 02/27/19  3:55 AM  Result Value Ref Range   Lactic Acid, Venous 1.1 0.5 - 1.9 mmol/L    Comment: Performed at [facility], [address] [place], [place] [address]  Comprehensive metabolic panel     Status: Abnormal   Collection Time: 02/27/19  3:55 AM  Result Value Ref Range   Sodium 132 (L) 135 - 145 mmol/L   Potassium 3.2 (L) 3.5 - 5.1 mmol/L   Chloride 97 (L) 98 - 111 mmol/L   CO2 28 22 - 32 mmol/L   Glucose, Bld 115 (H) 70 - 99 mg/dL   BUN 14 8 - 23 mg/dL   Creatinine, Ser 0.89 0.44 - 1.00 mg/dL   Calcium 8.7 (L) 8.9 - 10.3 mg/dL   Total Protein 7.4 6.5 - 8.1 g/dL   Albumin 3.9 3.5 - 5.0 g/dL   AST 42 (H) 15 - 41 U/L   ALT 33 0 - 44 U/L   Alkaline Phosphatase 45 38 - 126 U/L   Total Bilirubin 0.9 0.3 - 1.2 mg/dL   GFR calc non Af Amer >60 >60 mL/min   GFR calc Af Amer >60 >60 mL/min   Anion gap 7 5 - 15    Comment: Performed at [facility], [address] [place], [place] [address]  CBC WITH DIFFERENTIAL     Status: Abnormal   Collection Time: 02/27/19  3:55 AM  Result Value Ref Range   WBC 11.3 (H) 4.0 - 10.5 K/uL   RBC 3.62 (L) 3.87 - 5.11 MIL/uL   Hemoglobin 11.2 (L) 12.0 - 15.0 g/dL   HCT 32.9 (L) 36.0 - 46.0 %   MCV 90.9 80.0 - 100.0 fL   MCH 30.9 26.0 - 34.0 pg   MCHC 34.0 30.0 - 36.0 g/dL   RDW 12.3 11.5 - 15.5 %   Platelets 230 150 - 400 K/uL   nRBC 0.0 0.0 - 0.2 %   Neutrophils Relative % 74 %   Neutro Abs 8.2 (H) 1.7 - 7.7 K/uL   Lymphocytes Relative 14 %   Lymphs Abs 1.6 0.7 - 4.0 K/uL   Monocytes Relative 12 %   Monocytes Absolute  1.4 (H) 0.1 - 1.0 K/uL   Eosinophils Relative 0 %   Eosinophils Absolute 0.1 0.0 - 0.5 K/uL   Basophils Relative 0 %   Basophils Absolute 0.0 0.0 - 0.1 K/uL  Immature Granulocytes 0 %   Abs Immature Granulocytes 0.03 0.00 - 0.07 K/uL    Comment: Performed at [facility], [address] [place], [place] [address]  APTT     Status: None   Collection Time: 02/27/19  3:55 AM  Result Value Ref Range   aPTT 32 24 - 36 seconds    Comment: Performed at [facility], [address] [place], [place] [address]  Protime-INR     Status: Abnormal   Collection Time: 02/27/19  3:55 AM  Result Value Ref Range   Prothrombin Time 18.8 (H) 11.4 - 15.2 seconds   INR 1.6 (H) 0.8 - 1.2    Comment: (NOTE) INR goal varies based on device and disease states. Performed at [facility], [address] [place], [place] [address]   Urinalysis, Routine w reflex microscopic     Status: Abnormal   Collection Time: 02/27/19  3:55 AM  Result Value Ref Range   Color, Urine YELLOW (A) YELLOW   APPearance CLEAR (A) CLEAR   Specific Gravity, Urine 1.009 1.005 - 1.030   pH 8.0 5.0 - 8.0   Glucose, UA NEGATIVE NEGATIVE mg/dL   Hgb urine dipstick NEGATIVE NEGATIVE   Bilirubin Urine NEGATIVE NEGATIVE   Ketones, ur NEGATIVE NEGATIVE mg/dL   Protein, ur NEGATIVE NEGATIVE mg/dL   Nitrite NEGATIVE NEGATIVE   Leukocytes,Ua NEGATIVE NEGATIVE    Comment: Performed at [facility], [address], [place],  [address]  SARS Coronavirus 2 [facility] order, Performed in [facility] hospital lab) Nasopharyngeal Urine, Clean Catch     Status: None   Collection Time: 02/27/19  3:56 AM   Specimen: Urine, Clean Catch; Nasopharyngeal  Result Value Ref Range   SARS Coronavirus 2 NEGATIVE NEGATIVE    Comment: (NOTE) If result is NEGATIVE SARS-CoV-2 target nucleic acids are NOT DETECTED. The SARS-CoV-2 RNA is generally detectable in upper and lower  respiratory specimens during the  acute phase of infection. The lowest  concentration of SARS-CoV-2 viral copies this assay can detect is 250  copies / mL. A negative result does not preclude SARS-CoV-2 infection  and should not be used as the sole basis for treatment or other  patient management decisions.  A negative result may occur with  improper specimen collection / handling, submission of specimen other  than nasopharyngeal swab, presence of viral mutation(s) within the  areas targeted by this assay, and inadequate number of viral copies  (<250 copies / mL). A negative result must be combined with clinical  observations, patient history, and epidemiological information. If result is POSITIVE SARS-CoV-2 target nucleic acids are DETECTED. The SARS-CoV-2 RNA is generally detectable in upper and lower  respiratory specimens dur ing the acute phase of infection.  Positive  results are indicative of active infection with SARS-CoV-2.  Clinical  correlation with patient history and other diagnostic information is  necessary to determine patient infection status.  Positive results do  not rule out bacterial infection or co-infection with other viruses. If result is PRESUMPTIVE POSTIVE SARS-CoV-2 nucleic acids MAY BE PRESENT.   A presumptive positive result was obtained on the submitted specimen  and confirmed on repeat testing.  While 2019 novel coronavirus  (SARS-CoV-2) nucleic acids may be present in the submitted sample  additional confirmatory testing may be necessary for epidemiological  and / or clinical management purposes  to differentiate between  SARS-CoV-2 and other Sarbecovirus currently known to infect humans.  If clinically indicated additional testing with  an alternate test  methodology [phone]) is advised. The SARS-CoV-2 RNA is generally  detectable in upper and lower respiratory sp ecimens during the acute  phase of infection. The expected result is Negative. Fact Sheet for Patients:   StrictlyIdeas.no Fact Sheet for Healthcare Providers: BankingDealers.co.za This test is not yet approved or cleared by the [place] FDA and has been authorized for detection and/or diagnosis of SARS-CoV-2 by FDA under an Emergency Use Authorization (EUA).  This EUA will remain in effect (meaning this test can be used) for the duration of the COVID-19 declaration under Section 564(b)(1) of the Act, 21 U.S.C. section 360bbb-3(b)(1), unless the authorization is terminated or revoked sooner. Performed at [facility], [address] [place], [place] [address]   Lactic acid, plasma     Status: None   Collection Time: 02/27/19  5:55 AM  Result Value Ref Range   Lactic Acid, Venous 0.9 0.5 - 1.9 mmol/L    Comment: Performed at [facility], [address] [place], [place] [address]   Dg Chest Port 1 View  Result Date: 02/27/2019 CLINICAL DATA:  Shortness of breath EXAM: PORTABLE CHEST 1 VIEW COMPARISON:  07/23/2018 FINDINGS: The heart size and mediastinal contours are within normal limits. Both lungs are clear. The visualized skeletal structures are unremarkable. IMPRESSION: No active disease. Electronically Signed   By: Kathreen Devoid   On: 02/27/2019 03:59   Dg Knee Complete 4 Views Left  Result Date: 02/27/2019 CLINICAL DATA:  Bilateral knee pain status post fall EXAM: LEFT KNEE - COMPLETE 4+ VIEW COMPARISON:  None. FINDINGS: No acute fracture or dislocation. No aggressive osseous lesion. No joint effusion. 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 Small medial femorotibial compartment marginal osteophytes. IMPRESSION: No acute osseous injury of the left knee. Electronically Signed   By: Kathreen Devoid   On: 02/27/2019 03:59   Dg Knee Complete 4 Views Right  Result Date: 02/27/2019 CLINICAL DATA:  Bilateral knee pain status post fall EXAM: RIGHT KNEE - COMPLETE 4+  VIEW COMPARISON:  None. FINDINGS: No acute fracture or dislocation. No aggressive osseous lesion. Large joint effusion. 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 Mild osteoarthritis of the medial femorotibial compartment with mild joint space narrowing and marginal osteophytes. IMPRESSION: 1.  No acute osseous injury of the right knee. 2. Large joint effusion. Electronically Signed   By: Kathreen Devoid   On: 02/27/2019 04:01    Pending Labs Unresulted Labs (From admission, onward)    Start     Ordered   02/28/19 0500  Protime-INR  Tomorrow morning,   STAT     02/27/19 0620   02/28/19 0500  Cortisol-am, blood  Tomorrow morning,   STAT     02/27/19 0620   02/28/19 0500  Procalcitonin  Tomorrow morning,   STAT     02/27/19 0620   02/28/19 7672  Basic metabolic panel  Tomorrow morning,   STAT     02/27/19 0620   02/28/19 0500  CBC  Tomorrow morning,   STAT     02/27/19 0620   02/27/19 0539  Body fluid culture  (Arthrocentesis Panel)  ONCE - STAT,   STAT    Question Answer Comment  Are there also cytology or pathology orders on this specimen? No   Patient immune status Immunocompromised      02/27/19 0538   02/27/19 0539  Glucose, Body Fluid Other  (  Arthrocentesis Panel)  ONCE - STAT,   STAT     02/27/19 0538   02/27/19 0539  Protein, body fluid (other)  (Arthrocentesis Panel)  ONCE - STAT,   STAT     02/27/19 0538   02/27/19 0539  Synovial cell count + diff, w/ crystals  (Arthrocentesis Panel)  ONCE - STAT,   STAT     02/27/19 0538   02/27/19 0330  Blood Culture (routine x 2)  BLOOD CULTURE X 2,   STAT     02/27/19 0329   02/27/19 0330  Urine culture  ONCE - STAT,   STAT     02/27/19 0329          Vitals/Pain Today's Vitals   02/27/19 0530 02/27/19 0545 02/27/19 0600 02/27/19 0615  BP: 111/69 105/61 (!) 107/58 (!) 95/59  Pulse: (!) 109 94 (!) 44 84  Resp: (!) 24 19 20  (!) 25  Temp:      TempSrc:      SpO2: 95% 93% 92% 94%   Weight:      Height:      PainSc:        Isolation Precautions No active isolations  Medications Medications  diltiazem (CARDIZEM) 1 mg/mL load via infusion 10 mg (10 mg Intravenous Bolus from Bag 02/27/19 0504)    And  diltiazem (CARDIZEM) 100 mg in dextrose 5 % 100 mL (1 mg/mL) infusion (5 mg/hr Intravenous New Bag/Given 02/27/19 0502)  vancomycin (VANCOCIN) 1,250 mg in sodium chloride 0.9 % 250 mL IVPB (1,250 mg Intravenous New Bag/Given 02/27/19 0601)  enoxaparin (LOVENOX) injection 40 mg (has no administration in time range)  0.9 %  sodium chloride infusion (has no administration in time range)  metroNIDAZOLE (FLAGYL) IVPB 500 mg (has no administration in time range)  acetaminophen (TYLENOL) tablet 650 mg (has no administration in time range)    Or  acetaminophen (TYLENOL) suppository 650 mg (has no administration in time range)  traZODone (DESYREL) tablet 25 mg (has no administration in time range)  magnesium hydroxide (MILK OF MAGNESIA) suspension 30 mL (has no administration in time range)  ondansetron (ZOFRAN) tablet 4 mg (has no administration in time range)    Or  ondansetron (ZOFRAN) injection 4 mg (has no administration in time range)  ceFEPIme (MAXIPIME) 2 g in sodium chloride 0.9 % 100 mL IVPB (has no administration in time range)  vancomycin (VANCOCIN) IVPB 1000 mg/200 mL premix (has no administration in time range)  ceFEPIme (MAXIPIME) 2 g in sodium chloride 0.9 % 100 mL IVPB (0 g Intravenous Stopped 02/27/19 0440)  metroNIDAZOLE (FLAGYL) IVPB 500 mg (0 mg Intravenous Stopped 02/27/19 0600)  acetaminophen (TYLENOL) tablet 650 mg (650 mg Oral Given 02/27/19 0407)  sodium chloride 0.9 % bolus 500 mL (0 mLs Intravenous Stopped 02/27/19 0443)  ondansetron (ZOFRAN) injection 4 mg (4 mg Intravenous Given 02/27/19 0421)  lidocaine (XYLOCAINE) 2 % (with pres) injection 200 mg (200 mg Intradermal Given 02/27/19 5027)    Mobility walks Moderate fall risk   Focused  Assessments Cardiac Assessment Handoff:  Cardiac Rhythm: Atrial fibrillation Lab Results  Component Value Date   CKTOTAL 138 11/01/2014   CKMB 2.9 11/01/2014   TROPONINI 0.08 (HH) 06/24/2017   No results found for: DDIMER Does the Patient currently have chest pain? No     R Recommendations: See Admitting Provider Note  Report given to:   Additional Notes:  Pt is AOx4, ECG reveals atrial fibb, on cardizem gtt at 5mg /hr, joint aspirate from left  knee has been sent to lab for further evaluation. Daughter is at the bedside. Covid test is negative.

## 2019-02-27 NOTE — Progress Notes (Signed)
Pharmacy Antibiotic Note  Kaitlyn Huff is a 78 y.o. female admitted on 02/27/2019 with sepsis.  Pharmacy has been consulted for Vancomycin and Cefepime dosing.  Pt received initial doses in ED  Plan: Changed Cefepime to 2gm IV q12h  Vancomycin 1250mg  x 1 then 1000mg  IV Q 24 hrs. Goal AUC 400-550. Expected AUC: 520.9 SCr used: 0.89   Height: 5\' 4"  (162.6 cm) Weight: 138 lb 1.6 oz (62.6 kg) IBW/kg (Calculated) : 54.7  Temp (24hrs), Avg:99.8 F (37.7 C), Min:99 F (37.2 C), Max:100.5 F (38.1 C)  Recent Labs  Lab 02/27/19 0355 02/27/19 0555  WBC 11.3*  --   CREATININE 0.89  --   LATICACIDVEN 1.1 0.9    Estimated Creatinine Clearance: 47.2 mL/min (by C-G formula based on SCr of 0.89 mg/dL).    Allergies  Allergen Reactions  . Tramadol Itching  . Astelin [Azelastine Hcl] Other (See Comments)    Reaction:  Unknown   . Codeine Other (See Comments)    Reaction:  Altered mental status  . Dilaudid [Hydromorphone] Other (See Comments)    Reaction:  Unknown   . Flexeril [Cyclobenzaprine] Other (See Comments)    Reaction:  Unknown   . Imuran [Azathioprine] Other (See Comments)    Reaction:  Abnormal liver function  . Lisinopril Itching  . Lyrica [Pregabalin] Other (See Comments)    Reaction:  Sore gums   . Methotrexate Derivatives Other (See Comments)    Reaction:  Abnormal liver function  . Amiodarone Anxiety and Other (See Comments)    Pt could not eat or sleep. Caused n/v and "messed her up"  . Amoxicillin Rash and Other (See Comments)    Unable to obtain enough information to answer additional questions about this medication.    Jolee Ewing [Leflunomide] Rash  . Clindamycin/Lincomycin Rash  . Doxycycline Rash  . Lodine [Etodolac] Rash  . Percocet [Oxycodone-Acetaminophen] Rash  . Sulfa Antibiotics Rash   Antimicrobials this admission: Vancomycin 8/19 >>  Cefepime 8/19 >>  Flagyl 8/19 >>  Dose adjustments this admission: Cefepime from 2g q8h to 2g  q12h  Microbiology results: 8/19 BCx: pending 8/19 UCx: pending  8/19 body fluid: No organisms, Abundant WBC's, culture pending   Thank you for allowing pharmacy to be a part of this patient's care.  Lu Duffel, PharmD, BCPS Clinical Pharmacist 02/27/2019 2:11 PM

## 2019-02-27 NOTE — Progress Notes (Signed)
CODE SEPSIS - PHARMACY COMMUNICATION  **Broad Spectrum Antibiotics should be administered within 1 hour of Sepsis diagnosis**  Time Code Sepsis Called/Page Received: 0339  Antibiotics Ordered: Cefepime and Vancomycin  Time of 1st antibiotic administration: 0410  Additional action taken by pharmacy:   If necessary, Name of Provider/Nurse Contacted:    Ena Dawley ,PharmD Clinical Pharmacist  02/27/2019  4:45 AM

## 2019-02-27 NOTE — Progress Notes (Signed)
PHARMACIST - PHYSICIAN COMMUNICATION  CONCERNING: P&T Medication Policy Regarding Oral Bisphosphonates  RECOMMENDATION: Your order for alendronate (Fosamax), ibandronate (Boniva), or risedronate (Actonel) has been discontinued at this time.  If the patient's post-hospital medical condition warrants safe use of this class of drugs, please resume the pre-hospital regimen upon discharge.  DESCRIPTION:  Alendronate (Fosamax), ibandronate (Boniva), and risedronate (Actonel) can cause severe esophageal erosions in patients who are unable to remain upright at least 30 minutes after taking this medication.   Since brief interruptions in therapy are thought to have minimal impact on bone mineral density, the [place] has established that bisphosphonate orders should be routinely discontinued during hospitalization.   To override this safety policy and permit administration of Boniva, Fosamax, or Actonel in the hospital, prescribers must write "DO NOT HOLD" in the comments section when placing the order for this class of medications.  Chinita Greenland PharmD Clinical Pharmacist 02/27/2019

## 2019-02-27 NOTE — Progress Notes (Signed)
Pharmacy - Brief Note  Antibiotics (vanco/cefepime/metronidazole) narrowed to levofloxacin today.  Note synovial fluid from knee with elevated WBC 30.5K.  Also has history of rheumatoid arthritis.  Has fever 100.5 at admission with mildly elevated WBC on CBC.    For rule out possible septic joint - resume vancomycin 1gm IV q12h (see pharmacist note from 06:37 today) - ceftriaxone 2gm IV q24h - Stop levofloxacin  Doreene Eland, PharmD, BCPS.   Work Cell: [phone] 02/27/2019 4:57 PM

## 2019-02-27 NOTE — Progress Notes (Signed)
*  PRELIMINARY RESULTS* Echocardiogram 2D Echocardiogram has been performed.  Kaitlyn Huff 02/27/2019, 11:56 AM

## 2019-02-28 LAB — BASIC METABOLIC PANEL
Anion gap: 5 (ref 5–15)
BUN: 18 mg/dL (ref 8–23)
CO2: 24 mmol/L (ref 22–32)
Calcium: 7.8 mg/dL — ABNORMAL LOW (ref 8.9–10.3)
Chloride: 105 mmol/L (ref 98–111)
Creatinine, Ser: 0.85 mg/dL (ref 0.44–1.00)
GFR calc Af Amer: >60 mL/min (ref >60)
GFR calc non Af Amer: >60 mL/min (ref >60)
Glucose, Bld: 105 mg/dL — ABNORMAL HIGH (ref 70–99)
Potassium: 4.2 mmol/L (ref 3.5–5.1)
Sodium: 134 mmol/L — ABNORMAL LOW (ref 135–145)

## 2019-02-28 LAB — GLUCOSE, BODY FLUID OTHER: Glucose, Body Fluid Other: 46 mg/dL

## 2019-02-28 LAB — URINE CULTURE: Culture: <10000 — AB

## 2019-02-28 LAB — CBC
HCT: 29.4 % — ABNORMAL LOW (ref 36.0–46.0)
Hemoglobin: 9.7 g/dL — ABNORMAL LOW (ref 12.0–15.0)
MCH: 31.1 pg (ref 26.0–34.0)
MCHC: 33 g/dL (ref 30.0–36.0)
MCV: 94.2 fL (ref 80.0–100.0)
Platelets: 189 10*3/uL (ref 150–400)
RBC: 3.12 MIL/uL — ABNORMAL LOW (ref 3.87–5.11)
RDW: 12.6 % (ref 11.5–15.5)
WBC: 9.3 10*3/uL (ref 4.0–10.5)
nRBC: 0 % (ref 0.0–0.2)

## 2019-02-28 LAB — PROTIME-INR
INR: 1.9 — ABNORMAL HIGH (ref 0.8–1.2)
Prothrombin Time: 21.2 seconds — ABNORMAL HIGH (ref 11.4–15.2)

## 2019-02-28 LAB — CORTISOL-AM, BLOOD: Cortisol - AM: 9.7 ug/dL (ref 6.7–22.6)

## 2019-02-28 LAB — PROTEIN, BODY FLUID (OTHER): Total Protein, Body Fluid Other: 2.7 g/dL

## 2019-02-28 LAB — PROCALCITONIN
Procalcitonin: 0.32 ng/mL
Procalcitonin: 0.3 ng/mL

## 2019-02-28 MED ORDER — DILTIAZEM HCL-DEXTROSE 100-5 MG/100ML-% IV SOLN (PREMIX)
5.0000 mg/h | INTRAVENOUS | Status: DC
  Filled 2019-02-28: qty 100

## 2019-02-28 MED ORDER — DILTIAZEM HCL 100 MG IV SOLR
5.0000 mg/h | INTRAVENOUS | Status: DC
  Administered 2019-02-28: 5 mg/h via INTRAVENOUS
  Administered 2019-02-28 – 2019-03-01 (×2): 10 mg/h via INTRAVENOUS
  Administered 2019-03-01: 12.5 mg/h via INTRAVENOUS
  Administered 2019-03-01: 15 mg/h via INTRAVENOUS
  Administered 2019-03-01 (×2): 12.5 mg/h via INTRAVENOUS
  Administered 2019-03-02: 5 mg/h via INTRAVENOUS
  Administered 2019-03-02: 10 mg/h via INTRAVENOUS
  Filled 2019-02-28 (×6): qty 100

## 2019-02-28 MED ORDER — DILTIAZEM HCL ER COATED BEADS 120 MG PO CP24
240.0000 mg | ORAL_CAPSULE | Freq: Every day | ORAL | Status: DC
  Administered 2019-02-28 – 2019-03-03 (×4): 240 mg via ORAL
  Filled 2019-02-28 (×4): qty 2

## 2019-02-28 NOTE — Progress Notes (Signed)
Paged dr. Ubaldo Glassing again to make aware patient converted, was made aware by md that Dr Clayborn Bigness is following patient. Added dr. Clayborn Bigness to the care team and paged to make aware patient converted to nsr at [phone] currently on cardizem drip at 5

## 2019-02-28 NOTE — Consult Note (Signed)
Reason for Consult: Atrial fibrillation rapid ventricular response Referring Physician: Dr. [name] Kaitlyn Huff is an 78 y.o. female.  HPI: Patient is a 78 year old came to emergency room with acute onset of palpitations generalized weakness and dizziness.  Patient has been having pain bilaterally in the knee left more than right.  Patient bumped her knee on a cabinet about a week ago and had worsening pain she has had some dyspnea on exertion no chest pain she has had mild nausea no diarrhea no bleeding.  When she initially presented to the emergency room she was tachycardic to 120 which increased to about 140.  Blood pressure was okay but she was mildly hypoxic with a low-grade temperature.  She was given broad-spectrum antibiotic therapy and had arthrocentesis of her knee placed on IV Cardizem for rate control  Past Medical History:  Diagnosis Date  . Anemia   . Cancer ([place])   . Diverticulitis   . GERD (gastroesophageal reflux disease)   . Hyperlipidemia   . Hypertension   . Osteoarthritis   . Osteoporosis    actonel  . Positive PPD    s/p INH (2006)  . Rheumatoid arthritis(714.0)    MTX transaminitis, Leflunomide (rash), enbrel, plaquinil, prednisone, remicade, Imuran (transaminitis)  . Valvular heart disease    moderate MR and TR    Past Surgical History:  Procedure Laterality Date  . ABDOMINAL HYSTERECTOMY    . CERVICAL CONE BIOPSY    . CHOLECYSTECTOMY  06/22/14  . COLONOSCOPY WITH PROPOFOL N/A 07/09/2015   Procedure: COLONOSCOPY WITH PROPOFOL;  Surgeon: Manya Silvas, MD;  Location[facility] ENDOSCOPY;  Service: Endoscopy;  Laterality: N/A;  . ESOPHAGOGASTRODUODENOSCOPY (EGD) WITH PROPOFOL N/A 07/09/2015   Procedure: ESOPHAGOGASTRODUODENOSCOPY (EGD) WITH PROPOFOL;  Surgeon: Manya Silvas, MD;  Location: [facility] ENDOSCOPY;  Service: Endoscopy;  Laterality: N/A;  . OVARY SURGERY    . TRACHEOSTOMY  1959  . TUBAL LIGATION      Family History  Problem Relation Age of  Onset  . Heart disease Father        MI  . Heart disease Mother   . Valvular heart disease Mother   . Breast cancer Sister 72  . Cancer Sister        Lung cancer  . COPD Sister   . Heart disease Sister   . Colon cancer Neg Hx     Social History:  reports that she has never smoked. She has never used smokeless tobacco. She reports that she does not drink alcohol or use drugs.  Allergies:  Allergies  Allergen Reactions  . Tramadol Itching  . Astelin [Azelastine Hcl] Other (See Comments)    Reaction:  Unknown   . Codeine Other (See Comments)    Reaction:  Altered mental status  . Dilaudid [Hydromorphone] Other (See Comments)    Reaction:  Unknown   . Flexeril [Cyclobenzaprine] Other (See Comments)    Reaction:  Unknown   . Imuran [Azathioprine] Other (See Comments)    Reaction:  Abnormal liver function  . Lisinopril Itching  . Lyrica [Pregabalin] Other (See Comments)    Reaction:  Sore gums   . Methotrexate Derivatives Other (See Comments)    Reaction:  Abnormal liver function  . Amiodarone Anxiety and Other (See Comments)    Pt could not eat or sleep. Caused n/v and "messed her up"  . Amoxicillin Rash and Other (See Comments)    Unable to obtain enough information to answer additional questions about this medication.    Marland Kitchen  Arava [Leflunomide] Rash  . Clindamycin/Lincomycin Rash  . Doxycycline Rash  . Lodine [Etodolac] Rash  . Percocet [Oxycodone-Acetaminophen] Rash  . Sulfa Antibiotics Rash    Medications: I have reviewed the patient's current medications.  Results for orders placed or performed during the hospital encounter of 02/27/19 (from the past 48 hour(s))  Lactic acid, plasma     Status: None   Collection Time: 02/27/19  3:55 AM  Result Value Ref Range   Lactic Acid, Venous 1.1 0.5 - 1.9 mmol/L    Comment: Performed at [facility], [address] [place], [place] [address]  Comprehensive metabolic panel     Status: Abnormal   Collection Time:  02/27/19  3:55 AM  Result Value Ref Range   Sodium 132 (L) 135 - 145 mmol/L   Potassium 3.2 (L) 3.5 - 5.1 mmol/L   Chloride 97 (L) 98 - 111 mmol/L   CO2 28 22 - 32 mmol/L   Glucose, Bld 115 (H) 70 - 99 mg/dL   BUN 14 8 - 23 mg/dL   Creatinine, Ser 0.89 0.44 - 1.00 mg/dL   Calcium 8.7 (L) 8.9 - 10.3 mg/dL   Total Protein 7.4 6.5 - 8.1 g/dL   Albumin 3.9 3.5 - 5.0 g/dL   AST 42 (H) 15 - 41 U/L   ALT 33 0 - 44 U/L   Alkaline Phosphatase 45 38 - 126 U/L   Total Bilirubin 0.9 0.3 - 1.2 mg/dL   GFR calc non Af Amer >60 >60 mL/min   GFR calc Af Amer >60 >60 mL/min   Anion gap 7 5 - 15    Comment: Performed at [facility], [address] [place], [place] [address]  CBC WITH DIFFERENTIAL     Status: Abnormal   Collection Time: 02/27/19  3:55 AM  Result Value Ref Range   WBC 11.3 (H) 4.0 - 10.5 K/uL   RBC 3.62 (L) 3.87 - 5.11 MIL/uL   Hemoglobin 11.2 (L) 12.0 - 15.0 g/dL   HCT 32.9 (L) 36.0 - 46.0 %   MCV 90.9 80.0 - 100.0 fL   MCH 30.9 26.0 - 34.0 pg   MCHC 34.0 30.0 - 36.0 g/dL   RDW 12.3 11.5 - 15.5 %   Platelets 230 150 - 400 K/uL   nRBC 0.0 0.0 - 0.2 %   Neutrophils Relative % 74 %   Neutro Abs 8.2 (H) 1.7 - 7.7 K/uL   Lymphocytes Relative 14 %   Lymphs Abs 1.6 0.7 - 4.0 K/uL   Monocytes Relative 12 %   Monocytes Absolute 1.4 (H) 0.1 - 1.0 K/uL   Eosinophils Relative 0 %   Eosinophils Absolute 0.1 0.0 - 0.5 K/uL   Basophils Relative 0 %   Basophils Absolute 0.0 0.0 - 0.1 K/uL   Immature Granulocytes 0 %   Abs Immature Granulocytes 0.03 0.00 - 0.07 K/uL    Comment: Performed at [facility], [address] [place], [place] [address]  APTT     Status: None   Collection Time: 02/27/19  3:55 AM  Result Value Ref Range   aPTT 32 24 - 36 seconds    Comment: Performed at [facility], [address] [place], [place] [address]  Protime-INR     Status: Abnormal   Collection Time: 02/27/19  3:55 AM  Result Value Ref Range   Prothrombin Time  18.8 (H) 11.4 - 15.2 seconds   INR 1.6 (H) 0.8 - 1.2    Comment: (  NOTE) INR goal varies based on device and disease states. Performed at [facility], [address] [place], [place] [address]   Blood Culture (routine x 2)     Status: None (Preliminary result)   Collection Time: 02/27/19  3:55 AM   Specimen: BLOOD  Result Value Ref Range   Specimen Description BLOOD RIGHT ASSIST CONTROL    Special Requests      BOTTLES DRAWN AEROBIC AND ANAEROBIC Blood Culture results may not be optimal due to an excessive volume of blood received in culture bottles   Culture      NO GROWTH 1 DAY Performed at [facility], [address] [place], [place] [address]    Report Status PENDING   Urinalysis, Routine w reflex microscopic     Status: Abnormal   Collection Time: 02/27/19  3:55 AM  Result Value Ref Range   Color, Urine YELLOW (A) YELLOW   APPearance CLEAR (A) CLEAR   Specific Gravity, Urine 1.009 1.005 - 1.030   pH 8.0 5.0 - 8.0   Glucose, UA NEGATIVE NEGATIVE mg/dL   Hgb urine dipstick NEGATIVE NEGATIVE   Bilirubin Urine NEGATIVE NEGATIVE   Ketones, ur NEGATIVE NEGATIVE mg/dL   Protein, ur NEGATIVE NEGATIVE mg/dL   Nitrite NEGATIVE NEGATIVE   Leukocytes,Ua NEGATIVE NEGATIVE    Comment: Performed at [facility], [address] [place], [place] [address]  Blood Culture (routine x 2)     Status: None (Preliminary result)   Collection Time: 02/27/19  3:56 AM   Specimen: BLOOD  Result Value Ref Range   Specimen Description BLOOD RIGHT WRIST    Special Requests      BOTTLES DRAWN AEROBIC AND ANAEROBIC Blood Culture adequate volume   Culture      NO GROWTH 1 DAY Performed at [facility], [address], [place], [place] [address]    Report Status PENDING   Urine culture     Status: Abnormal   Collection Time: 02/27/19  3:56 AM   Specimen: Urine, Random  Result Value Ref Range   Specimen Description      URINE, RANDOM Performed at  [facility], [address] [place], [place] [address]    Special Requests      NONE Performed at [facility], [address] [place], [place] [address]    Culture (A)     <10,000 COLONIES/mL INSIGNIFICANT GROWTH Performed at [place] [address], [place], [place] [address]    Report Status 02/28/2019 FINAL   SARS Coronavirus 2 [facility] order, Performed in [facility] hospital lab) Nasopharyngeal Urine, Clean Catch     Status: None   Collection Time: 02/27/19  3:56 AM   Specimen: Urine, Clean Catch; Nasopharyngeal  Result Value Ref Range   SARS Coronavirus 2 NEGATIVE NEGATIVE    Comment: (NOTE) If result is NEGATIVE SARS-CoV-2 target nucleic acids are NOT DETECTED. The SARS-CoV-2 RNA is generally detectable in upper and lower  respiratory specimens during the acute phase of infection. The lowest  concentration of SARS-CoV-2 viral copies this assay can detect is 250  copies / mL. A negative result does not preclude SARS-CoV-2 infection  and should not be used as the sole basis for treatment or other  patient management decisions.  A negative result may occur with  improper specimen collection / handling, submission of specimen other  than nasopharyngeal swab, presence of viral mutation(s) within the  areas targeted by this assay, and inadequate number  of viral copies  (<250 copies / mL). A negative result must be combined with clinical  observations, patient history, and epidemiological information. If result is POSITIVE SARS-CoV-2 target nucleic acids are DETECTED. The SARS-CoV-2 RNA is generally detectable in upper and lower  respiratory specimens dur ing the acute phase of infection.  Positive  results are indicative of active infection with SARS-CoV-2.  Clinical  correlation with patient history and other diagnostic information is  necessary to determine patient infection status.  Positive results do  not rule out bacterial infection or  co-infection with other viruses. If result is PRESUMPTIVE POSTIVE SARS-CoV-2 nucleic acids MAY BE PRESENT.   A presumptive positive result was obtained on the submitted specimen  and confirmed on repeat testing.  While 2019 novel coronavirus  (SARS-CoV-2) nucleic acids may be present in the submitted sample  additional confirmatory testing may be necessary for epidemiological  and / or clinical management purposes  to differentiate between  SARS-CoV-2 and other Sarbecovirus currently known to infect humans.  If clinically indicated additional testing with an alternate test  methodology [phone]) is advised. The SARS-CoV-2 RNA is generally  detectable in upper and lower respiratory sp ecimens during the acute  phase of infection. The expected result is Negative. Fact Sheet for Patients:  StrictlyIdeas.no Fact Sheet for Healthcare Providers: BankingDealers.co.za This test is not yet approved or cleared by the [place] FDA and has been authorized for detection and/or diagnosis of SARS-CoV-2 by FDA under an Emergency Use Authorization (EUA).  This EUA will remain in effect (meaning this test can be used) for the duration of the COVID-19 declaration under Section 564(b)(1) of the Act, 21 U.S.C. section 360bbb-3(b)(1), unless the authorization is terminated or revoked sooner. Performed at [facility], [address] [place], [place] [address]   Lactic acid, plasma     Status: None   Collection Time: 02/27/19  5:55 AM  Result Value Ref Range   Lactic Acid, Venous 0.9 0.5 - 1.9 mmol/L    Comment: Performed at [facility], [address] [place], [place] [address]  Body fluid culture     Status: None (Preliminary result)   Collection Time: 02/27/19  5:55 AM   Specimen: Synovium; Body Fluid  Result Value Ref Range   Specimen Description      SYNOVIAL Performed at [facility], [address]  [place], [place] [address]    Special Requests      Immunocompromised Performed at [facility], [place], [place] [address]    Gram Stain      ABUNDANT WBC PRESENT, PREDOMINANTLY PMN NO ORGANISMS SEEN    Culture      NO GROWTH < 24 HOURS Performed at [place] [address] [place],  [address]    Report Status PENDING   Glucose, Body Fluid Other     Status: None   Collection Time: 02/27/19  5:55 AM  Result Value Ref Range   Glucose, Body Fluid Other 46 mg/dL    Comment: (NOTE)                            _________________________________________                           : BODY FLUID TYPE :        GLUCOSE : :_________________:_______________________: : Amniotic Fluid  :  45 - 76        :  :_________________:_______________________:   : Bile, Clear     :            < 5        :    :_________________:_______________________:     : Bile, Yellow    :            < 8        :      :_________________:_______________________:       : Lymph           :        48 - 200       :        :_________________:_______________________:         : Nasal Secretion :            < 10       :          :_________________:_______________________:           : Pleural Fluid   :        65 -  99       :            :_________________:_______________________:             : Saliva          :             <  2      :              : (Mixed Glands   :                       :               :_________________:_______________________:                : Sweat            :            <  7       :                 :_________________:_______________________:                  : Synovial Fluid  :        65 -  99       :                   :_________________:_______________________:                    : Tears           :        76 - 288       :                     :_________________:_______________________:                            Louanne Skye, Ehrhardt V. Reference Intervals                             for Adults and Children 2008. Ninth  edition (V9.1) [place],                            [place]; [place]: July 2009. The reference intervals and other method performance specifications have not been established for this test. The test result should be integrated into the clinical context for interpretation. Performed At: [facility] [place], [place] [id number] Rush Farmer MD YT:[id number]    Source of Sample SYNOVIAL     Comment: Performed at [facility], [address] [place], [place] [address]  Protein, body fluid (other)     Status: None   Collection Time: 02/27/19  5:55 AM  Result Value Ref Range   Total Protein, Body Fluid Other 2.7 g/dL    Comment: (NOTE)                            _________________________________________                           : BODY FLUID TYPE :     TOTAL PROTEIN     :                           :_________________:_______________________:                           : Amniotic Fluid  :               <0.4    :                           :_________________:_______________________:                           :                 : Nonmalignant: <3.0    :                           : Ascitic Fluid   : Malignant:    >3.0    :                           :_________________:_______________________:                           : Bile, Clear     :               <0.9    :                           :_________________:_______________________:                           : Bile, Yellow    :          0.2 - 0.6    :                           :_________________:_______________________:                           :  Lymph           :          2.2 - 6.0    :                           :_________________:_______________________:                            : Human Milk      :          1.9 - 2.0    :                           :_________________: ______________________:                            : Nasal Secretion :          0.1 - 3.5    :                           :_________________:_______________________:                           : Pancreatic      :          0.0 - 0.1    :                           : Juice           :    (post stimulation) :                           :_________________:_______________________:                           :                 : Transudate:   <0.3    :                           : Pleural Fluid   : Exudate:      >0.3    :                           :_________________:_______________________:                           : Saliva          :          0.1 - 0.2    :                           : (Mixed Glands)  :                       :                           :_________________:_______________________:                           :  Synovial Fluid  :               <2.5    :                            :_________________:_______________________:                           : Tears           :          0.8 - 0.9    :                           :_________________:_______________________:                            Louanne Skye, Ehrhardt V. Reference Intervals                            for  Adults and Children 2008. Ninth                            Edition (V9.1) [place],                            [place]; [place]: July 2009. The method performance specifications have not been established for this test in body fluid.  The test result should be integrated into the clinical context for interpretation. Performed At: [facility] [address], [place] [id number] Rush Farmer MD YY:[id number]    Source of Sample SYNOVIAL     Comment: Performed at [facility], [address] [place], [place] [address]  Synovial cell count + diff, w/ crystals     Status: Abnormal   Collection Time: 02/27/19  5:55 AM  Result Value Ref Range   Color, Synovial YELLOW YELLOW   Appearance-Synovial CLOUDY (A) CLEAR   Crystals, Fluid NO  CRYSTALS SEEN    WBC, Synovial 30,687 (H) 0 - 200 /cu mm   Neutrophil, Synovial 87 %   Lymphocytes-Synovial Fld 10 %   Monocyte-Macrophage-Synovial Fluid 3 %   Eosinophils-Synovial 0 %    Comment: Performed at [facility], [address] [place], [place] [address]  Troponin I (High Sensitivity)     Status: Abnormal   Collection Time: 02/27/19  8:52 AM  Result Value Ref Range   Troponin I (High Sensitivity) 122 (HH) <18 ng/L    Comment: CRITICAL RESULT CALLED TO, READ BACK BY AND VERIFIED WITH LANCE LANSA AT 1020 02/27/2019 DAS (NOTE) Elevated high sensitivity troponin I (hsTnI) values and significant  changes across serial measurements may suggest ACS but many other  chronic and acute conditions are known to elevate hsTnI results.  Refer to the "Links" section for chest pain algorithms and additional  guidance. Performed at [facility], [address] [place], [place] [address]   Troponin I (High Sensitivity)     Status: Abnormal   Collection Time: 02/27/19 10:55 AM  Result Value Ref Range   Troponin I (High Sensitivity) 116 (HH) <18 ng/L    Comment: CRITICAL VALUE NOTED. VALUE IS CONSISTENT WITH PREVIOUSLY REPORTED/CALLED VALUE TIK/DAS (NOTE) Elevated high sensitivity troponin I (hsTnI) values and significant  changes  across serial measurements may suggest ACS but many other  chronic and acute conditions are known to elevate hsTnI results.  Refer to the "Links" section for chest pain algorithms and additional  guidance. Performed at [facility], [address] [place], [place] [address]   Protime-INR     Status: Abnormal   Collection Time: 02/28/19  6:14 AM  Result Value Ref Range   Prothrombin Time 21.2 (H) 11.4 - 15.2 seconds   INR 1.9 (H) 0.8 - 1.2    Comment: (NOTE) INR goal varies based on device and disease states. Performed at [facility], [address] [place], [place] [address]   Cortisol-am, blood     Status:  None   Collection Time: 02/28/19  6:14 AM  Result Value Ref Range   Cortisol - AM 9.7 6.7 - 22.6 ug/dL    Comment: Performed at [facility], [place] [address], [place], [place] [address]  Procalcitonin     Status: None   Collection Time: 02/28/19  6:14 AM  Result Value Ref Range   Procalcitonin 0.32 ng/mL    Comment:        Interpretation: PCT (Procalcitonin) <= 0.5 ng/mL: Systemic infection (sepsis) is not likely. Local bacterial infection is possible. (NOTE)       Sepsis PCT Algorithm           Lower Respiratory Tract                                      Infection PCT Algorithm    ----------------------------     ----------------------------         PCT < 0.25 ng/mL                PCT < 0.10 ng/mL         Strongly encourage             Strongly discourage   discontinuation of antibiotics    initiation of antibiotics    ----------------------------     -----------------------------       PCT 0.25 - 0.50 ng/mL            PCT 0.10 - 0.25 ng/mL               OR       >80% decrease in PCT            Discourage initiation of                                            antibiotics      Encourage discontinuation           of antibiotics    ----------------------------     -----------------------------         PCT >= 0.50 ng/mL              PCT 0.26 - 0.50 ng/mL               AND        <80% decrease in PCT             Encourage initiation of  antibiotics       Encourage continuation           of antibiotics    ----------------------------     -----------------------------        PCT >= 0.50 ng/mL                  PCT > 0.50 ng/mL               AND         increase in PCT                  Strongly encourage                                      initiation of antibiotics    Strongly encourage escalation           of antibiotics                                     -----------------------------                                            PCT <= 0.25 ng/mL                                                 OR                                        > 80% decrease in PCT                                     Discontinue / Do not initiate                                             antibiotics Performed at [facility], [address] [place], [place] [address]   Basic metabolic panel     Status: Abnormal   Collection Time: 02/28/19  6:14 AM  Result Value Ref Range   Sodium 134 (L) 135 - 145 mmol/L   Potassium 4.2 3.5 - 5.1 mmol/L   Chloride 105 98 - 111 mmol/L   CO2 24 22 - 32 mmol/L   Glucose, Bld 105 (H) 70 - 99 mg/dL   BUN 18 8 - 23 mg/dL   Creatinine, Ser 0.85 0.44 - 1.00 mg/dL   Calcium 7.8 (L) 8.9 - 10.3 mg/dL   GFR calc non Af Amer >60 >60 mL/min   GFR calc Af Amer >60 >60 mL/min   Anion gap 5 5 - 15    Comment: Performed at [facility], [address], [place], [place] [address]  CBC     Status: Abnormal   Collection Time: 02/28/19  6:14 AM  Result Value Ref Range   WBC  9.3 4.0 - 10.5 K/uL   RBC 3.12 (L) 3.87 - 5.11 MIL/uL   Hemoglobin 9.7 (L) 12.0 - 15.0 g/dL   HCT 29.4 (L) 36.0 - 46.0 %   MCV 94.2 80.0 - 100.0 fL   MCH 31.1 26.0 - 34.0 pg   MCHC 33.0 30.0 - 36.0 g/dL   RDW 12.6 11.5 - 15.5 %   Platelets 189 150 - 400 K/uL   nRBC 0.0 0.0 - 0.2 %    Comment: Performed at [facility], [address] [place], [place] [address]    Dg Chest Port 1 View  Result Date: 02/27/2019 CLINICAL DATA:  Shortness of breath EXAM: PORTABLE CHEST 1 VIEW COMPARISON:  07/23/2018 FINDINGS: The heart size and mediastinal contours are within normal limits. Both lungs are clear. The visualized skeletal structures are unremarkable. IMPRESSION: No active disease. Electronically Signed   By: Kathreen Devoid   On: 02/27/2019 03:59   Dg Knee Complete 4 Views Left  Result Date: 02/27/2019 CLINICAL DATA:  Bilateral knee pain status post fall EXAM: LEFT KNEE - COMPLETE 4+ VIEW COMPARISON:  None. FINDINGS:  No acute fracture or dislocation. No aggressive osseous lesion. No joint effusion. 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 Small medial femorotibial compartment marginal osteophytes. IMPRESSION: No acute osseous injury of the left knee. Electronically Signed   By: Kathreen Devoid   On: 02/27/2019 03:59   Dg Knee Complete 4 Views Right  Result Date: 02/27/2019 CLINICAL DATA:  Bilateral knee pain status post fall EXAM: RIGHT KNEE - COMPLETE 4+ VIEW COMPARISON:  None. FINDINGS: No acute fracture or dislocation. No aggressive osseous lesion. Large joint effusion. 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 Mild osteoarthritis of the medial femorotibial compartment with mild joint space narrowing and marginal osteophytes. IMPRESSION: 1.  No acute osseous injury of the right knee. 2. Large joint effusion. Electronically Signed   By: Kathreen Devoid   On: 02/27/2019 04:01    Review of Systems  Constitutional: Positive for diaphoresis, fever and malaise/fatigue.  HENT: Positive for congestion.   Eyes: Negative.   Respiratory: Positive for shortness of breath.   Cardiovascular: Positive for palpitations and PND.  Gastrointestinal: Positive for heartburn.  Genitourinary: Negative.   Musculoskeletal: Positive for back pain, joint pain and myalgias.  Skin: Negative.   Neurological: Positive for dizziness and weakness.  Endo/Heme/Allergies: Negative.   Psychiatric/Behavioral: Negative.    Blood pressure 122/85, pulse 76, temperature 98.5 F (36.9 C), temperature source Oral, resp. rate 19, height 5\' 4"  (1.626 m), weight 63.5 kg, SpO2 93 %. Physical Exam  Nursing note and vitals reviewed. Constitutional: She is oriented to person, place, and time. She appears well-developed and well-nourished.  HENT:  Head: Normocephalic and atraumatic.  Eyes: Pupils are equal, round, and reactive to light.  Conjunctivae and EOM are normal.  Neck: Normal range of motion. Neck supple.  Cardiovascular: S1 normal, normal heart sounds and normal pulses. An irregularly irregular rhythm present. Tachycardia present. PMI is displaced.  Respiratory: Effort normal and breath sounds normal.  GI: Soft. Bowel sounds are normal.  Musculoskeletal: Normal range of motion.        General: Tenderness, deformity and edema present.  Neurological: She is alert and oriented to person, place, and time. She has normal reflexes.  Skin: Skin is warm and dry.  Psychiatric: She has a normal mood and affect.    Assessment/Plan: Atrial fibrillation  Near syncope Weakness Knee pain Osteoarthritis Rheumatoid arthritis  valvular heart disease Obesity Hyperlipidemia . Plan Agree with admission to telemetry Follow-up EKGs troponins Continue IV Cardizem for rate control Lipid management for lipid control Eliquis twice a day for anticoagulation for A. Fib Hypertension management and control Agree with aspiration of the knee joint Continue broad-spectrum antibiotic therapy Hold off on antiarrhythmic for now Increase Cardizem dose to control rate to 240 and consider discontinuing drip Have patient follow-up with Dr. Ubaldo Glassing as an outpatient  Ayannah Faddis D Charice Zuno 02/28/2019, 1:21 PM

## 2019-02-28 NOTE — Progress Notes (Signed)
Sent dr. Ubaldo Glassing a paged to make aware patient has converted to nsr at [phone]. Currently on cardizem drip

## 2019-02-28 NOTE — Progress Notes (Signed)
Per dr. Clayborn Bigness start on cardizem 240mg  po daily and discontinue iv

## 2019-02-28 NOTE — Evaluation (Signed)
Physical Therapy Evaluation Patient Details Name: BAILLIE NASON MRN: [id number] DOB: Jul 29, 1940 Today's Date: 02/28/2019   History of Present Illness  78 yo female who presented to ED with complaints of acute onset of palpitations, generalized weakness and dizziness, PMH includes anemia, HTN, OA, OP, RA, cancer  Clinical Impression  Pt is a 78 yo female admitted for above. Pt in bed upon arrival with daughter present and agreed to participate with PT. Pt performed bed level therex with cuing for technique. Pt required min guard assist for bed mobility and reported mild dizziness upon sitting EOB. Symptoms monitored and pt reported resolved symptoms quickly. Pt able to maintain sitting balance with no UE support and feet supported while leaning in all directions with trunk with no LOB and requiring no physical assistance to return to midline. Pt ambulated one lap with RW and one lap without RW with increased antalgic gait pattern and unsteadiness without RW. Pt reports increased left knee pain without RW. Pt educated on continuing to use RW while knee is bothering her for safety. Vitals monitored throughout session with HR staying in the 80s and 90s and SpO2 staying 93%+ during all mobility tasks. Pt presents with decreased strength, endurance, and balance and would benefit from skilled therapy to improve noted deficits. Pt would benefit from home health PT to further improve deficits and safety at home since she does live alone and for return to PLOF. Pt and daughter educated on recommendation and agreed.     Follow Up Recommendations Home health PT    Equipment Recommendations  Rolling walker with 5" wheels    Recommendations for Other Services       Precautions / Restrictions Precautions Precautions: Fall Restrictions Weight Bearing Restrictions: No      Mobility  Bed Mobility Overal bed mobility: Needs Assistance Bed Mobility: Supine to Sit     Supine to sit: Supervision      General bed mobility comments: increased time and effort, no physical assist needed, supervision for safety, pt reported mild dizziness upon initial sitting EOB but symptoms improved quickly  Transfers Overall transfer level: Needs assistance Equipment used: Rolling walker (2 wheeled) Transfers: Sit to/from Stand Sit to Stand: Min guard         General transfer comment: min guard and RW for safety, VC for hand placement  Ambulation/Gait Ambulation/Gait assistance: Min guard Gait Distance (Feet): 300 Feet Assistive device: Rolling walker (2 wheeled);None(one lap with RW one lap without) Gait Pattern/deviations: Antalgic;Decreased stride length;Trunk flexed Gait velocity: decreased   General Gait Details: pt ambulated one lap with RW and one lap with no AD, antalgic gait pattern secondary to pain in left knee, vitals assessed throughout with HR staying in the 90s and SpO2 92%+, pt had no unsteadiness or LOB but did exhibit increased limping without RW  Stairs            Wheelchair Mobility    Modified Rankin (Stroke Patients Only)       Balance Overall balance assessment: Mild deficits observed, not formally tested(pt able to maintain sitting and standing balance without support, pt able to lean anterior/posterior/left/right with no UE support and feet supported sitting EOB without LOB)                                           Pertinent Vitals/Pain Pain Assessment: Faces Faces Pain Scale: Hurts a little  bit Pain Location: left knee Pain Descriptors / Indicators: Sore;Tender Pain Intervention(s): Limited activity within patient's tolerance;Monitored during session    Home Living Family/patient expects to be discharged to:: Private residence Living Arrangements: Children Available Help at Discharge: Family;Available PRN/intermittently Type of Home: House Home Access: Stairs to enter Entrance Stairs-Rails: Can reach both Entrance Stairs-Number of  Steps: 4 Home Layout: Laundry or work area in basement;Able to live on main level with bedroom/bathroom 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, contracting      Prior Function Level of Independence: Independent         Comments: independent with ADLs and household ambulation, no falls in the last year     Hand Dominance        Extremity/Trunk Assessment   Upper Extremity Assessment Upper Extremity Assessment: Overall WFL for tasks assessed    Lower Extremity Assessment Lower Extremity Assessment: Overall WFL for tasks assessed(B LE strength grossly 3+/5)    Cervical / Trunk Assessment Cervical / Trunk Assessment: Kyphotic  Communication   Communication: No difficulties  Cognition Arousal/Alertness: Awake/alert Behavior During Therapy: WFL for tasks assessed/performed Overall Cognitive Status: Within Functional Limits for tasks assessed                                        General Comments      Exercises Total Joint Exercises Ankle Circles/Pumps: AROM;Both;20 reps Gluteal Sets: AROM;Both;10 reps Straight Leg Raises: AROM;Both;10 reps Marching in Standing: AROM;Both;10 reps   Assessment/Plan    PT Assessment Patient needs continued PT services  PT Problem List Decreased strength;Decreased mobility;Decreased safety awareness;Decreased activity tolerance;Decreased knowledge of precautions;Decreased range of motion;Decreased balance;Decreased knowledge of use of DME;Pain       PT Treatment Interventions DME instruction;Therapeutic exercise;Gait training;Balance training;Stair training;Neuromuscular re-education;Functional mobility training;Therapeutic activities;Patient/family education    PT Goals (Current goals can be found in the Care Plan section)  Acute Rehab PT Goals Patient Stated Goal: return home PT Goal Formulation: With patient Time For Goal Achievement: 03/07/19 Potential to Achieve Goals: Good    Frequency Min 2X/week   Barriers to discharge  Decreased caregiver support      Co-evaluation               AM-PAC PT "6 Clicks" Mobility  Outcome Measure Help needed turning from your back to your side while in a flat bed without using bedrails?: None Help needed moving from lying on your back to sitting on the side of a flat bed without using bedrails?: None Help needed moving to and from a bed to a chair (including a wheelchair)?: A Little Help needed standing up from a chair using your arms (e.g., wheelchair or bedside chair)?: A Little Help needed to walk in hospital room?: A Little Help needed climbing 3-5 steps with a railing? : A Little 6 Click Score: 20    End of Session Equipment Utilized During Treatment: Gait belt Activity Tolerance: Patient tolerated treatment well Patient left: in chair;with call bell/phone within reach;with chair alarm set;with family/visitor present Nurse Communication: Mobility status PT Visit Diagnosis: Unsteadiness on feet (R26.81);Other abnormalities of gait and mobility (R26.89);Muscle weakness (generalized) (M62.81)    Time: [id number] PT Time Calculation (min) (ACUTE ONLY): 36 min   Charges:   PT Evaluation $PT Eval Low Complexity: 1 Low PT Treatments $Therapeutic Exercise: 8-22 mins        Del Overfelt PT, DPT 3:36 PM,02/28/19 [phone]

## 2019-02-28 NOTE — Progress Notes (Signed)
[facility] NAME: Kaitlyn Huff    MR#:  [id number]  DATE OF BIRTH:  1940-12-31  SUBJECTIVE: Patient is seen at bedside, patient says that her left knee pain is better and she is able to move the left leg, denies any chest pain or palpitations and today she feels much better and wants to go home by tomorrow and also want to walk today.  CHIEF COMPLAINT:   Chief Complaint  Patient presents with  . Near Syncope    REVIEW OF SYSTEMS:   ROS CONSTITUTIONAL: No fever, fatigue or weakness.  EYES: No blurred or double vision.  EARS, NOSE, AND THROAT: No tinnitus or ear pain.  RESPIRATORY: No cough, shortness of breath, wheezing or hemoptysis.  CARDIOVASCULAR: No chest pain, orthopnea, edema.  GASTROINTESTINAL: No nausea, vomiting, diarrhea or abdominal pain.  GENITOURINARY: No dysuria, hematuria.  ENDOCRINE: No polyuria, nocturia,  HEMATOLOGY: No anemia, easy bruising or bleeding SKIN: No rash or lesion. MUSCULOSKELETAL: No joint pain or arthritis.   NEUROLOGIC: No tingling, numbness, weakness.  PSYCHIATRY: No anxiety or depression.   DRUG ALLERGIES:   Allergies  Allergen Reactions  . Tramadol Itching  . Astelin [Azelastine Hcl] Other (See Comments)    Reaction:  Unknown   . Codeine Other (See Comments)    Reaction:  Altered mental status  . Dilaudid [Hydromorphone] Other (See Comments)    Reaction:  Unknown   . Flexeril [Cyclobenzaprine] Other (See Comments)    Reaction:  Unknown   . Imuran [Azathioprine] Other (See Comments)    Reaction:  Abnormal liver function  . Lisinopril Itching  . Lyrica [Pregabalin] Other (See Comments)    Reaction:  Sore gums   . Methotrexate Derivatives Other (See Comments)    Reaction:  Abnormal liver function  . Amiodarone Anxiety and Other (See Comments)    Pt could not eat or sleep. Caused n/v and "messed her up"  . Amoxicillin Rash and Other (See Comments)    Unable to obtain  enough information to answer additional questions about this medication.    Jolee Ewing [Leflunomide] Rash  . Clindamycin/Lincomycin Rash  . Doxycycline Rash  . Lodine [Etodolac] Rash  . Percocet [Oxycodone-Acetaminophen] Rash  . Sulfa Antibiotics Rash    VITALS:  Blood pressure 122/85, pulse 76, temperature 98.5 F (36.9 C), temperature source Oral, resp. rate 19, height 5\' 4"  (1.626 m), weight 63.5 kg, SpO2 93 %.  PHYSICAL EXAMINATION:  GENERAL:  78 y.o.-year-old patient lying in the bed with no acute distress.  EYES: Pupils equal, round, reactive to light and accommodation. No scleral icterus. Extraocular muscles intact.  HEENT: Head atraumatic, normocephalic. Oropharynx and nasopharynx clear.  NECK:  Supple, no jugular venous distention. No thyroid enlargement, no tenderness.  LUNGS: Normal breath sounds bilaterally, no wheezing, rales,rhonchi or crepitation. No use of accessory muscles of respiration.  CARDIOVASCULAR: S1, S2 regular. No murmurs, rubs, or gallops.  ABDOMEN: Soft, nontender, nondistended. Bowel sounds present. No organomegaly or mass.  EXTREMITIES: No pedal edema, cyanosis, or clubbing.  NEUROLOGIC: Cranial nerves II through XII are intact. Muscle strength 5/5 in all extremities. Sensation intact. Gait not checked.  PSYCHIATRIC: The patient is alert and oriented x 3.  SKIN: No obvious rash, lesion, or ulcer.    LABORATORY PANEL:   CBC Recent Labs  Lab 02/28/19 0614  WBC 9.3  HGB 9.7*  HCT 29.4*  PLT 189   ------------------------------------------------------------------------------------------------------------------  Chemistries  Recent Labs  Lab 02/27/19 0355  02/28/19 0614  NA 132* 134*  K 3.2* 4.2  CL 97* 105  CO2 28 24  GLUCOSE 115* 105*  BUN 14 18  CREATININE 0.89 0.85  CALCIUM 8.7* 7.8*  AST 42*  --   ALT 33  --   ALKPHOS 45  --   BILITOT 0.9  --     ------------------------------------------------------------------------------------------------------------------  Cardiac Enzymes No results for input(s): TROPONINI in the last 168 hours. ------------------------------------------------------------------------------------------------------------------  RADIOLOGY:  Dg Chest Port 1 View  Result Date: 02/27/2019 CLINICAL DATA:  Shortness of breath EXAM: PORTABLE CHEST 1 VIEW COMPARISON:  07/23/2018 FINDINGS: The heart size and mediastinal contours are within normal limits. Both lungs are clear. The visualized skeletal structures are unremarkable. IMPRESSION: No active disease. Electronically Signed   By: Kathreen Devoid   On: 02/27/2019 03:59   Dg Knee Complete 4 Views Left  Result Date: 02/27/2019 CLINICAL DATA:  Bilateral knee pain status post fall EXAM: LEFT KNEE - COMPLETE 4+ VIEW COMPARISON:  None. FINDINGS: No acute fracture or dislocation. No aggressive osseous lesion. No joint effusion. 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 Small medial femorotibial compartment marginal osteophytes. IMPRESSION: No acute osseous injury of the left knee. Electronically Signed   By: Kathreen Devoid   On: 02/27/2019 03:59   Dg Knee Complete 4 Views Right  Result Date: 02/27/2019 CLINICAL DATA:  Bilateral knee pain status post fall EXAM: RIGHT KNEE - COMPLETE 4+ VIEW COMPARISON:  None. FINDINGS: No acute fracture or dislocation. No aggressive osseous lesion. Large joint effusion. 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 Mild osteoarthritis of the medial femorotibial compartment with mild joint space narrowing and marginal osteophytes. IMPRESSION: 1.  No acute osseous injury of the right knee. 2. Large joint effusion. Electronically Signed   By: Kathreen Devoid   On: 02/27/2019 04:01    EKG:   Orders placed or performed during the hospital encounter  of 02/27/19  . EKG 12-Lead  . EKG 12-Lead  . ED EKG 12-Lead  . ED EKG 12-Lead    ASSESSMENT AND PLAN:  78 year old female patient with A. fib with RVR patient is converted to sinus rhythm,, now, patient required Cardizem drip, transition to oral Cardizem today as per Dr,Callwood.  Continue Eliquis. 2.  Right knee swelling, pain due to trauma patient hit side of the table, today she feels well after arthrocentesis done in the emergency room, able to move the right knee, appears very comfortable and hopeful she can go tomorrow.  Right knee synovial fluid is negative for any infection so far.  Procalcitonin slightly elevated.  Low-grade temperature in the emergency room but afebrile now.  Patient is on vancomycin, .,  Rocephin.  Check procalcitonin again today.  Plan to continue both today 3.  Difficulty walking secondary to knee pain, now better, physical therapy consult requested. 4.  History of rheumatoid arthritis, patient is on chronic prednisone,    All the records are reviewed and case discussed with Care Management/Social Workerr. Management plans discussed with the patient, family and they are in agreement.  CODE STATUS: Full code  TOTAL TIME TAKING CARE OF THIS PATIENT:45 minutes.   POSSIBLE D/C IN 1-2 DAYS, DEPENDING ON CLINICAL CONDITION.   Epifanio Lesches M.D on 02/28/2019 at 12:06 PM  Between 7am to 6pm - Pager - [phone]  After 6pm go to [email or web address] - [facility]  Office  [phone]  CC: Primary care physician; [name], MD   Note: This dictation was prepared with Dragon dictation along with smaller phrase technology. Any transcriptional errors that result from this process are unintentional.

## 2019-03-01 LAB — PROCALCITONIN: Procalcitonin: 0.18 ng/mL

## 2019-03-01 MED ORDER — DILTIAZEM HCL ER COATED BEADS 240 MG PO CP24: 240.0000 mg | ORAL_CAPSULE | Freq: Every day | ORAL | 0 refills | Status: DC

## 2019-03-01 MED ORDER — LEVOFLOXACIN 500 MG PO TABS: 500.0000 mg | ORAL_TABLET | Freq: Every day | ORAL | 0 refills | Status: DC

## 2019-03-01 MED ORDER — DOCUSATE SODIUM 100 MG PO CAPS
100.0000 mg | ORAL_CAPSULE | Freq: Two times a day (BID) | ORAL | Status: DC
  Administered 2019-03-01 – 2019-03-02 (×4): 100 mg via ORAL
  Filled 2019-03-01 (×5): qty 1

## 2019-03-01 MED ORDER — SODIUM CHLORIDE 0.9% FLUSH
10.0000 mL | Freq: Two times a day (BID) | INTRAVENOUS | Status: DC
  Administered 2019-03-01 – 2019-03-02 (×3): 10 mL via INTRAVENOUS
  Administered 2019-03-03: 3 mL via INTRAVENOUS

## 2019-03-01 MED ORDER — LEVOFLOXACIN 500 MG PO TABS
500.0000 mg | ORAL_TABLET | Freq: Every day | ORAL | Status: DC
  Administered 2019-03-01 – 2019-03-03 (×3): 500 mg via ORAL
  Filled 2019-03-01 (×3): qty 1

## 2019-03-01 NOTE — Progress Notes (Signed)
Patient heart rate jumping to 120-130s.  Patient states no pain but feels her heart racing.  Cardizem drip increased to 6ml/hr

## 2019-03-01 NOTE — Progress Notes (Signed)
PT Cancellation Note  Patient Details Name: Kaitlyn Huff MRN: [id number] DOB: January 10, 1941   Cancelled Treatment:    Reason Eval/Treat Not Completed: Medical issues which prohibited therapy.  Spoke to nursing who requested PT hold this date secondary to pt's Cardizem drip recently increased and pt very fatigued at this time.  Will attempt to see pt at a future date/time as medically appropriate.    Linus Salmons PT, DPT 03/01/19, 3:06 PM

## 2019-03-01 NOTE — Progress Notes (Addendum)
Patient Name: Kaitlyn Huff Date of Encounter: 03/01/2019  Hospital Problem List     Active Problems:   Atrial fibrillation with rapid ventricular response [facility])    Patient Profile     Patient with a history of atrial fibrillation admitted with atrial fibrillation with rapid ventricular response.  This was exacerbated by a wound on her left knee suffered with a mechanical fall.  Rate is improved with IV Cardizem although she required increasing dosing last night due to rapid A. fib.  Subjective   Some knee pain but feels less short of breath  Inpatient Medications    . acidophilus  1 capsule Oral Daily  . apixaban  5 mg Oral BID  . aspirin EC  81 mg Oral Daily  . calcium-vitamin D  1 tablet Oral BID  . cholecalciferol  1,000 Units Oral Daily  . colestipol  2 g Oral Daily  . diltiazem  240 mg Oral Daily  . fluticasone  2 spray Each Nare Daily  . furosemide  20 mg Oral Daily  . loratadine  10 mg Oral Daily  . multivitamin with minerals  1 tablet Oral Daily  . pantoprazole  40 mg Oral Daily  . potassium chloride  10 mEq Oral Daily  . predniSONE  5 mg Oral Q breakfast  . simvastatin  20 mg Oral Daily    Vital Signs    Vitals:   03/01/19 0231 03/01/19 0301 03/01/19 0331 03/01/19 0806  BP: 131/79 120/67 (!) 105/58 122/65  Pulse: 80 78 73 76  Resp:    19  Temp:    98.7 F (37.1 C)  TempSrc:    Oral  SpO2: 91% 91% 93% 95%  Weight:      Height:        Intake/Output Summary (Last 24 hours) at 03/01/2019 1018 Last data filed at 03/01/2019 0300 Gross per 24 hour  Intake 816.84 ml  Output 1000 ml  Net -183.16 ml   Filed Weights   02/27/19 0414 02/27/19 0841 02/28/19 0402  Weight: 59 kg 62.6 kg 63.5 kg    Physical Exam    GEN: Well nourished, well developed, in no acute distress.  HEENT: normal.  Neck: Supple, no JVD, carotid bruits, or masses. Cardiac: RRR, no murmurs, rubs, or gallops. No clubbing, cyanosis, edema.  Radials/DP/PT 2+ and equal bilaterally.   Respiratory:  Respirations regular and unlabored, clear to auscultation bilaterally. GI: Soft, nontender, nondistended, BS + x 4. MS: no deformity or atrophy. Skin: warm and dry, no rash. Neuro:  Strength and sensation are intact. Psych: Normal affect.  Labs    CBC Recent Labs    02/27/19 0355 02/28/19 0614  WBC 11.3* 9.3  NEUTROABS 8.2*  --   HGB 11.2* 9.7*  HCT 32.9* 29.4*  MCV 90.9 94.2  PLT 230 [id number]   Basic Metabolic Panel Recent Labs    02/27/19 0355 02/28/19 0614  NA 132* 134*  K 3.2* 4.2  CL 97* 105  CO2 28 24  GLUCOSE 115* 105*  BUN 14 18  CREATININE 0.89 0.85  CALCIUM 8.7* 7.8*   Liver Function Tests Recent Labs    02/27/19 0355  AST 42*  ALT 33  ALKPHOS 45  BILITOT 0.9  PROT 7.4  ALBUMIN 3.9   No results for input(s): LIPASE, AMYLASE in the last 72 hours. Cardiac Enzymes No results for input(s): CKTOTAL, CKMB, CKMBINDEX, TROPONINI in the last 72 hours. BNP No results for input(s): BNP in the last 72 hours.  D-Dimer No results for input(s): DDIMER in the last 72 hours. Hemoglobin A1C No results for input(s): HGBA1C in the last 72 hours. Fasting Lipid Panel No results for input(s): CHOL, HDL, LDLCALC, TRIG, CHOLHDL, LDLDIRECT in the last 72 hours. Thyroid Function Tests No results for input(s): TSH, T4TOTAL, T3FREE, THYROIDAB in the last 72 hours.  Invalid input(s): FREET3  Telemetry  Atrial fibrillation with variable but fairly rapid ventricular response  ECG    Initial A. fib with rapid ventricular response.    Radiology    Dg Chest Port 1 View  Result Date: 02/27/2019 CLINICAL DATA:  Shortness of breath EXAM: PORTABLE CHEST 1 VIEW COMPARISON:  07/23/2018 FINDINGS: The heart size and mediastinal contours are within normal limits. Both lungs are clear. The visualized skeletal structures are unremarkable. IMPRESSION: No active disease. Electronically Signed   By: Kathreen Devoid   On: 02/27/2019 03:59   Dg Knee Complete 4 Views  Left  Result Date: 02/27/2019 CLINICAL DATA:  Bilateral knee pain status post fall EXAM: LEFT KNEE - COMPLETE 4+ VIEW COMPARISON:  None. FINDINGS: No acute fracture or dislocation. No aggressive osseous lesion. No joint effusion. 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 Small medial femorotibial compartment marginal osteophytes. IMPRESSION: No acute osseous injury of the left knee. Electronically Signed   By: Kathreen Devoid   On: 02/27/2019 03:59   Dg Knee Complete 4 Views Right  Result Date: 02/27/2019 CLINICAL DATA:  Bilateral knee pain status post fall EXAM: RIGHT KNEE - COMPLETE 4+ VIEW COMPARISON:  None. FINDINGS: No acute fracture or dislocation. No aggressive osseous lesion. Large joint effusion. 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 Mild osteoarthritis of the medial femorotibial compartment with mild joint space narrowing and marginal osteophytes. IMPRESSION: 1.  No acute osseous injury of the right knee. 2. Large joint effusion. Electronically Signed   By: Kathreen Devoid   On: 02/27/2019 04:01    Assessment & Plan    Patient with history of atrial fibrillation which is paroxysmal.  Suffered some trauma to her left knee with a mechanical fall causing infectious process.  Developed A. fib with RVR.  Was on Cardizem drip now on p.o. Cardizem at 240 mg daily.  Rate still somewhat variable and occasionally rapid.  Will attempt to wean off of IV Cardizem and remain on p.o.  Continue with anticoagulation.  Signed, Javier Docker Alaila Pillard MD 03/01/2019, 10:18 AM  Pager: [phone]

## 2019-03-01 NOTE — TOC Initial Note (Signed)
Transition of Care [facility]) - Initial/Assessment Note    Patient Details  Name: Kaitlyn Huff MRN: [id number] Date of Birth: March 15, 1941  Transition of Care [facility]) CM/SW Contact:    Ross Ludwig, LCSW Phone Number: 03/01/2019, 4:56 PM  Clinical Narrative:                  Patient is a 78 year old female who is alert and oriented x4.  Patient states she lives with her daughter who transports her to appointments, picks up her medications and helps take care of her.  Patient stated that her son lives close by and visits regularly.  CSW explained role and process for setting up home health care.  Patient stated that she has had home health in the past and she thought it was with [facility].  Patient stated she would like home health again with them if possible.  CSW contacted [place] at [facility] and he said they can accept patient.  CSW asked if there was any other equipment that she may need and she said no not right now.  CSW to continue to follow patient's progress throughout discharge planning.   Expected Discharge Plan: [place] Barriers to Discharge: Continued Medical Work up   Patient Goals and CMS Choice   CMS Medicare.gov Compare Post Acute Care list provided to:: Patient Choice offered to / list presented to : Patient  Expected Discharge Plan and Services Expected Discharge Plan: [place] In-house Referral: Clinical Social Work   Post Acute Care Choice: Home Health   Expected Discharge Date: 03/01/19               DME Arranged: N/A DME Agency: NA       HH Arranged: PT HH Agency: [place] ([facility]) Date HH Agency Contacted: 03/01/19 Time HH Agency Contacted: 1500 Representative spoke with at [place]: [place] Arrangements/Services   Lives with:: Adult Children Patient language and need for interpreter reviewed:: Yes Do you feel safe going back to the place where you live?: Yes       Need for Family Participation in Patient Care: No (Comment) Care giver support system in place?: Yes (comment)   Criminal Activity/Legal Involvement Pertinent to Current Situation/Hospitalization: No - Comment as needed  Activities of Daily Living Home Assistive Devices/Equipment: None ADL Screening (condition at time of admission) Patient's cognitive ability adequate to safely complete daily activities?: Yes Is the patient deaf or have difficulty hearing?: No Does the patient have difficulty seeing, even when wearing glasses/contacts?: No Does the patient have difficulty concentrating, remembering, or making decisions?: No Patient able to express need for assistance with ADLs?: Yes Does the patient have difficulty dressing or bathing?: No Independently performs ADLs?: Yes (appropriate for developmental age) Does the patient have difficulty walking or climbing stairs?: Yes Weakness of Legs: Left Weakness of Arms/Hands: None  Permission Sought/Granted Permission sought to share information with : Case Manager, Family Supports Permission granted to share information with : Yes, Verbal Permission Granted  Share Information with NAME: Allen,Amy Daughter [phone]   or [place] granted to share info w AGENCY: [place]        Emotional Assessment Appearance:: Appears stated age Attitude/Demeanor/Rapport: Engaged   Orientation: : Oriented to Self, Oriented to Place, Oriented to  Time, Oriented to Situation Alcohol / Substance Use: Not Applicable Psych Involvement: No (comment)  Admission diagnosis:  Atrial fibrillation with rapid  ventricular response (HCC) [I48.91] Effusion of left knee [M25.462] Sepsis without acute organ dysfunction, due to unspecified organism [facility]) [A41.9] Patient Active Problem List   Diagnosis Date Noted  . Atrial fibrillation with rapid ventricular response ([place]) 02/27/2019  . DJD  (degenerative joint disease), lumbosacral 04/02/2018  . Nodule of groin 04/01/2018  . Fatty liver 03/29/2018  . Anxiety 01/19/2018  . Difficulty sleeping 01/19/2018  . Acute urticaria 12/21/2017  . Colitis 06/24/2017  . Dizziness 10/01/2016  . A-fib ([place]) 07/12/2016  . 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 Chronic venous insufficiency 04/25/2016  . Pain in limb 04/25/2016  . Swelling of limb 04/25/2016  . Tachycardia 03/22/2016  . Foot fracture 03/18/2016  . Cough 01/27/2016  . Hypoxia 12/20/2015  . Lower extremity edema 12/18/2015  . Diarrhea 09/15/2015  . Restless leg syndrome 08/07/2015  . History of colonic polyps 07/10/2015  . Health care maintenance 01/11/2015  . Weight loss 08/04/2013  . Obstructive sleep apnea 12/23/2012  . Dilated bile duct 12/23/2012  . Hypercholesterolemia 08/12/2012  . Rheumatoid arthritis ([place]) 08/12/2012  . GERD (gastroesophageal reflux disease) 08/12/2012  . Hypertension 08/12/2012  . Anemia 08/12/2012   PCP:  [name], MD Pharmacy:   [facility] [address] [place] [facility] [place] [address] Phone: [phone] Fax: [phone]  [facility] [place] [address] [place] [address] Phone: [phone] Fax: [phone]     Social Determinants of Health (SDOH) Interventions    Readmission Risk Interventions No flowsheet data found.

## 2019-03-01 NOTE — Progress Notes (Signed)
[place] [facility] [employer] NAME: Kaitlyn Huff    MR#:  [id number]  DATE OF BIRTH:  [birth year]  SUBJECTIVE: Darted back on Cardizem drip this morning .  Patient complains of back pain, feels tired.  Will start oral Cardizem, heparin,taper off  Cardizem drip.  CHIEF COMPLAINT:   Chief Complaint  Patient presents with  . Near Syncope    REVIEW OF SYSTEMS:   ROS CONSTITUTIONAL: No fever, fatigue or weakness.  EYES: No blurred or double vision.  EARS, NOSE, AND THROAT: No tinnitus or ear pain.  RESPIRATORY: No cough, shortness of breath, wheezing or hemoptysis.  CARDIOVASCULAR: No chest pain, orthopnea, edema.  GASTROINTESTINAL: No nausea, vomiting, diarrhea or abdominal pain.  GENITOURINARY: No dysuria, hematuria.  ENDOCRINE: No polyuria, nocturia,  HEMATOLOGY: No anemia, easy bruising or bleeding SKIN: No rash or lesion. MUSCULOSKELETAL: No joint pain or arthritis.   NEUROLOGIC: No tingling, numbness, weakness.  PSYCHIATRY: No anxiety or depression.   DRUG ALLERGIES:   Allergies  Allergen Reactions  . Tramadol Itching  . Astelin [Azelastine Hcl] Other (See Comments)    Reaction:  Unknown   . Codeine Other (See Comments)    Reaction:  Altered mental status  . Dilaudid [Hydromorphone] Other (See Comments)    Reaction:  Unknown   . Flexeril [Cyclobenzaprine] Other (See Comments)    Reaction:  Unknown   . Imuran [Azathioprine] Other (See Comments)    Reaction:  Abnormal liver function  . Lisinopril Itching  . Lyrica [Pregabalin] Other (See Comments)    Reaction:  Sore gums   . Methotrexate Derivatives Other (See Comments)    Reaction:  Abnormal liver function  . Amiodarone Anxiety and Other (See Comments)    Pt could not eat or sleep. Caused n/v and "messed her up"  . Amoxicillin Rash and Other (See Comments)    Unable to obtain enough information to answer additional questions about this medication.    Jolee Ewing [Leflunomide]  Rash  . Clindamycin/Lincomycin Rash  . Doxycycline Rash  . Lodine [Etodolac] Rash  . Percocet [Oxycodone-Acetaminophen] Rash  . Sulfa Antibiotics Rash    VITALS:  Blood pressure 122/65, pulse 76, temperature 98.7 F (37.1 C), temperature source Oral, resp. rate 19, height 5\' 4"  (1.626 m), weight 63.5 kg, SpO2 95 %.  PHYSICAL EXAMINATION:  GENERAL:  78 y.o.-year-old patient lying in the bed with no acute distress.  EYES: Pupils equal, round, reactive to light and accommodation. No scleral icterus. Extraocular muscles intact.  HEENT: Head atraumatic, normocephalic. Oropharynx and nasopharynx clear.  NECK:  Supple, no jugular venous distention. No thyroid enlargement, no tenderness.  LUNGS: Normal breath sounds bilaterally, no wheezing, rales,rhonchi or crepitation. No use of accessory muscles of respiration.  CARDIOVASCULAR: S1, S2 regular. No murmurs, rubs, or gallops.  ABDOMEN: Soft, nontender, nondistended. Bowel sounds present. No organomegaly or mass.  EXTREMITIES: No pedal edema, cyanosis, or clubbing.  NEUROLOGIC: Cranial nerves II through XII are intact. Muscle strength 5/5 in all extremities. Sensation intact. Gait not checked.  PSYCHIATRIC: The patient is alert and oriented x 3.  SKIN: No obvious rash, lesion, or ulcer.    LABORATORY PANEL:   CBC Recent Labs  Lab 02/28/19 0614  WBC 9.3  HGB 9.7*  HCT 29.4*  PLT 189   ------------------------------------------------------------------------------------------------------------------  Chemistries  Recent Labs  Lab 02/27/19 0355 02/28/19 0614  NA 132* 134*  K 3.2* 4.2  CL 97* 105  CO2 28 24  GLUCOSE 115* 105*  BUN 14 18  CREATININE 0.89 0.85  CALCIUM 8.7* 7.8*  AST 42*  --   ALT 33  --   ALKPHOS 45  --   BILITOT 0.9  --    ------------------------------------------------------------------------------------------------------------------  Cardiac Enzymes No results for input(s): TROPONINI in the last 168  hours. ------------------------------------------------------------------------------------------------------------------  RADIOLOGY:  No results found.  EKG:   Orders placed or performed during the hospital encounter of 02/27/19  . EKG 12-Lead  . EKG 12-Lead  . ED EKG 12-Lead  . ED EKG 12-Lead  . EKG 12-Lead  . EKG 12-Lead    ASSESSMENT AND PLAN:  78 year old female patient with A. fib with RVR patient is converted to sinus rhythm,, now, patient required Cardizem drip, t started back again on Cardizem drip last night secondary to A. fib with RVR,, try to wean off Cardizem drip as tolerated.  2.  Trauma to right knee and developed infection there, so far the cultures are negative from arthrocentesis done in the emergency room, will add antibiotics, start Levaquin.  proCalcitonin decreased from 0.32-0.18.  , 3.  Difficulty walking secondary to knee pain, now better, physical therapy consult requested home health physical therapy.  4.  History of rheumatoid arthritis, patient is on chronic prednisone,    All the records are reviewed and case discussed with Care Management/Social Workerr. Management plans discussed with the patient, family and they are in agreement.  CODE STATUS: Full code  TOTAL TIME TAKING CARE OF THIS PATIENT:45 minutes.   POSSIBLE D/C IN 1-2 DAYS, DEPENDING ON CLINICAL CONDITION.   Epifanio Lesches M.D on 03/01/2019 at 1:32 PM  Between 7am to 6pm - Pager - [phone]  After 6pm go to [email or web address] - password [email or web address] [place] [facility]  Office  [phone]  CC: Primary care physician; [name], MD   Note: This dictation was prepared with Dragon dictation along with smaller phrase technology. Any transcriptional errors that result from this process are unintentional.

## 2019-03-01 NOTE — Care Management Important Message (Signed)
Important Message  Patient Details  Name: Kaitlyn Huff MRN: [id number] Date of Birth: Mar 18, 1941   Medicare Important Message Given:  Yes     Dannette Barbara 03/01/2019, 11:01 AM

## 2019-03-01 NOTE — Progress Notes (Signed)
This RN decreased patient cardizem drip to 46ml/hr due to patient heart rate in 80's.  Patient heart rate at rest while sleeping in bed now in 110's.  Cardizem IV rate increased to 7.5 ml/hr.  Dr. Vianne Bulls made aware of change.  Patient without complaints of chest pain or SOB.

## 2019-03-02 LAB — BODY FLUID CULTURE: Culture: NO GROWTH

## 2019-03-02 LAB — PROCALCITONIN: Procalcitonin: <0.1 ng/mL

## 2019-03-02 MED ORDER — FLEET ENEMA 7-19 GM/118ML RE ENEM: 1.0000 | ENEMA | Freq: Every day | RECTAL | Status: DC | PRN

## 2019-03-02 MED ORDER — DOCUSATE SODIUM 100 MG PO CAPS
100.0000 mg | ORAL_CAPSULE | Freq: Two times a day (BID) | ORAL | Status: DC
  Administered 2019-03-02 – 2019-03-03 (×2): 100 mg via ORAL
  Filled 2019-03-02: qty 1

## 2019-03-02 MED ORDER — POLYETHYLENE GLYCOL 3350 17 G PO PACK
17.0000 g | PACK | Freq: Every day | ORAL | Status: DC
  Administered 2019-03-03: 17 g via ORAL
  Filled 2019-03-02: qty 1

## 2019-03-02 NOTE — Progress Notes (Signed)
Patient Name: Kaitlyn Huff Date of Encounter: 03/02/2019  Hospital Problem List     Active Problems:   Atrial fibrillation with rapid ventricular response [facility])    Patient Profile     Less sob. Anxious to go home.Rate in better control  Subjective   Feels better but somewhat anxious  Inpatient Medications    . acidophilus  1 capsule Oral Daily  . apixaban  5 mg Oral BID  . aspirin EC  81 mg Oral Daily  . calcium-vitamin D  1 tablet Oral BID  . cholecalciferol  1,000 Units Oral Daily  . colestipol  2 g Oral Daily  . diltiazem  240 mg Oral Daily  . docusate sodium  100 mg Oral BID  . fluticasone  2 spray Each Nare Daily  . furosemide  20 mg Oral Daily  . levofloxacin  500 mg Oral Daily  . loratadine  10 mg Oral Daily  . multivitamin with minerals  1 tablet Oral Daily  . pantoprazole  40 mg Oral Daily  . potassium chloride  10 mEq Oral Daily  . predniSONE  5 mg Oral Q breakfast  . simvastatin  20 mg Oral Daily  . sodium chloride flush  10 mL Intravenous Q12H    Vital Signs    Vitals:   03/01/19 2034 03/01/19 2257 03/02/19 0306 03/02/19 0723  BP: (!) 134/59 127/79 118/64 129/65  Pulse: (!) 51 (!) 107 (!) 53 94  Resp: 16 18 16 16   Temp: 98.9 F (37.2 C) 98.3 F (36.8 C) 98.3 F (36.8 C) 98.3 F (36.8 C)  TempSrc: Oral Oral Oral Oral  SpO2: 91% 93% 93% 92%  Weight:   61.5 kg   Height:        Intake/Output Summary (Last 24 hours) at 03/02/2019 0858 Last data filed at 03/02/2019 0456 Gross per 24 hour  Intake 480 ml  Output 1575 ml  Net -1095 ml   Filed Weights   02/27/19 0841 02/28/19 0402 03/02/19 0306  Weight: 62.6 kg 63.5 kg 61.5 kg    Physical Exam    GEN: Well nourished, well developed, in no acute distress.  HEENT: normal.  Neck: Supple, no JVD, carotid bruits, or masses. Cardiac: RRR, no murmurs, rubs, or gallops. No clubbing, cyanosis, edema.  Radials/DP/PT 2+ and equal bilaterally.  Respiratory:  Respirations regular and unlabored, clear  to auscultation bilaterally. GI: Soft, nontender, nondistended, BS + x 4. MS: no deformity or atrophy. Skin: warm and dry, no rash. Neuro:  Strength and sensation are intact. Psych: Normal affect.  Labs    CBC Recent Labs    02/28/19 0614  WBC 9.3  HGB 9.7*  HCT 29.4*  MCV 94.2  PLT 99991111   Basic Metabolic Panel Recent Labs    02/28/19 0614  NA 134*  K 4.2  CL 105  CO2 24  GLUCOSE 105*  BUN 18  CREATININE 0.85  CALCIUM 7.8*   Liver Function Tests No results for input(s): AST, ALT, ALKPHOS, BILITOT, PROT, ALBUMIN in the last 72 hours. No results for input(s): LIPASE, AMYLASE in the last 72 hours. Cardiac Enzymes No results for input(s): CKTOTAL, CKMB, CKMBINDEX, TROPONINI in the last 72 hours. BNP No results for input(s): BNP in the last 72 hours. D-Dimer No results for input(s): DDIMER in the last 72 hours. Hemoglobin A1C No results for input(s): HGBA1C in the last 72 hours. Fasting Lipid Panel No results for input(s): CHOL, HDL, LDLCALC, TRIG, CHOLHDL, LDLDIRECT in the last 72  hours. Thyroid Function Tests No results for input(s): TSH, T4TOTAL, T3FREE, THYROIDAB in the last 72 hours.  Invalid input(s): FREET3  Telemetry    afib with vaariable vr  ECG    afib with rvr  Radiology    Dg Chest Port 1 View  Result Date: 02/27/2019 CLINICAL DATA:  Shortness of breath EXAM: PORTABLE CHEST 1 VIEW COMPARISON:  07/23/2018 FINDINGS: The heart size and mediastinal contours are within normal limits. Both lungs are clear. The visualized skeletal structures are unremarkable. IMPRESSION: No active disease. Electronically Signed   By: Kathreen Devoid   On: 02/27/2019 03:59   Dg Knee Complete 4 Views Left  Result Date: 02/27/2019 CLINICAL DATA:  Bilateral knee pain status post fall EXAM: LEFT KNEE - COMPLETE 4+ VIEW COMPARISON:  None. FINDINGS: No acute fracture or dislocation. No aggressive osseous lesion. No joint effusion. Mild chondrocalcinosis of the medial and  lateral femorotibial compartments as can be seen with CPPD. Small medial femorotibial compartment marginal osteophytes. IMPRESSION: No acute osseous injury of the left knee. Electronically Signed   By: Kathreen Devoid   On: 02/27/2019 03:59   Dg Knee Complete 4 Views Right  Result Date: 02/27/2019 CLINICAL DATA:  Bilateral knee pain status post fall EXAM: RIGHT KNEE - COMPLETE 4+ VIEW COMPARISON:  None. FINDINGS: No acute fracture or dislocation. No aggressive osseous lesion. Large joint effusion. 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 Mild osteoarthritis of the medial femorotibial compartment with mild joint space narrowing and marginal osteophytes. IMPRESSION: 1.  No acute osseous injury of the right knee. 2. Large joint effusion. Electronically Signed   By: Kathreen Devoid   On: 02/27/2019 04:01    Assessment & Plan    Afib-rate in better control. Will d/c iv cardizem and continue with po cardizem at 240 mg daily. Continue with apixiban. Will follow as outpatient.   Signed, Javier Docker Fath MD 03/02/2019, 8:58 AM  Pager: [phone]

## 2019-03-02 NOTE — Plan of Care (Signed)
  Problem: Education: Goal: Knowledge of General Education information will improve Description: Including pain rating scale, medication(s)/side effects and non-pharmacologic comfort measures Outcome: Progressing   Problem: Clinical Measurements: Goal: Respiratory complications will improve Outcome: Progressing Goal: Cardiovascular complication will be avoided Outcome: Progressing   Problem: Education: Goal: Understanding of medication regimen will improve Outcome: Progressing

## 2019-03-02 NOTE — Progress Notes (Addendum)
Physical Therapy Treatment Patient Details Name: Kaitlyn Huff MRN: [id number] DOB: 1941/04/12 Today's Date: 03/02/2019    History of Present Illness 78 yo female who presented to ED with complaints of acute onset of palpitations, generalized weakness and dizziness, PMH includes anemia, HTN, OA, OP, RA, cancer    PT Comments    Stood and ambulated around unit x 1 with walker and min guard/supervision.  Returned to bed with supervision.  Hoping to discharge home today. Has walker at home and encouraged to use it upon discharge.    Follow Up Recommendations  Home health PT     Equipment Recommendations  Rolling walker with 5" wheels    Recommendations for Other Services       Precautions / Restrictions Precautions Precautions: Fall Restrictions Weight Bearing Restrictions: No    Mobility  Bed Mobility Overal bed mobility: Modified Independent                Transfers Overall transfer level: Modified independent Equipment used: Rolling walker (2 wheeled) Transfers: Sit to/from Stand Sit to Stand: Modified independent (Device/Increase time)         General transfer comment: min verbal cues for hand placements  Ambulation/Gait Ambulation/Gait assistance: Min guard Gait Distance (Feet): 200 Feet Assistive device: Rolling walker (2 wheeled) Gait Pattern/deviations: Step-through pattern Gait velocity: decreased       Stairs             Wheelchair Mobility    Modified Rankin (Stroke Patients Only)       Balance Overall balance assessment: Modified Independent                                          Cognition Arousal/Alertness: Awake/alert Behavior During Therapy: WFL for tasks assessed/performed Overall Cognitive Status: Within Functional Limits for tasks assessed                                        Exercises      General Comments        Pertinent Vitals/Pain Pain Score: 2  Pain Location:  left knee Pain Descriptors / Indicators: Sore;Tender Pain Intervention(s): Limited activity within patient's tolerance;Monitored during session    Home Living                      Prior Function            PT Goals (current goals can now be found in the care plan section) Progress towards PT goals: Progressing toward goals    Frequency    Min 2X/week      PT Plan      Co-evaluation              AM-PAC PT "6 Clicks" Mobility   Outcome Measure  Help needed turning from your back to your side while in a flat bed without using bedrails?: None Help needed moving from lying on your back to sitting on the side of a flat bed without using bedrails?: None Help needed moving to and from a bed to a chair (including a wheelchair)?: A Little Help needed standing up from a chair using your arms (e.g., wheelchair or bedside chair)?: A Little Help needed to walk in hospital room?: A Little Help needed climbing 3-5 steps with  a railing? : A Little 6 Click Score: 20    End of Session Equipment Utilized During Treatment: Gait belt Activity Tolerance: Patient tolerated treatment well Patient left: with call bell/phone within reach;in bed;with bed alarm set Nurse Communication: Mobility status       Time: [id number] PT Time Calculation (min) (ACUTE ONLY): 8 min  Charges:  $Gait Training: 8-22 mins                     Chesley Noon, PTA 03/02/19, 1:06 PM

## 2019-03-02 NOTE — Progress Notes (Signed)
[facility] [place] NAME: Kaitlyn Huff    MR#:  [id number]  DATE OF BIRTH:  [birth year]  SUBJECTIVE: Patient seen today Heart rate appears to be little better We will wean off Cardizem drip No complaints of palpitations and chest pain No shortness of breath  CHIEF COMPLAINT:   Chief Complaint  Patient presents with  . Near Syncope    REVIEW OF SYSTEMS:   ROS CONSTITUTIONAL: No fever, fatigue or weakness.  EYES: No blurred or double vision.  EARS, NOSE, AND THROAT: No tinnitus or ear pain.  RESPIRATORY: No cough, shortness of breath, wheezing or hemoptysis.  CARDIOVASCULAR: No chest pain, orthopnea, edema.  GASTROINTESTINAL: No nausea, vomiting, diarrhea or abdominal pain.  GENITOURINARY: No dysuria, hematuria.  ENDOCRINE: No polyuria, nocturia,  HEMATOLOGY: No anemia, easy bruising or bleeding SKIN: No rash or lesion. MUSCULOSKELETAL: No joint pain or arthritis.   NEUROLOGIC: No tingling, numbness, weakness.  PSYCHIATRY: No anxiety or depression.   DRUG ALLERGIES:   Allergies  Allergen Reactions  . Tramadol Itching  . Astelin [Azelastine Hcl] Other (See Comments)    Reaction:  Unknown   . Codeine Other (See Comments)    Reaction:  Altered mental status  . Dilaudid [Hydromorphone] Other (See Comments)    Reaction:  Unknown   . Flexeril [Cyclobenzaprine] Other (See Comments)    Reaction:  Unknown   . Imuran [Azathioprine] Other (See Comments)    Reaction:  Abnormal liver function  . Lisinopril Itching  . Lyrica [Pregabalin] Other (See Comments)    Reaction:  Sore gums   . Methotrexate Derivatives Other (See Comments)    Reaction:  Abnormal liver function  . Amiodarone Anxiety and Other (See Comments)    Pt could not eat or sleep. Caused n/v and "messed her up"  . Amoxicillin Rash and Other (See Comments)    Unable to obtain enough information to answer additional questions about this medication.    Kaitlyn Huff  [Leflunomide] Rash  . Clindamycin/Lincomycin Rash  . Doxycycline Rash  . Lodine [Etodolac] Rash  . Percocet [Oxycodone-Acetaminophen] Rash  . Sulfa Antibiotics Rash    VITALS:  Blood pressure 129/65, pulse 94, temperature 98.3 F (36.8 C), temperature source Oral, resp. rate 16, height 5\' 4"  (1.626 m), weight 61.5 kg, SpO2 92 %.  PHYSICAL EXAMINATION:  GENERAL:  78 y.o.-year-old patient lying in the bed with no acute distress.  EYES: Pupils equal, round, reactive to light and accommodation. No scleral icterus. Extraocular muscles intact.  HEENT: Head atraumatic, normocephalic. Oropharynx and nasopharynx clear.  NECK:  Supple, no jugular venous distention. No thyroid enlargement, no tenderness.  LUNGS: Normal breath sounds bilaterally, no wheezing, rales,rhonchi or crepitation. No use of accessory muscles of respiration.  CARDIOVASCULAR: S1, S2 irregular. No murmurs, rubs, or gallops.  ABDOMEN: Soft, nontender, nondistended. Bowel sounds present. No organomegaly or mass.  EXTREMITIES: No pedal edema, cyanosis, or clubbing.  NEUROLOGIC: Cranial nerves II through XII are intact. Muscle strength 5/5 in all extremities. Sensation intact. Gait not checked.  PSYCHIATRIC: The patient is alert and oriented x 3.  SKIN: No obvious rash, lesion, or ulcer.    LABORATORY PANEL:   CBC Recent Labs  Lab 02/28/19 0614  WBC 9.3  HGB 9.7*  HCT 29.4*  PLT 189   ------------------------------------------------------------------------------------------------------------------  Chemistries  Recent Labs  Lab 02/27/19 0355 02/28/19 0614  NA 132* 134*  K 3.2* 4.2  CL 97* 105  CO2 28 24  GLUCOSE 115*  105*  BUN 14 18  CREATININE 0.89 0.85  CALCIUM 8.7* 7.8*  AST 42*  --   ALT 33  --   ALKPHOS 45  --   BILITOT 0.9  --    ------------------------------------------------------------------------------------------------------------------  Cardiac Enzymes No results for input(s): TROPONINI  in the last 168 hours. ------------------------------------------------------------------------------------------------------------------  RADIOLOGY:  No results found.  EKG:   Orders placed or performed during the hospital encounter of 02/27/19  . EKG 12-Lead  . EKG 12-Lead  . ED EKG 12-Lead  . ED EKG 12-Lead  . EKG 12-Lead  . EKG 12-Lead    ASSESSMENT AND PLAN:  78 year old female patient with story of GERD, hyperlipidemia, hypertension, osteoarthritis, osteoporosis, rheumatoid arthritis currently under hospitalist service .  1.A. fib with RVR  Wean Cardizem drip Continue oral Cardizem Physical therapy evaluation Status post cardiology follow-up Continue Eliquis for anticoagulation  2.  Trauma to right knee and developed infection there, so far the cultures are negative from arthrocentesis done in the emergency room  Oral Levaquin antibiotic on board  proCalcitonin decreased from 0.32-0.18.   3.  Difficulty walking secondary to knee pain, now better, physical therapy consult requested home health physical therapy.  4.  History of rheumatoid arthritis,  patient is on chronic prednisone  5.  DVT prophylaxis Continue Eliquis for anticoagulation  All the records are reviewed and case discussed with Care Management/Social Workerr. Management plans discussed with the patient, family and they are in agreement.  CODE STATUS: Full code  TOTAL TIME TAKING CARE OF THIS PATIENT:36 minutes.   POSSIBLE D/C IN 1-2 DAYS, DEPENDING ON CLINICAL CONDITION.   Kaitlyn Huff M.D on 03/02/2019 at 12:00 PM  Between 7am to 6pm - Pager - [phone]  After 6pm go to [email or web address] - password [email or web address] [facility]  Office  [phone]  CC: Primary care physician; Kaitlyn Pheasant, MD   Note: This dictation was prepared with Dragon dictation along with smaller phrase technology. Any transcriptional errors that result from this process are unintentional.

## 2019-03-02 NOTE — Progress Notes (Signed)
Advanced care plan. Purpose of the Encounter: CODE STATUS Parties in Attendance: Patient Patient's Decision Capacity: Good Subjective/Patient's story: Kaitlyn Huff  is a 78 y.o. Caucasian female with a known history of multiple medical problems that will be mentioned below, who presented to the emergency room with acute onset of palpitations and generalized weakness with dizziness.  The patient has been having bilateral knee pain, left more than right.  She bumped her knees on a cabinet about a week ago before her pain started and worsened.  She admits to dyspnea on exertion without cough or wheezing or fever or chills chest pain.  She had nausea and vomiting but denied any current symptoms.  No diarrhea or melena or bright red bleeding per rectum.  She denies any abdominal pain, dysuria, oliguria or hematuria or flank pain. Upon presentation to the emergency room, her heart rate was 121 and later 143 with a blood pressure of 108/55 and pulse ox symmetry of 90 to 92% on room air 1 low-grade fever of 100.5.  Labs revealed mild hypokalemia with potassium of 3.2 lactic acid was 1.1.  CBC showed WBC of 11.3 with leukocytosis and mild anemia.  INR was 1.6 with PT of 18.8.  Blood cultures were withdrawn.  EKG showed atrial flutter with RVR of 142 with no acute changes. She had a left knee arthrocentesis and was empirically started on IV vancomycin, cefepime and Flagyl.  She was also given 650 mg p.o. Tylenol as well as 500 mils IV normal saline bolus, 4 mg of IV Zofran. For her atrial fibrillation she was given 10 mg of IV Cardizem bolus followed by IV drip of 5 mg/h and her rate is in down to the 90s.  She will be admitted to telemetry bed for further evaluation and management. Objective/Medical story Patient needs evaluation for atrial fibrillation Needs cardiology consultation echocardiogram She needs IV Cardizem drip for rate control and switch to oral meds Goals of care determination:  Advance care  directives goals of care treatment plan discussed For now patient wants everything done which includes CPR, intubation ventilator if the need arises CODE STATUS: Full code Time spent discussing advanced care planning: 16 minutes

## 2019-03-02 NOTE — Progress Notes (Signed)
Daughter Amy Zenia Resides called to get an update on patient condition. Informed her that patient was resting. Cardizem drip is still in place. No complaints of pain.

## 2019-03-03 MED ORDER — LEVOFLOXACIN 500 MG PO TABS: 500.0000 mg | ORAL_TABLET | Freq: Every day | ORAL | 0 refills | Status: AC

## 2019-03-03 NOTE — Progress Notes (Signed)
Pt discharged to home via wc.  Instructions  given to pt.  Questions answered.  No distress.  

## 2019-03-03 NOTE — Discharge Summary (Signed)
[facility] [place] NAME: Kaitlyn Huff    MR#:  [id number]  DATE OF BIRTH:  April 19, 1941  DATE OF ADMISSION:  02/27/2019 ADMITTING PHYSICIAN: Christel Mormon, MD  DATE OF DISCHARGE: 03/03/2019  PRIMARY CARE PHYSICIAN: [name], MD   ADMISSION DIAGNOSIS:  Atrial fibrillation with rapid ventricular response (HCC) [I48.91] Effusion of left knee [M25.462] Sepsis without acute organ dysfunction, due to unspecified organism ([place]) [A41.9]  DISCHARGE DIAGNOSIS:  Active Problems:   Atrial fibrillation with rapid ventricular response (HCC) Knee effusion status post arthrocentesis Rheumatoid arthritis Hyperlipidemia Hypertension  SECONDARY DIAGNOSIS:   Past Medical History:  Diagnosis Date  . Anemia   . Cancer ([place])   . Diverticulitis   . GERD (gastroesophageal reflux disease)   . Hyperlipidemia   . Hypertension   . Osteoarthritis   . Osteoporosis    actonel  . Positive PPD    s/p INH (2006)  . Rheumatoid arthritis(714.0)    MTX transaminitis, Leflunomide (rash), enbrel, plaquinil, prednisone, remicade, Imuran (transaminitis)  . Valvular heart disease    moderate MR and TR     ADMITTING HISTORY Kaitlyn Huff  is a 78 y.o. Caucasian female with a known history of multiple medical problems that will be mentioned below, who presented to the emergency room with acute onset of palpitations and generalized weakness with dizziness.  The patient has been having bilateral knee pain, left more than right.  She bumped her knees on a cabinet about a week ago before her pain started and worsened.  She admits to dyspnea on exertion without cough or wheezing or fever or chills chest pain.  She had nausea and vomiting but denied any current symptoms.  No diarrhea or melena or bright red bleeding per rectum.  She denies any abdominal pain, dysuria, oliguria or hematuria or flank pain. Upon presentation to the emergency room, her heart rate was 121 and  later 143 with a blood pressure of 108/55 and pulse ox symmetry of 90 to 92% on room air 1 low-grade fever of 100.5.  Labs revealed mild hypokalemia with potassium of 3.2 lactic acid was 1.1.  CBC showed WBC of 11.3 with leukocytosis and mild anemia.  INR was 1.6 with PT of 18.8.  Blood cultures were withdrawn.  EKG showed atrial flutter with RVR of 142 with no acute changes. She had a left knee arthrocentesis and was empirically started on IV vancomycin, cefepime and Flagyl.  She was also given 650 mg p.o. Tylenol as well as 500 mils IV normal saline bolus, 4 mg of IV Zofran. For her atrial fibrillation she was given 10 mg of IV Cardizem bolus followed by IV drip of 5 mg/h and her rate is in down to the 90s.  She will be admitted to telemetry bed for further evaluation and management.  HOSPITAL COURSE:  Patient was admitted to telemetry started on IV Cardizem drip for rate control.  She had a left knee arthrocentesis and empirically started on IV vancomycin cefepime and Flagyl antibiotics.  Once heart rate was better controlled she was switched to oral Cardizem and IV Cardizem was weaned off.  Cardiology evaluated the patient during hospitalization.  She was anticoagulated with oral Eliquis.  Tolerated antibiotics well.  Urine culture grew insignificant growth.. Blood culture did not reveal any growth.  And was switched to oral Levaquin antibiotic.  Procalcitonin levels decreased during hospitalization.  Patient is on chronic prednisone for rheumatoid arthritis.  CONSULTS OBTAINED:  Treatment Team:  Teodoro Spray, MD Yolonda Kida, MD  DRUG ALLERGIES:   Allergies  Allergen Reactions  . Tramadol Itching  . Astelin [Azelastine Hcl] Other (See Comments)    Reaction:  Unknown   . Codeine Other (See Comments)    Reaction:  Altered mental status  . Dilaudid [Hydromorphone] Other (See Comments)    Reaction:  Unknown   . Flexeril [Cyclobenzaprine] Other (See Comments)    Reaction:  Unknown    . Imuran [Azathioprine] Other (See Comments)    Reaction:  Abnormal liver function  . Lisinopril Itching  . Lyrica [Pregabalin] Other (See Comments)    Reaction:  Sore gums   . Methotrexate Derivatives Other (See Comments)    Reaction:  Abnormal liver function  . Amiodarone Anxiety and Other (See Comments)    Pt could not eat or sleep. Caused n/v and "messed her up"  . Amoxicillin Rash and Other (See Comments)    Unable to obtain enough information to answer additional questions about this medication.    Jolee Ewing [Leflunomide] Rash  . Clindamycin/Lincomycin Rash  . Doxycycline Rash  . Lodine [Etodolac] Rash  . Percocet [Oxycodone-Acetaminophen] Rash  . Sulfa Antibiotics Rash    DISCHARGE MEDICATIONS:   Allergies as of 03/03/2019      Reactions   Tramadol Itching   Astelin [azelastine Hcl] Other (See Comments)   Reaction:  Unknown    Codeine Other (See Comments)   Reaction:  Altered mental status   Dilaudid [hydromorphone] Other (See Comments)   Reaction:  Unknown    Flexeril [cyclobenzaprine] Other (See Comments)   Reaction:  Unknown    Imuran [azathioprine] Other (See Comments)   Reaction:  Abnormal liver function   Lisinopril Itching   Lyrica [pregabalin] Other (See Comments)   Reaction:  Sore gums    Methotrexate Derivatives Other (See Comments)   Reaction:  Abnormal liver function   Amiodarone Anxiety, Other (See Comments)   Pt could not eat or sleep. Caused n/v and "messed her up"   Amoxicillin Rash, Other (See Comments)   Unable to obtain enough information to answer additional questions about this medication.     Arava [leflunomide] Rash   Clindamycin/lincomycin Rash   Doxycycline Rash   Lodine [etodolac] Rash   Percocet [oxycodone-acetaminophen] Rash   Sulfa Antibiotics Rash      Medication List    TAKE these medications   acetaminophen 325 MG tablet Commonly known as: TYLENOL Take 2 tablets (650 mg total) by mouth every 6 (six) hours as needed for mild  pain (or Fever >/= 101).   acidophilus Caps capsule Take 1 capsule by mouth daily.   ALIGN PO Take 1 tablet by mouth daily.   albuterol 108 (90 Base) MCG/ACT inhaler Commonly known as: ProAir HFA USE 2 INHALATIONS EVERY 6 HOURS AS NEEDED FOR WHEEZING OR SHORTNESS OF BREATH   alendronate 70 MG tablet Commonly known as: FOSAMAX Take 70 mg by mouth once a week.   ALPRAZolam 0.25 MG tablet Commonly known as: XANAX Take 1 tablet (0.25 mg total) by mouth daily as needed for anxiety.   aspirin EC 81 MG tablet Take 81 mg by mouth daily.   busPIRone 5 MG tablet Commonly known as: BUSPAR Take 1 tablet (5 mg total) by mouth at bedtime as needed.   Calcium 600+D 600-400 MG-UNIT tablet Generic drug: Calcium Carbonate-Vitamin D Take 1 tablet by mouth 2 (two) times daily.   cetirizine 10 MG tablet Commonly known as: ZYRTEC TAKE 1 TABLET DAILY  cholecalciferol 1000 units tablet Commonly known as: VITAMIN D Take 1,000 Units by mouth daily.   colestipol 1 g tablet Commonly known as: COLESTID Take 2 g by mouth daily.   diltiazem 240 MG 24 hr capsule Commonly known as: CARDIZEM CD Take 1 capsule (240 mg total) by mouth daily.   Eliquis 5 MG Tabs tablet Generic drug: apixaban Take 5 mg by mouth 2 (two) times daily.   esomeprazole 20 MG capsule Commonly known as: NEXIUM Take 1 capsule (20 mg total) by mouth daily at 12 noon.   fluticasone 50 MCG/ACT nasal spray Commonly known as: FLONASE Place 2 sprays into both nostrils daily.   furosemide 20 MG tablet Commonly known as: LASIX Take 20 mg by mouth daily.   guaiFENesin-dextromethorphan 100-10 MG/5ML syrup Commonly known as: ROBITUSSIN DM Take 5 mLs by mouth every 4 (four) hours as needed for cough.   levofloxacin 500 MG tablet Commonly known as: Levaquin Take 1 tablet (500 mg total) by mouth daily for 3 days.   lip balm Oint Apply 1 application topically as needed for lip care.   meclizine 25 MG tablet Commonly  known as: ANTIVERT take 1/2 to 1 tablet every 8 hours if needed for VERTIGO   multivitamin with minerals Tabs tablet Take 1 tablet by mouth daily.   ondansetron 4 MG disintegrating tablet Commonly known as: Zofran ODT Take 1 tablet (4 mg total) by mouth every 8 (eight) hours as needed for nausea or vomiting.   potassium chloride 10 MEQ CR capsule Commonly known as: MICRO-K TAKE 2 CAPSULES BY MOUTH EVERY DAY   predniSONE 5 MG tablet Commonly known as: DELTASONE Take 1 tablet (5 mg total) by mouth daily with breakfast. What changed: how much to take   simvastatin 20 MG tablet Commonly known as: ZOCOR TAKE 1 TABLET DAILY   sodium chloride 0.65 % nasal spray Commonly known as: OCEAN Place 1 spray into the nose as needed for congestion.   Tandem Plus 162-115.2-1 MG Caps Take 1 capsule by mouth daily.   triamcinolone cream 0.1 % Commonly known as: KENALOG Apply 1 application topically 2 (two) times daily. Prn       Today  Patient seen today Heart rate well controlled Tolerating diet well Hemodynamically stable VITAL SIGNS:  Blood pressure 125/60, pulse 71, temperature 98.2 F (36.8 C), temperature source Oral, resp. rate 19, height 5\' 4"  (1.626 m), weight 59.8 kg, SpO2 93 %.  I/O:    Intake/Output Summary (Last 24 hours) at 03/03/2019 1241 Last data filed at 03/03/2019 0935 Gross per 24 hour  Intake 483 ml  Output 1525 ml  Net -1042 ml    PHYSICAL EXAMINATION:  Physical Exam  GENERAL:  78 y.o.-year-old patient lying in the bed with no acute distress.  LUNGS: Normal breath sounds bilaterally, no wheezing, rales,rhonchi or crepitation. No use of accessory muscles of respiration.  CARDIOVASCULAR: S1, S2 normal. No murmurs, rubs, or gallops.  ABDOMEN: Soft, non-tender, non-distended. Bowel sounds present. No organomegaly or mass.  NEUROLOGIC: Moves all 4 extremities. PSYCHIATRIC: The patient is alert and oriented x 3.  SKIN: No obvious rash, lesion, or ulcer.    DATA REVIEW:   CBC Recent Labs  Lab 02/28/19 0614  WBC 9.3  HGB 9.7*  HCT 29.4*  PLT 189    Chemistries  Recent Labs  Lab 02/27/19 0355 02/28/19 0614  NA 132* 134*  K 3.2* 4.2  CL 97* 105  CO2 28 24  GLUCOSE 115* 105*  BUN 14  18  CREATININE 0.89 0.85  CALCIUM 8.7* 7.8*  AST 42*  --   ALT 33  --   ALKPHOS 45  --   BILITOT 0.9  --     Cardiac Enzymes No results for input(s): TROPONINI in the last 168 hours.  Microbiology Results  Results for orders placed or performed during the hospital encounter of 02/27/19  Blood Culture (routine x 2)     Status: None (Preliminary result)   Collection Time: 02/27/19  3:55 AM   Specimen: BLOOD  Result Value Ref Range Status   Specimen Description BLOOD RIGHT ASSIST CONTROL  Final   Special Requests   Final    BOTTLES DRAWN AEROBIC AND ANAEROBIC Blood Culture results may not be optimal due to an excessive volume of blood received in culture bottles   Culture   Final    NO GROWTH 4 DAYS Performed at [facility], [address] [place], [place] [address]    Report Status PENDING  Incomplete  Blood Culture (routine x 2)     Status: None (Preliminary result)   Collection Time: 02/27/19  3:56 AM   Specimen: BLOOD  Result Value Ref Range Status   Specimen Description BLOOD RIGHT WRIST  Final   Special Requests   Final    BOTTLES DRAWN AEROBIC AND ANAEROBIC Blood Culture adequate volume   Culture   Final    NO GROWTH 4 DAYS Performed at [facility], [address] [place], [place] [address]    Report Status PENDING  Incomplete  Urine culture     Status: Abnormal   Collection Time: 02/27/19  3:56 AM   Specimen: Urine, Random  Result Value Ref Range Status   Specimen Description   Final    URINE, RANDOM Performed at [facility], [address], [place], [place] [address]    Special Requests   Final    NONE Performed at [facility], [address] [place], [place]  [address]    Culture (A)  Final    <10,000 COLONIES/mL INSIGNIFICANT GROWTH Performed at [facility], [place] [address] [place],  [address]    Report Status 02/28/2019 FINAL  Final  SARS Coronavirus 2 [facility] order, Performed in [facility] hospital lab) Nasopharyngeal Urine, Clean Catch     Status: None   Collection Time: 02/27/19  3:56 AM   Specimen: Urine, Clean Catch; Nasopharyngeal  Result Value Ref Range Status   SARS Coronavirus 2 NEGATIVE NEGATIVE Final    Comment: (NOTE) If result is NEGATIVE SARS-CoV-2 target nucleic acids are NOT DETECTED. The SARS-CoV-2 RNA is generally detectable in upper and lower  respiratory specimens during the acute phase of infection. The lowest  concentration of SARS-CoV-2 viral copies this assay can detect is 250  copies / mL. A negative result does not preclude SARS-CoV-2 infection  and should not be used as the sole basis for treatment or other  patient management decisions.  A negative result may occur with  improper specimen collection / handling, submission of specimen other  than nasopharyngeal swab, presence of viral mutation(s) within the  areas targeted by this assay, and inadequate number of viral copies  (<250 copies / mL). A negative result must be combined with clinical  observations, patient history, and epidemiological information. If result is POSITIVE SARS-CoV-2 target nucleic acids are DETECTED. The SARS-CoV-2 RNA is generally detectable in upper and lower  respiratory specimens dur ing the acute phase of infection.  Positive  results are indicative of active infection with SARS-CoV-2.  Clinical  correlation with patient history and other diagnostic information is  necessary to determine patient infection status.  Positive results do  not rule out bacterial infection or co-infection with other viruses. If result is PRESUMPTIVE POSTIVE SARS-CoV-2 nucleic acids MAY BE PRESENT.   A presumptive positive result was  obtained on the submitted specimen  and confirmed on repeat testing.  While 2019 novel coronavirus  (SARS-CoV-2) nucleic acids may be present in the submitted sample  additional confirmatory testing may be necessary for epidemiological  and / or clinical management purposes  to differentiate between  SARS-CoV-2 and other Sarbecovirus currently known to infect humans.  If clinically indicated additional testing with an alternate test  methodology [phone]) is advised. The SARS-CoV-2 RNA is generally  detectable in upper and lower respiratory sp ecimens during the acute  phase of infection. The expected result is Negative. Fact Sheet for Patients:  StrictlyIdeas.no Fact Sheet for Healthcare Providers: BankingDealers.co.za This test is not yet approved or cleared by the [place] FDA and has been authorized for detection and/or diagnosis of SARS-CoV-2 by FDA under an Emergency Use Authorization (EUA).  This EUA will remain in effect (meaning this test can be used) for the duration of the COVID-19 declaration under Section 564(b)(1) of the Act, 21 U.S.C. section 360bbb-3(b)(1), unless the authorization is terminated or revoked sooner. Performed at [facility], [address], [place], [place] [address]   Body fluid culture     Status: None   Collection Time: 02/27/19  5:55 AM   Specimen: Synovium; Body Fluid  Result Value Ref Range Status   Specimen Description   Final    SYNOVIAL Performed at [facility], [address], [place], [place] [address]    Special Requests   Final    Immunocompromised Performed at [facility], [place], [place] [address]    Gram Stain   Final    ABUNDANT WBC PRESENT, PREDOMINANTLY PMN NO ORGANISMS SEEN    Culture   Final    NO GROWTH Performed at [facility], [place] [address], [place], [place] [address]    Report Status 03/02/2019 FINAL  Final     RADIOLOGY:  No results found.  Follow up with PCP in 1 week.  Management plans discussed with the patient, family and they are in agreement.  CODE STATUS: Full code    Code Status Orders  (From admission, onward)         Start     Ordered   02/27/19 0616  Full code  Continuous     02/27/19 0620        Code Status History    Date Active Date Inactive Code Status Order ID Comments User Context   06/24/2017 0655 06/26/2017 1640 Full Code [id number]  Harrie Foreman, MD Inpatient   12/22/2016 1725 12/23/2016 1640 Partial Code [id number]  Hillary Bow, MD ED   12/22/2016 1710 12/22/2016 1725 Full Code [id number]  Hillary Bow, MD ED   10/31/2016 1757 11/02/2016 1519 Full Code [id number]  Demetrios Loll, MD Inpatient   06/19/2016 1320 06/21/2016 1529 DNR [id number]  Loletha Grayer, MD Inpatient   06/17/2016 0201 06/19/2016 1320 Full Code [id number]  Saundra Shelling, MD ED   08/18/2015 1628 08/22/2015 1615 Full Code [id number]  Hower, Aaron Mose, MD ED   08/03/2015 0949 08/07/2015 1411 Full Code [id number]  Saundra Shelling, MD Inpatient   [facility] Care Planning Activity  TOTAL TIME TAKING CARE OF THIS PATIENT ON DAY OF DISCHARGE: more than 35 minutes.   Saundra Shelling M.D on 03/03/2019 at 12:41 PM  Between 7am to 6pm - Pager - [phone]  After 6pm go to [email or web address] - password [email or web address] [facility]  Office  [phone]  CC: Primary care physician; [name], MD  Note: This dictation was prepared with Dragon dictation along with smaller phrase technology. Any transcriptional errors that result from this process are unintentional.

## 2019-03-03 NOTE — TOC Transition Note (Signed)
Transition of Care [facility]) - CM/SW Discharge Note   Patient Details  Name: Kaitlyn Huff MRN: [id number] Date of Birth: Dec 04, 1940  [facility] CM/SW Contact:  Latanya Maudlin, RN Phone Number: 03/03/2019, 10:40 AM   Clinical Narrative:  Patient to be discharged per MD order. Orders in place for home health services. Patient was [facility]. Notified Dan with [facility] of plans for discharge. No DME needs. Family to transport.      Final next level of care: Home w Home Health Services Barriers to Discharge: No Barriers Identified   Patient Goals and CMS Choice   CMS Medicare.gov Compare Post Acute Care list provided to:: Patient Choice offered to / list presented to : Patient  Discharge Placement                       Discharge Plan and Services In-house Referral: Clinical Social Work   Post Acute Care Choice: Home Health          DME Arranged: N/A DME Agency: NA       HH Arranged: RN, PT, Nurse's Aide [place] Agency: [place] ([facility]) Date [place]: 03/03/19 Time [place]: [place] Representative spoke with at [place]: [place] ([place]) Interventions     Readmission Risk Interventions No flowsheet data found.

## 2019-03-03 NOTE — Plan of Care (Signed)
  Problem: Education: Goal: Knowledge of General Education information will improve Description: Including pain rating scale, medication(s)/side effects and non-pharmacologic comfort measures Outcome: Completed/Met   Problem: Health Behavior/Discharge Planning: Goal: Ability to manage health-related needs will improve Outcome: Completed/Met   Problem: Clinical Measurements: Goal: Ability to maintain clinical measurements within normal limits will improve Outcome: Completed/Met Goal: Will remain free from infection Outcome: Completed/Met Goal: Diagnostic test results will improve Outcome: Completed/Met Goal: Respiratory complications will improve Outcome: Completed/Met Goal: Cardiovascular complication will be avoided Outcome: Completed/Met   Problem: Activity: Goal: Risk for activity intolerance will decrease Outcome: Completed/Met   Problem: Safety: Goal: Ability to remain free from injury will improve Outcome: Completed/Met   Problem: Education: Goal: Knowledge of disease or condition will improve Outcome: Completed/Met Goal: Understanding of medication regimen will improve Outcome: Completed/Met Goal: Individualized Educational Video(s) Outcome: Completed/Met   Problem: Activity: Goal: Ability to tolerate increased activity will improve Outcome: Completed/Met   Problem: Cardiac: Goal: Ability to achieve and maintain adequate cardiopulmonary perfusion will improve Outcome: Completed/Met

## 2019-03-04 ENCOUNTER — Telehealth: Payer: Self-pay | Admitting: Internal Medicine

## 2019-03-04 LAB — CULTURE, BLOOD (ROUTINE X 2)
Culture: NO GROWTH
Culture: NO GROWTH
Special Requests: ADEQUATE

## 2019-03-04 NOTE — Telephone Encounter (Signed)
Will patient need TCM?

## 2019-03-04 NOTE — Telephone Encounter (Signed)
Dc summary states for her to continue   81 mg aspirin daily along with eliquis as prescribed by MD

## 2019-03-04 NOTE — Telephone Encounter (Signed)
HFU/ Dx was sepsis/ Afib/ Pt was discharged from [facility] on 03/03/2019 to home. Pt is scheduled for HFU on 03/14/2019 @ 10:30 am.

## 2019-03-04 NOTE — Telephone Encounter (Signed)
Transition Care Management Follow-up Telephone Call  How have you been since you were released from the hospital? Patient says she is feeling better than before hospital swelling in knee has come down, and she is able to ADL's on her own. Last pulse was 80, patient daughter said she patient was advised on DC to hold aspirin but medication list for home meds includes 81 mg aspirin patient currently taking Eliquis ok to take aspirin or should patient hold aspirin.   Do you understand why you were in the hospital? yes   Do you understand the discharge instrcutions? yes  Items Reviewed:  Medications reviewed: yes  Allergies reviewed: yes  Dietary changes reviewed: yes  Referrals reviewed: yes   Functional Questionnaire:   Activities of Daily Living (ADLs):   She states they are independent in the following: ambulation, bathing and hygiene, feeding, continence, grooming, toileting and dressing States they require assistance with the following: No assistance needed.   Any transportation issues/concerns?: no   Any patient concerns? yes   Confirmed importance and date/time of follow-up visits scheduled: yes   Confirmed with patient if condition begins to worsen call PCP or go to the ER.  Patient was given the Call-a-Nurse line [phone]: yes

## 2019-03-05 DIAGNOSIS — K219 Gastro-esophageal reflux disease without esophagitis: Secondary | ICD-10-CM | POA: Diagnosis not present

## 2019-03-05 DIAGNOSIS — E785 Hyperlipidemia, unspecified: Secondary | ICD-10-CM | POA: Diagnosis not present

## 2019-03-05 DIAGNOSIS — I4891 Unspecified atrial fibrillation: Secondary | ICD-10-CM | POA: Diagnosis not present

## 2019-03-05 DIAGNOSIS — D649 Anemia, unspecified: Secondary | ICD-10-CM | POA: Diagnosis not present

## 2019-03-05 DIAGNOSIS — Z7982 Long term (current) use of aspirin: Secondary | ICD-10-CM | POA: Diagnosis not present

## 2019-03-05 DIAGNOSIS — M25462 Effusion, left knee: Secondary | ICD-10-CM | POA: Diagnosis not present

## 2019-03-05 DIAGNOSIS — Z7983 Long term (current) use of bisphosphonates: Secondary | ICD-10-CM | POA: Diagnosis not present

## 2019-03-05 DIAGNOSIS — Z7952 Long term (current) use of systemic steroids: Secondary | ICD-10-CM | POA: Diagnosis not present

## 2019-03-05 DIAGNOSIS — I081 Rheumatic disorders of both mitral and tricuspid valves: Secondary | ICD-10-CM | POA: Diagnosis not present

## 2019-03-05 DIAGNOSIS — I10 Essential (primary) hypertension: Secondary | ICD-10-CM | POA: Diagnosis not present

## 2019-03-05 DIAGNOSIS — M069 Rheumatoid arthritis, unspecified: Secondary | ICD-10-CM | POA: Diagnosis not present

## 2019-03-05 DIAGNOSIS — Z7901 Long term (current) use of anticoagulants: Secondary | ICD-10-CM | POA: Diagnosis not present

## 2019-03-05 DIAGNOSIS — M810 Age-related osteoporosis without current pathological fracture: Secondary | ICD-10-CM | POA: Diagnosis not present

## 2019-03-05 DIAGNOSIS — M1990 Unspecified osteoarthritis, unspecified site: Secondary | ICD-10-CM | POA: Diagnosis not present

## 2019-03-05 NOTE — Telephone Encounter (Signed)
Patient has already spoke with Dr Ubaldo Glassing and he recommended that she d/c aspirin and continue eliquis

## 2019-03-05 NOTE — Telephone Encounter (Signed)
Reviewed. She is followed by cardiology.  I would recommend her calling Dr Bethanne Ginger office and confirming with them if she is to continue both eliquis and aspirin.

## 2019-03-06 ENCOUNTER — Telehealth: Payer: Self-pay | Admitting: Internal Medicine

## 2019-03-06 NOTE — Telephone Encounter (Signed)
Caller/Agency: Amy Pope/ [facility] Number: [phone] Requesting OT/PT/Skilled Nursing/Social Work/Speech Therapy: Skilled nursing Frequency: 1x a week for 4 weeks. 1x a week, every other week, for 5 weeks.    Ms.Pope would also like to make PCP aware in their computer system it is showing a level 1 interaction for the diltiazem and simvastatin patient is taking.

## 2019-03-06 NOTE — Telephone Encounter (Signed)
Called Amy Pope at [facility] and left vm and gave verbal orders for skilled nursing as requested.  Please see Ms. Pope's message regarding pt's drug interaction.

## 2019-03-08 ENCOUNTER — Telehealth: Payer: Self-pay | Admitting: Internal Medicine

## 2019-03-08 DIAGNOSIS — M25462 Effusion, left knee: Secondary | ICD-10-CM | POA: Diagnosis not present

## 2019-03-08 DIAGNOSIS — I4891 Unspecified atrial fibrillation: Secondary | ICD-10-CM | POA: Diagnosis not present

## 2019-03-08 DIAGNOSIS — I081 Rheumatic disorders of both mitral and tricuspid valves: Secondary | ICD-10-CM | POA: Diagnosis not present

## 2019-03-08 DIAGNOSIS — M069 Rheumatoid arthritis, unspecified: Secondary | ICD-10-CM | POA: Diagnosis not present

## 2019-03-08 DIAGNOSIS — I10 Essential (primary) hypertension: Secondary | ICD-10-CM | POA: Diagnosis not present

## 2019-03-08 DIAGNOSIS — M1990 Unspecified osteoarthritis, unspecified site: Secondary | ICD-10-CM | POA: Diagnosis not present

## 2019-03-08 NOTE — Telephone Encounter (Signed)
Noted.  Will discuss medications with pt at upcoming appt

## 2019-03-08 NOTE — Telephone Encounter (Signed)
Verbals given  

## 2019-03-08 NOTE — Telephone Encounter (Signed)
Raquel Sarna calling from [facility] called and stated that she would like verbals PT 1x3.

## 2019-03-08 NOTE — Telephone Encounter (Signed)
Raquel Sarna calling from [facility] called and stated that she would like verbals PT 1x3.

## 2019-03-11 DIAGNOSIS — M069 Rheumatoid arthritis, unspecified: Secondary | ICD-10-CM | POA: Diagnosis not present

## 2019-03-11 DIAGNOSIS — M25462 Effusion, left knee: Secondary | ICD-10-CM | POA: Diagnosis not present

## 2019-03-11 DIAGNOSIS — M1990 Unspecified osteoarthritis, unspecified site: Secondary | ICD-10-CM | POA: Diagnosis not present

## 2019-03-11 DIAGNOSIS — I081 Rheumatic disorders of both mitral and tricuspid valves: Secondary | ICD-10-CM | POA: Diagnosis not present

## 2019-03-11 DIAGNOSIS — I4891 Unspecified atrial fibrillation: Secondary | ICD-10-CM | POA: Diagnosis not present

## 2019-03-11 DIAGNOSIS — I10 Essential (primary) hypertension: Secondary | ICD-10-CM | POA: Diagnosis not present

## 2019-03-13 DIAGNOSIS — M1990 Unspecified osteoarthritis, unspecified site: Secondary | ICD-10-CM | POA: Diagnosis not present

## 2019-03-13 DIAGNOSIS — I10 Essential (primary) hypertension: Secondary | ICD-10-CM | POA: Diagnosis not present

## 2019-03-13 DIAGNOSIS — M069 Rheumatoid arthritis, unspecified: Secondary | ICD-10-CM | POA: Diagnosis not present

## 2019-03-13 DIAGNOSIS — I081 Rheumatic disorders of both mitral and tricuspid valves: Secondary | ICD-10-CM | POA: Diagnosis not present

## 2019-03-13 DIAGNOSIS — M25462 Effusion, left knee: Secondary | ICD-10-CM | POA: Diagnosis not present

## 2019-03-13 DIAGNOSIS — I4891 Unspecified atrial fibrillation: Secondary | ICD-10-CM | POA: Diagnosis not present

## 2019-03-14 ENCOUNTER — Other Ambulatory Visit: Payer: Self-pay

## 2019-03-14 ENCOUNTER — Ambulatory Visit (INDEPENDENT_AMBULATORY_CARE_PROVIDER_SITE_OTHER): Payer: Medicare Other | Admitting: Internal Medicine

## 2019-03-14 DIAGNOSIS — M069 Rheumatoid arthritis, unspecified: Secondary | ICD-10-CM

## 2019-03-14 DIAGNOSIS — D649 Anemia, unspecified: Secondary | ICD-10-CM | POA: Diagnosis not present

## 2019-03-14 DIAGNOSIS — R600 Localized edema: Secondary | ICD-10-CM | POA: Diagnosis not present

## 2019-03-14 DIAGNOSIS — K219 Gastro-esophageal reflux disease without esophagitis: Secondary | ICD-10-CM | POA: Diagnosis not present

## 2019-03-14 DIAGNOSIS — I10 Essential (primary) hypertension: Secondary | ICD-10-CM | POA: Diagnosis not present

## 2019-03-14 DIAGNOSIS — G479 Sleep disorder, unspecified: Secondary | ICD-10-CM

## 2019-03-14 DIAGNOSIS — I4891 Unspecified atrial fibrillation: Secondary | ICD-10-CM | POA: Diagnosis not present

## 2019-03-14 DIAGNOSIS — E7800 Pure hypercholesterolemia, unspecified: Secondary | ICD-10-CM | POA: Diagnosis not present

## 2019-03-14 DIAGNOSIS — F419 Anxiety disorder, unspecified: Secondary | ICD-10-CM | POA: Diagnosis not present

## 2019-03-14 DIAGNOSIS — R197 Diarrhea, unspecified: Secondary | ICD-10-CM | POA: Diagnosis not present

## 2019-03-14 NOTE — Progress Notes (Signed)
Patient ID: Kaitlyn Huff, female   DOB: 01/03/1941, 78 y.o.   MRN: [id number]   Virtual Visit via telephone Note  This visit type was conducted due to national recommendations for restrictions regarding the COVID-19 pandemic (e.g. social distancing).  This format is felt to be most appropriate for this patient at this time.  All issues noted in this document were discussed and addressed.  No physical exam was performed (except for noted visual exam findings with Video Visits).   I connected with Kaitlyn Huff by telephone and verified that I am speaking with the correct person using two identifiers. Location patient: home Location provider: work  Persons participating in the telephone visit: patient, provider  I discussed the limitations, risks, security and privacy concerns of performing an evaluation and management service by telephone and the availability of in person appointments. The patient expressed understanding and agreed to proceed.   Reason for visit: hospital follow up.   HPI: She was admitted 02/27/19 - 03/03/19 with acute onset of palpitations, generalized weakness and dizziness.  Sates she passed out.  Had been having bilateral knee pain.  Noticed some sob with exertion.  Presented to ER with low grade fever increased heart rae and pulse ox 90-92%.  Had left knee arthrocentesis and started on IV abx.  Found to be in afib.  IV cardizem started.  Heart rate controlled.  Changed to oral cardizem.  Started on eliquis.  Blood cultures negative.  Changed to oral levaquin.  Discharged home with home health.  Knee swelling has resolved.  No chest pain.  Breathing overall stable.  She does feel better.  Still weak, but improved.  Some soft stool.  Taking align.  No abdominal pain or diarrhea.  States had formed stool last pm.  Appetite improved.     ROS: See pertinent positives and negatives per HPI.  Past Medical History:  Diagnosis Date  . Anemia   . Cancer ([place])   . Diverticulitis    . GERD (gastroesophageal reflux disease)   . Hyperlipidemia   . Hypertension   . Osteoarthritis   . Osteoporosis    actonel  . Positive PPD    s/p INH (2006)  . Rheumatoid arthritis(714.0)    MTX transaminitis, Leflunomide (rash), enbrel, plaquinil, prednisone, remicade, Imuran (transaminitis)  . Valvular heart disease    moderate MR and TR    Past Surgical History:  Procedure Laterality Date  . ABDOMINAL HYSTERECTOMY    . CERVICAL CONE BIOPSY    . CHOLECYSTECTOMY  06/22/14  . COLONOSCOPY WITH PROPOFOL N/A 07/09/2015   Procedure: COLONOSCOPY WITH PROPOFOL;  Surgeon: Manya Silvas, MD;  Location: [facility] ENDOSCOPY;  Service: Endoscopy;  Laterality: N/A;  . ESOPHAGOGASTRODUODENOSCOPY (EGD) WITH PROPOFOL N/A 07/09/2015   Procedure: ESOPHAGOGASTRODUODENOSCOPY (EGD) WITH PROPOFOL;  Surgeon: Manya Silvas, MD;  Location: [facility] ENDOSCOPY;  Service: Endoscopy;  Laterality: N/A;  . OVARY SURGERY    . TRACHEOSTOMY  1959  . TUBAL LIGATION      Family History  Problem Relation Age of Onset  . Heart disease Father        MI  . Heart disease Mother   . Valvular heart disease Mother   . Breast cancer Sister 69  . Cancer Sister        Lung cancer  . COPD Sister   . Heart disease Sister   . Colon cancer Neg Hx     SOCIAL HX: reviewed.    Current Outpatient Medications:  .  acetaminophen (  TYLENOL) 325 MG tablet, Take 2 tablets (650 mg total) by mouth every 6 (six) hours as needed for mild pain (or Fever >/= 101)., Disp: , Rfl:  .  acidophilus (RISAQUAD) CAPS capsule, Take 1 capsule by mouth daily., Disp: , Rfl:  .  albuterol (PROAIR HFA) 108 (90 Base) MCG/ACT inhaler, USE 2 INHALATIONS EVERY 6 HOURS AS NEEDED FOR WHEEZING OR SHORTNESS OF BREATH, Disp: 51 g, Rfl: 3 .  alendronate (FOSAMAX) 70 MG tablet, Take 70 mg by mouth once a week. , Disp: , Rfl:  .  ALPRAZolam (XANAX) 0.25 MG tablet, Take 1 tablet (0.25 mg total) by mouth daily as needed for anxiety., Disp: 30 tablet, Rfl: 0 .   apixaban (ELIQUIS) 5 MG TABS tablet, Take 5 mg by mouth 2 (two) times daily., Disp: , Rfl:  .  busPIRone (BUSPAR) 5 MG tablet, Take 1 tablet (5 mg total) by mouth at bedtime as needed. (Patient not taking: Reported on 02/27/2019), Disp: 30 tablet, Rfl: 1 .  Calcium Carbonate-Vitamin D (CALCIUM 600+D) 600-400 MG-UNIT tablet, Take 1 tablet by mouth 2 (two) times daily. , Disp: , Rfl:  .  cetirizine (ZYRTEC) 10 MG tablet, TAKE 1 TABLET DAILY, Disp: 90 tablet, Rfl: 4 .  cholecalciferol (VITAMIN D) 1000 units tablet, Take 1,000 Units by mouth daily., Disp: , Rfl:  .  colestipol (COLESTID) 1 g tablet, Take 2 g by mouth daily. , Disp: , Rfl:  .  diltiazem (CARDIZEM CD) 240 MG 24 hr capsule, Take 1 capsule (240 mg total) by mouth daily., Disp: 30 capsule, Rfl: 0 .  esomeprazole (NEXIUM) 20 MG capsule, Take 1 capsule (20 mg total) by mouth daily at 12 noon., Disp: 90 capsule, Rfl: 0 .  FeFum-FePo-FA-B Cmp-C-Zn-Mn-Cu (TANDEM PLUS) 162-115.2-1 MG CAPS, Take 1 capsule by mouth daily. (Patient not taking: Reported on 02/27/2019), Disp: 90 each, Rfl: 1 .  fluticasone (FLONASE) 50 MCG/ACT nasal spray, Place 2 sprays into both nostrils daily., Disp: 48 g, Rfl: 1 .  furosemide (LASIX) 20 MG tablet, Take 20 mg by mouth daily., Disp: , Rfl:  .  guaiFENesin-dextromethorphan (ROBITUSSIN DM) 100-10 MG/5ML syrup, Take 5 mLs by mouth every 4 (four) hours as needed for cough. (Patient not taking: Reported on 08/03/2018), Disp: 118 mL, Rfl: 0 .  lip balm (BLISTEX) OINT, Apply 1 application topically as needed for lip care., Disp: 1 Tube, Rfl: 0 .  meclizine (ANTIVERT) 25 MG tablet, take 1/2 to 1 tablet every 8 hours if needed for VERTIGO, Disp: 40 tablet, Rfl: 3 .  Multiple Vitamin (MULTIVITAMIN WITH MINERALS) TABS tablet, Take 1 tablet by mouth daily., Disp: , Rfl:  .  ondansetron (ZOFRAN ODT) 4 MG disintegrating tablet, Take 1 tablet (4 mg total) by mouth every 8 (eight) hours as needed for nausea or vomiting., Disp: 20  tablet, Rfl: 0 .  potassium chloride (MICRO-K) 10 MEQ CR capsule, TAKE 2 CAPSULES BY MOUTH EVERY DAY, Disp: 180 capsule, Rfl: 1 .  predniSONE (DELTASONE) 5 MG tablet, Take 1 tablet (5 mg total) by mouth daily with breakfast. (Patient taking differently: Take 2.5 mg by mouth daily with breakfast. ), Disp: , Rfl:  .  Probiotic Product (ALIGN PO), Take 1 tablet by mouth daily., Disp: , Rfl:  .  simvastatin (ZOCOR) 20 MG tablet, TAKE 1 TABLET DAILY, Disp: 90 tablet, Rfl: 4 .  sodium chloride (OCEAN) 0.65 % nasal spray, Place 1 spray into the nose as needed for congestion., Disp: , Rfl:  .  triamcinolone cream (  KENALOG) 0.1 %, Apply 1 application topically 2 (two) times daily. Prn, Disp: 454 g, Rfl: 0  EXAM:  VITALS per patient if applicable:  A999333, 62, A999333  GENERAL: alert, oriented, appears well and in no acute distress  HEENT: atraumatic, conjunttiva clear, no obvious abnormalities on inspection of external nose and ears  NECK: normal movements of the head and neck  LUNGS: on inspection no signs of respiratory distress, breathing rate appears normal, no obvious gross SOB, gasping or wheezing  CV: no obvious cyanosis  PSYCH/NEURO: pleasant and cooperative, no obvious depression or anxiety, speech and thought processing grossly intact  ASSESSMENT AND PLAN:  Discussed the following assessment and plan:  A-fib (HCC) Off amiodarone.  Recent admission and found to be in afib with RVR.  Has been followed by cardiology.  On oral cardizem.  On eliquis.  Heart rate controlled.  Follow.    Anemia Follow cbc and ferritin.    Anxiety Increased anxiety.  Reports having to take xanax bid on some days recently.  Discussed with her regarding my desire to get her off xanax.  Would like to avoid increasing dose.  Have referred her to psychiatry.  Call and clarify appt.    Diarrhea Has had issues with diarrhea previously.  Has been treated with colestipol.  Stool more formed yesterday.  Follow.     Difficulty sleeping Persistent issue as outlined.  Refer back to psychiatry.    GERD (gastroesophageal reflux disease) Controlled on current regimen.  Follow.    Hypercholesterolemia On simvastatin.  Low cholesterol diet and exercise.  Follow lipid panel and liver function tests.    Hypertension Blood pressure under good control.  Continue same medication regimen.  Follow pressures.  Follow metabolic panel.    Lower extremity edema Improved.  Reports only occasional right ankle swelling.  Follow.    Rheumatoid arthritis Followed by Dr [name].  Recently admitted with swollen/painful knee.  Treated with IV abx.  Follow.  No swelling now.      I discussed the assessment and treatment plan with the patient. The patient was provided an opportunity to ask questions and all were answered. The patient agreed with the plan and demonstrated an understanding of the instructions.   The patient was advised to call back or seek an in-person evaluation if the symptoms worsen or if the condition fails to improve as anticipated.  I provided 23 minutes of non-face-to-face time during this encounter.   [name], MD

## 2019-03-18 ENCOUNTER — Encounter: Payer: Self-pay | Admitting: Internal Medicine

## 2019-03-18 NOTE — Assessment & Plan Note (Signed)
Controlled on current regimen.  Follow.  

## 2019-03-18 NOTE — Assessment & Plan Note (Signed)
Followed by Dr [name].  Recently admitted with swollen/painful knee.  Treated with IV abx.  Follow.  No swelling now.

## 2019-03-18 NOTE — Assessment & Plan Note (Signed)
Persistent issue as outlined.  Refer back to psychiatry.

## 2019-03-18 NOTE — Assessment & Plan Note (Signed)
Increased anxiety.  Reports having to take xanax bid on some days recently.  Discussed with her regarding my desire to get her off xanax.  Would like to avoid increasing dose.  Have referred her to psychiatry.  Call and clarify appt.

## 2019-03-18 NOTE — Assessment & Plan Note (Signed)
Has had issues with diarrhea previously.  Has been treated with colestipol.  Stool more formed yesterday.  Follow.

## 2019-03-18 NOTE — Assessment & Plan Note (Signed)
Follow cbc and ferritin.  

## 2019-03-18 NOTE — Assessment & Plan Note (Signed)
On simvastatin.  Low cholesterol diet and exercise.  Follow lipid panel and liver function tests.   

## 2019-03-18 NOTE — Assessment & Plan Note (Signed)
Blood pressure under good control.  Continue same medication regimen.  Follow pressures.  Follow metabolic panel.   

## 2019-03-18 NOTE — Assessment & Plan Note (Signed)
Off amiodarone.  Recent admission and found to be in afib with RVR.  Has been followed by cardiology.  On oral cardizem.  On eliquis.  Heart rate controlled.  Follow.

## 2019-03-18 NOTE — Assessment & Plan Note (Signed)
Improved.  Reports only occasional right ankle swelling.  Follow.

## 2019-03-19 DIAGNOSIS — I4891 Unspecified atrial fibrillation: Secondary | ICD-10-CM | POA: Diagnosis not present

## 2019-03-19 DIAGNOSIS — I10 Essential (primary) hypertension: Secondary | ICD-10-CM | POA: Diagnosis not present

## 2019-03-19 DIAGNOSIS — M1990 Unspecified osteoarthritis, unspecified site: Secondary | ICD-10-CM | POA: Diagnosis not present

## 2019-03-19 DIAGNOSIS — M069 Rheumatoid arthritis, unspecified: Secondary | ICD-10-CM | POA: Diagnosis not present

## 2019-03-19 DIAGNOSIS — M25462 Effusion, left knee: Secondary | ICD-10-CM | POA: Diagnosis not present

## 2019-03-19 DIAGNOSIS — I081 Rheumatic disorders of both mitral and tricuspid valves: Secondary | ICD-10-CM | POA: Diagnosis not present

## 2019-03-20 DIAGNOSIS — I059 Rheumatic mitral valve disease, unspecified: Secondary | ICD-10-CM | POA: Diagnosis not present

## 2019-03-20 DIAGNOSIS — I10 Essential (primary) hypertension: Secondary | ICD-10-CM | POA: Diagnosis not present

## 2019-03-20 DIAGNOSIS — I480 Paroxysmal atrial fibrillation: Secondary | ICD-10-CM | POA: Diagnosis not present

## 2019-03-20 DIAGNOSIS — I38 Endocarditis, valve unspecified: Secondary | ICD-10-CM | POA: Diagnosis not present

## 2019-03-20 DIAGNOSIS — G4733 Obstructive sleep apnea (adult) (pediatric): Secondary | ICD-10-CM | POA: Diagnosis not present

## 2019-03-20 DIAGNOSIS — E782 Mixed hyperlipidemia: Secondary | ICD-10-CM | POA: Diagnosis not present

## 2019-03-21 DIAGNOSIS — I10 Essential (primary) hypertension: Secondary | ICD-10-CM | POA: Diagnosis not present

## 2019-03-21 DIAGNOSIS — I4891 Unspecified atrial fibrillation: Secondary | ICD-10-CM | POA: Diagnosis not present

## 2019-03-21 DIAGNOSIS — M069 Rheumatoid arthritis, unspecified: Secondary | ICD-10-CM | POA: Diagnosis not present

## 2019-03-21 DIAGNOSIS — M25462 Effusion, left knee: Secondary | ICD-10-CM | POA: Diagnosis not present

## 2019-03-21 DIAGNOSIS — M1990 Unspecified osteoarthritis, unspecified site: Secondary | ICD-10-CM | POA: Diagnosis not present

## 2019-03-21 DIAGNOSIS — I081 Rheumatic disorders of both mitral and tricuspid valves: Secondary | ICD-10-CM | POA: Diagnosis not present

## 2019-03-27 ENCOUNTER — Telehealth: Payer: Self-pay

## 2019-03-27 DIAGNOSIS — I081 Rheumatic disorders of both mitral and tricuspid valves: Secondary | ICD-10-CM | POA: Diagnosis not present

## 2019-03-27 DIAGNOSIS — F4323 Adjustment disorder with mixed anxiety and depressed mood: Secondary | ICD-10-CM | POA: Diagnosis not present

## 2019-03-27 DIAGNOSIS — F5105 Insomnia due to other mental disorder: Secondary | ICD-10-CM | POA: Diagnosis not present

## 2019-03-27 DIAGNOSIS — M069 Rheumatoid arthritis, unspecified: Secondary | ICD-10-CM | POA: Diagnosis not present

## 2019-03-27 DIAGNOSIS — I4891 Unspecified atrial fibrillation: Secondary | ICD-10-CM | POA: Diagnosis not present

## 2019-03-27 DIAGNOSIS — M25462 Effusion, left knee: Secondary | ICD-10-CM | POA: Diagnosis not present

## 2019-03-27 DIAGNOSIS — I10 Essential (primary) hypertension: Secondary | ICD-10-CM | POA: Diagnosis not present

## 2019-03-27 DIAGNOSIS — M1990 Unspecified osteoarthritis, unspecified site: Secondary | ICD-10-CM | POA: Diagnosis not present

## 2019-03-27 NOTE — Telephone Encounter (Signed)
-----   Message from [name], MD sent at 03/14/2019 11:05 AM EDT ----- Regarding: Dr Nicolasa Ducking appt Please call Dr Waylan Boga office and see when her appt is scheduled.  She states she does not have one scheduled.  Pt with increased anxiety.  Thanks    Dr Nicki Reaper

## 2019-03-27 NOTE — Telephone Encounter (Signed)
Patient is being seen by Dr Nicolasa Ducking today. Nothing further needed with this message, just wanted you to be aware

## 2019-03-29 ENCOUNTER — Other Ambulatory Visit: Payer: Self-pay

## 2019-03-29 ENCOUNTER — Ambulatory Visit: Payer: Self-pay

## 2019-03-29 ENCOUNTER — Encounter: Payer: Self-pay | Admitting: Emergency Medicine

## 2019-03-29 ENCOUNTER — Emergency Department
Admission: EM | Admit: 2019-03-29 | Discharge: 2019-03-29 | Disposition: A | Discharge status: Home / Self Care | Payer: Medicare Other | Attending: Emergency Medicine | Admitting: Emergency Medicine

## 2019-03-29 DIAGNOSIS — M7989 Other specified soft tissue disorders: Secondary | ICD-10-CM | POA: Diagnosis present

## 2019-03-29 DIAGNOSIS — M069 Rheumatoid arthritis, unspecified: Secondary | ICD-10-CM

## 2019-03-29 DIAGNOSIS — Z79899 Other long term (current) drug therapy: Secondary | ICD-10-CM | POA: Diagnosis not present

## 2019-03-29 DIAGNOSIS — M06041 Rheumatoid arthritis without rheumatoid factor, right hand: Secondary | ICD-10-CM | POA: Insufficient documentation

## 2019-03-29 DIAGNOSIS — I10 Essential (primary) hypertension: Secondary | ICD-10-CM | POA: Insufficient documentation

## 2019-03-29 DIAGNOSIS — Z7901 Long term (current) use of anticoagulants: Secondary | ICD-10-CM | POA: Diagnosis not present

## 2019-03-29 DIAGNOSIS — I4891 Unspecified atrial fibrillation: Secondary | ICD-10-CM | POA: Insufficient documentation

## 2019-03-29 LAB — COMPREHENSIVE METABOLIC PANEL
ALT: 25 U/L (ref 0–44)
AST: 36 U/L (ref 15–41)
Albumin: 4.3 g/dL (ref 3.5–5.0)
Alkaline Phosphatase: 48 U/L (ref 38–126)
Anion gap: 12 (ref 5–15)
BUN: 11 mg/dL (ref 8–23)
CO2: 26 mmol/L (ref 22–32)
Calcium: 9.3 mg/dL (ref 8.9–10.3)
Chloride: 99 mmol/L (ref 98–111)
Creatinine, Ser: 0.96 mg/dL (ref 0.44–1.00)
GFR calc Af Amer: >60 mL/min (ref >60)
GFR calc non Af Amer: 58 mL/min — ABNORMAL LOW (ref >60)
Glucose, Bld: 124 mg/dL — ABNORMAL HIGH (ref 70–99)
Potassium: 3.8 mmol/L (ref 3.5–5.1)
Sodium: 137 mmol/L (ref 135–145)
Total Bilirubin: 0.7 mg/dL (ref 0.3–1.2)
Total Protein: 8 g/dL (ref 6.5–8.1)

## 2019-03-29 LAB — CBC
HCT: 35.5 % — ABNORMAL LOW (ref 36.0–46.0)
Hemoglobin: 11.9 g/dL — ABNORMAL LOW (ref 12.0–15.0)
MCH: 30.3 pg (ref 26.0–34.0)
MCHC: 33.5 g/dL (ref 30.0–36.0)
MCV: 90.3 fL (ref 80.0–100.0)
Platelets: 252 10*3/uL (ref 150–400)
RBC: 3.93 MIL/uL (ref 3.87–5.11)
RDW: 12.7 % (ref 11.5–15.5)
WBC: 11.2 10*3/uL — ABNORMAL HIGH (ref 4.0–10.5)
nRBC: 0 % (ref 0.0–0.2)

## 2019-03-29 LAB — TSH: TSH: 1.33 u[IU]/mL (ref 0.350–4.500)

## 2019-03-29 LAB — TROPONIN I (HIGH SENSITIVITY)
Troponin I (High Sensitivity): 56 ng/L — ABNORMAL HIGH (ref <18)
Troponin I (High Sensitivity): 63 ng/L — ABNORMAL HIGH (ref <18)

## 2019-03-29 LAB — T4, FREE: Free T4: 0.82 ng/dL (ref 0.61–1.12)

## 2019-03-29 LAB — LACTIC ACID, PLASMA: Lactic Acid, Venous: 1.6 mmol/L (ref 0.5–1.9)

## 2019-03-29 LAB — BRAIN NATRIURETIC PEPTIDE: B Natriuretic Peptide: 212 pg/mL — ABNORMAL HIGH (ref 0.0–100.0)

## 2019-03-29 MED ORDER — DICLOFENAC SODIUM 1 % TD GEL: 4.0000 g | Freq: Four times a day (QID) | TRANSDERMAL | 0 refills | Status: AC

## 2019-03-29 MED ORDER — PREDNISONE 10 MG PO TABS: 10.0000 mg | ORAL_TABLET | Freq: Every day | ORAL | 0 refills | Status: DC

## 2019-03-29 NOTE — Telephone Encounter (Signed)
Per the providers request. I informed the patient she will need to go to the ED. The patient informed me she will not be able to go until 3:00 today when she will have a ride. I stated that I will let her doctor know.

## 2019-03-29 NOTE — ED Provider Notes (Signed)
[facility] Emergency Department Provider Note  ____________________________________________   First MD Initiated Contact with Patient 03/29/19 1812     (approximate)  I have reviewed the triage vital signs and the nursing notes.   HISTORY  Chief Complaint Leg Swelling    HPI Kaitlyn Huff is a 78 y.o. female with rheumatoid arthritis who has been off her Remicade due to concern for sepsis who now presents with leg swelling and right thumb swelling.  Patient said that she had a right knee arthrocentesis done.  Given she had a low-grade fever elevated lactate and heart rates were elevated there was confirmed concern for sepsis.  The knee culture never grew any bacteria.  Blood cultures were negative, urine culture was negative.  Patient received a course of antibiotics.  Patient has had to have fluid drained out of that knee multiple times.  She now has a little bit of swelling in her right thumb.  Range of motion is intact.  Patient is afebrile.  She has some pitting edema in her bilateral lower extremity.  Patient is on Lasix 40 once a day.  This was increased recently during her admission.  She denies any shortness of breath.  Denies any chest pain.  She is having good urine output with this.  She had an echocardiogram done while inpatient that was normal.     Pt is on 5mg  prednisone.    Patient had recent admission on 8/19 for sepsis.  Her heart rates were between 120 and 140 with a lactate of 3 and a low-grade fever of 100.5 she had a left knee arthrocentesis that was negative.   Past Medical History:  Diagnosis Date  . Anemia   . Cancer ([place])   . Diverticulitis   . GERD (gastroesophageal reflux disease)   . Hyperlipidemia   . Hypertension   . Osteoarthritis   . Osteoporosis    actonel  . Positive PPD    s/p INH (2006)  . Rheumatoid arthritis(714.0)    MTX transaminitis, Leflunomide (rash), enbrel, plaquinil, prednisone, remicade, Imuran  (transaminitis)  . Valvular heart disease    moderate MR and TR    Patient Active Problem List   Diagnosis Date Noted  . Atrial fibrillation with rapid ventricular response ([place]) 02/27/2019  . DJD (degenerative joint disease), lumbosacral 04/02/2018  . Nodule of groin 04/01/2018  . Fatty liver 03/29/2018  . Anxiety 01/19/2018  . Difficulty sleeping 01/19/2018  . Acute urticaria 12/21/2017  . Colitis 06/24/2017  . Dizziness 10/01/2016  . A-fib ([place]) 07/12/2016  . 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 Chronic venous insufficiency 04/25/2016  . Pain in limb 04/25/2016  . Swelling of limb 04/25/2016  . Tachycardia 03/22/2016  . Foot fracture 03/18/2016  . Cough 01/27/2016  . Hypoxia 12/20/2015  . Lower extremity edema 12/18/2015  . Diarrhea 09/15/2015  . Restless leg syndrome 08/07/2015  . History of colonic polyps 07/10/2015  . Health care maintenance 01/11/2015  . Weight loss 08/04/2013  . Obstructive sleep apnea 12/23/2012  . Dilated bile duct 12/23/2012  . Hypercholesterolemia 08/12/2012  . Rheumatoid arthritis ([place]) 08/12/2012  . GERD (gastroesophageal reflux disease) 08/12/2012  . Hypertension 08/12/2012  . Anemia 08/12/2012    Past Surgical History:  Procedure Laterality Date  . ABDOMINAL HYSTERECTOMY    . CERVICAL CONE BIOPSY    . CHOLECYSTECTOMY  06/22/14  . COLONOSCOPY WITH PROPOFOL N/A 07/09/2015   Procedure: COLONOSCOPY WITH PROPOFOL;  Surgeon: Gavin Pound  Vira Agar, MD;  Location: [facility];  Service: Endoscopy;  Laterality: N/A;  . ESOPHAGOGASTRODUODENOSCOPY (EGD) WITH PROPOFOL N/A 07/09/2015   Procedure: ESOPHAGOGASTRODUODENOSCOPY (EGD) WITH PROPOFOL;  Surgeon: Manya Silvas, MD;  Location[facility] ENDOSCOPY;  Service: Endoscopy;  Laterality: N/A;  . OVARY SURGERY    . TRACHEOSTOMY  1959  . TUBAL LIGATION      Prior to Admission medications   Medication Sig Start Date End Date Taking? Authorizing Provider   acetaminophen (TYLENOL) 325 MG tablet Take 2 tablets (650 mg total) by mouth every 6 (six) hours as needed for mild pain (or Fever >/= 101). 06/26/17   Gouru, Illene Silver, MD  acidophilus (RISAQUAD) CAPS capsule Take 1 capsule by mouth daily.    [provider]  albuterol (PROAIR HFA) 108 (90 Base) MCG/ACT inhaler USE 2 INHALATIONS EVERY 6 HOURS AS NEEDED FOR WHEEZING OR SHORTNESS OF BREATH 06/14/18   [name], MD  alendronate (FOSAMAX) 70 MG tablet Take 70 mg by mouth once a week.  09/04/15   [provider]  ALPRAZolam [name]) 0.25 MG tablet Take 1 tablet (0.25 mg total) by mouth daily as needed for anxiety. 02/20/19   [name], MD  apixaban (ELIQUIS) 5 MG TABS tablet Take 5 mg by mouth 2 (two) times daily.    [provider]  busPIRone (BUSPAR) 5 MG tablet Take 1 tablet (5 mg total) by mouth at bedtime as needed. Patient not taking: Reported on 02/27/2019 07/12/18   [name], MD  Calcium Carbonate-Vitamin D (CALCIUM 600+D) 600-400 MG-UNIT tablet Take 1 tablet by mouth 2 (two) times daily.     [provider]  cetirizine (ZYRTEC) 10 MG tablet TAKE 1 TABLET DAILY 04/10/18   [name], MD  cholecalciferol (VITAMIN D) 1000 units tablet Take 1,000 Units by mouth daily.    [provider]  colestipol (COLESTID) 1 g tablet Take 2 g by mouth daily.     [provider]  diltiazem (CARDIZEM CD) 240 MG 24 hr capsule Take 1 capsule (240 mg total) by mouth daily. 03/01/19   Epifanio Lesches, MD  esomeprazole (NEXIUM) 20 MG capsule Take 1 capsule (20 mg total) by mouth daily at 12 noon. 09/06/18   [name], MD  FeFum-FePo-FA-B Cmp-C-Zn-Mn-Cu (TANDEM PLUS) 162-115.2-1 MG CAPS Take 1 capsule by mouth daily. Patient not taking: Reported on 02/27/2019 05/21/18   [name], MD  fluticasone [facility]) 50 MCG/ACT nasal spray Place 2 sprays into both nostrils daily. 02/19/19   [name], MD  furosemide (LASIX) 20 MG tablet Take 20  mg by mouth daily.    [provider]  guaiFENesin-dextromethorphan (ROBITUSSIN DM) 100-10 MG/5ML syrup Take 5 mLs by mouth every 4 (four) hours as needed for cough. Patient not taking: Reported on 08/03/2018 08/07/15   Theodoro Grist, MD  lip balm (BLISTEX) OINT Apply 1 application topically as needed for lip care. 06/21/16   Loletha Grayer, MD  meclizine (ANTIVERT) 25 MG tablet take 1/2 to 1 tablet every 8 hours if needed for VERTIGO 11/17/17   [name], MD  Multiple Vitamin (MULTIVITAMIN WITH MINERALS) TABS tablet Take 1 tablet by mouth daily.    [provider]  ondansetron (ZOFRAN ODT) 4 MG disintegrating tablet Take 1 tablet (4 mg total) by mouth every 8 (eight) hours as needed for nausea or vomiting. 11/19/16   Lavonia Drafts, MD  potassium chloride (MICRO-K) 10 MEQ CR capsule TAKE 2 CAPSULES BY MOUTH EVERY DAY 11/22/18   [name], MD  predniSONE (DELTASONE)  5 MG tablet Take 1 tablet (5 mg total) by mouth daily with breakfast. Patient taking differently: Take 2.5 mg by mouth daily with breakfast.  12/23/16   Bettey Costa, MD  Probiotic Product (ALIGN PO) Take 1 tablet by mouth daily.    [provider]  simvastatin (ZOCOR) 20 MG tablet TAKE 1 TABLET DAILY 07/18/18   [name], MD  sodium chloride (OCEAN) 0.65 % nasal spray Place 1 spray into the nose as needed for congestion.    [provider]  triamcinolone cream (KENALOG) 0.1 % Apply 1 application topically 2 (two) times daily. Prn 12/21/17   McLean-Scocuzza, Nino Glow, MD    Allergies Tramadol, Astelin [azelastine hcl], Codeine, Dilaudid [hydromorphone], Flexeril [cyclobenzaprine], Imuran [azathioprine], Lisinopril, Lyrica [pregabalin], Methotrexate derivatives, Amiodarone, Amoxicillin, Arava [leflunomide], Clindamycin/lincomycin, Doxycycline, Lodine [etodolac], Percocet [oxycodone-acetaminophen], and Sulfa antibiotics  Family History  Problem Relation Age of Onset  . Heart disease Father         MI  . Heart disease Mother   . Valvular heart disease Mother   . Breast cancer Sister 64  . Cancer Sister        Lung cancer  . COPD Sister   . Heart disease Sister   . Colon cancer Neg Hx     Social History Social History   Tobacco Use  . Smoking status: Never Smoker  . Smokeless tobacco: Never Used  Substance Use Topics  . Alcohol use: No    Alcohol/week: 0.0 standard drinks  . Drug use: No      Review of Systems Constitutional: No fever/chills Eyes: No visual changes. ENT: No sore throat. Cardiovascular: Denies chest pain. Respiratory: Denies shortness of breath. Gastrointestinal: No abdominal pain.  No nausea, no vomiting.  No diarrhea.  No constipation. Genitourinary: Negative for dysuria. Musculoskeletal: Negative for back pain.  Positive right thumb swelling.  Lower extremity edema Skin: Negative for rash. Neurological: Negative for headaches, focal weakness or numbness. All other ROS negative ____________________________________________   PHYSICAL EXAM:  VITAL SIGNS: ED Triage Vitals  Enc Vitals Group     BP 03/29/19 1518 (!) 140/93     Pulse Rate 03/29/19 1518 (!) 124     Resp 03/29/19 1518 20     Temp 03/29/19 1518 98.2 F (36.8 C)     Temp Source 03/29/19 1518 Oral     SpO2 03/29/19 1518 97 %     Weight 03/29/19 1519 122 lb (55.3 kg)     Height 03/29/19 1519 5\' 5"  (1.651 m)     Head Circumference --      Peak Flow --      Pain Score 03/29/19 1519 4     Pain Loc --      Pain Edu? --      Excl. in [place]? --     Constitutional: Alert and oriented. Well appearing and in no acute distress. Eyes: Conjunctivae are normal. EOMI. Head: Atraumatic. Nose: No congestion/rhinnorhea. Mouth/Throat: Mucous membranes are moist.   Neck: No stridor. Trachea Midline. FROM Cardiovascular: Irregular.  Grossly normal heart sounds.  Good peripheral circulation. Respiratory: Normal respiratory effort.  No retractions. Lungs CTAB. Gastrointestinal: Soft and  nontender. No distention. No abdominal bruits.  Musculoskeletal: 1+ edema bilaterally.  No joint effusions.  Swelling at the MCP of her right thumb some warmth very mild erythema around the thumb.  No redness tracking up the arm.  Full range of motion of the joint. Neurologic:  Normal speech and language. No gross focal neurologic deficits  are appreciated.  Skin:  Skin is warm, dry and intact. No rash noted. Psychiatric: Mood and affect are normal. Speech and behavior are normal. GU: Deferred   ____________________________________________   LABS (all labs ordered are listed, but only abnormal results are displayed)  Labs Reviewed  COMPREHENSIVE METABOLIC PANEL - Abnormal; Notable for the following components:      Result Value   Glucose, Bld 124 (*)    GFR calc non Af Amer 58 (*)    All other components within normal limits  CBC - Abnormal; Notable for the following components:   WBC 11.2 (*)    Hemoglobin 11.9 (*)    HCT 35.5 (*)    All other components within normal limits  BRAIN NATRIURETIC PEPTIDE - Abnormal; Notable for the following components:   B Natriuretic Peptide 212.0 (*)    All other components within normal limits  TROPONIN I (HIGH SENSITIVITY) - Abnormal; Notable for the following components:   Troponin I (High Sensitivity) 56 (*)    All other components within normal limits  TROPONIN I (HIGH SENSITIVITY) - Abnormal; Notable for the following components:   Troponin I (High Sensitivity) 63 (*)    All other components within normal limits  CULTURE, BLOOD (SINGLE)  LACTIC ACID, PLASMA  TSH  T4, FREE   ____________________________________________   ED ECG REPORT I, Vanessa Waldorf, the attending physician, personally viewed and interpreted this ECG.  EKG A. fib rate of 110, no ST elevation, no T wave inversion, normal intervals   INITIAL IMPRESSION / ASSESSMENT AND PLAN / ED COURSE  Kaitlyn Huff was evaluated in Emergency Department on 03/29/2019 for the  symptoms described in the history of present illness. She was evaluated in the context of the global COVID-19 pandemic, which necessitated consideration that the patient might be at risk for infection with the SARS-CoV-2 virus that causes COVID-19. Institutional protocols and algorithms that pertain to the evaluation of patients at risk for COVID-19 are in a state of rapid change based on information released by regulatory bodies including the CDC and federal and state organizations. These policies and algorithms were followed during the patient's care in the ED.    Patient is a well-appearing 78 year old who presents with right thumb swelling and warmth and concern given she had a recent infection of her left knee.  Reviewed patient's records patient at that time, and septic with fevers and elevated lactate but her blood cultures and knee culture were all negative.  I suspect that this swelling is more likely secondary to her rheumatoid arthritis and inflammatory reaction.  She has no evidence of fever, slight white count of elevation due to her chronic prednisone use, and her heart rates are well controlled A. Fib.  I have very low suspicion this is a septic joint.  She is also no has no history of gout or pseudogout.  Will do a short prednisone taper for patient and some voltaren gell.  Patient has some NSAIDs she can take at home.   For the lower extremity edema she has 1+ edema bilaterally.  She already had a normal echo.  Will get thyroid studies to evaluate for hypothyroidism.  She has no evidence of liver dysfunction or kidney dysfunction.  This could be secondary to the prednisone use.  Given the normal electrolytes I recommended patient go up on her Lasix to twice daily for 3 days to see if this helps improve her symptoms.   troponin was 56-63.  Unclear why this  was ordered by triage.  Patient is not having any chest pain.  These are lower numbers than when she was admitted previously.  She denies  any chest pain and she is in A. fib and has intermittent episodes of RVR at home but she takes a pill in the pocket for.  Given she is asymptomatic and likely this needs further work-up at this time.  TSH and T4 are normal.   Discussed with patient return to the ER for increasing redness spitting up her arm or fevers or any other concerns.  They feel comfortable with this plan.  They will follow-up with her rheumatologist   ____________________________________________   FINAL CLINICAL IMPRESSION(S) / ED DIAGNOSES   Final diagnoses:  Rheumatoid arthritis involving right hand, unspecified rheumatoid factor presence ([place])      MEDICATIONS GIVEN DURING THIS VISIT:  Medications - No data to display   ED Discharge Orders         Ordered    predniSONE (DELTASONE) 10 MG tablet  Daily     03/29/19 2049    diclofenac sodium (VOLTAREN) 1 % GEL  4 times daily     03/29/19 2050           Note:  This document was prepared using Dragon voice recognition software and may include unintentional dictation errors.   Vanessa [place], MD 03/29/19 2053

## 2019-03-29 NOTE — ED Triage Notes (Addendum)
Pt comes via POV with complaint of bilateral leg swelling for about a month. Pt also c/o right pain and redness to thumb. Pt states she takes lasix. Pt was here last month for sepsis and afib. Pt is tachycardic on arrival. Pt has a history of recent sepsis and RA. Pt thinks that RA is flaring up.   Pt denies cp, sob, and fevers at this time.

## 2019-03-29 NOTE — ED Notes (Signed)
Spoke to Dr. Charna Archer for order set, MD requested 2 sets of blood cultures and labs, but not to put in a specific order set at this time.

## 2019-03-29 NOTE — Telephone Encounter (Signed)
Incoming call from Patient statin that  she  Was in the hospital for 6 days and was treated for Sepsis. Patient state she woke up week feet swollen and thumb swollen .Warm to touch.  Denies fever.  Was taking Antibiotics for about 9 days. Transferred to office.         Reason for Disposition . [1] Taking antibiotics > 24 hours AND [2] symptoms WORSE  Answer Assessment - Initial Assessment Questions 1. INFECTION: "What infection is the antibiotic being given for?"    Left knee 2. ANTIBIOTIC: "What antibiotic are you taking" "How many times per day?"     Lefofloxacin 500mg  3. DURATION: "When was the antibiotic started?"     9 to days 4. MAIN CONCERN OR SYMPTOM:  "What is your main concern right now?"     Thumb is swollen and red  Warm to touch 5. BETTER-SAME-WORSE: "Are you getting better, staying the same, or getting worse compared to when you first started the antibiotics?" If getting worse, ask: "In what way?"      worse 6. FEVER: "Do you have a fever?" If so, ask: "What is your temperature, how was it measured, and when did it start?"     denies 7. SYMPTOMS: "Are there any other symptoms you're concerned about?" If so, ask: "When did it start?"     Feet.  8. FOLLOW-UP APPOINTMENT: "Do you have a follow-up appointment with your doctor?"     Had one  tweeks away with heart  Protocols used: INFECTION ON ANTIBIOTIC FOLLOW-UP CALL-A-AH

## 2019-03-29 NOTE — Telephone Encounter (Signed)
Pt with increased swelling, redness in thumb, not feeling well, etc.  Recently admitted with sepsis - infected thumb.  Per note agreed to ER evaluation.  Please confirm - evaluated.

## 2019-03-29 NOTE — Telephone Encounter (Signed)
Pt at ed

## 2019-03-29 NOTE — Discharge Instructions (Signed)
We think that the swelling is most likely secondary to your rheumatoid arthritis.  Hold your prednisone and take my prednisone as below. Take 4 pills for 2 days.  2 pills for 2 days.  And 1 pill for 2 days.  Then go back to your normal 5mg  prednisone. You can use the gel to rub on the joint. Call your rheumatologist on Monday to get follow-up.  For the swelling increase your Lasix to twice daily for 3 days to see if that helps improve it.  Otherwise follow with your primary care doctor.  Return to the ER for fevers, shortness of breath, increasing redness up the arm or any other concerns.

## 2019-04-03 ENCOUNTER — Inpatient Hospital Stay
Admission: EM | Admit: 2019-04-03 | Discharge: 2019-04-05 | DRG: 309 | Disposition: A | Discharge status: Home / Self Care | Payer: Medicare Other | Attending: Internal Medicine | Admitting: Internal Medicine

## 2019-04-03 ENCOUNTER — Emergency Department: Payer: Medicare Other

## 2019-04-03 ENCOUNTER — Other Ambulatory Visit: Payer: Self-pay

## 2019-04-03 ENCOUNTER — Encounter: Payer: Self-pay | Admitting: Emergency Medicine

## 2019-04-03 DIAGNOSIS — Z20828 Contact with and (suspected) exposure to other viral communicable diseases: Secondary | ICD-10-CM | POA: Diagnosis present

## 2019-04-03 DIAGNOSIS — E785 Hyperlipidemia, unspecified: Secondary | ICD-10-CM | POA: Diagnosis present

## 2019-04-03 DIAGNOSIS — M810 Age-related osteoporosis without current pathological fracture: Secondary | ICD-10-CM | POA: Diagnosis present

## 2019-04-03 DIAGNOSIS — I351 Nonrheumatic aortic (valve) insufficiency: Secondary | ICD-10-CM | POA: Diagnosis present

## 2019-04-03 DIAGNOSIS — E876 Hypokalemia: Secondary | ICD-10-CM | POA: Diagnosis present

## 2019-04-03 DIAGNOSIS — Z881 Allergy status to other antibiotic agents status: Secondary | ICD-10-CM | POA: Diagnosis not present

## 2019-04-03 DIAGNOSIS — F419 Anxiety disorder, unspecified: Secondary | ICD-10-CM | POA: Diagnosis present

## 2019-04-03 DIAGNOSIS — K219 Gastro-esophageal reflux disease without esophagitis: Secondary | ICD-10-CM | POA: Diagnosis present

## 2019-04-03 DIAGNOSIS — Z880 Allergy status to penicillin: Secondary | ICD-10-CM

## 2019-04-03 DIAGNOSIS — Z801 Family history of malignant neoplasm of trachea, bronchus and lung: Secondary | ICD-10-CM

## 2019-04-03 DIAGNOSIS — Z7952 Long term (current) use of systemic steroids: Secondary | ICD-10-CM

## 2019-04-03 DIAGNOSIS — Z79899 Other long term (current) drug therapy: Secondary | ICD-10-CM

## 2019-04-03 DIAGNOSIS — I10 Essential (primary) hypertension: Secondary | ICD-10-CM | POA: Diagnosis present

## 2019-04-03 DIAGNOSIS — Z7983 Long term (current) use of bisphosphonates: Secondary | ICD-10-CM | POA: Diagnosis not present

## 2019-04-03 DIAGNOSIS — I248 Other forms of acute ischemic heart disease: Secondary | ICD-10-CM | POA: Diagnosis present

## 2019-04-03 DIAGNOSIS — M069 Rheumatoid arthritis, unspecified: Secondary | ICD-10-CM | POA: Diagnosis present

## 2019-04-03 DIAGNOSIS — Z8249 Family history of ischemic heart disease and other diseases of the circulatory system: Secondary | ICD-10-CM

## 2019-04-03 DIAGNOSIS — I081 Rheumatic disorders of both mitral and tricuspid valves: Secondary | ICD-10-CM | POA: Diagnosis not present

## 2019-04-03 DIAGNOSIS — Z882 Allergy status to sulfonamides status: Secondary | ICD-10-CM

## 2019-04-03 DIAGNOSIS — M1990 Unspecified osteoarthritis, unspecified site: Secondary | ICD-10-CM | POA: Diagnosis present

## 2019-04-03 DIAGNOSIS — M25462 Effusion, left knee: Secondary | ICD-10-CM | POA: Diagnosis not present

## 2019-04-03 DIAGNOSIS — Z7984 Long term (current) use of oral hypoglycemic drugs: Secondary | ICD-10-CM | POA: Diagnosis not present

## 2019-04-03 DIAGNOSIS — R531 Weakness: Secondary | ICD-10-CM | POA: Diagnosis not present

## 2019-04-03 DIAGNOSIS — D638 Anemia in other chronic diseases classified elsewhere: Secondary | ICD-10-CM | POA: Diagnosis present

## 2019-04-03 DIAGNOSIS — R7989 Other specified abnormal findings of blood chemistry: Secondary | ICD-10-CM | POA: Diagnosis not present

## 2019-04-03 DIAGNOSIS — Z23 Encounter for immunization: Secondary | ICD-10-CM

## 2019-04-03 DIAGNOSIS — Z803 Family history of malignant neoplasm of breast: Secondary | ICD-10-CM

## 2019-04-03 DIAGNOSIS — I4891 Unspecified atrial fibrillation: Secondary | ICD-10-CM | POA: Diagnosis not present

## 2019-04-03 DIAGNOSIS — D649 Anemia, unspecified: Secondary | ICD-10-CM | POA: Diagnosis not present

## 2019-04-03 DIAGNOSIS — Z7982 Long term (current) use of aspirin: Secondary | ICD-10-CM | POA: Diagnosis not present

## 2019-04-03 DIAGNOSIS — I480 Paroxysmal atrial fibrillation: Secondary | ICD-10-CM | POA: Diagnosis not present

## 2019-04-03 DIAGNOSIS — Z825 Family history of asthma and other chronic lower respiratory diseases: Secondary | ICD-10-CM

## 2019-04-03 DIAGNOSIS — I4819 Other persistent atrial fibrillation: Secondary | ICD-10-CM | POA: Diagnosis present

## 2019-04-03 DIAGNOSIS — Z7901 Long term (current) use of anticoagulants: Secondary | ICD-10-CM

## 2019-04-03 LAB — BASIC METABOLIC PANEL
Anion gap: 14 (ref 5–15)
BUN: 14 mg/dL (ref 8–23)
CO2: 25 mmol/L (ref 22–32)
Calcium: 9 mg/dL (ref 8.9–10.3)
Chloride: 94 mmol/L — ABNORMAL LOW (ref 98–111)
Creatinine, Ser: 0.89 mg/dL (ref 0.44–1.00)
GFR calc Af Amer: >60 mL/min (ref >60)
GFR calc non Af Amer: >60 mL/min (ref >60)
Glucose, Bld: 106 mg/dL — ABNORMAL HIGH (ref 70–99)
Potassium: 2.9 mmol/L — ABNORMAL LOW (ref 3.5–5.1)
Sodium: 133 mmol/L — ABNORMAL LOW (ref 135–145)

## 2019-04-03 LAB — TROPONIN I (HIGH SENSITIVITY)
Troponin I (High Sensitivity): 73 ng/L — ABNORMAL HIGH (ref <18)
Troponin I (High Sensitivity): 85 ng/L — ABNORMAL HIGH (ref <18)
Troponin I (High Sensitivity): 91 ng/L — ABNORMAL HIGH (ref <18)
Troponin I (High Sensitivity): 88 ng/L — ABNORMAL HIGH (ref <18)

## 2019-04-03 LAB — PROTIME-INR
INR: 1.4 — ABNORMAL HIGH (ref 0.8–1.2)
Prothrombin Time: 17.4 seconds — ABNORMAL HIGH (ref 11.4–15.2)

## 2019-04-03 LAB — CULTURE, BLOOD (SINGLE)
Culture: NO GROWTH
Special Requests: ADEQUATE

## 2019-04-03 LAB — CBC
HCT: 33.8 % — ABNORMAL LOW (ref 36.0–46.0)
Hemoglobin: 11.4 g/dL — ABNORMAL LOW (ref 12.0–15.0)
MCH: 30.2 pg (ref 26.0–34.0)
MCHC: 33.7 g/dL (ref 30.0–36.0)
MCV: 89.7 fL (ref 80.0–100.0)
Platelets: 292 10*3/uL (ref 150–400)
RBC: 3.77 MIL/uL — ABNORMAL LOW (ref 3.87–5.11)
RDW: 12.4 % (ref 11.5–15.5)
WBC: 11.2 10*3/uL — ABNORMAL HIGH (ref 4.0–10.5)
nRBC: 0 % (ref 0.0–0.2)

## 2019-04-03 LAB — MAGNESIUM: Magnesium: 1.9 mg/dL (ref 1.7–2.4)

## 2019-04-03 MED ORDER — DOCUSATE SODIUM 100 MG PO CAPS: 100.0000 mg | ORAL_CAPSULE | Freq: Two times a day (BID) | ORAL | Status: DC | PRN

## 2019-04-03 MED ORDER — INFLUENZA VAC A&B SA ADJ QUAD 0.5 ML IM PRSY
0.5000 mL | PREFILLED_SYRINGE | INTRAMUSCULAR | Status: AC
  Administered 2019-04-05: 0.5 mL via INTRAMUSCULAR
  Filled 2019-04-03: qty 0.5

## 2019-04-03 MED ORDER — FLUTICASONE PROPIONATE 50 MCG/ACT NA SUSP
2.0000 | Freq: Every day | NASAL | Status: DC
  Administered 2019-04-03 – 2019-04-05 (×3): 2 via NASAL
  Filled 2019-04-03: qty 16

## 2019-04-03 MED ORDER — PREDNISONE 10 MG PO TABS
5.0000 mg | ORAL_TABLET | Freq: Every day | ORAL | Status: DC
  Administered 2019-04-04 – 2019-04-05 (×2): 5 mg via ORAL
  Filled 2019-04-03 (×2): qty 1

## 2019-04-03 MED ORDER — ALPRAZOLAM 0.25 MG PO TABS
0.2500 mg | ORAL_TABLET | Freq: Three times a day (TID) | ORAL | Status: DC | PRN
  Administered 2019-04-03 – 2019-04-05 (×4): 0.25 mg via ORAL
  Filled 2019-04-03 (×4): qty 1

## 2019-04-03 MED ORDER — ADULT MULTIVITAMIN W/MINERALS CH
1.0000 | ORAL_TABLET | Freq: Every day | ORAL | Status: DC
  Administered 2019-04-03 – 2019-04-05 (×3): 1 via ORAL
  Filled 2019-04-03 (×3): qty 1

## 2019-04-03 MED ORDER — APIXABAN 5 MG PO TABS
5.0000 mg | ORAL_TABLET | Freq: Two times a day (BID) | ORAL | Status: DC
  Administered 2019-04-03 – 2019-04-05 (×4): 5 mg via ORAL
  Filled 2019-04-03 (×4): qty 1

## 2019-04-03 MED ORDER — MECLIZINE HCL 12.5 MG PO TABS
12.5000 mg | ORAL_TABLET | Freq: Three times a day (TID) | ORAL | Status: DC | PRN
  Filled 2019-04-03: qty 2

## 2019-04-03 MED ORDER — SIMVASTATIN 20 MG PO TABS
20.0000 mg | ORAL_TABLET | Freq: Every day | ORAL | Status: DC
  Administered 2019-04-03 – 2019-04-05 (×3): 20 mg via ORAL
  Filled 2019-04-03 (×3): qty 1

## 2019-04-03 MED ORDER — LORATADINE 10 MG PO TABS
10.0000 mg | ORAL_TABLET | Freq: Every day | ORAL | Status: DC
  Administered 2019-04-03 – 2019-04-05 (×3): 10 mg via ORAL
  Filled 2019-04-03 (×3): qty 1

## 2019-04-03 MED ORDER — ALBUTEROL SULFATE (2.5 MG/3ML) 0.083% IN NEBU: 2.5000 mg | INHALATION_SOLUTION | Freq: Four times a day (QID) | RESPIRATORY_TRACT | Status: DC | PRN

## 2019-04-03 MED ORDER — CALCIUM CARBONATE-VITAMIN D 500-200 MG-UNIT PO TABS
1.0000 | ORAL_TABLET | Freq: Two times a day (BID) | ORAL | Status: DC
  Administered 2019-04-03 – 2019-04-05 (×4): 1 via ORAL
  Filled 2019-04-03 (×4): qty 1

## 2019-04-03 MED ORDER — VITAMIN D 25 MCG (1000 UNIT) PO TABS
1000.0000 [IU] | ORAL_TABLET | Freq: Every day | ORAL | Status: DC
  Administered 2019-04-03 – 2019-04-05 (×3): 1000 [IU] via ORAL
  Filled 2019-04-03 (×3): qty 1

## 2019-04-03 MED ORDER — DILTIAZEM HCL 100 MG IV SOLR
5.0000 mg/h | INTRAVENOUS | Status: DC
  Administered 2019-04-03: 5 mg/h via INTRAVENOUS
  Administered 2019-04-04: 10 mg/h via INTRAVENOUS
  Administered 2019-04-04: 7.5 mg/h via INTRAVENOUS
  Filled 2019-04-03 (×2): qty 100

## 2019-04-03 MED ORDER — PANTOPRAZOLE SODIUM 40 MG PO TBEC
40.0000 mg | DELAYED_RELEASE_TABLET | Freq: Every day | ORAL | Status: DC
  Administered 2019-04-03 – 2019-04-05 (×3): 40 mg via ORAL
  Filled 2019-04-03 (×3): qty 1

## 2019-04-03 MED ORDER — COLESTIPOL HCL 1 G PO TABS
2.0000 g | ORAL_TABLET | Freq: Every day | ORAL | Status: DC
  Administered 2019-04-04 – 2019-04-05 (×2): 2 g via ORAL
  Filled 2019-04-03 (×2): qty 2

## 2019-04-03 MED ORDER — POTASSIUM CHLORIDE CRYS ER 20 MEQ PO TBCR
20.0000 meq | EXTENDED_RELEASE_TABLET | Freq: Two times a day (BID) | ORAL | Status: AC
  Administered 2019-04-03 – 2019-04-04 (×2): 20 meq via ORAL
  Filled 2019-04-03 (×2): qty 1

## 2019-04-03 MED ORDER — POTASSIUM CHLORIDE CRYS ER 20 MEQ PO TBCR
40.0000 meq | EXTENDED_RELEASE_TABLET | Freq: Once | ORAL | Status: AC
  Administered 2019-04-03: 40 meq via ORAL
  Filled 2019-04-03: qty 2

## 2019-04-03 NOTE — ED Triage Notes (Addendum)
Patient reports that she went into afib at approximately 230 this morning. States she can feel fluttering in her chest but denies CP or SOB.   Patient states she feels anxious. States this is how she normally feels when she is in afib and that she also has history of anxiety unrelated to afib.

## 2019-04-03 NOTE — ED Provider Notes (Signed)
[facility] Emergency Department Provider Note  ____________________________________________  Time seen: Approximately 2:19 PM  I have reviewed the triage vital signs and the nursing notes.   HISTORY  Chief Complaint Atrial Fibrillation    HPI Kaitlyn Huff is a 78 y.o. female with a history of cancer GERD hypertension paroxysmal atrial fibrillation and rheumatoid arthritis who comes the ED complaining of  feeling anxious and generalized weakness.  States that her chest is been fluttering and palpitating on and off for the last several days.  She came to the ED 5 days ago for similar symptoms, was rate controlled, serial troponins seem to be stable.  However, she is not improving and has noticed that her heart rate is becoming increasingly tachycardic despite taking her extended release diltiazem and extra supplemental doses of diltiazem.  She sees cardiology Dr. Ubaldo Glassing.  She denies chest pain or shortness of breath.     Past Medical History:  Diagnosis Date  . Anemia   . Cancer ([place])   . Diverticulitis   . GERD (gastroesophageal reflux disease)   . Hyperlipidemia   . Hypertension   . Osteoarthritis   . Osteoporosis    actonel  . Positive PPD    s/p INH (2006)  . Rheumatoid arthritis(714.0)    MTX transaminitis, Leflunomide (rash), enbrel, plaquinil, prednisone, remicade, Imuran (transaminitis)  . Valvular heart disease    moderate MR and TR     Patient Active Problem List   Diagnosis Date Noted  . Atrial fibrillation with rapid ventricular response ([place]) 02/27/2019  . DJD (degenerative joint disease), lumbosacral 04/02/2018  . Nodule of groin 04/01/2018  . Fatty liver 03/29/2018  . Anxiety 01/19/2018  . Difficulty sleeping 01/19/2018  . Acute urticaria 12/21/2017  . Colitis 06/24/2017  . Dizziness 10/01/2016  . A-fib ([place]) 07/12/2016  . 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 Chronic venous insufficiency  04/25/2016  . Pain in limb 04/25/2016  . Swelling of limb 04/25/2016  . Tachycardia 03/22/2016  . Foot fracture 03/18/2016  . Cough 01/27/2016  . Hypoxia 12/20/2015  . Lower extremity edema 12/18/2015  . Diarrhea 09/15/2015  . Restless leg syndrome 08/07/2015  . History of colonic polyps 07/10/2015  . Health care maintenance 01/11/2015  . Weight loss 08/04/2013  . Obstructive sleep apnea 12/23/2012  . Dilated bile duct 12/23/2012  . Hypercholesterolemia 08/12/2012  . Rheumatoid arthritis ([place]) 08/12/2012  . GERD (gastroesophageal reflux disease) 08/12/2012  . Hypertension 08/12/2012  . Anemia 08/12/2012     Past Surgical History:  Procedure Laterality Date  . ABDOMINAL HYSTERECTOMY    . CERVICAL CONE BIOPSY    . CHOLECYSTECTOMY  06/22/14  . COLONOSCOPY WITH PROPOFOL N/A 07/09/2015   Procedure: COLONOSCOPY WITH PROPOFOL;  Surgeon: Manya Silvas, MD;  Location: [facility] ENDOSCOPY;  Service: Endoscopy;  Laterality: N/A;  . ESOPHAGOGASTRODUODENOSCOPY (EGD) WITH PROPOFOL N/A 07/09/2015   Procedure: ESOPHAGOGASTRODUODENOSCOPY (EGD) WITH PROPOFOL;  Surgeon: Manya Silvas, MD;  Location: [facility] ENDOSCOPY;  Service: Endoscopy;  Laterality: N/A;  . OVARY SURGERY    . TRACHEOSTOMY  1959  . TUBAL LIGATION       Prior to Admission medications   Medication Sig Start Date End Date Taking? Authorizing Provider  acetaminophen (TYLENOL) 325 MG tablet Take 2 tablets (650 mg total) by mouth every 6 (six) hours as needed for mild pain (or Fever >/= 101). 06/26/17   Gouru, Illene Silver, MD  acidophilus (RISAQUAD) CAPS capsule Take 1 capsule by mouth daily.  [provider]  albuterol (PROAIR HFA) 108 (90 Base) MCG/ACT inhaler USE 2 INHALATIONS EVERY 6 HOURS AS NEEDED FOR WHEEZING OR SHORTNESS OF BREATH 06/14/18   [name], MD  alendronate (FOSAMAX) 70 MG tablet Take 70 mg by mouth once a week.  09/04/15   [provider]  ALPRAZolam [name]) 0.25 MG tablet Take 1 tablet (0.25  mg total) by mouth daily as needed for anxiety. 02/20/19   [name], MD  apixaban (ELIQUIS) 5 MG TABS tablet Take 5 mg by mouth 2 (two) times daily.    [provider]  busPIRone (BUSPAR) 5 MG tablet Take 1 tablet (5 mg total) by mouth at bedtime as needed. Patient not taking: Reported on 02/27/2019 07/12/18   [name], MD  Calcium Carbonate-Vitamin D (CALCIUM 600+D) 600-400 MG-UNIT tablet Take 1 tablet by mouth 2 (two) times daily.     [provider]  cetirizine (ZYRTEC) 10 MG tablet TAKE 1 TABLET DAILY Patient taking differently: Take 10 mg by mouth at bedtime.  04/10/18   [name], MD  cholecalciferol (VITAMIN D) 1000 units tablet Take 1,000 Units by mouth daily.    [provider]  colestipol (COLESTID) 1 g tablet Take 2 g by mouth daily.     [provider]  diclofenac sodium (VOLTAREN) 1 % GEL Apply 4 g topically 4 (four) times daily for 7 days. 03/29/19 04/05/19  Vanessa Glenwood, MD  diltiazem (CARDIZEM CD) 240 MG 24 hr capsule Take 1 capsule (240 mg total) by mouth daily. 03/01/19   Epifanio Lesches, MD  diltiazem (CARDIZEM) 30 MG tablet Take 30 mg by mouth 4 (four) times daily. 03/20/19 03/19/20  [provider]  esomeprazole (NEXIUM) 20 MG capsule Take 1 capsule (20 mg total) by mouth daily at 12 noon. 09/06/18   [name], MD  FeFum-FePo-FA-B Cmp-C-Zn-Mn-Cu (TANDEM PLUS) 162-115.2-1 MG CAPS Take 1 capsule by mouth daily. Patient not taking: Reported on 02/27/2019 05/21/18   [name], MD  fluticasone [facility]) 50 MCG/ACT nasal spray Place 2 sprays into both nostrils daily. 02/19/19   [name], MD  furosemide (LASIX) 20 MG tablet Take 20 mg by mouth daily.    [provider]  guaiFENesin-dextromethorphan (ROBITUSSIN DM) 100-10 MG/5ML syrup Take 5 mLs by mouth every 4 (four) hours as needed for cough. Patient not taking: Reported on 08/03/2018 08/07/15   Theodoro Grist, MD  lip balm (BLISTEX) OINT Apply 1  application topically as needed for lip care. 06/21/16   Loletha Grayer, MD  meclizine (ANTIVERT) 25 MG tablet take 1/2 to 1 tablet every 8 hours if needed for VERTIGO 11/17/17   [name], MD  Multiple Vitamin (MULTIVITAMIN WITH MINERALS) TABS tablet Take 1 tablet by mouth daily.    [provider]  ondansetron (ZOFRAN ODT) 4 MG disintegrating tablet Take 1 tablet (4 mg total) by mouth every 8 (eight) hours as needed for nausea or vomiting. 11/19/16   Lavonia Drafts, MD  potassium chloride (MICRO-K) 10 MEQ CR capsule TAKE 2 CAPSULES BY MOUTH EVERY DAY 11/22/18   [name], MD  predniSONE (DELTASONE) 10 MG tablet Take 1 tablet (10 mg total) by mouth daily for 6 days. Take 4 pills for 2 days.  2 pills for 2 days.  And 1 pill for 2 days.  Then go back to your normal 5mg  prednisone. 03/29/19 04/04/19  Vanessa [place], MD  predniSONE (DELTASONE) 5 MG tablet Take 1 tablet (5 mg total) by mouth daily with breakfast. Patient  taking differently: Take 2.5 mg by mouth daily with breakfast.  12/23/16   Bettey Costa, MD  Probiotic Product (ALIGN PO) Take 1 tablet by mouth daily.    [provider]  simvastatin (ZOCOR) 20 MG tablet TAKE 1 TABLET DAILY 07/18/18   [name], MD  sodium chloride (OCEAN) 0.65 % nasal spray Place 1 spray into the nose as needed for congestion.    [provider]  triamcinolone cream (KENALOG) 0.1 % Apply 1 application topically 2 (two) times daily. Prn 12/21/17   McLean-Scocuzza, Nino Glow, MD     Allergies Tramadol, Astelin [azelastine hcl], Codeine, Dilaudid [hydromorphone], Flexeril [cyclobenzaprine], Imuran [azathioprine], Lisinopril, Lyrica [pregabalin], Methotrexate derivatives, Amiodarone, Amoxicillin, Arava [leflunomide], Clindamycin/lincomycin, Doxycycline, Lodine [etodolac], Percocet [oxycodone-acetaminophen], and Sulfa antibiotics   Family History  Problem Relation Age of Onset  . Heart disease Father        MI  . Heart disease Mother    . Valvular heart disease Mother   . Breast cancer Sister 98  . Cancer Sister        Lung cancer  . COPD Sister   . Heart disease Sister   . Colon cancer Neg Hx     Social History Social History   Tobacco Use  . Smoking status: Never Smoker  . Smokeless tobacco: Never Used  Substance Use Topics  . Alcohol use: No    Alcohol/week: 0.0 standard drinks  . Drug use: No    Review of Systems  Constitutional:   No fever or chills.  ENT:   No sore throat. No rhinorrhea. Cardiovascular:   No chest pain or syncope.  Positive palpitations Respiratory:   No dyspnea or cough. Gastrointestinal:   Negative for abdominal pain, vomiting and diarrhea.  Musculoskeletal:   Negative for focal pain or swelling All other systems reviewed and are negative except as documented above in ROS and HPI.  ____________________________________________   PHYSICAL EXAM:  VITAL SIGNS: ED Triage Vitals  Enc Vitals Group     BP 04/03/19 1230 (!) 158/81     Pulse Rate 04/03/19 1412 (!) 113     Resp 04/03/19 1230 16     Temp 04/03/19 1230 98.1 F (36.7 C)     Temp Source 04/03/19 1230 Oral     SpO2 04/03/19 1230 100 %     Weight 04/03/19 1231 130 lb (59 kg)     Height 04/03/19 1231 5\' 5"  (1.651 m)     Head Circumference --      Peak Flow --      Pain Score 04/03/19 1231 0     Pain Loc --      Pain Edu? --      Excl. in [place]? --     Vital signs reviewed, nursing assessments reviewed.   Constitutional:   Alert and oriented. Non-toxic appearance. Eyes:   Conjunctivae are normal. EOMI. PERRL. ENT      Head:   Normocephalic and atraumatic.      Nose:   Wearing a mask.      Mouth/Throat:   Wearing a mask.      Neck:   No meningismus. Full ROM. Hematological/Lymphatic/Immunilogical:   No cervical lymphadenopathy. Cardiovascular:   Irregularly irregular rhythm, heart rate 1 20-1 30. Symmetric bilateral radial and DP pulses.  No murmurs. Cap refill less than 2 seconds. Respiratory:   Normal  respiratory effort without tachypnea/retractions. Breath sounds are clear and equal bilaterally. No wheezes/rales/rhonchi. Gastrointestinal:   Soft and nontender. Non distended. There  is no CVA tenderness.  No rebound, rigidity, or guarding.  Musculoskeletal:   Normal range of motion in all extremities. No joint effusions.  No lower extremity tenderness.  No edema. Neurologic:   Normal speech and language.  Motor grossly intact. No acute focal neurologic deficits are appreciated.  Skin:    Skin is warm, dry and intact. No rash noted.  No petechiae, purpura, or bullae.  ____________________________________________    LABS (pertinent positives/negatives) (all labs ordered are listed, but only abnormal results are displayed) Labs Reviewed  BASIC METABOLIC PANEL - Abnormal; Notable for the following components:      Result Value   Sodium 133 (*)    Potassium 2.9 (*)    Chloride 94 (*)    Glucose, Bld 106 (*)    All other components within normal limits  CBC - Abnormal; Notable for the following components:   WBC 11.2 (*)    RBC 3.77 (*)    Hemoglobin 11.4 (*)    HCT 33.8 (*)    All other components within normal limits  PROTIME-INR - Abnormal; Notable for the following components:   Prothrombin Time 17.4 (*)    INR 1.4 (*)    All other components within normal limits  TROPONIN I (HIGH SENSITIVITY) - Abnormal; Notable for the following components:   Troponin I (High Sensitivity) 73 (*)    All other components within normal limits  SARS CORONAVIRUS 2 (TAT 6-24 HRS)  MAGNESIUM  TROPONIN I (HIGH SENSITIVITY)   ____________________________________________   EKG  Interpreted by me Atrial fibrillation with RVR, rate of 116, normal axis intervals QRS ST segments and T waves.  ____________________________________________    RADIOLOGY  Dg Chest 2 View  Result Date: 04/03/2019 CLINICAL DATA:  Atrial fibrillation. EXAM: CHEST - 2 VIEW COMPARISON:  Chest x-ray 02/27/2019  FINDINGS: The cardiac silhouette, mediastinal and hilar contours are within normal limits and stable. The lungs are clear. Mild eventration of the right hemidiaphragm is stable. No worrisome pulmonary lesions. No pleural effusions. The bony thorax is intact. IMPRESSION: No acute cardiopulmonary findings. Electronically Signed   By: Marijo Sanes M.D.   On: 04/03/2019 13:16    ____________________________________________   PROCEDURES .Critical Care Performed by: Carrie Mew, MD Authorized by: Carrie Mew, MD   Critical care provider statement:    Critical care time (minutes):  32   Critical care time was exclusive of:  Separately billable procedures and treating other patients   Critical care was necessary to treat or prevent imminent or life-threatening deterioration of the following conditions:  Circulatory failure and cardiac failure   Critical care was time spent personally by me on the following activities:  Development of treatment plan with patient or surrogate, discussions with consultants, evaluation of patient's response to treatment, examination of patient, obtaining history from patient or surrogate, ordering and performing treatments and interventions, ordering and review of laboratory studies, ordering and review of radiographic studies, pulse oximetry, re-evaluation of patient's condition and review of old charts    ____________________________________________    CLINICAL IMPRESSION / ASSESSMENT AND PLAN / ED COURSE  Medications ordered in the ED: Medications  diltiazem (CARDIZEM) 100 mg in dextrose 5 % 100 mL (1 mg/mL) infusion (has no administration in time range)  potassium chloride SA (K-DUR) CR tablet 40 mEq (40 mEq Oral Given 04/03/19 1407)    Pertinent labs & imaging results that were available during my care of the patient were reviewed by me and considered in my medical decision making (  see chart for details).  JENAFER DONNELLY was evaluated in  Emergency Department on 04/03/2019 for the symptoms described in the history of present illness. She was evaluated in the context of the global COVID-19 pandemic, which necessitated consideration that the patient might be at risk for infection with the SARS-CoV-2 virus that causes COVID-19. Institutional protocols and algorithms that pertain to the evaluation of patients at risk for COVID-19 are in a state of rapid change based on information released by regulatory bodies including the CDC and federal and state organizations. These policies and algorithms were followed during the patient's care in the ED.   Patient presents with palpitations, found to be in A. fib with RVR.  Blood pressure is stable.  Troponin appears to be at chronic baseline, she is hypokalemic with a potassium of 2.9.  Given failure of outpatient treatment, will start her on a diltiazem drip, discussed with hospitalist for further management.  Oral potassium replacement.  Clinical Course as of Apr 02 1418  Wed Apr 03, 2019  1341 Calcium: 9.0 [PS]    Clinical Course User Index [PS] Carrie Mew, MD     ____________________________________________   FINAL CLINICAL IMPRESSION(S) / ED DIAGNOSES    Final diagnoses:  Generalized weakness  Atrial fibrillation with rapid ventricular response [facility])     ED Discharge Orders    None      Portions of this note were generated with dragon dictation software. Dictation errors may occur despite best attempts at proofreading.   Carrie Mew, MD 04/03/19 1438

## 2019-04-04 LAB — BASIC METABOLIC PANEL
Anion gap: 9 (ref 5–15)
BUN: 16 mg/dL (ref 8–23)
CO2: 26 mmol/L (ref 22–32)
Calcium: 8.6 mg/dL — ABNORMAL LOW (ref 8.9–10.3)
Chloride: 102 mmol/L (ref 98–111)
Creatinine, Ser: 0.81 mg/dL (ref 0.44–1.00)
GFR calc Af Amer: >60 mL/min (ref >60)
GFR calc non Af Amer: >60 mL/min (ref >60)
Glucose, Bld: 109 mg/dL — ABNORMAL HIGH (ref 70–99)
Potassium: 3.8 mmol/L (ref 3.5–5.1)
Sodium: 137 mmol/L (ref 135–145)

## 2019-04-04 LAB — CBC
HCT: 31.8 % — ABNORMAL LOW (ref 36.0–46.0)
Hemoglobin: 10.6 g/dL — ABNORMAL LOW (ref 12.0–15.0)
MCH: 30 pg (ref 26.0–34.0)
MCHC: 33.3 g/dL (ref 30.0–36.0)
MCV: 90.1 fL (ref 80.0–100.0)
Platelets: 274 10*3/uL (ref 150–400)
RBC: 3.53 MIL/uL — ABNORMAL LOW (ref 3.87–5.11)
RDW: 12.6 % (ref 11.5–15.5)
WBC: 8.8 10*3/uL (ref 4.0–10.5)
nRBC: 0 % (ref 0.0–0.2)

## 2019-04-04 LAB — SARS CORONAVIRUS 2 (TAT 6-24 HRS): SARS Coronavirus 2: NEGATIVE

## 2019-04-04 LAB — MAGNESIUM: Magnesium: 2 mg/dL (ref 1.7–2.4)

## 2019-04-04 MED ORDER — DILTIAZEM HCL ER COATED BEADS 240 MG PO CP24
240.0000 mg | ORAL_CAPSULE | Freq: Every day | ORAL | Status: DC
  Administered 2019-04-04 – 2019-04-05 (×2): 240 mg via ORAL
  Filled 2019-04-04: qty 2
  Filled 2019-04-04 (×2): qty 1

## 2019-04-04 MED ORDER — OFF THE BEAT BOOK
Freq: Once | Status: AC
  Administered 2019-04-04: 07:00:00
  Filled 2019-04-04: qty 1

## 2019-04-04 MED ORDER — SODIUM CHLORIDE 0.9 % IV SOLN
INTRAVENOUS | Status: DC | PRN
  Administered 2019-04-04: 250 mL via INTRAVENOUS

## 2019-04-04 MED ORDER — METOPROLOL SUCCINATE ER 25 MG PO TB24
25.0000 mg | ORAL_TABLET | Freq: Every day | ORAL | Status: DC
  Administered 2019-04-04 – 2019-04-05 (×2): 25 mg via ORAL
  Filled 2019-04-04 (×2): qty 1

## 2019-04-04 NOTE — Consult Note (Addendum)
[facility] Cardiology  CARDIOLOGY CONSULT NOTE  Patient ID: Kaitlyn Huff MRN: [id number] DOB/AGE: Sep 30, 1940 78 y.o.  Admit date: 04/03/2019 Referring Physician Dr. Anselm Jungling Primary Physician Dr. Nicki Reaper Primary Cardiologist Dr. Ubaldo Glassing Reason for Consultation A-fib with RVR   HPI:  Kaitlyn Huff is a 78 y.o. with a past medical history of persistent atrial fibrillation, anticoagulated on Eliquis, rheumatoid arthritis currently on steroid taper, HTN, HLD, and moderate aortic insufficiency, who presented to the ED yesterday at the recommendation of her home health nurse due to rapid heart rate. The patient has struggled with heart rate control in the setting of A-fib for several weeks. She has intermittent episodes of tachycardia that last between 10 minutes and several hours. Two nights ago she reports being up for the majority of the night with a heart rate of 120-140 BPM. Aside from feelings of anxiety, she reports not having any other symptoms associated with the rapid heart rate. She denies lightheadedness, syncope, chest pain, shortness of breath.   Current rate control regimen included diltiazem XR 240 mg daily. She also takes short acting diltiazem 30 mg PRN for rates above 100 BPM. She reports taking two extra doses of her short acting diltiazem two nights ago but still having a rapid heart rate despite this.   In the ER her heart rate was in the 110s. She was started on a diltiazem drip. Her troponin was initially elevated to 73 > 85 > 91 > 88. EKG without evidence of ischemia.   Review of systems complete and found to be negative unless listed above    Past Medical History:  Diagnosis Date  . Anemia   . Cancer ([place])   . Diverticulitis   . GERD (gastroesophageal reflux disease)   . Hyperlipidemia   . Hypertension   . Osteoarthritis   . Osteoporosis    actonel  . Positive PPD    s/p INH (2006)  . Rheumatoid arthritis(714.0)    MTX transaminitis, Leflunomide (rash), enbrel,  plaquinil, prednisone, remicade, Imuran (transaminitis)  . Valvular heart disease    moderate MR and TR    Past Surgical History:  Procedure Laterality Date  . ABDOMINAL HYSTERECTOMY    . CERVICAL CONE BIOPSY    . CHOLECYSTECTOMY  06/22/14  . COLONOSCOPY WITH PROPOFOL N/A 07/09/2015   Procedure: COLONOSCOPY WITH PROPOFOL;  Surgeon: Manya Silvas, MD;  Location[facility] END[facility]COPY;  Service: Endoscopy;  Laterality: N/A;  . ESOPHAGOGASTRODUODENOSCOPY (EGD) WITH PROPOFOL N/A 07/09/2015   Procedure: ESOPHAGOGASTRODUODENOSCOPY (EGD) WITH PROPOFOL;  Surgeon: Manya Silvas, MD;  Location: [facility] ENDOSCOPY;  Service: Endoscopy;  Laterality: N/A;  . OVARY SURGERY    . TRACHEOSTOMY  1959  . TUBAL LIGATION      Medications Prior to Admission  Medication Sig Dispense Refill Last Dose  . acetaminophen (TYLENOL) 325 MG tablet Take 2 tablets (650 mg total) by mouth every 6 (six) hours as needed for mild pain (or Fever >/= 101).   Unknown at PRN  . albuterol (PROAIR HFA) 108 (90 Base) MCG/ACT inhaler USE 2 INHALATIONS EVERY 6 HOURS AS NEEDED FOR WHEEZING OR SHORTNESS OF BREATH (Patient taking differently: Inhale 2 puffs into the lungs every 6 (six) hours as needed for wheezing or shortness of breath. USE 2 INHALATIONS EVERY 6 HOURS AS NEEDED FOR WHEEZING OR SHORTNESS OF BREATH) 51 g 3 Unknown at PRN  . alendronate (FOSAMAX) 70 MG tablet Take 70 mg by mouth every Monday.    Past Week at Unknown time  .  ALPRAZolam (XANAX) 0.25 MG tablet Take 1 tablet (0.25 mg total) by mouth daily as needed for anxiety. (Patient taking differently: Take 0.25 mg by mouth 3 (three) times daily as needed for anxiety. ) 30 tablet 0 Unknown at PRN  . apixaban (ELIQUIS) 5 MG TABS tablet Take 5 mg by mouth 2 (two) times daily.   04/03/2019 at 1000  . Calcium Carbonate-Vitamin D (CALCIUM 600+D) 600-400 MG-UNIT tablet Take 1 tablet by mouth 2 (two) times daily.    04/03/2019 at 1000  . cetirizine (ZYRTEC) 10 MG tablet TAKE 1 TABLET  DAILY (Patient taking differently: Take 10 mg by mouth at bedtime. ) 90 tablet 4 04/02/2019 at 2000  . cholecalciferol (VITAMIN D) 1000 units tablet Take 1,000 Units by mouth daily.   Past Week at Unknown time  . colestipol (COLESTID) 1 g tablet Take 2 g by mouth daily.    04/03/2019 at 1000  . diclofenac sodium (VOLTAREN) 1 % GEL Apply 4 g topically 4 (four) times daily for 7 days. 100 g 0 Past Week at Unknown time  . diltiazem (CARDIZEM CD) 240 MG 24 hr capsule Take 1 capsule (240 mg total) by mouth daily. 30 capsule 0 04/03/2019 at 0830  . diltiazem (CARDIZEM) 30 MG tablet Take 30 mg by mouth 4 (four) times daily as needed (heartrate >100bpm).    Past Week at PRN  . fluticasone (FLONASE) 50 MCG/ACT nasal spray Place 2 sprays into both nostrils daily. 48 g 1 Past Week at Unknown time  . furosemide (LASIX) 40 MG tablet Take 40 mg by mouth daily.    04/03/2019 at 1000  . lip balm (BLISTEX) OINT Apply 1 application topically as needed for lip care. 1 Tube 0 Unknown at PRN  . meclizine (ANTIVERT) 25 MG tablet take 1/2 to 1 tablet every 8 hours if needed for VERTIGO (Patient taking differently: Take 12.5-25 mg by mouth 3 (three) times daily as needed for dizziness. ) 40 tablet 3 Unknown at PRN  . Multiple Vitamin (MULTIVITAMIN WITH MINERALS) TABS tablet Take 1 tablet by mouth daily.   04/02/2019 at Unknown time  . potassium chloride (MICRO-K) 10 MEQ CR capsule TAKE 2 CAPSULES BY MOUTH EVERY DAY (Patient taking differently: Take 20 mEq by mouth daily. ) 180 capsule 1 04/03/2019 at 1000  . predniSONE (DELTASONE) 5 MG tablet Take 1 tablet (5 mg total) by mouth daily with breakfast.   Past Week at Unknown time  . Probiotic Product (ALIGN PO) Take 1 tablet by mouth daily.   Past Week at Unknown time  . simvastatin (ZOCOR) 20 MG tablet TAKE 1 TABLET DAILY (Patient taking differently: Take 20 mg by mouth daily. ) 90 tablet 4 04/02/2019 at Unknown time  . sodium chloride (OCEAN) 0.65 % nasal spray Place 1 spray into  the nose as needed for congestion.   Unknown at PRN  . busPIRone (BUSPAR) 5 MG tablet Take 1 tablet (5 mg total) by mouth at bedtime as needed. (Patient not taking: Reported on 02/27/2019) 30 tablet 1 Not Taking at Unknown time  . esomeprazole (NEXIUM) 20 MG capsule Take 1 capsule (20 mg total) by mouth daily at 12 noon. (Patient not taking: Reported on 04/03/2019) 90 capsule 0 Not Taking at Unknown time  . FeFum-FePo-FA-B Cmp-C-Zn-Mn-Cu (TANDEM PLUS) 162-115.2-1 MG CAPS Take 1 capsule by mouth daily. (Patient not taking: Reported on 02/27/2019) 90 each 1 Not Taking at Unknown time  . guaiFENesin-dextromethorphan (ROBITUSSIN DM) 100-10 MG/5ML syrup Take 5 mLs by mouth every  4 (four) hours as needed for cough. (Patient not taking: Reported on 08/03/2018) 118 mL 0 Not Taking at Unknown time  . ondansetron (ZOFRAN ODT) 4 MG disintegrating tablet Take 1 tablet (4 mg total) by mouth every 8 (eight) hours as needed for nausea or vomiting. (Patient not taking: Reported on 04/03/2019) 20 tablet 0 Not Taking at Unknown time  . predniSONE (DELTASONE) 10 MG tablet Take 1 tablet (10 mg total) by mouth daily for 6 days. Take 4 pills for 2 days.  2 pills for 2 days.  And 1 pill for 2 days.  Then go back to your normal 5mg  prednisone. (Patient not taking: Reported on 04/03/2019) 15 tablet 0 Not Taking at Unknown time  . triamcinolone cream (KENALOG) 0.1 % Apply 1 application topically 2 (two) times daily. Prn (Patient not taking: Reported on 04/03/2019) 454 g 0 Not Taking at Unknown time   Social History   Socioeconomic History  . Marital status: Widowed    Spouse name: Not on file  . Number of children: 4  . Years of education: Not on file  . Highest education level: Not on file  Occupational History  . Occupation: retired  Scientific laboratory technician  . Financial resource strain: Not on file  . Food insecurity    Worry: Not on file    Inability: Not on file  . Transportation needs    Medical: Not on file    Non-medical: Not  on file  Tobacco Use  . Smoking status: Never Smoker  . Smokeless tobacco: Never Used  Substance and Sexual Activity  . Alcohol use: No    Alcohol/week: 0.0 standard drinks  . Drug use: No  . Sexual activity: Never  Lifestyle  . Physical activity    Days per week: Not on file    Minutes per session: Not on file  . Stress: Not on file  Relationships  . Social Herbalist on phone: Not on file    Gets together: Not on file    Attends religious service: Not on file    Active member of club or organization: Not on file    Attends meetings of clubs or organizations: Not on file    Relationship status: Not on file  . Intimate partner violence    Fear of current or ex partner: Not on file    Emotionally abused: Not on file    Physically abused: Not on file    Forced sexual activity: Patient refused  Other Topics Concern  . Not on file  Social History Narrative   Lives with family at home    Family History  Problem Relation Age of Onset  . Heart disease Father        MI  . Heart disease Mother   . Valvular heart disease Mother   . Breast cancer Sister 68  . Cancer Sister        Lung cancer  . COPD Sister   . Heart disease Sister   . Colon cancer Neg Hx     Review of systems complete and found to be negative unless listed above    PHYSICAL EXAM  General: Well developed, well nourished, in no acute distress HEENT:  Normocephalic and atramatic Neck:  No JVD.  Lungs: Clear bilaterally to auscultation and percussion. Heart: Irregularly irregular rhythm. Rapid rate in the 110s. Normal S1 and S2 without gallops or murmurs.  Extremities: No clubbing, cyanosis or edema.   Neuro: Alert and oriented X  3. Psych:  Good affect, responds appropriately  Labs:   Lab Results  Component Value Date   WBC 8.8 04/04/2019   HGB 10.6 (L) 04/04/2019   HCT 31.8 (L) 04/04/2019   MCV 90.1 04/04/2019   PLT 274 04/04/2019    Recent Labs  Lab 03/29/19 1528  04/04/19 0511   NA 137   < > 137  K 3.8   < > 3.8  CL 99   < > 102  CO2 26   < > 26  BUN 11   < > 16  CREATININE 0.96   < > 0.81  CALCIUM 9.3   < > 8.6*  PROT 8.0  --   --   BILITOT 0.7  --   --   ALKPHOS 48  --   --   ALT 25  --   --   AST 36  --   --   GLUCOSE 124*   < > 109*   < > = values in this interval not displayed.   Lab Results  Component Value Date   CKTOTAL 138 11/01/2014   CKMB 2.9 11/01/2014   TROPONINI 0.08 (HH) 06/24/2017    Lab Results  Component Value Date   CHOL 141 07/12/2018   CHOL 142 07/14/2017   CHOL 153 02/17/2017   Lab Results  Component Value Date   HDL 81.80 07/12/2018   HDL 79.10 07/14/2017   HDL 80.40 02/17/2017   Lab Results  Component Value Date   LDLCALC 43 07/12/2018   LDLCALC 40 07/14/2017   LDLCALC 46 02/17/2017   Lab Results  Component Value Date   TRIG 80.0 07/12/2018   TRIG 112.0 07/14/2017   TRIG 135.0 02/17/2017   Lab Results  Component Value Date   CHOLHDL 2 07/12/2018   CHOLHDL 2 07/14/2017   CHOLHDL 2 02/17/2017   Lab Results  Component Value Date   LDLDIRECT 73.7 08/20/2013      Radiology: Dg Chest 2 View  Result Date: 04/03/2019 CLINICAL DATA:  Atrial fibrillation. EXAM: CHEST - 2 VIEW COMPARISON:  Chest x-ray 02/27/2019 FINDINGS: The cardiac silhouette, mediastinal and hilar contours are within normal limits and stable. The lungs are clear. Mild eventration of the right hemidiaphragm is stable. No worrisome pulmonary lesions. No pleural effusions. The bony thorax is intact. IMPRESSION: No acute cardiopulmonary findings. Electronically Signed   By: Marijo Sanes M.D.   On: 04/03/2019 13:16    EKG:  Atrial fibrillation, Rate of 116 BPM, normal axis, isolated ST depression in lead II, no continuous changes  ASSESSMENT AND PLAN:  Ms. Tasha is a 78 year old female with a history significant for persistent atrial fibrillation, and rheumatoid arthritis on steroid taper, who presented to the ED for A-fib with RVR in the 120s.  She is now on diltiazem drip but heart rate remains in the 110s. She is asymptomatic at this time without chest pain, shortness of breath, or pre-syncope. Recent uncontrolled rate may be related to steroid taper and component of anxiety. Goal will be to control rate to <90 BPM. May consider rhythm control strategy in outpatient setting.   Persistent Atrial fibrillation with RVR:  - Continue diltiazem drip but plan to convert to oral. Will administer home dose of diltiazem at 240 mg this AM with plan to D/C diltiazem drip likely this afternoon.  - Add metoprolol succinate at 25 mg daily. First dose this AM. May increase dose to 50 mg if heart rate still >100 BPM.  Elevated troponin: Troponin trend: 73 > 85 > 91 > 88. Troponin elevation has been stable, likely related to chronic myocardial injury versus demand ischemic from rapid heart rate. Patient is chest pain free.  - No further cardiac diagnostics or interventions indicated at this time.   Close outpatient follow up with Dr. Ubaldo Glassing or Doristine Mango next week.   The patient's history and exam findings were discussed with Dr. Nehemiah Massed. The plan was made in conjunction with Dr. Nehemiah Massed.  Signed: Hilbert Odor PA-C 04/04/2019, 8:33 AM  The patient has been interviewed and examined. I agree with assessment and plan above. Serafina Royals MD [facility]

## 2019-04-04 NOTE — H&P (Signed)
[facility] [place] NAME: Kaitlyn Huff    MR#:  [id number]  DATE OF BIRTH:  Mar 13, 1941  DATE OF ADMISSION:  04/03/2019  PRIMARY CARE PHYSICIAN: [name], MD   REQUESTING/REFERRING PHYSICIAN: stafford  CHIEF COMPLAINT:   Chief Complaint  Patient presents with  . Atrial Fibrillation    HISTORY OF PRESENT ILLNESS: Kaitlyn Huff  is a 78 y.o. female with a known history of anemia, diverticulitis, gastroesophageal reflux disease, hyperlipidemia, hypertension, osteoarthritis, osteoporosis, rheumatoid arthritis, valvular heart disease-was recently found to have paroxysmal atrial fibrillation and started on Cardizem and anticoagulation by cardiologist.  Patient is taking medication as advised.  For rheumatoid arthritis she was also started on oral tapering steroid for last few days.  She started feeling more anxious with generalized weakness and feeling of chest palpitations for last few days.  Concern with this her home health visiting nurse advised to go to emergency room.  She was started on Cardizem IV drip for rate control and given to hospitalist team for further management.  Patient denied any associated chest pain.  PAST MEDICAL HISTORY:   Past Medical History:  Diagnosis Date  . Anemia   . Cancer ([place])   . Diverticulitis   . GERD (gastroesophageal reflux disease)   . Hyperlipidemia   . Hypertension   . Osteoarthritis   . Osteoporosis    actonel  . Positive PPD    s/p INH (2006)  . Rheumatoid arthritis(714.0)    MTX transaminitis, Leflunomide (rash), enbrel, plaquinil, prednisone, remicade, Imuran (transaminitis)  . Valvular heart disease    moderate MR and TR    PAST SURGICAL HISTORY:  Past Surgical History:  Procedure Laterality Date  . ABDOMINAL HYSTERECTOMY    . CERVICAL CONE BIOPSY    . CHOLECYSTECTOMY  06/22/14  . COLONOSCOPY WITH PROPOFOL N/A 07/09/2015   Procedure: COLONOSCOPY WITH PROPOFOL;  Surgeon: Manya Silvas, MD;  Location: [facility] ENDOSCOPY;  Service: Endoscopy;  Laterality: N/A;  . ESOPHAGOGASTRODUODENOSCOPY (EGD) WITH PROPOFOL N/A 07/09/2015   Procedure: ESOPHAGOGASTRODUODENOSCOPY (EGD) WITH PROPOFOL;  Surgeon: Manya Silvas, MD;  Location: [facility] ENDOSCOPY;  Service: Endoscopy;  Laterality: N/A;  . OVARY SURGERY    . TRACHEOSTOMY  1959  . TUBAL LIGATION      SOCIAL HISTORY:  Social History   Tobacco Use  . Smoking status: Never Smoker  . Smokeless tobacco: Never Used  Substance Use Topics  . Alcohol use: No    Alcohol/week: 0.0 standard drinks    FAMILY HISTORY:  Family History  Problem Relation Age of Onset  . Heart disease Father        MI  . Heart disease Mother   . Valvular heart disease Mother   . Breast cancer Sister 56  . Cancer Sister        Lung cancer  . COPD Sister   . Heart disease Sister   . Colon cancer Neg Hx     DRUG ALLERGIES:  Allergies  Allergen Reactions  . Tramadol Itching  . Astelin [Azelastine Hcl] Other (See Comments)    Reaction:  Unknown   . Codeine Other (See Comments)    Reaction:  Altered mental status  . Dilaudid [Hydromorphone] Other (See Comments)    Reaction:  Unknown   . Flexeril [Cyclobenzaprine] Other (See Comments)    Reaction:  Unknown   . Imuran [Azathioprine] Other (See Comments)    Reaction:  Abnormal liver function  . Lisinopril Itching  . Lyrica [  Pregabalin] Other (See Comments)    Reaction:  Sore gums   . Methotrexate Derivatives Other (See Comments)    Reaction:  Abnormal liver function  . Amiodarone Anxiety and Other (See Comments)    Pt could not eat or sleep. Caused n/v and "messed her up"  . Amoxicillin Rash and Other (See Comments)    Unable to obtain enough information to answer additional questions about this medication.    Jolee Ewing [Leflunomide] Rash  . Clindamycin/Lincomycin Rash  . Doxycycline Rash  . Lodine [Etodolac] Rash  . Percocet [Oxycodone-Acetaminophen] Rash  . Sulfa Antibiotics Rash     REVIEW OF SYSTEMS:   CONSTITUTIONAL: No fever, have fatigue or weakness.  EYES: No blurred or double vision.  EARS, NOSE, AND THROAT: No tinnitus or ear pain.  RESPIRATORY: No cough, shortness of breath, wheezing or hemoptysis.  CARDIOVASCULAR: No chest pain, orthopnea, edema.  Have palpitation. GASTROINTESTINAL: No nausea, vomiting, diarrhea or abdominal pain.  GENITOURINARY: No dysuria, hematuria.  ENDOCRINE: No polyuria, nocturia,  HEMATOLOGY: No anemia, easy bruising or bleeding SKIN: No rash or lesion. MUSCULOSKELETAL: No joint pain or arthritis.   NEUROLOGIC: No tingling, numbness, weakness.  PSYCHIATRY: No anxiety or depression.   MEDICATIONS AT HOME:  Prior to Admission medications   Medication Sig Start Date End Date Taking? Authorizing Provider  acetaminophen (TYLENOL) 325 MG tablet Take 2 tablets (650 mg total) by mouth every 6 (six) hours as needed for mild pain (or Fever >/= 101). 06/26/17  Yes Gouru, Illene Silver, MD  albuterol (PROAIR HFA) 108 (90 Base) MCG/ACT inhaler USE 2 INHALATIONS EVERY 6 HOURS AS NEEDED FOR WHEEZING OR SHORTNESS OF BREATH Patient taking differently: Inhale 2 puffs into the lungs every 6 (six) hours as needed for wheezing or shortness of breath. USE 2 INHALATIONS EVERY 6 HOURS AS NEEDED FOR WHEEZING OR SHORTNESS OF BREATH 06/14/18  Yes [name], MD  alendronate (FOSAMAX) 70 MG tablet Take 70 mg by mouth every Monday.    Yes [provider]  ALPRAZolam (XANAX) 0.25 MG tablet Take 1 tablet (0.25 mg total) by mouth daily as needed for anxiety. Patient taking differently: Take 0.25 mg by mouth 3 (three) times daily as needed for anxiety.  02/20/19  Yes [name], MD  apixaban (ELIQUIS) 5 MG TABS tablet Take 5 mg by mouth 2 (two) times daily.   Yes [provider]  Calcium Carbonate-Vitamin D (CALCIUM 600+D) 600-400 MG-UNIT tablet Take 1 tablet by mouth 2 (two) times daily.    Yes [provider]  cetirizine (ZYRTEC) 10  MG tablet TAKE 1 TABLET DAILY Patient taking differently: Take 10 mg by mouth at bedtime.  04/10/18  Yes [name], MD  cholecalciferol (VITAMIN D) 1000 units tablet Take 1,000 Units by mouth daily.   Yes [provider]  colestipol (COLESTID) 1 g tablet Take 2 g by mouth daily.    Yes [provider]  diclofenac sodium (VOLTAREN) 1 % GEL Apply 4 g topically 4 (four) times daily for 7 days. 03/29/19 04/05/19 Yes Vanessa [place], MD  diltiazem (CARDIZEM CD) 240 MG 24 hr capsule Take 1 capsule (240 mg total) by mouth daily. 03/01/19  Yes Epifanio Lesches, MD  diltiazem (CARDIZEM) 30 MG tablet Take 30 mg by mouth 4 (four) times daily as needed (heartrate >100bpm).  03/20/19 03/19/20 Yes [provider]  fluticasone (FLONASE) 50 MCG/ACT nasal spray Place 2 sprays into both nostrils daily. 02/19/19  Yes [name], MD  furosemide (LASIX) 40 MG tablet  Take 40 mg by mouth daily.    Yes [provider]  lip balm (BLISTEX) OINT Apply 1 application topically as needed for lip care. 06/21/16  Yes Loletha Grayer, MD  meclizine (ANTIVERT) 25 MG tablet take 1/2 to 1 tablet every 8 hours if needed for VERTIGO Patient taking differently: Take 12.5-25 mg by mouth 3 (three) times daily as needed for dizziness.  11/17/17  Yes [name], MD  Multiple Vitamin (MULTIVITAMIN WITH MINERALS) TABS tablet Take 1 tablet by mouth daily.   Yes [provider]  potassium chloride (MICRO-K) 10 MEQ CR capsule TAKE 2 CAPSULES BY MOUTH EVERY DAY Patient taking differently: Take 20 mEq by mouth daily.  11/22/18  Yes [name], MD  predniSONE (DELTASONE) 5 MG tablet Take 1 tablet (5 mg total) by mouth daily with breakfast. 12/23/16  Yes Mody, Sital, MD  Probiotic Product (ALIGN PO) Take 1 tablet by mouth daily.   Yes [provider]  simvastatin (ZOCOR) 20 MG tablet TAKE 1 TABLET DAILY Patient taking differently: Take 20 mg by mouth daily.  07/18/18  Yes [name], MD  sodium chloride (OCEAN) 0.65 % nasal spray Place 1 spray into the nose as needed for congestion.   Yes [provider]  busPIRone (BUSPAR) 5 MG tablet Take 1 tablet (5 mg total) by mouth at bedtime as needed. Patient not taking: Reported on 02/27/2019 07/12/18   [name], MD  esomeprazole (NEXIUM) 20 MG capsule Take 1 capsule (20 mg total) by mouth daily at 12 noon. Patient not taking: Reported on 04/03/2019 09/06/18   [name], MD  FeFum-FePo-FA-B Cmp-C-Zn-Mn-Cu (TANDEM PLUS) 162-115.2-1 MG CAPS Take 1 capsule by mouth daily. Patient not taking: Reported on 02/27/2019 05/21/18   [name], MD  guaiFENesin-dextromethorphan [facility] DM) 100-10 MG/5ML syrup Take 5 mLs by mouth every 4 (four) hours as needed for cough. Patient not taking: Reported on 08/03/2018 08/07/15   Theodoro Grist, MD  ondansetron (ZOFRAN ODT) 4 MG disintegrating tablet Take 1 tablet (4 mg total) by mouth every 8 (eight) hours as needed for nausea or vomiting. Patient not taking: Reported on 04/03/2019 11/19/16   Lavonia Drafts, MD  predniSONE (DELTASONE) 10 MG tablet Take 1 tablet (10 mg total) by mouth daily for 6 days. Take 4 pills for 2 days.  2 pills for 2 days.  And 1 pill for 2 days.  Then go back to your normal 5mg  prednisone. Patient not taking: Reported on 04/03/2019 03/29/19 04/04/19  Vanessa Cloverdale, MD  triamcinolone cream (KENALOG) 0.1 % Apply 1 application topically 2 (two) times daily. Prn Patient not taking: Reported on 04/03/2019 12/21/17   McLean-Scocuzza, Nino Glow, MD      PHYSICAL EXAMINATION:   VITAL SIGNS: Blood pressure 118/72, pulse 67, temperature 98.7 F (37.1 C), temperature source Oral, resp. rate 18, height 5\' 5"  (1.651 m), weight 57.3 kg, SpO2 96 %.  GENERAL:  78 y.o.-year-old patient lying in the bed with no acute distress.  EYES: Pupils equal, round, reactive to light and accommodation. No scleral icterus. Extraocular muscles intact.  HEENT: Head atraumatic,  normocephalic. Oropharynx and nasopharynx clear.  NECK:  Supple, no jugular venous distention. No thyroid enlargement, no tenderness.  LUNGS: Normal breath sounds bilaterally, no wheezing, rales,rhonchi or crepitation. No use of accessory muscles of respiration.  CARDIOVASCULAR: S1, S2 irregularly irregular and fast. No murmurs, rubs, or gallops.  ABDOMEN: Soft, nontender, nondistended. Bowel sounds present. No organomegaly or mass.  EXTREMITIES: No pedal edema, cyanosis, or  clubbing.  NEUROLOGIC: Cranial nerves II through XII are intact. Muscle strength 5/5 in all extremities. Sensation intact. Gait not checked.  PSYCHIATRIC: The patient is alert and oriented x 3.  SKIN: No obvious rash, lesion, or ulcer.   LABORATORY PANEL:   CBC Recent Labs  Lab 03/29/19 1528 04/03/19 1246  WBC 11.2* 11.2*  HGB 11.9* 11.4*  HCT 35.5* 33.8*  PLT 252 292  MCV 90.3 89.7  MCH 30.3 30.2  MCHC 33.5 33.7  RDW 12.7 12.4   ------------------------------------------------------------------------------------------------------------------  Chemistries  Recent Labs  Lab 03/29/19 1528 04/03/19 1246  NA 137 133*  K 3.8 2.9*  CL 99 94*  CO2 26 25  GLUCOSE 124* 106*  BUN 11 14  CREATININE 0.96 0.89  CALCIUM 9.3 9.0  MG  --  1.9  AST 36  --   ALT 25  --   ALKPHOS 48  --   BILITOT 0.7  --    ------------------------------------------------------------------------------------------------------------------ estimated creatinine clearance is 49.1 mL/min (by C-G formula based on SCr of 0.89 mg/dL). ------------------------------------------------------------------------------------------------------------------ No results for input(s): TSH, T4TOTAL, T3FREE, THYROIDAB in the last 72 hours.  Invalid input(s): FREET3   Coagulation profile Recent Labs  Lab 04/03/19 1246  INR 1.4*    ------------------------------------------------------------------------------------------------------------------- No results for input(s): DDIMER in the last 72 hours. -------------------------------------------------------------------------------------------------------------------  Cardiac Enzymes No results for input(s): CKMB, TROPONINI, MYOGLOBIN in the last 168 hours.  Invalid input(s): CK ------------------------------------------------------------------------------------------------------------------ Invalid input(s): POCBNP  ---------------------------------------------------------------------------------------------------------------  Urinalysis    Component Value Date/Time   COLORURINE YELLOW (A) 02/27/2019 0355   APPEARANCEUR CLEAR (A) 02/27/2019 0355   APPEARANCEUR Clear 11/02/2014 0225   LABSPEC 1.009 02/27/2019 0355   LABSPEC 1.010 11/02/2014 0225   PHURINE 8.0 02/27/2019 0355   GLUCOSEU NEGATIVE 02/27/2019 0355   GLUCOSEU Negative 11/02/2014 0225   HGBUR NEGATIVE 02/27/2019 0355   BILIRUBINUR NEGATIVE 02/27/2019 0355   BILIRUBINUR Negative 11/02/2014 0225   KETONESUR NEGATIVE 02/27/2019 0355   PROTEINUR NEGATIVE 02/27/2019 0355   NITRITE NEGATIVE 02/27/2019 0355   LEUKOCYTESUR NEGATIVE 02/27/2019 0355   LEUKOCYTESUR Negative 11/02/2014 0225     RADIOLOGY: Dg Chest 2 View  Result Date: 04/03/2019 CLINICAL DATA:  Atrial fibrillation. EXAM: CHEST - 2 VIEW COMPARISON:  Chest x-ray 02/27/2019 FINDINGS: The cardiac silhouette, mediastinal and hilar contours are within normal limits and stable. The lungs are clear. Mild eventration of the right hemidiaphragm is stable. No worrisome pulmonary lesions. No pleural effusions. The bony thorax is intact. IMPRESSION: No acute cardiopulmonary findings. Electronically Signed   By: Marijo Sanes M.D.   On: 04/03/2019 13:16    EKG: Orders placed or performed during the hospital encounter of 04/03/19  . EKG 12-Lead  .  EKG 12-Lead  . ED EKG  . ED EKG    IMPRESSION AND PLAN:  *Paroxysmal atrial fibrillation with rapid ventricular response. Started on Cardizem IV drip for better control .  Follow serial troponin. Patient's TSH was normal last week. Get cardiology consultation . *Hypokalemia Magnesium is normal.  Replace oral and IV.  *Hyperlipidemia Continue statin.   All the records are reviewed and case discussed with ED provider. Management plans discussed with the patient, family and they are in agreement.  CODE STATUS: Full code.    Code Status Orders  (From admission, onward)         Start     Ordered   04/03/19 1846  Full code  Continuous     04/03/19 1845        Code  Status History    Date Active Date Inactive Code Status Order ID Comments User Context   02/27/2019 0620 03/03/2019 1549 Full Code [id number]  Sidney Ace Arvella Merles, MD ED   06/24/2017 0655 06/26/2017 1640 Full Code [id number]  Harrie Foreman, MD Inpatient   12/22/2016 1725 12/23/2016 1640 Partial Code [id number]  Hillary Bow, MD ED   12/22/2016 1710 12/22/2016 1725 Full Code [id number]  Hillary Bow, MD ED   10/31/2016 1757 11/02/2016 1519 Full Code [id number]  Demetrios Loll, MD Inpatient   06/19/2016 1320 06/21/2016 1529 DNR [id number]  Loletha Grayer, MD Inpatient   06/17/2016 0201 06/19/2016 1320 Full Code [id number]  Saundra Shelling, MD ED   08/18/2015 1628 08/22/2015 1615 Full Code [id number]  Hower, Aaron Mose, MD ED   08/03/2015 0949 08/07/2015 1411 Full Code [id number]  Saundra Shelling, MD Inpatient   Advance Care Planning Activity     Patient daughter was present in the room she told me that they would like to change the CODE STATUS from DNR to full code.  TOTAL TIME TAKING CARE OF THIS PATIENT: 45 minutes.    Vaughan Basta M.D on 04/04/2019   Between 7am to 6pm - Pager - [phone]  After 6pm go to [email or web address] - password [email or web address] [facility]  Office  [phone]  CC: Primary care  physician; [name], MD   Note: This dictation was prepared with Dragon dictation along with smaller phrase technology. Any transcriptional errors that result from this process are unintentional.

## 2019-04-04 NOTE — Progress Notes (Signed)
No new orders received.

## 2019-04-04 NOTE — Care Management Important Message (Signed)
Important Message  Patient Details  Name: Kaitlyn Huff MRN: [id number] Date of Birth: 04-27-1941   Medicare Important Message Given:  Yes  Initial Medicare IM given by Patient Access Associate on 04/04/2019 at 11:27am.    Dannette Barbara 04/04/2019, 1:30 PM

## 2019-04-04 NOTE — Progress Notes (Signed)
[facility] [place] NAME: Kaitlyn Huff    MR#:  [id number]  DATE OF BIRTH:  27-Jun-1941  SUBJECTIVE:  CHIEF COMPLAINT:   Chief Complaint  Patient presents with  . Atrial Fibrillation   -Feels much better this morning.  Less palpitations.  Remains in A. fib, rate is improved when at rest.  Remains on Cardizem drip.  REVIEW OF SYSTEMS:  Review of Systems  Constitutional: Positive for malaise/fatigue. Negative for chills and fever.  HENT: Negative for congestion, ear discharge, hearing loss, nosebleeds and tinnitus.   Eyes: Negative for blurred vision and double vision.  Respiratory: Negative for cough, shortness of breath and wheezing.   Cardiovascular: Positive for palpitations. Negative for chest pain.  Gastrointestinal: Negative for abdominal pain, constipation, diarrhea, nausea and vomiting.  Genitourinary: Negative for dysuria.  Musculoskeletal: Negative for myalgias.  Neurological: Negative for dizziness, focal weakness, seizures, weakness and headaches.  Psychiatric/Behavioral: Negative for depression.    DRUG ALLERGIES:   Allergies  Allergen Reactions  . Tramadol Itching  . Astelin [Azelastine Hcl] Other (See Comments)    Reaction:  Unknown   . Codeine Other (See Comments)    Reaction:  Altered mental status  . Dilaudid [Hydromorphone] Other (See Comments)    Reaction:  Unknown   . Flexeril [Cyclobenzaprine] Other (See Comments)    Reaction:  Unknown   . Imuran [Azathioprine] Other (See Comments)    Reaction:  Abnormal liver function  . Lisinopril Itching  . Lyrica [Pregabalin] Other (See Comments)    Reaction:  Sore gums   . Methotrexate Derivatives Other (See Comments)    Reaction:  Abnormal liver function  . Amiodarone Anxiety and Other (See Comments)    Pt could not eat or sleep. Caused n/v and "messed her up"  . Amoxicillin Rash and Other (See Comments)    Unable to obtain enough information to answer  additional questions about this medication.    Jolee Ewing [Leflunomide] Rash  . Clindamycin/Lincomycin Rash  . Doxycycline Rash  . Lodine [Etodolac] Rash  . Percocet [Oxycodone-Acetaminophen] Rash  . Sulfa Antibiotics Rash    VITALS:  Blood pressure 115/70, pulse 88, temperature 98.6 F (37 C), temperature source Oral, resp. rate 17, height 5\' 5"  (1.651 m), weight 57.8 kg, SpO2 95 %.  PHYSICAL EXAMINATION:  Physical Exam   GENERAL:  78 y.o.-year-old patient lying in the bed with no acute distress.  EYES: Pupils equal, round, reactive to light and accommodation. No scleral icterus. Extraocular muscles intact.  HEENT: Head atraumatic, normocephalic. Oropharynx and nasopharynx clear.  NECK:  Supple, no jugular venous distention. No thyroid enlargement, no tenderness.  LUNGS: Normal breath sounds bilaterally, no wheezing, rales,rhonchi or crepitation. No use of accessory muscles of respiration.  Decreased bibasilar breath sounds CARDIOVASCULAR: S1, S2 normal. No  rubs, or gallops. 3/6 systolic murmur in place ABDOMEN: Soft, nontender, nondistended. Bowel sounds present. No organomegaly or mass.  EXTREMITIES: No pedal edema, cyanosis, or clubbing.  NEUROLOGIC: Cranial nerves II through XII are intact. Muscle strength 5/5 in all extremities. Sensation intact. Gait not checked.  PSYCHIATRIC: The patient is alert and oriented x 3.  SKIN: No obvious rash, lesion, or ulcer.    LABORATORY PANEL:   CBC Recent Labs  Lab 04/04/19 0511  WBC 8.8  HGB 10.6*  HCT 31.8*  PLT 274   ------------------------------------------------------------------------------------------------------------------  Chemistries  Recent Labs  Lab 03/29/19 1528  04/04/19 0511  NA 137   < > 137  K 3.8   < > 3.8  CL 99   < > 102  CO2 26   < > 26  GLUCOSE 124*   < > 109*  BUN 11   < > 16  CREATININE 0.96   < > 0.81  CALCIUM 9.3   < > 8.6*  MG  --    < > 2.0  AST 36  --   --   ALT 25  --   --   ALKPHOS 48   --   --   BILITOT 0.7  --   --    < > = values in this interval not displayed.   ------------------------------------------------------------------------------------------------------------------  Cardiac Enzymes No results for input(s): TROPONINI in the last 168 hours. ------------------------------------------------------------------------------------------------------------------  RADIOLOGY:  Dg Chest 2 View  Result Date: 04/03/2019 CLINICAL DATA:  Atrial fibrillation. EXAM: CHEST - 2 VIEW COMPARISON:  Chest x-ray 02/27/2019 FINDINGS: The cardiac silhouette, mediastinal and hilar contours are within normal limits and stable. The lungs are clear. Mild eventration of the right hemidiaphragm is stable. No worrisome pulmonary lesions. No pleural effusions. The bony thorax is intact. IMPRESSION: No acute cardiopulmonary findings. Electronically Signed   By: Marijo Sanes M.D.   On: 04/03/2019 13:16    EKG:   Orders placed or performed during the hospital encounter of 04/03/19  . EKG 12-Lead  . EKG 12-Lead  . ED EKG  . ED EKG    ASSESSMENT AND PLAN:   78 year old female with past medical history significant for A. fib on anticoagulation, hypertension, GERD, osteoarthritis, anemia of chronic disease, rheumatoid arthritis [place] to hospital secondary to worsening palpitations and noted to be in A. fib RVR  1.  Atrial fibrillation with rapid ventricular response-anxiety at baseline.  Worsening palpitations for a couple of days -Noted to be in rapid ventricular response on admission. -Started on Cardizem drip.  Rate is better controlled this morning.  Restarted oral Cardizem.  Also added metoprolol -Appreciate cardiology consult. -Wean off Cardizem drip -On Eliquis for anticoagulation.  2.  Elevated troponin-secondary to demand ischemia from elevated heart rate.  No acute coronary syndrome.  3.  Arthritis-osteoarthritis and rheumatoid arthritis-continue low-dose prednisone daily.  4.   GERD-on PPI  5.  DVT prophylaxis-on Eliquis  Physical therapy consulted     All the records are reviewed and case discussed with Care Management/Social Workerr. Management plans discussed with the patient, family and they are in agreement.  CODE STATUS: Full Code  TOTAL TIME TAKING CARE OF THIS PATIENT: 38 minutes.   POSSIBLE D/C TOMORROW, DEPENDING ON CLINICAL CONDITION.   Gladstone Lighter M.D on 04/04/2019 at 1:18 PM  Between 7am to 6pm - Pager - [phone]  After 6pm go to [email or web address] - password [facility]  Office  [phone]  CC: Primary care physician; [name], MD

## 2019-04-04 NOTE — Progress Notes (Signed)
heart rate dropping into 40's with pauses, Cardizem gtt discontinued.  Text page out to Hilbert Odor, [place]

## 2019-04-04 NOTE — Evaluation (Signed)
Physical Therapy Evaluation Patient Details Name: Kaitlyn Huff MRN: [id number] DOB: [birth year] Today's Date: 04/04/2019   History of Present Illness  Kaitlyn Huff is a 78 y.o. female with a history of cancer, GERD, hypertension, paroxysmal atrial fibrillation and rheumatoid arthritis who presented to the ED on 09/23 complaining of feeling anxious and generalized weakness with palpitations. Pt was determined to be in A-fib with RVR and hypokalemic; pt was started on Cardizem drip 09/23 and discontinued 09/24. Of note, pt also with recent ER visits in the last 2 months for thumb and LE swelling (determined to be a result of RA) and sepsis.  Clinical Impression  Pt is a 78 y/o pleasant female with a PMH cancer, GERD, HTN, paroxysmal A-fib and RA presenting to the ER on 09/23 with complaints of feeling anxious and "chest flutters". Pt was previously independent with ADLs and community ambulation without use of AD (though limiting in recent months as a result of COVID). Pt performs bed mobility with supervision, transfers and ambulation with CGA for safety. Pt ambulates 225' with RW with CGA for safety, demonstrating gait pattern within normal limits. Pt demos appropriate response to activity today with HR increasing to 91 during ambulation and returning to 76 with brief rest break; O2 remaining >95%. Will keep on caseload to continue to monitor cardiac response to activity. Pt may benefit from cardiac rehab upon DC.    Follow Up Recommendations No PT follow up(No formal PT follow up needed at DC; cardiac rehab referral may be beneficial. Will keep on caseload to monitor activity tolerance and response to activity given cardiac status.)    Equipment Recommendations  Rolling walker with 5" wheels    Recommendations for Other Services       Precautions / Restrictions Precautions Precautions: Fall Restrictions Weight Bearing Restrictions: No      Mobility  Bed Mobility Overal bed mobility:  Needs Assistance Bed Mobility: Supine to Sit     Supine to sit: Supervision        Transfers Overall transfer level: Needs assistance Equipment used: Rolling walker (2 wheeled) Transfers: Sit to/from Stand Sit to Stand: Min guard            Ambulation/Gait Ambulation/Gait assistance: Min guard Gait Distance (Feet): 225 Feet Assistive device: Rolling walker (2 wheeled) Gait Pattern/deviations: WFL(Within Functional Limits)     General Gait Details: HR and O2 monitored throughout; HR increasing to 91 with gait and returns to 76 with brief rest.  Stairs            Wheelchair Mobility    Modified Rankin (Stroke Patients Only)       Balance Overall balance assessment: Needs assistance Sitting-balance support: Feet supported Sitting balance-Leahy Scale: Good     Standing balance support: Bilateral upper extremity supported Standing balance-Leahy Scale: Good                               Pertinent Vitals/Pain Pain Assessment: No/denies pain    Home Living Family/patient expects to be discharged to:: Private residence Living Arrangements: Children Available Help at Discharge: Family;Available 24 hours/day Type of Home: House Home Access: Stairs to enter Entrance Stairs-Rails: Can reach both Entrance Stairs-Number of Steps: 5 Home Layout: Laundry or work area in basement;Able to live on main level with bedroom/bathroom(reports she occasionally uses stairs to basement but able to live on main level) 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, contracting      Prior Function  Level of Independence: Independent         Comments: Independent with ADLs and household/community ambulation (though reports has been staying home in light of COVID)     Hand Dominance        Extremity/Trunk Assessment   Upper Extremity Assessment Upper Extremity Assessment: Overall WFL for tasks assessed    Lower Extremity Assessment Lower Extremity Assessment: Overall WFL for tasks  assessed    Cervical / Trunk Assessment Cervical / Trunk Assessment: Normal  Communication   Communication: No difficulties  Cognition Arousal/Alertness: Awake/alert Behavior During Therapy: WFL for tasks assessed/performed Overall Cognitive Status: Within Functional Limits for tasks assessed                                        General Comments      Exercises General Exercises - Lower Extremity Ankle Circles/Pumps: AROM;Both;20 reps;Supine Heel Slides: AROM;Both;10 reps;Supine   Assessment/Plan    PT Assessment Patient needs continued PT services  PT Problem List Decreased strength;Decreased activity tolerance;Decreased balance       PT Treatment Interventions DME instruction;Therapeutic exercise;Gait training;Balance training;Stair training;Neuromuscular re-education;Functional mobility training;Therapeutic activities;Patient/family education    PT Goals (Current goals can be found in the Care Plan section)  Acute Rehab PT Goals Patient Stated Goal: to go home    Frequency Min 2X/week   Barriers to discharge        Co-evaluation               AM-PAC PT "6 Clicks" Mobility  Outcome Measure Help needed turning from your back to your side while in a flat bed without using bedrails?: None Help needed moving from lying on your back to sitting on the side of a flat bed without using bedrails?: None Help needed moving to and from a bed to a chair (including a wheelchair)?: A Little Help needed standing up from a chair using your arms (e.g., wheelchair or bedside chair)?: A Little Help needed to walk in hospital room?: A Little Help needed climbing 3-5 steps with a railing? : A Little 6 Click Score: 20    End of Session Equipment Utilized During Treatment: Gait belt Activity Tolerance: Patient tolerated treatment well Patient left: in chair;with call bell/phone within reach;with chair alarm set Nurse Communication: Mobility status PT Visit  Diagnosis: Unsteadiness on feet (R26.81);Muscle weakness (generalized) (M62.81)    Time: [id number] PT Time Calculation (min) (ACUTE ONLY): 22 min   Charges:   PT Evaluation $PT Eval Moderate Complexity: 1 Mod PT Treatments $Therapeutic Exercise: 8-22 mins(ankle pumps, heel slides, sit to stand (monitoring vitals for response))        Petra Kuba, PT, DPT 04/04/19, 2:08 PM

## 2019-04-04 NOTE — Progress Notes (Signed)
Family Meeting Note  Advance Directive: Yes.  Today a meeting took place with the patient and her daughter.   The following clinical team members were present during this meeting: MD.  The following were discussed:Patient's diagnosis: Paroxysmal atrial fibrillation with rapid ventricular rate.  Hypokalemia, Patient's progosis: Unable to determine and Goals for treatment: Patient want to be full code.  Additional follow-up to be provided: Cardiologist.  Time spent during discussion: 16 minutes.  Vaughan Basta, MD

## 2019-04-05 LAB — BASIC METABOLIC PANEL
Anion gap: 11 (ref 5–15)
BUN: 19 mg/dL (ref 8–23)
CO2: 24 mmol/L (ref 22–32)
Calcium: 9.1 mg/dL (ref 8.9–10.3)
Chloride: 101 mmol/L (ref 98–111)
Creatinine, Ser: 0.88 mg/dL (ref 0.44–1.00)
GFR calc Af Amer: >60 mL/min (ref >60)
GFR calc non Af Amer: >60 mL/min (ref >60)
Glucose, Bld: 102 mg/dL — ABNORMAL HIGH (ref 70–99)
Potassium: 4 mmol/L (ref 3.5–5.1)
Sodium: 136 mmol/L (ref 135–145)

## 2019-04-05 MED ORDER — METOPROLOL SUCCINATE ER 25 MG PO TB24: 25.0000 mg | ORAL_TABLET | Freq: Every day | ORAL | 2 refills | Status: DC

## 2019-04-05 MED ORDER — ALPRAZOLAM 0.25 MG PO TABS: 0.2500 mg | ORAL_TABLET | Freq: Two times a day (BID) | ORAL | 0 refills | Status: DC | PRN

## 2019-04-05 NOTE — Discharge Summary (Signed)
[place] [facility] [place] NAME: Kaitlyn Huff    MR#:  [id number]  DATE OF BIRTH:  10/02/1940  DATE OF ADMISSION:  04/03/2019   ADMITTING PHYSICIAN: Vaughan Basta, MD  DATE OF DISCHARGE: 04/05/2019 11:42 AM  PRIMARY CARE PHYSICIAN: [name], MD   ADMISSION DIAGNOSIS:   Hypokalemia [E87.6] Atrial fibrillation with rapid ventricular response (HCC) [I48.91] Generalized weakness [R53.1]  DISCHARGE DIAGNOSIS:   Active Problems:   Atrial fibrillation with RVR ([place])   SECONDARY DIAGNOSIS:   Past Medical History:  Diagnosis Date  . Anemia   . Cancer ([place])   . Diverticulitis   . GERD (gastroesophageal reflux disease)   . Hyperlipidemia   . Hypertension   . Osteoarthritis   . Osteoporosis    actonel  . Positive PPD    s/p INH (2006)  . Rheumatoid arthritis(714.0)    MTX transaminitis, Leflunomide (rash), enbrel, plaquinil, prednisone, remicade, Imuran (transaminitis)  . Valvular heart disease    moderate MR and TR    HOSPITAL COURSE:   78 year old female with past medical history significant for A. fib on anticoagulation, hypertension, GERD, osteoarthritis, anemia of chronic disease, rheumatoid arthritis [place] to hospital secondary to worsening palpitations and noted to be in A. fib RVR  1.  Atrial fibrillation with rapid ventricular response-anxiety at baseline. - Worsening palpitations for a couple of days -Noted to be in rapid ventricular response on admission. -Started on Cardizem drip on cardizem.  Has been weaned off.   - Restarted oral Cardizem. Also on prn cardizem.  Also added oral metoprolol -Appreciate cardiology consult. -On Eliquis for anticoagulation.  2.  Elevated troponin-secondary to demand ischemia from elevated heart rate.  No acute coronary syndrome.  3.  Arthritis-osteoarthritis and rheumatoid arthritis-continue low-dose prednisone daily.  4.    Anxiety-required a few pills of Xanax  prescription at discharge.  However advised to follow-up with PCP in the long run to see if she can be changed onto any long-acting medication for her anxiety.  Physical therapy consulted-no outpatient PT needs at this time. -Patient will be discharged home with home health nursing  DISCHARGE CONDITIONS:   Guarded  CONSULTS OBTAINED:   Treatment Team:  Corey Skains, MD  DRUG ALLERGIES:   Allergies  Allergen Reactions  . Tramadol Itching  . Astelin [Azelastine Hcl] Other (See Comments)    Reaction:  Unknown   . Codeine Other (See Comments)    Reaction:  Altered mental status  . Dilaudid [Hydromorphone] Other (See Comments)    Reaction:  Unknown   . Flexeril [Cyclobenzaprine] Other (See Comments)    Reaction:  Unknown   . Imuran [Azathioprine] Other (See Comments)    Reaction:  Abnormal liver function  . Lisinopril Itching  . Lyrica [Pregabalin] Other (See Comments)    Reaction:  Sore gums   . Methotrexate Derivatives Other (See Comments)    Reaction:  Abnormal liver function  . Amiodarone Anxiety and Other (See Comments)    Pt could not eat or sleep. Caused n/v and "messed her up"  . Amoxicillin Rash and Other (See Comments)    Unable to obtain enough information to answer additional questions about this medication.    Jolee Ewing [Leflunomide] Rash  . Clindamycin/Lincomycin Rash  . Doxycycline Rash  . Lodine [Etodolac] Rash  . Percocet [Oxycodone-Acetaminophen] Rash  . Sulfa Antibiotics Rash   DISCHARGE MEDICATIONS:   Allergies as of 04/05/2019      Reactions   Tramadol Itching  Astelin [azelastine Hcl] Other (See Comments)   Reaction:  Unknown    Codeine Other (See Comments)   Reaction:  Altered mental status   Dilaudid [hydromorphone] Other (See Comments)   Reaction:  Unknown    Flexeril [cyclobenzaprine] Other (See Comments)   Reaction:  Unknown    Imuran [azathioprine] Other (See Comments)   Reaction:  Abnormal liver function   Lisinopril Itching    Lyrica [pregabalin] Other (See Comments)   Reaction:  Sore gums    Methotrexate Derivatives Other (See Comments)   Reaction:  Abnormal liver function   Amiodarone Anxiety, Other (See Comments)   Pt could not eat or sleep. Caused n/v and "messed her up"   Amoxicillin Rash, Other (See Comments)   Unable to obtain enough information to answer additional questions about this medication.     Arava [leflunomide] Rash   Clindamycin/lincomycin Rash   Doxycycline Rash   Lodine [etodolac] Rash   Percocet [oxycodone-acetaminophen] Rash   Sulfa Antibiotics Rash      Medication List    STOP taking these medications   busPIRone 5 MG tablet Commonly known as: BUSPAR   esomeprazole 20 MG capsule Commonly known as: NEXIUM   guaiFENesin-dextromethorphan 100-10 MG/5ML syrup Commonly known as: ROBITUSSIN DM   ondansetron 4 MG disintegrating tablet Commonly known as: Zofran ODT   Tandem Plus 162-115.2-1 MG Caps   triamcinolone cream 0.1 % Commonly known as: KENALOG     TAKE these medications   acetaminophen 325 MG tablet Commonly known as: TYLENOL Take 2 tablets (650 mg total) by mouth every 6 (six) hours as needed for mild pain (or Fever >/= 101).   albuterol 108 (90 Base) MCG/ACT inhaler Commonly known as: ProAir HFA USE 2 INHALATIONS EVERY 6 HOURS AS NEEDED FOR WHEEZING OR SHORTNESS OF BREATH What changed:   how much to take  how to take this  when to take this  reasons to take this   alendronate 70 MG tablet Commonly known as: FOSAMAX Take 70 mg by mouth every Monday.   ALIGN PO Take 1 tablet by mouth daily.   ALPRAZolam 0.25 MG tablet Commonly known as: XANAX Take 1 tablet (0.25 mg total) by mouth 2 (two) times daily as needed for anxiety. What changed: when to take this   Calcium 600+D 600-400 MG-UNIT tablet Generic drug: Calcium Carbonate-Vitamin D Take 1 tablet by mouth 2 (two) times daily.   cetirizine 10 MG tablet Commonly known as: ZYRTEC TAKE 1 TABLET  DAILY What changed: when to take this   cholecalciferol 1000 units tablet Commonly known as: VITAMIN D Take 1,000 Units by mouth daily.   colestipol 1 g tablet Commonly known as: COLESTID Take 2 g by mouth daily.   diclofenac sodium 1 % Gel Commonly known as: Voltaren Apply 4 g topically 4 (four) times daily for 7 days.   diltiazem 240 MG 24 hr capsule Commonly known as: CARDIZEM CD Take 1 capsule (240 mg total) by mouth daily.   diltiazem 30 MG tablet Commonly known as: CARDIZEM Take 30 mg by mouth 4 (four) times daily as needed (heartrate >100bpm).   Eliquis 5 MG Tabs tablet Generic drug: apixaban Take 5 mg by mouth 2 (two) times daily.   fluticasone 50 MCG/ACT nasal spray Commonly known as: FLONASE Place 2 sprays into both nostrils daily.   furosemide 40 MG tablet Commonly known as: LASIX Take 40 mg by mouth daily.   lip balm Oint Apply 1 application topically as needed for lip care.  meclizine 25 MG tablet Commonly known as: ANTIVERT take 1/2 to 1 tablet every 8 hours if needed for VERTIGO What changed: See the new instructions.   metoprolol succinate 25 MG 24 hr tablet Commonly known as: TOPROL-XL Take 1 tablet (25 mg total) by mouth daily. Start taking on: April 06, 2019   multivitamin with minerals Tabs tablet Take 1 tablet by mouth daily.   potassium chloride 10 MEQ CR capsule Commonly known as: MICRO-K TAKE 2 CAPSULES BY MOUTH EVERY DAY What changed:   how much to take  how to take this  when to take this  additional instructions   predniSONE 5 MG tablet Commonly known as: DELTASONE Take 1 tablet (5 mg total) by mouth daily with breakfast. What changed: Another medication with the same name was removed. Continue taking this medication, and follow the directions you see here.   simvastatin 20 MG tablet Commonly known as: ZOCOR TAKE 1 TABLET DAILY   sodium chloride 0.65 % nasal spray Commonly known as: OCEAN Place 1 spray into the  nose as needed for congestion.        DISCHARGE INSTRUCTIONS:   1. PCP f/u in 1-2 weeks 2. Cardiology f/u in 2 weeks  DIET:   Cardiac diet  ACTIVITY:   Activity as tolerated  OXYGEN:   Home Oxygen: No.  Oxygen Delivery: room air  DISCHARGE LOCATION:   home   If you experience worsening of your admission symptoms, develop shortness of breath, life threatening emergency, suicidal or homicidal thoughts you must seek medical attention immediately by calling 911 or calling your MD immediately  if symptoms less severe.  You Must read complete instructions/literature along with all the possible adverse reactions/side effects for all the Medicines you take and that have been prescribed to you. Take any new Medicines after you have completely understood and accpet all the possible adverse reactions/side effects.   Please note  You were cared for by a hospitalist during your hospital stay. If you have any questions about your discharge medications or the care you received while you were in the hospital after you are discharged, you can call the unit and asked to speak with the hospitalist on call if the hospitalist that took care of you is not available. Once you are discharged, your primary care physician will handle any further medical issues. Please note that NO REFILLS for any discharge medications will be authorized once you are discharged, as it is imperative that you return to your primary care physician (or establish a relationship with a primary care physician if you do not have one) for your aftercare needs so that they can reassess your need for medications and monitor your lab values.    On the day of Discharge:  VITAL SIGNS:   Blood pressure (!) 96/57, pulse 70, temperature 98 F (36.7 C), temperature source Oral, resp. rate 19, height 5\' 5"  (1.651 m), weight 57.8 kg, SpO2 98 %.  PHYSICAL EXAMINATION:    GENERAL:  78 y.o.-year-old patient lying in the bed with no  acute distress.  EYES: Pupils equal, round, reactive to light and accommodation. No scleral icterus. Extraocular muscles intact.  HEENT: Head atraumatic, normocephalic. Oropharynx and nasopharynx clear.  NECK:  Supple, no jugular venous distention. No thyroid enlargement, no tenderness.  LUNGS: Normal breath sounds bilaterally, no wheezing, rales,rhonchi or crepitation. No use of accessory muscles of respiration.  Decreased bibasilar breath sounds CARDIOVASCULAR: S1, S2 normal. No  rubs, or gallops. 3/6 systolic murmur in place  ABDOMEN: Soft, nontender, nondistended. Bowel sounds present. No organomegaly or mass.  EXTREMITIES: No pedal edema, cyanosis, or clubbing.  NEUROLOGIC: Cranial nerves II through XII are intact. Muscle strength 5/5 in all extremities. Sensation intact. Gait not checked.  PSYCHIATRIC: The patient is alert and oriented x 3.  SKIN: No obvious rash, lesion, or ulcer.   DATA REVIEW:   CBC Recent Labs  Lab 04/04/19 0511  WBC 8.8  HGB 10.6*  HCT 31.8*  PLT 274    Chemistries  Recent Labs  Lab 03/29/19 1528  04/04/19 0511 04/05/19 0653  NA 137   < > 137 136  K 3.8   < > 3.8 4.0  CL 99   < > 102 101  CO2 26   < > 26 24  GLUCOSE 124*   < > 109* 102*  BUN 11   < > 16 19  CREATININE 0.96   < > 0.81 0.88  CALCIUM 9.3   < > 8.6* 9.1  MG  --    < > 2.0  --   AST 36  --   --   --   ALT 25  --   --   --   ALKPHOS 48  --   --   --   BILITOT 0.7  --   --   --    < > = values in this interval not displayed.     Microbiology Results  Results for orders placed or performed during the hospital encounter of 04/03/19  SARS CORONAVIRUS 2 (TAT 6-24 HRS) Nasopharyngeal Nasopharyngeal Swab     Status: None   Collection Time: 04/03/19  3:02 PM   Specimen: Nasopharyngeal Swab  Result Value Ref Range Status   SARS Coronavirus 2 NEGATIVE NEGATIVE Final    Comment: (NOTE) SARS-CoV-2 target nucleic acids are NOT DETECTED. The SARS-CoV-2 RNA is generally detectable in  upper and lower respiratory specimens during the acute phase of infection. Negative results do not preclude SARS-CoV-2 infection, do not rule out co-infections with other pathogens, and should not be used as the sole basis for treatment or other patient management decisions. Negative results must be combined with clinical observations, patient history, and epidemiological information. The expected result is Negative. Fact Sheet for Patients: SugarRoll.be Fact Sheet for Healthcare Providers: https://www.woods-mathews.com/ This test is not yet approved or cleared by the [place] FDA and  has been authorized for detection and/or diagnosis of SARS-CoV-2 by FDA under an Emergency Use Authorization (EUA). This EUA will remain  in effect (meaning this test can be used) for the duration of the COVID-19 declaration under Section 56 4(b)(1) of the Act, 21 U.S.C. section 360bbb-3(b)(1), unless the authorization is terminated or revoked sooner. Performed at [facility], [place] [address] [place], [place] [address]     RADIOLOGY:  No results found.   Management plans discussed with the patient, family and they are in agreement.  CODE STATUS:     Code Status Orders  (From admission, onward)         Start     Ordered   04/03/19 1846  Full code  Continuous     04/03/19 1845        Code Status History    Date Active Date Inactive Code Status Order ID Comments User Context   02/27/2019 0620 03/03/2019 1549 Full Code [id number]  Sidney Ace Arvella Merles, MD ED   06/24/2017 0655 06/26/2017 1640 Full Code [id number]  Harrie Foreman, MD Inpatient  12/22/2016 1725 12/23/2016 1640 Partial Code [id number]  Hillary Bow, MD ED   12/22/2016 1710 12/22/2016 1725 Full Code [id number]  Hillary Bow, MD ED   10/31/2016 1757 11/02/2016 1519 Full Code [id number]  Demetrios Loll, MD Inpatient   06/19/2016 1320 06/21/2016 1529 DNR [id number]  Loletha Grayer, MD  Inpatient   06/17/2016 0201 06/19/2016 1320 Full Code [id number]  Saundra Shelling, MD ED   08/18/2015 1628 08/22/2015 1615 Full Code [id number]  Hower, Aaron Mose, MD ED   08/03/2015 0949 08/07/2015 1411 Full Code [id number]  Saundra Shelling, MD Inpatient   Advance Care Planning Activity      TOTAL TIME TAKING CARE OF THIS PATIENT: 38 minutes.    Gladstone Lighter M.D on 04/05/2019 at 1:56 PM  Between 7am to 6pm - Pager - [phone]  After 6pm go to [email or web address] - Proofreader  [facility]  Office  [phone]  CC: Primary care physician; [name], MD   Note: This dictation was prepared with Dragon dictation along with smaller phrase technology. Any transcriptional errors that result from this process are unintentional.

## 2019-04-05 NOTE — Progress Notes (Signed)
   04/05/19 1100  Clinical Encounter Type  Visited With Patient  Visit Type Initial  Referral From Patient  Ch was requested by page. Pt wanted to change her DNR status to Full Code and in case of emergency receive CPR only for 15 minutes. Ch completed the goals of care form for the pt with the information and also informed the nurse.

## 2019-04-05 NOTE — Progress Notes (Signed)
[facility] Cardiology [facility] Encounter Note  Patient: Kaitlyn Huff / Admit Date: 04/03/2019 / Date of Encounter: 04/05/2019, 8:41 AM   Subjective: Patient feeling well today.  No evidence of chest pain shortness of breath edema weakness or syncope.  Heart rate on telemetry well controlled at 72 bpm with minimal ambulation.  No evidence of change in troponin or congestive heart failure symptoms  Review of Systems: Positive for: None Negative for: Vision change, hearing change, syncope, dizziness, nausea, vomiting,diarrhea, bloody stool, stomach pain, cough, congestion, diaphoresis, urinary frequency, urinary pain,skin lesions, skin rashes Others previously listed  Objective: Telemetry: Atrial fibrillation with controlled ventricular rate Physical Exam: Blood pressure (!) 96/57, pulse 70, temperature 98 F (36.7 C), temperature source Oral, resp. rate 19, height 5\' 5"  (1.651 m), weight 57.8 kg, SpO2 98 %. Body mass index is 21.2 kg/m. General: Well developed, well nourished, in no acute distress. Head: Normocephalic, atraumatic, sclera non-icteric, no xanthomas, nares are without discharge. Neck: No apparent masses Lungs: Normal respirations with no wheezes, no rhonchi, no rales , no crackles   Heart: Irregular rate and rhythm, normal S1 S2, no murmur, no rub, no gallop, PMI is normal size and placement, carotid upstroke normal without bruit, jugular venous pressure normal Abdomen: Soft, non-tender, non-distended with normoactive bowel sounds. No hepatosplenomegaly. Abdominal aorta is normal size without bruit Extremities: No edema, no clubbing, no cyanosis, no ulcers,  Peripheral: 2+ radial, 2+ femoral, 2+ dorsal pedal pulses Neuro: Alert and oriented. Moves all extremities spontaneously. Psych:  Responds to questions appropriately with a normal affect.   Intake/Output Summary (Last 24 hours) at 04/05/2019 0841 Last data filed at 04/05/2019 0500 Gross per 24 hour  Intake 383.65  ml  Output 150 ml  Net 233.65 ml    Inpatient Medications:  . apixaban  5 mg Oral BID  . calcium-vitamin D  1 tablet Oral BID  . cholecalciferol  1,000 Units Oral Daily  . colestipol  2 g Oral Daily  . diltiazem  240 mg Oral Daily  . fluticasone  2 spray Each Nare Daily  . influenza vaccine adjuvanted  0.5 mL Intramuscular Tomorrow-1000  . loratadine  10 mg Oral Daily  . metoprolol succinate  25 mg Oral Daily  . multivitamin with minerals  1 tablet Oral Daily  . pantoprazole  40 mg Oral Daily  . predniSONE  5 mg Oral Q breakfast  . simvastatin  20 mg Oral Daily   Infusions:  . sodium chloride Stopped (04/04/19 1040)    Labs: Recent Labs    04/03/19 1246 04/04/19 0511 04/05/19 0653  NA 133* 137 136  K 2.9* 3.8 4.0  CL 94* 102 101  CO2 25 26 24   GLUCOSE 106* 109* 102*  BUN 14 16 19   CREATININE 0.89 0.81 0.88  CALCIUM 9.0 8.6* 9.1  MG 1.9 2.0  --    No results for input(s): AST, ALT, ALKPHOS, BILITOT, PROT, ALBUMIN in the last 72 hours. Recent Labs    04/03/19 1246 04/04/19 0511  WBC 11.2* 8.8  HGB 11.4* 10.6*  HCT 33.8* 31.8*  MCV 89.7 90.1  PLT 292 274   No results for input(s): CKTOTAL, CKMB, TROPONINI in the last 72 hours. Invalid input(s): POCBNP No results for input(s): HGBA1C in the last 72 hours.   Weights: Filed Weights   04/03/19 1231 04/03/19 1846 04/04/19 0414  Weight: 59 kg 57.3 kg 57.8 kg     Radiology/Studies:  Dg Chest 2 View  Result Date: 04/03/2019 CLINICAL DATA:  Atrial fibrillation. EXAM: CHEST - 2 VIEW COMPARISON:  Chest x-ray 02/27/2019 FINDINGS: The cardiac silhouette, mediastinal and hilar contours are within normal limits and stable. The lungs are clear. Mild eventration of the right hemidiaphragm is stable. No worrisome pulmonary lesions. No pleural effusions. The bony thorax is intact. IMPRESSION: No acute cardiopulmonary findings. Electronically Signed   By: Marijo Sanes M.D.   On: 04/03/2019 13:16     Assessment and  Recommendation  78 y.o. female with hypertension hyperlipidemia and chronic nonvalvular atrial fibrillation with rapid ventricular rate multifactorial in nature without evidence of myocardial infarction or congestive heart failure now well controlled 1.  Continue current medical regimen of combination of metoprolol diltiazem for heart rate control of atrial fibrillation 2.  Begin ambulation and follow for improvements of symptoms 3.  Continue anticoagulation for further risk reduction in stroke with atrial fibrillation 4.  If ambulating well with no further significant symptoms okay for discharged home with follow-up next week for further adjustments of medication   Signed, Serafina Royals M.D. FACC

## 2019-04-05 NOTE — Plan of Care (Signed)
  Problem: Cardiac: Goal: Ability to achieve and maintain adequate cardiopulmonary perfusion will improve Outcome: Progressing   Problem: Education: Goal: Knowledge of General Education information will improve Description: Including pain rating scale, medication(s)/side effects and non-pharmacologic comfort measures Outcome: Progressing   

## 2019-04-08 ENCOUNTER — Telehealth: Payer: Self-pay

## 2019-04-08 NOTE — Telephone Encounter (Signed)
Transition Care Management Follow-up Telephone Call   Date discharged? 04/05/19   How have you been since you were released from the hospital? Patient states she is monitoring her vital signs and they within normal limits. Denies chest pain, dizziness, nausea. Taking xanax bid, prn.   Do you understand why you were in the hospital? Yes, Afib.   Do you understand the discharge instructions? Yes, increase activity as tolerated, keep moving.   Where were you discharged to? Home   Items Reviewed:  Medications reviewed: Taking medications as directed. Daughter assists with medication management.   Allergies reviewed: Yes, none new  Dietary changes reviewed: Heart healthy  Referrals reviewed: Yes   Functional Questionnaire:   Activities of Daily Living (ADLs):   She states they are independent in the following: Independent in all ADLs.  States they require assistance with the following: She does not require assistance at this time.    Any transportation issues/concerns?: None.   Any patient concerns? None at this time.    Confirmed importance and date/time of follow-up visits scheduled Yes, appointment scheduled 04/12/19 @ 10:00, [phone].  Provider Appointment booked with Dr. Nicki Reaper, pcp.  Confirmed with patient if condition begins to worsen call PCP or go to the ER.  Patient was given the office number and encouraged to call back with question or concerns.  : Yes

## 2019-04-08 NOTE — Telephone Encounter (Signed)
Ok to schedule a doxy visit if pt prefers.

## 2019-04-09 DIAGNOSIS — M069 Rheumatoid arthritis, unspecified: Secondary | ICD-10-CM | POA: Diagnosis not present

## 2019-04-09 DIAGNOSIS — M25462 Effusion, left knee: Secondary | ICD-10-CM | POA: Diagnosis not present

## 2019-04-09 DIAGNOSIS — F4323 Adjustment disorder with mixed anxiety and depressed mood: Secondary | ICD-10-CM | POA: Diagnosis not present

## 2019-04-09 DIAGNOSIS — I081 Rheumatic disorders of both mitral and tricuspid valves: Secondary | ICD-10-CM | POA: Diagnosis not present

## 2019-04-09 DIAGNOSIS — M1990 Unspecified osteoarthritis, unspecified site: Secondary | ICD-10-CM | POA: Diagnosis not present

## 2019-04-09 DIAGNOSIS — I4891 Unspecified atrial fibrillation: Secondary | ICD-10-CM | POA: Diagnosis not present

## 2019-04-09 DIAGNOSIS — I10 Essential (primary) hypertension: Secondary | ICD-10-CM | POA: Diagnosis not present

## 2019-04-09 DIAGNOSIS — F5105 Insomnia due to other mental disorder: Secondary | ICD-10-CM | POA: Diagnosis not present

## 2019-04-12 ENCOUNTER — Ambulatory Visit (INDEPENDENT_AMBULATORY_CARE_PROVIDER_SITE_OTHER): Payer: Medicare Other | Admitting: Internal Medicine

## 2019-04-12 ENCOUNTER — Encounter: Payer: Self-pay | Admitting: Internal Medicine

## 2019-04-12 DIAGNOSIS — I4891 Unspecified atrial fibrillation: Secondary | ICD-10-CM

## 2019-04-12 DIAGNOSIS — F419 Anxiety disorder, unspecified: Secondary | ICD-10-CM | POA: Diagnosis not present

## 2019-04-12 DIAGNOSIS — E7800 Pure hypercholesterolemia, unspecified: Secondary | ICD-10-CM

## 2019-04-12 DIAGNOSIS — D649 Anemia, unspecified: Secondary | ICD-10-CM

## 2019-04-12 DIAGNOSIS — K760 Fatty (change of) liver, not elsewhere classified: Secondary | ICD-10-CM | POA: Diagnosis not present

## 2019-04-12 DIAGNOSIS — M069 Rheumatoid arthritis, unspecified: Secondary | ICD-10-CM | POA: Diagnosis not present

## 2019-04-12 DIAGNOSIS — K219 Gastro-esophageal reflux disease without esophagitis: Secondary | ICD-10-CM | POA: Diagnosis not present

## 2019-04-12 DIAGNOSIS — I10 Essential (primary) hypertension: Secondary | ICD-10-CM | POA: Diagnosis not present

## 2019-04-12 NOTE — Progress Notes (Signed)
Patient ID: Kaitlyn Huff, female   DOB: 06-08-1941, 78 y.o.   MRN: [id number]   Virtual Visit via video Note  This visit type was conducted due to national recommendations for restrictions regarding the COVID-19 pandemic (e.g. social distancing).  This format is felt to be most appropriate for this patient at this time.  All issues noted in this document were discussed and addressed.  No physical exam was performed (except for noted visual exam findings with Video Visits).   I connected with Kaitlyn Huff by a video enabled telemedicine application and verified that I am speaking with the correct person using two identifiers. Location patient: home Location provider: work Persons participating in the virtual visit: patient, provider  I discussed the limitations, risks, security and privacy concerns of performing an evaluation and management service by video and the availability of in person appointments.  The patient expressed understanding and agreed to proceed.   Reason for visit: hospital follow up.   HPI: Was admitted 04/03/19 - 04/05/19 with afib - RVR.  Started on cardizem drip and then changed to cardizem (oral).  Had low dose cardizem to take prn.  On eliquis for anticoagulation.  Since her discharge, she feels better.  No significant increased heart rate or palpitations.  No chest pain.  Breathing stable.  No acid reflux.  No abdominal pain.  Bowels moving.  She did require xanax in the hospital.  I had discussed with her prior to her hospitalization and today, that we need to taper her off the xanax. She is seeing psychiatry.  Has f/u appt tomorrow.  Will discuss with them regarding other options to help control her anxiety.  Eating.  No nausea or vomiting.     ROS: See pertinent positives and negatives per HPI.  Past Medical History:  Diagnosis Date  . Anemia   . Cancer ([place])   . Diverticulitis   . GERD (gastroesophageal reflux disease)   . Hyperlipidemia   . Hypertension   .  Osteoarthritis   . Osteoporosis    actonel  . Positive PPD    s/p INH (2006)  . Rheumatoid arthritis(714.0)    MTX transaminitis, Leflunomide (rash), enbrel, plaquinil, prednisone, remicade, Imuran (transaminitis)  . Valvular heart disease    moderate MR and TR    Past Surgical History:  Procedure Laterality Date  . ABDOMINAL HYSTERECTOMY    . CERVICAL CONE BIOPSY    . CHOLECYSTECTOMY  06/22/14  . COLONOSCOPY WITH PROPOFOL N/A 07/09/2015   Procedure: COLONOSCOPY WITH PROPOFOL;  Surgeon: Manya Silvas, MD;  Location: [facility] ENDOSCOPY;  Service: Endoscopy;  Laterality: N/A;  . ESOPHAGOGASTRODUODENOSCOPY (EGD) WITH PROPOFOL N/A 07/09/2015   Procedure: ESOPHAGOGASTRODUODENOSCOPY (EGD) WITH PROPOFOL;  Surgeon: Manya Silvas, MD;  Location: [facility] ENDOSCOPY;  Service: Endoscopy;  Laterality: N/A;  . OVARY SURGERY    . TRACHEOSTOMY  1959  . TUBAL LIGATION      Family History  Problem Relation Age of Onset  . Heart disease Father        MI  . Heart disease Mother   . Valvular heart disease Mother   . Breast cancer Sister 5  . Cancer Sister        Lung cancer  . COPD Sister   . Heart disease Sister   . Colon cancer Neg Hx     SOCIAL HX: reviewed.    Current Outpatient Medications:  .  alendronate (FOSAMAX) 70 MG tablet, Take 70 mg by mouth every Monday. , Disp: ,  Rfl:  .  ALPRAZolam (XANAX) 0.25 MG tablet, Take 1 tablet (0.25 mg total) by mouth 2 (two) times daily as needed for anxiety., Disp: 20 tablet, Rfl: 0 .  apixaban (ELIQUIS) 5 MG TABS tablet, Take 5 mg by mouth 2 (two) times daily., Disp: , Rfl:  .  Calcium Carbonate-Vitamin D (CALCIUM 600+D) 600-400 MG-UNIT tablet, Take 1 tablet by mouth 2 (two) times daily. , Disp: , Rfl:  .  cetirizine (ZYRTEC) 10 MG tablet, TAKE 1 TABLET DAILY (Patient taking differently: Take 10 mg by mouth at bedtime. ), Disp: 90 tablet, Rfl: 4 .  cholecalciferol (VITAMIN D) 1000 units tablet, Take 1,000 Units by mouth daily., Disp: , Rfl:  .   colestipol (COLESTID) 1 g tablet, Take 2 g by mouth daily. , Disp: , Rfl:  .  diltiazem (CARDIZEM CD) 240 MG 24 hr capsule, Take 1 capsule (240 mg total) by mouth daily., Disp: 30 capsule, Rfl: 0 .  diltiazem (CARDIZEM) 30 MG tablet, Take 30 mg by mouth 4 (four) times daily as needed (heartrate >100bpm). , Disp: , Rfl:  .  fluticasone (FLONASE) 50 MCG/ACT nasal spray, Place 2 sprays into both nostrils daily., Disp: 48 g, Rfl: 1 .  furosemide (LASIX) 40 MG tablet, Take 40 mg by mouth daily. , Disp: , Rfl:  .  lip balm (BLISTEX) OINT, Apply 1 application topically as needed for lip care., Disp: 1 Tube, Rfl: 0 .  meclizine (ANTIVERT) 25 MG tablet, take 1/2 to 1 tablet every 8 hours if needed for VERTIGO (Patient taking differently: Take 12.5-25 mg by mouth 3 (three) times daily as needed for dizziness. ), Disp: 40 tablet, Rfl: 3 .  metoprolol succinate (TOPROL-XL) 25 MG 24 hr tablet, Take 1 tablet (25 mg total) by mouth daily., Disp: 30 tablet, Rfl: 2 .  Multiple Vitamin (MULTIVITAMIN WITH MINERALS) TABS tablet, Take 1 tablet by mouth daily., Disp: , Rfl:  .  potassium chloride (MICRO-K) 10 MEQ CR capsule, TAKE 2 CAPSULES BY MOUTH EVERY DAY (Patient taking differently: Take 20 mEq by mouth daily. ), Disp: 180 capsule, Rfl: 1 .  predniSONE (DELTASONE) 5 MG tablet, Take 1 tablet (5 mg total) by mouth daily with breakfast., Disp: , Rfl:  .  Probiotic Product (ALIGN PO), Take 1 tablet by mouth daily., Disp: , Rfl:  .  simvastatin (ZOCOR) 20 MG tablet, TAKE 1 TABLET DAILY (Patient taking differently: Take 20 mg by mouth daily. ), Disp: 90 tablet, Rfl: 4 .  sodium chloride (OCEAN) 0.65 % nasal spray, Place 1 spray into the nose as needed for congestion., Disp: , Rfl:  .  acetaminophen (TYLENOL) 325 MG tablet, Take 2 tablets (650 mg total) by mouth every 6 (six) hours as needed for mild pain (or Fever >/= 101). (Patient not taking: Reported on 04/12/2019), Disp: , Rfl:  .  albuterol (PROAIR HFA) 108 (90 Base)  MCG/ACT inhaler, USE 2 INHALATIONS EVERY 6 HOURS AS NEEDED FOR WHEEZING OR SHORTNESS OF BREATH (Patient not taking: Reported on 04/12/2019), Disp: 51 g, Rfl: 3  EXAM:  GENERAL: alert, oriented, appears well and in no acute distress  HEENT: atraumatic, conjunttiva clear, no obvious abnormalities on inspection of external nose and ears  NECK: normal movements of the head and neck  LUNGS: on inspection no signs of respiratory distress, breathing rate appears normal, no obvious gross SOB, gasping or wheezing  CV: no obvious cyanosis  PSYCH/NEURO: pleasant and cooperative, no obvious depression or anxiety, speech and thought processing grossly intact  ASSESSMENT AND PLAN:  Discussed the following assessment and plan:  A-fib [facility]) Recent admission for afib with RVR.  On oral cardizem.  Has prn cardizem to take prn.  Followed by cardiology.  Has f/u 04/15/19.  On eliquis.  Overall doing better.  Follow.    Anemia Follow cbc and ferritin.    Anxiety Discussed with her today.  She is due to f/u with psychiatry tomorrow.  Discussed with her regarding my desire to decrease and taper her xanax.  Will discuss with psychiatry tomorrow regarding other treatment options.    Fatty liver Seeing GI.  Follow liver function tests.   GERD (gastroesophageal reflux disease) Controlled on current regimen.    Hypercholesterolemia On simvastatin.  Low cholesterol diet and exercise. Follow lipid panel and liver function tests.    Hypertension Blood pressure under good control.  Continue same medication regimen.  Follow pressures.  Follow metabolic panel.    Rheumatoid arthritis Followed by Dr [name].      I discussed the assessment and treatment plan with the patient. The patient was provided an opportunity to ask questions and all were answered. The patient agreed with the plan and demonstrated an understanding of the instructions.   The patient was advised to call back or seek an in-person  evaluation if the symptoms worsen or if the condition fails to improve as anticipated.   [name], MD

## 2019-04-14 ENCOUNTER — Encounter: Payer: Self-pay | Admitting: Internal Medicine

## 2019-04-14 NOTE — Assessment & Plan Note (Signed)
Blood pressure under good control.  Continue same medication regimen.  Follow pressures.  Follow metabolic panel.   

## 2019-04-14 NOTE — Assessment & Plan Note (Signed)
Recent admission for afib with RVR.  On oral cardizem.  Has prn cardizem to take prn.  Followed by cardiology.  Has f/u 04/15/19.  On eliquis.  Overall doing better.  Follow.

## 2019-04-14 NOTE — Assessment & Plan Note (Signed)
Followed by Dr Kernodle.   

## 2019-04-14 NOTE — Assessment & Plan Note (Signed)
Seeing GI.  Follow liver function tests.

## 2019-04-14 NOTE — Assessment & Plan Note (Signed)
Discussed with her today.  She is due to f/u with psychiatry tomorrow.  Discussed with her regarding my desire to decrease and taper her xanax.  Will discuss with psychiatry tomorrow regarding other treatment options.

## 2019-04-14 NOTE — Assessment & Plan Note (Signed)
Follow cbc and ferritin.  

## 2019-04-14 NOTE — Assessment & Plan Note (Signed)
On simvastatin.  Low cholesterol diet and exercise.  Follow lipid panel and liver function tests.   

## 2019-04-14 NOTE — Assessment & Plan Note (Signed)
Controlled on current regimen.   

## 2019-04-17 DIAGNOSIS — E782 Mixed hyperlipidemia: Secondary | ICD-10-CM | POA: Diagnosis not present

## 2019-04-17 DIAGNOSIS — I10 Essential (primary) hypertension: Secondary | ICD-10-CM | POA: Diagnosis not present

## 2019-04-17 DIAGNOSIS — I38 Endocarditis, valve unspecified: Secondary | ICD-10-CM | POA: Diagnosis not present

## 2019-04-17 DIAGNOSIS — I480 Paroxysmal atrial fibrillation: Secondary | ICD-10-CM | POA: Diagnosis not present

## 2019-04-17 DIAGNOSIS — G4733 Obstructive sleep apnea (adult) (pediatric): Secondary | ICD-10-CM | POA: Diagnosis not present

## 2019-04-17 DIAGNOSIS — J441 Chronic obstructive pulmonary disease with (acute) exacerbation: Secondary | ICD-10-CM | POA: Diagnosis not present

## 2019-05-01 ENCOUNTER — Other Ambulatory Visit: Payer: Self-pay

## 2019-05-01 ENCOUNTER — Other Ambulatory Visit (INDEPENDENT_AMBULATORY_CARE_PROVIDER_SITE_OTHER): Payer: Medicare Other

## 2019-05-01 DIAGNOSIS — E7800 Pure hypercholesterolemia, unspecified: Secondary | ICD-10-CM | POA: Diagnosis not present

## 2019-05-01 DIAGNOSIS — D649 Anemia, unspecified: Secondary | ICD-10-CM | POA: Diagnosis not present

## 2019-05-01 DIAGNOSIS — I10 Essential (primary) hypertension: Secondary | ICD-10-CM | POA: Diagnosis not present

## 2019-05-01 LAB — IBC + FERRITIN
Ferritin: 61.9 ng/mL (ref 10.0–291.0)
Iron: 42 ug/dL (ref 42–145)
Saturation Ratios: 10.5 % — ABNORMAL LOW (ref 20.0–50.0)
Transferrin: 285 mg/dL (ref 212.0–360.0)

## 2019-05-01 LAB — CBC WITH DIFFERENTIAL/PLATELET
Basophils Absolute: 0 10*3/uL (ref 0.0–0.1)
Basophils Relative: 0.3 % (ref 0.0–3.0)
Eosinophils Absolute: 0 10*3/uL (ref 0.0–0.7)
Eosinophils Relative: 0.4 % (ref 0.0–5.0)
HCT: 33.4 % — ABNORMAL LOW (ref 36.0–46.0)
Hemoglobin: 11.3 g/dL — ABNORMAL LOW (ref 12.0–15.0)
Lymphocytes Relative: 11.6 % — ABNORMAL LOW (ref 12.0–46.0)
Lymphs Abs: 1.1 10*3/uL (ref 0.7–4.0)
MCHC: 34 g/dL (ref 30.0–36.0)
MCV: 88.4 fl (ref 78.0–100.0)
Monocytes Absolute: 0.5 10*3/uL (ref 0.1–1.0)
Monocytes Relative: 5.2 % (ref 3.0–12.0)
Neutro Abs: 7.6 10*3/uL (ref 1.4–7.7)
Neutrophils Relative %: 82.5 % — ABNORMAL HIGH (ref 43.0–77.0)
Platelets: 263 10*3/uL (ref 150.0–400.0)
RBC: 3.78 Mil/uL — ABNORMAL LOW (ref 3.87–5.11)
RDW: 13.4 % (ref 11.5–15.5)
WBC: 9.3 10*3/uL (ref 4.0–10.5)

## 2019-05-01 LAB — LIPID PANEL
Cholesterol: 137 mg/dL (ref 0–200)
HDL: 68 mg/dL (ref >39.00)
LDL Cholesterol: 55 mg/dL (ref 0–99)
NonHDL: 69.11
Total CHOL/HDL Ratio: 2
Triglycerides: 69 mg/dL (ref 0.0–149.0)
VLDL: 13.8 mg/dL (ref 0.0–40.0)

## 2019-05-01 LAB — BASIC METABOLIC PANEL
BUN: 11 mg/dL (ref 6–23)
CO2: 30 mEq/L (ref 19–32)
Calcium: 9.4 mg/dL (ref 8.4–10.5)
Chloride: 97 mEq/L (ref 96–112)
Creatinine, Ser: 0.97 mg/dL (ref 0.40–1.20)
GFR: 55.83 mL/min — ABNORMAL LOW (ref >60.00)
Glucose, Bld: 130 mg/dL — ABNORMAL HIGH (ref 70–99)
Potassium: 3.9 mEq/L (ref 3.5–5.1)
Sodium: 135 mEq/L (ref 135–145)

## 2019-05-01 LAB — HEPATIC FUNCTION PANEL
ALT: 17 U/L (ref 0–35)
AST: 28 U/L (ref 0–37)
Albumin: 4.1 g/dL (ref 3.5–5.2)
Alkaline Phosphatase: 55 U/L (ref 39–117)
Bilirubin, Direct: 0.1 mg/dL (ref 0.0–0.3)
Total Bilirubin: 0.5 mg/dL (ref 0.2–1.2)
Total Protein: 7.5 g/dL (ref 6.0–8.3)

## 2019-05-01 LAB — TSH: TSH: 2.02 u[IU]/mL (ref 0.35–4.50)

## 2019-05-02 ENCOUNTER — Telehealth: Payer: Self-pay

## 2019-05-02 NOTE — Telephone Encounter (Signed)
Called and spoke to patient.  Patient said that her right arm is swollen and her right knee and right foot as well.  Patient c/o of not being able to raise her arm since she had blood drawn from that arm on yesterday.  Patient believes that swelling from leg and foot could be coming from her RA since she has not been able to get infusions in a while.  Patient said that it has been a long time since she's had an infusion but could not remember the last time.  No other symptoms.  Patient said that she called EMS early this morning because someone told her that blood clots could be causing her swollen arm.  Patient said that EMS came out and checked her arm and advised that they didn't think it was blood clot and advised her not to go to hospital with everything that was going on with COVID.  Patient said that she is taking lasix 40 mg and 5mg  of prednisone daily but said that she is not taking the prednisone taper.  Please advise.

## 2019-05-02 NOTE — Telephone Encounter (Signed)
Copied from [place] [id number]. Topic: General - Other >> May 02, 2019 10:15 AM Ivar Drape wrote: Reason for CRM:  Patient had lab work done yesterday and she is complaining of a very sore arm.  She has been in pain all night and she can barely move her arm.

## 2019-05-02 NOTE — Telephone Encounter (Signed)
Advised patient of message below. Stated that she would try to go this afternoon. Advised that she needed to go today per Dr Nicki Reaper. Pt gave verbal understanding. Advised to let us know if she needs anything

## 2019-05-02 NOTE — Telephone Encounter (Signed)
If unable to raise arm and this is new and if swelling, I do feel she needs to be evaluated today.  To  Acute care for evaluation.

## 2019-05-04 ENCOUNTER — Other Ambulatory Visit: Payer: Self-pay | Admitting: Internal Medicine

## 2019-05-04 DIAGNOSIS — R739 Hyperglycemia, unspecified: Secondary | ICD-10-CM

## 2019-05-04 NOTE — Progress Notes (Signed)
Order placed for f/u lab.   

## 2019-05-08 DIAGNOSIS — M05731 Rheumatoid arthritis with rheumatoid factor of right wrist without organ or systems involvement: Secondary | ICD-10-CM | POA: Diagnosis not present

## 2019-05-08 DIAGNOSIS — M05732 Rheumatoid arthritis with rheumatoid factor of left wrist without organ or systems involvement: Secondary | ICD-10-CM | POA: Diagnosis not present

## 2019-05-08 DIAGNOSIS — M0579 Rheumatoid arthritis with rheumatoid factor of multiple sites without organ or systems involvement: Secondary | ICD-10-CM | POA: Diagnosis not present

## 2019-05-21 DIAGNOSIS — H43813 Vitreous degeneration, bilateral: Secondary | ICD-10-CM | POA: Diagnosis not present

## 2019-05-21 DIAGNOSIS — Z79899 Other long term (current) drug therapy: Secondary | ICD-10-CM | POA: Diagnosis not present

## 2019-05-31 ENCOUNTER — Other Ambulatory Visit: Payer: Self-pay

## 2019-06-04 ENCOUNTER — Other Ambulatory Visit: Payer: Medicare Other

## 2019-06-10 ENCOUNTER — Other Ambulatory Visit: Payer: Self-pay

## 2019-06-10 ENCOUNTER — Other Ambulatory Visit (INDEPENDENT_AMBULATORY_CARE_PROVIDER_SITE_OTHER): Payer: Medicare Other

## 2019-06-10 ENCOUNTER — Telehealth: Payer: Self-pay | Admitting: *Deleted

## 2019-06-10 DIAGNOSIS — R739 Hyperglycemia, unspecified: Secondary | ICD-10-CM | POA: Diagnosis not present

## 2019-06-10 NOTE — Telephone Encounter (Signed)
Copied from [place] [phone]. Topic: General - Inquiry >> Jun 10, 2019 11:13 AM Alease Frame wrote: Reason for [id number] took blood sugar medication and wanted to know if it would interfere with blood work today . Please advise

## 2019-06-10 NOTE — Telephone Encounter (Signed)
Called and spoke to patient.  Advised patient that she could take her medications as she normally would unless she had been advised otherwise from MD.  Pt said that she had already spoken to someone from the office who told her it would be ok.

## 2019-06-11 LAB — HEMOGLOBIN A1C: Hgb A1c MFr Bld: 6.1 % (ref 4.6–6.5)

## 2019-06-11 LAB — GLUCOSE, FASTING: Glucose, Plasma: 117 mg/dL — ABNORMAL HIGH (ref 65–99)

## 2019-06-18 ENCOUNTER — Ambulatory Visit (INDEPENDENT_AMBULATORY_CARE_PROVIDER_SITE_OTHER): Payer: Medicare Other | Admitting: Internal Medicine

## 2019-06-18 ENCOUNTER — Other Ambulatory Visit: Payer: Self-pay

## 2019-06-18 VITALS — BP 130/64 | Ht 65.0 in | Wt 133.0 lb

## 2019-06-18 DIAGNOSIS — M069 Rheumatoid arthritis, unspecified: Secondary | ICD-10-CM | POA: Diagnosis not present

## 2019-06-18 DIAGNOSIS — K760 Fatty (change of) liver, not elsewhere classified: Secondary | ICD-10-CM

## 2019-06-18 DIAGNOSIS — I10 Essential (primary) hypertension: Secondary | ICD-10-CM

## 2019-06-18 DIAGNOSIS — R42 Dizziness and giddiness: Secondary | ICD-10-CM | POA: Diagnosis not present

## 2019-06-18 DIAGNOSIS — E7800 Pure hypercholesterolemia, unspecified: Secondary | ICD-10-CM | POA: Diagnosis not present

## 2019-06-18 DIAGNOSIS — F419 Anxiety disorder, unspecified: Secondary | ICD-10-CM

## 2019-06-18 DIAGNOSIS — K219 Gastro-esophageal reflux disease without esophagitis: Secondary | ICD-10-CM | POA: Diagnosis not present

## 2019-06-18 DIAGNOSIS — I4891 Unspecified atrial fibrillation: Secondary | ICD-10-CM

## 2019-06-18 DIAGNOSIS — R739 Hyperglycemia, unspecified: Secondary | ICD-10-CM | POA: Diagnosis not present

## 2019-06-18 DIAGNOSIS — D649 Anemia, unspecified: Secondary | ICD-10-CM

## 2019-06-18 MED ORDER — POTASSIUM CHLORIDE ER 10 MEQ PO CPCR: ORAL_CAPSULE | ORAL | 1 refills | Status: DC

## 2019-06-18 NOTE — Progress Notes (Signed)
Patient ID: Kaitlyn Huff, female   DOB: 03/31/1941, 78 y.o.   MRN: [id number]   Virtual Visit via telephone Note  This visit type was conducted due to national recommendations for restrictions regarding the COVID-19 pandemic (e.g. social distancing).  This format is felt to be most appropriate for this patient at this time.  All issues noted in this document were discussed and addressed.  No physical exam was performed (except for noted visual exam findings with Video Visits).   I connected with Kaitlyn Huff by telephone and verified that I am speaking with the correct person using two identifiers. Location patient: home Location provider: work  Persons participating in the telephone visit: patient, provider  I discussed the limitations, risks, security and privacy concerns of performing an evaluation and management service by telephone and the availability of in person appointments.  The patient expressed understanding and agreed to proceed.   Reason for visit: scheduled follow up.   HPI: She was recently admitted to [facility] on 03/2019 - afib with RVR.  Followed by cardiology.  States did not tolerate 240mg  of cardizem.  Now on 180mg .  Dizziness resolved.  No headache.  No chest pain.  Breathing stable.  No increased cough or congestion.  No acid reflux.  No abdominal pain.  Bowels moving.  Recently on prednisone taper.  Followed by Dr [name] - for RA. History of increased anxiety.  I had referred her to Dr Nicolasa Ducking.  She desires not to follow up with Dr Nicolasa Ducking.  Her xanax is being refilled by Dr Clayborn Bigness.  Overall feels better.     ROS: See pertinent positives and negatives per HPI.  Past Medical History:  Diagnosis Date  . Anemia   . Cancer ([place])   . Diverticulitis   . GERD (gastroesophageal reflux disease)   . Hyperlipidemia   . Hypertension   . Osteoarthritis   . Osteoporosis    actonel  . Positive PPD    s/p INH (2006)  . Rheumatoid arthritis(714.0)    MTX transaminitis,  Leflunomide (rash), enbrel, plaquinil, prednisone, remicade, Imuran (transaminitis)  . Valvular heart disease    moderate MR and TR    Past Surgical History:  Procedure Laterality Date  . ABDOMINAL HYSTERECTOMY    . CERVICAL CONE BIOPSY    . CHOLECYSTECTOMY  06/22/14  . COLONOSCOPY WITH PROPOFOL N/A 07/09/2015   Procedure: COLONOSCOPY WITH PROPOFOL;  Surgeon: Manya Silvas, MD;  Location: [facility] ENDOSCOPY;  Service: Endoscopy;  Laterality: N/A;  . ESOPHAGOGASTRODUODENOSCOPY (EGD) WITH PROPOFOL N/A 07/09/2015   Procedure: ESOPHAGOGASTRODUODENOSCOPY (EGD) WITH PROPOFOL;  Surgeon: Manya Silvas, MD;  Location: [facility] ENDOSCOPY;  Service: Endoscopy;  Laterality: N/A;  . OVARY SURGERY    . TRACHEOSTOMY  1959  . TUBAL LIGATION      Family History  Problem Relation Age of Onset  . Heart disease Father        MI  . Heart disease Mother   . Valvular heart disease Mother   . Breast cancer Sister 65  . Cancer Sister        Lung cancer  . COPD Sister   . Heart disease Sister   . Colon cancer Neg Hx     SOCIAL HX: reviewed.    Current Outpatient Medications:  .  acetaminophen (TYLENOL) 325 MG tablet, Take 2 tablets (650 mg total) by mouth every 6 (six) hours as needed for mild pain (or Fever >/= 101). (Patient not taking: Reported on 04/12/2019), Disp: , Rfl:  .  albuterol (PROAIR HFA) 108 (90 Base) MCG/ACT inhaler, USE 2 INHALATIONS EVERY 6 HOURS AS NEEDED FOR WHEEZING OR SHORTNESS OF BREATH (Patient not taking: Reported on 04/12/2019), Disp: 51 g, Rfl: 3 .  alendronate (FOSAMAX) 70 MG tablet, Take 70 mg by mouth every Monday. , Disp: , Rfl:  .  ALPRAZolam (XANAX) 0.25 MG tablet, Take 1 tablet (0.25 mg total) by mouth 2 (two) times daily as needed for anxiety., Disp: 20 tablet, Rfl: 0 .  apixaban (ELIQUIS) 5 MG TABS tablet, Take 5 mg by mouth 2 (two) times daily., Disp: , Rfl:  .  Calcium Carbonate-Vitamin D (CALCIUM 600+D) 600-400 MG-UNIT tablet, Take 1 tablet by mouth 2 (two) times  daily. , Disp: , Rfl:  .  cetirizine (ZYRTEC) 10 MG tablet, TAKE 1 TABLET DAILY (Patient taking differently: Take 10 mg by mouth at bedtime. ), Disp: 90 tablet, Rfl: 4 .  cholecalciferol (VITAMIN D) 1000 units tablet, Take 1,000 Units by mouth daily., Disp: , Rfl:  .  colestipol (COLESTID) 1 g tablet, Take 2 g by mouth daily. , Disp: , Rfl:  .  diltiazem (CARDIZEM CD) 240 MG 24 hr capsule, Take 1 capsule (240 mg total) by mouth daily., Disp: 30 capsule, Rfl: 0 .  diltiazem (CARDIZEM) 30 MG tablet, Take 30 mg by mouth 4 (four) times daily as needed (heartrate >100bpm). , Disp: , Rfl:  .  fluticasone (FLONASE) 50 MCG/ACT nasal spray, Place 2 sprays into both nostrils daily., Disp: 48 g, Rfl: 1 .  furosemide (LASIX) 40 MG tablet, Take 40 mg by mouth daily. , Disp: , Rfl:  .  lip balm (BLISTEX) OINT, Apply 1 application topically as needed for lip care., Disp: 1 Tube, Rfl: 0 .  meclizine (ANTIVERT) 25 MG tablet, take 1/2 to 1 tablet every 8 hours if needed for VERTIGO (Patient taking differently: Take 12.5-25 mg by mouth 3 (three) times daily as needed for dizziness. ), Disp: 40 tablet, Rfl: 3 .  metoprolol succinate (TOPROL-XL) 25 MG 24 hr tablet, Take 1 tablet (25 mg total) by mouth daily., Disp: 30 tablet, Rfl: 2 .  Multiple Vitamin (MULTIVITAMIN WITH MINERALS) TABS tablet, Take 1 tablet by mouth daily., Disp: , Rfl:  .  potassium chloride (MICRO-K) 10 MEQ CR capsule, TAKE 2 CAPSULES BY MOUTH EVERY DAY, Disp: 180 capsule, Rfl: 1 .  predniSONE (DELTASONE) 5 MG tablet, Take 1 tablet (5 mg total) by mouth daily with breakfast., Disp: , Rfl:  .  Probiotic Product (ALIGN PO), Take 1 tablet by mouth daily., Disp: , Rfl:  .  simvastatin (ZOCOR) 20 MG tablet, TAKE 1 TABLET DAILY (Patient taking differently: Take 20 mg by mouth daily. ), Disp: 90 tablet, Rfl: 4 .  sodium chloride (OCEAN) 0.65 % nasal spray, Place 1 spray into the nose as needed for congestion., Disp: , Rfl:   EXAM:  VITALS per patient if  applicable: [id number]  GENERAL: alert.  Sounds to be in no acute distress.  Answering questions appropriately.    PSYCH/NEURO: pleasant and cooperative, no obvious depression or anxiety, speech and thought processing grossly intact  ASSESSMENT AND PLAN:  Discussed the following assessment and plan:  A-fib [facility]) Recent admission for afib with RVR.  On oral cardizem - 180mg  now.  Doing well on this regimen.  Continue f/u with cardiology.    Anemia Follow cbc and ferritin.   Anxiety Discussed with her today.  Saw psychiatry.  Desires not to f/u with psychiatry.  Feels she is doing better.  Dr Clayborn Bigness is refilling her xanax.  Follow.    Dizziness States resolved after stopping cardizem 240mg  and changed to 180mg  q day.  Follow.    Fatty liver Evaluated by GI.  Follow liver function tests.    GERD (gastroesophageal reflux disease) Controlled.    Hypercholesterolemia On simvastatin.  Follow lipid panel and liver function tests.    Hypertension Blood pressure under good control.  Continue same medication regimen.  Follow pressures.  Follow metabolic panel.    Rheumatoid arthritis Followed by Dr [name].      I discussed the assessment and treatment plan with the patient. The patient was provided an opportunity to ask questions and all were answered. The patient agreed with the plan and demonstrated an understanding of the instructions.   The patient was advised to call back or seek an in-person evaluation if the symptoms worsen or if the condition fails to improve as anticipated.  I provided 18 minutes of non-face-to-face time during this encounter.   [name], MD

## 2019-06-22 ENCOUNTER — Encounter: Payer: Self-pay | Admitting: Internal Medicine

## 2019-06-22 NOTE — Assessment & Plan Note (Signed)
Evaluated by GI.  Follow liver function tests.

## 2019-06-22 NOTE — Assessment & Plan Note (Signed)
States resolved after stopping cardizem 240mg  and changed to 180mg  q day.  Follow.

## 2019-06-22 NOTE — Assessment & Plan Note (Signed)
On simvastatin.  Follow lipid panel and liver function tests.   

## 2019-06-22 NOTE — Assessment & Plan Note (Signed)
Recent admission for afib with RVR.  On oral cardizem - 180mg  now.  Doing well on this regimen.  Continue f/u with cardiology.

## 2019-06-22 NOTE — Assessment & Plan Note (Signed)
Followed by Dr Kernodle.   

## 2019-06-22 NOTE — Assessment & Plan Note (Signed)
Follow cbc and ferritin.  

## 2019-06-22 NOTE — Assessment & Plan Note (Signed)
Controlled.  

## 2019-06-22 NOTE — Assessment & Plan Note (Signed)
Discussed with her today.  Saw psychiatry.  Desires not to f/u with psychiatry.  Feels she is doing better.  Dr Clayborn Bigness is refilling her xanax.  Follow.

## 2019-06-22 NOTE — Assessment & Plan Note (Signed)
Blood pressure under good control.  Continue same medication regimen.  Follow pressures.  Follow metabolic panel.   

## 2019-07-04 ENCOUNTER — Other Ambulatory Visit: Payer: Self-pay | Admitting: Internal Medicine

## 2019-07-17 DIAGNOSIS — G4733 Obstructive sleep apnea (adult) (pediatric): Secondary | ICD-10-CM | POA: Diagnosis not present

## 2019-07-17 DIAGNOSIS — I059 Rheumatic mitral valve disease, unspecified: Secondary | ICD-10-CM | POA: Diagnosis not present

## 2019-07-17 DIAGNOSIS — I10 Essential (primary) hypertension: Secondary | ICD-10-CM | POA: Diagnosis not present

## 2019-07-17 DIAGNOSIS — E782 Mixed hyperlipidemia: Secondary | ICD-10-CM | POA: Diagnosis not present

## 2019-07-26 ENCOUNTER — Other Ambulatory Visit: Payer: Self-pay | Admitting: Internal Medicine

## 2019-08-08 ENCOUNTER — Other Ambulatory Visit: Payer: Self-pay

## 2019-08-08 ENCOUNTER — Ambulatory Visit (INDEPENDENT_AMBULATORY_CARE_PROVIDER_SITE_OTHER): Payer: Medicare Other

## 2019-08-08 ENCOUNTER — Encounter (INDEPENDENT_AMBULATORY_CARE_PROVIDER_SITE_OTHER): Payer: Self-pay

## 2019-08-08 VITALS — Ht 65.0 in | Wt 133.0 lb

## 2019-08-08 DIAGNOSIS — Z0000 Encounter for general adult medical examination without abnormal findings: Secondary | ICD-10-CM

## 2019-08-08 NOTE — Progress Notes (Addendum)
Subjective:   Kaitlyn Huff is a 79 y.o. female who presents for Medicare Annual (Subsequent) preventive examination.  Review of Systems:  No ROS.  Medicare Wellness Virtual Visit.  Visual/audio telehealth visit, UTA vital signs.   Ht/Wt provided. See social history for additional risk factors.  Cardiac Risk Factors include: advanced age (>29men, >43 women)     Objective:     Vitals: Ht 5\' 5"  (1.651 m)   Wt 133 lb (60.3 kg)   BMI 22.13 kg/m   Body mass index is 22.13 kg/m.  Advanced Directives 08/08/2019 04/03/2019 04/03/2019 03/29/2019 02/27/2019 02/27/2019 08/03/2018  Does Patient Have a Medical Advance Directive? Yes No No No No No Yes  Type of Paramedic of [place];Living will - - - - - Press photographer  Does patient want to make changes to medical advance directive? No - Patient declined - - - - - No - Patient declined  Copy of Guy in Chart? Yes - validated most recent copy scanned in chart (See row information) - - - - - No - copy requested  Would patient like information on creating a medical advance directive? - Yes (Inpatient - patient requests chaplain consult to create a medical advance directive) - - No - Patient declined - -    Tobacco Social History   Tobacco Use  Smoking Status Never Smoker  Smokeless Tobacco Never Used     Counseling given: Not Answered   Clinical Intake:  Pre-visit preparation completed: Yes        Diabetes: No  How often do you need to have someone help you when you read instructions, pamphlets, or other written materials from your doctor or pharmacy?: 1 - Never  Interpreter Needed?: No     Past Medical History:  Diagnosis Date  . Anemia   . Cancer ([place])   . Diverticulitis   . GERD (gastroesophageal reflux disease)   . Hyperlipidemia   . Hypertension   . Osteoarthritis   . Osteoporosis    actonel  . Positive PPD    s/p INH (2006)  . Rheumatoid  arthritis(714.0)    MTX transaminitis, Leflunomide (rash), enbrel, plaquinil, prednisone, remicade, Imuran (transaminitis)  . 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 ABDOMINAL HYSTERECTOMY    . CERVICAL CONE BIOPSY    . CHOLECYSTECTOMY  06/22/14  . COLONOSCOPY WITH PROPOFOL N/A 07/09/2015   Procedure: COLONOSCOPY WITH PROPOFOL;  Surgeon: Manya Silvas, MD;  Location: [facility] ENDOSCOPY;  Service: Endoscopy;  Laterality: N/A;  . ESOPHAGOGASTRODUODENOSCOPY (EGD) WITH PROPOFOL N/A 07/09/2015   Procedure: ESOPHAGOGASTRODUODENOSCOPY (EGD) WITH PROPOFOL;  Surgeon: Manya Silvas, MD;  Location[facility] ENDOSCOPY;  Service: Endoscopy;  Laterality: N/A;  . OVARY SURGERY    . TRACHEOSTOMY  1959  . TUBAL LIGATION     Family History  Problem Relation Age of Onset  . Heart disease Father        MI  . Heart disease Mother   . Valvular heart disease Mother   . Breast cancer Sister 37  . Cancer Sister        Lung cancer  . COPD Sister   . Heart disease Sister   . Colon cancer Neg Hx    Social History   Socioeconomic History  . Marital status: Widowed    Spouse name: Not on file  . Number of children: 4  . Years  of education: Not on file  . Highest education level: Not on file  Occupational History  . Occupation: retired  Tobacco Use  . Smoking status: Never Smoker  . Smokeless tobacco: Never Used  Substance and Sexual Activity  . Alcohol use: No    Alcohol/week: 0.0 standard drinks  . Drug use: No  . Sexual activity: Never  Other Topics Concern  . Not on file  Social History Narrative   Lives with family at home   Social Determinants of Health   Financial Resource Strain: Low Risk   . Difficulty of Paying Living Expenses: Not hard at all  Food Insecurity: No Food Insecurity  . Worried About Charity fundraiser in the Last Year: Never true  . Ran Out of Food in the Last Year: Never true  Transportation Needs: No  Transportation Needs  . Lack of Transportation (Medical): No  . Lack of Transportation (Non-Medical): No  Physical Activity:   . Days of Exercise per Week: Not on file  . Minutes of Exercise per Session: Not on file  Stress: No Stress Concern Present  . Feeling of Stress : Not at all  Social Connections: Unknown  . Frequency of Communication with Friends and Family: More than three times a week  . Frequency of Social Gatherings with Friends and Family: More than three times a week  . Attends Religious Services: Not on file  . Active Member of Clubs or Organizations: Yes  . Attends Archivist Meetings: Not on file  . Marital Status: Not on file    Outpatient Encounter Medications as of 08/08/2019  Medication Sig  . acetaminophen (TYLENOL) 325 MG tablet Take 2 tablets (650 mg total) by mouth every 6 (six) hours as needed for mild pain (or Fever >/= 101).  Marland Kitchen albuterol (PROAIR HFA) 108 (90 Base) MCG/ACT inhaler USE 2 INHALATIONS EVERY 6 HOURS AS NEEDED FOR WHEEZING OR SHORTNESS OF BREATH  . alendronate (FOSAMAX) 70 MG tablet Take 70 mg by mouth every Monday.   . ALPRAZolam (XANAX) 0.25 MG tablet Take 1 tablet (0.25 mg total) by mouth 2 (two) times daily as needed for anxiety.  Marland Kitchen apixaban (ELIQUIS) 5 MG TABS tablet Take 5 mg by mouth 2 (two) times daily.  . Calcium Carbonate-Vitamin D (CALCIUM 600+D) 600-400 MG-UNIT tablet Take 1 tablet by mouth 2 (two) times daily.   . cetirizine (ZYRTEC) 10 MG tablet TAKE 1 TABLET DAILY  . cholecalciferol (VITAMIN D) 1000 units tablet Take 1,000 Units by mouth daily.  . colestipol (COLESTID) 1 g tablet Take 2 g by mouth daily.   Marland Kitchen diltiazem (CARDIZEM CD) 240 MG 24 hr capsule Take 1 capsule (240 mg total) by mouth daily.  Marland Kitchen diltiazem (CARDIZEM) 30 MG tablet Take 30 mg by mouth 4 (four) times daily as needed (heartrate >100bpm).   . fluticasone (FLONASE) 50 MCG/ACT nasal spray Place 2 sprays into both nostrils daily.  . furosemide (LASIX) 40 MG  tablet Take 40 mg by mouth daily.   Marland Kitchen lip balm (BLISTEX) OINT Apply 1 application topically as needed for lip care.  . meclizine (ANTIVERT) 25 MG tablet take 1/2 to 1 tablet every 8 hours if needed for VERTIGO (Patient taking differently: Take 12.5-25 mg by mouth 3 (three) times daily as needed for dizziness. )  . Multiple Vitamin (MULTIVITAMIN WITH MINERALS) TABS tablet Take 1 tablet by mouth daily.  . potassium chloride (MICRO-K) 10 MEQ CR capsule TAKE 2 CAPSULES BY MOUTH EVERY DAY  .  predniSONE (DELTASONE) 5 MG tablet Take 1 tablet (5 mg total) by mouth daily with breakfast.  . Probiotic Product (ALIGN PO) Take 1 tablet by mouth daily.  . simvastatin (ZOCOR) 20 MG tablet TAKE 1 TABLET DAILY (Patient taking differently: Take 20 mg by mouth daily. )  . metoprolol succinate (TOPROL-XL) 25 MG 24 hr tablet Take 1 tablet (25 mg total) by mouth daily.  . [DISCONTINUED] sodium chloride (OCEAN) 0.65 % nasal spray Place 1 spray into the nose as needed for congestion.   No facility-administered encounter medications on file as of 08/08/2019.    Activities of Daily Living In your present state of health, do you have any difficulty performing the following activities: 08/08/2019 04/03/2019  Hearing? N N  Vision? N N  Difficulty concentrating or making decisions? N N  Walking or climbing stairs? N N  Dressing or bathing? N N  Doing errands, shopping? N N  Preparing Food and eating ? N -  Using the Toilet? N -  In the past six months, have you accidently leaked urine? N -  Do you have problems with loss of bowel control? N -  Managing your Medications? N -  Managing your Finances? N -  Housekeeping or managing your Housekeeping? N -  Some recent data might be hidden    Patient Care Team: [name], MD as PCP - General (Internal Medicine)    Assessment:   This is a routine wellness examination for [place].  Nurse connected with patient 08/08/19 at 11:30 AM EST by a telephone enabled  telemedicine application and verified that I am speaking with the correct person using two identifiers. Patient stated full name and DOB. Patient gave permission to continue with virtual visit. Patient's location was at home and Nurse's location was at [place] office.   Patient is alert and oriented x3. Patient denies difficulty focusing or concentrating. Patient likes to complete word search puzzles for brain stimulation.   Health Maintenance Due: -Tdap vaccine- discussed; to be completed with doctor in visit or local pharmacy.   See completed HM at the end of note.   Eye: Visual acuity not assessed. Virtual visit. Followed by their ophthalmologist.  Dental: Dentures- yes  Hearing: Demonstrates normal hearing during visit.  Safety:  Patient feels safe at home- yes Patient does have smoke detectors at home- yes Patient does wear sunscreen or protective clothing when in direct sunlight - yes Patient does wear seat belt when in a moving vehicle - yes Patient drives- yes Adequate lighting in walkways free from debris- yes Grab bars and handrails used as appropriate- yes Ambulates with an assistive device- no Cell phone on person when ambulating outside of the home- yes  Social: Alcohol intake - no     Smoking history- never   Smokers in home? none Illicit drug use? none  Medication: Taking as directed and without issues.  Self managed - yes   Covid-19: Precautions and sickness symptoms discussed. Wears mask, social distancing, hand hygiene as appropriate.   Activities of Daily Living Patient denies needing assistance with: household chores, feeding themselves, getting from bed to chair, getting to the toilet, bathing/showering, dressing, managing money, or preparing meals.   Discussed the importance of a healthy diet, water intake and the benefits of aerobic exercise.  Physical activity- walking inside the home.  Diet:  Regular Water: good intake  Other  Providers Patient Care Team: [name], MD as PCP - General (Internal Medicine)  Exercise Activities and Dietary recommendations Current  Exercise Habits: Home exercise routine  Goals      Patient Stated   . Healthy Lifestyle (pt-stated)     Stay active        Fall Risk Fall Risk  08/08/2019 08/03/2018 12/21/2017 07/14/2017 06/06/2016  Falls in the past year? 0 0 No No No  Number falls in past yr: - - - - -  Injury with Fall? - - - - -  Risk for fall due to : - - - - -  Risk for fall due to: Comment - - - - -  Follow up Falls evaluation completed - - - -  Timed Get Up and Go performed: no, virtual visit  Depression Screen PHQ 2/9 Scores 08/08/2019 08/03/2018 12/21/2017 07/14/2017  PHQ - 2 Score 0 0 0 0  PHQ- 9 Score - - - -     Cognitive Function MMSE - Mini Mental State Exam 05/12/2016  Orientation to time 5  Orientation to Place 5  Registration 3  Attention/ Calculation 5  Recall 3  Language- name 2 objects 2  Language- repeat 1  Language- follow 3 step command 3  Language- read & follow direction 1  Write a sentence 1  Copy design 1  Total score 30     6CIT Screen 08/08/2019 08/03/2018 07/14/2017  What Year? 0 points 0 points 0 points  What month? 0 points 0 points 0 points  What time? 0 points 0 points 0 points  Count back from 20 0 points 0 points 0 points  Months in reverse 0 points 0 points 0 points  Repeat phrase 0 points 0 points 0 points  Total Score 0 0 0    Immunization History  Administered Date(s) Administered  . Fluad Quad(high Dose 65+) 04/05/2019  . Influenza Split 05/17/2012, 03/03/2014  . Influenza, High Dose Seasonal PF 02/25/2017, 03/29/2018  . Influenza,inj,Quad PF,6+ Mos 03/25/2013, 08/05/2015  . Influenza-Unspecified 03/25/2013  . Pneumococcal Conjugate-13 05/21/2018  . Pneumococcal Polysaccharide-23 02/24/2017   Screening Tests Health Maintenance  Topic Date Due  . TETANUS/TDAP  01/20/2019  . COLONOSCOPY  07/08/2020  . INFLUENZA  VACCINE  Completed  . DEXA SCAN  Completed  . PNA vac Low Risk Adult  Completed      Plan:   Keep all routine maintenance appointments.   Next scheduled fasting labs 10/22/19 @ 11:30.  Follow up 10/24/19 @ 2:00  Medicare Attestation I have personally reviewed: The patient's medical and social history Their use of alcohol, tobacco or illicit drugs Their current medications and supplements The patient's functional ability including ADLs,fall risks, home safety risks, cognitive, and hearing and visual impairment Diet and physical activities Evidence for depression    I have reviewed and discussed with patient certain preventive protocols, quality metrics, and best practice recommendations.  Varney Biles, LPN  [id number]   Reviewed above information.  Agree with assessment and plan.   Dr Nicki Reaper

## 2019-08-08 NOTE — Patient Instructions (Addendum)
  Ms. Pacey , Thank you for taking time to come for your Medicare Wellness Visit. I appreciate your ongoing commitment to your health goals. Please review the following plan we discussed and let me know if I can assist you in the future.   These are the goals we discussed: Goals      Patient Stated   . Healthy Lifestyle (pt-stated)     Stay active        This is a list of the screening recommended for you and due dates:  Health Maintenance  Topic Date Due  . Tetanus Vaccine  01/20/2019  . Colon Cancer Screening  07/08/2020  . Flu Shot  Completed  . DEXA scan (bone density measurement)  Completed  . Pneumonia vaccines  Completed

## 2019-08-13 DIAGNOSIS — H35383 Toxic maculopathy, bilateral: Secondary | ICD-10-CM | POA: Diagnosis not present

## 2019-08-13 DIAGNOSIS — H43813 Vitreous degeneration, bilateral: Secondary | ICD-10-CM | POA: Diagnosis not present

## 2019-08-13 DIAGNOSIS — Z79899 Other long term (current) drug therapy: Secondary | ICD-10-CM | POA: Diagnosis not present

## 2019-08-20 DIAGNOSIS — M05732 Rheumatoid arthritis with rheumatoid factor of left wrist without organ or systems involvement: Secondary | ICD-10-CM | POA: Diagnosis not present

## 2019-08-20 DIAGNOSIS — M05731 Rheumatoid arthritis with rheumatoid factor of right wrist without organ or systems involvement: Secondary | ICD-10-CM | POA: Diagnosis not present

## 2019-08-20 DIAGNOSIS — M0579 Rheumatoid arthritis with rheumatoid factor of multiple sites without organ or systems involvement: Secondary | ICD-10-CM | POA: Diagnosis not present

## 2019-09-03 DIAGNOSIS — K2950 Unspecified chronic gastritis without bleeding: Secondary | ICD-10-CM | POA: Diagnosis not present

## 2019-09-03 DIAGNOSIS — Z86010 Personal history of colonic polyps: Secondary | ICD-10-CM | POA: Diagnosis not present

## 2019-09-03 DIAGNOSIS — K219 Gastro-esophageal reflux disease without esophagitis: Secondary | ICD-10-CM | POA: Diagnosis not present

## 2019-09-03 DIAGNOSIS — K649 Unspecified hemorrhoids: Secondary | ICD-10-CM | POA: Diagnosis not present

## 2019-09-03 DIAGNOSIS — K529 Noninfective gastroenteritis and colitis, unspecified: Secondary | ICD-10-CM | POA: Diagnosis not present

## 2019-09-03 DIAGNOSIS — K760 Fatty (change of) liver, not elsewhere classified: Secondary | ICD-10-CM | POA: Diagnosis not present

## 2019-10-11 ENCOUNTER — Other Ambulatory Visit: Payer: Self-pay | Admitting: Internal Medicine

## 2019-10-15 DIAGNOSIS — M05732 Rheumatoid arthritis with rheumatoid factor of left wrist without organ or systems involvement: Secondary | ICD-10-CM | POA: Diagnosis not present

## 2019-10-15 DIAGNOSIS — M0579 Rheumatoid arthritis with rheumatoid factor of multiple sites without organ or systems involvement: Secondary | ICD-10-CM | POA: Diagnosis not present

## 2019-10-15 DIAGNOSIS — M05731 Rheumatoid arthritis with rheumatoid factor of right wrist without organ or systems involvement: Secondary | ICD-10-CM | POA: Diagnosis not present

## 2019-10-16 ENCOUNTER — Other Ambulatory Visit
Admission: RE | Admit: 2019-10-16 | Discharge: 2019-10-16 | Disposition: A | Discharge status: Home / Self Care | Payer: Medicare Other | Source: Ambulatory Visit | Attending: Internal Medicine | Admitting: Internal Medicine

## 2019-10-16 DIAGNOSIS — M25462 Effusion, left knee: Secondary | ICD-10-CM | POA: Insufficient documentation

## 2019-10-16 DIAGNOSIS — M05772 Rheumatoid arthritis with rheumatoid factor of left ankle and foot without organ or systems involvement: Secondary | ICD-10-CM | POA: Diagnosis not present

## 2019-10-16 DIAGNOSIS — M05771 Rheumatoid arthritis with rheumatoid factor of right ankle and foot without organ or systems involvement: Secondary | ICD-10-CM | POA: Diagnosis not present

## 2019-10-16 DIAGNOSIS — Z79899 Other long term (current) drug therapy: Secondary | ICD-10-CM | POA: Diagnosis not present

## 2019-10-16 LAB — SYNOVIAL CELL COUNT + DIFF, W/ CRYSTALS
Eosinophils-Synovial: 0 %
Lymphocytes-Synovial Fld: 7 %
Monocyte-Macrophage-Synovial Fluid: 3 %
Neutrophil, Synovial: 90 %
WBC, Synovial: 51506 /mm3 — ABNORMAL HIGH (ref 0–200)

## 2019-10-17 DIAGNOSIS — I10 Essential (primary) hypertension: Secondary | ICD-10-CM | POA: Diagnosis not present

## 2019-10-17 DIAGNOSIS — I059 Rheumatic mitral valve disease, unspecified: Secondary | ICD-10-CM | POA: Diagnosis not present

## 2019-10-17 DIAGNOSIS — G4733 Obstructive sleep apnea (adult) (pediatric): Secondary | ICD-10-CM | POA: Diagnosis not present

## 2019-10-17 DIAGNOSIS — I480 Paroxysmal atrial fibrillation: Secondary | ICD-10-CM | POA: Diagnosis not present

## 2019-10-17 DIAGNOSIS — J9601 Acute respiratory failure with hypoxia: Secondary | ICD-10-CM | POA: Diagnosis not present

## 2019-10-17 DIAGNOSIS — I38 Endocarditis, valve unspecified: Secondary | ICD-10-CM | POA: Diagnosis not present

## 2019-10-17 DIAGNOSIS — J441 Chronic obstructive pulmonary disease with (acute) exacerbation: Secondary | ICD-10-CM | POA: Diagnosis not present

## 2019-10-22 ENCOUNTER — Other Ambulatory Visit: Payer: Self-pay

## 2019-10-22 ENCOUNTER — Other Ambulatory Visit (INDEPENDENT_AMBULATORY_CARE_PROVIDER_SITE_OTHER): Payer: Medicare Other

## 2019-10-22 DIAGNOSIS — M0579 Rheumatoid arthritis with rheumatoid factor of multiple sites without organ or systems involvement: Secondary | ICD-10-CM | POA: Diagnosis not present

## 2019-10-22 DIAGNOSIS — R739 Hyperglycemia, unspecified: Secondary | ICD-10-CM | POA: Diagnosis not present

## 2019-10-22 DIAGNOSIS — I10 Essential (primary) hypertension: Secondary | ICD-10-CM | POA: Diagnosis not present

## 2019-10-22 DIAGNOSIS — D649 Anemia, unspecified: Secondary | ICD-10-CM

## 2019-10-22 DIAGNOSIS — Z8739 Personal history of other diseases of the musculoskeletal system and connective tissue: Secondary | ICD-10-CM | POA: Diagnosis not present

## 2019-10-22 DIAGNOSIS — E7800 Pure hypercholesterolemia, unspecified: Secondary | ICD-10-CM

## 2019-10-22 DIAGNOSIS — I479 Paroxysmal tachycardia, unspecified: Secondary | ICD-10-CM | POA: Diagnosis not present

## 2019-10-22 DIAGNOSIS — M810 Age-related osteoporosis without current pathological fracture: Secondary | ICD-10-CM | POA: Diagnosis not present

## 2019-10-22 LAB — CBC WITH DIFFERENTIAL/PLATELET
Basophils Absolute: 0 10*3/uL (ref 0.0–0.1)
Basophils Relative: 0.6 % (ref 0.0–3.0)
Eosinophils Absolute: 0.1 10*3/uL (ref 0.0–0.7)
Eosinophils Relative: 1.4 % (ref 0.0–5.0)
HCT: 34.7 % — ABNORMAL LOW (ref 36.0–46.0)
Hemoglobin: 11.9 g/dL — ABNORMAL LOW (ref 12.0–15.0)
Lymphocytes Relative: 22.2 % (ref 12.0–46.0)
Lymphs Abs: 1.8 10*3/uL (ref 0.7–4.0)
MCHC: 34.3 g/dL (ref 30.0–36.0)
MCV: 93 fl (ref 78.0–100.0)
Monocytes Absolute: 0.5 10*3/uL (ref 0.1–1.0)
Monocytes Relative: 5.8 % (ref 3.0–12.0)
Neutro Abs: 5.6 10*3/uL (ref 1.4–7.7)
Neutrophils Relative %: 70 % (ref 43.0–77.0)
Platelets: 300 10*3/uL (ref 150.0–400.0)
RBC: 3.73 Mil/uL — ABNORMAL LOW (ref 3.87–5.11)
RDW: 13 % (ref 11.5–15.5)
WBC: 8 10*3/uL (ref 4.0–10.5)

## 2019-10-22 LAB — LIPID PANEL
Cholesterol: 150 mg/dL (ref 0–200)
HDL: 65.1 mg/dL (ref >39.00)
LDL Cholesterol: 61 mg/dL (ref 0–99)
NonHDL: 84.67
Total CHOL/HDL Ratio: 2
Triglycerides: 117 mg/dL (ref 0.0–149.0)
VLDL: 23.4 mg/dL (ref 0.0–40.0)

## 2019-10-22 LAB — BASIC METABOLIC PANEL
BUN: 11 mg/dL (ref 6–23)
CO2: 30 mEq/L (ref 19–32)
Calcium: 9.3 mg/dL (ref 8.4–10.5)
Chloride: 99 mEq/L (ref 96–112)
Creatinine, Ser: 0.94 mg/dL (ref 0.40–1.20)
GFR: 57.82 mL/min — ABNORMAL LOW (ref >60.00)
Glucose, Bld: 109 mg/dL — ABNORMAL HIGH (ref 70–99)
Potassium: 3.5 mEq/L (ref 3.5–5.1)
Sodium: 137 mEq/L (ref 135–145)

## 2019-10-22 LAB — HEPATIC FUNCTION PANEL
ALT: 14 U/L (ref 0–35)
AST: 26 U/L (ref 0–37)
Albumin: 4.3 g/dL (ref 3.5–5.2)
Alkaline Phosphatase: 51 U/L (ref 39–117)
Bilirubin, Direct: 0.1 mg/dL (ref 0.0–0.3)
Total Bilirubin: 0.5 mg/dL (ref 0.2–1.2)
Total Protein: 7.6 g/dL (ref 6.0–8.3)

## 2019-10-22 LAB — HEMOGLOBIN A1C: Hgb A1c MFr Bld: 5.8 % (ref 4.6–6.5)

## 2019-10-22 LAB — FERRITIN: Ferritin: 52.1 ng/mL (ref 10.0–291.0)

## 2019-10-23 ENCOUNTER — Other Ambulatory Visit: Payer: Self-pay | Admitting: Internal Medicine

## 2019-10-23 DIAGNOSIS — Z1231 Encounter for screening mammogram for malignant neoplasm of breast: Secondary | ICD-10-CM

## 2019-10-24 ENCOUNTER — Encounter: Payer: Self-pay | Admitting: Internal Medicine

## 2019-10-24 ENCOUNTER — Ambulatory Visit (INDEPENDENT_AMBULATORY_CARE_PROVIDER_SITE_OTHER): Payer: Medicare Other | Admitting: Internal Medicine

## 2019-10-24 ENCOUNTER — Other Ambulatory Visit: Payer: Self-pay

## 2019-10-24 VITALS — BP 122/68 | HR 142 | Temp 97.1°F | Ht 65.0 in | Wt 136.2 lb

## 2019-10-24 DIAGNOSIS — M069 Rheumatoid arthritis, unspecified: Secondary | ICD-10-CM

## 2019-10-24 DIAGNOSIS — I4891 Unspecified atrial fibrillation: Secondary | ICD-10-CM

## 2019-10-24 DIAGNOSIS — E7800 Pure hypercholesterolemia, unspecified: Secondary | ICD-10-CM

## 2019-10-24 DIAGNOSIS — I10 Essential (primary) hypertension: Secondary | ICD-10-CM | POA: Diagnosis not present

## 2019-10-24 DIAGNOSIS — D649 Anemia, unspecified: Secondary | ICD-10-CM

## 2019-10-24 DIAGNOSIS — K760 Fatty (change of) liver, not elsewhere classified: Secondary | ICD-10-CM

## 2019-10-24 NOTE — Progress Notes (Signed)
Patient ID: VENETTE LOGEMANN, female   DOB: December 16, 1940, 79 y.o.   MRN: [id number]   Subjective:    Patient ID: [name], female    DOB: 1940/12/06, 79 y.o.   MRN: [id number]  HPI This visit occurred during the SARS-CoV-2 public health emergency.  Safety protocols were in place, including screening questions prior to the visit, additional usage of staff PPE, and extensive cleaning of exam room while observing appropriate contact time as indicated for disinfecting solutions.  Patient here for a scheduled follow up.  She reports she is doing relatively well.  Heart rate was increased on arrival.  Nurse documented with pulse ox - 140.  No manual check initially.  When I was notified and examined pt - pulse 100.  She had taken diltiazem - on arrival to the clinic.  States she has had a few episodes in the last few weeks - increased heart rate - reports rate 100.  Takes short acting diltiazem prn.  Just saw cardiology 10/17/19.  Diltiazem was changed to 120mg  bid.  Also started on eliquis.  Discussed medication.  She will clarify if taking correctly.  No chest pain.  No sob.  No acid reflux.  No abdominal pain.  Taking colestipol.  Bowels doing better.  On omeprazole.  No upper symptoms reported.     Past Medical History:  Diagnosis Date  . Anemia   . Cancer ([place])   . Diverticulitis   . GERD (gastroesophageal reflux disease)   . Hyperlipidemia   . Hypertension   . Osteoarthritis   . Osteoporosis    actonel  . Positive PPD    s/p INH (2006)  . Rheumatoid arthritis(714.0)    MTX transaminitis, Leflunomide (rash), enbrel, plaquinil, prednisone, remicade, Imuran (transaminitis)  . 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 ABDOMINAL HYSTERECTOMY    . CERVICAL CONE BIOPSY    . CHOLECYSTECTOMY  06/22/14  . COLONOSCOPY WITH PROPOFOL N/A 07/09/2015   Procedure: COLONOSCOPY WITH PROPOFOL;  Surgeon: Manya Silvas, MD;  Location: [facility]  ENDOSCOPY;  Service: Endoscopy;  Laterality: N/A;  . ESOPHAGOGASTRODUODENOSCOPY (EGD) WITH PROPOFOL N/A 07/09/2015   Procedure: ESOPHAGOGASTRODUODENOSCOPY (EGD) WITH PROPOFOL;  Surgeon: Manya Silvas, MD;  Location: [facility] ENDOSCOPY;  Service: Endoscopy;  Laterality: N/A;  . OVARY SURGERY    . TRACHEOSTOMY  1959  . TUBAL LIGATION     Family History  Problem Relation Age of Onset  . Heart disease Father        MI  . Heart disease Mother   . Valvular heart disease Mother   . Breast cancer Sister 31  . Cancer Sister        Lung cancer  . COPD Sister   . Heart disease Sister   . Colon cancer Neg Hx    Social History   Socioeconomic History  . Marital status: Widowed    Spouse name: Not on file  . Number of children: 4  . Years of education: Not on file  . Highest education level: Not on file  Occupational History  . Occupation: retired  Tobacco Use  . Smoking status: Never Smoker  . Smokeless tobacco: Never Used  Substance and Sexual Activity  . Alcohol use: No    Alcohol/week: 0.0 standard drinks  . Drug use: No  . Sexual activity: Never  Other Topics Concern  . Not on file  Social History Narrative   Lives  with family at home   Social Determinants of Health   Financial Resource Strain: Low Risk   . Difficulty of Paying Living Expenses: Not hard at all  Food Insecurity: No Food Insecurity  . Worried About Charity fundraiser in the Last Year: Never true  . Ran Out of Food in the Last Year: Never true  Transportation Needs: No Transportation Needs  . Lack of Transportation (Medical): No  . Lack of Transportation (Non-Medical): No  Physical Activity:   . Days of Exercise per Week:   . Minutes of Exercise per Session:   Stress: No Stress Concern Present  . Feeling of Stress : Not at all  Social Connections: Unknown  . Frequency of Communication with Friends and Family: More than three times a week  . Frequency of Social Gatherings with Friends and Family: More  than three times a week  . Attends Religious Services: Not on file  . Active Member of Clubs or Organizations: Yes  . Attends Archivist Meetings: Not on file  . Marital Status: Not on file    Outpatient Encounter Medications as of 10/24/2019  Medication Sig  . acetaminophen (TYLENOL) 325 MG tablet Take 2 tablets (650 mg total) by mouth every 6 (six) hours as needed for mild pain (or Fever >/= 101).  Marland Kitchen albuterol (PROAIR HFA) 108 (90 Base) MCG/ACT inhaler USE 2 INHALATIONS EVERY 6 HOURS AS NEEDED FOR WHEEZING OR SHORTNESS OF BREATH  . alendronate (FOSAMAX) 70 MG tablet Take 70 mg by mouth every Monday.   . ALPRAZolam (XANAX) 0.25 MG tablet Take 1 tablet (0.25 mg total) by mouth 2 (two) times daily as needed for anxiety.  Marland Kitchen apixaban (ELIQUIS) 5 MG TABS tablet Take 5 mg by mouth 2 (two) times daily.  . Calcium Carbonate-Vitamin D (CALCIUM 600+D) 600-400 MG-UNIT tablet Take 1 tablet by mouth 2 (two) times daily.   . cetirizine (ZYRTEC) 10 MG tablet TAKE 1 TABLET DAILY  . cholecalciferol (VITAMIN D) 1000 units tablet Take 1,000 Units by mouth daily.  . colestipol (COLESTID) 1 g tablet Take 2 g by mouth daily.   Marland Kitchen diltiazem (CARDIZEM CD) 240 MG 24 hr capsule Take 1 capsule (240 mg total) by mouth daily.  Marland Kitchen diltiazem (CARDIZEM) 30 MG tablet Take 30 mg by mouth 4 (four) times daily as needed (heartrate >100bpm).   . fluticasone (FLONASE) 50 MCG/ACT nasal spray Place 2 sprays into both nostrils daily.  . furosemide (LASIX) 40 MG tablet Take 40 mg by mouth daily.   Marland Kitchen lip balm (BLISTEX) OINT Apply 1 application topically as needed for lip care.  . meclizine (ANTIVERT) 25 MG tablet take 1/2 to 1 tablet every 8 hours if needed for VERTIGO (Patient taking differently: Take 12.5-25 mg by mouth 3 (three) times daily as needed for dizziness. )  . metoprolol succinate (TOPROL-XL) 25 MG 24 hr tablet Take 1 tablet (25 mg total) by mouth daily.  . Multiple Vitamin (MULTIVITAMIN WITH MINERALS) TABS  tablet Take 1 tablet by mouth daily.  . potassium chloride (MICRO-K) 10 MEQ CR capsule TAKE 2 CAPSULES BY MOUTH EVERY DAY  . predniSONE (DELTASONE) 5 MG tablet Take 1 tablet (5 mg total) by mouth daily with breakfast.  . Probiotic Product (ALIGN PO) Take 1 tablet by mouth daily.  . simvastatin (ZOCOR) 20 MG tablet TAKE 1 TABLET DAILY   No facility-administered encounter medications on file as of 10/24/2019.   Review of Systems  Constitutional: Negative for appetite change and unexpected  weight change.  HENT: Negative for congestion and sinus pressure.   Respiratory: Negative for cough, chest tightness and shortness of breath.   Cardiovascular: Negative for chest pain and leg swelling.       Increased heart rate as outlined.    Gastrointestinal: Negative for abdominal pain, diarrhea, nausea and vomiting.  Genitourinary: Negative for difficulty urinating and dysuria.  Musculoskeletal: Negative for joint swelling and myalgias.  Skin: Negative for color change and rash.  Neurological: Negative for dizziness, light-headedness and headaches.  Psychiatric/Behavioral: Negative for agitation and dysphoric mood.       Objective:    Physical Exam Vitals reviewed.  Constitutional:      General: She is not in acute distress.    Appearance: Normal appearance.  HENT:     Head: Normocephalic and atraumatic.     Right Ear: External ear normal.     Left Ear: External ear normal.  Eyes:     General: No scleral icterus.       Right eye: No discharge.        Left eye: No discharge.     Conjunctiva/sclera: Conjunctivae normal.  Neck:     Thyroid: No thyromegaly.  Cardiovascular:     Rate and Rhythm: Normal rate and regular rhythm.  Pulmonary:     Effort: No respiratory distress.     Breath sounds: Normal breath sounds. No wheezing.  Abdominal:     General: Bowel sounds are normal.     Palpations: Abdomen is soft.     Tenderness: There is no abdominal tenderness.  Musculoskeletal:         General: No swelling or tenderness.     Cervical back: Neck supple. No tenderness.  Lymphadenopathy:     Cervical: No cervical adenopathy.  Skin:    Findings: No erythema or rash.  Neurological:     Mental Status: She is alert.  Psychiatric:        Mood and Affect: Mood normal.        Behavior: Behavior normal.     BP 122/68   Pulse (!) 142   Temp (!) 97.1 F (36.2 C) (Temporal)   Ht 5\' 5"  (1.651 m)   Wt 136 lb 3.2 oz (61.8 kg)   SpO2 97%   BMI 22.66 kg/m  Wt Readings from Last 3 Encounters:  10/24/19 136 lb 3.2 oz (61.8 kg)  08/08/19 133 lb (60.3 kg)  06/18/19 133 lb (60.3 kg)     Lab Results  Component Value Date   WBC 8.0 10/22/2019   HGB 11.9 (L) 10/22/2019   HCT 34.7 (L) 10/22/2019   PLT 300.0 10/22/2019   GLUCOSE 109 (H) 10/22/2019   CHOL 150 10/22/2019   TRIG 117.0 10/22/2019   HDL 65.10 10/22/2019   LDLDIRECT 73.7 08/20/2013   LDLCALC 61 10/22/2019   ALT 14 10/22/2019   AST 26 10/22/2019   NA 137 10/22/2019   K 3.5 10/22/2019   CL 99 10/22/2019   CREATININE 0.94 10/22/2019   BUN 11 10/22/2019   CO2 30 10/22/2019   TSH 2.02 05/01/2019   INR 1.4 (H) 04/03/2019   HGBA1C 5.8 10/22/2019       Assessment & Plan:   Problem List Items Addressed This Visit    A-fib ([place]) - Primary    History of afib.  On oral cardizem.  Recently dose changed to 120mg  bid.  She will confirm taking correctly when home.  Has short acting diltiazem to take prn.  Had episode of  increased heart rate in office.  Took 30mg  diltiazem.  Heart now 100.  EKG - afib with ventricular rate 100.  She is feeling at her baseline.  Discussed with cardiology.  Recommended continuing to monitor and to call if persistent problems.        Relevant Orders   EKG 12-Lead   Anemia    Follow cbc and ferritin.        Fatty liver    Followed by GI.  Follow liver function tests.        Hypercholesterolemia    On simvastatin.  Follow lipid panel and liver function tests.         Hypertension    On diltiazem and lasix.  Follow pressures.  Follow metabolic panel.        Rheumatoid arthritis ([place])    Followed by Dr [name].  Stable.           [name], MD

## 2019-10-27 ENCOUNTER — Encounter: Payer: Self-pay | Admitting: Internal Medicine

## 2019-10-27 NOTE — Assessment & Plan Note (Signed)
On simvastatin.  Follow lipid panel and liver function tests.   

## 2019-10-27 NOTE — Assessment & Plan Note (Signed)
Followed by GI.  Follow liver function tests.  

## 2019-10-27 NOTE — Assessment & Plan Note (Signed)
History of afib.  On oral cardizem.  Recently dose changed to 120mg  bid.  She will confirm taking correctly when home.  Has short acting diltiazem to take prn.  Had episode of increased heart rate in office.  Took 30mg  diltiazem.  Heart now 100.  EKG - afib with ventricular rate 100.  She is feeling at her baseline.  Discussed with cardiology.  Recommended continuing to monitor and to call if persistent problems.

## 2019-10-27 NOTE — Assessment & Plan Note (Signed)
Followed by Dr Kernodle.  Stable.  

## 2019-10-27 NOTE — Assessment & Plan Note (Signed)
Follow cbc and ferritin.  

## 2019-10-27 NOTE — Assessment & Plan Note (Signed)
On diltiazem and lasix.  Follow pressures.  Follow metabolic panel.

## 2019-11-19 ENCOUNTER — Ambulatory Visit
Admission: RE | Admit: 2019-11-19 | Discharge: 2019-11-19 | Disposition: A | Discharge status: Home / Self Care | Payer: Medicare Other | Source: Ambulatory Visit | Attending: Internal Medicine | Admitting: Internal Medicine

## 2019-11-19 DIAGNOSIS — Z1231 Encounter for screening mammogram for malignant neoplasm of breast: Secondary | ICD-10-CM | POA: Insufficient documentation

## 2019-11-21 DIAGNOSIS — H43813 Vitreous degeneration, bilateral: Secondary | ICD-10-CM | POA: Diagnosis not present

## 2019-11-21 DIAGNOSIS — Z79899 Other long term (current) drug therapy: Secondary | ICD-10-CM | POA: Diagnosis not present

## 2019-11-27 ENCOUNTER — Other Ambulatory Visit: Payer: Self-pay | Admitting: Internal Medicine

## 2019-11-27 NOTE — Telephone Encounter (Signed)
Refill request for potassium, last seen 10-24-19, last filled 06-17-20.  Please advise.  Potassium last checked: 10-22-19 Potassium Result: 3.5

## 2019-11-27 NOTE — Telephone Encounter (Signed)
rx ok'd for potassium refill #180 with no refills.

## 2019-12-04 DIAGNOSIS — I480 Paroxysmal atrial fibrillation: Secondary | ICD-10-CM | POA: Diagnosis not present

## 2019-12-04 DIAGNOSIS — I739 Peripheral vascular disease, unspecified: Secondary | ICD-10-CM | POA: Diagnosis not present

## 2019-12-04 DIAGNOSIS — I38 Endocarditis, valve unspecified: Secondary | ICD-10-CM | POA: Diagnosis not present

## 2019-12-04 DIAGNOSIS — R600 Localized edema: Secondary | ICD-10-CM | POA: Diagnosis not present

## 2019-12-04 DIAGNOSIS — I10 Essential (primary) hypertension: Secondary | ICD-10-CM | POA: Diagnosis not present

## 2019-12-04 DIAGNOSIS — E782 Mixed hyperlipidemia: Secondary | ICD-10-CM | POA: Diagnosis not present

## 2019-12-04 DIAGNOSIS — G4733 Obstructive sleep apnea (adult) (pediatric): Secondary | ICD-10-CM | POA: Diagnosis not present

## 2019-12-04 DIAGNOSIS — I059 Rheumatic mitral valve disease, unspecified: Secondary | ICD-10-CM | POA: Diagnosis not present

## 2019-12-06 ENCOUNTER — Ambulatory Visit: Payer: Medicare Other | Admitting: Internal Medicine

## 2019-12-17 ENCOUNTER — Ambulatory Visit: Payer: Medicare Other | Admitting: Internal Medicine

## 2019-12-17 DIAGNOSIS — M0579 Rheumatoid arthritis with rheumatoid factor of multiple sites without organ or systems involvement: Secondary | ICD-10-CM | POA: Diagnosis not present

## 2019-12-17 DIAGNOSIS — G629 Polyneuropathy, unspecified: Secondary | ICD-10-CM | POA: Diagnosis not present

## 2019-12-17 DIAGNOSIS — R609 Edema, unspecified: Secondary | ICD-10-CM | POA: Diagnosis not present

## 2019-12-17 DIAGNOSIS — M05732 Rheumatoid arthritis with rheumatoid factor of left wrist without organ or systems involvement: Secondary | ICD-10-CM | POA: Diagnosis not present

## 2019-12-17 DIAGNOSIS — M05731 Rheumatoid arthritis with rheumatoid factor of right wrist without organ or systems involvement: Secondary | ICD-10-CM | POA: Diagnosis not present

## 2020-01-07 ENCOUNTER — Ambulatory Visit: Payer: Medicare Other | Admitting: Internal Medicine

## 2020-01-27 ENCOUNTER — Ambulatory Visit (INDEPENDENT_AMBULATORY_CARE_PROVIDER_SITE_OTHER): Payer: Medicare Other | Admitting: Internal Medicine

## 2020-01-27 ENCOUNTER — Other Ambulatory Visit: Payer: Self-pay

## 2020-01-27 DIAGNOSIS — R739 Hyperglycemia, unspecified: Secondary | ICD-10-CM | POA: Diagnosis not present

## 2020-01-27 DIAGNOSIS — F419 Anxiety disorder, unspecified: Secondary | ICD-10-CM | POA: Diagnosis not present

## 2020-01-27 DIAGNOSIS — R1909 Other intra-abdominal and pelvic swelling, mass and lump: Secondary | ICD-10-CM | POA: Diagnosis not present

## 2020-01-27 DIAGNOSIS — Z860100 Personal history of colon polyps, unspecified: Secondary | ICD-10-CM

## 2020-01-27 DIAGNOSIS — J329 Chronic sinusitis, unspecified: Secondary | ICD-10-CM

## 2020-01-27 DIAGNOSIS — I4891 Unspecified atrial fibrillation: Secondary | ICD-10-CM | POA: Diagnosis not present

## 2020-01-27 DIAGNOSIS — D649 Anemia, unspecified: Secondary | ICD-10-CM | POA: Diagnosis not present

## 2020-01-27 DIAGNOSIS — E7800 Pure hypercholesterolemia, unspecified: Secondary | ICD-10-CM

## 2020-01-27 DIAGNOSIS — K760 Fatty (change of) liver, not elsewhere classified: Secondary | ICD-10-CM | POA: Diagnosis not present

## 2020-01-27 DIAGNOSIS — I10 Essential (primary) hypertension: Secondary | ICD-10-CM | POA: Diagnosis not present

## 2020-01-27 DIAGNOSIS — G2581 Restless legs syndrome: Secondary | ICD-10-CM

## 2020-01-27 DIAGNOSIS — M069 Rheumatoid arthritis, unspecified: Secondary | ICD-10-CM | POA: Diagnosis not present

## 2020-01-27 MED ORDER — LEVOFLOXACIN 500 MG PO TABS: 500.0000 mg | ORAL_TABLET | Freq: Every day | ORAL | 0 refills | Status: DC

## 2020-01-27 NOTE — Progress Notes (Signed)
Patient ID: Kaitlyn Huff, female   DOB: [birth year], 79 y.o.   MRN: [id number]   Subjective:    Patient ID: Kaitlyn Huff, female    DOB: 1941/07/03, 79 y.o.   MRN: [id number]  HPI This visit occurred during the SARS-CoV-2 public health emergency.  Safety protocols were in place, including screening questions prior to the visit, additional usage of staff PPE, and extensive cleaning of exam room while observing appropriate contact time as indicated for disinfecting solutions.  Patient here for a scheduled follow up. She reports she is doing relatively well.  Sees Dr [name] for her joints.  Has noticed some increased ankle pain and swelling.  Eating.  No nausea or vomiting.  Bowels moving.  No abdominal pain.  No chest pain or sob.  Does report some increased sinus pressure, congestion and drainage.  Has been present for two weeks.  Has been using flonase, saline nasal spray and robitussin. Symptoms not improving - are progressing.  Feels like her typical sinus infection.  Also reports fullness/"knot" right groin.  No increased erythema or warmth.    Past Medical History:  Diagnosis Date  . Anemia   . Cancer ([place])   . Diverticulitis   . GERD (gastroesophageal reflux disease)   . Hyperlipidemia   . Hypertension   . Osteoarthritis   . Osteoporosis    actonel  . Positive PPD    s/p INH (2006)  . Rheumatoid arthritis(714.0)    MTX transaminitis, Leflunomide (rash), enbrel, plaquinil, prednisone, remicade, Imuran (transaminitis)  . 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 ABDOMINAL HYSTERECTOMY    . CERVICAL CONE BIOPSY    . CHOLECYSTECTOMY  06/22/14  . COLONOSCOPY WITH PROPOFOL N/A 07/09/2015   Procedure: COLONOSCOPY WITH PROPOFOL;  Surgeon: Manya Silvas, MD;  Location: [facility] ENDOSCOPY;  Service: Endoscopy;  Laterality: N/A;  . ESOPHAGOGASTRODUODENOSCOPY (EGD) WITH PROPOFOL N/A 07/09/2015   Procedure:  ESOPHAGOGASTRODUODENOSCOPY (EGD) WITH PROPOFOL;  Surgeon: Manya Silvas, MD;  Location: [facility] ENDOSCOPY;  Service: Endoscopy;  Laterality: N/A;  . OVARY SURGERY    . TRACHEOSTOMY  1959  . TUBAL LIGATION     Family History  Problem Relation Age of Onset  . Heart disease Father        MI  . Heart disease Mother   . Valvular heart disease Mother   . Breast cancer Sister 69  . Cancer Sister        Lung cancer  . COPD Sister   . Heart disease Sister   . Colon cancer Neg Hx    Social History   Socioeconomic History  . Marital status: Widowed    Spouse name: Not on file  . Number of children: 4  . Years of education: Not on file  . Highest education level: Not on file  Occupational History  . Occupation: retired  Tobacco Use  . Smoking status: Never Smoker  . Smokeless tobacco: Never Used  Substance and Sexual Activity  . Alcohol use: No    Alcohol/week: 0.0 standard drinks  . Drug use: No  . Sexual activity: Never  Other Topics Concern  . Not on file  Social History Narrative   Lives with family at home   Social Determinants of Health   Financial Resource Strain: Low Risk   . Difficulty of Paying Living Expenses: Not hard at all  Food Insecurity: No Food Insecurity  . Worried About Running  Out of Food in the Last Year: Never true  . Ran Out of Food in the Last Year: Never true  Transportation Needs: No Transportation Needs  . Lack of Transportation (Medical): No  . Lack of Transportation (Non-Medical): No  Physical Activity:   . Days of Exercise per Week:   . Minutes of Exercise per Session:   Stress: No Stress Concern Present  . Feeling of Stress : Not at all  Social Connections: Unknown  . Frequency of Communication with Friends and Family: More than three times a week  . Frequency of Social Gatherings with Friends and Family: More than three times a week  . Attends Religious Services: Not on file  . Active Member of Clubs or Organizations: Yes  . Attends  Archivist Meetings: Not on file  . Marital Status: Not on file    Outpatient Encounter Medications as of 01/27/2020  Medication Sig  . acetaminophen (TYLENOL) 325 MG tablet Take 2 tablets (650 mg total) by mouth every 6 (six) hours as needed for mild pain (or Fever >/= 101).  Marland Kitchen albuterol (PROAIR HFA) 108 (90 Base) MCG/ACT inhaler USE 2 INHALATIONS EVERY 6 HOURS AS NEEDED FOR WHEEZING OR SHORTNESS OF BREATH  . alendronate (FOSAMAX) 70 MG tablet Take 70 mg by mouth every Monday.   . ALPRAZolam (XANAX) 0.25 MG tablet Take 1 tablet (0.25 mg total) by mouth 2 (two) times daily as needed for anxiety.  Marland Kitchen apixaban (ELIQUIS) 5 MG TABS tablet Take 5 mg by mouth 2 (two) times daily.  . Calcium Carbonate-Vitamin D (CALCIUM 600+D) 600-400 MG-UNIT tablet Take 1 tablet by mouth 2 (two) times daily.   . cetirizine (ZYRTEC) 10 MG tablet TAKE 1 TABLET DAILY  . cholecalciferol (VITAMIN D) 1000 units tablet Take 1,000 Units by mouth daily.  . colestipol (COLESTID) 1 g tablet Take 2 g by mouth daily.   Marland Kitchen diltiazem (CARDIZEM CD) 240 MG 24 hr capsule Take 1 capsule (240 mg total) by mouth daily.  Marland Kitchen diltiazem (CARDIZEM) 30 MG tablet Take 30 mg by mouth 4 (four) times daily as needed (heartrate >100bpm).   . fluticasone (FLONASE) 50 MCG/ACT nasal spray Place 2 sprays into both nostrils daily.  . furosemide (LASIX) 40 MG tablet Take 40 mg by mouth daily.   Marland Kitchen gabapentin (NEURONTIN) 100 MG capsule Take 100 mg by mouth daily.  Marland Kitchen levofloxacin (LEVAQUIN) 500 MG tablet Take 1 tablet (500 mg total) by mouth daily.  Marland Kitchen lip balm (BLISTEX) OINT Apply 1 application topically as needed for lip care.  . meclizine (ANTIVERT) 25 MG tablet take 1/2 to 1 tablet every 8 hours if needed for VERTIGO (Patient taking differently: Take 12.5-25 mg by mouth 3 (three) times daily as needed for dizziness. )  . metoprolol succinate (TOPROL-XL) 25 MG 24 hr tablet Take 1 tablet (25 mg total) by mouth daily.  . Multiple Vitamin  (MULTIVITAMIN WITH MINERALS) TABS tablet Take 1 tablet by mouth daily.  . potassium chloride (MICRO-K) 10 MEQ CR capsule TAKE 2 CAPSULES DAILY  . predniSONE (DELTASONE) 5 MG tablet Take 1 tablet (5 mg total) by mouth daily with breakfast.  . Probiotic Product (ALIGN PO) Take 1 tablet by mouth daily.  . simvastatin (ZOCOR) 20 MG tablet TAKE 1 TABLET DAILY   No facility-administered encounter medications on file as of 01/27/2020.    Review of Systems  Constitutional: Negative for appetite change and unexpected weight change.  HENT: Positive for congestion, postnasal drip and sinus pressure.  Respiratory: Negative for cough, chest tightness and shortness of breath.   Cardiovascular: Negative for chest pain, palpitations and leg swelling.  Gastrointestinal: Negative for abdominal pain, diarrhea, nausea and vomiting.  Genitourinary: Negative for difficulty urinating and dysuria.  Musculoskeletal: Negative for myalgias.       Right ankle swelling and pain.   Skin: Negative for color change and rash.  Neurological: Negative for dizziness, light-headedness and headaches.  Psychiatric/Behavioral: Negative for agitation and dysphoric mood.       Objective:    Physical Exam Vitals reviewed.  Constitutional:      General: She is not in acute distress.    Appearance: Normal appearance.  HENT:     Head: Normocephalic and atraumatic.     Right Ear: External ear normal.     Left Ear: External ear normal.  Eyes:     General: No scleral icterus.       Right eye: No discharge.        Left eye: No discharge.     Conjunctiva/sclera: Conjunctivae normal.  Neck:     Thyroid: No thyromegaly.  Cardiovascular:     Rate and Rhythm: Normal rate and regular rhythm.  Pulmonary:     Effort: No respiratory distress.     Breath sounds: Normal breath sounds. No wheezing.  Abdominal:     General: Bowel sounds are normal.     Palpations: Abdomen is soft.     Tenderness: There is no abdominal  tenderness.  Musculoskeletal:        General: No tenderness.     Cervical back: Neck supple. No tenderness.     Comments: Increased soft tissue swelling - right ankle.  No calf tenderness or swelling.  No increased erythema.    Lymphadenopathy:     Cervical: No cervical adenopathy.  Skin:    Findings: No erythema or rash.     Comments: Palpable soft tissue mass - right groin.  Non tender.  (4x4cm).    Neurological:     Mental Status: She is alert.  Psychiatric:        Mood and Affect: Mood normal.        Behavior: Behavior normal.     BP 122/60   Pulse 60   Temp 98.4 F (36.9 C)   Resp 16   Ht 5\' 5"  (1.651 m)   Wt 137 lb 3.2 oz (62.2 kg)   SpO2 97%   BMI 22.83 kg/m  Wt Readings from Last 3 Encounters:  01/27/20 137 lb 3.2 oz (62.2 kg)  10/24/19 136 lb 3.2 oz (61.8 kg)  08/08/19 133 lb (60.3 kg)     Lab Results  Component Value Date   WBC 8.0 10/22/2019   HGB 11.9 (L) 10/22/2019   HCT 34.7 (L) 10/22/2019   PLT 300.0 10/22/2019   GLUCOSE 109 (H) 10/22/2019   CHOL 150 10/22/2019   TRIG 117.0 10/22/2019   HDL 65.10 10/22/2019   LDLDIRECT 73.7 08/20/2013   LDLCALC 61 10/22/2019   ALT 14 10/22/2019   AST 26 10/22/2019   NA 137 10/22/2019   K 3.5 10/22/2019   CL 99 10/22/2019   CREATININE 0.94 10/22/2019   BUN 11 10/22/2019   CO2 30 10/22/2019   TSH 2.02 05/01/2019   INR 1.4 (H) 04/03/2019   HGBA1C 5.8 10/22/2019    MM 3D SCREEN BREAST BILATERAL  Result Date: 11/19/2019 CLINICAL DATA:  Screening. EXAM: DIGITAL SCREENING BILATERAL MAMMOGRAM WITH TOMO AND CAD COMPARISON:  Previous exam(s). ACR Breast Density  Category b: There are scattered areas of fibroglandular density. FINDINGS: There are no findings suspicious for malignancy. Images were processed with CAD. IMPRESSION: No mammographic evidence of malignancy. A result letter of this screening mammogram will be mailed directly to the patient. RECOMMENDATION: Screening mammogram in one year. (Code:SM-B-01Y)  BI-RADS CATEGORY  1: Negative. Electronically Signed   By: Kristopher Oppenheim M.D.   On: 11/19/2019 16:05       Assessment & Plan:   Problem List Items Addressed This Visit    A-fib ([place])    History of afib.  On diltiazem and metoprolol.  Followed by cardiology.        Anemia    Follow cbc.       Relevant Orders   CBC with Differential/Platelet   IBC + Ferritin   Anxiety    No increased anxiety reported today.  Follow.        Fatty liver    Has been followed by GI.  Follow liver function tests.       History of colonic polyps    Colonoscopy 06/2015.  Recommended f/u colonoscopy in 06/2020.        Hypercholesterolemia    On simvastatin.  Low cholesterol diet and exercise.  Follow lipid panel and liver function tests.        Relevant Orders   Hepatic function panel   Lipid panel   Hyperglycemia    Follow met b and a1c.       Relevant Orders   Hemoglobin A1c   Hypertension    On diltiazem and lasix.  Blood pressures doing well.  Follow.  118/60 - rechecked by me.       Relevant Orders   Basic metabolic panel   Nodule of groin    Palpable soft tissue mass - right upper thigh/groin.   Obtain ultrasound.        Restless leg syndrome    On gabapentin.  No problems reported today.        Rheumatoid arthritis ([place])    Followed by Dr [name].  Ankle pain and swelling.        Sinusitis    Persistent and progressing symptoms despite flonase, saline nasal spray and robitussin.  Feels like her typical sinus infections.  Continue flonase, saline nasal spray and robitussin.  levaquin as directed.  Probiotics as directed.  Follow.  Notify me if symptoms worsen or do not resolve.        Relevant Medications   levofloxacin (LEVAQUIN) 500 MG tablet       [name], MD

## 2020-02-02 ENCOUNTER — Encounter: Payer: Self-pay | Admitting: Internal Medicine

## 2020-02-02 DIAGNOSIS — R739 Hyperglycemia, unspecified: Secondary | ICD-10-CM | POA: Insufficient documentation

## 2020-02-02 DIAGNOSIS — J329 Chronic sinusitis, unspecified: Secondary | ICD-10-CM | POA: Insufficient documentation

## 2020-02-02 NOTE — Assessment & Plan Note (Signed)
Followed by Dr [name].  Ankle pain and swelling.

## 2020-02-02 NOTE — Assessment & Plan Note (Signed)
History of afib.  On diltiazem and metoprolol.  Followed by cardiology.   

## 2020-02-02 NOTE — Assessment & Plan Note (Signed)
Palpable soft tissue mass - right upper thigh/groin.   Obtain ultrasound.

## 2020-02-02 NOTE — Assessment & Plan Note (Signed)
Follow cbc.  

## 2020-02-02 NOTE — Assessment & Plan Note (Signed)
On simvastatin.  Low cholesterol diet and exercise.  Follow lipid panel and liver function tests.   

## 2020-02-02 NOTE — Assessment & Plan Note (Signed)
On gabapentin.  No problems reported today.   °

## 2020-02-02 NOTE — Assessment & Plan Note (Signed)
Persistent and progressing symptoms despite flonase, saline nasal spray and robitussin.  Feels like her typical sinus infections.  Continue flonase, saline nasal spray and robitussin.  levaquin as directed.  Probiotics as directed.  Follow.  Notify me if symptoms worsen or do not resolve.

## 2020-02-02 NOTE — Assessment & Plan Note (Signed)
Colonoscopy 06/2015.  Recommended f/u colonoscopy in 06/2020.

## 2020-02-02 NOTE — Assessment & Plan Note (Signed)
On diltiazem and lasix.  Blood pressures doing well.  Follow.  118/60 - rechecked by me.

## 2020-02-02 NOTE — Assessment & Plan Note (Signed)
Follow met b and a1c.  

## 2020-02-02 NOTE — Assessment & Plan Note (Signed)
No increased anxiety reported today.  Follow.

## 2020-02-02 NOTE — Assessment & Plan Note (Signed)
Has been followed by GI.  Follow liver function tests.  

## 2020-02-14 ENCOUNTER — Telehealth: Payer: Self-pay | Admitting: Internal Medicine

## 2020-02-14 NOTE — Telephone Encounter (Signed)
Pt needs a refill on levofloxacin (LEVAQUIN) 500 MG tablet sent to [facility]

## 2020-02-17 NOTE — Telephone Encounter (Signed)
Spoke with pt and she stated that she does not need a refill on the levaquin, she had one on hand at home she forgot about. Pt stated that the reason she thought she needed one was because she was going to the dentist today.

## 2020-02-25 ENCOUNTER — Other Ambulatory Visit: Payer: Self-pay | Admitting: Internal Medicine

## 2020-02-27 DIAGNOSIS — M05731 Rheumatoid arthritis with rheumatoid factor of right wrist without organ or systems involvement: Secondary | ICD-10-CM | POA: Diagnosis not present

## 2020-02-27 DIAGNOSIS — M0579 Rheumatoid arthritis with rheumatoid factor of multiple sites without organ or systems involvement: Secondary | ICD-10-CM | POA: Diagnosis not present

## 2020-02-27 DIAGNOSIS — M05732 Rheumatoid arthritis with rheumatoid factor of left wrist without organ or systems involvement: Secondary | ICD-10-CM | POA: Diagnosis not present

## 2020-03-23 ENCOUNTER — Telehealth: Payer: Self-pay | Admitting: Internal Medicine

## 2020-03-23 MED ORDER — FLUTICASONE PROPIONATE 50 MCG/ACT NA SUSP: 2.0000 | Freq: Every day | NASAL | 1 refills | Status: DC

## 2020-03-23 NOTE — Telephone Encounter (Signed)
Patient is requesting a refill on her fluticasone (FLONASE) 50 MCG/ACT nasal spray

## 2020-04-10 ENCOUNTER — Other Ambulatory Visit: Payer: Self-pay | Admitting: Internal Medicine

## 2020-04-21 DIAGNOSIS — M0579 Rheumatoid arthritis with rheumatoid factor of multiple sites without organ or systems involvement: Secondary | ICD-10-CM | POA: Diagnosis not present

## 2020-04-30 DIAGNOSIS — I480 Paroxysmal atrial fibrillation: Secondary | ICD-10-CM | POA: Diagnosis not present

## 2020-04-30 DIAGNOSIS — I059 Rheumatic mitral valve disease, unspecified: Secondary | ICD-10-CM | POA: Diagnosis not present

## 2020-04-30 DIAGNOSIS — I10 Essential (primary) hypertension: Secondary | ICD-10-CM | POA: Diagnosis not present

## 2020-04-30 DIAGNOSIS — G4733 Obstructive sleep apnea (adult) (pediatric): Secondary | ICD-10-CM | POA: Diagnosis not present

## 2020-04-30 DIAGNOSIS — J441 Chronic obstructive pulmonary disease with (acute) exacerbation: Secondary | ICD-10-CM | POA: Diagnosis not present

## 2020-04-30 DIAGNOSIS — J9601 Acute respiratory failure with hypoxia: Secondary | ICD-10-CM | POA: Diagnosis not present

## 2020-06-10 ENCOUNTER — Other Ambulatory Visit: Payer: Self-pay | Admitting: Internal Medicine

## 2020-06-16 DIAGNOSIS — M0579 Rheumatoid arthritis with rheumatoid factor of multiple sites without organ or systems involvement: Secondary | ICD-10-CM | POA: Diagnosis not present

## 2020-07-08 ENCOUNTER — Ambulatory Visit: Payer: Medicare Other

## 2020-07-21 ENCOUNTER — Other Ambulatory Visit: Payer: Self-pay | Admitting: Internal Medicine

## 2020-08-05 DIAGNOSIS — I739 Peripheral vascular disease, unspecified: Secondary | ICD-10-CM | POA: Diagnosis not present

## 2020-08-05 DIAGNOSIS — E782 Mixed hyperlipidemia: Secondary | ICD-10-CM | POA: Diagnosis not present

## 2020-08-05 DIAGNOSIS — J441 Chronic obstructive pulmonary disease with (acute) exacerbation: Secondary | ICD-10-CM | POA: Diagnosis not present

## 2020-08-05 DIAGNOSIS — I38 Endocarditis, valve unspecified: Secondary | ICD-10-CM | POA: Diagnosis not present

## 2020-08-05 DIAGNOSIS — I480 Paroxysmal atrial fibrillation: Secondary | ICD-10-CM | POA: Diagnosis not present

## 2020-08-05 DIAGNOSIS — I10 Essential (primary) hypertension: Secondary | ICD-10-CM | POA: Diagnosis not present

## 2020-08-05 DIAGNOSIS — I059 Rheumatic mitral valve disease, unspecified: Secondary | ICD-10-CM | POA: Diagnosis not present

## 2020-08-10 ENCOUNTER — Ambulatory Visit: Payer: Medicare Other

## 2020-08-11 DIAGNOSIS — M0579 Rheumatoid arthritis with rheumatoid factor of multiple sites without organ or systems involvement: Secondary | ICD-10-CM | POA: Diagnosis not present

## 2020-08-14 ENCOUNTER — Ambulatory Visit (INDEPENDENT_AMBULATORY_CARE_PROVIDER_SITE_OTHER): Payer: Medicare Other

## 2020-08-14 VITALS — Ht 65.0 in | Wt 137.0 lb

## 2020-08-14 DIAGNOSIS — Z0000 Encounter for general adult medical examination without abnormal findings: Secondary | ICD-10-CM | POA: Diagnosis not present

## 2020-08-14 NOTE — Progress Notes (Signed)
Subjective:   Kaitlyn Huff is a 80 y.o. female who presents for Medicare Annual (Subsequent) preventive examination.  Review of Systems    No ROS.  Medicare Wellness Virtual Visit.   Cardiac Risk Factors include: advanced age (>60men, >16 women);hypertension     Objective:    Today's Vitals   08/14/20 1033  Weight: 137 lb (62.1 kg)  Height: 5\' 5"  (1.651 m)   Body mass index is 22.8 kg/m.  Advanced Directives 08/14/2020 08/08/2019 04/03/2019 04/03/2019 03/29/2019 02/27/2019 02/27/2019  Does Patient Have a Medical Advance Directive? Yes Yes No No No No No  Type of Paramedic of [place];Living will [place];Living will - - - - -  Does patient want to make changes to medical advance directive? No - Patient declined No - Patient declined - - - - -  Copy of [place] in Chart? Yes - validated most recent copy scanned in chart (See row information) Yes - validated most recent copy scanned in chart (See row information) - - - - -  Would patient like information on creating a medical advance directive? - - Yes (Inpatient - patient requests chaplain consult to create a medical advance directive) - - No - Patient declined -    Current Medications (verified) Outpatient Encounter Medications as of 08/14/2020  Medication Sig  . acetaminophen (TYLENOL) 325 MG tablet Take 2 tablets (650 mg total) by mouth every 6 (six) hours as needed for mild pain (or Fever >/= 101).  Marland Kitchen albuterol (PROAIR HFA) 108 (90 Base) MCG/ACT inhaler USE 2 INHALATIONS EVERY 6 HOURS AS NEEDED FOR WHEEZING OR SHORTNESS OF BREATH  . alendronate (FOSAMAX) 70 MG tablet Take 70 mg by mouth every Monday.   . ALPRAZolam (XANAX) 0.25 MG tablet Take 1 tablet (0.25 mg total) by mouth 2 (two) times daily as needed for anxiety.  Marland Kitchen apixaban (ELIQUIS) 5 MG TABS tablet Take 5 mg by mouth 2 (two) times daily.  . Calcium Carbonate-Vitamin D (CALCIUM 600+D) 600-400 MG-UNIT tablet Take  1 tablet by mouth 2 (two) times daily.   . cetirizine (ZYRTEC) 10 MG tablet TAKE 1 TABLET DAILY  . cholecalciferol (VITAMIN D) 1000 units tablet Take 1,000 Units by mouth daily.  . colestipol (COLESTID) 1 g tablet Take 2 g by mouth daily.   Marland Kitchen diltiazem (CARDIZEM CD) 240 MG 24 hr capsule Take 1 capsule (240 mg total) by mouth daily.  . fluticasone (FLONASE) 50 MCG/ACT nasal spray USE 2 SPRAYS IN EACH NOSTRIL DAILY (CALL OFFICE SOON TO RESCHEDULE YOUR PHYSICAL)  . furosemide (LASIX) 40 MG tablet Take 40 mg by mouth daily.   Marland Kitchen gabapentin (NEURONTIN) 100 MG capsule Take 100 mg by mouth daily.  Marland Kitchen levofloxacin (LEVAQUIN) 500 MG tablet Take 1 tablet (500 mg total) by mouth daily.  Marland Kitchen lip balm (BLISTEX) OINT Apply 1 application topically as needed for lip care.  . meclizine (ANTIVERT) 25 MG tablet take 1/2 to 1 tablet every 8 hours if needed for VERTIGO (Patient taking differently: Take 12.5-25 mg by mouth 3 (three) times daily as needed for dizziness. )  . metoprolol succinate (TOPROL-XL) 25 MG 24 hr tablet Take 1 tablet (25 mg total) by mouth daily.  . Multiple Vitamin (MULTIVITAMIN WITH MINERALS) TABS tablet Take 1 tablet by mouth daily.  . potassium chloride (MICRO-K) 10 MEQ CR capsule TAKE 2 CAPSULES DAILY  . predniSONE (DELTASONE) 5 MG tablet Take 1 tablet (5 mg total) by mouth daily with  breakfast.  . Probiotic Product (ALIGN PO) Take 1 tablet by mouth daily.  . simvastatin (ZOCOR) 20 MG tablet TAKE 1 TABLET DAILY  . spironolactone (ALDACTONE) 25 MG tablet Take by mouth.   No facility-administered encounter medications on file as of 08/14/2020.    Allergies (verified) Tramadol, Astelin [azelastine hcl], Codeine, Dilaudid [hydromorphone], Flexeril [cyclobenzaprine], Imuran [azathioprine], Lisinopril, Lyrica [pregabalin], Methotrexate derivatives, Amiodarone, Amoxicillin, Arava [leflunomide], Clindamycin/lincomycin, Doxycycline, Lodine [etodolac], Percocet [oxycodone-acetaminophen], and Sulfa  antibiotics   History: Past Medical History:  Diagnosis Date  . Anemia   . Cancer (HCC)   . Diverticulitis   . GERD (gastroesophageal reflux disease)   . Hyperlipidemia   . Hypertension   . Osteoarthritis   . Osteoporosis    actonel  . Positive PPD    s/p INH (2006)  . Rheumatoid arthritis(714.0)    MTX transaminitis, Leflunomide (rash), enbrel, plaquinil, prednisone, remicade, Imuran (transaminitis)  . 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 ABDOMINAL HYSTERECTOMY    . CERVICAL CONE BIOPSY    . CHOLECYSTECTOMY  06/22/14  . COLONOSCOPY WITH PROPOFOL N/A 07/09/2015   Procedure: COLONOSCOPY WITH PROPOFOL;  Surgeon: Scot Jun, MD;  Location: [facility] END[facility]COPY;  Service: Endoscopy;  Laterality: N/A;  . ESOPHAGOGASTRODUODENOSCOPY (EGD) WITH PROPOFOL N/A 07/09/2015   Procedure: ESOPHAGOGASTRODUODENOSCOPY (EGD) WITH PROPOFOL;  Surgeon: Scot Jun, MD;  Location: [facility] ENDOSCOPY;  Service: Endoscopy;  Laterality: N/A;  . OVARY SURGERY    . TRACHEOSTOMY  1959  . TUBAL LIGATION     Family History  Problem Relation Age of Onset  . Heart disease Father        MI  . Heart disease Mother   . Valvular heart disease Mother   . Breast cancer Sister 13  . Cancer Sister        Lung cancer  . COPD Sister   . Heart disease Sister   . Colon cancer Neg Hx    Social History   Socioeconomic History  . Marital status: Widowed    Spouse name: Not on file  . Number of children: 4  . Years of education: Not on file  . Highest education level: Not on file  Occupational History  . Occupation: retired  Tobacco Use  . Smoking status: Never Smoker  . Smokeless tobacco: Never Used  Substance and Sexual Activity  . Alcohol use: No    Alcohol/week: 0.0 standard drinks  . Drug use: No  . Sexual activity: Never  Other Topics Concern  . Not on file  Social History Narrative   Lives with family at home   Social  Determinants of Health   Financial Resource Strain: Low Risk   . Difficulty of Paying Living Expenses: Not hard at all  Food Insecurity: No Food Insecurity  . Worried About Programme researcher, broadcasting/film/video in the Last Year: Never true  . Ran Out of Food in the Last Year: Never true  Transportation Needs: No Transportation Needs  . Lack of Transportation (Medical): No  . Lack of Transportation (Non-Medical): No  Physical Activity: Not on file  Stress: No Stress Concern Present  . Feeling of Stress : Not at all  Social Connections: Unknown  . Frequency of Communication with Friends and Family: More than three times a week  . Frequency of Social Gatherings with Friends and Family: More than three times a week  . Attends Religious Services: Not on file  .  Active Member of Clubs or Organizations: Yes  . Attends Archivist Meetings: Not on file  . Marital Status: Not on file    Tobacco Counseling Counseling given: Not Answered   Clinical Intake:  Pre-visit preparation completed: Yes        Diabetes: No  How often do you need to have someone help you when you read instructions, pamphlets, or other written materials from your doctor or pharmacy?: 1 - Never   Interpreter Needed?: No      Activities of Daily Living In your present state of health, do you have any difficulty performing the following activities: 08/14/2020  Hearing? N  Vision? N  Difficulty concentrating or making decisions? N  Walking or climbing stairs? N  Dressing or bathing? N  Doing errands, shopping? N  Preparing Food and eating ? N  Using the Toilet? N  In the past six months, have you accidently leaked urine? N  Do you have problems with loss of bowel control? N  Managing your Medications? N  Managing your Finances? N  Housekeeping or managing your Housekeeping? N  Some recent data might be hidden    Patient Care Team: [name], MD as PCP - General (Internal Medicine)  Indicate any  recent Medical Services you may have received from other than [facility] providers in the past year (date may be approximate).     Assessment:   This is a routine wellness examination for Kaitlyn Huff.  I connected with Kaitlyn Huff today by telephone and verified that I am speaking with the correct person using two identifiers. Location patient: home Location provider: work Persons participating in the virtual visit: patient, Marine scientist.    I discussed the limitations, risks, security and privacy concerns of performing an evaluation and management service by telephone and the availability of in person appointments. The patient expressed understanding and verbally consented to this telephonic visit.    Interactive audio and video telecommunications were attempted between this provider and patient, however failed, due to patient having technical difficulties OR patient did not have access to video capability.  We continued and completed visit with audio only.  Some vital signs may be absent or patient reported.   Hearing/Vision screen  Hearing Screening   125Hz  250Hz  500Hz  1000Hz  2000Hz  3000Hz  4000Hz  6000Hz  8000Hz   Right ear:           Left ear:           Comments: Patient is able to hear conversational tones without difficulty.  No issues reported.  Vision Screening Comments: Wears corrective lenses Visual acuity not assessed, virtual visit.  They have seen their ophthalmologist in the last 12 months.     Dietary issues and exercise activities discussed: Current Exercise Habits: Home exercise routine, Time (Minutes): 10, Frequency (Times/Week): 5, Weekly Exercise (Minutes/Week): 50, Intensity: Mild  Healthy diet Good water intake  Goals      Patient Stated   .  Healthy Lifestyle (pt-stated)      Stay active       Depression Screen PHQ 2/9 Scores 08/14/2020 10/24/2019 08/08/2019 08/03/2018 12/21/2017 07/14/2017 06/06/2016  PHQ - 2 Score 0 0 0 0 0 0 0  PHQ- 9 Score - - - - - - -    Fall Risk Fall  Risk  08/14/2020 10/24/2019 08/08/2019 08/03/2018 12/21/2017  Falls in the past year? 0 0 0 0 No  Number falls in past yr: 0 0 - - -  Injury with Fall? 0 - - - -  Risk for fall due to : - - - - -  Risk for fall due to: Comment - - - - -  Follow up Falls evaluation completed Falls evaluation completed Falls evaluation completed - -    FALL RISK PREVENTION PERTAINING TO THE HOME: Handrails in use when climbing stairs? Yes Home free of loose throw rugs in walkways, pet beds, electrical cords, etc? Yes  Adequate lighting in your home to reduce risk of falls? Yes   ASSISTIVE DEVICES UTILIZED TO PREVENT FALLS: Use of a cane, walker or w/c? No   TIMED UP AND GO: Was the test performed? No . Virtual visit.  Cognitive Function: Patient is alert and oriented x3.  Denies difficulty focusing, making decisions, memory loss.  MMSE/6CIT deferred. Normal by direct communication/observation.  MMSE - Mini Mental State Exam 05/12/2016  Orientation to time 5  Orientation to Place 5  Registration 3  Attention/ Calculation 5  Recall 3  Language- name 2 objects 2  Language- repeat 1  Language- follow 3 step command 3  Language- read & follow direction 1  Write a sentence 1  Copy design 1  Total score 30     6CIT Screen 08/14/2020 08/08/2019 08/03/2018 07/14/2017  What Year? 0 points 0 points 0 points 0 points  What month? 0 points 0 points 0 points 0 points  What time? 0 points 0 points 0 points 0 points  Count back from 20 0 points 0 points 0 points 0 points  Months in reverse 0 points 0 points 0 points 0 points  Repeat phrase - 0 points 0 points 0 points  Total Score - 0 0 0    Immunizations Immunization History  Administered Date(s) Administered  . Fluad Quad(high Dose 65+) 04/05/2019  . Influenza Split 05/17/2012, 03/03/2014  . Influenza, High Dose Seasonal PF 02/25/2017, 03/29/2018  . Influenza,inj,Quad PF,6+ Mos 03/25/2013, 08/05/2015  . Influenza-Unspecified 03/25/2013  . Pneumococcal  Conjugate-13 05/21/2018  . Pneumococcal Polysaccharide-23 02/24/2017   Health Maintenance Health Maintenance  Topic Date Due  . INFLUENZA VACCINE  10/08/2020 (Originally 02/09/2020)  . COLONOSCOPY (Pts 45-22yrs Insurance coverage will need to be confirmed)  08/14/2021 (Originally 07/08/2020)  . TETANUS/TDAP  08/14/2021 (Originally 01/20/2019)  . DEXA SCAN  Completed  . Hepatitis C Screening  Completed  . PNA vac Low Risk Adult  Completed  . COVID-19 Vaccine  Discontinued   Colonoscopy- deferred per patient.   Mammogram- 11/19/19. birads I.   Lung Cancer Screening: (Low Dose CT Chest recommended if Age 29-80 years, 30 pack-year currently smoking OR have quit w/in 15years.) does not qualify.   Hepatitis C Screening: 03/29/18  Covid vaccine- deferred per patient.  Influenza vaccine-deferred per patient.   Tdap vaccine-deferred per patient.   Vision Screening: Recommended annual ophthalmology exams for early detection of glaucoma and other disorders of the eye. Is the patient up to date with their annual eye exam?  Yes   Dental Screening: Recommended annual dental exams for proper oral hygiene  Community Resource Referral / Chronic Care Management: CRR required this visit?  No   CCM required this visit?  No      Plan:    I have personally reviewed and noted the following in the patient's chart:   . Medical and social history . Use of alcohol, tobacco or illicit drugs  . Current medications and supplements . Functional ability and status . Nutritional status . Physical activity . Advanced directives . List of other physicians . Hospitalizations, surgeries, and ER  visits in previous 12 months . Vitals . Screenings to include cognitive, depression, and falls . Referrals and appointments  In addition, I have reviewed and discussed with patient certain preventive protocols, quality metrics, and best practice recommendations. A written personalized care plan for preventive  services as well as general preventive health recommendations were provided to patient via mail.     Varney Biles, LPN   [id number]

## 2020-08-14 NOTE — Patient Instructions (Addendum)
Kaitlyn Huff , Thank you for taking time to come for your Medicare Wellness Visit. I appreciate your ongoing commitment to your health goals. Please review the following plan we discussed and let me know if I can assist you in the future.   These are the goals we discussed: Goals      Patient Stated   .  Healthy Lifestyle (pt-stated)      Stay active        This is a list of the screening recommended for you and due dates:  Health Maintenance  Topic Date Due  . Flu Shot  10/08/2020*  . Colon Cancer Screening  08/14/2021*  . Tetanus Vaccine  08/14/2021*  . DEXA scan (bone density measurement)  Completed  .  Hepatitis C: One time screening is recommended by Center for Disease Control  (CDC) for  adults born from [birth year] through 1963.   Completed  . Pneumonia vaccines  Completed  . COVID-19 Vaccine  Discontinued  *Topic was postponed. The date shown is not the original due date.    Immunizations Immunization History  Administered Date(s) Administered  . Fluad Quad(high Dose 65+) 04/05/2019  . Influenza Split 05/17/2012, 03/03/2014  . Influenza, High Dose Seasonal PF 02/25/2017, 03/29/2018  . Influenza,inj,Quad PF,6+ Mos 03/25/2013, 08/05/2015  . Influenza-Unspecified 03/25/2013  . Pneumococcal Conjugate-13 05/21/2018  . Pneumococcal Polysaccharide-23 02/24/2017   Advanced directives: on file  Conditions/risks identified: none   Follow up in one year for your annual wellness visit.   Preventive Care 18 Years and Older, Female Preventive care refers to lifestyle choices and visits with your health care provider that can promote health and wellness. What does preventive care include?  A yearly physical exam. This is also called an annual well check.  Dental exams once or twice a year.  Routine eye exams. Ask your health care provider how often you should have your eyes checked.  Personal lifestyle choices, including:  Daily care of your teeth and gums.  Regular  physical activity.  Eating a healthy diet.  Avoiding tobacco and drug use.  Limiting alcohol use.  Practicing safe sex.  Taking low-dose aspirin every day.  Taking vitamin and mineral supplements as recommended by your health care provider. What happens during an annual well check? The services and screenings done by your health care provider during your annual well check will depend on your age, overall health, lifestyle risk factors, and family history of disease. Counseling  Your health care provider may ask you questions about your:  Alcohol use.  Tobacco use.  Drug use.  Emotional well-being.  Home and relationship well-being.  Sexual activity.  Eating habits.  History of falls.  Memory and ability to understand (cognition).  Work and work Statistician.  Reproductive health. Screening  You may have the following tests or measurements:  Height, weight, and BMI.  Blood pressure.  Lipid and cholesterol levels. These may be checked every 5 years, or more frequently if you are over 28 years old.  Skin check.  Lung cancer screening. You may have this screening every year starting at age 65 if you have a 30-pack-year history of smoking and currently smoke or have quit within the past 15 years.  Fecal occult blood test (FOBT) of the stool. You may have this test every year starting at age 87.  Flexible sigmoidoscopy or colonoscopy. You may have a sigmoidoscopy every 5 years or a colonoscopy every 10 years starting at age 32.  Hepatitis C blood test.  Hepatitis B blood test.  Sexually transmitted disease (STD) testing.  Diabetes screening. This is done by checking your blood sugar (glucose) after you have not eaten for a while (fasting). You may have this done every 1-3 years.  Bone density scan. This is done to screen for osteoporosis. You may have this done starting at age 19.  Mammogram. This may be done every 1-2 years. Talk to your health care  provider about how often you should have regular mammograms. Talk with your health care provider about your test results, treatment options, and if necessary, the need for more tests. Vaccines  Your health care provider may recommend certain vaccines, such as:  Influenza vaccine. This is recommended every year.  Tetanus, diphtheria, and acellular pertussis (Tdap, Td) vaccine. You may need a Td booster every 10 years.  Zoster vaccine. You may need this after age 44.  Pneumococcal 13-valent conjugate (PCV13) vaccine. One dose is recommended after age 17.  Pneumococcal polysaccharide (PPSV23) vaccine. One dose is recommended after age 66. Talk to your health care provider about which screenings and vaccines you need and how often you need them. This information is not intended to replace advice given to you by your health care provider. Make sure you discuss any questions you have with your health care provider. Document Released: 07/24/2015 Document Revised: 03/16/2016 Document Reviewed: 04/28/2015 Elsevier Interactive Patient Education  2017 [employer].  Fall Prevention in the Home Falls can cause injuries. They can happen to people of all ages. There are many things you can do to make your home safe and to help prevent falls. What can I do on the outside of my home?  Regularly fix the edges of walkways and driveways and fix any cracks.  Remove anything that might make you trip as you walk through a door, such as a raised step or threshold.  Trim any bushes or trees on the path to your home.  Use bright outdoor lighting.  Clear any walking paths of anything that might make someone trip, such as rocks or tools.  Regularly check to see if handrails are loose or broken. Make sure that both sides of any steps have handrails.  Any raised decks and porches should have guardrails on the edges.  Have any leaves, snow, or ice cleared regularly.  Use sand or salt on walking paths  during winter.  Clean up any spills in your garage right away. This includes oil or grease spills. What can I do in the bathroom?  Use night lights.  Install grab bars by the toilet and in the tub and shower. Do not use towel bars as grab bars.  Use non-skid mats or decals in the tub or shower.  If you need to sit down in the shower, use a plastic, non-slip stool.  Keep the floor dry. Clean up any water that spills on the floor as soon as it happens.  Remove soap buildup in the tub or shower regularly.  Attach bath mats securely with double-sided non-slip rug tape.  Do not have throw rugs and other things on the floor that can make you trip. What can I do in the bedroom?  Use night lights.  Make sure that you have a light by your bed that is easy to reach.  Do not use any sheets or blankets that are too big for your bed. They should not hang down onto the floor.  Have a firm chair that has side arms. You can use this  for support while you get dressed.  Do not have throw rugs and other things on the floor that can make you trip. What can I do in the kitchen?  Clean up any spills right away.  Avoid walking on wet floors.  Keep items that you use a lot in easy-to-reach places.  If you need to reach something above you, use a strong step stool that has a grab bar.  Keep electrical cords out of the way.  Do not use floor polish or wax that makes floors slippery. If you must use wax, use non-skid floor wax.  Do not have throw rugs and other things on the floor that can make you trip. What can I do with my stairs?  Do not leave any items on the stairs.  Make sure that there are handrails on both sides of the stairs and use them. Fix handrails that are broken or loose. Make sure that handrails are as long as the stairways.  Check any carpeting to make sure that it is firmly attached to the stairs. Fix any carpet that is loose or worn.  Avoid having throw rugs at the top  or bottom of the stairs. If you do have throw rugs, attach them to the floor with carpet tape.  Make sure that you have a light switch at the top of the stairs and the bottom of the stairs. If you do not have them, ask someone to add them for you. What else can I do to help prevent falls?  Wear shoes that:  Do not have high heels.  Have rubber bottoms.  Are comfortable and fit you well.  Are closed at the toe. Do not wear sandals.  If you use a stepladder:  Make sure that it is fully opened. Do not climb a closed stepladder.  Make sure that both sides of the stepladder are locked into place.  Ask someone to hold it for you, if possible.  Clearly mark and make sure that you can see:  Any grab bars or handrails.  First and last steps.  Where the edge of each step is.  Use tools that help you move around (mobility aids) if they are needed. These include:  Canes.  Walkers.  Scooters.  Crutches.  Turn on the lights when you go into a dark area. Replace any light bulbs as soon as they burn out.  Set up your furniture so you have a clear path. Avoid moving your furniture around.  If any of your floors are uneven, fix them.  If there are any pets around you, be aware of where they are.  Review your medicines with your doctor. Some medicines can make you feel dizzy. This can increase your chance of falling. Ask your doctor what other things that you can do to help prevent falls. This information is not intended to replace advice given to you by your health care provider. Make sure you discuss any questions you have with your health care provider. Document Released: 04/23/2009 Document Revised: 12/03/2015 Document Reviewed: 08/01/2014 Elsevier Interactive Patient Education  2017 [employer].

## 2020-09-18 DIAGNOSIS — M79621 Pain in right upper arm: Secondary | ICD-10-CM | POA: Diagnosis not present

## 2020-09-18 DIAGNOSIS — R0781 Pleurodynia: Secondary | ICD-10-CM | POA: Diagnosis not present

## 2020-09-18 DIAGNOSIS — M791 Myalgia, unspecified site: Secondary | ICD-10-CM | POA: Diagnosis not present

## 2020-09-18 DIAGNOSIS — S2232XA Fracture of one rib, left side, initial encounter for closed fracture: Secondary | ICD-10-CM | POA: Diagnosis not present

## 2020-09-27 ENCOUNTER — Other Ambulatory Visit: Payer: Self-pay | Admitting: Internal Medicine

## 2020-09-30 ENCOUNTER — Telehealth: Payer: Self-pay | Admitting: Internal Medicine

## 2020-09-30 NOTE — Telephone Encounter (Signed)
Patient called in wanted to know what prescription  Dr.Scott called in for her she can not get it off her phone

## 2020-10-02 NOTE — Telephone Encounter (Signed)
Called patient to let her know this rx inform and there is nothing needed from her

## 2020-10-06 DIAGNOSIS — Z8739 Personal history of other diseases of the musculoskeletal system and connective tissue: Secondary | ICD-10-CM | POA: Diagnosis not present

## 2020-10-06 DIAGNOSIS — M0579 Rheumatoid arthritis with rheumatoid factor of multiple sites without organ or systems involvement: Secondary | ICD-10-CM | POA: Diagnosis not present

## 2020-10-06 DIAGNOSIS — M810 Age-related osteoporosis without current pathological fracture: Secondary | ICD-10-CM | POA: Diagnosis not present

## 2020-10-06 DIAGNOSIS — I479 Paroxysmal tachycardia, unspecified: Secondary | ICD-10-CM | POA: Diagnosis not present

## 2020-10-06 DIAGNOSIS — R609 Edema, unspecified: Secondary | ICD-10-CM | POA: Diagnosis not present

## 2020-11-22 ENCOUNTER — Observation Stay
Admission: EM | Admit: 2020-11-22 | Discharge: 2020-11-23 | Disposition: A | Discharge status: Home / Self Care | Payer: Medicare Other | Attending: Hospitalist | Admitting: Hospitalist

## 2020-11-22 ENCOUNTER — Other Ambulatory Visit: Payer: Self-pay

## 2020-11-22 DIAGNOSIS — R42 Dizziness and giddiness: Secondary | ICD-10-CM | POA: Diagnosis not present

## 2020-11-22 DIAGNOSIS — G479 Sleep disorder, unspecified: Secondary | ICD-10-CM | POA: Diagnosis present

## 2020-11-22 DIAGNOSIS — I10 Essential (primary) hypertension: Secondary | ICD-10-CM | POA: Diagnosis present

## 2020-11-22 DIAGNOSIS — I480 Paroxysmal atrial fibrillation: Secondary | ICD-10-CM | POA: Diagnosis present

## 2020-11-22 DIAGNOSIS — F419 Anxiety disorder, unspecified: Secondary | ICD-10-CM | POA: Diagnosis present

## 2020-11-22 DIAGNOSIS — K922 Gastrointestinal hemorrhage, unspecified: Secondary | ICD-10-CM | POA: Insufficient documentation

## 2020-11-22 DIAGNOSIS — E7800 Pure hypercholesterolemia, unspecified: Secondary | ICD-10-CM | POA: Diagnosis present

## 2020-11-22 DIAGNOSIS — R55 Syncope and collapse: Secondary | ICD-10-CM | POA: Diagnosis not present

## 2020-11-22 DIAGNOSIS — M47817 Spondylosis without myelopathy or radiculopathy, lumbosacral region: Secondary | ICD-10-CM | POA: Diagnosis present

## 2020-11-22 DIAGNOSIS — E039 Hypothyroidism, unspecified: Secondary | ICD-10-CM | POA: Diagnosis not present

## 2020-11-22 DIAGNOSIS — I4891 Unspecified atrial fibrillation: Secondary | ICD-10-CM | POA: Diagnosis not present

## 2020-11-22 DIAGNOSIS — R001 Bradycardia, unspecified: Secondary | ICD-10-CM

## 2020-11-22 DIAGNOSIS — G4733 Obstructive sleep apnea (adult) (pediatric): Secondary | ICD-10-CM | POA: Diagnosis present

## 2020-11-22 DIAGNOSIS — M1990 Unspecified osteoarthritis, unspecified site: Secondary | ICD-10-CM | POA: Diagnosis present

## 2020-11-22 DIAGNOSIS — Z7901 Long term (current) use of anticoagulants: Secondary | ICD-10-CM | POA: Insufficient documentation

## 2020-11-22 DIAGNOSIS — I959 Hypotension, unspecified: Secondary | ICD-10-CM | POA: Diagnosis not present

## 2020-11-22 DIAGNOSIS — Z79899 Other long term (current) drug therapy: Secondary | ICD-10-CM | POA: Insufficient documentation

## 2020-11-22 DIAGNOSIS — M069 Rheumatoid arthritis, unspecified: Secondary | ICD-10-CM | POA: Diagnosis present

## 2020-11-22 DIAGNOSIS — Z28310 Unvaccinated for covid-19: Secondary | ICD-10-CM | POA: Diagnosis not present

## 2020-11-22 DIAGNOSIS — G2581 Restless legs syndrome: Secondary | ICD-10-CM | POA: Diagnosis present

## 2020-11-22 DIAGNOSIS — E785 Hyperlipidemia, unspecified: Secondary | ICD-10-CM | POA: Diagnosis present

## 2020-11-22 DIAGNOSIS — Z20822 Contact with and (suspected) exposure to covid-19: Secondary | ICD-10-CM | POA: Diagnosis not present

## 2020-11-22 DIAGNOSIS — R739 Hyperglycemia, unspecified: Secondary | ICD-10-CM | POA: Diagnosis present

## 2020-11-22 DIAGNOSIS — R531 Weakness: Secondary | ICD-10-CM | POA: Diagnosis not present

## 2020-11-22 LAB — COMPREHENSIVE METABOLIC PANEL
ALT: 14 U/L (ref 0–44)
AST: 33 U/L (ref 15–41)
Albumin: 3.7 g/dL (ref 3.5–5.0)
Alkaline Phosphatase: 38 U/L (ref 38–126)
Anion gap: 9 (ref 5–15)
BUN: 13 mg/dL (ref 8–23)
CO2: 22 mmol/L (ref 22–32)
Calcium: 8.4 mg/dL — ABNORMAL LOW (ref 8.9–10.3)
Chloride: 103 mmol/L (ref 98–111)
Creatinine, Ser: 1.07 mg/dL — ABNORMAL HIGH (ref 0.44–1.00)
GFR, Estimated: 53 mL/min — ABNORMAL LOW (ref >60)
Glucose, Bld: 132 mg/dL — ABNORMAL HIGH (ref 70–99)
Potassium: 4.6 mmol/L (ref 3.5–5.1)
Sodium: 134 mmol/L — ABNORMAL LOW (ref 135–145)
Total Bilirubin: 0.8 mg/dL (ref 0.3–1.2)
Total Protein: 6.7 g/dL (ref 6.5–8.1)

## 2020-11-22 LAB — CBC
HCT: 26.4 % — ABNORMAL LOW (ref 36.0–46.0)
Hemoglobin: 8.2 g/dL — ABNORMAL LOW (ref 12.0–15.0)
MCH: 27 pg (ref 26.0–34.0)
MCHC: 31.1 g/dL (ref 30.0–36.0)
MCV: 86.8 fL (ref 80.0–100.0)
Platelets: 219 10*3/uL (ref 150–400)
RBC: 3.04 MIL/uL — ABNORMAL LOW (ref 3.87–5.11)
RDW: 14.6 % (ref 11.5–15.5)
WBC: 6.6 10*3/uL (ref 4.0–10.5)
nRBC: 0 % (ref 0.0–0.2)

## 2020-11-22 LAB — TYPE AND SCREEN
ABO/RH(D): B POS
Antibody Screen: NEGATIVE

## 2020-11-22 LAB — TSH: TSH: 1.357 u[IU]/mL (ref 0.350–4.500)

## 2020-11-22 LAB — PROTIME-INR
INR: 1.8 — ABNORMAL HIGH (ref 0.8–1.2)
Prothrombin Time: 20.9 seconds — ABNORMAL HIGH (ref 11.4–15.2)

## 2020-11-22 LAB — TROPONIN I (HIGH SENSITIVITY)
Troponin I (High Sensitivity): 33 ng/L — ABNORMAL HIGH (ref <18)
Troponin I (High Sensitivity): 39 ng/L — ABNORMAL HIGH (ref <18)

## 2020-11-22 LAB — APTT: aPTT: 31 seconds (ref 24–36)

## 2020-11-22 MED ORDER — DILTIAZEM HCL ER COATED BEADS 240 MG PO CP24
240.0000 mg | ORAL_CAPSULE | Freq: Every day | ORAL | Status: DC
  Administered 2020-11-23: 240 mg via ORAL
  Filled 2020-11-22: qty 1

## 2020-11-22 MED ORDER — ONDANSETRON HCL 4 MG PO TABS: 4.0000 mg | ORAL_TABLET | Freq: Four times a day (QID) | ORAL | Status: DC | PRN

## 2020-11-22 MED ORDER — SPIRONOLACTONE 25 MG PO TABS
25.0000 mg | ORAL_TABLET | Freq: Every day | ORAL | Status: DC
  Filled 2020-11-22: qty 1

## 2020-11-22 MED ORDER — ALPRAZOLAM 0.25 MG PO TABS
0.2500 mg | ORAL_TABLET | Freq: Two times a day (BID) | ORAL | Status: DC | PRN
  Administered 2020-11-22: 0.25 mg via ORAL
  Filled 2020-11-22: qty 1

## 2020-11-22 MED ORDER — ALENDRONATE SODIUM 70 MG PO TABS: 70.0000 mg | ORAL_TABLET | ORAL | Status: DC

## 2020-11-22 MED ORDER — ALBUTEROL SULFATE HFA 108 (90 BASE) MCG/ACT IN AERS
2.0000 | INHALATION_SPRAY | Freq: Four times a day (QID) | RESPIRATORY_TRACT | Status: DC | PRN
  Filled 2020-11-22: qty 6.7

## 2020-11-22 MED ORDER — GABAPENTIN 100 MG PO CAPS
100.0000 mg | ORAL_CAPSULE | Freq: Every day | ORAL | Status: DC
  Administered 2020-11-23: 100 mg via ORAL
  Filled 2020-11-22: qty 1

## 2020-11-22 MED ORDER — VITAMIN D 25 MCG (1000 UNIT) PO TABS
1000.0000 [IU] | ORAL_TABLET | Freq: Every day | ORAL | Status: DC
  Administered 2020-11-23: 08:00:00 1000 [IU] via ORAL
  Filled 2020-11-22: qty 1

## 2020-11-22 MED ORDER — FLUTICASONE PROPIONATE 50 MCG/ACT NA SUSP
2.0000 | Freq: Every day | NASAL | Status: DC
  Administered 2020-11-23: 2 via NASAL
  Filled 2020-11-22: qty 16

## 2020-11-22 MED ORDER — ONDANSETRON HCL 4 MG/2ML IJ SOLN: 4.0000 mg | Freq: Four times a day (QID) | INTRAMUSCULAR | Status: DC | PRN

## 2020-11-22 MED ORDER — PREDNISONE 10 MG PO TABS
5.0000 mg | ORAL_TABLET | Freq: Every day | ORAL | Status: DC
  Administered 2020-11-23: 08:00:00 5 mg via ORAL
  Filled 2020-11-22: qty 1

## 2020-11-22 MED ORDER — ADULT MULTIVITAMIN W/MINERALS CH
1.0000 | ORAL_TABLET | Freq: Every day | ORAL | Status: DC
  Administered 2020-11-23: 08:00:00 1 via ORAL
  Filled 2020-11-22: qty 1

## 2020-11-22 MED ORDER — MECLIZINE HCL 25 MG PO TABS
12.5000 mg | ORAL_TABLET | Freq: Three times a day (TID) | ORAL | Status: DC | PRN
  Filled 2020-11-22: qty 1

## 2020-11-22 MED ORDER — ATORVASTATIN CALCIUM 20 MG PO TABS
10.0000 mg | ORAL_TABLET | Freq: Every day | ORAL | Status: DC
  Administered 2020-11-22: 10 mg via ORAL
  Filled 2020-11-22: qty 1

## 2020-11-22 MED ORDER — SIMVASTATIN 10 MG PO TABS: 20.0000 mg | ORAL_TABLET | Freq: Every day | ORAL | Status: DC

## 2020-11-22 NOTE — H&P (Signed)
History and Physical   Kaitlyn Huff:[id number] DOB: 1941/07/07 DOA: 11/22/2020  PCP: [name], MD  Outpatient Specialists: Dr. Fath, [facility] Patient coming from: home   I have personally briefly reviewed patient's old medical records in [place].  Chief Concern: Dizziness and near syncope  HPI: Kaitlyn Huff is a 80 y.o. female with medical history significant for Rheumatoid arthritis, hypertension, anxiety, mild to moderate valvular disease, hyperlipidemia, obesity, paroxysmal atrial fibrillation, on Eliquis, hypothyroid, presents emergency department for chief concerns of dizziness  She hasn't worn her ted hose for two days due to not feeling well.  She endorses two days of dizziness.  She reports the dizziness was worse with movement.  Therefore she has been laying in bed all day for the last 2 days.  She tried meclizine and this did not improve.  She endorses nausea nad one 1 episode of vomiting 11/21/20 after chicken dinner. She denies coffee ground emesis. She endorses two episodes of loose. She endorses two episodes of bright red blood per rectum. She has hemorrhoids and has had brbpr before and it was several months ago.   She denies regular nsaid, bc, goody powder use. She states she takes about two aleves per month.   Social history: she lives with her daugther. She denies tobacco, etoh, recreational drugs. She formerly worked at [employer].   Vaccinations: she is not vaccinated.  And she does not want to be vaccinated for COVID-19  ROS: Constitutional: no weight change, no fever ENT/Mouth: no sore throat, no rhinorrhea Eyes: no eye pain, no vision changes Cardiovascular: no chest pain, no dyspnea,  no edema, no palpitations Respiratory: no cough, no sputum, no wheezing Gastrointestinal: no nausea, no vomiting, no diarrhea, no constipation Genitourinary: no urinary incontinence, no dysuria, no hematuria Musculoskeletal: no  arthralgias, no myalgias Skin: no skin lesions, no pruritus, Neuro: + weakness, no loss of consciousness, no syncope Psych: no anxiety, no depression, + decrease appetite Heme/Lymph: no bruising, no bleeding  ED Course: Discussed with ED provider, patient requiring hospitalization for dizziness and acute on chronic anemia.  Vitals in the emergency department was remarkable for temperature of 97.8, respiration rate of 22, heart rate 57, blood pressure 138/96, SPO2 of 100% on room air.  Labs in the emergency department was remarkable for initial troponins of 33, WBC 6.6, hemoglobin 8.2, platelets 216, serum creatinine 1.07, EGFR 53, nonfasting blood glucose 132.  COVID PCR is pending  Assessment/Plan  Principal Problem:   Pre-syncope Active Problems:   Hypercholesterolemia   Rheumatoid arthritis (HCC)   Hypertension   Obstructive sleep apnea   Restless leg syndrome   A-fib (HCC)   Dizziness   Anxiety   Difficulty sleeping   DJD (degenerative joint disease), lumbosacral   Hyperglycemia   Presyncope and dizziness-suspect secondary to acute on chronic anemia versus bradycardia Rectal bleeding -Orthostatic vitals ordered -ECHO complete ordered -Chest x-ray two-view ordered -Eliquis has been held -Resumed home meclizine p.o. 3 times daily as needed for dizziness - Patient is hemodynamically stable - Checking CBC in the a.m., a.m. team to consider consulting GI for possible colonoscopy  Symptomatic bradycardia-holding home metoprolol succinate 25 mg daily at this time -A.m. team to consult cardiology for beta-blockade management  Acute on chronic anemia-hemoglobin on admission was 8.2, previous hemoglobin was 11.5 -CBC in the a.m. - Goal hemoglobin greater than 8  Anxiety-resumed home alprazolam 0.25 mg twice daily  Atrial fibrillation-resumed diltiazem to 40 mg daily -Holding home metoprolol succinate  25 mg daily -Holding Eliquis 5 mg twice daily  Hypertension-  diltiazem to 40 mg daily resumed  Hyperlipidemia-atorvastatin 10 mg nightly  GERD-PPI  Chart reviewed.   02/26/2021: Complete echo ordered and was read as ejection fraction of 55 to 60%, left ventricular diastolic parameters were normal.  DVT prophylaxis: TED hose Code Status: Full code Diet: Heart healthy Family Communication: Updated daughter at bedside Disposition Plan: Pending clinical course Consults called: None at this time Admission status: MedSurg, observation, with telemetry  Past Medical History:  Diagnosis Date  . Anemia   . Cancer ([place])   . Diverticulitis   . GERD (gastroesophageal reflux disease)   . Hyperlipidemia   . Hypertension   . Osteoarthritis   . Osteoporosis    actonel  . Positive PPD    s/p INH (2006)  . Rheumatoid arthritis(714.0)    MTX transaminitis, Leflunomide (rash), enbrel, plaquinil, prednisone, remicade, Imuran (transaminitis)  . 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 ABDOMINAL HYSTERECTOMY    . CERVICAL CONE BIOPSY    . CHOLECYSTECTOMY  06/22/14  . COLONOSCOPY WITH PROPOFOL N/A 07/09/2015   Procedure: COLONOSCOPY WITH PROPOFOL;  Surgeon: Manya Silvas, MD;  Location[facility] ENDOSCOPY;  Service: Endoscopy;  Laterality: N/A;  . ESOPHAGOGASTRODUODENOSCOPY (EGD) WITH PROPOFOL N/A 07/09/2015   Procedure: ESOPHAGOGASTRODUODENOSCOPY (EGD) WITH PROPOFOL;  Surgeon: Manya Silvas, MD;  Location: [facility] ENDOSCOPY;  Service: Endoscopy;  Laterality: N/A;  . OVARY SURGERY    . TRACHEOSTOMY  1959  . TUBAL LIGATION     Social History:  reports that she has never smoked. She has never used smokeless tobacco. She reports that she does not drink alcohol and does not use drugs.  Allergies  Allergen Reactions  . Astelin [Azelastine Hcl] Other (See Comments)    Reaction:  Unknown   . Codeine Other (See Comments)    Reaction:  Altered mental status  . Dilaudid [Hydromorphone] Other (See  Comments)    Reaction:  Unknown   . Flexeril [Cyclobenzaprine] Other (See Comments)    Reaction:  Unknown   . Imuran [Azathioprine] Other (See Comments)    Reaction:  Abnormal liver function  . Lisinopril Itching  . Lyrica [Pregabalin] Other (See Comments)    Reaction:  Sore gums   . Methotrexate Derivatives Other (See Comments)    Reaction:  Abnormal liver function  . Amiodarone Anxiety and Other (See Comments)    Pt could not eat or sleep. Caused n/v and "messed her up"  . Amoxicillin Rash and Other (See Comments)    Unable to obtain enough information to answer additional questions about this medication.    Jolee Ewing [Leflunomide] Rash  . Clindamycin/Lincomycin Rash  . Doxycycline Rash  . Lodine [Etodolac] Rash  . Percocet [Oxycodone-Acetaminophen] Rash  . Sulfa Antibiotics Rash   Family History  Problem Relation Age of Onset  . Heart disease Father        MI  . Heart disease Mother   . Valvular heart disease Mother   . Breast cancer Sister 28  . Cancer Sister        Lung cancer  . COPD Sister   . Heart disease Sister   . Colon cancer Neg Hx    Family history: Family history reviewed and not pertinent  Prior to Admission medications   Medication Sig Start Date End Date Taking? Authorizing Provider  acetaminophen (TYLENOL) 325 MG tablet Take 2 tablets (  650 mg total) by mouth every 6 (six) hours as needed for mild pain (or Fever >/= 101). 06/26/17   Gouru, Illene Silver, MD  albuterol (PROAIR HFA) 108 (90 Base) MCG/ACT inhaler USE 2 INHALATIONS EVERY 6 HOURS AS NEEDED FOR WHEEZING OR SHORTNESS OF BREATH 07/21/20   [name], MD  alendronate (FOSAMAX) 70 MG tablet Take 70 mg by mouth every Monday.     [provider]  ALPRAZolam [name]) 0.25 MG tablet Take 1 tablet (0.25 mg total) by mouth 2 (two) times daily as needed for anxiety. 04/05/19   Gladstone Lighter, MD  apixaban (ELIQUIS) 5 MG TABS tablet Take 5 mg by mouth 2 (two) times daily.    [provider]   Calcium Carbonate-Vitamin D (CALCIUM 600+D) 600-400 MG-UNIT tablet Take 1 tablet by mouth 2 (two) times daily.     [provider]  cetirizine (ZYRTEC) 10 MG tablet TAKE 1 TABLET DAILY 06/10/20   [name], MD  cholecalciferol (VITAMIN D) 1000 units tablet Take 1,000 Units by mouth daily.    [provider]  colestipol (COLESTID) 1 g tablet Take 2 g by mouth daily.     [provider]  diltiazem (CARDIZEM CD) 240 MG 24 hr capsule Take 1 capsule (240 mg total) by mouth daily. 03/01/19   Epifanio Lesches, MD  fluticasone (FLONASE) 50 MCG/ACT nasal spray USE 2 SPRAYS IN EACH NOSTRIL DAILY (CALL OFFICE SOON TO RESCHEDULE YOUR PHYSICAL) 04/10/20   [name], MD  furosemide (LASIX) 40 MG tablet Take 40 mg by mouth daily.     [provider]  gabapentin (NEURONTIN) 100 MG capsule Take 100 mg by mouth daily. 01/14/20   [provider]  levofloxacin (LEVAQUIN) 500 MG tablet Take 1 tablet (500 mg total) by mouth daily. 01/27/20   [name], MD  lip balm (BLISTEX) OINT Apply 1 application topically as needed for lip care. 06/21/16   Loletha Grayer, MD  meclizine (ANTIVERT) 25 MG tablet take 1/2 to 1 tablet every 8 hours if needed for VERTIGO Patient taking differently: Take 12.5-25 mg by mouth 3 (three) times daily as needed for dizziness.  11/17/17   [name], MD  metoprolol succinate (TOPROL-XL) 25 MG 24 hr tablet Take 1 tablet (25 mg total) by mouth daily. 04/06/19   Gladstone Lighter, MD  Multiple Vitamin (MULTIVITAMIN WITH MINERALS) TABS tablet Take 1 tablet by mouth daily.    [provider]  potassium chloride (MICRO-K) 10 MEQ CR capsule TAKE 2 CAPSULES DAILY 02/25/20   [name], MD  predniSONE (DELTASONE) 5 MG tablet Take 1 tablet (5 mg total) by mouth daily with breakfast. 12/23/16   Bettey Costa, MD  Probiotic Product (ALIGN PO) Take 1 tablet by mouth daily.    [provider]  simvastatin (ZOCOR) 20 MG  tablet TAKE 1 TABLET DAILY 09/28/20   [name], MD  spironolactone (ALDACTONE) 25 MG tablet Take by mouth. 08/05/20   [provider]   Physical Exam: Vitals:   11/22/20 1700 11/22/20 1730 11/22/20 1933 11/22/20 2103  BP: 126/64 (!) 138/96 110/77 (!) 147/68  Pulse: (!) 41 (!) 57 (!) 57 (!) 54  Resp: 20 (!) _0 Temp:   98.2 F (36.8 C) 97.8 F (36.6 C)  TempSrc:   Oral Oral  SpO2: 97% 100% 97% 98%  Weight:    62.6 kg  Height:    _1  (1.651 m)   Constitutional: appears age-appropriate, NAD, calm, comfortable Eyes: PERRL, lids and conjunctivae  normal ENMT: Mucous membranes are moist. Posterior pharynx clear of any exudate or lesions. Age-appropriate dentition. Hearing appropriate Neck: normal, supple, no masses, no thyromegaly Respiratory: clear to auscultation bilaterally, no wheezing, no crackles. Normal respiratory effort. No accessory muscle use.  Cardiovascular: Regular rate and rhythm, no murmurs / rubs / gallops.  2+ bilateral lower extremity pitting edema. 2+ pedal pulses. No carotid bruits.  Abdomen: Obese abdomen, no tenderness, no masses palpated, no hepatosplenomegaly. Bowel sounds positive.  Musculoskeletal: no clubbing / cyanosis. No joint deformity upper and lower extremities. Good ROM, no contractures, no atrophy. Normal muscle tone.  Skin: no rashes, lesions, ulcers. No induration Neurologic: Sensation intact. Strength 5/5 in all 4.  Psychiatric: Normal judgment and insight. Alert and oriented x 3. Normal mood.   EKG: independently reviewed, showing sinus bradycardia with rate of 44, QTc 403  Chest x-ray on Admission: I personally reviewed and I agree with radiologist reading as below.  No results found.  Labs on Admission: I have personally reviewed following labs  CBC: Recent Labs  Lab 11/22/20 1632  WBC 6.6  HGB 8.2*  HCT 26.4*  MCV 86.8  PLT 215   Basic Metabolic Panel: Recent Labs  Lab 11/22/20 1632  NA 134*  K 4.6  CL 103   CO2 22  GLUCOSE 132*  BUN 13  CREATININE 1.07*  CALCIUM 8.4*   GFR: Estimated Creatinine Clearance: 39.6 mL/min (A) (by C-G formula based on SCr of 1.07 mg/dL (H)).  Liver Function Tests: Recent Labs  Lab 11/22/20 1632  AST 33  ALT 14  ALKPHOS 38  BILITOT 0.8  PROT 6.7  ALBUMIN 3.7   Coagulation Profile: Recent Labs  Lab 11/22/20 1632  INR 1.8*   Urine analysis:    Component Value Date/Time   COLORURINE YELLOW (A) 02/27/2019 0355   APPEARANCEUR CLEAR (A) 02/27/2019 0355   APPEARANCEUR Clear 11/02/2014 0225   LABSPEC 1.009 02/27/2019 0355   LABSPEC 1.010 11/02/2014 0225   PHURINE 8.0 02/27/2019 0355   GLUCOSEU NEGATIVE 02/27/2019 0355   GLUCOSEU Negative 11/02/2014 0225   HGBUR NEGATIVE 02/27/2019 0355   BILIRUBINUR NEGATIVE 02/27/2019 0355   BILIRUBINUR Negative 11/02/2014 0225   KETONESUR NEGATIVE 02/27/2019 0355   PROTEINUR NEGATIVE 02/27/2019 0355   NITRITE NEGATIVE 02/27/2019 0355   LEUKOCYTESUR NEGATIVE 02/27/2019 0355   LEUKOCYTESUR Negative 11/02/2014 0225   Zackarey Holleman N Jefferey Lippmann D.O. [facility]  If 7PM-7AM, please contact overnight-coverage provider If 7AM-7PM, please contact day coverage provider [email or web address]  11/23/2020, 12:48 AM

## 2020-11-22 NOTE — ED Provider Notes (Signed)
[facility] Emergency Department Provider Note   ____________________________________________    I have reviewed the triage vital signs and the nursing notes.   HISTORY  Chief Complaint Bradycardia     HPI Kaitlyn Huff is a 80 y.o. female who presents with complaints of lightheadedness.  Patient reports over the last several days she has felt lightheaded however it seemed to worsen today.  She felt she was going to faint when she stood up.  She feels better when she is lying down.  She denies chest pain.  No nausea vomiting or diaphoresis.  No fevers chills or cough.  She does report brown stools although no noted a small amount of blood which she attributed to hemorrhoids today.  Review of medical record demonstrates the patient does have a history of paroxysmal atrial fibrillation, is on Cardizem and metoprolol, also is on Eliquis  Past Medical History:  Diagnosis Date  . Anemia   . Cancer ([place])   . Diverticulitis   . GERD (gastroesophageal reflux disease)   . Hyperlipidemia   . Hypertension   . Osteoarthritis   . Osteoporosis    actonel  . Positive PPD    s/p INH (2006)  . Rheumatoid arthritis(714.0)    MTX transaminitis, Leflunomide (rash), enbrel, plaquinil, prednisone, remicade, Imuran (transaminitis)  . Valvular heart disease    moderate MR and TR    Patient Active Problem List   Diagnosis Date Noted  . Sinusitis 02/02/2020  . Hyperglycemia 02/02/2020  . Atrial fibrillation with RVR ([place]) 02/27/2019  . DJD (degenerative joint disease), lumbosacral 04/02/2018  . Nodule of groin 04/01/2018  . Fatty liver 03/29/2018  . Anxiety 01/19/2018  . Difficulty sleeping 01/19/2018  . Acute urticaria 12/21/2017  . Colitis 06/24/2017  . Dizziness 10/01/2016  . A-fib ([place]) 07/12/2016  . 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 Chronic venous insufficiency 04/25/2016  . Pain in limb 04/25/2016  . Swelling of  limb 04/25/2016  . Tachycardia 03/22/2016  . Foot fracture 03/18/2016  . Cough 01/27/2016  . Hypoxia 12/20/2015  . Lower extremity edema 12/18/2015  . Diarrhea 09/15/2015  . Restless leg syndrome 08/07/2015  . History of colonic polyps 07/10/2015  . Health care maintenance 01/11/2015  . Weight loss 08/04/2013  . Obstructive sleep apnea 12/23/2012  . Dilated bile duct 12/23/2012  . Hypercholesterolemia 08/12/2012  . Rheumatoid arthritis ([place]) 08/12/2012  . GERD (gastroesophageal reflux disease) 08/12/2012  . Hypertension 08/12/2012  . Anemia 08/12/2012    Past Surgical History:  Procedure Laterality Date  . ABDOMINAL HYSTERECTOMY    . CERVICAL CONE BIOPSY    . CHOLECYSTECTOMY  06/22/14  . COLONOSCOPY WITH PROPOFOL N/A 07/09/2015   Procedure: COLONOSCOPY WITH PROPOFOL;  Surgeon: Manya Silvas, MD;  Location: [facility] ENDOSCOPY;  Service: Endoscopy;  Laterality: N/A;  . ESOPHAGOGASTRODUODENOSCOPY (EGD) WITH PROPOFOL N/A 07/09/2015   Procedure: ESOPHAGOGASTRODUODENOSCOPY (EGD) WITH PROPOFOL;  Surgeon: Manya Silvas, MD;  Location: [facility] ENDOSCOPY;  Service: Endoscopy;  Laterality: N/A;  . OVARY SURGERY    . TRACHEOSTOMY  1959  . TUBAL LIGATION      Prior to Admission medications   Medication Sig Start Date End Date Taking? Authorizing Provider  ALPRAZolam (XANAX) 0.25 MG tablet Take 1 tablet (0.25 mg total) by mouth 2 (two) times daily as needed for anxiety. 04/05/19  Yes Gladstone Lighter, MD  apixaban (ELIQUIS) 5 MG TABS tablet Take 5 mg by mouth 2 (two) times daily.  Yes [provider]  Calcium Carbonate-Vitamin D 600-400 MG-UNIT tablet Take 1 tablet by mouth 2 (two) times daily.    Yes [provider]  cetirizine (ZYRTEC) 10 MG tablet TAKE 1 TABLET DAILY 06/10/20  Yes [name], MD  cholecalciferol (VITAMIN D) 1000 units tablet Take 1,000 Units by mouth daily.   Yes [provider]  clindamycin (CLEOCIN) 150 MG capsule Take 150 mg by mouth 4  (four) times daily. 02/19/20  Yes [provider]  colestipol (COLESTID) 1 g tablet Take 2 g by mouth daily.    Yes [provider]  diltiazem (CARDIZEM CD) 240 MG 24 hr capsule Take 1 capsule (240 mg total) by mouth daily. 03/01/19  Yes Epifanio Lesches, MD  fluticasone (FLONASE) 50 MCG/ACT nasal spray USE 2 SPRAYS IN EACH NOSTRIL DAILY (CALL OFFICE SOON TO RESCHEDULE YOUR PHYSICAL) 04/10/20  Yes [name], MD  furosemide (LASIX) 40 MG tablet Take 40 mg by mouth daily.    Yes [provider]  gabapentin (NEURONTIN) 100 MG capsule Take 100 mg by mouth daily. 01/14/20  Yes [provider]  metoprolol succinate (TOPROL-XL) 25 MG 24 hr tablet Take 1 tablet (25 mg total) by mouth daily. 04/06/19  Yes Gladstone Lighter, MD  Multiple Vitamin (MULTIVITAMIN WITH MINERALS) TABS tablet Take 1 tablet by mouth daily.   Yes [provider]  naproxen sodium (ALEVE) 220 MG tablet Take 220 mg by mouth 2 (two) times daily as needed.   Yes [provider]  omeprazole (PRILOSEC) 20 MG capsule Take 1 capsule by mouth daily. 10/20/20  Yes [provider]  potassium chloride (MICRO-K) 10 MEQ CR capsule TAKE 2 CAPSULES DAILY 02/25/20  Yes [name], MD  Probiotic Product (ALIGN PO) Take 1 tablet by mouth daily.   Yes [provider]  simvastatin (ZOCOR) 20 MG tablet TAKE 1 TABLET DAILY 09/28/20  Yes [name], MD  spironolactone (ALDACTONE) 25 MG tablet Take 12.5 mg by mouth daily. 08/05/20  Yes [provider]  traMADol (ULTRAM) 50 MG tablet Take 50 mg by mouth 2 (two) times daily as needed. 11/13/20  Yes [provider]  acetaminophen (TYLENOL) 325 MG tablet Take 2 tablets (650 mg total) by mouth every 6 (six) hours as needed for mild pain (or Fever >/= 101). Patient not taking: No sig reported 06/26/17   Gouru, Illene Silver, MD  albuterol (PROAIR HFA) 108 (90 Base) MCG/ACT inhaler USE 2 INHALATIONS EVERY 6 HOURS AS NEEDED FOR  WHEEZING OR SHORTNESS OF BREATH 07/21/20   [name], MD  alendronate (FOSAMAX) 70 MG tablet Take 70 mg by mouth every Monday.     [provider]  erythromycin ethylsuccinate (EES) 400 MG tablet Take 800 mg by mouth 2 (two) times daily. 11/20/20   [provider]  levofloxacin (LEVAQUIN) 500 MG tablet Take 1 tablet (500 mg total) by mouth daily. Patient not taking: No sig reported 01/27/20   [name], MD  lip balm (BLISTEX) OINT Apply 1 application topically as needed for lip care. 06/21/16   Loletha Grayer, MD  meclizine (ANTIVERT) 25 MG tablet take 1/2 to 1 tablet every 8 hours if needed for VERTIGO Patient not taking: No sig reported 11/17/17   [name], MD  predniSONE (DELTASONE) 5 MG tablet Take 1 tablet (5 mg total) by mouth daily with breakfast. 12/23/16   Bettey Costa, MD     Allergies Astelin [azelastine hcl], Codeine, Dilaudid [hydromorphone], Flexeril [cyclobenzaprine], Imuran [azathioprine], Lisinopril, Lyrica [pregabalin], Methotrexate derivatives,  Amiodarone, Amoxicillin, Arava [leflunomide], Clindamycin/lincomycin, Doxycycline, Lodine [etodolac], Percocet [oxycodone-acetaminophen], and Sulfa antibiotics  Family History  Problem Relation Age of Onset  . Heart disease Father        MI  . Heart disease Mother   . Valvular heart disease Mother   . Breast cancer Sister 61  . Cancer Sister        Lung cancer  . COPD Sister   . Heart disease Sister   . Colon cancer Neg Hx     Social History Social History   Tobacco Use  . Smoking status: Never Smoker  . Smokeless tobacco: Never Used  Substance Use Topics  . Alcohol use: No    Alcohol/week: 0.0 standard drinks  . Drug use: No    Review of Systems  Constitutional: No fever/chills Eyes: No visual changes.  ENT: No sore throat. Cardiovascular: Denies chest pain.  No palpitations Respiratory: Denies shortness of breath. Gastrointestinal: No abdominal pain.   Genitourinary: Negative  for dysuria. Musculoskeletal: Negative for back pain. Skin: Negative for rash. Neurological: Negative for headaches or weakness   ____________________________________________   PHYSICAL EXAM:  VITAL SIGNS: ED Triage Vitals  Enc Vitals Group     BP 11/22/20 1634 122/64     Pulse Rate 11/22/20 1634 (!) 40     Resp 11/22/20 1634 20     Temp 11/22/20 1634 97.8 F (36.6 C)     Temp Source 11/22/20 1634 Oral     SpO2 11/22/20 1634 95 %     Weight 11/22/20 1637 65.8 kg (145 lb)     Height 11/22/20 1637 1.651 m (5\' 5" )     Head Circumference --      Peak Flow --      Pain Score 11/22/20 1636 0     Pain Loc --      Pain Edu? --      Excl. in [place]? --     Constitutional: Alert and oriented. No acute distress.  Nose: No congestion/rhinnorhea. Mouth/Throat: Mucous membranes are moist.   Neck:  Painless ROM Cardiovascular: Bradycardia, regular rhythm. Grossly normal heart sounds.  Good peripheral circulation. Respiratory: Normal respiratory effort.  No retractions. Lungs CTAB. Gastrointestinal: Soft and nontender. No distention.  No CVA tenderness.  Musculoskeletal: No lower extremity tenderness nor edema.  Warm and well perfused Neurologic:  Normal speech and language. No gross focal neurologic deficits are appreciated.  Skin:  Skin is warm, dry and intact. No rash noted. Psychiatric: Mood and affect are normal. Speech and behavior are normal.  ____________________________________________   LABS (all labs ordered are listed, but only abnormal results are displayed)  Labs Reviewed  CBC - Abnormal; Notable for the following components:      Result Value   RBC 3.04 (*)    Hemoglobin 8.2 (*)    HCT 26.4 (*)    All other components within normal limits  COMPREHENSIVE METABOLIC PANEL - Abnormal; Notable for the following components:   Sodium 134 (*)    Glucose, Bld 132 (*)    Creatinine, Ser 1.07 (*)    Calcium 8.4 (*)    GFR, Estimated 53 (*)    All other components within  normal limits  PROTIME-INR - Abnormal; Notable for the following components:   Prothrombin Time 20.9 (*)    INR 1.8 (*)    All other components within normal limits  TROPONIN I (HIGH SENSITIVITY) - Abnormal; Notable for the following components:   Troponin I (High Sensitivity) 33 (*)  All other components within normal limits  SARS CORONAVIRUS 2 (TAT 6-24 HRS)  APTT  BASIC METABOLIC PANEL  CBC  TSH  METHYLMALONIC ACID, SERUM  TYPE AND SCREEN  TROPONIN I (HIGH SENSITIVITY)   ____________________________________________  EKG  ED ECG REPORT I, Lavonia Drafts, the attending physician, personally viewed and interpreted this ECG.  Date: 11/22/2020  Rhythm: Sinus bradycardia QRS Axis: normal Intervals: normal ST/T Wave abnormalities: normal Narrative Interpretation: no evidence of acute ischemia  ____________________________________________  RADIOLOGY  None ____________________________________________   PROCEDURES  Procedure(s) performed: No  Procedures   Critical Care performed: No ____________________________________________   INITIAL IMPRESSION / ASSESSMENT AND PLAN / ED COURSE  Pertinent labs & imaging results that were available during my care of the patient were reviewed by me and considered in my medical decision making (see chart for details).  Patient presents with lightheadedness as noted above.  Found to be bradycardic here in the emergency department, this is certainly be the etiology of her dizziness.  She is on Cardizem and a beta-blocker.  Also reports small amount of bright red blood per rectum.  Hemoglobin is 8.2 down from 11.51-year ago, she is on Eliquis, brown stool possibility of GI bleed however.  Admit to the hospitalist service for further evaluation     ____________________________________________   FINAL CLINICAL IMPRESSION(S) / ED DIAGNOSES  Final diagnoses:  Bradycardia  Gastrointestinal hemorrhage, unspecified gastrointestinal  hemorrhage type        Note:  This document was prepared using Dragon voice recognition software and may include unintentional dictation errors.   Lavonia Drafts, MD 11/22/20 1859

## 2020-11-22 NOTE — Progress Notes (Signed)
PHARMACIST - PHYSICIAN ORDER COMMUNICATION  CONCERNING: diltiazem and Simvastatin  and risk of rhabdomyolysis  DESCRIPTION:  Patients on diltiazem and simvastatin >10 mg/day have reported cases of rhabdomyolysis. Pharmacy is to assess simvastatin dose. If >10 mg, substitute atorvastatin (Lipitor) 1mg  for each 2mg  simvastatin.  This patient is ordered simvastatin 20 mg and diltiazem  240 mg.    ACTION TAKEN: Per protocol pharmacy has discontinued the patient's order for simvastatin and replaced it with Atorvastatin 10 mg.    Dorothe Pea, PharmD, BCPS Clinical Pharmacist 11/22/2020 8:33 PM

## 2020-11-22 NOTE — ED Notes (Signed)
Ginger ale provided, daughter at bedside, no other needs at this time

## 2020-11-22 NOTE — ED Notes (Signed)
Report received from [facility]. Pt resting on stretcher with family member at bedside. Pt denies needs

## 2020-11-22 NOTE — ED Triage Notes (Signed)
Pt to ED [facility] from home for near syncope and dizziness x5 days. Ems reports pt a fib and brady HR 30's-40s 18G to right ac, LR given PTA Pt alert and oriented Hx afib

## 2020-11-23 ENCOUNTER — Encounter: Payer: Self-pay | Admitting: Internal Medicine

## 2020-11-23 ENCOUNTER — Observation Stay
Admit: 2020-11-23 | Discharge: 2020-11-23 | Disposition: A | Discharge status: Home / Self Care | Payer: Medicare Other | Attending: Internal Medicine | Admitting: Internal Medicine

## 2020-11-23 ENCOUNTER — Observation Stay: Payer: Medicare Other

## 2020-11-23 ENCOUNTER — Other Ambulatory Visit: Payer: Self-pay

## 2020-11-23 DIAGNOSIS — R42 Dizziness and giddiness: Secondary | ICD-10-CM | POA: Diagnosis not present

## 2020-11-23 DIAGNOSIS — R079 Chest pain, unspecified: Secondary | ICD-10-CM | POA: Diagnosis not present

## 2020-11-23 DIAGNOSIS — R55 Syncope and collapse: Secondary | ICD-10-CM

## 2020-11-23 DIAGNOSIS — J811 Chronic pulmonary edema: Secondary | ICD-10-CM | POA: Diagnosis not present

## 2020-11-23 LAB — ECHOCARDIOGRAM COMPLETE
AR max vel: 2.27 cm2
AV Area VTI: 2.46 cm2
AV Area mean vel: 2.31 cm2
AV Mean grad: 5.5 mmHg
AV Peak grad: 11.2 mmHg
Ao pk vel: 1.68 m/s
Area-P 1/2: 3.7 cm2
Height: 65 in
S' Lateral: 2.63 cm
Weight: 2289.26 oz

## 2020-11-23 LAB — IRON AND TIBC
Iron: 28 ug/dL (ref 28–170)
Saturation Ratios: 7 % — ABNORMAL LOW (ref 10.4–31.8)
TIBC: 433 ug/dL (ref 250–450)
UIBC: 405 ug/dL

## 2020-11-23 LAB — CBC
HCT: 25.5 % — ABNORMAL LOW (ref 36.0–46.0)
Hemoglobin: 8.3 g/dL — ABNORMAL LOW (ref 12.0–15.0)
MCH: 27.9 pg (ref 26.0–34.0)
MCHC: 32.5 g/dL (ref 30.0–36.0)
MCV: 85.6 fL (ref 80.0–100.0)
Platelets: 216 10*3/uL (ref 150–400)
RBC: 2.98 MIL/uL — ABNORMAL LOW (ref 3.87–5.11)
RDW: 14.6 % (ref 11.5–15.5)
WBC: 8.3 10*3/uL (ref 4.0–10.5)
nRBC: 0 % (ref 0.0–0.2)

## 2020-11-23 LAB — VITAMIN B12: Vitamin B-12: 369 pg/mL (ref 180–914)

## 2020-11-23 LAB — BASIC METABOLIC PANEL
Anion gap: 8 (ref 5–15)
BUN: 11 mg/dL (ref 8–23)
CO2: 24 mmol/L (ref 22–32)
Calcium: 8.6 mg/dL — ABNORMAL LOW (ref 8.9–10.3)
Chloride: 107 mmol/L (ref 98–111)
Creatinine, Ser: 0.98 mg/dL (ref 0.44–1.00)
GFR, Estimated: 59 mL/min — ABNORMAL LOW (ref >60)
Glucose, Bld: 81 mg/dL (ref 70–99)
Potassium: 3.9 mmol/L (ref 3.5–5.1)
Sodium: 139 mmol/L (ref 135–145)

## 2020-11-23 LAB — FOLATE: Folate: 48 ng/mL (ref >5.9)

## 2020-11-23 LAB — GLUCOSE, CAPILLARY
Glucose-Capillary: 83 mg/dL (ref 70–99)
Glucose-Capillary: 86 mg/dL (ref 70–99)
Glucose-Capillary: 109 mg/dL — ABNORMAL HIGH (ref 70–99)
Glucose-Capillary: 157 mg/dL — ABNORMAL HIGH (ref 70–99)

## 2020-11-23 LAB — SARS CORONAVIRUS 2 (TAT 6-24 HRS): SARS Coronavirus 2: NEGATIVE

## 2020-11-23 MED ORDER — TRAMADOL HCL 50 MG PO TABS
50.0000 mg | ORAL_TABLET | Freq: Two times a day (BID) | ORAL | Status: DC | PRN
Start: 2020-11-23 — End: 2020-11-23
  Administered 2020-11-23: 50 mg via ORAL
  Filled 2020-11-23: qty 1

## 2020-11-23 MED ORDER — ATORVASTATIN CALCIUM 20 MG PO TABS: 10.0000 mg | ORAL_TABLET | Freq: Every day | ORAL | 2 refills | Status: DC

## 2020-11-23 MED ORDER — METOPROLOL SUCCINATE ER 25 MG PO TB24
ORAL_TABLET | ORAL | 2 refills | Status: DC
Start: 2020-11-23 — End: 2021-07-06

## 2020-11-23 MED ORDER — POTASSIUM CHLORIDE ER 10 MEQ PO CPCR
ORAL_CAPSULE | ORAL | 3 refills | Status: DC
Start: 2020-11-23 — End: 2022-08-14

## 2020-11-23 MED ORDER — PANTOPRAZOLE SODIUM 40 MG PO TBEC
40.0000 mg | DELAYED_RELEASE_TABLET | Freq: Every morning | ORAL | Status: DC
  Filled 2020-11-23: qty 1

## 2020-11-23 MED ORDER — APIXABAN 5 MG PO TABS: ORAL_TABLET | ORAL | Status: DC

## 2020-11-23 MED ORDER — FUROSEMIDE 40 MG PO TABS: ORAL_TABLET | ORAL | Status: DC

## 2020-11-23 MED ORDER — SODIUM CHLORIDE 0.9% FLUSH
3.0000 mL | Freq: Two times a day (BID) | INTRAVENOUS | Status: DC
  Administered 2020-11-23 (×2): 3 mL via INTRAVENOUS

## 2020-11-23 MED ORDER — IRON 90 (18 FE) MG PO TABS: 1.0000 | ORAL_TABLET | Freq: Every day | ORAL | 0 refills | Status: DC

## 2020-11-23 NOTE — TOC Initial Note (Addendum)
Transition of Care [facility]) - Initial/Assessment Note    Patient Details  Name: Kaitlyn Huff MRN: [id number] Date of Birth: 1941-05-29  Transition of [facility]) CM/SW Contact:    Shelbie Hutching, RN Phone Number: 11/23/2020, 1:59 PM  Clinical Narrative:                 Patient placed under observation for pre syncope episode and dizziness for past several days.  RNCM met with patient at the bedside, daughter is at the bedside.  Patient lives with daughter and is independent.  Patient can drive but does not drive often.  Patient is current with her PCP.  She has had home health in the past and agrees to it again if recommended.  Has used [facility] in the past and would like to use them again.  Corene Cornea with [facility] given referral for RN and PT.  Patient has a walker and cane at home but does not use them.    Expected Discharge Plan: [place] Barriers to Discharge: Continued Medical Work up   Patient Goals and CMS Choice Patient states their goals for this hospitalization and ongoing recovery are:: Will agree to home health if recommended CMS Medicare.gov Compare Post Acute Care list provided to:: Patient Choice offered to / list presented to : [place]  Expected Discharge Plan and Services Expected Discharge Plan: [place]   Discharge Planning Services: CM Consult Post Acute Care Choice: [place] arrangements for the past 2 months: Single Family Home                 DME Arranged: N/A DME Agency: NA       HH Arranged: RN,PT [place] Agency: [place] ([place]) Date [place]: 11/23/20 Time [place]: 1356 Representative spoke with at [place]: Corene Cornea  Prior Living Arrangements/Services Living arrangements for the past 2 months: [place] Lives with:: Adult Children Patient language and need for interpreter reviewed:: Yes Do you feel safe going back to the place where you live?: Yes       Need for Family Participation in Patient Care: Yes (Comment) (syncope) Care giver support system in place?: Yes (comment) (daughters) Current home services: DME (cane and walker) Criminal Activity/Legal Involvement Pertinent to Current Situation/Hospitalization: No - Comment as needed  Activities of Daily Living Home Assistive Devices/Equipment: Eyeglasses ADL Screening (condition at time of admission) Patient's cognitive ability adequate to safely complete daily activities?: Yes Is the patient deaf or have difficulty hearing?: No Does the patient have difficulty seeing, even when wearing glasses/contacts?: No Does the patient have difficulty concentrating, remembering, or making decisions?: No Patient able to express need for assistance with ADLs?: Yes Does the patient have difficulty dressing or bathing?: No Independently performs ADLs?: Yes (appropriate for developmental age) Walks in Home: Independent Does the patient have difficulty walking or climbing stairs?: Yes Weakness of Legs: None Weakness of Arms/Hands: None  Permission Sought/Granted Permission sought to share information with : Case Manager,Family Supports,Other (comment) Permission granted to share information with : Yes, Verbal Permission Granted  Share Information with NAME: Horris Latino  Permission granted to share info w AGENCY: [facility]  Permission granted to share info w Relationship: daughter     Emotional Assessment Appearance:: Appears stated age Attitude/Demeanor/Rapport: Engaged Affect (typically observed): Accepting Orientation: : Oriented to Self,Oriented to Place,Oriented to  Time,Oriented to Situation Alcohol / Substance Use: Not Applicable Psych Involvement: No (comment)  Admission diagnosis:  Dizziness [R42] Bradycardia [R00.1] Gastrointestinal hemorrhage, unspecified gastrointestinal hemorrhage type [K92.2] Patient Active Problem List   Diagnosis Date Noted  . Pre-syncope 11/23/2020  .  Sinusitis 02/02/2020  . Hyperglycemia 02/02/2020  . Atrial fibrillation with RVR ([place]) 02/27/2019  . DJD (degenerative joint disease), lumbosacral 04/02/2018  . Nodule of groin 04/01/2018  . Fatty liver 03/29/2018  . Anxiety 01/19/2018  . Difficulty sleeping 01/19/2018  . Acute urticaria 12/21/2017  . Colitis 06/24/2017  . Dizziness 10/01/2016  . A-fib ([place]) 07/12/2016  . 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 Chronic venous insufficiency 04/25/2016  . Pain in limb 04/25/2016  . Swelling of limb 04/25/2016  . Tachycardia 03/22/2016  . Foot fracture 03/18/2016  . Cough 01/27/2016  . Hypoxia 12/20/2015  . Lower extremity edema 12/18/2015  . Diarrhea 09/15/2015  . Restless leg syndrome 08/07/2015  . History of colonic polyps 07/10/2015  . Health care maintenance 01/11/2015  . Weight loss 08/04/2013  . Obstructive sleep apnea 12/23/2012  . Dilated bile duct 12/23/2012  . Hypercholesterolemia 08/12/2012  . Rheumatoid arthritis ([place]) 08/12/2012  . GERD (gastroesophageal reflux disease) 08/12/2012  . Hypertension 08/12/2012  . Anemia 08/12/2012   PCP:  [name], MD Pharmacy:   [facility] [address] [place] - [place] [address] [facility] [place] [address] Phone: [phone] Fax: [phone]  [facility] [place], [place] [address] [place] [address] Phone: [phone] Fax: [phone]     Social Determinants of Health (SDOH) Interventions    Readmission Risk Interventions No flowsheet data found.

## 2020-11-23 NOTE — Progress Notes (Signed)
*  PRELIMINARY RESULTS* Echocardiogram 2D Echocardiogram has been performed.  Kaitlyn Huff 11/23/2020, 2:12 PM

## 2020-11-23 NOTE — Evaluation (Signed)
Physical Therapy Evaluation Patient Details Name: Kaitlyn Huff MRN: [id number] DOB: June 10, 1941 Today's Date: 11/23/2020   History of Present Illness  Meyli Boice is a 65yoF who comes to [facility] on 5/15 c dizziness and near syncope. Pt reports 2 days of dizziness, worse with movement. Pt stayed in bed for 2 days, did not achieve any improvement with meclizine. PMH: RA, HTN, GAD, HLD, PAF on eliquis, hypoTSH.  Clinical Impression  Pt admitted with above diagnosis. Pt currently with functional limitations due to the deficits listed below (see "PT Problem List"). MD calling PT for evaluation, including screening specific for vestibular involvement in presentation. Upon entry, pt in BR with DTR, awake and agreeable to participate. Pt denies any dizziness today, some earlier in AM prior to taking a nap. Pt reports feeling dizzy for 2 days prior to admission, particularly worse when upright and/or trying to be active around the house, essentially remained in bed where her symptoms were much improved. Pt denies any aggravation of dizziness with turning in bed or getting into bed. PT describes her dizziness symptoms as 'feeling like you are about to pass out.' Pt has had 1-2 episodes of this in the past. Pt reported no improvement with meclizine at home, reports she has had 'inner ear problems' in the past, but this is different. This episode devoid of frank vertiginous symptoms. Pt has no symptoms in session today. Pt has normal occulomotor exam, no nystagmus, negative horizontal canal BPPV test bilaterally. Pt able to AMB >226ft slowly without symptoms, with only mild unsteadiness, terminal HR: 60 bpm. Pt moved to supine to assess for orthostasis (which was negative) but shown bradycardia at 47 bpm. Educated pt/DTR on concerns of bradycardia contributing to symptoms and negative BPPV testing. Discussed with MD, who reports bradycardia suspected to be transient and anticipates some improvement with modification to  medication. Of note pt does has some mild ABLA trending from 11s to 8s over a month. Patient's performance this date reveals decreased ability, independence, and tolerance in performing all basic mobility required for performance of IADL, but anticipate safe ability to perform ADL. Pt requires some assistance and education for safety. Pt will benefit from skilled PT intervention to increase independence and safety with basic mobility in preparation for discharge to the venue listed below.       Follow Up Recommendations Home health PT    Equipment Recommendations  None recommended by PT    Recommendations for Other Services       Precautions / Restrictions Precautions Precautions: Fall Restrictions Weight Bearing Restrictions: No      Mobility  Bed Mobility Overal bed mobility: Independent                  Transfers Overall transfer level: Independent                  Ambulation/Gait Ambulation/Gait assistance: Min guard Gait Distance (Feet): 270 Feet Assistive device: None   Gait velocity: 0.25m/s   General Gait Details: slower than baseline, guarded, a few instances of lateral sway, no frank LOB, no physical assist or device needed. Terminal vitals post AMB (taken in standing): 125/73mmHg, 60bpm.  Stairs            Wheelchair Mobility    Modified Rankin (Stroke Patients Only)       Balance Overall balance assessment: Mild deficits observed, not formally tested;Modified Independent  Pertinent Vitals/Pain Pain Assessment: No/denies pain    Home Living Family/patient expects to be discharged to:: Private residence Living Arrangements: Children Available Help at Discharge: Family;Available 24 hours/day Type of Home: House Home Access: Stairs to enter Entrance Stairs-Rails: Can reach both Entrance Stairs-Number of Steps: 5   Home Equipment: Walker - 2 wheels      Prior Function  Level of Independence: Independent         Comments: was driving up until 2 WA, no device needed for AMB.     Hand Dominance   Dominant Hand: Right    Extremity/Trunk Assessment   Upper Extremity Assessment Upper Extremity Assessment: Overall WFL for tasks assessed    Lower Extremity Assessment Lower Extremity Assessment: Overall WFL for tasks assessed       Communication      Cognition Arousal/Alertness: Awake/alert Behavior During Therapy: WFL for tasks assessed/performed Overall Cognitive Status: Within Functional Limits for tasks assessed                                        General Comments      Exercises     Assessment/Plan    PT Assessment Patient needs continued PT services  PT Problem List Decreased activity tolerance;Decreased balance;Decreased mobility       PT Treatment Interventions Gait training;Stair training;Therapeutic activities;Therapeutic exercise;Patient/family education    PT Goals (Current goals can be found in the Care Plan section)  Acute Rehab PT Goals Patient Stated Goal: regain baseline level of function PT Goal Formulation: With patient Time For Goal Achievement: 12/07/20 Potential to Achieve Goals: Good    Frequency Min 2X/week   Barriers to discharge        Co-evaluation               AM-PAC PT "6 Clicks" Mobility  Outcome Measure Help needed turning from your back to your side while in a flat bed without using bedrails?: None Help needed moving from lying on your back to sitting on the side of a flat bed without using bedrails?: None Help needed moving to and from a bed to a chair (including a wheelchair)?: None Help needed standing up from a chair using your arms (e.g., wheelchair or bedside chair)?: None Help needed to walk in hospital room?: A Little Help needed climbing 3-5 steps with a railing? : A Little 6 Click Score: 22    End of Session Equipment Utilized During Treatment: Gait  belt Activity Tolerance: Patient tolerated treatment well;No increased pain Patient left: in bed;with family/visitor present;with call bell/phone within reach   PT Visit Diagnosis: Unsteadiness on feet (R26.81);Difficulty in walking, not elsewhere classified (R26.2);Other abnormalities of gait and mobility (R26.89)    Time: 3299-2426 PT Time Calculation (min) (ACUTE ONLY): 26 min   Charges:   PT Evaluation $PT Eval High Complexity: 1 High PT Treatments $Therapeutic Exercise: 8-22 mins        4:09 PM, 11/23/20 Etta Grandchild, PT, DPT Physical Therapist - [facility]  [phone] ([place])    [place] 11/23/2020, 3:53 PM

## 2020-11-23 NOTE — Care Management Obs Status (Signed)
[place] NOTIFICATION   Patient Details  Name: ISAAC LACSON MRN: [id number] Date of Birth: [birth year]   Medicare Observation Status Notification Given:  Yes    Shelbie Hutching, RN 11/23/2020, 12:07 PM

## 2020-11-23 NOTE — TOC Transition Note (Signed)
Transition of Care [facility]) - CM/SW Discharge Note   Patient Details  Name: Kaitlyn Huff MRN: [id number] Date of Birth: 07-23-1940  Transition of Care [facility]) CM/SW Contact:  Shelbie Hutching, RN Phone Number: 11/23/2020, 4:16 PM   Clinical Narrative:    Patient cleared for discharge home with home health services.  [facility] accepted [facility].  Patient's daughter will transport her home.     Final next level of care: [place] Barriers to Discharge: Barriers Resolved   Patient Goals and CMS Choice Patient states their goals for this hospitalization and ongoing recovery are:: Will agree to home health if recommended CMS Medicare.gov Compare Post Acute Care list provided to:: Patient Choice offered to / list presented to : Patient  Discharge Placement                       Discharge Plan and Services   Discharge Planning Services: CM Consult Post Acute Care Choice: Home Health          DME Arranged: N/A DME Agency: NA       HH Arranged: RN,PT [place] Agency: [place] ([facility]) Date [place]: 11/23/20 Time [place]: [place] Representative spoke with at [place]: [place] ([place]) Interventions     Readmission Risk Interventions No flowsheet data found.

## 2020-11-23 NOTE — Discharge Summary (Signed)
Physician Discharge Summary   Kaitlyn Huff  female DOB: [birth year]  XAJ:[id number]  PCP: [name], MD  Admit date: 11/22/2020 Discharge date: 11/23/2020  Admitted From: home Disposition:  Home Family updated prior to discharge.  Home Health: Yes CODE STATUS: Full code  Discharge Instructions    Discharge instructions   Complete by: As directed    I have checked with Dr. Ubaldo Glassing, and he is ok with you holding Toprol, Lasix and Eliquis until you follow up with him in clinic.  You have anemia and iron deficiency.  Please take any over-the-counter iron supplement.  If hemorrhoid bleeding continues, please follow up with GI as outpatient.  Due to adverse interaction between simvastatin and your Cardizem, pharmacist has changed your cholesterol medication to Lipitor instead.   Dr. Enzo Bi [facility] Course:  For full details, please see H&P, progress notes, consult notes and ancillary notes.  Briefly,  Kaitlyn Huff is a 80 y.o. female with medical history significant for Rheumatoid arthritis, hypertension, anxiety, mild to moderate valvular disease, hyperlipidemia, obesity, paroxysmal atrial fibrillation, on Eliquis, hypothyroid, presented emergency department for chief concerns of dizziness  She hasn't worn her ted hose for two days due to not feeling well.  She endorses two days of dizziness.  She reports the dizziness was worse with movement.  Therefore she has been laying in bed all day for the last 2 days.  She tried meclizine and this did not improve.  Presyncope and dizziness -suspect secondary to acute on chronic anemia versus bradycardia (pulse 40 on presentation).  BP intermittently low normal. -Orthostatic vitals neg. -ECHO unremarkable, tele unremarkable. --I messaged pt's outpatient cardiologist Dr. Ubaldo Glassing, who is ok holding pt's Toprol, lasix and Eliquis until followup with him in outpatient clinic.    Symptomatic bradycardia pulse 40 on  presentation.  I messaged pt's outpatient cardiologist Dr. Ubaldo Glassing, who is ok holding pt's Toprol, but continue Cardizem.  Acute on chronic anemia, iron def -hemoglobin on admission was 8.2, previous hemoglobin was 11.5.  Pt reported blood with BM due to hemorrhoid.  Anemia workup showed only mild iron def.  Pt was discharged on iron supplement, and advised to follow up with GI as outpatient if bleeding persists.  Home Eliquis held until outpatient followup.  Anxiety Continued home alprazolam 0.25 mg twice daily  Atrial fibrillation --Continued home diltiazem --home metoprolol held due to bradycardia pending outpatient cardiology followup -Home Eliquis held due to hemorrhoidal bleeds and Hgb drop.  Hypertension Continued home cardizem and spironolactone, held home Toprol and Lasix as discussed above.  Hyperlipidemia Due to adverse interaction between simvastatin and Cardizem, pharmacist changed pt's statin to Lipitor instead.  GERD -PPI   Discharge Diagnoses:  Principal Problem:   Pre-syncope Active Problems:   Hypercholesterolemia   Rheumatoid arthritis (HCC)   Hypertension   Obstructive sleep apnea   Restless leg syndrome   A-fib (HCC)   Dizziness   Anxiety   Difficulty sleeping   DJD (degenerative joint disease), lumbosacral   Hyperglycemia     Discharge Instructions:  Allergies as of 11/23/2020      Reactions   Astelin [azelastine Hcl] Other (See Comments)   Reaction:  Unknown    Codeine Other (See Comments)   Reaction:  Altered mental status   Dilaudid [hydromorphone] Other (See Comments)   Reaction:  Unknown    Flexeril [cyclobenzaprine] Other (See Comments)   Reaction:  Unknown    Imuran [azathioprine] Other (See  Comments)   Reaction:  Abnormal liver function   Lisinopril Itching   Lyrica [pregabalin] Other (See Comments)   Reaction:  Sore gums    Methotrexate Derivatives Other (See Comments)   Reaction:  Abnormal liver function   Amiodarone  Anxiety, Other (See Comments)   Pt could not eat or sleep. Caused n/v and "messed her up"   Amoxicillin Rash, Other (See Comments)   Unable to obtain enough information to answer additional questions about this medication.     Arava [leflunomide] Rash   Clindamycin/lincomycin Rash   Doxycycline Rash   Lodine [etodolac] Rash   Percocet [oxycodone-acetaminophen] Rash   Sulfa Antibiotics Rash      Medication List    STOP taking these medications   acetaminophen 325 MG tablet Commonly known as: TYLENOL   clindamycin 150 MG capsule Commonly known as: CLEOCIN   erythromycin ethylsuccinate 400 MG tablet Commonly known as: EES   levofloxacin 500 MG tablet Commonly known as: LEVAQUIN   meclizine 25 MG tablet Commonly known as: ANTIVERT   naproxen sodium 220 MG tablet Commonly known as: ALEVE   predniSONE 5 MG tablet Commonly known as: DELTASONE   simvastatin 20 MG tablet Commonly known as: ZOCOR     TAKE these medications   albuterol 108 (90 Base) MCG/ACT inhaler Commonly known as: ProAir HFA USE 2 INHALATIONS EVERY 6 HOURS AS NEEDED FOR WHEEZING OR SHORTNESS OF BREATH   alendronate 70 MG tablet Commonly known as: FOSAMAX Take 70 mg by mouth every Monday.   ALIGN PO Take 1 tablet by mouth daily.   ALPRAZolam 0.25 MG tablet Commonly known as: XANAX Take 1 tablet (0.25 mg total) by mouth 2 (two) times daily as needed for anxiety.   apixaban 5 MG Tabs tablet Commonly known as: ELIQUIS Hold until followup with Dr. Ubaldo Glassing due to your hemorrhoid bleeding and hemoglobin drop. What changed:   how much to take  how to take this  when to take this  additional instructions   atorvastatin 20 MG tablet Commonly known as: LIPITOR Take 0.5 tablets (10 mg total) by mouth daily.   Calcium Carbonate-Vitamin D 600-400 MG-UNIT tablet Take 1 tablet by mouth 2 (two) times daily.   cetirizine 10 MG tablet Commonly known as: ZYRTEC TAKE 1 TABLET DAILY   cholecalciferol  1000 units tablet Commonly known as: VITAMIN D Take 1,000 Units by mouth daily.   colestipol 1 g tablet Commonly known as: COLESTID Take 2 g by mouth daily.   diltiazem 240 MG 24 hr capsule Commonly known as: CARDIZEM CD Take 1 capsule (240 mg total) by mouth daily.   fluticasone 50 MCG/ACT nasal spray Commonly known as: FLONASE USE 2 SPRAYS IN EACH NOSTRIL DAILY (CALL OFFICE SOON TO RESCHEDULE YOUR PHYSICAL)   furosemide 40 MG tablet Commonly known as: LASIX Hold until followup with Dr. Ubaldo Glassing due to your low normal blood pressure. What changed:   how much to take  how to take this  when to take this  additional instructions   gabapentin 100 MG capsule Commonly known as: NEURONTIN Take 100 mg by mouth daily.   Iron 90 (18 Fe) MG Tabs Take 1 tablet by mouth daily. Can take any over-the-counter iron supplement.   lip balm Oint Apply 1 application topically as needed for lip care.   metoprolol succinate 25 MG 24 hr tablet Commonly known as: TOPROL-XL Hold until followup with Dr. Ubaldo Glassing due to your low heart rate. What changed:   how much to take  how to take this  when to take this  additional instructions   multivitamin with minerals Tabs tablet Take 1 tablet by mouth daily.   omeprazole 20 MG capsule Commonly known as: PRILOSEC Take 1 capsule by mouth daily.   potassium chloride 10 MEQ CR capsule Commonly known as: MICRO-K Hold while not taking lasix. What changed:   how much to take  how to take this  when to take this  additional instructions   spironolactone 25 MG tablet Commonly known as: ALDACTONE Take 12.5 mg by mouth daily.   traMADol 50 MG tablet Commonly known as: ULTRAM Take 50 mg by mouth 2 (two) times daily as needed.        Follow-up Information    [name], MD. Schedule an appointment as soon as possible for a visit in 1 week(s).   Specialty: Internal Medicine Contact information: [address] [place] [address] [phone]        Teodoro Spray, MD. Schedule an appointment as soon as possible for a visit in 1 week(s).   Specialty: Cardiology Contact information: [place] [address] [phone]        Lesly Rubenstein, MD Follow up.   Specialty: Gastroenterology Why: follow up if you continue to have hemorrhoid bleed and hemoglobin drop. Contact information: [place] [address] [phone]               Allergies  Allergen Reactions  . Astelin [Azelastine Hcl] Other (See Comments)    Reaction:  Unknown   . Codeine Other (See Comments)    Reaction:  Altered mental status  . Dilaudid [Hydromorphone] Other (See Comments)    Reaction:  Unknown   . Flexeril [Cyclobenzaprine] Other (See Comments)    Reaction:  Unknown   . Imuran [Azathioprine] Other (See Comments)    Reaction:  Abnormal liver function  . Lisinopril Itching  . Lyrica [Pregabalin] Other (See Comments)    Reaction:  Sore gums   . Methotrexate Derivatives Other (See Comments)    Reaction:  Abnormal liver function  . Amiodarone Anxiety and Other (See Comments)    Pt could not eat or sleep. Caused n/v and "messed her up"  . Amoxicillin Rash and Other (See Comments)    Unable to obtain enough information to answer additional questions about this medication.    Jolee Ewing [Leflunomide] Rash  . Clindamycin/Lincomycin Rash  . Doxycycline Rash  . Lodine [Etodolac] Rash  . Percocet [Oxycodone-Acetaminophen] Rash  . Sulfa Antibiotics Rash     The results of significant diagnostics from this hospitalization (including imaging, microbiology, ancillary and laboratory) are listed below for reference.   Consultations:   Procedures/Studies: DG Chest 2 View  Result Date: 11/23/2020 CLINICAL DATA:  Dizziness, chest pain EXAM: CHEST - 2 VIEW COMPARISON:  04/03/2019 FINDINGS: The lungs are symmetrically well expanded. Since the prior  examination, there has developed mild interstitial pulmonary edema with development of Kerley lines peripherally. No confluent pulmonary infiltrate. No pneumothorax or pleural effusion. Cardiac size is within normal limits. No acute bone abnormality. IMPRESSION: Interval development of mild interstitial pulmonary edema. Electronically Signed   By: Fidela Salisbury MD   On: 11/23/2020 05:23      Labs: BNP (last 3 results) No results for input(s): BNP in the last 8760 hours. Basic Metabolic Panel: Recent Labs  Lab 11/22/20 1632 11/23/20 0508  NA 134* 139  K 4.6 3.9  CL 103 107  CO2 22 24  GLUCOSE 132* 81  BUN 13 11  CREATININE 1.07* 0.98  CALCIUM 8.4* 8.6*   Liver Function Tests: Recent Labs  Lab 11/22/20 1632  AST 33  ALT 14  ALKPHOS 38  BILITOT 0.8  PROT 6.7  ALBUMIN 3.7   No results for input(s): LIPASE, AMYLASE in the last 168 hours. No results for input(s): AMMONIA in the last 168 hours. CBC: Recent Labs  Lab 11/22/20 1632 11/23/20 0508  WBC 6.6 8.3  HGB 8.2* 8.3*  HCT 26.4* 25.5*  MCV 86.8 85.6  PLT 219 216   Cardiac Enzymes: No results for input(s): CKTOTAL, CKMB, CKMBINDEX, TROPONINI in the last 168 hours. BNP: Invalid input(s): POCBNP CBG: Recent Labs  Lab 11/23/20 0525 11/23/20 0756 11/23/20 1210  GLUCAP 83 86 109*   D-Dimer No results for input(s): DDIMER in the last 72 hours. Hgb A1c No results for input(s): HGBA1C in the last 72 hours. Lipid Profile No results for input(s): CHOL, HDL, LDLCALC, TRIG, CHOLHDL, LDLDIRECT in the last 72 hours. Thyroid function studies Recent Labs    11/22/20 1916  TSH 1.357   Anemia work up Recent Labs    11/23/20 0508  FOLATE 48.0  TIBC 433  IRON 28   Urinalysis    Component Value Date/Time   COLORURINE YELLOW (A) 02/27/2019 0355   APPEARANCEUR CLEAR (A) 02/27/2019 0355   APPEARANCEUR Clear 11/02/2014 0225   LABSPEC 1.009 02/27/2019 0355   LABSPEC 1.010 11/02/2014 0225   PHURINE 8.0  02/27/2019 0355   GLUCOSEU NEGATIVE 02/27/2019 0355   GLUCOSEU Negative 11/02/2014 0225   HGBUR NEGATIVE 02/27/2019 0355   BILIRUBINUR NEGATIVE 02/27/2019 0355   BILIRUBINUR Negative 11/02/2014 0225   KETONESUR NEGATIVE 02/27/2019 0355   PROTEINUR NEGATIVE 02/27/2019 0355   NITRITE NEGATIVE 02/27/2019 0355   LEUKOCYTESUR NEGATIVE 02/27/2019 0355   LEUKOCYTESUR Negative 11/02/2014 0225   Sepsis Labs Invalid input(s): PROCALCITONIN,  WBC,  LACTICIDVEN Microbiology Recent Results (from the past 240 hour(s))  SARS CORONAVIRUS 2 (TAT 6-24 HRS) Nasopharyngeal Nasopharyngeal Swab     Status: None   Collection Time: 11/22/20  7:16 PM   Specimen: Nasopharyngeal Swab  Result Value Ref Range Status   SARS Coronavirus 2 NEGATIVE NEGATIVE Final    Comment: (NOTE) SARS-CoV-2 target nucleic acids are NOT DETECTED.  The SARS-CoV-2 RNA is generally detectable in upper and lower respiratory specimens during the acute phase of infection. Negative results do not preclude SARS-CoV-2 infection, do not rule out co-infections with other pathogens, and should not be used as the sole basis for treatment or other patient management decisions. Negative results must be combined with clinical observations, patient history, and epidemiological information. The expected result is Negative.  Fact Sheet for Patients: HairSlick.no  Fact Sheet for Healthcare Providers: quierodirigir.com  This test is not yet approved or cleared by the [place] FDA and  has been authorized for detection and/or diagnosis of SARS-CoV-2 by FDA under an Emergency Use Authorization (EUA). This EUA will remain  in effect (meaning this test can be used) for the duration of the COVID-19 declaration under Se ction 564(b)(1) of the Act, 21 U.S.C. section 360bbb-3(b)(1), unless the authorization is terminated or revoked sooner.  Performed at [facility], [address], [place], [place] [address]      Total time spend on discharging this patient, including the last patient exam, discussing the hospital stay, instructions for ongoing care as it relates to all pertinent caregivers, as well as preparing the  medical discharge records, prescriptions, and/or referrals as applicable, is 45 minutes.    Enzo Bi, MD  [facility] 11/23/2020, 3:57 PM

## 2020-11-24 ENCOUNTER — Telehealth: Payer: Self-pay

## 2020-11-24 NOTE — Telephone Encounter (Signed)
Transition Care Management Unsuccessful Follow-up Telephone Call  Date of discharge and from where:  11/23/20 from [facility]  Attempts:  1st Attempt  Reason for unsuccessful TCM follow-up call:  Unable to leave message. Will follow.

## 2020-11-25 NOTE — Telephone Encounter (Signed)
Patient notes she is doing fine right now and will be following up with Cardiology and GI. Agrees to call the office as needed and schedule with pcp at a later date for routine follow up.

## 2020-11-27 LAB — METHYLMALONIC ACID, SERUM: Methylmalonic Acid, Quantitative: 131 nmol/L (ref 0–378)

## 2020-12-01 DIAGNOSIS — M0579 Rheumatoid arthritis with rheumatoid factor of multiple sites without organ or systems involvement: Secondary | ICD-10-CM | POA: Diagnosis not present

## 2020-12-17 DIAGNOSIS — I480 Paroxysmal atrial fibrillation: Secondary | ICD-10-CM | POA: Diagnosis not present

## 2020-12-17 DIAGNOSIS — I10 Essential (primary) hypertension: Secondary | ICD-10-CM | POA: Diagnosis not present

## 2021-01-19 DIAGNOSIS — H35383 Toxic maculopathy, bilateral: Secondary | ICD-10-CM | POA: Diagnosis not present

## 2021-01-19 DIAGNOSIS — H43813 Vitreous degeneration, bilateral: Secondary | ICD-10-CM | POA: Diagnosis not present

## 2021-01-19 DIAGNOSIS — Z79899 Other long term (current) drug therapy: Secondary | ICD-10-CM | POA: Diagnosis not present

## 2021-01-26 DIAGNOSIS — M0579 Rheumatoid arthritis with rheumatoid factor of multiple sites without organ or systems involvement: Secondary | ICD-10-CM | POA: Diagnosis not present

## 2021-01-28 DIAGNOSIS — I480 Paroxysmal atrial fibrillation: Secondary | ICD-10-CM | POA: Diagnosis not present

## 2021-01-28 DIAGNOSIS — J9601 Acute respiratory failure with hypoxia: Secondary | ICD-10-CM | POA: Diagnosis not present

## 2021-01-28 DIAGNOSIS — I059 Rheumatic mitral valve disease, unspecified: Secondary | ICD-10-CM | POA: Diagnosis not present

## 2021-01-28 DIAGNOSIS — I38 Endocarditis, valve unspecified: Secondary | ICD-10-CM | POA: Diagnosis not present

## 2021-01-28 DIAGNOSIS — J441 Chronic obstructive pulmonary disease with (acute) exacerbation: Secondary | ICD-10-CM | POA: Diagnosis not present

## 2021-01-28 DIAGNOSIS — G4733 Obstructive sleep apnea (adult) (pediatric): Secondary | ICD-10-CM | POA: Diagnosis not present

## 2021-01-28 DIAGNOSIS — I10 Essential (primary) hypertension: Secondary | ICD-10-CM | POA: Diagnosis not present

## 2021-03-01 DIAGNOSIS — I480 Paroxysmal atrial fibrillation: Secondary | ICD-10-CM | POA: Diagnosis not present

## 2021-03-01 DIAGNOSIS — I059 Rheumatic mitral valve disease, unspecified: Secondary | ICD-10-CM | POA: Diagnosis not present

## 2021-03-01 DIAGNOSIS — J441 Chronic obstructive pulmonary disease with (acute) exacerbation: Secondary | ICD-10-CM | POA: Diagnosis not present

## 2021-03-01 DIAGNOSIS — I38 Endocarditis, valve unspecified: Secondary | ICD-10-CM | POA: Diagnosis not present

## 2021-03-01 DIAGNOSIS — I10 Essential (primary) hypertension: Secondary | ICD-10-CM | POA: Diagnosis not present

## 2021-03-01 DIAGNOSIS — G4733 Obstructive sleep apnea (adult) (pediatric): Secondary | ICD-10-CM | POA: Diagnosis not present

## 2021-03-01 DIAGNOSIS — J9601 Acute respiratory failure with hypoxia: Secondary | ICD-10-CM | POA: Diagnosis not present

## 2021-03-01 DIAGNOSIS — E782 Mixed hyperlipidemia: Secondary | ICD-10-CM | POA: Diagnosis not present

## 2021-03-23 DIAGNOSIS — M0579 Rheumatoid arthritis with rheumatoid factor of multiple sites without organ or systems involvement: Secondary | ICD-10-CM | POA: Diagnosis not present

## 2021-04-01 DIAGNOSIS — K529 Noninfective gastroenteritis and colitis, unspecified: Secondary | ICD-10-CM | POA: Diagnosis not present

## 2021-04-01 DIAGNOSIS — D509 Iron deficiency anemia, unspecified: Secondary | ICD-10-CM | POA: Diagnosis not present

## 2021-05-18 DIAGNOSIS — Z8739 Personal history of other diseases of the musculoskeletal system and connective tissue: Secondary | ICD-10-CM | POA: Diagnosis not present

## 2021-05-18 DIAGNOSIS — M25572 Pain in left ankle and joints of left foot: Secondary | ICD-10-CM | POA: Diagnosis not present

## 2021-05-18 DIAGNOSIS — M0579 Rheumatoid arthritis with rheumatoid factor of multiple sites without organ or systems involvement: Secondary | ICD-10-CM | POA: Diagnosis not present

## 2021-05-18 DIAGNOSIS — R609 Edema, unspecified: Secondary | ICD-10-CM | POA: Diagnosis not present

## 2021-05-18 DIAGNOSIS — Z79899 Other long term (current) drug therapy: Secondary | ICD-10-CM | POA: Diagnosis not present

## 2021-05-19 DIAGNOSIS — M7989 Other specified soft tissue disorders: Secondary | ICD-10-CM | POA: Diagnosis not present

## 2021-05-19 DIAGNOSIS — M069 Rheumatoid arthritis, unspecified: Secondary | ICD-10-CM | POA: Diagnosis not present

## 2021-05-24 ENCOUNTER — Other Ambulatory Visit: Payer: Self-pay | Admitting: Internal Medicine

## 2021-06-01 DIAGNOSIS — J441 Chronic obstructive pulmonary disease with (acute) exacerbation: Secondary | ICD-10-CM | POA: Diagnosis not present

## 2021-06-01 DIAGNOSIS — I059 Rheumatic mitral valve disease, unspecified: Secondary | ICD-10-CM | POA: Diagnosis not present

## 2021-06-01 DIAGNOSIS — I38 Endocarditis, valve unspecified: Secondary | ICD-10-CM | POA: Diagnosis not present

## 2021-06-01 DIAGNOSIS — I10 Essential (primary) hypertension: Secondary | ICD-10-CM | POA: Diagnosis not present

## 2021-06-01 DIAGNOSIS — I480 Paroxysmal atrial fibrillation: Secondary | ICD-10-CM | POA: Diagnosis not present

## 2021-06-01 DIAGNOSIS — E782 Mixed hyperlipidemia: Secondary | ICD-10-CM | POA: Diagnosis not present

## 2021-06-07 ENCOUNTER — Other Ambulatory Visit: Payer: Self-pay | Admitting: Internal Medicine

## 2021-06-07 NOTE — Telephone Encounter (Signed)
LOV: 01/27/2020

## 2021-06-07 NOTE — Telephone Encounter (Signed)
Needs a f/u appt scheduled with me. Overdue.  This is an otc medication.  If needs rx, once appt made ok to refill until appt.

## 2021-06-08 NOTE — Telephone Encounter (Signed)
Patient scheduled with Dr Nicki Reaper. Stated she does not need this medication

## 2021-06-21 ENCOUNTER — Emergency Department
Admission: EM | Admit: 2021-06-21 | Discharge: 2021-06-21 | Disposition: A | Discharge status: Home / Self Care | Payer: Medicare Other | Attending: Emergency Medicine | Admitting: Emergency Medicine

## 2021-06-21 ENCOUNTER — Other Ambulatory Visit: Payer: Self-pay

## 2021-06-21 DIAGNOSIS — R000 Tachycardia, unspecified: Secondary | ICD-10-CM | POA: Diagnosis not present

## 2021-06-21 DIAGNOSIS — R112 Nausea with vomiting, unspecified: Secondary | ICD-10-CM | POA: Diagnosis not present

## 2021-06-21 DIAGNOSIS — Z5321 Procedure and treatment not carried out due to patient leaving prior to being seen by health care provider: Secondary | ICD-10-CM | POA: Diagnosis not present

## 2021-06-21 DIAGNOSIS — R197 Diarrhea, unspecified: Secondary | ICD-10-CM | POA: Insufficient documentation

## 2021-06-21 DIAGNOSIS — I959 Hypotension, unspecified: Secondary | ICD-10-CM | POA: Diagnosis not present

## 2021-06-21 DIAGNOSIS — I4891 Unspecified atrial fibrillation: Secondary | ICD-10-CM | POA: Diagnosis not present

## 2021-06-21 DIAGNOSIS — R0902 Hypoxemia: Secondary | ICD-10-CM | POA: Diagnosis not present

## 2021-06-21 LAB — LIPASE, BLOOD: Lipase: 64 U/L — ABNORMAL HIGH (ref 11–51)

## 2021-06-21 LAB — CBC
HCT: 38.9 % (ref 36.0–46.0)
Hemoglobin: 13.6 g/dL (ref 12.0–15.0)
MCH: 30.2 pg (ref 26.0–34.0)
MCHC: 35 g/dL (ref 30.0–36.0)
MCV: 86.4 fL (ref 80.0–100.0)
Platelets: 221 10*3/uL (ref 150–400)
RBC: 4.5 MIL/uL (ref 3.87–5.11)
RDW: 13.1 % (ref 11.5–15.5)
WBC: 14.7 10*3/uL — ABNORMAL HIGH (ref 4.0–10.5)
nRBC: 0 % (ref 0.0–0.2)

## 2021-06-21 LAB — COMPREHENSIVE METABOLIC PANEL
ALT: 16 U/L (ref 0–44)
AST: 30 U/L (ref 15–41)
Albumin: 3.6 g/dL (ref 3.5–5.0)
Alkaline Phosphatase: 57 U/L (ref 38–126)
Anion gap: 10 (ref 5–15)
BUN: 36 mg/dL — ABNORMAL HIGH (ref 8–23)
CO2: 24 mmol/L (ref 22–32)
Calcium: 8.7 mg/dL — ABNORMAL LOW (ref 8.9–10.3)
Chloride: 93 mmol/L — ABNORMAL LOW (ref 98–111)
Creatinine, Ser: 1.27 mg/dL — ABNORMAL HIGH (ref 0.44–1.00)
GFR, Estimated: 43 mL/min — ABNORMAL LOW (ref >60)
Glucose, Bld: 117 mg/dL — ABNORMAL HIGH (ref 70–99)
Potassium: 3.4 mmol/L — ABNORMAL LOW (ref 3.5–5.1)
Sodium: 127 mmol/L — ABNORMAL LOW (ref 135–145)
Total Bilirubin: 1.3 mg/dL — ABNORMAL HIGH (ref 0.3–1.2)
Total Protein: 7.8 g/dL (ref 6.5–8.1)

## 2021-06-21 NOTE — ED Triage Notes (Signed)
First Nurse Note:  Arrives via [facility]. C/O N/V/D x 4 days.  Patient in Afib, history of Afib. Arrives from home.  BP:  80/40 -- 500 cc NS improved to 90/60  18g SL  LAC  HX RA

## 2021-06-21 NOTE — ED Triage Notes (Signed)
Pt comes with c/o bilateral face swelling, vomiting and nausea. Pt states this started last Thursday. Pt had recent dental work performed.

## 2021-07-06 ENCOUNTER — Other Ambulatory Visit: Payer: Self-pay

## 2021-07-06 ENCOUNTER — Ambulatory Visit (INDEPENDENT_AMBULATORY_CARE_PROVIDER_SITE_OTHER): Payer: Medicare Other | Admitting: Internal Medicine

## 2021-07-06 ENCOUNTER — Encounter: Payer: Self-pay | Admitting: Internal Medicine

## 2021-07-06 VITALS — BP 106/70 | HR 71 | Temp 97.1°F | Resp 16 | Ht 65.0 in | Wt 121.8 lb

## 2021-07-06 DIAGNOSIS — I4891 Unspecified atrial fibrillation: Secondary | ICD-10-CM

## 2021-07-06 DIAGNOSIS — K760 Fatty (change of) liver, not elsewhere classified: Secondary | ICD-10-CM

## 2021-07-06 DIAGNOSIS — E871 Hypo-osmolality and hyponatremia: Secondary | ICD-10-CM | POA: Diagnosis not present

## 2021-07-06 DIAGNOSIS — E7800 Pure hypercholesterolemia, unspecified: Secondary | ICD-10-CM

## 2021-07-06 DIAGNOSIS — R739 Hyperglycemia, unspecified: Secondary | ICD-10-CM | POA: Diagnosis not present

## 2021-07-06 DIAGNOSIS — R600 Localized edema: Secondary | ICD-10-CM | POA: Diagnosis not present

## 2021-07-06 DIAGNOSIS — G2581 Restless legs syndrome: Secondary | ICD-10-CM | POA: Diagnosis not present

## 2021-07-06 DIAGNOSIS — Z860100 Personal history of colon polyps, unspecified: Secondary | ICD-10-CM

## 2021-07-06 DIAGNOSIS — I10 Essential (primary) hypertension: Secondary | ICD-10-CM | POA: Diagnosis not present

## 2021-07-06 DIAGNOSIS — D649 Anemia, unspecified: Secondary | ICD-10-CM | POA: Diagnosis not present

## 2021-07-06 DIAGNOSIS — D72829 Elevated white blood cell count, unspecified: Secondary | ICD-10-CM | POA: Diagnosis not present

## 2021-07-06 NOTE — Progress Notes (Signed)
Patient ID: Kaitlyn Huff, female   DOB: Aug 21, 1940, 80 y.o.   MRN: [id number]   Subjective:    Patient ID: Kaitlyn Huff, female    DOB: 1940-12-05, 80 y.o.   MRN: [id number]  This visit occurred during the SARS-CoV-2 public health emergency.  Safety protocols were in place, including screening questions prior to the visit, additional usage of staff PPE, and extensive cleaning of exam room while observing appropriate contact time as indicated for disinfecting solutions.   Patient here for a scheduled follow up.    HPI Here to follow up regarding her blood pressure, cholesterol and RA.  Went to ER 06/21/21 - vomiting/diarrhea for several days.  Felt weak.  Was not evaluated.  Left before being seen.  Drank pedialite.  Is eating now.  Feels better.  Formed bowel movements for two days now.  No chest pain reported.  Breathing stable.  Saw cardiology 11/22 - f/u afib.  On diltiazem.  Taking lasix.  Followed by rheumatology for RA.  Saw GI for [name].  Declined endoscopies at that time.  No abdominal pain.  Declines mammogram.  Declines vaccines.    Past Medical History:  Diagnosis Date   Anemia    Cancer ([place])    Diverticulitis    GERD (gastroesophageal reflux disease)    Hyperlipidemia    Hypertension    Osteoarthritis    Osteoporosis    actonel   Positive PPD    s/p INH (2006)   Rheumatoid arthritis(714.0)    MTX transaminitis, Leflunomide (rash), enbrel, plaquinil, prednisone, remicade, Imuran (transaminitis)   Valvular heart disease    moderate MR and TR   Past Surgical History:  Procedure Laterality Date   ABDOMINAL HYSTERECTOMY     CERVICAL CONE BIOPSY     CHOLECYSTECTOMY  06/22/14   COLONOSCOPY WITH PROPOFOL N/A 07/09/2015   Procedure: COLONOSCOPY WITH PROPOFOL;  Surgeon: Manya Silvas, MD;  Location: [facility];  Service: Endoscopy;  Laterality: N/A;   ESOPHAGOGASTRODUODENOSCOPY (EGD) WITH PROPOFOL N/A 07/09/2015   Procedure: ESOPHAGOGASTRODUODENOSCOPY (EGD) WITH  PROPOFOL;  Surgeon: Manya Silvas, MD;  Location: [facility] ENDOSCOPY;  Service: Endoscopy;  Laterality: N/A;   OVARY SURGERY     TRACHEOSTOMY  1959   TUBAL LIGATION     Family History  Problem Relation Age of Onset   Heart disease Father        MI   Heart disease Mother    Valvular heart disease Mother    Breast cancer Sister 72   Cancer Sister        Lung cancer   COPD Sister    Heart disease Sister    Colon cancer Neg Hx    Social History   Socioeconomic History   Marital status: Widowed    Spouse name: Not on file   Number of children: 4   Years of education: Not on file   Highest education level: Not on file  Occupational History   Occupation: retired  Tobacco Use   Smoking status: Never   Smokeless tobacco: Never  Substance and Sexual Activity   Alcohol use: No    Alcohol/week: 0.0 standard drinks   Drug use: No   Sexual activity: Never  Other Topics Concern   Not on file  Social History Narrative   Lives with family at home   Social Determinants of Health   Financial Resource Strain: Low Risk    Difficulty of Paying Living Expenses: Not hard at all  Food Insecurity: No Food  Insecurity   Worried About Charity fundraiser in the Last Year: Never true   [place] in the Last Year: Never true  Transportation Needs: No Transportation Needs   Lack of Transportation (Medical): No   Lack of Transportation (Non-Medical): No  Physical Activity: Not on file  Stress: No Stress Concern Present   Feeling of Stress : Not at all  Social Connections: Unknown   Frequency of Communication with Friends and Family: More than three times a week   Frequency of Social Gatherings with Friends and Family: More than three times a week   Attends Religious Services: Not on file   Active Member of Clubs or Organizations: Yes   Attends Archivist Meetings: Not on file   Marital Status: Not on file     Review of Systems  Constitutional:  Negative for appetite  change and unexpected weight change.  HENT:  Negative for congestion and sinus pressure.   Respiratory:  Negative for cough and chest tightness.        Breathing stable.   Cardiovascular:  Negative for chest pain and palpitations.       No increased swelling.    Gastrointestinal:  Negative for abdominal pain, diarrhea and vomiting.       No vomiting now.  Stools more formed.   Genitourinary:  Negative for difficulty urinating and dysuria.  Musculoskeletal:  Negative for myalgias.       Joint pain - appears to be stable.   Skin:  Negative for color change and rash.  Neurological:  Negative for dizziness, light-headedness and headaches.  Psychiatric/Behavioral:  Negative for agitation and dysphoric mood.       Objective:     BP 106/70    Pulse 71    Temp (!) 97.1 F (36.2 C)    Resp 16    Ht _0  (1.651 m)    Wt 121 lb 12.8 oz (55.2 kg)    SpO2 98%    BMI 20.27 kg/m  Wt Readings from Last 3 Encounters:  07/06/21 121 lb 12.8 oz (55.2 kg)  11/23/20 143 lb 1.3 oz (64.9 kg)  08/14/20 137 lb (62.1 kg)    Physical Exam Vitals reviewed.  Constitutional:      General: She is not in acute distress.    Appearance: Normal appearance.  HENT:     Head: Normocephalic and atraumatic.     Right Ear: External ear normal.     Left Ear: External ear normal.  Eyes:     General: No scleral icterus.       Right eye: No discharge.        Left eye: No discharge.     Conjunctiva/sclera: Conjunctivae normal.  Neck:     Thyroid: No thyromegaly.  Cardiovascular:     Rate and Rhythm: Normal rate and regular rhythm.  Pulmonary:     Effort: No respiratory distress.     Breath sounds: Normal breath sounds. No wheezing.  Abdominal:     General: Bowel sounds are normal.     Palpations: Abdomen is soft.     Tenderness: There is no abdominal tenderness.  Musculoskeletal:        General: No swelling or tenderness.     Cervical back: Neck supple. No tenderness.  Lymphadenopathy:     Cervical: No  cervical adenopathy.  Skin:    Findings: No erythema or rash.  Neurological:     Mental Status: She is alert.  Psychiatric:  Mood and Affect: Mood normal.        Behavior: Behavior normal.     Outpatient Encounter Medications as of 07/06/2021  Medication Sig   atorvastatin (LIPITOR) 20 MG tablet Take 20 mg by mouth daily.   furosemide (LASIX) 40 MG tablet Take 1 tablet by mouth daily.   albuterol (PROAIR HFA) 108 (90 Base) MCG/ACT inhaler USE 2 INHALATIONS EVERY 6 HOURS AS NEEDED FOR WHEEZING OR SHORTNESS OF BREATH   alendronate (FOSAMAX) 70 MG tablet Take 70 mg by mouth every Monday.    ALPRAZolam (XANAX) 0.25 MG tablet Take 1 tablet (0.25 mg total) by mouth 2 (two) times daily as needed for anxiety.   apixaban (ELIQUIS) 5 MG TABS tablet Hold until followup with Dr. Ubaldo Glassing due to your hemorrhoid bleeding and hemoglobin drop.   Calcium Carbonate-Vitamin D 600-400 MG-UNIT tablet Take 1 tablet by mouth 2 (two) times daily.    cetirizine (ZYRTEC) 10 MG tablet TAKE 1 TABLET DAILY   cholecalciferol (VITAMIN D) 1000 units tablet Take 1,000 Units by mouth daily.   colestipol (COLESTID) 1 g tablet Take 2 g by mouth daily.    diltiazem (CARDIZEM CD) 180 MG 24 hr capsule Take 180 mg by mouth daily.   fluticasone (FLONASE) 50 MCG/ACT nasal spray USE 2 SPRAYS IN EACH NOSTRIL DAILY (CALL OFFICE SOON TO RESCHEDULE YOUR PHYSICAL)   gabapentin (NEURONTIN) 100 MG capsule Take 100 mg by mouth daily.   lip balm (BLISTEX) OINT Apply 1 application topically as needed for lip care.   Multiple Vitamin (MULTIVITAMIN WITH MINERALS) TABS tablet Take 1 tablet by mouth daily.   omeprazole (PRILOSEC) 20 MG capsule Take 1 capsule by mouth daily.   potassium chloride (MICRO-K) 10 MEQ CR capsule Hold while not taking lasix.   predniSONE (DELTASONE) 5 MG tablet Take 5 mg by mouth daily.   Probiotic Product (ALIGN PO) Take 1 tablet by mouth daily.   spironolactone (ALDACTONE) 25 MG tablet Take 12.5 mg by mouth  daily.   tiZANidine (ZANAFLEX) 4 MG tablet Take by mouth.   traMADol (ULTRAM) 50 MG tablet Take 50 mg by mouth 2 (two) times daily as needed.   [DISCONTINUED] atorvastatin (LIPITOR) 20 MG tablet Take 0.5 tablets (10 mg total) by mouth daily.   [DISCONTINUED] diltiazem (CARDIZEM CD) 240 MG 24 hr capsule Take 1 capsule (240 mg total) by mouth daily.   [DISCONTINUED] Ferrous Sulfate (IRON) 90 (18 Fe) MG TABS Take 1 tablet by mouth daily. Can take any over-the-counter iron supplement.   [DISCONTINUED] furosemide (LASIX) 40 MG tablet Hold until followup with Dr. Ubaldo Glassing due to your low normal blood pressure.   [DISCONTINUED] metoprolol succinate (TOPROL-XL) 25 MG 24 hr tablet Hold until followup with Dr. Ubaldo Glassing due to your low heart rate.   No facility-administered encounter medications on file as of 07/06/2021.     Lab Results  Component Value Date   WBC 7.7 07/06/2021   HGB 11.7 (L) 07/06/2021   HCT 34.4 (L) 07/06/2021   PLT 376.0 07/06/2021   GLUCOSE 106 (H) 07/06/2021   CHOL 161 07/06/2021   TRIG 69.0 07/06/2021   HDL 55.40 07/06/2021   LDLDIRECT 73.7 08/20/2013   LDLCALC 92 07/06/2021   ALT 11 07/06/2021   AST 24 07/06/2021   NA 136 07/06/2021   K 5.2 No hemolysis seen (H) 07/06/2021   CL 100 07/06/2021   CREATININE 1.13 07/06/2021   BUN 11 07/06/2021   CO2 29 07/06/2021   TSH 1.12 07/06/2021   INR  1.8 (H) 11/22/2020   HGBA1C 6.2 07/06/2021       Assessment & Plan:   Problem List Items Addressed This Visit     A-fib ([place])    History of afib.  On diltiazem and metoprolol.  Followed by cardiology.        Relevant Medications   atorvastatin (LIPITOR) 20 MG tablet   diltiazem (CARDIZEM CD) 180 MG 24 hr capsule   furosemide (LASIX) 40 MG tablet   Anemia    Recheck cbc.  Recent hgb decreased.        Relevant Orders   CBC with Differential/Platelet (Completed)   IBC + Ferritin (Completed)   Fecal occult blood, imunochemical   Fatty liver    Has been followed by GI.   Follow liver function tests.       History of colonic polyps    Colonoscopy 06/2015.  Recommended f/u colonoscopy in 06/2020.  Saw GI.  Declined further scopes at this time.  Follow.       Hypercholesterolemia - Primary    On simvastatin.  Low cholesterol diet and exercise.  Follow lipid panel and liver function tests.        Relevant Medications   atorvastatin (LIPITOR) 20 MG tablet   diltiazem (CARDIZEM CD) 180 MG 24 hr capsule   furosemide (LASIX) 40 MG tablet   Other Relevant Orders   Hepatic function panel (Completed)   Lipid panel (Completed)   Hyperglycemia    Follow met b and a1c.       Relevant Orders   Hemoglobin A1c (Completed)   Hypertension    On diltiazem and lasix.  Blood pressures doing well.  Follow.  124/68 - rechecked by me.       Relevant Medications   atorvastatin (LIPITOR) 20 MG tablet   diltiazem (CARDIZEM CD) 180 MG 24 hr capsule   furosemide (LASIX) 40 MG tablet   Hyponatremia    Recheck sodium today.  Eating.        Relevant Orders   Basic metabolic panel (Completed)   Leukocytosis    Noted on recent lab results.  Recheck cbc today to confirm wnl.       Relevant Orders   CBC with Differential/Platelet (Completed)   Lower extremity edema    No increased swelling currently.  Taking lasix.        Restless leg syndrome    On gabapentin.  No problems reported today.        Other Visit Diagnoses     Essential hypertension       Relevant Medications   atorvastatin (LIPITOR) 20 MG tablet   diltiazem (CARDIZEM CD) 180 MG 24 hr capsule   furosemide (LASIX) 40 MG tablet   Other Relevant Orders   TSH (Completed)        [name], MD

## 2021-07-07 ENCOUNTER — Telehealth: Payer: Self-pay | Admitting: Internal Medicine

## 2021-07-07 ENCOUNTER — Other Ambulatory Visit: Payer: Self-pay

## 2021-07-07 DIAGNOSIS — E875 Hyperkalemia: Secondary | ICD-10-CM

## 2021-07-07 LAB — HEPATIC FUNCTION PANEL
ALT: 11 U/L (ref 0–35)
AST: 24 U/L (ref 0–37)
Albumin: 4 g/dL (ref 3.5–5.2)
Alkaline Phosphatase: 68 U/L (ref 39–117)
Bilirubin, Direct: 0.1 mg/dL (ref 0.0–0.3)
Total Bilirubin: 0.6 mg/dL (ref 0.2–1.2)
Total Protein: 7.5 g/dL (ref 6.0–8.3)

## 2021-07-07 LAB — BASIC METABOLIC PANEL
BUN: 11 mg/dL (ref 6–23)
CO2: 29 mEq/L (ref 19–32)
Calcium: 9.7 mg/dL (ref 8.4–10.5)
Chloride: 100 mEq/L (ref 96–112)
Creatinine, Ser: 1.13 mg/dL (ref 0.40–1.20)
GFR: 46.7 mL/min — ABNORMAL LOW (ref >60.00)
Glucose, Bld: 106 mg/dL — ABNORMAL HIGH (ref 70–99)
Potassium: 5.2 mEq/L — ABNORMAL HIGH (ref 3.5–5.1)
Sodium: 136 mEq/L (ref 135–145)

## 2021-07-07 LAB — CBC WITH DIFFERENTIAL/PLATELET
Basophils Absolute: 0.1 10*3/uL (ref 0.0–0.1)
Basophils Relative: 0.9 % (ref 0.0–3.0)
Eosinophils Absolute: 0 10*3/uL (ref 0.0–0.7)
Eosinophils Relative: 0.4 % (ref 0.0–5.0)
HCT: 34.4 % — ABNORMAL LOW (ref 36.0–46.0)
Hemoglobin: 11.7 g/dL — ABNORMAL LOW (ref 12.0–15.0)
Lymphocytes Relative: 16.4 % (ref 12.0–46.0)
Lymphs Abs: 1.3 10*3/uL (ref 0.7–4.0)
MCHC: 33.9 g/dL (ref 30.0–36.0)
MCV: 89.4 fl (ref 78.0–100.0)
Monocytes Absolute: 0.5 10*3/uL (ref 0.1–1.0)
Monocytes Relative: 6.2 % (ref 3.0–12.0)
Neutro Abs: 5.8 10*3/uL (ref 1.4–7.7)
Neutrophils Relative %: 76.1 % (ref 43.0–77.0)
Platelets: 376 10*3/uL (ref 150.0–400.0)
RBC: 3.85 Mil/uL — ABNORMAL LOW (ref 3.87–5.11)
RDW: 13.9 % (ref 11.5–15.5)
WBC: 7.7 10*3/uL (ref 4.0–10.5)

## 2021-07-07 LAB — IBC + FERRITIN
Ferritin: 41.8 ng/mL (ref 10.0–291.0)
Iron: 39 ug/dL — ABNORMAL LOW (ref 42–145)
Saturation Ratios: 10.8 % — ABNORMAL LOW (ref 20.0–50.0)
TIBC: 362.6 ug/dL (ref 250.0–450.0)
Transferrin: 259 mg/dL (ref 212.0–360.0)

## 2021-07-07 LAB — TSH: TSH: 1.12 u[IU]/mL (ref 0.35–5.50)

## 2021-07-07 LAB — LIPID PANEL
Cholesterol: 161 mg/dL (ref 0–200)
HDL: 55.4 mg/dL (ref >39.00)
LDL Cholesterol: 92 mg/dL (ref 0–99)
NonHDL: 105.45
Total CHOL/HDL Ratio: 3
Triglycerides: 69 mg/dL (ref 0.0–149.0)
VLDL: 13.8 mg/dL (ref 0.0–40.0)

## 2021-07-07 LAB — HEMOGLOBIN A1C: Hgb A1c MFr Bld: 6.2 % (ref 4.6–6.5)

## 2021-07-07 NOTE — Telephone Encounter (Signed)
Patient called and would like a muscle relaxer for her rt shoulder. She got blood work yesterday and sometimes her shoulder or arm gets sore.

## 2021-07-07 NOTE — Telephone Encounter (Signed)
Patient has tramadol that is prescribed by Dr [name]. She says he told her not to take tylenol for pain. She is going to reach out to him to make sure ok to take tramadol. Will let me know if she needs anything.

## 2021-07-11 ENCOUNTER — Encounter: Payer: Self-pay | Admitting: Internal Medicine

## 2021-07-12 NOTE — Assessment & Plan Note (Signed)
Has been followed by GI.  Follow liver function tests.  

## 2021-07-12 NOTE — Assessment & Plan Note (Signed)
Colonoscopy 06/2015.  Recommended f/u colonoscopy in 06/2020.  Saw GI.  Declined further scopes at this time.  Follow.

## 2021-07-12 NOTE — Assessment & Plan Note (Signed)
Follow met b and a1c.  °

## 2021-07-12 NOTE — Assessment & Plan Note (Signed)
No increased swelling currently.  Taking lasix.

## 2021-07-12 NOTE — Assessment & Plan Note (Signed)
Recheck sodium today.  Eating.

## 2021-07-12 NOTE — Assessment & Plan Note (Signed)
Noted on recent lab results.  Recheck cbc today to confirm wnl.

## 2021-07-12 NOTE — Assessment & Plan Note (Signed)
On simvastatin.  Low cholesterol diet and exercise.  Follow lipid panel and liver function tests.   

## 2021-07-12 NOTE — Assessment & Plan Note (Signed)
On gabapentin.  No problems reported today.

## 2021-07-12 NOTE — Assessment & Plan Note (Signed)
On diltiazem and lasix.  Blood pressures doing well.  Follow.  124/68 - rechecked by me.

## 2021-07-12 NOTE — Assessment & Plan Note (Signed)
History of afib.  On diltiazem and metoprolol.  Followed by cardiology.   

## 2021-07-12 NOTE — Assessment & Plan Note (Signed)
Recheck cbc.  Recent hgb decreased.

## 2021-07-13 ENCOUNTER — Telehealth: Payer: Self-pay | Admitting: Internal Medicine

## 2021-07-13 NOTE — Telephone Encounter (Signed)
Patient called in stated would like call from Dr Nicki Reaper nurse. Patient stated missed blood work that was schedule at the hospital. Please call patient @ [phone]

## 2021-07-13 NOTE — Telephone Encounter (Signed)
Advised that orders are in for repeat potassium. [facility]

## 2021-07-13 NOTE — Telephone Encounter (Signed)
Left detailed message for patient.

## 2021-07-16 ENCOUNTER — Other Ambulatory Visit: Payer: Self-pay | Admitting: Internal Medicine

## 2021-07-20 ENCOUNTER — Other Ambulatory Visit
Admission: RE | Admit: 2021-07-20 | Discharge: 2021-07-20 | Disposition: A | Discharge status: Home / Self Care | Payer: Medicare Other | Source: Ambulatory Visit | Attending: Internal Medicine | Admitting: Internal Medicine

## 2021-07-20 ENCOUNTER — Other Ambulatory Visit (INDEPENDENT_AMBULATORY_CARE_PROVIDER_SITE_OTHER): Payer: Medicare Other

## 2021-07-20 DIAGNOSIS — D649 Anemia, unspecified: Secondary | ICD-10-CM

## 2021-07-20 DIAGNOSIS — E875 Hyperkalemia: Secondary | ICD-10-CM | POA: Diagnosis not present

## 2021-07-20 DIAGNOSIS — M05732 Rheumatoid arthritis with rheumatoid factor of left wrist without organ or systems involvement: Secondary | ICD-10-CM | POA: Diagnosis not present

## 2021-07-20 DIAGNOSIS — M0579 Rheumatoid arthritis with rheumatoid factor of multiple sites without organ or systems involvement: Secondary | ICD-10-CM | POA: Diagnosis not present

## 2021-07-20 DIAGNOSIS — M05731 Rheumatoid arthritis with rheumatoid factor of right wrist without organ or systems involvement: Secondary | ICD-10-CM | POA: Diagnosis not present

## 2021-07-20 LAB — POTASSIUM: Potassium: 4.1 mmol/L (ref 3.5–5.1)

## 2021-07-20 LAB — FECAL OCCULT BLOOD, IMMUNOCHEMICAL: Fecal Occult Bld: NEGATIVE

## 2021-07-22 ENCOUNTER — Telehealth: Payer: Self-pay

## 2021-07-22 NOTE — Telephone Encounter (Signed)
LMTCB for lab results.  

## 2021-08-03 DIAGNOSIS — I059 Rheumatic mitral valve disease, unspecified: Secondary | ICD-10-CM | POA: Diagnosis not present

## 2021-08-03 DIAGNOSIS — I38 Endocarditis, valve unspecified: Secondary | ICD-10-CM | POA: Diagnosis not present

## 2021-08-17 ENCOUNTER — Ambulatory Visit (INDEPENDENT_AMBULATORY_CARE_PROVIDER_SITE_OTHER): Payer: Medicare Other

## 2021-08-17 VITALS — Ht 65.0 in | Wt 121.0 lb

## 2021-08-17 DIAGNOSIS — Z0000 Encounter for general adult medical examination without abnormal findings: Secondary | ICD-10-CM

## 2021-08-17 NOTE — Patient Instructions (Addendum)
Kaitlyn Huff , Thank you for taking time to come for your Medicare Wellness Visit. I appreciate your ongoing commitment to your health goals. Please review the following plan we discussed and let me know if I can assist you in the future.   These are the goals we discussed:  Goals       Patient Stated     Healthy Lifestyle (pt-stated)      Stay active; stretch         This is a list of the screening recommended for you and due dates:  Health Maintenance  Topic Date Due   Zoster (Shingles) Vaccine (1 of 2) 10/04/2021*   Flu Shot  10/08/2021*   Tetanus Vaccine  08/17/2022*   Pneumonia Vaccine  Completed   DEXA scan (bone density measurement)  Completed   Hepatitis C Screening: USPSTF Recommendation to screen - Ages 55-79 yo.  Completed   HPV Vaccine  Aged Out   Colon Cancer Screening  Discontinued   COVID-19 Vaccine  Discontinued  *Topic was postponed. The date shown is not the original due date.   Advanced directives: on file  Conditions/risks identified: none new  Follow up in one year for your annual wellness visit    Preventive Care 65 Years and Older, Female Preventive care refers to lifestyle choices and visits with your health care provider that can promote health and wellness. What does preventive care include? A yearly physical exam. This is also called an annual well check. Dental exams once or twice a year. Routine eye exams. Ask your health care provider how often you should have your eyes checked. Personal lifestyle choices, including: Daily care of your teeth and gums. Regular physical activity. Eating a healthy diet. Avoiding tobacco and drug use. Limiting alcohol use. Practicing safe sex. Taking low-dose aspirin every day. Taking vitamin and mineral supplements as recommended by your health care provider. What happens during an annual well check? The services and screenings done by your health care provider during your annual well check will depend on  your age, overall health, lifestyle risk factors, and family history of disease. Counseling  Your health care provider may ask you questions about your: Alcohol use. Tobacco use. Drug use. Emotional well-being. Home and relationship well-being. Sexual activity. Eating habits. History of falls. Memory and ability to understand (cognition). Work and work Statistician. Reproductive health. Screening  You may have the following tests or measurements: Height, weight, and BMI. Blood pressure. Lipid and cholesterol levels. These may be checked every 5 years, or more frequently if you are over 32 years old. Skin check. Lung cancer screening. You may have this screening every year starting at age 54 if you have a 30-pack-year history of smoking and currently smoke or have quit within the past 15 years. Fecal occult blood test (FOBT) of the stool. You may have this test every year starting at age 42. Flexible sigmoidoscopy or colonoscopy. You may have a sigmoidoscopy every 5 years or a colonoscopy every 10 years starting at age 68. Hepatitis C blood test. Hepatitis B blood test. Sexually transmitted disease (STD) testing. Diabetes screening. This is done by checking your blood sugar (glucose) after you have not eaten for a while (fasting). You may have this done every 1-3 years. Bone density scan. This is done to screen for osteoporosis. You may have this done starting at age 93. Mammogram. This may be done every 1-2 years. Talk to your health care provider about how often you should have regular  mammograms. Talk with your health care provider about your test results, treatment options, and if necessary, the need for more tests. Vaccines  Your health care provider may recommend certain vaccines, such as: Influenza vaccine. This is recommended every year. Tetanus, diphtheria, and acellular pertussis (Tdap, Td) vaccine. You may need a Td booster every 10 years. Zoster vaccine. You may need this  after age 30. Pneumococcal 13-valent conjugate (PCV13) vaccine. One dose is recommended after age 18. Pneumococcal polysaccharide (PPSV23) vaccine. One dose is recommended after age 57. Talk to your health care provider about which screenings and vaccines you need and how often you need them. This information is not intended to replace advice given to you by your health care provider. Make sure you discuss any questions you have with your health care provider. Document Released: 07/24/2015 Document Revised: 03/16/2016 Document Reviewed: 04/28/2015 Elsevier Interactive Patient Education  2017 [place] Prevention in the Home Falls can cause injuries. They can happen to people of all ages. There are many things you can do to make your home safe and to help prevent falls. What can I do on the outside of my home? Regularly fix the edges of walkways and driveways and fix any cracks. Remove anything that might make you trip as you walk through a door, such as a raised step or threshold. Trim any bushes or trees on the path to your home. Use bright outdoor lighting. Clear any walking paths of anything that might make someone trip, such as rocks or tools. Regularly check to see if handrails are loose or broken. Make sure that both sides of any steps have handrails. Any raised decks and porches should have guardrails on the edges. Have any leaves, snow, or ice cleared regularly. Use sand or salt on walking paths during winter. Clean up any spills in your garage right away. This includes oil or grease spills. What can I do in the bathroom? Use night lights. Install grab bars by the toilet and in the tub and shower. Do not use towel bars as grab bars. Use non-skid mats or decals in the tub or shower. If you need to sit down in the shower, use a plastic, non-slip stool. Keep the floor dry. Clean up any water that spills on the floor as soon as it happens. Remove soap buildup in the tub or  shower regularly. Attach bath mats securely with double-sided non-slip rug tape. Do not have throw rugs and other things on the floor that can make you trip. What can I do in the bedroom? Use night lights. Make sure that you have a light by your bed that is easy to reach. Do not use any sheets or blankets that are too big for your bed. They should not hang down onto the floor. Have a firm chair that has side arms. You can use this for support while you get dressed. Do not have throw rugs and other things on the floor that can make you trip. What can I do in the kitchen? Clean up any spills right away. Avoid walking on wet floors. Keep items that you use a lot in easy-to-reach places. If you need to reach something above you, use a strong step stool that has a grab bar. Keep electrical cords out of the way. Do not use floor polish or wax that makes floors slippery. If you must use wax, use non-skid floor wax. Do not have throw rugs and other things on the floor that can  make you trip. What can I do with my stairs? Do not leave any items on the stairs. Make sure that there are handrails on both sides of the stairs and use them. Fix handrails that are broken or loose. Make sure that handrails are as long as the stairways. Check any carpeting to make sure that it is firmly attached to the stairs. Fix any carpet that is loose or worn. Avoid having throw rugs at the top or bottom of the stairs. If you do have throw rugs, attach them to the floor with carpet tape. Make sure that you have a light switch at the top of the stairs and the bottom of the stairs. If you do not have them, ask someone to add them for you. What else can I do to help prevent falls? Wear shoes that: Do not have high heels. Have rubber bottoms. Are comfortable and fit you well. Are closed at the toe. Do not wear sandals. If you use a stepladder: Make sure that it is fully opened. Do not climb a closed stepladder. Make  sure that both sides of the stepladder are locked into place. Ask someone to hold it for you, if possible. Clearly mark and make sure that you can see: Any grab bars or handrails. First and last steps. Where the edge of each step is. Use tools that help you move around (mobility aids) if they are needed. These include: Canes. Walkers. Scooters. Crutches. Turn on the lights when you go into a dark area. Replace any light bulbs as soon as they burn out. Set up your furniture so you have a clear path. Avoid moving your furniture around. If any of your floors are uneven, fix them. If there are any pets around you, be aware of where they are. Review your medicines with your doctor. Some medicines can make you feel dizzy. This can increase your chance of falling. Ask your doctor what other things that you can do to help prevent falls. This information is not intended to replace advice given to you by your health care provider. Make sure you discuss any questions you have with your health care provider. Document Released: 04/23/2009 Document Revised: 12/03/2015 Document Reviewed: 08/01/2014 Elsevier Interactive Patient Education  2017 [place].  Opioid Pain Medicine Management Opioids are powerful medicines that are used to treat moderate to severe pain. When used for short periods of time, they can help you to: Sleep better. Do better in physical or occupational therapy. Feel better in the first few days after an injury. Recover from surgery. Opioids should be taken with the supervision of a trained health care provider. They should be taken for the shortest period of time possible. This is because opioids can be addictive, and the longer you take opioids, the greater your risk of addiction. This addiction can also be called opioid use disorder. What are the risks? Using opioid pain medicines for longer than 3 days increases your risk of side effects. Side effects  include: Constipation. Nausea and vomiting. Breathing difficulties (respiratory depression). Drowsiness. Confusion. Opioid use disorder. Itching. Taking opioid pain medicine for a long period of time can affect your ability to do daily tasks. It also puts you at risk for: Motor vehicle crashes. Depression. Suicide. Heart attack. Overdose, which can be life-threatening. What is a pain treatment plan? A pain treatment plan is an agreement between you and your health care provider. Pain is unique to each person, and treatments vary depending on your condition. To  manage your pain, you and your health care provider need to work together. To help you do this: Discuss the goals of your treatment, including how much pain you might expect to have and how you will manage the pain. Review the risks and benefits of taking opioid medicines. Remember that a good treatment plan uses more than one approach and minimizes the chance of side effects. Be honest about the amount of medicines you take and about any drug or alcohol use. Get pain medicine prescriptions from only one health care provider. Pain can be managed with many types of alternative treatments. Ask your health care provider to refer you to one or more specialists who can help you manage pain through: Physical or occupational therapy. Counseling (cognitive behavioral therapy). Good nutrition. Biofeedback. Massage. Meditation. Non-opioid medicine. Following a gentle exercise program. How to use opioid pain medicine Taking medicine Take your pain medicine exactly as told by your health care provider. Take it only when you need it. If your pain gets less severe, you may take less than your prescribed dose if your health care provider approves. If you are not having pain, do nottake pain medicine unless your health care provider tells you to take it. If your pain is severe, do nottry to treat it yourself by taking more pills than  instructed on your prescription. Contact your health care provider for help. Write down the times when you take your pain medicine. It is easy to become confused while on pain medicine. Writing the time can help you avoid overdose. Take other over-the-counter or prescription medicines only as told by your health care provider. Keeping yourself and others safe  While you are taking opioid pain medicine: Do not drive, use machinery, or power tools. Do not sign legal documents. Do not drink alcohol. Do not take sleeping pills. Do not supervise children by yourself. Do not do activities that require climbing or being in high places. Do not go to a lake, river, ocean, spa, or swimming pool. Do not share your pain medicine with anyone. Keep pain medicine in a locked cabinet or in a secure area where pets and children cannot reach it. Stopping your use of opioids If you have been taking opioid medicine for more than a few weeks, you may need to slowly decrease (taper) how much you take until you stop completely. Tapering your use of opioids can decrease your risk of symptoms of withdrawal, such as: Pain and cramping in the abdomen. Nausea. Sweating. Sleepiness. Restlessness. Uncontrollable shaking (tremors). Cravings for the medicine. Do not attempt to taper your use of opioids on your own. Talk with your health care provider about how to do this. Your health care provider may prescribe a step-down schedule based on how much medicine you are taking and how long you have been taking it. Getting rid of leftover pills Do not save any leftover pills. Get rid of leftover pills safely by: Taking the medicine to a prescription take-back program. This is usually offered by the county or law enforcement. Bringing them to a pharmacy that has a drug disposal container. Flushing them down the toilet. Check the label or package insert of your medicine to see whether this is safe to do. Throwing them out in  the trash. Check the label or package insert of your medicine to see whether this is safe to do. If it is safe to throw it out, remove the medicine from the original container, put it into a sealable bag or  container, and mix it with used coffee grounds, food scraps, dirt, or cat litter before putting it in the trash. Follow these instructions at home: Activity Do exercises as told by your health care provider. Avoid activities that make your pain worse. Return to your normal activities as told by your health care provider. Ask your health care provider what activities are safe for you. General instructions You may need to take these actions to prevent or treat constipation: Drink enough fluid to keep your urine pale yellow. Take over-the-counter or prescription medicines. Eat foods that are high in fiber, such as beans, whole grains, and fresh fruits and vegetables. Limit foods that are high in fat and processed sugars, such as fried or sweet foods. Keep all follow-up visits. This is important. Where to find support If you have been taking opioids for a long time, you may benefit from receiving support for quitting from a local support group or counselor. Ask your health care provider for a referral to these resources in your area. Where to find more information Centers for Disease Control and Prevention (CDC): http://www.wolf.info/ [place] Food and Drug Administration (FDA): [place] Get help right away if: You may have taken too much of an opioid (overdosed). Common symptoms of an overdose: Your breathing is slower or more shallow than normal. You have a very slow heartbeat (pulse). You have slurred speech. You have nausea and vomiting. Your pupils become very small. You have other potential symptoms: You are very confused. You faint or feel like you will faint. You have cold, clammy skin. You have blue lips or fingernails. You have thoughts of harming yourself or harming others. These  symptoms may represent a serious problem that is an emergency. Do not wait to see if the symptoms will go away. Get medical help right away. Call your local emergency services (911 in the [place] Do not drive yourself to the hospital.  If you ever feel like you may hurt yourself or others, or have thoughts about taking your own life, get help right away. Go to your nearest emergency department or: Call your local emergency services (911 in the [place] Call the [facility] 4402131293 in the [place] Call a suicide crisis helpline, such as the [place] at 916-443-8559 or 988 in the [place]. This is open 24 hours a day in the [place] Text the Crisis Text Line at 951-398-9970 (in the [place].). Summary Opioid medicines can help you manage moderate to severe pain for a short period of time. A pain treatment plan is an agreement between you and your health care provider. Discuss the goals of your treatment, including how much pain you might expect to have and how you will manage the pain. If you think that you or someone else may have taken too much of an opioid, get medical help right away. This information is not intended to replace advice given to you by your health care provider. Make sure you discuss any questions you have with your health care provider. Document Revised: 01/20/2021 Document Reviewed: 10/07/2020 Elsevier Patient Education  [place].

## 2021-08-17 NOTE — Progress Notes (Signed)
Subjective:   Kaitlyn Huff is a 81 y.o. female who presents for Medicare Annual (Subsequent) preventive examination.  Review of Systems    No ROS.  Medicare Wellness Virtual Visit.  Visual/audio telehealth visit, UTA vital signs.   See social history for additional risk factors.   Cardiac Risk Factors include: advanced age (>35men, >53 women);hypertension     Objective:    Today's Vitals   08/17/21 1039  Weight: 121 lb (54.9 kg)  Height: 5\' 5"  (1.651 m)   Body mass index is 20.14 kg/m.  Advanced Directives 08/17/2021 06/21/2021 11/22/2020 08/14/2020 08/08/2019 04/03/2019 04/03/2019  Does Patient Have a Medical Advance Directive? Yes No No Yes Yes No No  Type of Paramedic of [place];Living will - - [place];Living will [place];Living will - -  Does patient want to make changes to medical advance directive? No - Patient declined - - No - Patient declined No - Patient declined - -  Copy of [place] in Chart? Yes - validated most recent copy scanned in chart (See row information) - - Yes - validated most recent copy scanned in chart (See row information) Yes - validated most recent copy scanned in chart (See row information) - -  Would patient like information on creating a medical advance directive? - - No - Patient declined - - Yes (Inpatient - patient requests chaplain consult to create a medical advance directive) -    Current Medications (verified) Outpatient Encounter Medications as of 08/17/2021  Medication Sig   albuterol (VENTOLIN HFA) 108 (90 Base) MCG/ACT inhaler USE 2 INHALATIONS EVERY 6 HOURS AS NEEDED FOR WHEEZING OR SHORTNESS OF BREATH   alendronate (FOSAMAX) 70 MG tablet Take 70 mg by mouth every Monday.    ALPRAZolam (XANAX) 0.25 MG tablet Take 1 tablet (0.25 mg total) by mouth 2 (two) times daily as needed for anxiety.   apixaban (ELIQUIS) 5 MG TABS tablet Hold until followup with Dr.  Ubaldo Glassing due to your hemorrhoid bleeding and hemoglobin drop.   atorvastatin (LIPITOR) 20 MG tablet Take 20 mg by mouth daily.   Calcium Carbonate-Vitamin D 600-400 MG-UNIT tablet Take 1 tablet by mouth 2 (two) times daily.    cetirizine (ZYRTEC) 10 MG tablet TAKE 1 TABLET DAILY   cholecalciferol (VITAMIN D) 1000 units tablet Take 1,000 Units by mouth daily.   colestipol (COLESTID) 1 g tablet Take 2 g by mouth daily.    diltiazem (CARDIZEM CD) 180 MG 24 hr capsule Take 180 mg by mouth daily.   fluticasone (FLONASE) 50 MCG/ACT nasal spray USE 2 SPRAYS IN EACH NOSTRIL DAILY (CALL OFFICE SOON TO RESCHEDULE YOUR PHYSICAL)   furosemide (LASIX) 40 MG tablet Take 1 tablet by mouth daily.   gabapentin (NEURONTIN) 100 MG capsule Take 100 mg by mouth daily.   lip balm (BLISTEX) OINT Apply 1 application topically as needed for lip care.   Multiple Vitamin (MULTIVITAMIN WITH MINERALS) TABS tablet Take 1 tablet by mouth daily.   omeprazole (PRILOSEC) 20 MG capsule Take 1 capsule by mouth daily.   potassium chloride (MICRO-K) 10 MEQ CR capsule Hold while not taking lasix.   predniSONE (DELTASONE) 5 MG tablet Take 5 mg by mouth daily.   Probiotic Product (ALIGN PO) Take 1 tablet by mouth daily.   spironolactone (ALDACTONE) 25 MG tablet Take 12.5 mg by mouth daily.   tiZANidine (ZANAFLEX) 4 MG tablet Take by mouth.   traMADol (ULTRAM) 50 MG tablet Take  50 mg by mouth 2 (two) times daily as needed.   No facility-administered encounter medications on file as of 08/17/2021.    Allergies (verified) Astelin [azelastine hcl], Codeine, Dilaudid [hydromorphone], Flexeril [cyclobenzaprine], Imuran [azathioprine], Lisinopril, Lyrica [pregabalin], Methotrexate derivatives, Amiodarone, Amoxicillin, Arava [leflunomide], Clindamycin/lincomycin, Doxycycline, Lodine [etodolac], Percocet [oxycodone-acetaminophen], and Sulfa antibiotics   History: Past Medical History:  Diagnosis Date   Anemia    Cancer ([place])     Diverticulitis    GERD (gastroesophageal reflux disease)    Hyperlipidemia    Hypertension    Osteoarthritis    Osteoporosis    actonel   Positive PPD    s/p INH (2006)   Rheumatoid arthritis(714.0)    MTX transaminitis, Leflunomide (rash), enbrel, plaquinil, prednisone, remicade, Imuran (transaminitis)   Valvular heart disease    moderate MR and TR   Past Surgical History:  Procedure Laterality Date   ABDOMINAL HYSTERECTOMY     CERVICAL CONE BIOPSY     CHOLECYSTECTOMY  06/22/14   COLONOSCOPY WITH PROPOFOL N/A 07/09/2015   Procedure: COLONOSCOPY WITH PROPOFOL;  Surgeon: Manya Silvas, MD;  Location: [facility]COPY;  Service: Endoscopy;  Laterality: N/A;   ESOPHAGOGASTRODUODENOSCOPY (EGD) WITH PROPOFOL N/A 07/09/2015   Procedure: ESOPHAGOGASTRODUODENOSCOPY (EGD) WITH PROPOFOL;  Surgeon: Manya Silvas, MD;  [facility];  Service: Endoscopy;  Laterality: N/A;   OVARY SURGERY     TRACHEOSTOMY  1959   TUBAL LIGATION     Family History  Problem Relation Age of Onset   Heart disease Father        MI   Heart disease Mother    Valvular heart disease Mother    Breast cancer Sister 84   Cancer Sister        Lung cancer   COPD Sister    Heart disease Sister    Colon cancer Neg Hx    Social History   Socioeconomic History   Marital status: Widowed    Spouse name: Not on file   Number of children: 4   Years of education: Not on file   Highest education level: Not on file  Occupational History   Occupation: retired  Tobacco Use   Smoking status: Never   Smokeless tobacco: Never  Substance and Sexual Activity   Alcohol use: No    Alcohol/week: 0.0 standard drinks   Drug use: No   Sexual activity: Never  Other Topics Concern   Not on file  Social History Narrative   Lives with family at home   Social Determinants of Health   Financial Resource Strain: Low Risk    Difficulty of Paying Living Expenses: Not hard at all  Food Insecurity: No Food  Insecurity   Worried About Charity fundraiser in the Last Year: Never true   [place] in the Last Year: Never true  Transportation Needs: No Transportation Needs   Lack of Transportation (Medical): No   Lack of Transportation (Non-Medical): No  Physical Activity: Not on file  Stress: No Stress Concern Present   Feeling of Stress : Not at all  Social Connections: Unknown   Frequency of Communication with Friends and Family: More than three times a week   Frequency of Social Gatherings with Friends and Family: More than three times a week   Attends Religious Services: Not on file   Active Member of Clubs or Organizations: Yes   Attends Archivist Meetings: Not on file   Marital Status: Not on file  Tobacco Counseling Counseling given: Not Answered   Clinical Intake:  Pre-visit preparation completed: Yes        Diabetes: No  How often do you need to have someone help you when you read instructions, pamphlets, or other written materials from your doctor or pharmacy?: 1 - Never  Interpreter Needed?: No      Activities of Daily Living In your present state of health, do you have any difficulty performing the following activities: 08/17/2021 11/22/2020  Hearing? N N  Vision? N N  Difficulty concentrating or making decisions? N N  Walking or climbing stairs? N Y  Comment Paces self. -  Dressing or bathing? N N  Doing errands, shopping? N N  Preparing Food and eating ? N -  Using the Toilet? N -  In the past six months, have you accidently leaked urine? N -  Do you have problems with loss of bowel control? N -  Managing your Medications? N -  Managing your Finances? N -  Housekeeping or managing your Housekeeping? Y -  Comment Daughter assist as needed -  Some recent data might be hidden    Patient Care Team: [name], MD as PCP - General (Internal Medicine)  Indicate any recent Medical Services you may have received from other than [facility]  providers in the past year (date may be approximate).     Assessment:   This is a routine wellness examination for Elvy.  Virtual Visit via Telephone Note  I connected with  [place] on 08/17/21 at 10:30 AM EST by telephone and verified that I am speaking with the correct person using two identifiers.  Persons participating in the virtual visit: patient/Nurse Health Advisor   I discussed the limitations, risks, security and privacy concerns of performing an evaluation and management service by telephone and the availability of in person appointments. The patient expressed understanding and agreed to proceed.  Interactive audio and video telecommunications were attempted between this nurse and patient, however failed, due to patient having technical difficulties OR patient did not have access to video capability.  We continued and completed visit with audio only.  Some vital signs may be absent or patient reported.   Hearing/Vision screen Hearing Screening - Comments:: Patient is able to hear conversational tones without difficulty. No issues reported. Vision Screening - Comments:: Followed by Dr. Gloriann Loan  Wears corrective lenses  Annual visits  Cataract extraction, bilateral They have regular follow up with the ophthalmologist  Dietary issues and exercise activities discussed: Current Exercise Habits: Home exercise routine, Type of exercise: stretching, Intensity: Mild Healthy diet Good water intake   Goals Addressed               This Visit's Progress     Patient Stated     Healthy Lifestyle (pt-stated)        Stay active; stretch        Depression Screen PHQ 2/9 Scores 08/17/2021 07/06/2021 08/14/2020 10/24/2019 08/08/2019 08/03/2018 12/21/2017  PHQ - 2 Score 0 0 0 0 0 0 0  PHQ- 9 Score - - - - - - -    Fall Risk Fall Risk  08/17/2021 07/06/2021 08/14/2020 10/24/2019 08/08/2019  Falls in the past year? 0 0 0 0 0  Number falls in past yr: 0 0 0 0 -  Injury with Fall?  - 0 0 - -  Risk for fall due to : - No Fall Risks - - -  Risk for fall due to:  Comment - - - - -  Follow up Falls evaluation completed Falls evaluation completed Falls evaluation completed Falls evaluation completed Falls evaluation completed   [place]: Home free of loose throw rugs in walkways, pet beds, electrical cords, etc? Yes  Adequate lighting in your home to reduce risk of falls? Yes   ASSISTIVE DEVICES UTILIZED TO PREVENT FALLS: Life alert? No  Use of a cane, walker or w/c? No  Grab bars in the bathroom? No  Shower chair or bench in shower? No  Elevated toilet seat or a handicapped toilet? No   TIMED UP AND GO: Was the test performed? No .   Cognitive Function: Patient is alert and oriented x3.  MMSE - Mini Mental State Exam 05/12/2016  Orientation to time 5  Orientation to Place 5  Registration 3  Attention/ Calculation 5  Recall 3  Language- name 2 objects 2  Language- repeat 1  Language- follow 3 step command 3  Language- read & follow direction 1  Write a sentence 1  Copy design 1  Total score 30     6CIT Screen 08/14/2020 08/08/2019 08/03/2018 07/14/2017  What Year? 0 points 0 points 0 points 0 points  What month? 0 points 0 points 0 points 0 points  What time? 0 points 0 points 0 points 0 points  Count back from 20 0 points 0 points 0 points 0 points  Months in reverse 0 points 0 points 0 points 0 points  Repeat phrase - 0 points 0 points 0 points  Total Score - 0 0 0   Immunizations Immunization History  Administered Date(s) Administered   Fluad Quad(high Dose 65+) 04/05/2019   Influenza Split 05/17/2012, 03/03/2014   Influenza, High Dose Seasonal PF 02/25/2017, 03/29/2018   Influenza,inj,Quad PF,6+ Mos 03/25/2013, 08/05/2015   Influenza-Unspecified 03/25/2013   Pneumococcal Conjugate-13 05/21/2018   Pneumococcal Polysaccharide-23 02/24/2017   TDAP status: Due, Education has been provided regarding the importance of  this vaccine. Advised may receive this vaccine at local pharmacy or Health Dept. Aware to provide a copy of the vaccination record if obtained from local pharmacy or Health Dept. Verbalized acceptance and understanding.   Shingrix Completed?: No.    Education has been provided regarding the importance of this vaccine. Patient has been advised to call insurance company to determine out of pocket expense if they have not yet received this vaccine. Advised may also receive vaccine at local pharmacy or Health Dept. Verbalized acceptance and understanding.  Screening Tests Health Maintenance  Topic Date Due   Zoster Vaccines- Shingrix (1 of 2) 10/04/2021 (Originally 04/28/1960)   INFLUENZA VACCINE  10/08/2021 (Originally 02/08/2021)   TETANUS/TDAP  08/17/2022 (Originally 01/20/2019)   Pneumonia Vaccine 43+ Years old  Completed   DEXA SCAN  Completed   Hepatitis C Screening  Completed   HPV VACCINES  Aged Out   COLONOSCOPY (Pts 45-78yrs Insurance coverage will need to be confirmed)  Discontinued   COVID-19 Vaccine  Discontinued   Health Maintenance There are no preventive care reminders to display for this patient.  Mammogram- plans to schedule  Lung Cancer Screening: completed 11/23/20  Vision Screening: Recommended annual ophthalmology exams for early detection of glaucoma and other disorders of the eye.  Dental Screening: Recommended annual dental exams for proper oral hygiene  Community Resource Referral / Chronic Care Management: CRR required this visit?  No   CCM required this visit?  No      Plan:   Keep  all routine maintenance appointments.   I have personally reviewed and noted the following in the patients chart:   Medical and social history Use of alcohol, tobacco or illicit drugs  Current medications and supplements including opioid prescriptions. Taking Ultram. Followed by Dr. [name].  Functional ability and status Nutritional status Physical activity Advanced  directives List of other physicians Hospitalizations, surgeries, and ER visits in previous 12 months Vitals Screenings to include cognitive, depression, and falls Referrals and appointments  In addition, I have reviewed and discussed with patient certain preventive protocols, quality metrics, and best practice recommendations. A written personalized care plan for preventive services as well as general preventive health recommendations were provided to patient.     Varney Biles, LPN   7/0/2637

## 2021-08-26 ENCOUNTER — Other Ambulatory Visit: Payer: Self-pay

## 2021-08-26 ENCOUNTER — Telehealth: Payer: Self-pay | Admitting: Internal Medicine

## 2021-08-26 MED ORDER — CETIRIZINE HCL 10 MG PO TABS: 10.0000 mg | ORAL_TABLET | Freq: Every day | ORAL | 3 refills | Status: DC

## 2021-08-26 NOTE — Telephone Encounter (Signed)
Medication refilled

## 2021-08-26 NOTE — Telephone Encounter (Signed)
Pt need refill on cetirizine sent to [facility] in [place]

## 2021-09-07 DIAGNOSIS — I480 Paroxysmal atrial fibrillation: Secondary | ICD-10-CM | POA: Diagnosis not present

## 2021-09-07 DIAGNOSIS — I739 Peripheral vascular disease, unspecified: Secondary | ICD-10-CM | POA: Diagnosis not present

## 2021-09-07 DIAGNOSIS — I059 Rheumatic mitral valve disease, unspecified: Secondary | ICD-10-CM | POA: Diagnosis not present

## 2021-09-07 DIAGNOSIS — I10 Essential (primary) hypertension: Secondary | ICD-10-CM | POA: Diagnosis not present

## 2021-09-07 DIAGNOSIS — J441 Chronic obstructive pulmonary disease with (acute) exacerbation: Secondary | ICD-10-CM | POA: Diagnosis not present

## 2021-09-07 DIAGNOSIS — R55 Syncope and collapse: Secondary | ICD-10-CM | POA: Diagnosis not present

## 2021-09-07 DIAGNOSIS — G4733 Obstructive sleep apnea (adult) (pediatric): Secondary | ICD-10-CM | POA: Diagnosis not present

## 2021-09-07 DIAGNOSIS — I38 Endocarditis, valve unspecified: Secondary | ICD-10-CM | POA: Diagnosis not present

## 2021-09-14 DIAGNOSIS — M0579 Rheumatoid arthritis with rheumatoid factor of multiple sites without organ or systems involvement: Secondary | ICD-10-CM | POA: Diagnosis not present

## 2021-11-09 DIAGNOSIS — M0579 Rheumatoid arthritis with rheumatoid factor of multiple sites without organ or systems involvement: Secondary | ICD-10-CM | POA: Diagnosis not present

## 2022-01-04 DIAGNOSIS — M0579 Rheumatoid arthritis with rheumatoid factor of multiple sites without organ or systems involvement: Secondary | ICD-10-CM | POA: Diagnosis not present

## 2022-01-06 DIAGNOSIS — G4733 Obstructive sleep apnea (adult) (pediatric): Secondary | ICD-10-CM | POA: Diagnosis not present

## 2022-01-06 DIAGNOSIS — I38 Endocarditis, valve unspecified: Secondary | ICD-10-CM | POA: Diagnosis not present

## 2022-01-06 DIAGNOSIS — R55 Syncope and collapse: Secondary | ICD-10-CM | POA: Diagnosis not present

## 2022-01-06 DIAGNOSIS — I480 Paroxysmal atrial fibrillation: Secondary | ICD-10-CM | POA: Diagnosis not present

## 2022-01-06 DIAGNOSIS — J441 Chronic obstructive pulmonary disease with (acute) exacerbation: Secondary | ICD-10-CM | POA: Diagnosis not present

## 2022-01-06 DIAGNOSIS — I059 Rheumatic mitral valve disease, unspecified: Secondary | ICD-10-CM | POA: Diagnosis not present

## 2022-01-06 DIAGNOSIS — E782 Mixed hyperlipidemia: Secondary | ICD-10-CM | POA: Diagnosis not present

## 2022-01-06 DIAGNOSIS — I10 Essential (primary) hypertension: Secondary | ICD-10-CM | POA: Diagnosis not present

## 2022-03-02 DIAGNOSIS — Z09 Encounter for follow-up examination after completed treatment for conditions other than malignant neoplasm: Secondary | ICD-10-CM | POA: Diagnosis not present

## 2022-03-02 DIAGNOSIS — M0579 Rheumatoid arthritis with rheumatoid factor of multiple sites without organ or systems involvement: Secondary | ICD-10-CM | POA: Diagnosis not present

## 2022-03-02 DIAGNOSIS — Z8739 Personal history of other diseases of the musculoskeletal system and connective tissue: Secondary | ICD-10-CM | POA: Diagnosis not present

## 2022-03-02 DIAGNOSIS — M25519 Pain in unspecified shoulder: Secondary | ICD-10-CM | POA: Diagnosis not present

## 2022-03-02 DIAGNOSIS — Z13820 Encounter for screening for osteoporosis: Secondary | ICD-10-CM | POA: Diagnosis not present

## 2022-03-02 DIAGNOSIS — Z79899 Other long term (current) drug therapy: Secondary | ICD-10-CM | POA: Diagnosis not present

## 2022-03-02 DIAGNOSIS — M1811 Unilateral primary osteoarthritis of first carpometacarpal joint, right hand: Secondary | ICD-10-CM | POA: Diagnosis not present

## 2022-03-02 DIAGNOSIS — M06011 Rheumatoid arthritis without rheumatoid factor, right shoulder: Secondary | ICD-10-CM | POA: Diagnosis not present

## 2022-03-02 DIAGNOSIS — M1812 Unilateral primary osteoarthritis of first carpometacarpal joint, left hand: Secondary | ICD-10-CM | POA: Diagnosis not present

## 2022-03-11 ENCOUNTER — Ambulatory Visit: Payer: Medicare Other | Admitting: Internal Medicine

## 2022-03-28 ENCOUNTER — Encounter: Payer: Self-pay | Admitting: Internal Medicine

## 2022-03-28 ENCOUNTER — Ambulatory Visit (INDEPENDENT_AMBULATORY_CARE_PROVIDER_SITE_OTHER): Payer: Medicare Other | Admitting: Internal Medicine

## 2022-03-28 VITALS — BP 120/64 | HR 70 | Temp 98.6°F | Ht 65.0 in | Wt 123.4 lb

## 2022-03-28 DIAGNOSIS — D649 Anemia, unspecified: Secondary | ICD-10-CM | POA: Diagnosis not present

## 2022-03-28 DIAGNOSIS — K760 Fatty (change of) liver, not elsewhere classified: Secondary | ICD-10-CM

## 2022-03-28 DIAGNOSIS — D72829 Elevated white blood cell count, unspecified: Secondary | ICD-10-CM | POA: Diagnosis not present

## 2022-03-28 DIAGNOSIS — E7800 Pure hypercholesterolemia, unspecified: Secondary | ICD-10-CM | POA: Diagnosis not present

## 2022-03-28 DIAGNOSIS — M069 Rheumatoid arthritis, unspecified: Secondary | ICD-10-CM | POA: Diagnosis not present

## 2022-03-28 DIAGNOSIS — Z86010 Personal history of colonic polyps: Secondary | ICD-10-CM | POA: Diagnosis not present

## 2022-03-28 DIAGNOSIS — I4891 Unspecified atrial fibrillation: Secondary | ICD-10-CM

## 2022-03-28 DIAGNOSIS — I10 Essential (primary) hypertension: Secondary | ICD-10-CM

## 2022-03-28 DIAGNOSIS — K219 Gastro-esophageal reflux disease without esophagitis: Secondary | ICD-10-CM

## 2022-03-28 DIAGNOSIS — R739 Hyperglycemia, unspecified: Secondary | ICD-10-CM

## 2022-03-28 DIAGNOSIS — N1831 Chronic kidney disease, stage 3a: Secondary | ICD-10-CM

## 2022-03-28 DIAGNOSIS — N183 Chronic kidney disease, stage 3 unspecified: Secondary | ICD-10-CM | POA: Insufficient documentation

## 2022-03-28 NOTE — Progress Notes (Signed)
Patient ID: Kaitlyn Huff, female   DOB: [birth year], 81 y.o.   MRN: [id number]   Subjective:    Patient ID: Kaitlyn Huff, female    DOB: March 24, 1941, 81 y.o.   MRN: [id number]   Patient here for  Chief Complaint  Patient presents with   Follow-up    Follow up to discuss appointment with rheumatologist    .   HPI Recent appt with rheumatology 03/02/22 - f/u RA.  Receives remicade.  Has intermittent flares - RA. Rheumatology discussed the possibility of increasing remicade dose.  Saw cardiology 01/06/22 - paroxysmal afib.  Recommended continuing diltiazem 180mg  bid.  Recommended f/u echo.  Also recommended low salt diet and diuretics.  Reports joints are ok - except during flares.  No chest pain reported.  Breathing overall stable.     Past Medical History:  Diagnosis Date   Anemia    Cancer ([place])    Diverticulitis    GERD (gastroesophageal reflux disease)    Hyperlipidemia    Hypertension    Osteoarthritis    Osteoporosis    actonel   Positive PPD    s/p INH (2006)   Rheumatoid arthritis(714.0)    MTX transaminitis, Leflunomide (rash), enbrel, plaquinil, prednisone, remicade, Imuran (transaminitis)   Valvular heart disease    moderate MR and TR   Past Surgical History:  Procedure Laterality Date   ABDOMINAL HYSTERECTOMY     CERVICAL CONE BIOPSY     CHOLECYSTECTOMY  06/22/14   COLONOSCOPY WITH PROPOFOL N/A 07/09/2015   Procedure: COLONOSCOPY WITH PROPOFOL;  Surgeon: Manya Silvas, MD;  Location: [place];  Service: Endoscopy;  Laterality: N/A;   ESOPHAGOGASTRODUODENOSCOPY (EGD) WITH PROPOFOL N/A 07/09/2015   Procedure: ESOPHAGOGASTRODUODENOSCOPY (EGD) WITH PROPOFOL;  Surgeon: Manya Silvas, MD;  Location: [facility] ENDOSCOPY;  Service: Endoscopy;  Laterality: N/A;   OVARY SURGERY     TRACHEOSTOMY  1959   TUBAL LIGATION     Family History  Problem Relation Age of Onset   Heart disease Father        MI   Heart disease Mother    Valvular heart disease Mother     Breast cancer Sister 59   Cancer Sister        Lung cancer   COPD Sister    Heart disease Sister    Colon cancer Neg Hx    Social History   Socioeconomic History   Marital status: Widowed    Spouse name: Not on file   Number of children: 4   Years of education: Not on file   Highest education level: Not on file  Occupational History   Occupation: retired  Tobacco Use   Smoking status: Never   Smokeless tobacco: Never  Substance and Sexual Activity   Alcohol use: No    Alcohol/week: 0.0 standard drinks of alcohol   Drug use: No   Sexual activity: Never  Other Topics Concern   Not on file  Social History Narrative   Lives with family at home   Social Determinants of Health   Financial Resource Strain: Low Risk  (08/17/2021)   Overall Financial Resource Strain (CARDIA)    Difficulty of Paying Living Expenses: Not hard at all  Food Insecurity: No [place] (08/17/2021)   Hunger Vital Sign    Worried About Running Out of Food in the Last Year: Never true    [place] in the Last Year: Never true  Transportation Needs: No Transportation Needs (08/17/2021)  PRAPARE - Hydrologist (Medical): No    Lack of Transportation (Non-Medical): No  Physical Activity: Not on file  Stress: No Stress Concern Present (08/17/2021)   [place]    Feeling of Stress : Not at all  Social Connections: Unknown (08/17/2021)   Social Connection and Isolation Panel [NHANES]    Frequency of Communication with Friends and Family: More than three times a week    Frequency of Social Gatherings with Friends and Family: More than three times a week    Attends Religious Services: Not on Advertising copywriter or Organizations: Yes    Attends Archivist Meetings: Not on file    Marital Status: Not on file     Review of Systems  Constitutional:  Negative for appetite change and  unexpected weight change.  HENT:  Negative for congestion and sinus pressure.   Respiratory:  Negative for cough, chest tightness and shortness of breath.   Cardiovascular:  Negative for chest pain and palpitations.       No increased swelling.   Gastrointestinal:  Negative for abdominal pain, diarrhea, nausea and vomiting.  Genitourinary:  Negative for difficulty urinating and dysuria.  Musculoskeletal:  Negative for myalgias.       Intermittent joint flares as outlined.   Skin:  Negative for color change and rash.  Neurological:  Negative for dizziness, light-headedness and headaches.  Psychiatric/Behavioral:  Negative for agitation and dysphoric mood.        Objective:     BP 120/64 (BP Location: Left Arm, Patient Position: Sitting, Cuff Size: Normal)   Pulse 70   Temp 98.6 F (37 C) (Oral)   Ht $R'5\' 5"'nf$  (1.651 m)   Wt 123 lb 6.4 oz (56 kg)   SpO2 96%   BMI 20.53 kg/m  Wt Readings from Last 3 Encounters:  03/28/22 123 lb 6.4 oz (56 kg)  08/17/21 121 lb (54.9 kg)  07/06/21 121 lb 12.8 oz (55.2 kg)    Physical Exam Vitals reviewed.  Constitutional:      General: She is not in acute distress.    Appearance: Normal appearance.  HENT:     Head: Normocephalic and atraumatic.     Right Ear: External ear normal.     Left Ear: External ear normal.  Eyes:     General: No scleral icterus.       Right eye: No discharge.        Left eye: No discharge.     Conjunctiva/sclera: Conjunctivae normal.  Neck:     Thyroid: No thyromegaly.  Cardiovascular:     Rate and Rhythm: Normal rate and regular rhythm.  Pulmonary:     Effort: No respiratory distress.     Breath sounds: Normal breath sounds. No wheezing.  Abdominal:     General: Bowel sounds are normal.     Palpations: Abdomen is soft.     Tenderness: There is no abdominal tenderness.  Musculoskeletal:        General: No swelling or tenderness.     Cervical back: Neck supple. No tenderness.  Lymphadenopathy:     Cervical:  No cervical adenopathy.  Skin:    Findings: No erythema or rash.  Neurological:     Mental Status: She is alert.  Psychiatric:        Mood and Affect: Mood normal.        Behavior: Behavior normal.  Outpatient Encounter Medications as of 03/28/2022  Medication Sig   albuterol (VENTOLIN HFA) 108 (90 Base) MCG/ACT inhaler USE 2 INHALATIONS EVERY 6 HOURS AS NEEDED FOR WHEEZING OR SHORTNESS OF BREATH   alendronate (FOSAMAX) 70 MG tablet Take 70 mg by mouth every Monday.    ALPRAZolam (XANAX) 0.25 MG tablet Take 1 tablet (0.25 mg total) by mouth 2 (two) times daily as needed for anxiety.   apixaban (ELIQUIS) 5 MG TABS tablet Hold until followup with Dr. Ubaldo Glassing due to your hemorrhoid bleeding and hemoglobin drop.   Calcium Carbonate-Vitamin D 600-400 MG-UNIT tablet Take 1 tablet by mouth 2 (two) times daily.    cetirizine (ZYRTEC) 10 MG tablet Take 1 tablet (10 mg total) by mouth daily.   cholecalciferol (VITAMIN D) 1000 units tablet Take 1,000 Units by mouth daily.   colestipol (COLESTID) 1 g tablet Take 2 g by mouth daily.    diltiazem (CARDIZEM CD) 180 MG 24 hr capsule Take 180 mg by mouth daily.   fluticasone (FLONASE) 50 MCG/ACT nasal spray USE 2 SPRAYS IN EACH NOSTRIL DAILY (CALL OFFICE SOON TO RESCHEDULE YOUR PHYSICAL)   furosemide (LASIX) 40 MG tablet Take 1 tablet by mouth daily.   gabapentin (NEURONTIN) 100 MG capsule Take 100 mg by mouth daily.   lip balm (BLISTEX) OINT Apply 1 application topically as needed for lip care.   Multiple Vitamin (MULTIVITAMIN WITH MINERALS) TABS tablet Take 1 tablet by mouth daily.   omeprazole (PRILOSEC) 20 MG capsule Take 1 capsule by mouth daily.   potassium chloride (MICRO-K) 10 MEQ CR capsule Hold while not taking lasix.   predniSONE (DELTASONE) 5 MG tablet Take 5 mg by mouth daily.   Probiotic Product (ALIGN PO) Take 1 tablet by mouth daily.   spironolactone (ALDACTONE) 25 MG tablet Take 12.5 mg by mouth daily.   tiZANidine (ZANAFLEX) 4 MG  tablet Take by mouth.   traMADol (ULTRAM) 50 MG tablet Take 50 mg by mouth 2 (two) times daily as needed.   [DISCONTINUED] atorvastatin (LIPITOR) 20 MG tablet Take 20 mg by mouth daily.   No facility-administered encounter medications on file as of 03/28/2022.     Lab Results  Component Value Date   WBC 7.1 03/28/2022   HGB 12.3 03/28/2022   HCT 35.4 (L) 03/28/2022   PLT 211.0 03/28/2022   GLUCOSE 104 (H) 03/28/2022   CHOL 207 (H) 03/28/2022   TRIG 81.0 03/28/2022   HDL 79.90 03/28/2022   LDLDIRECT 73.7 08/20/2013   LDLCALC 111 (H) 03/28/2022   ALT 15 03/28/2022   AST 29 03/28/2022   NA 132 (L) 03/28/2022   K 4.1 03/28/2022   CL 94 (L) 03/28/2022   CREATININE 1.14 03/28/2022   BUN 11 03/28/2022   CO2 28 03/28/2022   TSH 1.12 07/06/2021   INR 1.8 (H) 11/22/2020   HGBA1C 6.2 03/28/2022       Assessment & Plan:   Problem List Items Addressed This Visit     A-fib ([place])    History of afib.  On diltiazem and metoprolol.  Followed by cardiology.        Anemia    Follow cbc.       Relevant Orders   Ambulatory referral to Gastroenterology   CKD (chronic kidney disease) stage 3, GFR 30-59 ml/min (HCC)    Avoid antiinflammatories.  Stay hydrated.  Follow met b.       Relevant Orders   Basic metabolic panel (Completed)   Urinalysis, Routine w reflex microscopic (  Completed)   Fatty liver   GERD (gastroesophageal reflux disease)    Takes prilosec.       History of colonic polyps    Colonoscopy 06/2015.  Recommended f/u colonoscopy in 06/2020.  Saw GI.  Declined further scopes at that time. Overdue f/u.        Relevant Orders   Ambulatory referral to Gastroenterology   Hypercholesterolemia    On simvastatin.  Low cholesterol diet and exercise.  Follow lipid panel and liver function tests.        Relevant Orders   Lipid panel (Completed)   Hyperglycemia    Follow met b and a1c.       Relevant Orders   Hemoglobin A1c (Completed)   Hypertension - Primary     On diltiazem and lasix.  Blood pressures doing well.  Follow.         Leukocytosis    Recheck cbc to confirm wnl.       Relevant Orders   CBC with Differential/Platelet (Completed)   Rheumatoid arthritis ([place])   Relevant Orders   Hepatic function panel (Completed)     [name], MD

## 2022-03-29 LAB — LIPID PANEL
Cholesterol: 207 mg/dL — ABNORMAL HIGH (ref 0–200)
HDL: 79.9 mg/dL (ref >39.00)
LDL Cholesterol: 111 mg/dL — ABNORMAL HIGH (ref 0–99)
NonHDL: 126.84
Total CHOL/HDL Ratio: 3
Triglycerides: 81 mg/dL (ref 0.0–149.0)
VLDL: 16.2 mg/dL (ref 0.0–40.0)

## 2022-03-29 LAB — CBC WITH DIFFERENTIAL/PLATELET
Basophils Absolute: 0.1 10*3/uL (ref 0.0–0.1)
Basophils Relative: 0.8 % (ref 0.0–3.0)
Eosinophils Absolute: 0.1 10*3/uL (ref 0.0–0.7)
Eosinophils Relative: 1.1 % (ref 0.0–5.0)
HCT: 35.4 % — ABNORMAL LOW (ref 36.0–46.0)
Hemoglobin: 12.3 g/dL (ref 12.0–15.0)
Lymphocytes Relative: 28.2 % (ref 12.0–46.0)
Lymphs Abs: 2 10*3/uL (ref 0.7–4.0)
MCHC: 34.8 g/dL (ref 30.0–36.0)
MCV: 92.1 fl (ref 78.0–100.0)
Monocytes Absolute: 0.5 10*3/uL (ref 0.1–1.0)
Monocytes Relative: 7.2 % (ref 3.0–12.0)
Neutro Abs: 4.5 10*3/uL (ref 1.4–7.7)
Neutrophils Relative %: 62.7 % (ref 43.0–77.0)
Platelets: 211 10*3/uL (ref 150.0–400.0)
RBC: 3.84 Mil/uL — ABNORMAL LOW (ref 3.87–5.11)
RDW: 14.1 % (ref 11.5–15.5)
WBC: 7.1 10*3/uL (ref 4.0–10.5)

## 2022-03-29 LAB — HEPATIC FUNCTION PANEL
ALT: 15 U/L (ref 0–35)
AST: 29 U/L (ref 0–37)
Albumin: 4.3 g/dL (ref 3.5–5.2)
Alkaline Phosphatase: 54 U/L (ref 39–117)
Bilirubin, Direct: 0.1 mg/dL (ref 0.0–0.3)
Total Bilirubin: 0.6 mg/dL (ref 0.2–1.2)
Total Protein: 7.6 g/dL (ref 6.0–8.3)

## 2022-03-29 LAB — BASIC METABOLIC PANEL
BUN: 11 mg/dL (ref 6–23)
CO2: 28 mEq/L (ref 19–32)
Calcium: 9.6 mg/dL (ref 8.4–10.5)
Chloride: 94 mEq/L — ABNORMAL LOW (ref 96–112)
Creatinine, Ser: 1.14 mg/dL (ref 0.40–1.20)
GFR: 45.98 mL/min — ABNORMAL LOW (ref >60.00)
Glucose, Bld: 104 mg/dL — ABNORMAL HIGH (ref 70–99)
Potassium: 4.1 mEq/L (ref 3.5–5.1)
Sodium: 132 mEq/L — ABNORMAL LOW (ref 135–145)

## 2022-03-29 LAB — URINALYSIS, ROUTINE W REFLEX MICROSCOPIC
Bilirubin Urine: NEGATIVE
Hgb urine dipstick: NEGATIVE
Ketones, ur: NEGATIVE
Leukocytes,Ua: NEGATIVE
Nitrite: NEGATIVE
RBC / HPF: NONE SEEN (ref 0–?)
Specific Gravity, Urine: <=1.005 — AB (ref 1.000–1.030)
Total Protein, Urine: NEGATIVE
Urine Glucose: NEGATIVE
Urobilinogen, UA: 0.2 (ref 0.0–1.0)
WBC, UA: NONE SEEN (ref 0–?)
pH: 7 (ref 5.0–8.0)

## 2022-03-29 LAB — HEMOGLOBIN A1C: Hgb A1c MFr Bld: 6.2 % (ref 4.6–6.5)

## 2022-04-01 ENCOUNTER — Other Ambulatory Visit: Payer: Self-pay

## 2022-04-01 DIAGNOSIS — E871 Hypo-osmolality and hyponatremia: Secondary | ICD-10-CM

## 2022-04-01 MED ORDER — ATORVASTATIN CALCIUM 20 MG PO TABS: 20.0000 mg | ORAL_TABLET | Freq: Every day | ORAL | 0 refills | Status: DC

## 2022-04-03 ENCOUNTER — Encounter: Payer: Self-pay | Admitting: Internal Medicine

## 2022-04-03 NOTE — Assessment & Plan Note (Signed)
Follow met b and a1c.  

## 2022-04-03 NOTE — Assessment & Plan Note (Signed)
Avoid antiinflammatories.  Stay hydrated.  Follow met b.   

## 2022-04-03 NOTE — Assessment & Plan Note (Signed)
On diltiazem and lasix.  Blood pressures doing well.  Follow.    

## 2022-04-03 NOTE — Assessment & Plan Note (Signed)
On simvastatin.  Low cholesterol diet and exercise.  Follow lipid panel and liver function tests.   

## 2022-04-03 NOTE — Assessment & Plan Note (Signed)
Follow cbc.  

## 2022-04-03 NOTE — Assessment & Plan Note (Signed)
Colonoscopy 06/2015.  Recommended f/u colonoscopy in 06/2020.  Saw GI.  Declined further scopes at that time. Overdue f/u.

## 2022-04-03 NOTE — Assessment & Plan Note (Signed)
History of afib.  On diltiazem and metoprolol.  Followed by cardiology.   

## 2022-04-03 NOTE — Assessment & Plan Note (Signed)
Takes prilosec.

## 2022-04-03 NOTE — Assessment & Plan Note (Signed)
Recheck cbc to confirm wnl.

## 2022-04-07 ENCOUNTER — Other Ambulatory Visit (INDEPENDENT_AMBULATORY_CARE_PROVIDER_SITE_OTHER): Payer: Medicare Other

## 2022-04-07 DIAGNOSIS — E871 Hypo-osmolality and hyponatremia: Secondary | ICD-10-CM

## 2022-04-07 LAB — SODIUM: Sodium: 135 mEq/L (ref 135–145)

## 2022-04-15 ENCOUNTER — Encounter: Payer: Self-pay | Admitting: Emergency Medicine

## 2022-04-15 ENCOUNTER — Other Ambulatory Visit: Payer: Self-pay

## 2022-04-15 ENCOUNTER — Emergency Department
Admission: EM | Admit: 2022-04-15 | Discharge: 2022-04-15 | Disposition: A | Discharge status: Home / Self Care | Payer: Medicare Other | Attending: Emergency Medicine | Admitting: Emergency Medicine

## 2022-04-15 DIAGNOSIS — R0689 Other abnormalities of breathing: Secondary | ICD-10-CM | POA: Diagnosis not present

## 2022-04-15 DIAGNOSIS — M069 Rheumatoid arthritis, unspecified: Secondary | ICD-10-CM | POA: Diagnosis not present

## 2022-04-15 DIAGNOSIS — I959 Hypotension, unspecified: Secondary | ICD-10-CM | POA: Diagnosis not present

## 2022-04-15 DIAGNOSIS — M79604 Pain in right leg: Secondary | ICD-10-CM

## 2022-04-15 DIAGNOSIS — M1611 Unilateral primary osteoarthritis, right hip: Secondary | ICD-10-CM | POA: Diagnosis not present

## 2022-04-15 LAB — BASIC METABOLIC PANEL
Anion gap: 11 (ref 5–15)
BUN: 15 mg/dL (ref 8–23)
CO2: 26 mmol/L (ref 22–32)
Calcium: 9.3 mg/dL (ref 8.9–10.3)
Chloride: 98 mmol/L (ref 98–111)
Creatinine, Ser: 1.08 mg/dL — ABNORMAL HIGH (ref 0.44–1.00)
GFR, Estimated: 53 mL/min — ABNORMAL LOW (ref >60)
Glucose, Bld: 115 mg/dL — ABNORMAL HIGH (ref 70–99)
Potassium: 3.2 mmol/L — ABNORMAL LOW (ref 3.5–5.1)
Sodium: 135 mmol/L (ref 135–145)

## 2022-04-15 LAB — CBC WITH DIFFERENTIAL/PLATELET
Abs Immature Granulocytes: 0.07 10*3/uL (ref 0.00–0.07)
Basophils Absolute: 0 10*3/uL (ref 0.0–0.1)
Basophils Relative: 0 %
Eosinophils Absolute: 0 10*3/uL (ref 0.0–0.5)
Eosinophils Relative: 0 %
HCT: 32.8 % — ABNORMAL LOW (ref 36.0–46.0)
Hemoglobin: 11.2 g/dL — ABNORMAL LOW (ref 12.0–15.0)
Immature Granulocytes: 1 %
Lymphocytes Relative: 15 %
Lymphs Abs: 2 10*3/uL (ref 0.7–4.0)
MCH: 30.8 pg (ref 26.0–34.0)
MCHC: 34.1 g/dL (ref 30.0–36.0)
MCV: 90.1 fL (ref 80.0–100.0)
Monocytes Absolute: 1.5 10*3/uL — ABNORMAL HIGH (ref 0.1–1.0)
Monocytes Relative: 12 %
Neutro Abs: 9.3 10*3/uL — ABNORMAL HIGH (ref 1.7–7.7)
Neutrophils Relative %: 72 %
Platelets: 362 10*3/uL (ref 150–400)
RBC: 3.64 MIL/uL — ABNORMAL LOW (ref 3.87–5.11)
RDW: 12.8 % (ref 11.5–15.5)
WBC: 12.9 10*3/uL — ABNORMAL HIGH (ref 4.0–10.5)
nRBC: 0 % (ref 0.0–0.2)

## 2022-04-15 LAB — SEDIMENTATION RATE: Sed Rate: 66 mm/hr — ABNORMAL HIGH (ref 0–30)

## 2022-04-15 MED ORDER — PREDNISONE 10 MG PO TABS: ORAL_TABLET | ORAL | 0 refills | Status: AC

## 2022-04-15 MED ORDER — LIDOCAINE 5 % EX PTCH
1.0000 | MEDICATED_PATCH | CUTANEOUS | Status: DC
  Administered 2022-04-15: 1 via TRANSDERMAL
  Filled 2022-04-15: qty 1

## 2022-04-15 MED ORDER — KETOROLAC TROMETHAMINE 30 MG/ML IJ SOLN
7.5000 mg | Freq: Once | INTRAMUSCULAR | Status: AC
  Administered 2022-04-15: 7.5 mg via INTRAVENOUS
  Filled 2022-04-15: qty 1

## 2022-04-15 MED ORDER — MORPHINE SULFATE (PF) 2 MG/ML IV SOLN
2.0000 mg | Freq: Once | INTRAVENOUS | Status: AC
  Administered 2022-04-15: 2 mg via INTRAVENOUS
  Filled 2022-04-15: qty 1

## 2022-04-15 MED ORDER — LIDOCAINE 5 % EX PTCH: 1.0000 | MEDICATED_PATCH | Freq: Two times a day (BID) | CUTANEOUS | 1 refills | Status: DC

## 2022-04-15 MED ORDER — ACETAMINOPHEN 500 MG PO TABS
1000.0000 mg | ORAL_TABLET | Freq: Once | ORAL | Status: AC
  Administered 2022-04-15: 1000 mg via ORAL
  Filled 2022-04-15: qty 2

## 2022-04-15 MED ORDER — METHYLPREDNISOLONE SODIUM SUCC 125 MG IJ SOLR
125.0000 mg | Freq: Once | INTRAMUSCULAR | Status: AC
  Administered 2022-04-15: 125 mg via INTRAVENOUS
  Filled 2022-04-15: qty 2

## 2022-04-15 NOTE — ED Triage Notes (Addendum)
Pt arrived via POV from home, pt reports hx of RA, pt has had flare up earlier this week, pt states tonight her R leg has locked up and she is unable to move it.  Pt will finish steroid taper in the morning

## 2022-04-15 NOTE — ED Notes (Signed)
Pt ambulatory with standby assist by this RN without difficulty. EDP notified.

## 2022-04-15 NOTE — ED Provider Notes (Signed)
[facility] Provider Note    Event Date/Time   First MD Initiated Contact with Patient 04/15/22 0319     (approximate)   History   Leg Pain (RA Flare-Up)   HPI  Kaitlyn Huff is a 81 y.o. female who presents to the ED for evaluation of Leg Pain (RA Flare-Up)   I reviewed rheumatology clinic visit from 8/23.  Treated with Remicade and prednisone taper.  Patient presents with her daughter for evaluation of right-sided hip and knee pain consistent with previous rheumatoid arthritis flares.  She reports finishing up a 6-day taper of prednisone, but her pain seems to be worsening in the past 2 days.  No fevers, falls or injuries.  Reports poorly controlled pain at home so she presents to the ED.   Physical Exam   Triage Vital Signs: ED Triage Vitals  Enc Vitals Group     BP 04/15/22 0133 (!) 138/90     Pulse Rate 04/15/22 0133 93     Resp 04/15/22 0133 18     Temp 04/15/22 0133 98.3 F (36.8 C)     Temp Source 04/15/22 0133 Oral     SpO2 04/15/22 0133 97 %     Weight 04/15/22 0139 123 lb 6.4 oz (56 kg)     Height 04/15/22 0139 5\' 5"  (1.651 m)     Head Circumference --      Peak Flow --      Pain Score 04/15/22 0136 8     Pain Loc --      Pain Edu? --      Excl. in GC? --     Most recent vital signs: Vitals:   04/15/22 0423 04/15/22 0530  BP: 127/69 122/70  Pulse: 80 77  Resp: 18 18  Temp:  98 F (36.7 C)  SpO2: 98% 98%    General: Awake.  Laying on her right side and refusing to move.  Seems uncomfortable. CV:  Good peripheral perfusion.  Resp:  Normal effort.  Abd:  No distention.  MSK:  No deformity noted.  Neuro:  No focal deficits appreciated. Other:    On my reevaluations after she is provided analgesia, she is lying supine on her back and much more pleasant, smiling and joking.  Looks well.  No signs of trauma, skin changes, erythema or swelling to her areas of pain along her right leg.  Able to get up and ambulate at her  baseline.  ED Results / Procedures / Treatments   Labs (all labs ordered are listed, but only abnormal results are displayed) Labs Reviewed  CBC WITH DIFFERENTIAL/PLATELET - Abnormal; Notable for the following components:      Result Value   WBC 12.9 (*)    RBC 3.64 (*)    Hemoglobin 11.2 (*)    HCT 32.8 (*)    Neutro Abs 9.3 (*)    Monocytes Absolute 1.5 (*)    All other components within normal limits  BASIC METABOLIC PANEL - Abnormal; Notable for the following components:   Potassium 3.2 (*)    Glucose, Bld 115 (*)    Creatinine, Ser 1.08 (*)    GFR, Estimated 53 (*)    All other components within normal limits  SEDIMENTATION RATE - Abnormal; Notable for the following components:   Sed Rate 66 (*)    All other components within normal limits    EKG   RADIOLOGY   Official radiology report(s): No results found.  PROCEDURES and  INTERVENTIONS:  Procedures  Medications  lidocaine (LIDODERM) 5 % 1 patch (1 patch Transdermal Patch Applied 04/15/22 0518)  methylPREDNISolone sodium succinate (SOLU-MEDROL) 125 mg/2 mL injection 125 mg (125 mg Intravenous Given 04/15/22 0413)  ketorolac (TORADOL) 30 MG/ML injection 7.5 mg (7.5 mg Intravenous Given 04/15/22 0414)  morphine (PF) 2 MG/ML injection 2 mg (2 mg Intravenous Given 04/15/22 0415)  acetaminophen (TYLENOL) tablet 1,000 mg (1,000 mg Oral Given 04/15/22 0412)     IMPRESSION / MDM / ASSESSMENT AND PLAN / ED COURSE  I reviewed the triage vital signs and the nursing notes.  Differential diagnosis includes, but is not limited to, hip fracture, cellulitis, sepsis, shingles, RA flare, elder abuse  {Patient presents with symptoms of an acute illness or injury that is potentially life-threatening.  81 year old woman with history of RA presents with evidence of an RA flare suitable for outpatient management with a prednisone taper.  No signs of neurologic or vascular deficits.  No signs of trauma, cellulitis or herpes zoster.   Screening blood work with mild overall stigmata of inflammation with elevated ESR and slightly elevated WBCs.  This certainly may be related to her current prednisone course and I doubt underlying sepsis or infectious etiology of her symptoms.  Metabolic panel with CKD around baseline.  No falls or injuries or decreased range of motion to indicate imaging.  After starting her on higher dose steroids and some analgesia she is feeling much better and up and moving around her baseline.  We will discharge with a longer prednisone taper, some lidocaine patches and refer her back to her rheumatologist.  Clinical Course as of 04/15/22 0535  Fri Apr 15, 2022  0457 Reassessed.  Feeling better. [DS]  5638 Patient up and ambulatory around her baseline.  I suspect she will be suitable for outpatient management.  We discussed care at home, following up with her rheumatologist as well as return precautions for the ED. [DS]    Clinical Course User Index [DS] Vladimir Crofts, MD     FINAL CLINICAL IMPRESSION(S) / ED DIAGNOSES   Final diagnoses:  Right leg pain  Rheumatoid arthritis flare ([place])     Rx / DC Orders   ED Discharge Orders          Ordered    lidocaine (LIDODERM) 5 %  Every 12 hours        04/15/22 0518    predniSONE (DELTASONE) 10 MG tablet  Daily        04/15/22 0518             Note:  This document was prepared using Dragon voice recognition software and may include unintentional dictation errors.   Vladimir Crofts, MD 04/15/22 [phone]

## 2022-04-15 NOTE — Discharge Instructions (Addendum)
You are being discharged with a prescription for prednisone steroids to take daily for the next couple weeks to finish a longer taper.  Take as prescribed and finish this whole course.  Use Tylenol for pain and fevers.  Up to 1000 mg per dose, up to 4 times per day.  Do not take more than 4000 mg of Tylenol/acetaminophen within 24 hours..  Please use lidocaine patches at your site of pain.  Apply 1 patch at a time, leave on for 12 hours, then remove for 12 hours.  12 hours on, 12 hours off.  Do not apply more than 1 patch at a time.

## 2022-04-19 ENCOUNTER — Telehealth: Payer: Self-pay | Admitting: *Deleted

## 2022-04-19 NOTE — Patient Outreach (Signed)
  Care Coordination   04/19/2022 Name: MELENA HAYES MRN: [id number] DOB: 1941-06-10   Care Coordination Outreach Attempts:  An unsuccessful telephone outreach was attempted today to offer the patient information about available care coordination services as a benefit of their health plan.   Follow Up Plan:  Additional outreach attempts will be made to offer the patient care coordination information and services.   Encounter Outcome:  No Answer  Care Coordination Interventions Activated:  Yes   Care Coordination Interventions:  No, not indicated    [facility] [facility] [phone]

## 2022-04-20 ENCOUNTER — Telehealth: Payer: Self-pay

## 2022-04-20 NOTE — Telephone Encounter (Signed)
     Patient  visit on 10/6  at [facility] Have you been able to follow up with your primary care physician? yes  The patient was or was not able to obtain any needed medicine or equipment.yes  Are there diet recommendations that you are having difficulty following? na  Patient expresses understanding of discharge instructions and education provided has no other needs at this time. yes    Kaitlyn Huff Pop Health Care Guide, [place], Care Management  [phone] [address], [place], [place] [address] Phone: [phone] Email: [email or web address]    

## 2022-04-24 ENCOUNTER — Other Ambulatory Visit: Payer: Self-pay | Admitting: Internal Medicine

## 2022-04-25 DIAGNOSIS — Z79899 Other long term (current) drug therapy: Secondary | ICD-10-CM | POA: Diagnosis not present

## 2022-04-25 DIAGNOSIS — M0579 Rheumatoid arthritis with rheumatoid factor of multiple sites without organ or systems involvement: Secondary | ICD-10-CM | POA: Diagnosis not present

## 2022-04-27 DIAGNOSIS — M810 Age-related osteoporosis without current pathological fracture: Secondary | ICD-10-CM | POA: Diagnosis not present

## 2022-04-27 DIAGNOSIS — M0579 Rheumatoid arthritis with rheumatoid factor of multiple sites without organ or systems involvement: Secondary | ICD-10-CM | POA: Diagnosis not present

## 2022-05-06 ENCOUNTER — Ambulatory Visit (INDEPENDENT_AMBULATORY_CARE_PROVIDER_SITE_OTHER): Payer: Medicare Other | Admitting: Internal Medicine

## 2022-05-06 ENCOUNTER — Encounter: Payer: Self-pay | Admitting: Internal Medicine

## 2022-05-06 VITALS — BP 122/70 | HR 66 | Temp 98.5°F | Ht 65.0 in | Wt 126.2 lb

## 2022-05-06 DIAGNOSIS — I4891 Unspecified atrial fibrillation: Secondary | ICD-10-CM | POA: Diagnosis not present

## 2022-05-06 DIAGNOSIS — D649 Anemia, unspecified: Secondary | ICD-10-CM

## 2022-05-06 DIAGNOSIS — R739 Hyperglycemia, unspecified: Secondary | ICD-10-CM | POA: Diagnosis not present

## 2022-05-06 DIAGNOSIS — N1831 Chronic kidney disease, stage 3a: Secondary | ICD-10-CM

## 2022-05-06 DIAGNOSIS — Z86010 Personal history of colonic polyps: Secondary | ICD-10-CM

## 2022-05-06 DIAGNOSIS — E7800 Pure hypercholesterolemia, unspecified: Secondary | ICD-10-CM

## 2022-05-06 DIAGNOSIS — E871 Hypo-osmolality and hyponatremia: Secondary | ICD-10-CM

## 2022-05-06 DIAGNOSIS — G2581 Restless legs syndrome: Secondary | ICD-10-CM | POA: Diagnosis not present

## 2022-05-06 DIAGNOSIS — I10 Essential (primary) hypertension: Secondary | ICD-10-CM | POA: Diagnosis not present

## 2022-05-06 DIAGNOSIS — K760 Fatty (change of) liver, not elsewhere classified: Secondary | ICD-10-CM | POA: Diagnosis not present

## 2022-05-06 DIAGNOSIS — M069 Rheumatoid arthritis, unspecified: Secondary | ICD-10-CM | POA: Diagnosis not present

## 2022-05-06 DIAGNOSIS — K219 Gastro-esophageal reflux disease without esophagitis: Secondary | ICD-10-CM | POA: Diagnosis not present

## 2022-05-06 LAB — CBC WITH DIFFERENTIAL/PLATELET
Basophils Absolute: 0 10*3/uL (ref 0.0–0.1)
Basophils Relative: 0.4 % (ref 0.0–3.0)
Eosinophils Absolute: 0 10*3/uL (ref 0.0–0.7)
Eosinophils Relative: 0.3 % (ref 0.0–5.0)
HCT: 32.8 % — ABNORMAL LOW (ref 36.0–46.0)
Hemoglobin: 11.3 g/dL — ABNORMAL LOW (ref 12.0–15.0)
Lymphocytes Relative: 15.5 % (ref 12.0–46.0)
Lymphs Abs: 1.3 10*3/uL (ref 0.7–4.0)
MCHC: 34.5 g/dL (ref 30.0–36.0)
MCV: 90.8 fl (ref 78.0–100.0)
Monocytes Absolute: 0.4 10*3/uL (ref 0.1–1.0)
Monocytes Relative: 4.9 % (ref 3.0–12.0)
Neutro Abs: 6.4 10*3/uL (ref 1.4–7.7)
Neutrophils Relative %: 78.9 % — ABNORMAL HIGH (ref 43.0–77.0)
Platelets: 216 10*3/uL (ref 150.0–400.0)
RBC: 3.62 Mil/uL — ABNORMAL LOW (ref 3.87–5.11)
RDW: 14.5 % (ref 11.5–15.5)
WBC: 8.1 10*3/uL (ref 4.0–10.5)

## 2022-05-06 LAB — BASIC METABOLIC PANEL
BUN: 12 mg/dL (ref 6–23)
CO2: 26 mEq/L (ref 19–32)
Calcium: 9.4 mg/dL (ref 8.4–10.5)
Chloride: 96 mEq/L (ref 96–112)
Creatinine, Ser: 0.96 mg/dL (ref 0.40–1.20)
GFR: 56.47 mL/min — ABNORMAL LOW (ref >60.00)
Glucose, Bld: 112 mg/dL — ABNORMAL HIGH (ref 70–99)
Potassium: 4.2 mEq/L (ref 3.5–5.1)
Sodium: 132 mEq/L — ABNORMAL LOW (ref 135–145)

## 2022-05-06 LAB — VITAMIN B12: Vitamin B-12: 1100 pg/mL — ABNORMAL HIGH (ref 211–911)

## 2022-05-06 NOTE — Progress Notes (Unsigned)
Patient ID: Kaitlyn Huff, female   DOB: 1941-03-10, 81 y.o.   MRN: [id number]   Subjective:    Patient ID: Kaitlyn Huff, female    DOB: 10/25/1940, 81 y.o.   MRN: [id number]    Patient here for  Chief Complaint  Patient presents with   Follow-up   .   HPI Here to follow up regarding hypercholesterolemia, RA and hypertension.  Seeing rheumatology.  Flare recently.  On remicade - dose increased recently.  Doing better now.  Back on low dose prednisone daily.  No chest pain.  Breathing stable.  No increased cough or congestion.  No abdominal pain.  Having bowel movement - qod to q day.  Has f/u with GI 07/2022.     Past Medical History:  Diagnosis Date   Anemia    Cancer ([place])    Diverticulitis    GERD (gastroesophageal reflux disease)    Hyperlipidemia    Hypertension    Osteoarthritis    Osteoporosis    actonel   Positive PPD    s/p INH (2006)   Rheumatoid arthritis(714.0)    MTX transaminitis, Leflunomide (rash), enbrel, plaquinil, prednisone, remicade, Imuran (transaminitis)   Valvular heart disease    moderate MR and TR   Past Surgical History:  Procedure Laterality Date   ABDOMINAL HYSTERECTOMY     CERVICAL CONE BIOPSY     CHOLECYSTECTOMY  06/22/14   COLONOSCOPY WITH PROPOFOL N/A 07/09/2015   Procedure: COLONOSCOPY WITH PROPOFOL;  Surgeon: Manya Silvas, MD;  Location: [facility];  Service: Endoscopy;  Laterality: N/A;   ESOPHAGOGASTRODUODENOSCOPY (EGD) WITH PROPOFOL N/A 07/09/2015   Procedure: ESOPHAGOGASTRODUODENOSCOPY (EGD) WITH PROPOFOL;  Surgeon: Manya Silvas, MD;  Location: [facility] ENDOSCOPY;  Service: Endoscopy;  Laterality: N/A;   OVARY SURGERY     TRACHEOSTOMY  1959   TUBAL LIGATION     Family History  Problem Relation Age of Onset   Heart disease Father        MI   Heart disease Mother    Valvular heart disease Mother    Breast cancer Sister 66   Cancer Sister        Lung cancer   COPD Sister    Heart disease Sister    Colon cancer Neg  Hx    Social History   Socioeconomic History   Marital status: Widowed    Spouse name: Not on file   Number of children: 4   Years of education: Not on file   Highest education level: Not on file  Occupational History   Occupation: retired  Tobacco Use   Smoking status: Never   Smokeless tobacco: Never  Substance and Sexual Activity   Alcohol use: No    Alcohol/week: 0.0 standard drinks of alcohol   Drug use: No   Sexual activity: Never  Other Topics Concern   Not on file  Social History Narrative   Lives with family at home   Social Determinants of Health   Financial Resource Strain: Low Risk  (08/17/2021)   Overall Financial Resource Strain (CARDIA)    Difficulty of Paying Living Expenses: Not hard at all  Food Insecurity: No [place] (08/17/2021)   Hunger Vital Sign    Worried About Running Out of Food in the Last Year: Never true    [place] in the Last Year: Never true  Transportation Needs: No Transportation Needs (08/17/2021)   PRAPARE - Hydrologist (Medical): No  Lack of Transportation (Non-Medical): No  Physical Activity: Not on file  Stress: No Stress Concern Present (08/17/2021)   [place]    Feeling of Stress : Not at all  Social Connections: Unknown (08/17/2021)   Social Connection and Isolation Panel [NHANES]    Frequency of Communication with Friends and Family: More than three times a week    Frequency of Social Gatherings with Friends and Family: More than three times a week    Attends Religious Services: Not on Advertising copywriter or Organizations: Yes    Attends Archivist Meetings: Not on file    Marital Status: Not on file     Review of Systems  Constitutional:  Negative for appetite change and unexpected weight change.  HENT:  Negative for congestion and sinus pressure.   Respiratory:  Negative for cough, chest  tightness and shortness of breath.   Cardiovascular:  Negative for chest pain, palpitations and leg swelling.  Gastrointestinal:  Negative for abdominal pain, diarrhea, nausea and vomiting.  Genitourinary:  Negative for difficulty urinating and dysuria.  Musculoskeletal:  Negative for joint swelling and myalgias.  Skin:  Negative for color change and rash.  Neurological:  Negative for dizziness and headaches.  Psychiatric/Behavioral:  Negative for agitation and dysphoric mood.        Objective:     BP 122/70 (BP Location: Left Arm, Patient Position: Sitting, Cuff Size: Normal)   Pulse 66   Temp 98.5 F (36.9 C) (Oral)   Ht _0  (1.651 m)   Wt 126 lb 3.2 oz (57.2 kg)   SpO2 97%   BMI 21.00 kg/m  Wt Readings from Last 3 Encounters:  05/06/22 126 lb 3.2 oz (57.2 kg)  04/15/22 123 lb 6.4 oz (56 kg)  03/28/22 123 lb 6.4 oz (56 kg)    Physical Exam Vitals reviewed.  Constitutional:      General: She is not in acute distress.    Appearance: Normal appearance.  HENT:     Head: Normocephalic and atraumatic.     Right Ear: External ear normal.     Left Ear: External ear normal.  Eyes:     General: No scleral icterus.       Right eye: No discharge.        Left eye: No discharge.     Conjunctiva/sclera: Conjunctivae normal.  Neck:     Thyroid: No thyromegaly.  Cardiovascular:     Rate and Rhythm: Normal rate and regular rhythm.  Pulmonary:     Effort: No respiratory distress.     Breath sounds: Normal breath sounds. No wheezing.  Abdominal:     General: Bowel sounds are normal.     Palpations: Abdomen is soft.     Tenderness: There is no abdominal tenderness.  Musculoskeletal:        General: No swelling or tenderness.     Cervical back: Neck supple. No tenderness.  Lymphadenopathy:     Cervical: No cervical adenopathy.  Skin:    Findings: No erythema or rash.  Neurological:     Mental Status: She is alert.  Psychiatric:        Mood and Affect: Mood normal.         Behavior: Behavior normal.      Outpatient Encounter Medications as of 05/06/2022  Medication Sig   albuterol (VENTOLIN HFA) 108 (90 Base) MCG/ACT inhaler USE 2 INHALATIONS EVERY 6 HOURS AS  NEEDED FOR WHEEZING OR SHORTNESS OF BREATH   alendronate (FOSAMAX) 70 MG tablet Take 70 mg by mouth every Monday.    ALPRAZolam (XANAX) 0.25 MG tablet Take 1 tablet (0.25 mg total) by mouth 2 (two) times daily as needed for anxiety.   apixaban (ELIQUIS) 5 MG TABS tablet Hold until followup with Dr. Ubaldo Glassing due to your hemorrhoid bleeding and hemoglobin drop.   atorvastatin (LIPITOR) 20 MG tablet Take 1 tablet (20 mg total) by mouth daily.   Calcium Carbonate-Vitamin D 600-400 MG-UNIT tablet Take 1 tablet by mouth 2 (two) times daily.    cetirizine (ZYRTEC) 10 MG tablet Take 1 tablet (10 mg total) by mouth daily.   cholecalciferol (VITAMIN D) 1000 units tablet Take 1,000 Units by mouth daily.   colestipol (COLESTID) 1 g tablet Take 2 g by mouth daily.   diltiazem (CARDIZEM CD) 180 MG 24 hr capsule Take 180 mg by mouth daily.   fluticasone (FLONASE) 50 MCG/ACT nasal spray USE 2 SPRAYS IN EACH NOSTRIL DAILY (CALL OFFICE SOON TO RESCHEDULE YOUR PHYSICAL)   furosemide (LASIX) 40 MG tablet Take 1 tablet by mouth daily.   gabapentin (NEURONTIN) 100 MG capsule Take 100 mg by mouth daily.   lidocaine (LIDODERM) 5 % Place 1 patch onto the skin every 12 (twelve) hours. Remove & Discard patch within 12 hours or as directed by MD   lip balm (BLISTEX) OINT Apply 1 application topically as needed for lip care.   Multiple Vitamin (MULTIVITAMIN WITH MINERALS) TABS tablet Take 1 tablet by mouth daily.   omeprazole (PRILOSEC) 20 MG capsule Take 1 capsule by mouth daily.   potassium chloride (MICRO-K) 10 MEQ CR capsule Hold while not taking lasix.   predniSONE (DELTASONE) 5 MG tablet Take 5 mg by mouth daily.   Probiotic Product (ALIGN PO) Take 1 tablet by mouth daily.   spironolactone (ALDACTONE) 25 MG tablet Take 12.5 mg  by mouth daily.   tiZANidine (ZANAFLEX) 4 MG tablet Take by mouth.   traMADol (ULTRAM) 50 MG tablet Take 50 mg by mouth 2 (two) times daily as needed.   No facility-administered encounter medications on file as of 05/06/2022.     Lab Results  Component Value Date   WBC 8.1 05/06/2022   HGB 11.3 (L) 05/06/2022   HCT 32.8 (L) 05/06/2022   PLT 216.0 05/06/2022   GLUCOSE 112 (H) 05/06/2022   CHOL 207 (H) 03/28/2022   TRIG 81.0 03/28/2022   HDL 79.90 03/28/2022   LDLDIRECT 73.7 08/20/2013   LDLCALC 111 (H) 03/28/2022   ALT 15 03/28/2022   AST 29 03/28/2022   NA 132 (L) 05/06/2022   K 4.2 05/06/2022   CL 96 05/06/2022   CREATININE 0.96 05/06/2022   BUN 12 05/06/2022   CO2 26 05/06/2022   TSH 1.12 07/06/2021   INR 1.8 (H) 11/22/2020   HGBA1C 6.2 03/28/2022       Assessment & Plan:   Problem List Items Addressed This Visit     A-fib ([place])    History of afib.  On diltiazem and metoprolol.  Followed by cardiology.        Anemia    Check cbc, iron studies and B12.       Relevant Orders   CBC with Differential/Platelet (Completed)   IBC + Ferritin   Vitamin B12 (Completed)   CKD (chronic kidney disease) stage 3, GFR 30-59 ml/min (HCC) - Primary    Avoid antiinflammatories.  Stay hydrated.  Follow met b.  Fatty liver    Has been followed by GI.  Follow liver function tests.       GERD (gastroesophageal reflux disease)    Controlled on prilosec.       History of colonic polyps    Has f/u planned with GI 07/2022.       Hypercholesterolemia    On simvastatin.  Low cholesterol diet and exercise.  Follow lipid panel and liver function tests.        Hyperglycemia    Follow met b and a1c.       Hypertension    On diltiazem and lasix.  Blood pressures doing well.  Follow.         Hyponatremia   Relevant Orders   Basic metabolic panel (Completed)   Restless leg syndrome    On gabapentin.       Rheumatoid arthritis ([place])    Followed by rheumatology.   On low dose prednisone.  Follow.  Doing better.         [name], MD

## 2022-05-07 ENCOUNTER — Encounter: Payer: Self-pay | Admitting: Internal Medicine

## 2022-05-07 NOTE — Assessment & Plan Note (Signed)
History of afib.  On diltiazem and metoprolol.  Followed by cardiology.

## 2022-05-07 NOTE — Assessment & Plan Note (Signed)
On simvastatin.  Low cholesterol diet and exercise.  Follow lipid panel and liver function tests.   

## 2022-05-07 NOTE — Assessment & Plan Note (Signed)
Avoid antiinflammatories.  Stay hydrated.  Follow met b.   

## 2022-05-07 NOTE — Assessment & Plan Note (Signed)
On diltiazem and lasix.  Blood pressures doing well.  Follow.

## 2022-05-07 NOTE — Assessment & Plan Note (Signed)
Has been followed by GI.  Follow liver function tests.

## 2022-05-07 NOTE — Assessment & Plan Note (Signed)
On gabapentin.  

## 2022-05-07 NOTE — Assessment & Plan Note (Signed)
Has f/u planned with GI 07/2022.

## 2022-05-07 NOTE — Assessment & Plan Note (Signed)
Controlled on prilosec.   

## 2022-05-07 NOTE — Assessment & Plan Note (Signed)
Check cbc, iron studies and B12.

## 2022-05-07 NOTE — Assessment & Plan Note (Signed)
Followed by rheumatology.  On low dose prednisone.  Follow.  Doing better.

## 2022-05-07 NOTE — Assessment & Plan Note (Signed)
Follow met b and a1c.  

## 2022-05-09 ENCOUNTER — Telehealth: Payer: Self-pay

## 2022-05-09 NOTE — Telephone Encounter (Signed)
Lm for pt to cb re:   Notify - kidney function has improved.  Sodium slightly decreased.  Will follow.  Hgb stable.  B12 wnl.  Recheck sodium - 2-3 weeks to confirm stable.

## 2022-05-30 ENCOUNTER — Other Ambulatory Visit (INDEPENDENT_AMBULATORY_CARE_PROVIDER_SITE_OTHER): Payer: Medicare Other

## 2022-05-30 DIAGNOSIS — D649 Anemia, unspecified: Secondary | ICD-10-CM

## 2022-05-30 LAB — IBC + FERRITIN
Ferritin: 80 ng/mL (ref 10.0–291.0)
Iron: 48 ug/dL (ref 42–145)
Saturation Ratios: 12 % — ABNORMAL LOW (ref 20.0–50.0)
TIBC: 400.4 ug/dL (ref 250.0–450.0)
Transferrin: 286 mg/dL (ref 212.0–360.0)

## 2022-05-31 ENCOUNTER — Other Ambulatory Visit (INDEPENDENT_AMBULATORY_CARE_PROVIDER_SITE_OTHER): Payer: Medicare Other

## 2022-05-31 ENCOUNTER — Other Ambulatory Visit: Payer: Self-pay | Admitting: Internal Medicine

## 2022-05-31 DIAGNOSIS — E871 Hypo-osmolality and hyponatremia: Secondary | ICD-10-CM

## 2022-05-31 LAB — SODIUM: Sodium: 129 mEq/L — ABNORMAL LOW (ref 135–145)

## 2022-05-31 NOTE — Progress Notes (Signed)
Order placed for f/u sodium.  ?

## 2022-06-05 ENCOUNTER — Other Ambulatory Visit: Payer: Self-pay | Admitting: *Deleted

## 2022-06-05 DIAGNOSIS — E871 Hypo-osmolality and hyponatremia: Secondary | ICD-10-CM

## 2022-06-06 ENCOUNTER — Other Ambulatory Visit (INDEPENDENT_AMBULATORY_CARE_PROVIDER_SITE_OTHER): Payer: Medicare Other

## 2022-06-06 DIAGNOSIS — E871 Hypo-osmolality and hyponatremia: Secondary | ICD-10-CM

## 2022-06-06 LAB — SODIUM: Sodium: 130 mEq/L — ABNORMAL LOW (ref 135–145)

## 2022-06-07 ENCOUNTER — Other Ambulatory Visit: Payer: Self-pay | Admitting: Internal Medicine

## 2022-06-07 DIAGNOSIS — E871 Hypo-osmolality and hyponatremia: Secondary | ICD-10-CM

## 2022-06-07 NOTE — Progress Notes (Signed)
Order placed for f/u labs.  

## 2022-06-22 ENCOUNTER — Other Ambulatory Visit: Payer: Self-pay

## 2022-06-22 ENCOUNTER — Telehealth: Payer: Self-pay | Admitting: Internal Medicine

## 2022-06-22 DIAGNOSIS — M0579 Rheumatoid arthritis with rheumatoid factor of multiple sites without organ or systems involvement: Secondary | ICD-10-CM | POA: Diagnosis not present

## 2022-06-22 MED ORDER — FLUTICASONE PROPIONATE 50 MCG/ACT NA SUSP: NASAL | 3 refills | Status: DC

## 2022-06-22 NOTE — Telephone Encounter (Signed)
Sent!

## 2022-06-22 NOTE — Telephone Encounter (Signed)
Prescription Request  06/22/2022  Is this a "Controlled Substance" medicine? No  LOV: 05/06/2022  What is the name of the medication or equipment? fluticasone (FLONASE) 50 MCG/ACT nasal spr   Have you contacted your pharmacy to request a refill? No   Which pharmacy would you like this sent to?  [facility] [place], [place] AT [address] [place] [address] Phone: [phone] Fax: [phone]     Patient notified that their request is being sent to the clinical staff for review and that they should receive a response within 2 business days.   Please advise at Mobile [phone] (mobile)

## 2022-06-23 ENCOUNTER — Other Ambulatory Visit (INDEPENDENT_AMBULATORY_CARE_PROVIDER_SITE_OTHER): Payer: Medicare Other

## 2022-06-23 DIAGNOSIS — E871 Hypo-osmolality and hyponatremia: Secondary | ICD-10-CM

## 2022-06-23 LAB — BASIC METABOLIC PANEL
BUN: 13 mg/dL (ref 6–23)
CO2: 30 mEq/L (ref 19–32)
Calcium: 9.5 mg/dL (ref 8.4–10.5)
Chloride: 98 mEq/L (ref 96–112)
Creatinine, Ser: 1.05 mg/dL (ref 0.40–1.20)
GFR: 50.66 mL/min — ABNORMAL LOW (ref >60.00)
Glucose, Bld: 98 mg/dL (ref 70–99)
Potassium: 4.3 mEq/L (ref 3.5–5.1)
Sodium: 135 mEq/L (ref 135–145)

## 2022-06-23 LAB — TSH: TSH: 1.72 u[IU]/mL (ref 0.35–5.50)

## 2022-06-24 ENCOUNTER — Encounter: Payer: Self-pay | Admitting: *Deleted

## 2022-06-25 LAB — SODIUM, URINE, RANDOM: Sodium, Ur: 43 mmol/L (ref 28–272)

## 2022-06-25 LAB — OSMOLALITY: Osmolality: 287 mOsm/kg (ref 278–305)

## 2022-06-25 LAB — OSMOLALITY, URINE: Osmolality, Ur: 172 mOsm/kg (ref 50–1200)

## 2022-07-11 ENCOUNTER — Other Ambulatory Visit: Payer: Self-pay | Admitting: Internal Medicine

## 2022-08-01 DIAGNOSIS — Z79899 Other long term (current) drug therapy: Secondary | ICD-10-CM | POA: Diagnosis not present

## 2022-08-01 DIAGNOSIS — M0579 Rheumatoid arthritis with rheumatoid factor of multiple sites without organ or systems involvement: Secondary | ICD-10-CM | POA: Diagnosis not present

## 2022-08-09 ENCOUNTER — Ambulatory Visit (INDEPENDENT_AMBULATORY_CARE_PROVIDER_SITE_OTHER): Payer: Medicare Other | Admitting: Internal Medicine

## 2022-08-09 ENCOUNTER — Encounter: Payer: Self-pay | Admitting: Internal Medicine

## 2022-08-09 VITALS — BP 120/70 | HR 61 | Temp 97.9°F | Resp 16 | Ht 65.0 in | Wt 125.8 lb

## 2022-08-09 DIAGNOSIS — E7800 Pure hypercholesterolemia, unspecified: Secondary | ICD-10-CM

## 2022-08-09 DIAGNOSIS — Z86010 Personal history of colonic polyps: Secondary | ICD-10-CM | POA: Diagnosis not present

## 2022-08-09 DIAGNOSIS — I4891 Unspecified atrial fibrillation: Secondary | ICD-10-CM | POA: Diagnosis not present

## 2022-08-09 DIAGNOSIS — N1831 Chronic kidney disease, stage 3a: Secondary | ICD-10-CM | POA: Diagnosis not present

## 2022-08-09 DIAGNOSIS — Z0000 Encounter for general adult medical examination without abnormal findings: Secondary | ICD-10-CM

## 2022-08-09 DIAGNOSIS — I10 Essential (primary) hypertension: Secondary | ICD-10-CM | POA: Diagnosis not present

## 2022-08-09 DIAGNOSIS — M069 Rheumatoid arthritis, unspecified: Secondary | ICD-10-CM

## 2022-08-09 DIAGNOSIS — R109 Unspecified abdominal pain: Secondary | ICD-10-CM | POA: Diagnosis not present

## 2022-08-09 DIAGNOSIS — I872 Venous insufficiency (chronic) (peripheral): Secondary | ICD-10-CM

## 2022-08-09 DIAGNOSIS — R739 Hyperglycemia, unspecified: Secondary | ICD-10-CM | POA: Diagnosis not present

## 2022-08-09 DIAGNOSIS — D649 Anemia, unspecified: Secondary | ICD-10-CM | POA: Diagnosis not present

## 2022-08-09 DIAGNOSIS — K760 Fatty (change of) liver, not elsewhere classified: Secondary | ICD-10-CM

## 2022-08-09 DIAGNOSIS — K219 Gastro-esophageal reflux disease without esophagitis: Secondary | ICD-10-CM | POA: Diagnosis not present

## 2022-08-09 NOTE — Progress Notes (Signed)
Subjective:    Patient ID: Kaitlyn Huff, female    DOB: Nov 17, 1940, 82 y.o.   MRN: [id number]  Patient here for  Chief Complaint  Patient presents with   Annual Exam    HPI With past history of afib, RA and hypercholesterolemia.  She comes in today to follow up on these issues as well as for a complete physical exam.  Reports she is doing relatively well.  Seeing rheumatology for f/u RA.  On remicade.  Right ankle swells intermittently.  Compression hose help.  No chest pain or sob reported.  She has been followed by Dr Ubaldo Glassing for paroxysmal afib.  Off eliquis - per his note - due to bleeding risk.  Dr Ubaldo Glassing retired.  Request f/u with Dr Saralyn Pilar.  Some fatigue.  No nausea or vomiting.  Some issues with her bowels.  Discussed benefiber and miralax - to keep regular.     Past Medical History:  Diagnosis Date   Anemia    Cancer ([place])    Diverticulitis    GERD (gastroesophageal reflux disease)    Hyperlipidemia    Hypertension    Osteoarthritis    Osteoporosis    actonel   Positive PPD    s/p INH (2006)   Rheumatoid arthritis(714.0)    MTX transaminitis, Leflunomide (rash), enbrel, plaquinil, prednisone, remicade, Imuran (transaminitis)   Valvular heart disease    moderate MR and TR   Past Surgical History:  Procedure Laterality Date   ABDOMINAL HYSTERECTOMY     CERVICAL CONE BIOPSY     CHOLECYSTECTOMY  06/22/14   COLONOSCOPY WITH PROPOFOL N/A 07/09/2015   Procedure: COLONOSCOPY WITH PROPOFOL;  Surgeon: Manya Silvas, MD;  Location: [facility];  Service: Endoscopy;  Laterality: N/A;   ESOPHAGOGASTRODUODENOSCOPY (EGD) WITH PROPOFOL N/A 07/09/2015   Procedure: ESOPHAGOGASTRODUODENOSCOPY (EGD) WITH PROPOFOL;  Surgeon: Manya Silvas, MD;  Location: [facility] ENDOSCOPY;  Service: Endoscopy;  Laterality: N/A;   OVARY SURGERY     TRACHEOSTOMY  1959   TUBAL LIGATION     Family History  Problem Relation Age of Onset   Heart disease Father        MI   Heart disease Mother     Valvular heart disease Mother    Breast cancer Sister 2   Cancer Sister        Lung cancer   COPD Sister    Heart disease Sister    Colon cancer Neg Hx    Social History   Socioeconomic History   Marital status: Widowed    Spouse name: Not on file   Number of children: 4   Years of education: Not on file   Highest education level: Not on file  Occupational History   Occupation: retired  Tobacco Use   Smoking status: Never   Smokeless tobacco: Never  Substance and Sexual Activity   Alcohol use: No    Alcohol/week: 0.0 standard drinks of alcohol   Drug use: No   Sexual activity: Never  Other Topics Concern   Not on file  Social History Narrative   Lives with family at home   Social Determinants of Health   Financial Resource Strain: Low Risk  (08/17/2021)   Overall Financial Resource Strain (CARDIA)    Difficulty of Paying Living Expenses: Not hard at all  Food Insecurity: No [place] (08/17/2021)   Hunger Vital Sign    Worried About Running Out of Food in the Last Year: Never true    Ran Out  of Food in the Last Year: Never true  Transportation Needs: No Transportation Needs (08/17/2021)   PRAPARE - Hydrologist (Medical): No    Lack of Transportation (Non-Medical): No  Physical Activity: Not on file  Stress: No Stress Concern Present (08/17/2021)   [place]    Feeling of Stress : Not at all  Social Connections: Unknown (08/17/2021)   Social Connection and Isolation Panel [NHANES]    Frequency of Communication with Friends and Family: More than three times a week    Frequency of Social Gatherings with Friends and Family: More than three times a week    Attends Religious Services: Not on Advertising copywriter or Organizations: Yes    Attends Archivist Meetings: Not on file    Marital Status: Not on file     Review of Systems  Constitutional:   Negative for appetite change and unexpected weight change.  HENT:  Negative for congestion and sinus pressure.   Respiratory:  Negative for cough, chest tightness and shortness of breath.   Cardiovascular:  Negative for chest pain and palpitations.       Ankle swelling as outlined.   Gastrointestinal:  Negative for nausea and vomiting.  Genitourinary:  Negative for difficulty urinating and dysuria.  Musculoskeletal:  Negative for joint swelling and myalgias.  Skin:  Negative for color change and rash.  Neurological:  Negative for dizziness and headaches.  Psychiatric/Behavioral:  Negative for agitation and dysphoric mood.        Objective:     BP 120/70   Pulse 61   Temp 97.9 F (36.6 C)   Resp 16   Ht '5\' 5"'$  (1.651 m)   Wt 125 lb 12.8 oz (57.1 kg)   SpO2 98%   BMI 20.93 kg/m  Wt Readings from Last 3 Encounters:  08/09/22 125 lb 12.8 oz (57.1 kg)  05/06/22 126 lb 3.2 oz (57.2 kg)  04/15/22 123 lb 6.4 oz (56 kg)    Physical Exam Vitals reviewed.  Constitutional:      General: She is not in acute distress.    Appearance: Normal appearance. She is well-developed.  HENT:     Head: Normocephalic and atraumatic.     Right Ear: External ear normal.     Left Ear: External ear normal.  Eyes:     General: No scleral icterus.       Right eye: No discharge.        Left eye: No discharge.     Conjunctiva/sclera: Conjunctivae normal.  Neck:     Thyroid: No thyromegaly.  Cardiovascular:     Rate and Rhythm: Normal rate and regular rhythm.  Pulmonary:     Effort: No tachypnea, accessory muscle usage or respiratory distress.     Breath sounds: Normal breath sounds. No decreased breath sounds, wheezing or rhonchi.  Chest:  Breasts:    Right: No inverted nipple, mass, nipple discharge or tenderness (no axillary adenopathy).     Left: No inverted nipple, mass, nipple discharge or tenderness (no axilarry adenopathy).  Abdominal:     General: Bowel sounds are normal.      Palpations: Abdomen is soft.     Tenderness: There is no abdominal tenderness.     Comments: Some discomfort - fullness - center.   Musculoskeletal:        General: No tenderness.     Cervical back: Neck supple.  No tenderness.     Comments: No increased swelling.   Lymphadenopathy:     Cervical: No cervical adenopathy.  Skin:    Findings: No erythema or rash.  Neurological:     Mental Status: She is alert and oriented to person, place, and time.  Psychiatric:        Mood and Affect: Mood normal.        Behavior: Behavior normal.      Outpatient Encounter Medications as of 08/09/2022  Medication Sig   albuterol (VENTOLIN HFA) 108 (90 Base) MCG/ACT inhaler USE 2 INHALATIONS EVERY 6 HOURS AS NEEDED FOR WHEEZING OR SHORTNESS OF BREATH   alendronate (FOSAMAX) 70 MG tablet Take 70 mg by mouth every Monday.    ALPRAZolam (XANAX) 0.25 MG tablet Take 1 tablet (0.25 mg total) by mouth 2 (two) times daily as needed for anxiety.   cholecalciferol (VITAMIN D) 1000 units tablet Take 1,000 Units by mouth daily.   colestipol (COLESTID) 1 g tablet Take 2 g by mouth daily.   diltiazem (CARDIZEM CD) 180 MG 24 hr capsule Take 180 mg by mouth daily.   fluticasone (FLONASE) 50 MCG/ACT nasal spray USE 2 SPRAYS IN EACH NOSTRIL DAILY (CALL OFFICE SOON TO RESCHEDULE YOUR PHYSICAL)   furosemide (LASIX) 40 MG tablet Take 1 tablet by mouth daily.   gabapentin (NEURONTIN) 100 MG capsule Take 100 mg by mouth daily.   lidocaine (LIDODERM) 5 % Place 1 patch onto the skin every 12 (twelve) hours. Remove & Discard patch within 12 hours or as directed by MD   lip balm (BLISTEX) OINT Apply 1 application topically as needed for lip care.   Multiple Vitamin (MULTIVITAMIN WITH MINERALS) TABS tablet Take 1 tablet by mouth daily.   omeprazole (PRILOSEC) 20 MG capsule Take 1 capsule by mouth daily.   predniSONE (DELTASONE) 5 MG tablet Take 5 mg by mouth daily.   Probiotic Product (ALIGN PO) Take 1 tablet by mouth daily.    spironolactone (ALDACTONE) 25 MG tablet Take 12.5 mg by mouth daily.   tiZANidine (ZANAFLEX) 4 MG tablet Take by mouth.   traMADol (ULTRAM) 50 MG tablet Take 50 mg by mouth 2 (two) times daily as needed.   [DISCONTINUED] apixaban (ELIQUIS) 5 MG TABS tablet Hold until followup with Dr. Ubaldo Glassing due to your hemorrhoid bleeding and hemoglobin drop. (Patient not taking: Reported on 08/09/2022)   [DISCONTINUED] atorvastatin (LIPITOR) 20 MG tablet Take 1 tablet (20 mg total) by mouth daily. (Patient not taking: Reported on 08/09/2022)   [DISCONTINUED] Calcium Carbonate-Vitamin D 600-400 MG-UNIT tablet Take 1 tablet by mouth 2 (two) times daily.    [DISCONTINUED] cetirizine (ZYRTEC) 10 MG tablet Take 1 tablet (10 mg total) by mouth daily.   [DISCONTINUED] potassium chloride (MICRO-K) 10 MEQ CR capsule Hold while not taking lasix. (Patient not taking: Reported on 08/09/2022)   No facility-administered encounter medications on file as of 08/09/2022.     Lab Results  Component Value Date   WBC 8.1 05/06/2022   HGB 11.3 (L) 05/06/2022   HCT 32.8 (L) 05/06/2022   PLT 216.0 05/06/2022   GLUCOSE 117 (H) 08/09/2022   CHOL 154 08/09/2022   TRIG 68.0 08/09/2022   HDL 97.70 08/09/2022   LDLDIRECT 73.7 08/20/2013   LDLCALC 43 08/09/2022   ALT 15 03/28/2022   AST 29 03/28/2022   NA 133 (L) 08/09/2022   K 4.2 08/09/2022   CL 97 08/09/2022   CREATININE 1.12 08/09/2022   BUN 13 08/09/2022   CO2  27 08/09/2022   TSH 1.72 06/23/2022   INR 1.8 (H) 11/22/2020   HGBA1C 6.5 08/09/2022    No results found.     Assessment & Plan:  Hypercholesterolemia Assessment & Plan: On simvastatin.  Low cholesterol diet and exercise.  Follow lipid panel and liver function tests.    Orders: -     Lipid panel  Hyperglycemia Assessment & Plan: Follow met b and a1c.   Orders: -     Hemoglobin A1c  Primary hypertension Assessment & Plan: On diltiazem and lasix.  Blood pressures doing well.  Follow.     Orders: -      Basic metabolic panel  Health care maintenance Assessment & Plan: Physical today 08/09/22.  Mammogram 01/14/15 - birads I.  Colonoscopy overdue.     Atrial fibrillation, unspecified type [facility]) Assessment & Plan: History of afib.  On diltiazem and metoprolol.  Followed by cardiology.  Off eliquis.  Per Dr Bethanne Ginger note, off due to increased bleeding risk.  Follow.    Anemia, unspecified type Assessment & Plan: Follow [facility].    Chronic venous insufficiency Assessment & Plan: Has chronic venous insufficiency. Hose help.  Has seen [facility].    Stage 3a chronic kidney disease ([place]) Assessment & Plan: Avoid antiinflammatories.  Stay hydrated.  Follow met b.    Fatty liver Assessment & Plan: Has been followed by GI.  Follow liver function tests.    Gastroesophageal reflux disease, unspecified whether esophagitis present Assessment & Plan: Controlled on prilosec.    History of colonic polyps Assessment & Plan: Has f/u planned with GI 07/2022.    Rheumatoid arthritis, involving unspecified site, unspecified whether rheumatoid factor present [facility]) Assessment & Plan: Followed by rheumatology.  On remicade. Follow.  Stable.    Abdominal pain, unspecified abdominal location Assessment & Plan: Center abdominal pain and fullness.  Noticed pain with palpation.  Discussed further w/up and scanning.  Notify me when/if agreeable.        [name], MD

## 2022-08-09 NOTE — Assessment & Plan Note (Addendum)
Physical today 08/09/22.  Mammogram 01/14/15 - birads I.  Colonoscopy overdue.

## 2022-08-10 LAB — LIPID PANEL
Cholesterol: 154 mg/dL (ref 0–200)
HDL: 97.7 mg/dL (ref >39.00)
LDL Cholesterol: 43 mg/dL (ref 0–99)
NonHDL: 56.59
Total CHOL/HDL Ratio: 2
Triglycerides: 68 mg/dL (ref 0.0–149.0)
VLDL: 13.6 mg/dL (ref 0.0–40.0)

## 2022-08-10 LAB — BASIC METABOLIC PANEL
BUN: 13 mg/dL (ref 6–23)
CO2: 27 mEq/L (ref 19–32)
Calcium: 9.6 mg/dL (ref 8.4–10.5)
Chloride: 97 mEq/L (ref 96–112)
Creatinine, Ser: 1.12 mg/dL (ref 0.40–1.20)
GFR: 46.84 mL/min — ABNORMAL LOW (ref >60.00)
Glucose, Bld: 117 mg/dL — ABNORMAL HIGH (ref 70–99)
Potassium: 4.2 mEq/L (ref 3.5–5.1)
Sodium: 133 mEq/L — ABNORMAL LOW (ref 135–145)

## 2022-08-10 LAB — HEMOGLOBIN A1C: Hgb A1c MFr Bld: 6.5 % (ref 4.6–6.5)

## 2022-08-11 ENCOUNTER — Telehealth: Payer: Self-pay

## 2022-08-11 DIAGNOSIS — R198 Other specified symptoms and signs involving the digestive system and abdomen: Secondary | ICD-10-CM

## 2022-08-11 DIAGNOSIS — R109 Unspecified abdominal pain: Secondary | ICD-10-CM

## 2022-08-11 NOTE — Telephone Encounter (Signed)
Attempted to call pt, no ans, vm full   [name], MD  [facility] Notify - sodium level is relatively stable.  Cholesterol levels have improved and look good. Kidney function slightly decreased from last.  We will follow.  Overall sugar control increased some from last check.  Will follow.

## 2022-08-14 ENCOUNTER — Telehealth: Payer: Self-pay | Admitting: Internal Medicine

## 2022-08-14 ENCOUNTER — Encounter: Payer: Self-pay | Admitting: Internal Medicine

## 2022-08-14 DIAGNOSIS — R109 Unspecified abdominal pain: Secondary | ICD-10-CM | POA: Insufficient documentation

## 2022-08-14 NOTE — Assessment & Plan Note (Signed)
Center abdominal pain and fullness.  Noticed pain with palpation.  Discussed further w/up and scanning.  Notify me when/if agreeable.

## 2022-08-14 NOTE — Assessment & Plan Note (Signed)
On diltiazem and lasix.  Blood pressures doing well.  Follow.

## 2022-08-14 NOTE — Assessment & Plan Note (Signed)
Has chronic venous insufficiency. Hose help.  Has seen [facility].

## 2022-08-14 NOTE — Assessment & Plan Note (Signed)
Follow cbc.  

## 2022-08-14 NOTE — Assessment & Plan Note (Signed)
On simvastatin.  Low cholesterol diet and exercise.  Follow lipid panel and liver function tests.   

## 2022-08-14 NOTE — Assessment & Plan Note (Signed)
Followed by rheumatology.  On remicade. Follow.  Stable.

## 2022-08-14 NOTE — Telephone Encounter (Signed)
She has been seeing Dr Ubaldo Glassing [facility]) - f/u afib.  He has retired.  She request to see Dr Saralyn Pilar.  Please call to arrange a f/u appt.  Not urgent.  Just needs to get established.

## 2022-08-14 NOTE — Assessment & Plan Note (Signed)
Has been followed by GI.  Follow liver function tests.

## 2022-08-14 NOTE — Assessment & Plan Note (Signed)
Has f/u planned with GI 07/2022.

## 2022-08-14 NOTE — Assessment & Plan Note (Signed)
Avoid antiinflammatories.  Stay hydrated.  Follow met b.   

## 2022-08-14 NOTE — Assessment & Plan Note (Signed)
History of afib.  On diltiazem and metoprolol.  Followed by cardiology.  Off eliquis.  Per Dr Bethanne Ginger note, off due to increased bleeding risk.  Follow.

## 2022-08-14 NOTE — Assessment & Plan Note (Signed)
Follow met b and a1c.  

## 2022-08-14 NOTE — Assessment & Plan Note (Signed)
Controlled on prilosec.   

## 2022-08-16 NOTE — Telephone Encounter (Signed)
Pt returned call. Note below was read to her. However, she mentioned about a ct scan of her stomach that needs to be done. She will like to f/u with that '@[phone]'$ [phone].

## 2022-08-17 ENCOUNTER — Telehealth: Payer: Self-pay | Admitting: Internal Medicine

## 2022-08-17 DIAGNOSIS — M0579 Rheumatoid arthritis with rheumatoid factor of multiple sites without organ or systems involvement: Secondary | ICD-10-CM | POA: Diagnosis not present

## 2022-08-17 NOTE — Telephone Encounter (Signed)
Forwarding to you to order CT, still trying to reach patient to discuss

## 2022-08-17 NOTE — Telephone Encounter (Signed)
LMTCB

## 2022-08-17 NOTE — Telephone Encounter (Signed)
LMTCB. Cardiology appt scheduled for 3/25 at 3pm with Dr Saralyn Pilar.

## 2022-08-17 NOTE — Telephone Encounter (Signed)
Lft pt vm to call ofc . thanks 

## 2022-08-17 NOTE — Telephone Encounter (Signed)
Order placed for CT scan.  Also, see if agreeable for mammogram.  Overdue.

## 2022-08-17 NOTE — Addendum Note (Signed)
Addended by: Alisa Graff on: 08/17/2022 03:33 PM   Modules accepted: Orders

## 2022-08-18 NOTE — Telephone Encounter (Signed)
L/M FOR PT. TO C/B.

## 2022-08-18 NOTE — Telephone Encounter (Signed)
3 unsuccessful calls - letter sent

## 2022-08-18 NOTE — Telephone Encounter (Signed)
Noted  

## 2022-08-19 ENCOUNTER — Telehealth: Payer: Self-pay | Admitting: Internal Medicine

## 2022-08-19 NOTE — Telephone Encounter (Signed)
Left message for patient to call back and schedule Medicare Annual Wellness Visit (AWV.   Please offer to do by telephone.  Left office number and my jabber [phone].  Last AWV:08/17/2021   Please schedule at anytime with Nurse Health Advisor.

## 2022-08-30 ENCOUNTER — Ambulatory Visit
Admission: RE | Admit: 2022-08-30 | Discharge: 2022-08-30 | Disposition: A | Discharge status: Home / Self Care | Payer: Medicare Other | Source: Ambulatory Visit | Attending: Internal Medicine | Admitting: Internal Medicine

## 2022-08-30 DIAGNOSIS — R198 Other specified symptoms and signs involving the digestive system and abdomen: Secondary | ICD-10-CM | POA: Diagnosis not present

## 2022-08-30 DIAGNOSIS — I700 Atherosclerosis of aorta: Secondary | ICD-10-CM | POA: Diagnosis not present

## 2022-08-30 DIAGNOSIS — R109 Unspecified abdominal pain: Secondary | ICD-10-CM | POA: Diagnosis not present

## 2022-08-31 ENCOUNTER — Telehealth: Payer: Self-pay | Admitting: Internal Medicine

## 2022-08-31 NOTE — Telephone Encounter (Signed)
Pt daughter called in asking for pt lab results. Note under labs was read to her. She's aware. She still wants to F/U with Dr. Nicki Reaper assistant. She's available @[phone]$ [phone].

## 2022-09-01 ENCOUNTER — Telehealth: Payer: Self-pay

## 2022-09-01 NOTE — Telephone Encounter (Signed)
-----   Message from [name], MD sent at 09/01/2022  4:37 AM EST ----- Please call and notify - CT scan reveals no acute abnormality.  She has diverticulosis, but no diverticulitis. Confirm doing ok.  If persistent abdominal pain, needs f/u with GI.

## 2022-09-01 NOTE — Telephone Encounter (Signed)
LMTCB in regards to lab results.  

## 2022-09-01 NOTE — Telephone Encounter (Signed)
LM for patient to return call. Need to confirm ok to speak with daughter Horris Latino. Not on DPR

## 2022-09-02 ENCOUNTER — Ambulatory Visit (INDEPENDENT_AMBULATORY_CARE_PROVIDER_SITE_OTHER): Payer: Medicare Other

## 2022-09-02 VITALS — Ht 65.0 in | Wt 125.0 lb

## 2022-09-02 DIAGNOSIS — Z0000 Encounter for general adult medical examination without abnormal findings: Secondary | ICD-10-CM | POA: Diagnosis not present

## 2022-09-02 NOTE — Patient Instructions (Addendum)
Ms. Kaitlyn Huff , Thank you for taking time to come for your Medicare Wellness Visit. I appreciate your ongoing commitment to your health goals. Please review the following plan we discussed and let me know if I can assist you in the future.   These are the goals we discussed:  Goals       Patient Stated     Healthy Lifestyle (pt-stated)      Stay active; stretch.         This is a list of the screening recommended for you and due dates:  Health Maintenance  Topic Date Due   Flu Shot  10/09/2022*   Zoster (Shingles) Vaccine (1 of 2) 11/08/2022*   Medicare Annual Wellness Visit  09/03/2023   Pneumonia Vaccine  Completed   DEXA scan (bone density measurement)  Completed   Hepatitis C Screening: USPSTF Recommendation to screen - Ages 2-79 yo.  Completed   HPV Vaccine  Aged Out   DTaP/Tdap/Td vaccine  Discontinued   Colon Cancer Screening  Discontinued   COVID-19 Vaccine  Discontinued  *Topic was postponed. The date shown is not the original due date.    Advanced directives: on file  Conditions/risks identified: none new  Next appointment: Follow up in one year for your annual wellness visit    Preventive Care 65 Years and Older, Female Preventive care refers to lifestyle choices and visits with your health care provider that can promote health and wellness. What does preventive care include? A yearly physical exam. This is also called an annual well check. Dental exams once or twice a year. Routine eye exams. Ask your health care provider how often you should have your eyes checked. Personal lifestyle choices, including: Daily care of your teeth and gums. Regular physical activity. Eating a healthy diet. Avoiding tobacco and drug use. Limiting alcohol use. Practicing safe sex. Taking low-dose aspirin every day. Taking vitamin and mineral supplements as recommended by your health care provider. What happens during an annual well check? The services and screenings done by  your health care provider during your annual well check will depend on your age, overall health, lifestyle risk factors, and family history of disease. Counseling  Your health care provider may ask you questions about your: Alcohol use. Tobacco use. Drug use. Emotional well-being. Home and relationship well-being. Sexual activity. Eating habits. History of falls. Memory and ability to understand (cognition). Work and work Statistician. Reproductive health. Screening  You may have the following tests or measurements: Height, weight, and BMI. Blood pressure. Lipid and cholesterol levels. These may be checked every 5 years, or more frequently if you are over 35 years old. Skin check. Lung cancer screening. You may have this screening every year starting at age 47 if you have a 30-pack-year history of smoking and currently smoke or have quit within the past 15 years. Fecal occult blood test (FOBT) of the stool. You may have this test every year starting at age 27. Flexible sigmoidoscopy or colonoscopy. You may have a sigmoidoscopy every 5 years or a colonoscopy every 10 years starting at age 16. Hepatitis C blood test. Hepatitis B blood test. Sexually transmitted disease (STD) testing. Diabetes screening. This is done by checking your blood sugar (glucose) after you have not eaten for a while (fasting). You may have this done every 1-3 years. Bone density scan. This is done to screen for osteoporosis. You may have this done starting at age 66. Mammogram. This may be done every 1-2 years. Talk to  your health care provider about how often you should have regular mammograms. Talk with your health care provider about your test results, treatment options, and if necessary, the need for more tests. Vaccines  Your health care provider may recommend certain vaccines, such as: Influenza vaccine. This is recommended every year. Tetanus, diphtheria, and acellular pertussis (Tdap, Td) vaccine. You  may need a Td booster every 10 years. Zoster vaccine. You may need this after age 9. Pneumococcal 13-valent conjugate (PCV13) vaccine. One dose is recommended after age 50. Pneumococcal polysaccharide (PPSV23) vaccine. One dose is recommended after age 42. Talk to your health care provider about which screenings and vaccines you need and how often you need them. This information is not intended to replace advice given to you by your health care provider. Make sure you discuss any questions you have with your health care provider. Document Released: 07/24/2015 Document Revised: 03/16/2016 Document Reviewed: 04/28/2015 Elsevier Interactive Patient Education  2017 [employer] Prevention in the Home Falls can cause injuries. They can happen to people of all ages. There are many things you can do to make your home safe and to help prevent falls. What can I do on the outside of my home? Regularly fix the edges of walkways and driveways and fix any cracks. Remove anything that might make you trip as you walk through a door, such as a raised step or threshold. Trim any bushes or trees on the path to your home. Use bright outdoor lighting. Clear any walking paths of anything that might make someone trip, such as rocks or tools. Regularly check to see if handrails are loose or broken. Make sure that both sides of any steps have handrails. Any raised decks and porches should have guardrails on the edges. Have any leaves, snow, or ice cleared regularly. Use sand or salt on walking paths during winter. Clean up any spills in your garage right away. This includes oil or grease spills. What can I do in the bathroom? Use night lights. Install grab bars by the toilet and in the tub and shower. Do not use towel bars as grab bars. Use non-skid mats or decals in the tub or shower. If you need to sit down in the shower, use a plastic, non-slip stool. Keep the floor dry. Clean up any water that spills  on the floor as soon as it happens. Remove soap buildup in the tub or shower regularly. Attach bath mats securely with double-sided non-slip rug tape. Do not have throw rugs and other things on the floor that can make you trip. What can I do in the bedroom? Use night lights. Make sure that you have a light by your bed that is easy to reach. Do not use any sheets or blankets that are too big for your bed. They should not hang down onto the floor. Have a firm chair that has side arms. You can use this for support while you get dressed. Do not have throw rugs and other things on the floor that can make you trip. What can I do in the kitchen? Clean up any spills right away. Avoid walking on wet floors. Keep items that you use a lot in easy-to-reach places. If you need to reach something above you, use a strong step stool that has a grab bar. Keep electrical cords out of the way. Do not use floor polish or wax that makes floors slippery. If you must use wax, use non-skid floor wax. Do not  have throw rugs and other things on the floor that can make you trip. What can I do with my stairs? Do not leave any items on the stairs. Make sure that there are handrails on both sides of the stairs and use them. Fix handrails that are broken or loose. Make sure that handrails are as long as the stairways. Check any carpeting to make sure that it is firmly attached to the stairs. Fix any carpet that is loose or worn. Avoid having throw rugs at the top or bottom of the stairs. If you do have throw rugs, attach them to the floor with carpet tape. Make sure that you have a light switch at the top of the stairs and the bottom of the stairs. If you do not have them, ask someone to add them for you. What else can I do to help prevent falls? Wear shoes that: Do not have high heels. Have rubber bottoms. Are comfortable and fit you well. Are closed at the toe. Do not wear sandals. If you use a stepladder: Make  sure that it is fully opened. Do not climb a closed stepladder. Make sure that both sides of the stepladder are locked into place. Ask someone to hold it for you, if possible. Clearly mark and make sure that you can see: Any grab bars or handrails. First and last steps. Where the edge of each step is. Use tools that help you move around (mobility aids) if they are needed. These include: Canes. Walkers. Scooters. Crutches. Turn on the lights when you go into a dark area. Replace any light bulbs as soon as they burn out. Set up your furniture so you have a clear path. Avoid moving your furniture around. If any of your floors are uneven, fix them. If there are any pets around you, be aware of where they are. Review your medicines with your doctor. Some medicines can make you feel dizzy. This can increase your chance of falling. Ask your doctor what other things that you can do to help prevent falls. This information is not intended to replace advice given to you by your health care provider. Make sure you discuss any questions you have with your health care provider. Document Released: 04/23/2009 Document Revised: 12/03/2015 Document Reviewed: 08/01/2014 Elsevier Interactive Patient Education  2017 [employer].

## 2022-09-02 NOTE — Progress Notes (Signed)
Subjective:   Kaitlyn Huff is a 82 y.o. female who presents for Medicare Annual (Subsequent) preventive examination.  Review of Systems    No ROS.  Medicare Wellness Virtual Visit.  Visual/audio telehealth visit, UTA vital signs.   See social history for additional risk factors.   Cardiac Risk Factors include: advanced age (>30mn, >>51women)     Objective:    Today's Vitals   09/02/22 1433  Weight: 125 lb (56.7 kg)  Height: '5\' 5"'$  (1.651 m)   Body mass index is 20.8 kg/m.     09/02/2022    2:34 PM 04/15/2022    1:38 AM 08/17/2021   11:10 AM 06/21/2021   12:56 PM 11/22/2020   10:00 PM 08/14/2020   10:42 AM 08/08/2019   11:41 AM  Advanced Directives  Does Patient Have a Medical Advance Directive? Yes Yes Yes No No Yes Yes  Type of AParamedicof [place]Living will Living will [place]Living will   [place]elloLiving will [place]Living will  Does patient want to make changes to medical advance directive? No - Patient declined No - Patient declined No - Patient declined   No - Patient declined No - Patient declined  Copy of H[place]ountvillein Chart? Yes - validated most recent copy scanned in chart (See row information)  Yes - validated most recent copy scanned in chart (See row information)   Yes - validated most recent copy scanned in chart (See row information) Yes - validated most recent copy scanned in chart (See row information)  Would patient like information on creating a medical advance directive?     No - Patient declined      Current Medications (verified) Outpatient Encounter Medications as of 09/02/2022  Medication Sig   albuterol (VENTOLIN HFA) 108 (90 Base) MCG/ACT inhaler USE 2 INHALATIONS EVERY 6 HOURS AS NEEDED FOR WHEEZING OR SHORTNESS OF BREATH   alendronate (FOSAMAX) 70 MG tablet Take 70 mg by mouth every Monday.    ALPRAZolam (XANAX) 0.25 MG tablet Take 1 tablet  (0.25 mg total) by mouth 2 (two) times daily as needed for anxiety.   cholecalciferol (VITAMIN D) 1000 units tablet Take 1,000 Units by mouth daily.   colestipol (COLESTID) 1 g tablet Take 2 g by mouth daily.   diltiazem (CARDIZEM CD) 180 MG 24 hr capsule Take 180 mg by mouth daily.   fluticasone (FLONASE) 50 MCG/ACT nasal spray USE 2 SPRAYS IN EACH NOSTRIL DAILY (CALL OFFICE SOON TO RESCHEDULE YOUR PHYSICAL)   furosemide (LASIX) 40 MG tablet Take 1 tablet by mouth daily.   gabapentin (NEURONTIN) 100 MG capsule Take 100 mg by mouth daily.   lidocaine (LIDODERM) 5 % Place 1 patch onto the skin every 12 (twelve) hours. Remove & Discard patch within 12 hours or as directed by MD   lip balm (BLISTEX) OINT Apply 1 application topically as needed for lip care.   Multiple Vitamin (MULTIVITAMIN WITH MINERALS) TABS tablet Take 1 tablet by mouth daily.   omeprazole (PRILOSEC) 20 MG capsule Take 1 capsule by mouth daily.   predniSONE (DELTASONE) 5 MG tablet Take 5 mg by mouth daily.   Probiotic Product (ALIGN PO) Take 1 tablet by mouth daily.   spironolactone (ALDACTONE) 25 MG tablet Take 12.5 mg by mouth daily.   tiZANidine (ZANAFLEX) 4 MG tablet Take by mouth.   traMADol (ULTRAM) 50 MG tablet Take 50 mg by mouth 2 (two) times  daily as needed.   No facility-administered encounter medications on file as of 09/02/2022.    Allergies (verified) Astelin [azelastine hcl], Codeine, Dilaudid [hydromorphone], Flexeril [cyclobenzaprine], Imuran [azathioprine], Lisinopril, Lyrica [pregabalin], Methotrexate derivatives, Amiodarone, Amoxicillin, Arava [leflunomide], Clindamycin/lincomycin, Doxycycline, Lodine [etodolac], Percocet [oxycodone-acetaminophen], and Sulfa antibiotics   History: Past Medical History:  Diagnosis Date   Anemia    Cancer ([place])    Diverticulitis    GERD (gastroesophageal reflux disease)    Hyperlipidemia    Hypertension    Osteoarthritis    Osteoporosis    actonel   Positive PPD     s/p INH (2006)   Rheumatoid arthritis(714.0)    MTX transaminitis, Leflunomide (rash), enbrel, plaquinil, prednisone, remicade, Imuran (transaminitis)   Valvular heart disease    moderate MR and TR   Past Surgical History:  Procedure Laterality Date   ABDOMINAL HYSTERECTOMY     CERVICAL CONE BIOPSY     CHOLECYSTECTOMY  06/22/14   COLONOSCOPY WITH PROPOFOL N/A 07/09/2015   Procedure: COLONOSCOPY WITH PROPOFOL;  Surgeon: Manya Silvas, MD;  Location: [facility] ENDOSCOPY;  Service: Endoscopy;  Laterality: N/A;   ESOPHAGOGASTRODUODENOSCOPY (EGD) WITH PROPOFOL N/A 07/09/2015   Procedure: ESOPHAGOGASTRODUODENOSCOPY (EGD) WITH PROPOFOL;  Surgeon: Manya Silvas, MD;  Location: [facility] ENDOSCOPY;  Service: Endoscopy;  Laterality: N/A;   OVARY SURGERY     TRACHEOSTOMY  1959   TUBAL LIGATION     Family History  Problem Relation Age of Onset   Heart disease Father        MI   Heart disease Mother    Valvular heart disease Mother    Breast cancer Sister 79   Cancer Sister        Lung cancer   COPD Sister    Heart disease Sister    Colon cancer Neg Hx    Social History   Socioeconomic History   Marital status: Widowed    Spouse name: Not on file   Number of children: 4   Years of education: Not on file   Highest education level: Not on file  Occupational History   Occupation: retired  Tobacco Use   Smoking status: Never   Smokeless tobacco: Never  Substance and Sexual Activity   Alcohol use: No    Alcohol/week: 0.0 standard drinks of alcohol   Drug use: No   Sexual activity: Never  Other Topics Concern   Not on file  Social History Narrative   Lives with family at home   Social Determinants of Health   Financial Resource Strain: Low Risk  (09/02/2022)   Overall Financial Resource Strain (CARDIA)    Difficulty of Paying Living Expenses: Not hard at all  Food Insecurity: No Food Insecurity (09/02/2022)   Hunger Vital Sign    Worried About Running Out of Food in the Last  Year: Never true    [place] in the Last Year: Never true  Transportation Needs: No Transportation Needs (09/02/2022)   PRAPARE - Hydrologist (Medical): No    Lack of Transportation (Non-Medical): No  Physical Activity: Unknown (09/02/2022)   Exercise Vital Sign    Days of Exercise per Week: 0 days    Minutes of Exercise per Session: Not on file  Stress: No Stress Concern Present (09/02/2022)   [place]    Feeling of Stress : Not at all  Social Connections: Unknown (09/02/2022)   Social Connection and Isolation Panel [NHANES]  Frequency of Communication with Friends and Family: More than three times a week    Frequency of Social Gatherings with Friends and Family: More than three times a week    Attends Religious Services: Not on Advertising copywriter or Organizations: Yes    Attends Archivist Meetings: Not on file    Marital Status: Not on file    Tobacco Counseling Counseling given: Not Answered   Clinical Intake:  Pre-visit preparation completed: Yes        Diabetes: No  How often do you need to have someone help you when you read instructions, pamphlets, or other written materials from your doctor or pharmacy?: 1 - Never   Interpreter Needed?: No      Activities of Daily Living    09/02/2022    2:42 PM  In your present state of health, do you have any difficulty performing the following activities:  Hearing? 0  Vision? 0  Difficulty concentrating or making decisions? 0  Walking or climbing stairs? 1  Dressing or bathing? 0  Doing errands, shopping? 0  Preparing Food and eating ? N  Using the Toilet? N  In the past six months, have you accidently leaked urine? N  Do you have problems with loss of bowel control? N  Managing your Medications? N  Managing your Finances? N  Housekeeping or managing your Housekeeping? N    Patient Care  Team: [name], MD as PCP - General (Internal Medicine)  Indicate any recent Medical Services you may have received from other than [facility] providers in the past year (date may be approximate).     Assessment:   This is a routine wellness examination for [place].  I connected with  Yanixa Ingram Meloy on 09/02/22 by a audio enabled telemedicine application and verified that I am speaking with the correct person using two identifiers.  Patient Location: Home  Provider Location: Office/Clinic  I discussed the limitations of evaluation and management by telemedicine. The patient expressed understanding and agreed to proceed.   Hearing/Vision screen Hearing Screening - Comments::  Patient is able to hear conversational tones without difficulty. No issues reported. Vision Screening - Comments:: Followed by Dr. Gwynn Burly Wears corrective lenses  Annual visits  Cataract extraction, bilateral They have regular follow up with the ophthalmologist    Dietary issues and exercise activities discussed: Current Exercise Habits: Home exercise routine Healthy diet Good water intake   Goals Addressed               This Visit's Progress     Patient Stated     Healthy Lifestyle (pt-stated)        Stay active; stretch.        Depression Screen    09/02/2022    2:41 PM 05/06/2022    1:19 PM 03/28/2022    2:05 PM 08/17/2021   10:45 AM 07/06/2021    2:51 PM 08/14/2020   10:39 AM 10/24/2019    2:12 PM  PHQ 2/9 Scores  PHQ - 2 Score 0 0 2 0 0 0 0  PHQ- 9 Score  0 8        Fall Risk    09/02/2022    2:41 PM 08/09/2022    1:58 PM 05/06/2022    1:19 PM 03/28/2022    2:05 PM 08/17/2021   11:11 AM  Fall Risk   Falls in the past year? 0 0 0 0 0  Number falls in past  yr: 0 0 0  0  Injury with Fall? 0 0 0    Risk for fall due to :  No Fall Risks No Fall Risks No Fall Risks   Follow up Falls evaluation completed;Falls prevention discussed Falls evaluation completed Falls evaluation completed  Falls evaluation completed Falls evaluation completed    FALL RISK PREVENTION PERTAINING TO THE HOME: Home free of loose throw rugs in walkways, pet beds, electrical cords, etc? Yes  Adequate lighting in your home to reduce risk of falls? Yes   ASSISTIVE DEVICES UTILIZED TO PREVENT FALLS: Life alert? No  Use of a cane, walker or w/c? Yes , walker as needed Grab bars in the bathroom? No  Shower chair or bench in shower? Yes  Elevated toilet seat or a handicapped toilet? No   TIMED UP AND GO: Was the test performed? No .   Cognitive Function:    05/12/2016    1:45 PM  MMSE - Mini Mental State Exam  Orientation to time 5  Orientation to Place 5  Registration 3  Attention/ Calculation 5  Recall 3  Language- name 2 objects 2  Language- repeat 1  Language- follow 3 step command 3  Language- read & follow direction 1  Write a sentence 1  Copy design 1  Total score 30        09/02/2022    2:43 PM 08/14/2020   10:50 AM 08/08/2019   11:49 AM 08/03/2018   12:25 PM 07/14/2017   10:36 AM  6CIT Screen  What Year? 0 points 0 points 0 points 0 points 0 points  What month? 0 points 0 points 0 points 0 points 0 points  What time? 0 points 0 points 0 points 0 points 0 points  Count back from 20 0 points 0 points 0 points 0 points 0 points  Months in reverse 0 points 0 points 0 points 0 points 0 points  Repeat phrase 0 points  0 points 0 points 0 points  Total Score 0 points  0 points 0 points 0 points    Immunizations Immunization History  Administered Date(s) Administered   Fluad Quad(high Dose 65+) 04/05/2019   Influenza Split 05/17/2012, 03/03/2014   Influenza, High Dose Seasonal PF 02/25/2017, 03/29/2018   Influenza,inj,Quad PF,6+ Mos 03/25/2013, 08/05/2015   Influenza-Unspecified 03/25/2013   Pneumococcal Conjugate-13 05/21/2018   Pneumococcal Polysaccharide-23 02/24/2017   Screening Tests Health Maintenance  Topic Date Due   INFLUENZA VACCINE  10/09/2022 (Originally  02/08/2022)   Zoster Vaccines- Shingrix (1 of 2) 11/08/2022 (Originally 04/28/1960)   Medicare Annual Wellness (AWV)  09/03/2023   Pneumonia Vaccine 62+ Years old  Completed   DEXA SCAN  Completed   Hepatitis C Screening  Completed   HPV VACCINES  Aged Out   DTaP/Tdap/Td  Discontinued   COLONOSCOPY (Pts 45-20yr Insurance coverage will need to be confirmed)  Discontinued   COVID-19 Vaccine  Discontinued    Health Maintenance There are no preventive care reminders to display for this patient.  Lung Cancer Screening: (Low Dose CT Chest recommended if Age 80-80years, 30 pack-year currently smoking OR have quit w/in 15years.) does not qualify.   Hepatitis C Screening: does not qualify.  Vision Screening: Recommended annual ophthalmology exams for early detection of glaucoma and other disorders of the eye.  Dental Screening: Recommended annual dental exams for proper oral hygiene.  Community Resource Referral / Chronic Care Management: CRR required this visit?  No   CCM required this visit?  No  Plan:     I have personally reviewed and noted the following in the patient's chart:   Medical and social history Use of alcohol, tobacco or illicit drugs  Current medications and supplements including opioid prescriptions. Patient is currently taking opioid prescriptions. Information provided to patient regarding non-opioid alternatives. Patient advised to discuss non-opioid treatment plan with their provider. Followed by pcp. Functional ability and status Nutritional status Physical activity Advanced directives List of other physicians Hospitalizations, surgeries, and ER visits in previous 12 months Vitals Screenings to include cognitive, depression, and falls Referrals and appointments  In addition, I have reviewed and discussed with patient certain preventive protocols, quality metrics, and best practice recommendations. A written personalized care plan for preventive  services as well as general preventive health recommendations were provided to patient.     Leta Jungling, LPN   [phone]

## 2022-09-05 ENCOUNTER — Other Ambulatory Visit: Payer: Self-pay | Admitting: Internal Medicine

## 2022-09-07 NOTE — Telephone Encounter (Signed)
Prescription Request  09/07/2022   LOV: 08/09/2022  What is the name of the medication or equipment?  Cetirizine HCl 10 MG   Have you contacted your pharmacy to request a refill? Yes   Which pharmacy would you like this sent to?   [facility] [place], [address] [address] Phone: [phone] Fax: [phone]    Patient notified that their request is being sent to the clinical staff for review and that they should receive a response within 2 business days.   Please advise at [facility] [phone]

## 2022-10-04 DIAGNOSIS — R1031 Right lower quadrant pain: Secondary | ICD-10-CM | POA: Diagnosis not present

## 2022-10-04 DIAGNOSIS — D649 Anemia, unspecified: Secondary | ICD-10-CM | POA: Diagnosis not present

## 2022-10-06 ENCOUNTER — Other Ambulatory Visit: Payer: Self-pay | Admitting: Internal Medicine

## 2022-10-10 NOTE — Telephone Encounter (Signed)
Rx sent in already.  Will d/w her at her appt regarding dose and adjusting dosing.

## 2022-10-10 NOTE — Telephone Encounter (Signed)
Requesting: Alprazolam Contract: No UDS: NO Last Visit: 08/09/2022 Next Visit: 11/08/2022 Last Refill: 06/28/2022  62 no refills  Please Advise

## 2022-10-12 DIAGNOSIS — R1031 Right lower quadrant pain: Secondary | ICD-10-CM | POA: Diagnosis not present

## 2022-10-12 DIAGNOSIS — M0579 Rheumatoid arthritis with rheumatoid factor of multiple sites without organ or systems involvement: Secondary | ICD-10-CM | POA: Diagnosis not present

## 2022-11-07 DIAGNOSIS — J329 Chronic sinusitis, unspecified: Secondary | ICD-10-CM | POA: Diagnosis not present

## 2022-11-07 DIAGNOSIS — M0579 Rheumatoid arthritis with rheumatoid factor of multiple sites without organ or systems involvement: Secondary | ICD-10-CM | POA: Diagnosis not present

## 2022-11-08 ENCOUNTER — Ambulatory Visit (INDEPENDENT_AMBULATORY_CARE_PROVIDER_SITE_OTHER): Payer: Medicare Other | Admitting: Internal Medicine

## 2022-11-08 ENCOUNTER — Encounter: Payer: Self-pay | Admitting: Internal Medicine

## 2022-11-08 VITALS — BP 120/70 | HR 60 | Temp 97.8°F | Resp 16 | Ht 65.0 in | Wt 120.6 lb

## 2022-11-08 DIAGNOSIS — K219 Gastro-esophageal reflux disease without esophagitis: Secondary | ICD-10-CM

## 2022-11-08 DIAGNOSIS — G2581 Restless legs syndrome: Secondary | ICD-10-CM

## 2022-11-08 DIAGNOSIS — R5383 Other fatigue: Secondary | ICD-10-CM | POA: Diagnosis not present

## 2022-11-08 DIAGNOSIS — I872 Venous insufficiency (chronic) (peripheral): Secondary | ICD-10-CM

## 2022-11-08 DIAGNOSIS — E7800 Pure hypercholesterolemia, unspecified: Secondary | ICD-10-CM | POA: Diagnosis not present

## 2022-11-08 DIAGNOSIS — D649 Anemia, unspecified: Secondary | ICD-10-CM

## 2022-11-08 DIAGNOSIS — M069 Rheumatoid arthritis, unspecified: Secondary | ICD-10-CM

## 2022-11-08 DIAGNOSIS — I10 Essential (primary) hypertension: Secondary | ICD-10-CM | POA: Diagnosis not present

## 2022-11-08 DIAGNOSIS — I4891 Unspecified atrial fibrillation: Secondary | ICD-10-CM | POA: Diagnosis not present

## 2022-11-08 DIAGNOSIS — K760 Fatty (change of) liver, not elsewhere classified: Secondary | ICD-10-CM | POA: Diagnosis not present

## 2022-11-08 DIAGNOSIS — R739 Hyperglycemia, unspecified: Secondary | ICD-10-CM | POA: Diagnosis not present

## 2022-11-08 DIAGNOSIS — N1831 Chronic kidney disease, stage 3a: Secondary | ICD-10-CM

## 2022-11-08 DIAGNOSIS — E871 Hypo-osmolality and hyponatremia: Secondary | ICD-10-CM

## 2022-11-08 MED ORDER — CETIRIZINE HCL 10 MG PO TABS: 10.0000 mg | ORAL_TABLET | Freq: Every day | ORAL | 3 refills | Status: DC

## 2022-11-08 NOTE — Progress Notes (Unsigned)
Subjective:    Patient ID: Kaitlyn Huff, female    DOB: 1941/04/28, 82 y.o.   MRN: [id number]  Patient here for  Chief Complaint  Patient presents with   Medical Management of Chronic Issues    HPI Here to follow up regarding afib, hypercholesterolemia and RA.  Followed by rheumatology for RA. On remicade.  Feels stable.  No chest pain or sob.  Does report some fatigue.  No increased cough or congestion.  No abdominal pain reported.  Bowels ok.     Past Medical History:  Diagnosis Date   Anemia    Cancer (HCC)    Diverticulitis    GERD (gastroesophageal reflux disease)    Hyperlipidemia    Hypertension    Osteoarthritis    Osteoporosis    actonel   Positive PPD    s/p INH (2006)   Rheumatoid arthritis(714.0)    MTX transaminitis, Leflunomide (rash), enbrel, plaquinil, prednisone, remicade, Imuran (transaminitis)   Valvular heart disease    moderate MR and TR   Past Surgical History:  Procedure Laterality Date   ABDOMINAL HYSTERECTOMY     CERVICAL CONE BIOPSY     CHOLECYSTECTOMY  06/22/14   COLONOSCOPY WITH PROPOFOL N/A 07/09/2015   Procedure: COLONOSCOPY WITH PROPOFOL;  Surgeon: Scot Jun, MD;  Location: [facility] ENDOSCOPY;  Service: Endoscopy;  Laterality: N/A;   ESOPHAGOGASTRODUODENOSCOPY (EGD) WITH PROPOFOL N/A 07/09/2015   Procedure: ESOPHAGOGASTRODUODENOSCOPY (EGD) WITH PROPOFOL;  Surgeon: Scot Jun, MD;  Location: [facility] ENDOSCOPY;  Service: Endoscopy;  Laterality: N/A;   OVARY SURGERY     TRACHEOSTOMY  1959   TUBAL LIGATION     Family History  Problem Relation Age of Onset   Heart disease Father        MI   Heart disease Mother    Valvular heart disease Mother    Breast cancer Sister 49   Cancer Sister        Lung cancer   COPD Sister    Heart disease Sister    Colon cancer Neg Hx    Social History   Socioeconomic History   Marital status: Widowed    Spouse name: Not on file   Number of children: 4   Years of education: Not on file    Highest education level: Not on file  Occupational History   Occupation: retired  Tobacco Use   Smoking status: Never   Smokeless tobacco: Never  Substance and Sexual Activity   Alcohol use: No    Alcohol/week: 0.0 standard drinks of alcohol   Drug use: No   Sexual activity: Never  Other Topics Concern   Not on file  Social History Narrative   Lives with family at home   Social Determinants of Health   Financial Resource Strain: Low Risk  (09/02/2022)   Overall Financial Resource Strain (CARDIA)    Difficulty of Paying Living Expenses: Not hard at all  Food Insecurity: No Food Insecurity (09/02/2022)   Hunger Vital Sign    Worried About Running Out of Food in the Last Year: Never true    Ran Out of Food in the Last Year: Never true  Transportation Needs: No Transportation Needs (09/02/2022)   PRAPARE - Administrator, Civil Service (Medical): No    Lack of Transportation (Non-Medical): No  Physical Activity: Unknown (09/02/2022)   Exercise Vital Sign    Days of Exercise per Week: 0 days    Minutes of Exercise per Session: Not on file  Stress: No Stress Concern Present (09/02/2022)   [employer] of Occupational Health - Occupational Stress Questionnaire    Feeling of Stress : Not at all  Social Connections: Unknown (09/02/2022)   Social Connection and Isolation Panel [NHANES]    Frequency of Communication with Friends and Family: More than three times a week    Frequency of Social Gatherings with Friends and Family: More than three times a week    Attends Religious Services: Not on Marketing executive or Organizations: Yes    Attends Banker Meetings: Not on file    Marital Status: Not on file     Review of Systems  Constitutional:  Positive for fatigue. Negative for appetite change and unexpected weight change.  HENT:  Negative for congestion and sinus pressure.   Respiratory:  Negative for cough and chest tightness.         Breathing stable.   Cardiovascular:  Negative for chest pain and palpitations.  Gastrointestinal:  Negative for abdominal pain, diarrhea, nausea and vomiting.  Genitourinary:  Negative for difficulty urinating and dysuria.  Musculoskeletal:  Negative for myalgias.  Skin:  Negative for color change and rash.  Neurological:  Negative for dizziness and headaches.  Psychiatric/Behavioral:  Negative for agitation and dysphoric mood.        Objective:     BP 120/70   Pulse 60   Temp 97.8 F (36.6 C)   Resp 16   Ht 5\' 5"  (1.651 m)   Wt 120 lb 9.6 oz (54.7 kg)   SpO2 97%   BMI 20.07 kg/m  Wt Readings from Last 3 Encounters:  11/08/22 120 lb 9.6 oz (54.7 kg)  09/02/22 125 lb (56.7 kg)  08/09/22 125 lb 12.8 oz (57.1 kg)   Not orthostatic on exam.   Physical Exam Vitals reviewed.  Constitutional:      General: She is not in acute distress.    Appearance: Normal appearance.  HENT:     Head: Normocephalic and atraumatic.     Right Ear: External ear normal.     Left Ear: External ear normal.  Eyes:     General: No scleral icterus.       Right eye: No discharge.        Left eye: No discharge.     Conjunctiva/sclera: Conjunctivae normal.  Neck:     Thyroid: No thyromegaly.  Cardiovascular:     Rate and Rhythm: Normal rate.  Pulmonary:     Effort: No respiratory distress.     Breath sounds: Normal breath sounds. No wheezing.  Abdominal:     General: Bowel sounds are normal.     Palpations: Abdomen is soft.     Tenderness: There is no abdominal tenderness.  Musculoskeletal:        General: No swelling or tenderness.     Cervical back: Neck supple. No tenderness.  Lymphadenopathy:     Cervical: No cervical adenopathy.  Skin:    Findings: No erythema or rash.  Neurological:     Mental Status: She is alert.  Psychiatric:        Mood and Affect: Mood normal.        Behavior: Behavior normal.      Outpatient Encounter Medications as of 11/08/2022  Medication Sig    atorvastatin (LIPITOR) 20 MG tablet Take 20 mg by mouth daily.   diltiazem (CARDIZEM) 30 MG tablet Take 30 mg by mouth daily as needed.   albuterol (VENTOLIN  HFA) 108 (90 Base) MCG/ACT inhaler USE 2 INHALATIONS EVERY 6 HOURS AS NEEDED FOR WHEEZING OR SHORTNESS OF BREATH   ALPRAZolam (XANAX) 0.25 MG tablet Take 1 tablet (0.25 mg total) by mouth 2 (two) times daily as needed for anxiety.   cetirizine (ZYRTEC) 10 MG tablet Take 1 tablet (10 mg total) by mouth daily.   colestipol (COLESTID) 1 g tablet Take 2 g by mouth daily.   diltiazem (CARDIZEM CD) 180 MG 24 hr capsule Take 180 mg by mouth daily.   fluticasone (FLONASE) 50 MCG/ACT nasal spray USE 2 SPRAYS IN EACH NOSTRIL DAILY (CALL OFFICE SOON TO RESCHEDULE YOUR PHYSICAL)   furosemide (LASIX) 40 MG tablet Take 1 tablet by mouth daily.   gabapentin (NEURONTIN) 100 MG capsule Take 100 mg by mouth daily.   lidocaine (LIDODERM) 5 % Place 1 patch onto the skin every 12 (twelve) hours. Remove & Discard patch within 12 hours or as directed by MD   lip balm (BLISTEX) OINT Apply 1 application topically as needed for lip care.   Multiple Vitamin (MULTIVITAMIN WITH MINERALS) TABS tablet Take 1 tablet by mouth daily.   omeprazole (PRILOSEC) 20 MG capsule Take 1 capsule by mouth daily.   predniSONE (DELTASONE) 5 MG tablet Take 5 mg by mouth daily.   Probiotic Product (ALIGN PO) Take 1 tablet by mouth daily.   spironolactone (ALDACTONE) 25 MG tablet Take 12.5 mg by mouth daily.   traMADol (ULTRAM) 50 MG tablet Take 50 mg by mouth 2 (two) times daily as needed.   [DISCONTINUED] alendronate (FOSAMAX) 70 MG tablet Take 70 mg by mouth every Monday.    [DISCONTINUED] cholecalciferol (VITAMIN D) 1000 units tablet Take 1,000 Units by mouth daily.   [DISCONTINUED] tiZANidine (ZANAFLEX) 4 MG tablet Take by mouth.   No facility-administered encounter medications on file as of 11/08/2022.     Lab Results  Component Value Date   WBC 7.5 11/08/2022   HGB 12.3  11/08/2022   HCT 35.4 (L) 11/08/2022   PLT 213.0 11/08/2022   GLUCOSE 121 (H) 11/08/2022   CHOL 130 11/08/2022   TRIG 58.0 11/08/2022   HDL 78.40 11/08/2022   LDLDIRECT 73.7 08/20/2013   LDLCALC 40 11/08/2022   ALT 19 11/08/2022   AST 34 11/08/2022   NA 129 (L) 11/11/2022   K 4.2 11/08/2022   CL 93 (L) 11/08/2022   CREATININE 1.07 11/08/2022   BUN 11 11/08/2022   CO2 25 11/08/2022   TSH 0.60 11/08/2022   INR 1.8 (H) 11/22/2020   HGBA1C 6.4 11/08/2022    CT Abdomen Pelvis Wo Contrast  Result Date: 08/31/2022 CLINICAL DATA:  Abdominal pain. EXAM: CT ABDOMEN AND PELVIS WITHOUT CONTRAST TECHNIQUE: Multidetector CT imaging of the abdomen and pelvis was performed following the standard protocol without IV contrast. RADIATION DOSE REDUCTION: This exam was performed according to the departmental dose-optimization program which includes automated exposure control, adjustment of the mA and/or kV according to patient size and/or use of iterative reconstruction technique. COMPARISON:  CT abdomen pelvis dated 06/24/2017. FINDINGS: Lower chest: The visualized lung bases are clear. There is coronary vascular calcification. No intra-abdominal free air or free fluid. Hepatobiliary: The liver is unremarkable. No biliary dilatation. Cholecystectomy. No retained calcified stone noted in the central CBD. Pancreas: Unremarkable. No pancreatic ductal dilatation or surrounding inflammatory changes. Spleen: Normal in size without focal abnormality. Adrenals/Urinary Tract: The adrenal glands are unremarkable. There is no hydronephrosis or nephrolithiasis on either side. The visualized ureters and urinary bladder appear unremarkable.  Stomach/Bowel: There is sigmoid diverticulosis without active inflammatory changes. There is moderate stool throughout the colon. There is a small hiatal hernia. There is no bowel obstruction or active inflammation. The appendix is normal. Vascular/Lymphatic: Mild aortoiliac  atherosclerotic disease. The IVC is unremarkable. No portal venous gas. There is no adenopathy. Reproductive: Hysterectomy.  No adnexal masses. Other: None Musculoskeletal: Osteopenia with degenerative changes of the spine. No acute osseous pathology. IMPRESSION: 1. No acute intra-abdominal or pelvic pathology. 2. Sigmoid diverticulosis. No bowel obstruction. Normal appendix. 3.  Aortic Atherosclerosis (ICD10-I70.0). Electronically Signed   By: Elgie Collard M.D.   On: 08/31/2022 22:29       Assessment & Plan:  Other fatigue Assessment & Plan: Reports increased fatigue.  Discussed exercise - regular.  Check routine labs as outlined. Further w/up pending results. No chest pain or sob.     Anemia, unspecified type Assessment & Plan: Follow cbc.   Orders: -     CBC with Differential/Platelet -     IBC + Ferritin -     Vitamin B12  Hyperglycemia Assessment & Plan: Follow met b and a1c.   Orders: -     Hemoglobin A1c  Hypercholesterolemia Assessment & Plan: On simvastatin.  Low cholesterol diet and exercise.  Follow lipid panel and liver function tests.    Orders: -     Hepatic function panel -     TSH -     Lipid panel  Primary hypertension Assessment & Plan: On diltiazem and lasix.  Blood pressures doing well.  Not orthostatic on exam.  Pressures.  Follow metabolic panel.     Orders: -     Basic metabolic panel  Atrial fibrillation, unspecified type [facility]) Assessment & Plan: History of afib.  On diltiazem and metoprolol.  Followed by cardiology.  Off eliquis.  Per Dr America Brown note, off due to increased bleeding risk.  Follow.    Chronic venous insufficiency Assessment & Plan: Has chronic venous insufficiency. Hose help.  Has seen [facility].    Stage 3a chronic kidney disease (HCC) Assessment & Plan: Avoid antiinflammatories.  Stay hydrated.  Follow met b.    Fatty liver Assessment & Plan: Has been followed by GI.  Follow liver function tests.    Gastroesophageal  reflux disease, unspecified whether esophagitis present Assessment & Plan: Controlled on prilosec.    Restless leg syndrome Assessment & Plan: On gabapentin.    Rheumatoid arthritis, involving unspecified site, unspecified whether rheumatoid factor present [facility]) Assessment & Plan: Followed by rheumatology.  On remicade. Follow.  Stable.    Hyponatremia Assessment & Plan: Recheck sodium today.  Eating.     Other orders -     Cetirizine HCl; Take 1 tablet (10 mg total) by mouth daily.  Dispense: 90 tablet; Refill: 3     Dale [facility], MD

## 2022-11-09 ENCOUNTER — Telehealth: Payer: Self-pay

## 2022-11-09 DIAGNOSIS — E871 Hypo-osmolality and hyponatremia: Secondary | ICD-10-CM

## 2022-11-09 LAB — LIPID PANEL
Cholesterol: 130 mg/dL (ref 0–200)
HDL: 78.4 mg/dL (ref >39.00)
LDL Cholesterol: 40 mg/dL (ref 0–99)
NonHDL: 51.88
Total CHOL/HDL Ratio: 2
Triglycerides: 58 mg/dL (ref 0.0–149.0)
VLDL: 11.6 mg/dL (ref 0.0–40.0)

## 2022-11-09 LAB — IBC + FERRITIN
Ferritin: 26.4 ng/mL (ref 10.0–291.0)
Iron: 65 ug/dL (ref 42–145)
Saturation Ratios: 16.3 % — ABNORMAL LOW (ref 20.0–50.0)
TIBC: 397.6 ug/dL (ref 250.0–450.0)
Transferrin: 284 mg/dL (ref 212.0–360.0)

## 2022-11-09 LAB — HEPATIC FUNCTION PANEL
ALT: 19 U/L (ref 0–35)
AST: 34 U/L (ref 0–37)
Albumin: 4.2 g/dL (ref 3.5–5.2)
Alkaline Phosphatase: 37 U/L — ABNORMAL LOW (ref 39–117)
Bilirubin, Direct: 0.3 mg/dL (ref 0.0–0.3)
Total Bilirubin: 0.9 mg/dL (ref 0.2–1.2)
Total Protein: 7 g/dL (ref 6.0–8.3)

## 2022-11-09 LAB — BASIC METABOLIC PANEL
BUN: 11 mg/dL (ref 6–23)
CO2: 25 mEq/L (ref 19–32)
Calcium: 9.2 mg/dL (ref 8.4–10.5)
Chloride: 93 mEq/L — ABNORMAL LOW (ref 96–112)
Creatinine, Ser: 1.07 mg/dL (ref 0.40–1.20)
GFR: 49.4 mL/min — ABNORMAL LOW (ref >60.00)
Glucose, Bld: 121 mg/dL — ABNORMAL HIGH (ref 70–99)
Potassium: 4.2 mEq/L (ref 3.5–5.1)
Sodium: 127 mEq/L — ABNORMAL LOW (ref 135–145)

## 2022-11-09 LAB — CBC WITH DIFFERENTIAL/PLATELET
Basophils Absolute: 0.1 10*3/uL (ref 0.0–0.1)
Basophils Relative: 0.8 % (ref 0.0–3.0)
Eosinophils Absolute: 0 10*3/uL (ref 0.0–0.7)
Eosinophils Relative: 0.1 % (ref 0.0–5.0)
HCT: 35.4 % — ABNORMAL LOW (ref 36.0–46.0)
Hemoglobin: 12.3 g/dL (ref 12.0–15.0)
Lymphocytes Relative: 17.3 % (ref 12.0–46.0)
Lymphs Abs: 1.3 10*3/uL (ref 0.7–4.0)
MCHC: 34.9 g/dL (ref 30.0–36.0)
MCV: 92.9 fl (ref 78.0–100.0)
Monocytes Absolute: 0.4 10*3/uL (ref 0.1–1.0)
Monocytes Relative: 5 % (ref 3.0–12.0)
Neutro Abs: 5.8 10*3/uL (ref 1.4–7.7)
Neutrophils Relative %: 76.8 % (ref 43.0–77.0)
Platelets: 213 10*3/uL (ref 150.0–400.0)
RBC: 3.81 Mil/uL — ABNORMAL LOW (ref 3.87–5.11)
RDW: 14.2 % (ref 11.5–15.5)
WBC: 7.5 10*3/uL (ref 4.0–10.5)

## 2022-11-09 LAB — HEMOGLOBIN A1C: Hgb A1c MFr Bld: 6.4 % (ref 4.6–6.5)

## 2022-11-09 LAB — VITAMIN B12: Vitamin B-12: 1309 pg/mL — ABNORMAL HIGH (ref 211–911)

## 2022-11-09 LAB — TSH: TSH: 0.6 u[IU]/mL (ref 0.35–5.50)

## 2022-11-09 NOTE — Addendum Note (Signed)
Addended by: Rita Ohara D on: 11/09/2022 01:52 PM   Modules accepted: Orders

## 2022-11-09 NOTE — Telephone Encounter (Signed)
Patient returned office phone call. She drinks 3 bottles of water a day and she does eat. Lab appointment made for 11/11/2022 to check sodium .

## 2022-11-09 NOTE — Telephone Encounter (Signed)
Noted. Sodium lab ordered future

## 2022-11-09 NOTE — Telephone Encounter (Signed)
-----   Message from Dale Corydon, MD sent at 11/09/2022 11:13 AM EDT ----- Notify - white blood cell count and hgb - wnl.  Sodium low.  Confirm she is not drinking an excessive amount of free water.  Needs to decrease fluid intake by 1/3.  Also, make sure she is eating.  Recheck sodium within the next couple of days.   Just sodium. Thyroid and liver test wnl. Cholesterol looks good.

## 2022-11-11 ENCOUNTER — Other Ambulatory Visit (INDEPENDENT_AMBULATORY_CARE_PROVIDER_SITE_OTHER): Payer: Medicare Other

## 2022-11-11 DIAGNOSIS — E871 Hypo-osmolality and hyponatremia: Secondary | ICD-10-CM | POA: Diagnosis not present

## 2022-11-11 NOTE — Addendum Note (Signed)
Addended by: Clearnce Sorrel on: 11/11/2022 02:26 PM   Modules accepted: Orders

## 2022-11-12 LAB — SODIUM: Sodium: 129 mmol/L — ABNORMAL LOW (ref 134–144)

## 2022-11-13 ENCOUNTER — Encounter: Payer: Self-pay | Admitting: Internal Medicine

## 2022-11-13 NOTE — Assessment & Plan Note (Signed)
Has chronic venous insufficiency. Hose help.  Has seen [facility].  

## 2022-11-13 NOTE — Assessment & Plan Note (Signed)
On gabapentin.  

## 2022-11-13 NOTE — Assessment & Plan Note (Signed)
On simvastatin.  Low cholesterol diet and exercise.  Follow lipid panel and liver function tests.   

## 2022-11-13 NOTE — Assessment & Plan Note (Signed)
Avoid antiinflammatories.  Stay hydrated.  Follow met b.   

## 2022-11-13 NOTE — Assessment & Plan Note (Signed)
Followed by rheumatology.  On remicade. Follow.  Stable.  

## 2022-11-13 NOTE — Assessment & Plan Note (Signed)
Has been followed by GI.  Follow liver function tests.  

## 2022-11-13 NOTE — Assessment & Plan Note (Signed)
Reports increased fatigue.  Discussed exercise - regular.  Check routine labs as outlined. Further w/up pending results. No chest pain or sob.

## 2022-11-13 NOTE — Assessment & Plan Note (Signed)
History of afib.  On diltiazem and metoprolol.  Followed by cardiology.  Off eliquis.  Per Dr Fath's note, off due to increased bleeding risk.  Follow.  

## 2022-11-13 NOTE — Assessment & Plan Note (Signed)
Recheck sodium today.  Eating.   °

## 2022-11-13 NOTE — Assessment & Plan Note (Addendum)
On diltiazem and lasix.  Blood pressures doing well.  Not orthostatic on exam.  Pressures.  Follow metabolic panel.

## 2022-11-13 NOTE — Assessment & Plan Note (Signed)
Follow met b and a1c.  

## 2022-11-13 NOTE — Assessment & Plan Note (Signed)
Controlled on prilosec.   

## 2022-11-13 NOTE — Assessment & Plan Note (Signed)
Follow cbc.  

## 2022-11-14 ENCOUNTER — Encounter: Payer: Self-pay | Admitting: Internal Medicine

## 2022-11-14 ENCOUNTER — Other Ambulatory Visit: Payer: Self-pay | Admitting: Internal Medicine

## 2022-11-14 DIAGNOSIS — E871 Hypo-osmolality and hyponatremia: Secondary | ICD-10-CM

## 2022-11-14 NOTE — Progress Notes (Signed)
Orders placed for labs

## 2022-11-18 ENCOUNTER — Other Ambulatory Visit (INDEPENDENT_AMBULATORY_CARE_PROVIDER_SITE_OTHER): Payer: Medicare Other

## 2022-11-18 ENCOUNTER — Telehealth: Payer: Self-pay | Admitting: *Deleted

## 2022-11-18 DIAGNOSIS — E871 Hypo-osmolality and hyponatremia: Secondary | ICD-10-CM

## 2022-11-18 LAB — CORTISOL: Cortisol, Plasma: 2.3 ug/dL

## 2022-11-18 LAB — SODIUM: Sodium: 136 mEq/L (ref 135–145)

## 2022-11-18 NOTE — Telephone Encounter (Signed)
Pt did not show for labs this morning (Cortisol & Sodium)

## 2022-11-18 NOTE — Telephone Encounter (Signed)
Update, pt arrived closer to 9am instead of 8:15

## 2022-11-18 NOTE — Telephone Encounter (Signed)
Noted.  Thank you for letting me know.

## 2022-11-29 ENCOUNTER — Encounter: Payer: Self-pay | Admitting: Emergency Medicine

## 2022-11-29 ENCOUNTER — Emergency Department: Payer: Medicare Other

## 2022-11-29 ENCOUNTER — Other Ambulatory Visit: Payer: Self-pay

## 2022-11-29 ENCOUNTER — Inpatient Hospital Stay
Admission: EM | Admit: 2022-11-29 | Discharge: 2022-12-04 | DRG: 488 | Disposition: A | Discharge status: Home Health | Payer: Medicare Other | Attending: Internal Medicine | Admitting: Internal Medicine

## 2022-11-29 DIAGNOSIS — I083 Combined rheumatic disorders of mitral, aortic and tricuspid valves: Secondary | ICD-10-CM | POA: Diagnosis present

## 2022-11-29 DIAGNOSIS — M810 Age-related osteoporosis without current pathological fracture: Secondary | ICD-10-CM | POA: Diagnosis present

## 2022-11-29 DIAGNOSIS — R0789 Other chest pain: Secondary | ICD-10-CM | POA: Diagnosis not present

## 2022-11-29 DIAGNOSIS — M25562 Pain in left knee: Secondary | ICD-10-CM | POA: Diagnosis present

## 2022-11-29 DIAGNOSIS — M6588 Other synovitis and tenosynovitis, other site: Secondary | ICD-10-CM | POA: Diagnosis present

## 2022-11-29 DIAGNOSIS — Z803 Family history of malignant neoplasm of breast: Secondary | ICD-10-CM

## 2022-11-29 DIAGNOSIS — E785 Hyperlipidemia, unspecified: Secondary | ICD-10-CM | POA: Diagnosis present

## 2022-11-29 DIAGNOSIS — I110 Hypertensive heart disease with heart failure: Secondary | ICD-10-CM | POA: Diagnosis present

## 2022-11-29 DIAGNOSIS — F32A Depression, unspecified: Secondary | ICD-10-CM | POA: Diagnosis present

## 2022-11-29 DIAGNOSIS — S83519A Sprain of anterior cruciate ligament of unspecified knee, initial encounter: Secondary | ICD-10-CM | POA: Diagnosis present

## 2022-11-29 DIAGNOSIS — E7800 Pure hypercholesterolemia, unspecified: Secondary | ICD-10-CM | POA: Diagnosis not present

## 2022-11-29 DIAGNOSIS — D72829 Elevated white blood cell count, unspecified: Secondary | ICD-10-CM | POA: Diagnosis not present

## 2022-11-29 DIAGNOSIS — M25561 Pain in right knee: Secondary | ICD-10-CM | POA: Diagnosis present

## 2022-11-29 DIAGNOSIS — M79605 Pain in left leg: Secondary | ICD-10-CM | POA: Diagnosis not present

## 2022-11-29 DIAGNOSIS — M4312 Spondylolisthesis, cervical region: Secondary | ICD-10-CM | POA: Diagnosis not present

## 2022-11-29 DIAGNOSIS — I4891 Unspecified atrial fibrillation: Secondary | ICD-10-CM | POA: Diagnosis not present

## 2022-11-29 DIAGNOSIS — M23203 Derangement of unspecified medial meniscus due to old tear or injury, right knee: Secondary | ICD-10-CM | POA: Diagnosis present

## 2022-11-29 DIAGNOSIS — R079 Chest pain, unspecified: Secondary | ICD-10-CM

## 2022-11-29 DIAGNOSIS — J90 Pleural effusion, not elsewhere classified: Secondary | ICD-10-CM | POA: Diagnosis not present

## 2022-11-29 DIAGNOSIS — M1990 Unspecified osteoarthritis, unspecified site: Secondary | ICD-10-CM | POA: Diagnosis present

## 2022-11-29 DIAGNOSIS — M0689 Other specified rheumatoid arthritis, multiple sites: Secondary | ICD-10-CM | POA: Diagnosis present

## 2022-11-29 DIAGNOSIS — I209 Angina pectoris, unspecified: Secondary | ICD-10-CM | POA: Diagnosis not present

## 2022-11-29 DIAGNOSIS — Z881 Allergy status to other antibiotic agents status: Secondary | ICD-10-CM

## 2022-11-29 DIAGNOSIS — I2510 Atherosclerotic heart disease of native coronary artery without angina pectoris: Secondary | ICD-10-CM | POA: Diagnosis present

## 2022-11-29 DIAGNOSIS — M1120 Other chondrocalcinosis, unspecified site: Secondary | ICD-10-CM | POA: Diagnosis not present

## 2022-11-29 DIAGNOSIS — M94261 Chondromalacia, right knee: Secondary | ICD-10-CM | POA: Diagnosis present

## 2022-11-29 DIAGNOSIS — I351 Nonrheumatic aortic (valve) insufficiency: Secondary | ICD-10-CM | POA: Diagnosis not present

## 2022-11-29 DIAGNOSIS — I5030 Unspecified diastolic (congestive) heart failure: Secondary | ICD-10-CM | POA: Diagnosis not present

## 2022-11-29 DIAGNOSIS — M79604 Pain in right leg: Secondary | ICD-10-CM | POA: Diagnosis not present

## 2022-11-29 DIAGNOSIS — M25461 Effusion, right knee: Principal | ICD-10-CM

## 2022-11-29 DIAGNOSIS — M25522 Pain in left elbow: Secondary | ICD-10-CM | POA: Diagnosis not present

## 2022-11-29 DIAGNOSIS — Z79620 Long term (current) use of immunosuppressive biologic: Secondary | ICD-10-CM

## 2022-11-29 DIAGNOSIS — R262 Difficulty in walking, not elsewhere classified: Secondary | ICD-10-CM | POA: Diagnosis not present

## 2022-11-29 DIAGNOSIS — M542 Cervicalgia: Secondary | ICD-10-CM | POA: Diagnosis not present

## 2022-11-29 DIAGNOSIS — M064 Inflammatory polyarthropathy: Secondary | ICD-10-CM | POA: Diagnosis not present

## 2022-11-29 DIAGNOSIS — I480 Paroxysmal atrial fibrillation: Secondary | ICD-10-CM | POA: Diagnosis not present

## 2022-11-29 DIAGNOSIS — K5900 Constipation, unspecified: Secondary | ICD-10-CM | POA: Diagnosis not present

## 2022-11-29 DIAGNOSIS — I5032 Chronic diastolic (congestive) heart failure: Secondary | ICD-10-CM | POA: Diagnosis present

## 2022-11-29 DIAGNOSIS — Z885 Allergy status to narcotic agent status: Secondary | ICD-10-CM

## 2022-11-29 DIAGNOSIS — E871 Hypo-osmolality and hyponatremia: Secondary | ICD-10-CM | POA: Diagnosis not present

## 2022-11-29 DIAGNOSIS — T8453XA Infection and inflammatory reaction due to internal right knee prosthesis, initial encounter: Secondary | ICD-10-CM | POA: Diagnosis not present

## 2022-11-29 DIAGNOSIS — M069 Rheumatoid arthritis, unspecified: Secondary | ICD-10-CM | POA: Diagnosis present

## 2022-11-29 DIAGNOSIS — I493 Ventricular premature depolarization: Secondary | ICD-10-CM | POA: Diagnosis present

## 2022-11-29 DIAGNOSIS — K625 Hemorrhage of anus and rectum: Secondary | ICD-10-CM | POA: Diagnosis not present

## 2022-11-29 DIAGNOSIS — Z7982 Long term (current) use of aspirin: Secondary | ICD-10-CM

## 2022-11-29 DIAGNOSIS — G4733 Obstructive sleep apnea (adult) (pediatric): Secondary | ICD-10-CM | POA: Diagnosis not present

## 2022-11-29 DIAGNOSIS — I700 Atherosclerosis of aorta: Secondary | ICD-10-CM | POA: Diagnosis not present

## 2022-11-29 DIAGNOSIS — Z79899 Other long term (current) drug therapy: Secondary | ICD-10-CM

## 2022-11-29 DIAGNOSIS — M23200 Derangement of unspecified lateral meniscus due to old tear or injury, right knee: Secondary | ICD-10-CM | POA: Diagnosis not present

## 2022-11-29 DIAGNOSIS — D649 Anemia, unspecified: Secondary | ICD-10-CM | POA: Diagnosis present

## 2022-11-29 DIAGNOSIS — K219 Gastro-esophageal reflux disease without esophagitis: Secondary | ICD-10-CM | POA: Diagnosis present

## 2022-11-29 DIAGNOSIS — I10 Essential (primary) hypertension: Secondary | ICD-10-CM | POA: Diagnosis not present

## 2022-11-29 DIAGNOSIS — Z90710 Acquired absence of both cervix and uterus: Secondary | ICD-10-CM

## 2022-11-29 DIAGNOSIS — Z8249 Family history of ischemic heart disease and other diseases of the circulatory system: Secondary | ICD-10-CM

## 2022-11-29 DIAGNOSIS — Z9049 Acquired absence of other specified parts of digestive tract: Secondary | ICD-10-CM

## 2022-11-29 DIAGNOSIS — Z882 Allergy status to sulfonamides status: Secondary | ICD-10-CM

## 2022-11-29 DIAGNOSIS — I4892 Unspecified atrial flutter: Secondary | ICD-10-CM | POA: Diagnosis not present

## 2022-11-29 DIAGNOSIS — Z801 Family history of malignant neoplasm of trachea, bronchus and lung: Secondary | ICD-10-CM

## 2022-11-29 DIAGNOSIS — Z825 Family history of asthma and other chronic lower respiratory diseases: Secondary | ICD-10-CM

## 2022-11-29 DIAGNOSIS — Z880 Allergy status to penicillin: Secondary | ICD-10-CM

## 2022-11-29 DIAGNOSIS — K449 Diaphragmatic hernia without obstruction or gangrene: Secondary | ICD-10-CM | POA: Diagnosis present

## 2022-11-29 DIAGNOSIS — Z888 Allergy status to other drugs, medicaments and biological substances status: Secondary | ICD-10-CM

## 2022-11-29 DIAGNOSIS — I350 Nonrheumatic aortic (valve) stenosis: Secondary | ICD-10-CM | POA: Diagnosis not present

## 2022-11-29 LAB — BASIC METABOLIC PANEL
Anion gap: 11 (ref 5–15)
BUN: 14 mg/dL (ref 8–23)
CO2: 25 mmol/L (ref 22–32)
Calcium: 9.4 mg/dL (ref 8.9–10.3)
Chloride: 95 mmol/L — ABNORMAL LOW (ref 98–111)
Creatinine, Ser: 1.04 mg/dL — ABNORMAL HIGH (ref 0.44–1.00)
GFR, Estimated: 55 mL/min — ABNORMAL LOW (ref >60)
Glucose, Bld: 136 mg/dL — ABNORMAL HIGH (ref 70–99)
Potassium: 4.7 mmol/L (ref 3.5–5.1)
Sodium: 131 mmol/L — ABNORMAL LOW (ref 135–145)

## 2022-11-29 LAB — CBC WITH DIFFERENTIAL/PLATELET
Abs Immature Granulocytes: 0.04 10*3/uL (ref 0.00–0.07)
Basophils Absolute: 0 10*3/uL (ref 0.0–0.1)
Basophils Relative: 0 %
Eosinophils Absolute: 0 10*3/uL (ref 0.0–0.5)
Eosinophils Relative: 0 %
HCT: 41.2 % (ref 36.0–46.0)
Hemoglobin: 14.1 g/dL (ref 12.0–15.0)
Immature Granulocytes: 0 %
Lymphocytes Relative: 10 %
Lymphs Abs: 1.4 10*3/uL (ref 0.7–4.0)
MCH: 31.8 pg (ref 26.0–34.0)
MCHC: 34.2 g/dL (ref 30.0–36.0)
MCV: 93 fL (ref 80.0–100.0)
Monocytes Absolute: 1.9 10*3/uL — ABNORMAL HIGH (ref 0.1–1.0)
Monocytes Relative: 13 %
Neutro Abs: 10.5 10*3/uL — ABNORMAL HIGH (ref 1.7–7.7)
Neutrophils Relative %: 77 %
Platelets: 243 10*3/uL (ref 150–400)
RBC: 4.43 MIL/uL (ref 3.87–5.11)
RDW: 13 % (ref 11.5–15.5)
WBC: 13.9 10*3/uL — ABNORMAL HIGH (ref 4.0–10.5)
nRBC: 0 % (ref 0.0–0.2)

## 2022-11-29 LAB — BODY FLUID CULTURE W GRAM STAIN

## 2022-11-29 LAB — SYNOVIAL CELL COUNT + DIFF, W/ CRYSTALS
Crystals, Fluid: NONE SEEN
Eosinophils-Synovial: 0 %
Lymphocytes-Synovial Fld: 7 %
Monocyte-Macrophage-Synovial Fluid: 13 %
Neutrophil, Synovial: 80 %
WBC, Synovial: 38335 /mm3 — ABNORMAL HIGH (ref 0–200)

## 2022-11-29 LAB — LACTIC ACID, PLASMA
Lactic Acid, Venous: 1.4 mmol/L (ref 0.5–1.9)
Lactic Acid, Venous: 1.9 mmol/L (ref 0.5–1.9)

## 2022-11-29 LAB — C-REACTIVE PROTEIN: CRP: 5.6 mg/dL — ABNORMAL HIGH (ref <1.0)

## 2022-11-29 LAB — SEDIMENTATION RATE: Sed Rate: 18 mm/hr (ref 0–30)

## 2022-11-29 MED ORDER — VANCOMYCIN HCL 1250 MG/250ML IV SOLN
1250.0000 mg | Freq: Once | INTRAVENOUS | Status: DC
  Filled 2022-11-29: qty 250

## 2022-11-29 MED ORDER — VANCOMYCIN HCL 750 MG/150ML IV SOLN: 750.0000 mg | INTRAVENOUS | Status: DC

## 2022-11-29 MED ORDER — BUPIVACAINE HCL (PF) 0.5 % IJ SOLN
20.0000 mL | Freq: Once | INTRAMUSCULAR | Status: AC
  Administered 2022-11-29: 20 mL
  Filled 2022-11-29: qty 20

## 2022-11-29 MED ORDER — LIDOCAINE HCL (PF) 1 % IJ SOLN
5.0000 mL | Freq: Once | INTRAMUSCULAR | Status: AC
  Administered 2022-11-29: 5 mL via INTRADERMAL
  Filled 2022-11-29: qty 5

## 2022-11-29 MED ORDER — TRAMADOL HCL 50 MG PO TABS
50.0000 mg | ORAL_TABLET | Freq: Once | ORAL | Status: AC
  Administered 2022-11-29: 50 mg via ORAL
  Filled 2022-11-29: qty 1

## 2022-11-29 MED ORDER — MORPHINE SULFATE (PF) 2 MG/ML IV SOLN
2.0000 mg | Freq: Once | INTRAVENOUS | Status: AC
  Administered 2022-11-29: 2 mg via INTRAVENOUS
  Filled 2022-11-29: qty 1

## 2022-11-29 NOTE — Progress Notes (Signed)
Pharmacy Antibiotic Note  Kaitlyn Huff is a 82 y.o. female admitted on 11/29/2022 with  septic joint .  Pharmacy has been consulted for Vanc dosing.  Plan: Vancomycin 1250 mg IV X 1 ordered for 5/21 @ ~ 2300. Vancomycin 750 mg IV Q24H ordered to start on 5/22 @ 2200.  AUC = 515.3 Vanc trough = 13.8   Height: 5\' 5"  (165.1 cm) Weight: 54.4 kg (120 lb) IBW/kg (Calculated) : 57  Temp (24hrs), Avg:98.3 F (36.8 C), Min:98.3 F (36.8 C), Max:98.3 F (36.8 C)  Recent Labs  Lab 11/29/22 1500  WBC 13.9*  CREATININE 1.04*    Estimated Creatinine Clearance: 37.7 mL/min (A) (by C-G formula based on SCr of 1.04 mg/dL (H)).    Allergies  Allergen Reactions   Astelin [Azelastine Hcl] Other (See Comments)    Reaction:  Unknown    Codeine Other (See Comments)    Reaction:  Altered mental status   Dilaudid [Hydromorphone] Other (See Comments)    Reaction:  Unknown    Flexeril [Cyclobenzaprine] Other (See Comments)    Reaction:  Unknown    Imuran [Azathioprine] Other (See Comments)    Reaction:  Abnormal liver function   Lisinopril Itching   Lyrica [Pregabalin] Other (See Comments)    Reaction:  Sore gums    Methotrexate Derivatives Other (See Comments)    Reaction:  Abnormal liver function   Amiodarone Anxiety and Other (See Comments)    Pt could not eat or sleep. Caused n/v and "messed her up"   Amoxicillin Rash and Other (See Comments)    Unable to obtain enough information to answer additional questions about this medication.     Arava [Leflunomide] Rash   Clindamycin/Lincomycin Rash   Doxycycline Rash   Lodine [Etodolac] Rash   Percocet [Oxycodone-Acetaminophen] Rash   Sulfa Antibiotics Rash    Antimicrobials this admission:   >>    >>   Dose adjustments this admission:   Microbiology results:  BCx:   UCx:    Sputum:    MRSA PCR:   Thank you for allowing pharmacy to be a part of this patient's care.  Dawnetta Copenhaver D 11/29/2022 10:01 PM

## 2022-11-29 NOTE — ED Provider Notes (Signed)
[facility] Provider Note    Event Date/Time   First MD Initiated Contact with Patient 11/29/22 1347     (approximate)   History   Leg Pain   HPI  Kaitlyn Huff is a 82 y.o. female with past medical history of osteoarthritis, osteoporosis, RA, HTN, HLD, who presents with bilateral leg pain since this morning.  Patient denies any falls or injuries to the legs.  States that her pain is throughout her legs, but if she had to pick where her pain is most severe she states it is her right knee.  She says that it began swelling yesterday.  She is normally able to walk independently without any assistive devices, but this morning she cannot get out of bed due to her pain.  She denies any fever. She has not taken any pain medication at home.  She lives alone but has frequent visits from her sisters.    Physical Exam   Triage Vital Signs: ED Triage Vitals  Enc Vitals Group     BP 11/29/22 1314 (!) 115/53     Pulse Rate 11/29/22 1314 85     Resp 11/29/22 1314 16     Temp 11/29/22 1314 98.3 F (36.8 C)     Temp Source 11/29/22 1314 Oral     SpO2 11/29/22 1314 95 %     Weight 11/29/22 1256 120 lb (54.4 kg)     Height 11/29/22 1256 5\' 5"  (1.651 m)     Head Circumference --      Peak Flow --      Pain Score 11/29/22 1256 10     Pain Loc --      Pain Edu? --      Excl. in GC? --     Most recent vital signs: Vitals:   11/29/22 1314  BP: (!) 115/53  Pulse: 85  Resp: 16  Temp: 98.3 F (36.8 C)  SpO2: 95%    General: Awake, no distress. Difficult for patient to get comfortable in bed.  CV:  Good peripheral perfusion. RRR, no M/R/G. Resp:  Normal effort. CTA. Abd:  No distention.  Extremities: TTP throughout bilateral legs, limited ROM due to pain bilaterally, R knee is swollen, tender, erythematous and warm to touch, bilateral calves are not swollen or tender. Pain elicited when pressure applied to heel, compressing the right knee joint.    ED Results /  Procedures / Treatments   Labs (all labs ordered are listed, but only abnormal results are displayed) Labs Reviewed  CBC WITH DIFFERENTIAL/PLATELET - Abnormal; Notable for the following components:      Result Value   WBC 13.9 (*)    Neutro Abs 10.5 (*)    Monocytes Absolute 1.9 (*)    All other components within normal limits  BASIC METABOLIC PANEL - Abnormal; Notable for the following components:   Sodium 131 (*)    Chloride 95 (*)    Glucose, Bld 136 (*)    Creatinine, Ser 1.04 (*)    GFR, Estimated 55 (*)    All other components within normal limits  BODY FLUID CULTURE W GRAM STAIN  SEDIMENTATION RATE  C-REACTIVE PROTEIN  GLUCOSE, BODY FLUID OTHER            PROTEIN, BODY FLUID (OTHER)  SYNOVIAL CELL COUNT + DIFF, W/ CRYSTALS  URIC ACID, BODY FLUID    RADIOLOGY  4 view right knee xray obtained today in ED. I interpreted the images myself as  well as reviewed the radiologist report and I agree. Xrays showed a large suprapatellar joint effusion.  PROCEDURES:  Critical Care performed: No  .Joint Aspiration/Arthrocentesis  Date/Time: 11/29/2022 6:45 PM  Performed by: Cruz Condon, PA-C Authorized by: Cruz Condon, PA-C   Consent:    Consent obtained:  Written   Consent given by:  Patient   Risks, benefits, and alternatives were discussed: yes     Risks discussed:  Bleeding, infection and pain   Alternatives discussed:  No treatment Universal protocol:    Procedure explained and questions answered to patient or proxy's satisfaction: yes     Relevant documents present and verified: yes     Imaging studies available: yes     Site/side marked: yes     Patient identity confirmed:  Verbally with patient and arm band Location:    Location:  Knee   Knee:  R knee Anesthesia:    Anesthesia method:  Local infiltration   Local anesthetic:  Bupivacaine 0.5% w/o epi and lidocaine 1% w/o epi Procedure details:    Preparation: Patient was prepped and draped in  usual sterile fashion     Needle gauge:  18 G   Ultrasound guidance: no     Approach:  Medial   Aspirate amount:  50cc   Aspirate characteristics:  Yellow and cloudy   Steroid injected: no     Specimen collected: yes (synovial fluid)   Post-procedure details:    Dressing:  Adhesive bandage   Procedure completion:  Tolerated well, no immediate complications   MEDICATIONS ORDERED IN ED: Medications  bupivacaine(PF) (MARCAINE) 0.5 % injection 20 mL (has no administration in time range)  lidocaine (PF) (XYLOCAINE) 1 % injection 5 mL (has no administration in time range)  traMADol (ULTRAM) tablet 50 mg (50 mg Oral Given 11/29/22 1439)     IMPRESSION / MDM / ASSESSMENT AND PLAN / ED COURSE  I reviewed the triage vital signs and the nursing notes.                              Differential diagnosis includes, but is not limited to, RA flare, septic arthritis, osteoarthritis, gout, pseudogout and DVT.   Patient's presentation is most consistent with exacerbation of chronic illness.  Patient presented to the ED today for evaluation of bilateral leg pain, with most of it localized to the right knee. On exam patient's right knee is warm, tender, swollen and erythematous. 4 view knee xrays were obtained.  I independently reviewed the images and radiologist report, which showed large suprapatellar effusion.  I independently reviewed her labs including CBC, BMP, ESR and CRP.  Patient has an elevated white cell count, is hyponatremic, is hypochloremic and normal ESR. Patient was given tramadol for pain control, which despite being on her allergy list, she has tolerated well before. My biggest concern given her presentation is septic arthritis, so I discussed with patient and family members performing a joint aspiration to evaluate the fluid. We talked about risks benefits and alternatives. Patient consented to the procedure.  See procedure note above.  Aspirated synovial fluid was sent for analysis.   Patient reports some pain relief after the aspiration.  At the time of shift change I was still waiting for the synovial fluid results.  Care was transferred to Willeen Cass, PA-C, who will make final diagnosis and disposition.  I informed the patient of the transfer of care.  She is resting comfortably in  the room and voices understanding that a new provider will be taking over.   FINAL CLINICAL IMPRESSION(S) / ED DIAGNOSES   Final diagnoses:  None     Rx / DC Orders   ED Discharge Orders     None        Note:  This document was prepared using Dragon voice recognition software and may include unintentional dictation errors.   Cruz Condon, PA-C 11/29/22 1950    Merwyn Katos, MD 11/30/22 1343

## 2022-11-29 NOTE — ED Provider Notes (Addendum)
----------------------------------------- 10:01 PM on 11/29/2022 -----------------------------------------  Blood pressure (!) 115/53, pulse 85, temperature 98.3 F (36.8 C), temperature source Oral, resp. rate 16, height 5\' 5"  (1.651 m), weight 54.4 kg, SpO2 95 %.  Assuming care from [employer], PA-C.  In short, Kaitlyn Huff is a 82 y.o. female with a chief complaint of Leg Pain .  Refer to the original H&P for additional details.  Patient presents with non-traumatic right knee pain and effusion, concerning for septic arthritis. The current plan of care is to await synovial fluid analysis and disposition accordingly.  ____________________________________________    ED Results / Procedures / Treatments   Labs (all labs ordered are listed, but only abnormal results are displayed) Labs Reviewed  CBC WITH DIFFERENTIAL/PLATELET - Abnormal; Notable for the following components:      Result Value   WBC 13.9 (*)    Neutro Abs 10.5 (*)    Monocytes Absolute 1.9 (*)    All other components within normal limits  BASIC METABOLIC PANEL - Abnormal; Notable for the following components:   Sodium 131 (*)    Chloride 95 (*)    Glucose, Bld 136 (*)    Creatinine, Ser 1.04 (*)    GFR, Estimated 55 (*)    All other components within normal limits  SYNOVIAL CELL COUNT + DIFF, W/ CRYSTALS - Abnormal; Notable for the following components:   Color, Synovial YELLOW (*)    Appearance-Synovial CLOUDY (*)    WBC, Synovial 38,335 (*)    All other components within normal limits  BODY FLUID CULTURE W GRAM STAIN  CULTURE, BLOOD (ROUTINE X 2)  CULTURE, BLOOD (ROUTINE X 2)  SEDIMENTATION RATE  LACTIC ACID, PLASMA  C-REACTIVE PROTEIN  GLUCOSE, BODY FLUID OTHER            PROTEIN, BODY FLUID (OTHER)  URIC ACID, BODY FLUID  LACTIC ACID, PLASMA    EKG   RADIOLOGY  I personally viewed and evaluated these images as part of my medical decision making, as well as reviewing the written report  by the radiologist.  ED Provider Interpretation: effusion and DJD noted  DG Knee Complete 4 Views Right  Result Date: 11/29/2022 CLINICAL DATA:  Bilateral knee pain. EXAM: RIGHT KNEE - COMPLETE 4+ VIEW COMPARISON:  February 27, 2019. FINDINGS: No definite fracture or dislocation is noted. Large suprapatellar joint effusion is noted. 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 IMPRESSION: Large suprapatellar joint effusion. Mild degenerative change is noted medially. No fracture or dislocation. Electronically Signed   By: Lupita Raider M.D.   On: 11/29/2022 14:36     PROCEDURES:  Critical Care performed: No  Procedures   MEDICATIONS ORDERED IN ED: Medications  vancomycin (VANCOREADY) IVPB 1250 mg/250 mL (0 mg Intravenous Hold 11/29/22 2216)  vancomycin (VANCOREADY) IVPB 750 mg/150 mL (has no administration in time range)  traMADol (ULTRAM) tablet 50 mg (50 mg Oral Given 11/29/22 1439)  bupivacaine(PF) (MARCAINE) 0.5 % injection 20 mL (20 mLs Infiltration Given 11/29/22 2210)  lidocaine (PF) (XYLOCAINE) 1 % injection 5 mL (5 mLs Intradermal Given 11/29/22 2210)  morphine (PF) 2 MG/ML injection 2 mg (2 mg Intravenous Given 11/29/22 2216)    IMPRESSION / MDM / ASSESSMENT AND PLAN / ED COURSE  I reviewed the triage vital signs and the nursing notes.  Differential diagnosis includes, but is not limited to, septic arthritis, gouty arthritis, pseudogout, septic bursitis, RA  Patient's presentation is most consistent with acute complicated illness / injury requiring diagnostic workup.  ----------------------------------------- 10:15 PM on 11/29/2022 ----------------------------------------- S/W Dr. Martha Clan: he suggest admission to the hospitalist service for further management. He voices concern for possible gout/pseudogout while awaiting pending    ----------------------------------------- 10:40 PM on  11/29/2022 ----------------------------------------- S/W Dr. Skip Mayer: she will admit for pain control and decreased mobility due to joint effusion and underlying RA. Fluid gram-stain and uric acid pending at this time.  Patient's diagnosis is consistent with inflammatory arthritis and joint effusion; concerning clinically for septic joint. Patient will be admitted to the hospitalist service for pain control  Clinical Course as of 11/29/22 2249  Tue Nov 29, 2022  1552 NEUT#(!): 10.5 [LD]  2202 WBC, Synovial(!): 38,335 Synovial sample noted to be elevated c/w septic joint. [JM]    Clinical Course User Index [JM] Keelan Tripodi, Charlesetta Ivory, PA-C [LD] Cruz Condon, PA-C    FINAL CLINICAL IMPRESSION(S) / ED DIAGNOSES   Final diagnoses:  Knee effusion, right     Rx / DC Orders   ED Discharge Orders     None        Note:  This document was prepared using Dragon voice recognition software and may include unintentional dictation errors.    Lissa Hoard, PA-C 11/29/22 2246    Lissa Hoard, PA-C 11/29/22 2249    Corena Herter, MD 11/29/22 2350

## 2022-11-29 NOTE — ED Triage Notes (Addendum)
First Nurse Note:  Pt via EMS from home. Pt c/o bilateral leg pain that started 0700, pt has a hx of RA. Denies any falls or injuries to the legs. Per EMS, VSS. 138/65 BP, 95% on RA  76 HR

## 2022-11-29 NOTE — H&P (Signed)
History and Physical    Kaitlyn Huff:[id number] DOB: 09-Jun-1941 DOA: 11/29/2022  PCP: [place], MD  Patient coming from: home  I have personally briefly reviewed patient's old medical records in [facility]  Chief Complaint: right swollen pain full knee x 1 day  HPI: Kaitlyn Huff is a 82 y.o. female with medical history significant of   HLD, HTN , RA on infusion q 2 months, valvular heart disease stable, GERD,  who presents to ED  with one day  swelling and pain  of right knee. She notes no associated fever/chills / sob/ chest pain / n/v/ or abdominal pain. Patient notes current symptoms are similar to her typical flare but more painful.  Patient denies any fall or injury. Patient states she noted symptoms on awaking this am. She also note she is unable to walk due to pain. She states she had a near fall prior to presenting to ED.  She also notes pain in her left knee but states it is not as severe.    ED Course:  Afeb, bp 115/53, rr 85 , rr 16 sat 95%   Right knee xray  IMPRESSION: Large suprapatellar joint effusion. Mild degenerative change is noted medially. No fracture or dislocation.  Labs Wbc 13.9, hg 14.1 pnm 10.5 Na 131, K 4.7, glu 136,  Esr 18 Cr 5.6  Lactic 1.4 Joint asp  No crystals, wbc 38335 pmn 80  Gram stain  No organism seen  Culture pending  Tx vanc / morphine /azactam   Review of Systems: As per HPI otherwise 10 point review of systems negative.   Past Medical History:  Diagnosis Date   Anemia    Cancer (HCC)    Diverticulitis    GERD (gastroesophageal reflux disease)    Hyperlipidemia    Hypertension    Osteoarthritis    Osteoporosis    actonel   Positive PPD    s/p INH (2006)   Rheumatoid arthritis(714.0)    MTX transaminitis, Leflunomide (rash), enbrel, plaquinil, prednisone, remicade, Imuran (transaminitis)   Valvular heart disease    moderate MR and TR    Past Surgical History:  Procedure Laterality Date    ABDOMINAL HYSTERECTOMY     CERVICAL CONE BIOPSY     CHOLECYSTECTOMY  06/22/14   COLONOSCOPY WITH PROPOFOL N/A 07/09/2015   Procedure: COLONOSCOPY WITH PROPOFOL;  Surgeon: Scot Jun, MD;  Location: [facility] ENDOSCOPY;  Service: Endoscopy;  Laterality: N/A;   ESOPHAGOGASTRODUODENOSCOPY (EGD) WITH PROPOFOL N/A 07/09/2015   Procedure: ESOPHAGOGASTRODUODENOSCOPY (EGD) WITH PROPOFOL;  Surgeon: Scot Jun, MD;  Location: [facility] ENDOSCOPY;  Service: Endoscopy;  Laterality: N/A;   OVARY SURGERY     TRACHEOSTOMY  1959   TUBAL LIGATION       reports that she has never smoked. She has never used smokeless tobacco. She reports that she does not drink alcohol and does not use drugs.  Allergies  Allergen Reactions   Astelin [Azelastine Hcl] Other (See Comments)    Reaction:  Unknown    Codeine Other (See Comments)    Reaction:  Altered mental status   Dilaudid [Hydromorphone] Other (See Comments)    Reaction:  Unknown    Flexeril [Cyclobenzaprine] Other (See Comments)    Reaction:  Unknown    Imuran [Azathioprine] Other (See Comments)    Reaction:  Abnormal liver function   Lisinopril Itching   Lyrica [Pregabalin] Other (See Comments)    Reaction:  Sore gums    Methotrexate Derivatives Other (  See Comments)    Reaction:  Abnormal liver function   Amiodarone Anxiety and Other (See Comments)    Pt could not eat or sleep. Caused n/v and "messed her up"   Amoxicillin Rash and Other (See Comments)    Unable to obtain enough information to answer additional questions about this medication.     Arava [Leflunomide] Rash   Clindamycin/Lincomycin Rash   Doxycycline Rash   Lodine [Etodolac] Rash   Percocet [Oxycodone-Acetaminophen] Rash   Sulfa Antibiotics Rash    Family History  Problem Relation Age of Onset   Heart disease Father        MI   Heart disease Mother    Valvular heart disease Mother    Breast cancer Sister 72   Cancer Sister        Lung cancer   COPD Sister    Heart  disease Sister    Colon cancer Neg Hx     Prior to Admission medications   Medication Sig Start Date End Date Taking? Authorizing Provider  albuterol (VENTOLIN HFA) 108 (90 Base) MCG/ACT inhaler USE 2 INHALATIONS EVERY 6 HOURS AS NEEDED FOR WHEEZING OR SHORTNESS OF BREATH 07/12/22   Dale [place], MD  ALPRAZolam Prudy Feeler) 0.25 MG tablet Take 1 tablet (0.25 mg total) by mouth 2 (two) times daily as needed for anxiety. 04/05/19   Enid Baas, MD  atorvastatin (LIPITOR) 20 MG tablet Take 20 mg by mouth daily. 09/26/22   [provider]  cetirizine (ZYRTEC) 10 MG tablet Take 1 tablet (10 mg total) by mouth daily. 11/08/22   Dale [place], MD  colestipol (COLESTID) 1 g tablet Take 2 g by mouth daily.    [provider]  diltiazem (CARDIZEM CD) 180 MG 24 hr capsule Take 180 mg by mouth daily. 06/14/21   [provider]  diltiazem (CARDIZEM) 30 MG tablet Take 30 mg by mouth daily as needed. 04/20/22   [provider]  fluticasone (FLONASE) 50 MCG/ACT nasal spray USE 2 SPRAYS IN EACH NOSTRIL DAILY (CALL OFFICE SOON TO RESCHEDULE YOUR PHYSICAL) 06/22/22   Dale [place], MD  furosemide (LASIX) 40 MG tablet Take 1 tablet by mouth daily. 01/19/21   [provider]  gabapentin (NEURONTIN) 100 MG capsule Take 100 mg by mouth daily. 01/14/20   [provider]  lidocaine (LIDODERM) 5 % Place 1 patch onto the skin every 12 (twelve) hours. Remove & Discard patch within 12 hours or as directed by MD 04/15/22 04/15/23  Delton [place], MD  lip balm (BLISTEX) OINT Apply 1 application topically as needed for lip care. 06/21/16   Alford Highland, MD  Multiple Vitamin (MULTIVITAMIN WITH MINERALS) TABS tablet Take 1 tablet by mouth daily.    [provider]  omeprazole (PRILOSEC) 20 MG capsule Take 1 capsule by mouth daily. 10/20/20   [provider]  predniSONE (DELTASONE) 5 MG tablet Take 5 mg by mouth daily. 05/20/21   [provider]   Probiotic Product (ALIGN PO) Take 1 tablet by mouth daily.    [provider]  spironolactone (ALDACTONE) 25 MG tablet Take 12.5 mg by mouth daily. 08/05/20   [provider]  traMADol (ULTRAM) 50 MG tablet Take 50 mg by mouth 2 (two) times daily as needed. 11/13/20   [provider]    Physical Exam: Vitals:   11/29/22 1256 11/29/22 1314 11/29/22 2220  BP:  (!) 115/53 137/86  Pulse:  85 100  Resp:  16 20  Temp:  98.3 F (  36.8 C) 100.3 F (37.9 C)  TempSrc:  Oral Oral  SpO2:  95% 95%  Weight: 54.4 kg    Height: 5\' 5"  (1.651 m)      Constitutional: NAD, calm, comfortable Vitals:   11/29/22 1256 11/29/22 1314 11/29/22 2220  BP:  (!) 115/53 137/86  Pulse:  85 100  Resp:  16 20  Temp:  98.3 F (36.8 C) 100.3 F (37.9 C)  TempSrc:  Oral Oral  SpO2:  95% 95%  Weight: 54.4 kg    Height: 5\' 5"  (1.651 m)     Eyes: PERRL, lids and conjunctivae normal ENMT: Mucous membranes are moist. Posterior pharynx clear of any exudate or lesions.Normal dentition.  Neck: normal, supple, no masses, no thyromegaly Respiratory: clear to auscultation bilaterally, no wheezing, no crackles. Normal respiratory effort. No accessory muscle use.  Cardiovascular: Regular rate and rhythm, no murmurs / rubs / gallops. No extremity edema. 2+ pedal pulses. No carotid bruits.  Abdomen: no tenderness, no masses palpated. No hepatosplenomegaly. Bowel sounds positive.  Musculoskeletal: no clubbing / cyanosis. Swelling of right knee  noted effusion , decrease rom due to pain , left w/o effusion or tenderness, no contractures. Normal muscle tone.  Skin: no rashes, lesions, ulcers. No induration Neurologic: CN 2-12 grossly intact. Sensation intact,  Strength 5/5 in all 4.  Psychiatric: Normal judgment and insight. Alert and oriented x 3. Normal mood.    Labs on Admission: I have personally reviewed following labs and imaging studies  CBC: Recent Labs  Lab 11/29/22 1500  WBC 13.9*   NEUTROABS 10.5*  HGB 14.1  HCT 41.2  MCV 93.0  PLT 243   Basic Metabolic Panel: Recent Labs  Lab 11/29/22 1500  NA 131*  K 4.7  CL 95*  CO2 25  GLUCOSE 136*  BUN 14  CREATININE 1.04*  CALCIUM 9.4   GFR: Estimated Creatinine Clearance: 37.7 mL/min (A) (by C-G formula based on SCr of 1.04 mg/dL (H)). Liver Function Tests: No results for input(s): "AST", "ALT", "ALKPHOS", "BILITOT", "PROT", "ALBUMIN" in the last 168 hours. No results for input(s): "LIPASE", "AMYLASE" in the last 168 hours. No results for input(s): "AMMONIA" in the last 168 hours. Coagulation Profile: No results for input(s): "INR", "PROTIME" in the last 168 hours. Cardiac Enzymes: No results for input(s): "CKTOTAL", "CKMB", "CKMBINDEX", "TROPONINI" in the last 168 hours. BNP (last 3 results) No results for input(s): "PROBNP" in the last 8760 hours. HbA1C: No results for input(s): "HGBA1C" in the last 72 hours. CBG: No results for input(s): "GLUCAP" in the last 168 hours. Lipid Profile: No results for input(s): "CHOL", "HDL", "LDLCALC", "TRIG", "CHOLHDL", "LDLDIRECT" in the last 72 hours. Thyroid Function Tests: No results for input(s): "TSH", "T4TOTAL", "FREET4", "T3FREE", "THYROIDAB" in the last 72 hours. Anemia Panel: No results for input(s): "VITAMINB12", "FOLATE", "FERRITIN", "TIBC", "IRON", "RETICCTPCT" in the last 72 hours. Urine analysis:    Component Value Date/Time   COLORURINE YELLOW 03/28/2022 1512   APPEARANCEUR CLEAR 03/28/2022 1512   APPEARANCEUR Clear 11/02/2014 0225   LABSPEC <=1.005 (A) 03/28/2022 1512   LABSPEC 1.010 11/02/2014 0225   PHURINE 7.0 03/28/2022 1512   GLUCOSEU NEGATIVE 03/28/2022 1512   HGBUR NEGATIVE 03/28/2022 1512   BILIRUBINUR NEGATIVE 03/28/2022 1512   BILIRUBINUR Negative 11/02/2014 0225   KETONESUR NEGATIVE 03/28/2022 1512   PROTEINUR NEGATIVE 02/27/2019 0355   UROBILINOGEN 0.2 03/28/2022 1512   NITRITE NEGATIVE 03/28/2022 1512   LEUKOCYTESUR NEGATIVE  03/28/2022 1512   LEUKOCYTESUR Negative 11/02/2014 0225    Radiological  Exams on Admission: DG Knee Complete 4 Views Right  Result Date: 11/29/2022 CLINICAL DATA:  Bilateral knee pain. EXAM: RIGHT KNEE - COMPLETE 4+ VIEW COMPARISON:  February 27, 2019. FINDINGS: No definite fracture or dislocation is noted. Large suprapatellar joint effusion is noted. 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 IMPRESSION: Large suprapatellar joint effusion. Mild degenerative change is noted medially. No fracture or dislocation. Electronically Signed   By: Lupita Raider M.D.   On: 11/29/2022 14:36    EKG: Independently reviewed. N/a  Assessment/Plan       Acute Exacerbation of Inflammatory Arthritis r/o  septic joint -hx of RA , notes similar symptoms in past but more severe  - no fever no chills,  marginal crp/esr  38000 wbc on joint asp , gram stain neg for org,  - gram stain/ culture pending of joint aspiration  - broad spectrum abx at recommendation of ortho until cultures return  - supportive care  - f/u ortho final recs  - if cultures negative would start steroids for inflammatory a flare    HLD -resume statin   HTN  -continue ccb   GERD -ppi    RA - on infusion q 2 months   Valvular heart disease stable -no active issue    Hx of Afib -not on OAC  Hx of OSA  - qhs O2   Depression  Resume home regimen     DVT prophylaxis: heparin Code Status: full/ as discussed per patient wishes in event of cardiac arrest  Family Communication: none at bedside Disposition Plan: patient  expected to be admitted greater than 2 midnights  Consults called:  Ortho  Admission status:med/surg /monitor    Lurline Del MD [facility]   If 7PM-7AM, please contact night-coverage [email or web address] Password [facility]  11/29/2022, 10:35 PM

## 2022-11-30 ENCOUNTER — Inpatient Hospital Stay: Payer: Medicare Other | Admitting: Anesthesiology

## 2022-11-30 ENCOUNTER — Other Ambulatory Visit: Payer: Self-pay

## 2022-11-30 ENCOUNTER — Encounter
Admission: EM | Disposition: A | Discharge status: Home Health | Payer: Self-pay | Source: Home / Self Care | Attending: Internal Medicine

## 2022-11-30 ENCOUNTER — Encounter: Payer: Self-pay | Admitting: Internal Medicine

## 2022-11-30 DIAGNOSIS — M069 Rheumatoid arthritis, unspecified: Secondary | ICD-10-CM | POA: Diagnosis not present

## 2022-11-30 DIAGNOSIS — G4733 Obstructive sleep apnea (adult) (pediatric): Secondary | ICD-10-CM

## 2022-11-30 DIAGNOSIS — E7800 Pure hypercholesterolemia, unspecified: Secondary | ICD-10-CM

## 2022-11-30 DIAGNOSIS — I10 Essential (primary) hypertension: Secondary | ICD-10-CM | POA: Diagnosis not present

## 2022-11-30 DIAGNOSIS — T8453XA Infection and inflammatory reaction due to internal right knee prosthesis, initial encounter: Secondary | ICD-10-CM | POA: Diagnosis not present

## 2022-11-30 DIAGNOSIS — M25561 Pain in right knee: Secondary | ICD-10-CM

## 2022-11-30 DIAGNOSIS — E871 Hypo-osmolality and hyponatremia: Secondary | ICD-10-CM | POA: Diagnosis not present

## 2022-11-30 DIAGNOSIS — I4891 Unspecified atrial fibrillation: Secondary | ICD-10-CM | POA: Diagnosis not present

## 2022-11-30 DIAGNOSIS — K219 Gastro-esophageal reflux disease without esophagitis: Secondary | ICD-10-CM

## 2022-11-30 HISTORY — PX: IRRIGATION AND DEBRIDEMENT KNEE: SHX5185

## 2022-11-30 LAB — SYNOVIAL CELL COUNT + DIFF, W/ CRYSTALS
Crystals, Fluid: NONE SEEN
Lymphocytes-Synovial Fld: 0 %
Monocyte-Macrophage-Synovial Fluid: 28 %
Neutrophil, Synovial: 72 %
WBC, Synovial: 21423 /mm3 — ABNORMAL HIGH (ref 0–200)

## 2022-11-30 LAB — CBG MONITORING, ED: Glucose-Capillary: 130 mg/dL — ABNORMAL HIGH (ref 70–99)

## 2022-11-30 LAB — CBC WITH DIFFERENTIAL/PLATELET
Abs Immature Granulocytes: 0.04 10*3/uL (ref 0.00–0.07)
Basophils Absolute: 0 10*3/uL (ref 0.0–0.1)
Basophils Relative: 0 %
Eosinophils Absolute: 0 10*3/uL (ref 0.0–0.5)
Eosinophils Relative: 0 %
HCT: 39.1 % (ref 36.0–46.0)
Hemoglobin: 13.5 g/dL (ref 12.0–15.0)
Immature Granulocytes: 0 %
Lymphocytes Relative: 12 %
Lymphs Abs: 1.8 10*3/uL (ref 0.7–4.0)
MCH: 32.1 pg (ref 26.0–34.0)
MCHC: 34.5 g/dL (ref 30.0–36.0)
MCV: 92.9 fL (ref 80.0–100.0)
Monocytes Absolute: 1.7 10*3/uL — ABNORMAL HIGH (ref 0.1–1.0)
Monocytes Relative: 11 %
Neutro Abs: 11.7 10*3/uL — ABNORMAL HIGH (ref 1.7–7.7)
Neutrophils Relative %: 77 %
Platelets: 239 10*3/uL (ref 150–400)
RBC: 4.21 MIL/uL (ref 3.87–5.11)
RDW: 13.1 % (ref 11.5–15.5)
WBC: 15.3 10*3/uL — ABNORMAL HIGH (ref 4.0–10.5)
nRBC: 0 % (ref 0.0–0.2)

## 2022-11-30 LAB — CREATININE, SERUM
Creatinine, Ser: 0.94 mg/dL (ref 0.44–1.00)
GFR, Estimated: >60 mL/min (ref >60)

## 2022-11-30 LAB — GRAM STAIN

## 2022-11-30 SURGERY — IRRIGATION AND DEBRIDEMENT KNEE
Anesthesia: General | Site: Knee | Laterality: Right

## 2022-11-30 MED ORDER — ALBUTEROL SULFATE HFA 108 (90 BASE) MCG/ACT IN AERS: 2.0000 | INHALATION_SPRAY | Freq: Four times a day (QID) | RESPIRATORY_TRACT | Status: DC | PRN

## 2022-11-30 MED ORDER — ACETAMINOPHEN 10 MG/ML IV SOLN: 1000.0000 mg | Freq: Once | INTRAVENOUS | Status: DC | PRN

## 2022-11-30 MED ORDER — EPHEDRINE SULFATE (PRESSORS) 50 MG/ML IJ SOLN
INTRAMUSCULAR | Status: DC | PRN
  Administered 2022-11-30 (×2): 5 mg via INTRAVENOUS

## 2022-11-30 MED ORDER — HEPARIN SODIUM (PORCINE) 5000 UNIT/ML IJ SOLN
5000.0000 [IU] | Freq: Three times a day (TID) | INTRAMUSCULAR | Status: DC
  Administered 2022-11-30 – 2022-12-04 (×12): 5000 [IU] via SUBCUTANEOUS
  Filled 2022-11-30 (×12): qty 1

## 2022-11-30 MED ORDER — ADULT MULTIVITAMIN W/MINERALS CH
1.0000 | ORAL_TABLET | Freq: Every day | ORAL | Status: DC
  Administered 2022-11-30 – 2022-12-04 (×5): 1 via ORAL
  Filled 2022-11-30 (×5): qty 1

## 2022-11-30 MED ORDER — LIDOCAINE HCL (PF) 1 % IJ SOLN
INTRAMUSCULAR | Status: AC
  Filled 2022-11-30: qty 30

## 2022-11-30 MED ORDER — ALPRAZOLAM 0.25 MG PO TABS
0.2500 mg | ORAL_TABLET | Freq: Two times a day (BID) | ORAL | Status: DC | PRN
  Administered 2022-12-04: 0.25 mg via ORAL
  Filled 2022-11-30: qty 1

## 2022-11-30 MED ORDER — RINGERS IRRIGATION IR SOLN
Status: DC | PRN
  Administered 2022-11-30 (×3): 3000 mL

## 2022-11-30 MED ORDER — MAGNESIUM SULFATE 2 GM/50ML IV SOLN
2.0000 g | Freq: Once | INTRAVENOUS | Status: AC
  Administered 2022-11-30: 2 g via INTRAVENOUS
  Filled 2022-11-30: qty 50

## 2022-11-30 MED ORDER — PANTOPRAZOLE SODIUM 40 MG PO TBEC
40.0000 mg | DELAYED_RELEASE_TABLET | Freq: Every day | ORAL | Status: DC
  Administered 2022-11-30 – 2022-12-04 (×5): 40 mg via ORAL
  Filled 2022-11-30 (×5): qty 1

## 2022-11-30 MED ORDER — LIDOCAINE HCL 1 % IJ SOLN: INTRAMUSCULAR | Status: DC | PRN

## 2022-11-30 MED ORDER — DEXAMETHASONE SODIUM PHOSPHATE 10 MG/ML IJ SOLN
INTRAMUSCULAR | Status: DC | PRN
  Administered 2022-11-30: 5 mg via INTRAVENOUS

## 2022-11-30 MED ORDER — FENTANYL CITRATE (PF) 100 MCG/2ML IJ SOLN
INTRAMUSCULAR | Status: AC
  Filled 2022-11-30: qty 2

## 2022-11-30 MED ORDER — LIDOCAINE HCL (CARDIAC) PF 100 MG/5ML IV SOSY
PREFILLED_SYRINGE | INTRAVENOUS | Status: DC | PRN
  Administered 2022-11-30: 60 mg via INTRAVENOUS

## 2022-11-30 MED ORDER — LORATADINE 10 MG PO TABS
10.0000 mg | ORAL_TABLET | Freq: Every day | ORAL | Status: DC
  Administered 2022-11-30 – 2022-12-04 (×5): 10 mg via ORAL
  Filled 2022-11-30 (×5): qty 1

## 2022-11-30 MED ORDER — VANCOMYCIN HCL 1250 MG/250ML IV SOLN
1250.0000 mg | Freq: Once | INTRAVENOUS | Status: AC
  Administered 2022-11-30: 1250 mg via INTRAVENOUS
  Filled 2022-11-30 (×2): qty 250

## 2022-11-30 MED ORDER — BUPIVACAINE HCL (PF) 0.25 % IJ SOLN
INTRAMUSCULAR | Status: AC
  Filled 2022-11-30: qty 30

## 2022-11-30 MED ORDER — PHENYLEPHRINE 80 MCG/ML (10ML) SYRINGE FOR IV PUSH (FOR BLOOD PRESSURE SUPPORT)
PREFILLED_SYRINGE | INTRAVENOUS | Status: DC | PRN
  Administered 2022-11-30 (×2): 160 ug via INTRAVENOUS

## 2022-11-30 MED ORDER — OXYCODONE HCL 5 MG/5ML PO SOLN: 5.0000 mg | Freq: Once | ORAL | Status: DC | PRN

## 2022-11-30 MED ORDER — GLYCOPYRROLATE 0.2 MG/ML IJ SOLN
INTRAMUSCULAR | Status: DC | PRN
  Administered 2022-11-30: .1 mg via INTRAVENOUS

## 2022-11-30 MED ORDER — FLUTICASONE PROPIONATE 50 MCG/ACT NA SUSP
1.0000 | Freq: Every day | NASAL | Status: DC
  Administered 2022-11-30 – 2022-12-04 (×4): 1 via NASAL
  Filled 2022-11-30 (×2): qty 16

## 2022-11-30 MED ORDER — SPIRONOLACTONE 12.5 MG HALF TABLET
12.5000 mg | ORAL_TABLET | Freq: Every day | ORAL | Status: DC
  Administered 2022-11-30 – 2022-12-04 (×5): 12.5 mg via ORAL
  Filled 2022-11-30 (×5): qty 1

## 2022-11-30 MED ORDER — GABAPENTIN 100 MG PO CAPS
100.0000 mg | ORAL_CAPSULE | Freq: Every day | ORAL | Status: DC
  Administered 2022-11-30 – 2022-12-04 (×5): 100 mg via ORAL
  Filled 2022-11-30 (×5): qty 1

## 2022-11-30 MED ORDER — FENTANYL CITRATE (PF) 100 MCG/2ML IJ SOLN
INTRAMUSCULAR | Status: DC | PRN
  Administered 2022-11-30: 25 ug via INTRAVENOUS
  Administered 2022-11-30: 50 ug via INTRAVENOUS
  Administered 2022-11-30: 25 ug via INTRAVENOUS

## 2022-11-30 MED ORDER — ONDANSETRON HCL 4 MG/2ML IJ SOLN
INTRAMUSCULAR | Status: DC | PRN
  Administered 2022-11-30: 4 mg via INTRAVENOUS

## 2022-11-30 MED ORDER — ONDANSETRON HCL 4 MG/2ML IJ SOLN: 4.0000 mg | Freq: Once | INTRAMUSCULAR | Status: DC | PRN

## 2022-11-30 MED ORDER — LACTATED RINGERS IV SOLN: INTRAVENOUS | Status: DC | PRN

## 2022-11-30 MED ORDER — HYDROCODONE-ACETAMINOPHEN 5-325 MG PO TABS
1.0000 | ORAL_TABLET | ORAL | Status: DC | PRN
  Administered 2022-11-30 – 2022-12-03 (×6): 1 via ORAL
  Filled 2022-11-30 (×5): qty 1

## 2022-11-30 MED ORDER — SODIUM CHLORIDE 0.9 % IV SOLN
1.0000 g | Freq: Three times a day (TID) | INTRAVENOUS | Status: DC
  Administered 2022-11-30: 1 g via INTRAVENOUS
  Filled 2022-11-30 (×2): qty 5

## 2022-11-30 MED ORDER — DILTIAZEM HCL ER COATED BEADS 180 MG PO CP24
180.0000 mg | ORAL_CAPSULE | Freq: Every day | ORAL | Status: DC
  Administered 2022-11-30 – 2022-12-04 (×5): 180 mg via ORAL
  Filled 2022-11-30 (×5): qty 1

## 2022-11-30 MED ORDER — LACTATED RINGERS IV SOLN: INTRAVENOUS | Status: DC

## 2022-11-30 MED ORDER — FENTANYL CITRATE (PF) 100 MCG/2ML IJ SOLN: 25.0000 ug | INTRAMUSCULAR | Status: DC | PRN

## 2022-11-30 MED ORDER — ATORVASTATIN CALCIUM 20 MG PO TABS
20.0000 mg | ORAL_TABLET | Freq: Every day | ORAL | Status: DC
  Administered 2022-11-30 – 2022-12-04 (×5): 20 mg via ORAL
  Filled 2022-11-30 (×5): qty 1

## 2022-11-30 MED ORDER — SODIUM CHLORIDE 0.9 % IV SOLN
2.0000 g | INTRAVENOUS | Status: DC
  Administered 2022-11-30 – 2022-12-02 (×3): 2 g via INTRAVENOUS
  Filled 2022-11-30 (×4): qty 20

## 2022-11-30 MED ORDER — SUGAMMADEX SODIUM 200 MG/2ML IV SOLN
INTRAVENOUS | Status: DC | PRN
  Administered 2022-11-30: 200 mg via INTRAVENOUS

## 2022-11-30 MED ORDER — EPINEPHRINE PF 1 MG/ML IJ SOLN
INTRAMUSCULAR | Status: AC
  Filled 2022-11-30: qty 1

## 2022-11-30 MED ORDER — DILTIAZEM HCL 30 MG PO TABS
30.0000 mg | ORAL_TABLET | Freq: Once | ORAL | Status: AC
  Administered 2022-11-30: 30 mg via ORAL
  Filled 2022-11-30: qty 1

## 2022-11-30 MED ORDER — SODIUM CHLORIDE 0.9 % IV BOLUS
500.0000 mL | Freq: Once | INTRAVENOUS | Status: AC
  Administered 2022-11-30: 500 mL via INTRAVENOUS

## 2022-11-30 MED ORDER — COLESTIPOL HCL 1 G PO TABS
2.0000 g | ORAL_TABLET | Freq: Every day | ORAL | Status: DC
  Administered 2022-11-30 – 2022-12-04 (×3): 2 g via ORAL
  Filled 2022-11-30 (×5): qty 2

## 2022-11-30 MED ORDER — ONDANSETRON HCL 4 MG/2ML IJ SOLN: 4.0000 mg | Freq: Four times a day (QID) | INTRAMUSCULAR | Status: DC | PRN

## 2022-11-30 MED ORDER — OXYCODONE HCL 5 MG PO TABS: 5.0000 mg | ORAL_TABLET | Freq: Once | ORAL | Status: DC | PRN

## 2022-11-30 MED ORDER — ROCURONIUM BROMIDE 100 MG/10ML IV SOLN
INTRAVENOUS | Status: DC | PRN
  Administered 2022-11-30: 20 mg via INTRAVENOUS

## 2022-11-30 MED ORDER — TRAMADOL HCL 50 MG PO TABS
50.0000 mg | ORAL_TABLET | Freq: Four times a day (QID) | ORAL | Status: DC | PRN
  Administered 2022-12-01 – 2022-12-03 (×2): 50 mg via ORAL
  Filled 2022-11-30 (×2): qty 1

## 2022-11-30 MED ORDER — LEVALBUTEROL HCL 0.63 MG/3ML IN NEBU: 0.6300 mg | INHALATION_SOLUTION | Freq: Four times a day (QID) | RESPIRATORY_TRACT | Status: DC | PRN

## 2022-11-30 MED ORDER — VANCOMYCIN HCL 750 MG/150ML IV SOLN
750.0000 mg | INTRAVENOUS | Status: DC
  Administered 2022-12-01: 750 mg via INTRAVENOUS
  Filled 2022-11-30 (×2): qty 150

## 2022-11-30 MED ORDER — HYDROCODONE-ACETAMINOPHEN 5-325 MG PO TABS
ORAL_TABLET | ORAL | Status: AC
  Filled 2022-11-30: qty 1

## 2022-11-30 MED ORDER — LIDOCAINE HCL 1 % IJ SOLN
INTRAMUSCULAR | Status: DC | PRN
  Administered 2022-11-30: 4 mL

## 2022-11-30 MED ORDER — ONDANSETRON HCL 4 MG PO TABS: 4.0000 mg | ORAL_TABLET | Freq: Four times a day (QID) | ORAL | Status: DC | PRN

## 2022-11-30 MED ORDER — PROPOFOL 10 MG/ML IV BOLUS
INTRAVENOUS | Status: DC | PRN
  Administered 2022-11-30: 80 mg via INTRAVENOUS

## 2022-11-30 SURGICAL SUPPLY — 52 items
ADAPTER IRRIG TUBE 2 SPIKE SOL (ADAPTER) ×2 IMPLANT
ADPR TBG 2 SPK PMP STRL ASCP (ADAPTER) ×2
BLADE FULL RADIUS 3.5 (BLADE) IMPLANT
BLADE SHAVER 4.5X7 STR FR (MISCELLANEOUS) IMPLANT
BUR BR 5.5 WIDE MOUTH (BURR) IMPLANT
CNTNR URN SCR LID CUP LEK RST (MISCELLANEOUS) IMPLANT
CONT SPEC 4OZ STRL OR WHT (MISCELLANEOUS)
COOLER POLAR GLACIER W/PUMP (MISCELLANEOUS) IMPLANT
CUFF TOURN SGL QUICK 24 (TOURNIQUET CUFF)
CUFF TOURN SGL QUICK 34 (TOURNIQUET CUFF)
CUFF TRNQT CYL 24X4X16.5-23 (TOURNIQUET CUFF) IMPLANT
CUFF TRNQT CYL 34X4.125X (TOURNIQUET CUFF) IMPLANT
DEVICE SUCT BLK HOLE OR FLOOR (MISCELLANEOUS) IMPLANT
DRAPE ARTHRO LIMB 89X125 STRL (DRAPES) ×1 IMPLANT
DRAPE IMP U-DRAPE 54X76 (DRAPES) ×1 IMPLANT
DURAPREP 26ML APPLICATOR (WOUND CARE) ×3 IMPLANT
GAUZE SPONGE 4X4 12PLY STRL (GAUZE/BANDAGES/DRESSINGS) ×1 IMPLANT
GAUZE XEROFORM 1X8 LF (GAUZE/BANDAGES/DRESSINGS) ×1 IMPLANT
GLOVE BIOGEL PI IND STRL 9 (GLOVE) ×1 IMPLANT
GLOVE BIOGEL PI ORTHO SZ9 (GLOVE) ×4 IMPLANT
GOWN STRL REUS TWL 2XL XL LVL4 (GOWN DISPOSABLE) ×1 IMPLANT
GOWN STRL REUS W/ TWL LRG LVL3 (GOWN DISPOSABLE) ×1 IMPLANT
GOWN STRL REUS W/TWL LRG LVL3 (GOWN DISPOSABLE) ×1
IV LACTATED RINGER IRRG 3000ML (IV SOLUTION) ×3
IV LR IRRIG 3000ML ARTHROMATIC (IV SOLUTION) ×4 IMPLANT
KIT TURNOVER KIT A (KITS) ×1 IMPLANT
MANIFOLD NEPTUNE II (INSTRUMENTS) ×2 IMPLANT
MAT ABSORB  FLUID 56X50 GRAY (MISCELLANEOUS) ×2
MAT ABSORB FLUID 56X50 GRAY (MISCELLANEOUS) ×2 IMPLANT
NDL HYPO 22X1.5 SAFETY MO (MISCELLANEOUS) ×1 IMPLANT
NEEDLE HYPO 22X1.5 SAFETY MO (MISCELLANEOUS) ×1 IMPLANT
PACK ARTHROSCOPY KNEE (MISCELLANEOUS) ×1 IMPLANT
PAD ABD DERMACEA PRESS 5X9 (GAUZE/BANDAGES/DRESSINGS) ×2 IMPLANT
PAD WRAPON POLAR KNEE (MISCELLANEOUS) ×1 IMPLANT
SHAVER BLADE BONE CUTTER  5.5 (BLADE)
SHAVER BLADE BONE CUTTER 4.5 (BLADE) ×1 IMPLANT
SHAVER BLADE BONE CUTTER 5.5 (BLADE) IMPLANT
SHAVER BLADE TAPERED BLUNT 4 (BLADE) ×1 IMPLANT
SLEEVE REMOTE CONTROL 5X12 (DRAPES) IMPLANT
SOL PREP PVP 2OZ (MISCELLANEOUS)
SOLUTION PREP PVP 2OZ (MISCELLANEOUS) IMPLANT
SPONGE T-LAP 18X18 ~~LOC~~+RFID (SPONGE) ×1 IMPLANT
STRIP CLOSURE SKIN 1/2X4 (GAUZE/BANDAGES/DRESSINGS) ×1 IMPLANT
SUT ETHILON 4-0 (SUTURE) ×1
SUT ETHILON 4-0 FS2 18XMFL BLK (SUTURE) ×1
SUTURE ETHLN 4-0 FS2 18XMF BLK (SUTURE) ×1 IMPLANT
TRAP FLUID SMOKE EVACUATOR (MISCELLANEOUS) ×1 IMPLANT
TUBE SET DOUBLEFLO INFLOW (TUBING) ×1 IMPLANT
TUBE SET DOUBLEFLO OUTFLOW (TUBING) ×1 IMPLANT
WAND WEREWOLF FLOW 90D (MISCELLANEOUS) ×1 IMPLANT
WATER STERILE IRR 500ML POUR (IV SOLUTION) ×1 IMPLANT
WRAPON POLAR PAD KNEE (MISCELLANEOUS) ×1

## 2022-11-30 NOTE — Assessment & Plan Note (Addendum)
Sodium dropped down to 124.  Likely secondary to not eating very well.  Patient advised to eat.  Patient's daughter states that she will eat better at home.  In the meantime I increased her salt tablets to 3 times a day dosing and will give low-dose Lasix and Ensure.  Recommend checking a BMP and follow-up appointment.

## 2022-11-30 NOTE — Progress Notes (Signed)
Progress Note   Patient: Kaitlyn Huff:[id number] DOB: 05-Nov-1940 DOA: 11/29/2022     1 DOS: the patient was seen and examined on 11/30/2022   Brief hospital course: 82 y.o. female with medical history significant of   HLD, HTN , RA on infusion q 2 months, valvular heart disease stable, GERD,  who presents to ED  with one day  swelling and pain  of right knee. She notes no associated fever/chills / sob/ chest pain / n/v/ or abdominal pain. Patient notes current symptoms are similar to her typical flare but more painful.  Patient denies any fall or injury. Patient states she noted symptoms on awaking this am. She also note she is unable to walk due to pain. She states she had a near fall prior to presenting to ED.  She also notes pain in her left knee but states it is not as severe.   5/22.  Right knee aspiration does not show any organisms or crystals.  White blood cell count 38 335 and fluid with 80% neutrophils.  Patient will be brought to the operating room today by Dr. Martha Clan.    Assessment and Plan: * Acute pain of right knee With joint effusion.  Currently no organisms or crystals seen on aspiration.  44,010 white blood cells on aspiration.  Patient to go to the operating room today to clean out the right knee just in case infection.  Continue empiric antibiotics with Rocephin and vancomycin.  Rheumatoid arthritis (HCC) Pain bilateral knees.  Right knee effusion.  Hypertension Continue Cardizem CD and spironolactone  Hyponatremia Sodium 131  Hypercholesterolemia Continue statin  Obstructive sleep apnea On oxygen at night  GERD (gastroesophageal reflux disease) On PPI        Subjective: Patient with pain and difficulty moving her right knee.  History of rheumatoid arthritis.  Had aspiration in the emergency room.  Started on empiric antibiotics.  Physical Exam: Vitals:   11/30/22 0830 11/30/22 1030 11/30/22 1330 11/30/22 1401  BP: 133/78 126/73 133/63 (!)  115/59  Pulse: (!) 54 92 79 76  Resp: (!) 22 19 (!) 21 16  Temp:    98.4 F (36.9 C)  TempSrc:    Oral  SpO2: 95% 96% 93% 94%  Weight:    54.4 kg  Height:    5\' 5"  (1.651 m)   Physical Exam HENT:     Head: Normocephalic.     Mouth/Throat:     Pharynx: No oropharyngeal exudate.  Eyes:     General: Lids are normal.     Conjunctiva/sclera: Conjunctivae normal.  Cardiovascular:     Rate and Rhythm: Normal rate and regular rhythm.     Heart sounds: Normal heart sounds, S1 normal and S2 normal.  Pulmonary:     Breath sounds: No decreased breath sounds, wheezing, rhonchi or rales.  Abdominal:     Palpations: Abdomen is soft.     Tenderness: There is no abdominal tenderness.  Musculoskeletal:     Right knee: Swelling present. Decreased range of motion.  Skin:    General: Skin is warm.     Findings: No rash.  Neurological:     Mental Status: She is alert and oriented to person, place, and time.     Data Reviewed: White blood cell count 15.3, creatinine 0.94, sodium 131  Family Communication: Since going to the operating room today, Dr. Martha Clan called the family.  Disposition: Status is: Inpatient Remains inpatient appropriate because: Going to the operating room today  Planned Discharge Destination: Home    Time spent: 28 minutes  Author: Alford Highland, MD 11/30/2022 2:08 PM  For on call review [email or web address].

## 2022-11-30 NOTE — ED Notes (Signed)
Per dr. Dario Ave, pt is npo at this time.

## 2022-11-30 NOTE — Progress Notes (Addendum)
Pharmacy Antibiotic Note  Kaitlyn Huff is a 82 y.o. female admitted on 11/29/2022 with  septic joint .  Pharmacy has been consulted for Vancomycin dosing.  Today, 11/30/2022 Day #1 antibiotics - ceftriaxone (vancomycin yet to be given - see below) Renal: SCr 0.94 WBC 15.3 Tm/24h 100.3 Knee aspiration: 38K WBC, 80% neutrophils and no crystals Orthopedic surgery took for washout of knee 5/22 Patient with documented amoxicillin allergy (documented as rash) but has tolerated IV and PO cephalosporins on multiple occasions **5/22 vancomycin has not been given - clarified with RN working last night that the 1250mg  dose x 1 was not given.  She was instructed by PA to hold vancomycin until evaluated by hospitalist.  This was relayed to next shift RN.  Vancomycin never given   Plan: Retime the Vancomycin 1250 mg IV X 1 this evening after OR washout, then Vancomycin 750 mg IV Q24H ordered to start on 5/23 @ 1400. Estimated AUC = 491 (goall 400-600) Using SCr 0.94 and TBW to calculate Ke, CrCL as weight less than IBW Estimated Vanc trough = 12.9 Follow cultures Follow renal function and check levels if remains on vancomycin > 4-5 days (soon if significant change in renal function )   Height: 5\' 5"  (165.1 cm) Weight: 54.4 kg (119 lb 14.9 oz) IBW/kg (Calculated) : 57  Temp (24hrs), Avg:99.5 F (37.5 C), Min:98.4 F (36.9 C), Max:100.3 F (37.9 C)  Recent Labs  Lab 11/29/22 1500 11/29/22 2210 11/29/22 2337 11/30/22 0547  WBC 13.9*  --   --  15.3*  CREATININE 1.04*  --   --  0.94  LATICACIDVEN  --  1.9 1.4  --      Estimated Creatinine Clearance: 41.7 mL/min (by C-G formula based on SCr of 0.94 mg/dL).    Allergies  Allergen Reactions   Astelin [Azelastine Hcl] Other (See Comments)    Reaction:  Unknown    Codeine Other (See Comments)    Reaction:  Altered mental status   Dilaudid [Hydromorphone] Other (See Comments)    Reaction:  Unknown    Flexeril [Cyclobenzaprine] Other (See  Comments)    Reaction:  Unknown    Imuran [Azathioprine] Other (See Comments)    Reaction:  Abnormal liver function   Lisinopril Itching   Lyrica [Pregabalin] Other (See Comments)    Reaction:  Sore gums    Methotrexate Derivatives Other (See Comments)    Reaction:  Abnormal liver function   Amiodarone Anxiety and Other (See Comments)    Pt could not eat or sleep. Caused n/v and "messed her up"   Amoxicillin Rash and Other (See Comments)    Patient has tolerated multiple cepalosporins Unable to obtain enough information to answer additional questions about this medication.     Arava [Leflunomide] Rash   Clindamycin/Lincomycin Rash   Doxycycline Rash   Lodine [Etodolac] Rash   Percocet [Oxycodone-Acetaminophen] Rash   Sulfa Antibiotics Rash    Antimicrobials this admission:  5/22 aztreonam x1 5/22 ceftriaxone >>   5/22 vancomycin >>   Dose adjustments this admission:   Microbiology results: 5/21 BCx: NGTD 5/21 synovial knee fluid: NGTD 5/22 OR culture of knee:   Thank you for allowing pharmacy to be a part of this patient's care.  Juliette Alcide, PharmD, BCPS, BCIDP Work Cell: [phone] 11/30/2022 3:31 PM

## 2022-11-30 NOTE — ED Notes (Signed)
Family at bedside. 

## 2022-11-30 NOTE — Op Note (Signed)
11/29/2022 - 11/30/2022  3:54 PM  PATIENT:  Kaitlyn Huff    PRE-OPERATIVE DIAGNOSIS:  Right knee swelling with possible septic joint  POST-OPERATIVE DIAGNOSIS:  Same  PROCEDURE: Right knee arthroscopic irrigation and debridement with partial medial meniscectomy and extensive synovectomy  SURGEON:  Juanell Fairly, MD  ANESTHESIA:   General  SPECIMENS: Synovial fluid sent for anaerobic and aerobic culture, Gram stain, and cell count.  Synovial tissue with calcification sent for pathology  PREOPERATIVE INDICATIONS:  Kaitlyn Huff is a  82 y.o. female with a diagnosis of right knee effusion with possible right knee septic arthritis.  Patient was recommended for an arthroscopic irrigation and debridement after a right knee aspiration in the ER showed  I discussed the risks and benefits of surgery. The risks include but are not limited to infection, bleeding requiring blood transfusion, nerve or blood vessel injury, joint stiffness or loss of motion, persistent pain, weakness or instability, malunion, nonunion and hardware failure and the need for further surgery. Medical risks include but are not limited to DVT and pulmonary embolism, myocardial infarction, stroke, pneumonia, respiratory failure and death. Patient understood these risks and wished to proceed.   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  Patient had cloudy synovial fluid.  There was calcification seen in the soft tissues of the right knee.  Patient had a degenerative medial meniscus tear.  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  OPERATIVE PROCEDURE: Patient was met in the preoperative area with her family at the bedside.  The right knee was marked according to hospital's quickset of surgery protocol.  I discussed the details of the operation as well as the postoperative  course with the patient and her family who are in agreement with the plan for arthroscopic washout of the right knee.  Patient was brought to the operating room where she was placed supine on the operative table and underwent general anesthesia with an LMA.  Patient had a right knee prepped and draped in a sterile fashion with a tourniquet over the right thigh which was not inflated during the case.  Timeout was performed to verify the patient's name, date of birth, medical record number, correct site of surgery and correct procedure to be performed.  Once all in attendance were in agreement the case began.  She had previously received IV antibiotics on the floor is not given additional antibiotics for this case.  Patient was preinjected with 1% lidocaine plain in the proposed anterior medial and lateral portal positions.  11 blade used to clear 8 a lateral portal.  Synovial fluid was drained through the arthroscopic cannula and collected in a specimen cup and sent to microbiology for Gram stain, aerobic and anaerobic culture as well as cell count.  Fluid was cloudy yellowish synovial fluid with some sediment seen in it.    A full diagnostic examination of the knee was performed.  She had had significant synovitis throughout the knee which was treated with a 90 degree werewolf wand and tapered shaver blade.    Patient had a medial meniscus tear which was addressed with a straight biter and tapered shaver blade until a stable rim was performed. The tear involved the posterior horn and body. Chondroplasty of the medial femoral condyle was also performed.  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  There was fraying of the ACL fibers.  Patient also had a peripheral generative tear of the anterior lateral meniscus without significant instability that was not debrided.  Patient had calcification in the synovium.  Biopsy was taken of this and sent to pathology.  Chondroplasty was  performed of the trochlea to remove loose cartilage faps at the periphery of the lesion. The knee joint was then copiously irrigated.    The two portal incisions were closed with 4-0 nylon.  A dry sterile dressing was applied along with a Polar Care pad.  Patient was awoken and brought to the PACU in stable condition.  I scrubbed and present for the entire case and all sharp, sponge and instrument counts were correct at the conclusion the case.

## 2022-11-30 NOTE — Assessment & Plan Note (Signed)
On PPI

## 2022-11-30 NOTE — ED Notes (Signed)
Report to silvia, rn with same day surgery. Per silvia to go to or at 14:30

## 2022-11-30 NOTE — Anesthesia Postprocedure Evaluation (Signed)
Anesthesia Post Note  Patient: Ahniyah J Endo  Procedure(s) Performed: RIGHT KNEE ARTHROSCOPIC WASHOUT (Right: Knee)  Patient location during evaluation: PACU Anesthesia Type: General Level of consciousness: awake and alert Pain management: pain level controlled Vital Signs Assessment: post-procedure vital signs reviewed and stable Respiratory status: spontaneous breathing, nonlabored ventilation, respiratory function stable and patient connected to nasal cannula oxygen Cardiovascular status: blood pressure returned to baseline and stable Postop Assessment: no apparent nausea or vomiting Anesthetic complications: no Comments: Patient with a history of A Fib that she sees cardiology for. She is on diltiazem 30 q 6 at home. Will continue her home dose. Patient denies any symptoms of chest pain, shortness of breath, or flutters in her chest.  No notable events documented.   Last Vitals:  Vitals:   11/30/22 1552 11/30/22 1600  BP: 133/79 124/61  Pulse: 97 91  Resp: (!) 21 13  Temp: 37.3 C   SpO2: 94% 100%    Last Pain:  Vitals:   11/30/22 1552  TempSrc:   PainSc: Asleep                 Stephanie Coup

## 2022-11-30 NOTE — Hospital Course (Addendum)
82 y.o. female with medical history significant of   HLD, HTN , RA on infusion q 2 months, valvular heart disease stable, GERD,  who presents to ED  with one day  swelling and pain  of right knee. She notes no associated fever/chills / sob/ chest pain / n/v/ or abdominal pain. Patient notes current symptoms are similar to her typical flare but more painful.  Patient denies any fall or injury. Patient states she noted symptoms on awaking this am. She also note she is unable to walk due to pain. She states she had a near fall prior to presenting to ED.  She also notes pain in her left knee but states it is not as severe.   5/22.  Right knee aspiration does not show any organisms or crystals.  White blood cell count 38,335 and fluid with 80% neutrophils.  Patient will be brought to the operating room today by Dr. Martha Clan. Post-operatively went into afib. 5/23.  POD 1. Left knee pain.  Still in atrial fibrillation.  Had chest pain in the evening.  Cardiac enzymes flat.  Started on aspirin.  CT scan of the chest showed moderate hiatal hernia and no pulmonary embolism and coronary artery calcification. 5/24.  Patient complaining of left elbow pain.  Case discussed with ID and she was okay with me starting steroids.  Antibiotics switched over to Ancef for tomorrow.

## 2022-11-30 NOTE — Assessment & Plan Note (Signed)
On oxygen at night

## 2022-11-30 NOTE — ED Notes (Signed)
Report to john, rn

## 2022-11-30 NOTE — Assessment & Plan Note (Addendum)
Pain bilateral knees.  Right knee effusion.  Also acute pain in left elbow.  Start Solu-Medrol.

## 2022-11-30 NOTE — Assessment & Plan Note (Addendum)
Continue Cardizem CD and spironolactone

## 2022-11-30 NOTE — ED Notes (Signed)
Pt placed on bedpan to void and have bowel movement, surgical consent signed and placed in pt chart.

## 2022-11-30 NOTE — Transfer of Care (Signed)
Immediate Anesthesia Transfer of Care Note  Patient: Kaitlyn Huff  Procedure(s) Performed: RIGHT KNEE ARTHROSCOPIC WASHOUT (Right: Knee)  Patient Location: PACU  Anesthesia Type:General  Level of Consciousness: awake, drowsy, and patient cooperative  Airway & Oxygen Therapy: Patient Spontanous Breathing and Patient connected to face mask oxygen  Post-op Assessment: Report given to RN and Post -op Vital signs reviewed and stable  Post vital signs: Reviewed and stable  Last Vitals:  Vitals Value Taken Time  BP    Temp    Pulse 90 11/30/22 1557  Resp 14 11/30/22 1557  SpO2 98 % 11/30/22 1557  Vitals shown include unvalidated device data.  Last Pain:  Vitals:   11/30/22 1401  TempSrc: Oral  PainSc: 4       Patients Stated Pain Goal: 6 (11/29/22 2220)  Complications: No notable events documented.

## 2022-11-30 NOTE — Anesthesia Procedure Notes (Addendum)
Procedure Name: General with mask airway Date/Time: 11/30/2022 3:38 PM  Performed by: Mohammed Kindle, CRNAPre-anesthesia Checklist: Patient identified, Emergency Drugs available, Suction available and Patient being monitored Patient Re-evaluated:Patient Re-evaluated prior to induction Oxygen Delivery Method: Circle system utilized Preoxygenation: Pre-oxygenation with 100% oxygen Induction Type: IV induction Laryngoscope Size: McGraph and 3 Grade View: Grade I Tube size: 6.5 mm Placement Confirmation: ETT inserted through vocal cords under direct vision, positive ETCO2, CO2 detector and breath sounds checked- equal and bilateral Secured at: 21 cm Tube secured with: Tape Dental Injury: Teeth and Oropharynx as per pre-operative assessment

## 2022-11-30 NOTE — Assessment & Plan Note (Addendum)
With joint effusion.  Currently no organisms or crystals seen on aspiration.  16,109 white blood cells on aspiration.  OR cultures did not show any organism also.  ID switched antibiotics over to Ancef.

## 2022-11-30 NOTE — Assessment & Plan Note (Signed)
Continue statin. 

## 2022-11-30 NOTE — Consult Note (Signed)
ORTHOPAEDIC CONSULTATION  REQUESTING PHYSICIAN: Alford Highland, MD  Chief Complaint: Right knee pain and swelling  HPI: Kaitlyn Huff is a 82 y.o. female with history of RA on several immunosuppressive medications and valvular heart disease which is stable who presents with approximately 1 to 2 days of increasing right pain and swelling.  She has not experienced any fevers or chills.  With the RA the patient does have occasional flareups but this is more painful.  She denies any recent injury.  Patient states she has had a previous episode similar to this in the left knee.  Emergency department aspirated the right knee.  Patient had a white count of 38,335 and no crystals.  It was yellow and cloudy.  Gram stain showed moderate amount of WBCs without organisms.  Cultures are pending.  Patient denies a history of gout.  Past Medical History:  Diagnosis Date   Anemia    Cancer (HCC)    Diverticulitis    GERD (gastroesophageal reflux disease)    Hyperlipidemia    Hypertension    Osteoarthritis    Osteoporosis    actonel   Positive PPD    s/p INH (2006)   Rheumatoid arthritis(714.0)    MTX transaminitis, Leflunomide (rash), enbrel, plaquinil, prednisone, remicade, Imuran (transaminitis)   Valvular heart disease    moderate MR and TR   Past Surgical History:  Procedure Laterality Date   ABDOMINAL HYSTERECTOMY     CERVICAL CONE BIOPSY     CHOLECYSTECTOMY  06/22/14   COLONOSCOPY WITH PROPOFOL N/A 07/09/2015   Procedure: COLONOSCOPY WITH PROPOFOL;  Surgeon: Scot Jun, MD;  Location: [facility] ENDOSCOPY;  Service: Endoscopy;  Laterality: N/A;   ESOPHAGOGASTRODUODENOSCOPY (EGD) WITH PROPOFOL N/A 07/09/2015   Procedure: ESOPHAGOGASTRODUODENOSCOPY (EGD) WITH PROPOFOL;  Surgeon: Scot Jun, MD;  Location: [facility] ENDOSCOPY;  Service: Endoscopy;  Laterality: N/A;   OVARY SURGERY     TRACHEOSTOMY  1959   TUBAL LIGATION     Social History   Socioeconomic History   Marital status:  Widowed    Spouse name: Not on file   Number of children: 4   Years of education: Not on file   Highest education level: Not on file  Occupational History   Occupation: retired  Tobacco Use   Smoking status: Never   Smokeless tobacco: Never  Substance and Sexual Activity   Alcohol use: No    Alcohol/week: 0.0 standard drinks of alcohol   Drug use: No   Sexual activity: Never  Other Topics Concern   Not on file  Social History Narrative   Lives with family at home   Social Determinants of Health   Financial Resource Strain: Low Risk  (09/02/2022)   Overall Financial Resource Strain (CARDIA)    Difficulty of Paying Living Expenses: Not hard at all  Food Insecurity: No Food Insecurity (09/02/2022)   Hunger Vital Sign    Worried About Running Out of Food in the Last Year: Never true    Ran Out of Food in the Last Year: Never true  Transportation Needs: No Transportation Needs (09/02/2022)   PRAPARE - Administrator, Civil Service (Medical): No    Lack of Transportation (Non-Medical): No  Physical Activity: Unknown (09/02/2022)   Exercise Vital Sign    Days of Exercise per Week: 0 days    Minutes of Exercise per Session: Not on file  Stress: No Stress Concern Present (09/02/2022)   [employer] of Occupational Health - Occupational Stress Questionnaire  Feeling of Stress : Not at all  Social Connections: Unknown (09/02/2022)   Social Connection and Isolation Panel [NHANES]    Frequency of Communication with Friends and Family: More than three times a week    Frequency of Social Gatherings with Friends and Family: More than three times a week    Attends Religious Services: Not on Marketing executive or Organizations: Yes    Attends Banker Meetings: Not on file    Marital Status: Not on file   Family History  Problem Relation Age of Onset   Heart disease Father        MI   Heart disease Mother    Valvular heart disease Mother     Breast cancer Sister 73   Cancer Sister        Lung cancer   COPD Sister    Heart disease Sister    Colon cancer Neg Hx    Allergies  Allergen Reactions   Astelin [Azelastine Hcl] Other (See Comments)    Reaction:  Unknown    Codeine Other (See Comments)    Reaction:  Altered mental status   Dilaudid [Hydromorphone] Other (See Comments)    Reaction:  Unknown    Flexeril [Cyclobenzaprine] Other (See Comments)    Reaction:  Unknown    Imuran [Azathioprine] Other (See Comments)    Reaction:  Abnormal liver function   Lisinopril Itching   Lyrica [Pregabalin] Other (See Comments)    Reaction:  Sore gums    Methotrexate Derivatives Other (See Comments)    Reaction:  Abnormal liver function   Amiodarone Anxiety and Other (See Comments)    Pt could not eat or sleep. Caused n/v and "messed her up"   Amoxicillin Rash and Other (See Comments)    Unable to obtain enough information to answer additional questions about this medication.     Arava [Leflunomide] Rash   Clindamycin/Lincomycin Rash   Doxycycline Rash   Lodine [Etodolac] Rash   Percocet [Oxycodone-Acetaminophen] Rash   Sulfa Antibiotics Rash   Prior to Admission medications   Medication Sig Start Date End Date Taking? Authorizing Provider  albuterol (VENTOLIN HFA) 108 (90 Base) MCG/ACT inhaler USE 2 INHALATIONS EVERY 6 HOURS AS NEEDED FOR WHEEZING OR SHORTNESS OF BREATH 07/12/22  Yes Dale [place], MD  ALPRAZolam Prudy Feeler) 0.25 MG tablet Take 1 tablet (0.25 mg total) by mouth 2 (two) times daily as needed for anxiety. 04/05/19  Yes Enid Baas, MD  atorvastatin (LIPITOR) 20 MG tablet Take 20 mg by mouth daily. 09/26/22  Yes [provider]  cetirizine (ZYRTEC) 10 MG tablet Take 1 tablet (10 mg total) by mouth daily. 11/08/22  Yes Dale [place], MD  colestipol (COLESTID) 1 g tablet Take 2 g by mouth daily.   Yes [provider]  diltiazem (CARDIZEM CD) 180 MG 24 hr capsule Take 180 mg by mouth daily.  06/14/21  Yes [provider]  diltiazem (CARDIZEM) 30 MG tablet Take 30 mg by mouth daily as needed. 04/20/22  Yes [provider]  fluticasone (FLONASE) 50 MCG/ACT nasal spray USE 2 SPRAYS IN EACH NOSTRIL DAILY (CALL OFFICE SOON TO RESCHEDULE YOUR PHYSICAL) 06/22/22  Yes Dale [place], MD  gabapentin (NEURONTIN) 100 MG capsule Take 100 mg by mouth daily. 01/14/20  Yes [provider]  lip balm (BLISTEX) OINT Apply 1 application topically as needed for lip care. 06/21/16  Yes Alford Highland, MD  Multiple Vitamin (MULTIVITAMIN WITH MINERALS) TABS tablet Take  1 tablet by mouth daily.   Yes [provider]  omeprazole (PRILOSEC) 20 MG capsule Take 1 capsule by mouth daily. 10/20/20  Yes [provider]  Probiotic Product (ALIGN PO) Take 1 tablet by mouth daily.   Yes [provider]  spironolactone (ALDACTONE) 25 MG tablet Take 12.5 mg by mouth daily. 08/05/20  Yes [provider]  lidocaine (LIDODERM) 5 % Place 1 patch onto the skin every 12 (twelve) hours. Remove & Discard patch within 12 hours or as directed by MD Patient not taking: Reported on 11/29/2022 04/15/22 04/15/23  Delton [place], MD   DG Knee Complete 4 Views Right  Result Date: 11/29/2022 CLINICAL DATA:  Bilateral knee pain. EXAM: RIGHT KNEE - COMPLETE 4+ VIEW COMPARISON:  February 27, 2019. FINDINGS: No definite fracture or dislocation is noted. Large suprapatellar joint effusion is noted. 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 IMPRESSION: Large suprapatellar joint effusion. Mild degenerative change is noted medially. No fracture or dislocation. Electronically Signed   By: Lupita Raider M.D.   On: 11/29/2022 14:36    Positive ROS: All other systems have been reviewed and were otherwise negative with the exception of those mentioned in the HPI and as above.  Physical Exam: General: Alert, no acute distress  MUSCULOSKELETAL: Right knee: Patient  has a large knee effusion without significant erythema.  There is pain with knee motion.  It is tender to palpation globally.  Distally she is neurovascular intact.  Compartments are soft and compressible.  Assessment: Right knee pain and swelling concerning for possible septic right knee joint  Plan: Patient is currently stable and comfortable.  Her WBC is up today to 15.3 from 13.9 on admission.  She is currently afebrile.  Despite aspiration in the ED overnight she has a large knee effusion.  Given her immunosuppressive agents for RA it is possible she has a septic knee joint.  I am recommending an arthroscopic washout of the right knee.  Patient will be n.p.o. until surgery.  I called spoke to her daughter Donivan Scull by phone today to discuss the surgical plan.  Both the patient and her daughter were in agreement. I discussed the risks and benefits of surgery. The risks include but are not limited to recurrent or persistent infection, bleeding , nerve or blood vessel injury, joint stiffness or loss of motion, persistent pain, weakness or instability, osteomyelitis and the need for further surgery. Medical risks include but are not limited to DVT and pulmonary embolism, myocardial infarction, stroke, pneumonia, respiratory failure and death.    Juanell Fairly, MD    11/30/2022 8:16 AM

## 2022-11-30 NOTE — Anesthesia Preprocedure Evaluation (Addendum)
Anesthesia Evaluation  Patient identified by MRN, date of birth, ID band Patient awake    Reviewed: Allergy & Precautions, NPO status , Patient's Chart, lab work & pertinent test results  History of Anesthesia Complications Negative for: history of anesthetic complications  Airway Mallampati: IV   Neck ROM: Full    Dental  (+) Edentulous Upper, Edentulous Lower   Pulmonary sleep apnea    Pulmonary exam normal breath sounds clear to auscultation       Cardiovascular hypertension, Normal cardiovascular exam+ Valvular Problems/Murmurs (moderate MR and TR)  Rhythm:Regular Rate:Normal     Neuro/Psych negative neurological ROS     GI/Hepatic ,GERD  ,,Fatty liver    Endo/Other  negative endocrine ROS    Renal/GU Renal disease (stage III CKD)     Musculoskeletal  (+) Arthritis , Osteoarthritis and Rheumatoid disorders,    Abdominal   Peds  Hematology  (+) Blood dyscrasia, anemia   Anesthesia Other Findings   Reproductive/Obstetrics                             Anesthesia Physical Anesthesia Plan  ASA: 3  Anesthesia Plan: General   Post-op Pain Management:    Induction: Intravenous  PONV Risk Score and Plan: 3 and Ondansetron, Dexamethasone and Treatment may vary due to age or medical condition  Airway Management Planned: Oral ETT  Additional Equipment:   Intra-op Plan:   Post-operative Plan: Extubation in OR  Informed Consent: I have reviewed the patients History and Physical, chart, labs and discussed the procedure including the risks, benefits and alternatives for the proposed anesthesia with the patient or authorized representative who has indicated his/her understanding and acceptance.     Dental advisory given  Plan Discussed with: CRNA  Anesthesia Plan Comments: (Patient consented for risks of anesthesia including but not limited to:  - adverse reactions to  medications - damage to eyes, teeth, lips or other oral mucosa - nerve damage due to positioning  - sore throat or hoarseness - damage to heart, brain, nerves, lungs, other parts of body or loss of life  Informed patient about role of CRNA in peri- and intra-operative care.  Patient voiced understanding.)        Anesthesia Quick Evaluation

## 2022-12-01 ENCOUNTER — Inpatient Hospital Stay: Payer: Medicare Other

## 2022-12-01 ENCOUNTER — Inpatient Hospital Stay
Admit: 2022-12-01 | Discharge: 2022-12-01 | Disposition: A | Discharge status: Home / Self Care | Payer: Medicare Other | Attending: Internal Medicine | Admitting: Internal Medicine

## 2022-12-01 ENCOUNTER — Encounter: Payer: Self-pay | Admitting: Orthopedic Surgery

## 2022-12-01 DIAGNOSIS — M069 Rheumatoid arthritis, unspecified: Secondary | ICD-10-CM | POA: Diagnosis not present

## 2022-12-01 DIAGNOSIS — I480 Paroxysmal atrial fibrillation: Secondary | ICD-10-CM

## 2022-12-01 DIAGNOSIS — I10 Essential (primary) hypertension: Secondary | ICD-10-CM | POA: Diagnosis not present

## 2022-12-01 DIAGNOSIS — R079 Chest pain, unspecified: Secondary | ICD-10-CM

## 2022-12-01 DIAGNOSIS — M25561 Pain in right knee: Secondary | ICD-10-CM | POA: Diagnosis not present

## 2022-12-01 LAB — CBC
HCT: 34.4 % — ABNORMAL LOW (ref 36.0–46.0)
Hemoglobin: 12 g/dL (ref 12.0–15.0)
MCH: 32 pg (ref 26.0–34.0)
MCHC: 34.9 g/dL (ref 30.0–36.0)
MCV: 91.7 fL (ref 80.0–100.0)
Platelets: 212 10*3/uL (ref 150–400)
RBC: 3.75 MIL/uL — ABNORMAL LOW (ref 3.87–5.11)
RDW: 12.6 % (ref 11.5–15.5)
WBC: 13.2 10*3/uL — ABNORMAL HIGH (ref 4.0–10.5)
nRBC: 0 % (ref 0.0–0.2)

## 2022-12-01 LAB — PROTEIN, BODY FLUID (OTHER): Total Protein, Body Fluid Other: 3.5 g/dL

## 2022-12-01 LAB — URIC ACID, BODY FLUID: Uric Acid Body Fluid: 4.9 mg/dL

## 2022-12-01 LAB — GRAM STAIN

## 2022-12-01 LAB — CREATININE, SERUM
Creatinine, Ser: 1.03 mg/dL — ABNORMAL HIGH (ref 0.44–1.00)
GFR, Estimated: 55 mL/min — ABNORMAL LOW (ref >60)

## 2022-12-01 LAB — TROPONIN I (HIGH SENSITIVITY)
Troponin I (High Sensitivity): 41 ng/L — ABNORMAL HIGH (ref <18)
Troponin I (High Sensitivity): 43 ng/L — ABNORMAL HIGH (ref <18)

## 2022-12-01 LAB — ECHOCARDIOGRAM COMPLETE: Weight: 1996.49 oz

## 2022-12-01 LAB — BODY FLUID CULTURE W GRAM STAIN

## 2022-12-01 LAB — GLUCOSE, BODY FLUID OTHER: Glucose, Body Fluid Other: 7 mg/dL

## 2022-12-01 LAB — CULTURE, BLOOD (ROUTINE X 2): Special Requests: ADEQUATE

## 2022-12-01 MED ORDER — ASPIRIN 81 MG PO CHEW
324.0000 mg | CHEWABLE_TABLET | Freq: Once | ORAL | Status: AC
  Administered 2022-12-01: 324 mg via ORAL
  Filled 2022-12-01: qty 4

## 2022-12-01 MED ORDER — ASPIRIN 81 MG PO TBEC
81.0000 mg | DELAYED_RELEASE_TABLET | Freq: Every day | ORAL | Status: DC
  Administered 2022-12-02 – 2022-12-04 (×3): 81 mg via ORAL
  Filled 2022-12-01 (×3): qty 1

## 2022-12-01 MED ORDER — IOHEXOL 350 MG/ML SOLN
75.0000 mL | Freq: Once | INTRAVENOUS | Status: AC | PRN
  Administered 2022-12-01: 75 mL via INTRAVENOUS

## 2022-12-01 MED ORDER — NITROGLYCERIN 0.4 MG SL SUBL
0.4000 mg | SUBLINGUAL_TABLET | SUBLINGUAL | Status: DC | PRN
  Administered 2022-12-01 (×2): 0.4 mg via SUBLINGUAL
  Filled 2022-12-01: qty 1

## 2022-12-01 MED ORDER — NITROGLYCERIN 0.4 MG SL SUBL
SUBLINGUAL_TABLET | SUBLINGUAL | Status: AC
  Filled 2022-12-01: qty 1

## 2022-12-01 MED ORDER — IOHEXOL 350 MG/ML SOLN: 80.0000 mL | Freq: Once | INTRAVENOUS | Status: DC | PRN

## 2022-12-01 MED ORDER — ALUM & MAG HYDROXIDE-SIMETH 200-200-20 MG/5ML PO SUSP
30.0000 mL | Freq: Four times a day (QID) | ORAL | Status: DC | PRN
  Administered 2022-12-02: 30 mL via ORAL
  Filled 2022-12-01: qty 30

## 2022-12-01 NOTE — Plan of Care (Signed)

## 2022-12-01 NOTE — Progress Notes (Addendum)
Called with patient having chest pressure.  EKG done and shows atrial fibrillation rate controlled.  Pressure in the center of her chest with some shortness of breath.  No nausea or vomiting.  She awakened from sleep and felt the pain and did not want to eat.  Troponins ordered.  Aspirin ordered.  CT scan of the chest ordered to rule out PE.  Patient given 1 nitroglycerin with some improvement of pain and then given a second nitroglycerin with some more improvement of the pain.  Physical Exam HENT:     Head: Normocephalic.     Mouth/Throat:     Pharynx: No oropharyngeal exudate.  Eyes:     General: Lids are normal.     Conjunctiva/sclera: Conjunctivae normal.  Cardiovascular:     Rate and Rhythm: Normal rate. Rhythm irregularly irregular.     Heart sounds: Normal heart sounds, S1 normal and S2 normal.  Pulmonary:     Breath sounds: No decreased breath sounds, wheezing, rhonchi or rales.  Chest:     Comments: Slight pain over lower chest wall Abdominal:     Palpations: Abdomen is soft.     Tenderness: There is no abdominal tenderness.  Musculoskeletal:     Comments: Right knee covered  Skin:    General: Skin is warm.     Findings: No rash.  Neurological:     Mental Status: She is alert and oriented to person, place, and time.     Plan: Chest pain. CT scan of the chest ordered to rule out PE.  Troponins ordered to rule out myocardial infarction.  Aspirin given.  As needed nitroglycerin.  Messaged orthopedic and okay to use heparin if needed.  Messaged cardiology team.  Place back on tele.    Time spent 30 minutes Case discussed with nursing staff

## 2022-12-01 NOTE — Progress Notes (Addendum)
Subjective:  POD #1 s/p right knee arthroscopic irrigation and debridement for possible septic knee joint.  Patient was seen at approximately 7 PM.  She was just returning from imaging.  Patient reported chest pressure and was found to be in atrial fibrillation by EKG.  CT angiogram was performed to rule out pulmonary embolism and no pulmonary embolism was found.  She had cardiomegaly and small bilateral pleural effusions.  When I saw the patient she was not having any significant right knee pain.  Her family is at the bedside.    Objective:   VITALS:   Vitals:   12/01/22 0500 12/01/22 0837 12/01/22 1554 12/01/22 1741  BP:  108/62 (!) 120/58 (!) 111/43  Pulse:  72 (!) 59 62  Resp:  (!) 22 18   Temp:  97.7 F (36.5 C) 97.9 F (36.6 C)   TempSrc:   Oral   SpO2:  94% 93% 94%  Weight: 56.6 kg     Height:        PHYSICAL EXAM: Right lower extremity: Patient's dressing remains clean and dry.  She has a Polar Care on. Neurovascular intact Sensation intact distally Intact pulses distally Dorsiflexion/Plantar flexion intact Incision: dressing C/D/I No cellulitis present Compartment soft  LABS  Results for orders placed or performed during the hospital encounter of 11/29/22 (from the past 24 hour(s))  Creatinine, serum     Status: Abnormal   Collection Time: 12/01/22  4:55 AM  Result Value Ref Range   Creatinine, Ser 1.03 (H) 0.44 - 1.00 mg/dL   GFR, Estimated 55 (L) >60 mL/min  CBC     Status: Abnormal   Collection Time: 12/01/22  9:13 AM  Result Value Ref Range   WBC 13.2 (H) 4.0 - 10.5 K/uL   RBC 3.75 (L) 3.87 - 5.11 MIL/uL   Hemoglobin 12.0 12.0 - 15.0 g/dL   HCT 02.7 (L) 25.3 - 66.4 %   MCV 91.7 80.0 - 100.0 fL   MCH 32.0 26.0 - 34.0 pg   MCHC 34.9 30.0 - 36.0 g/dL   RDW 40.3 47.4 - 25.9 %   Platelets 212 150 - 400 K/uL   nRBC 0.0 0.0 - 0.2 %  Troponin I (High Sensitivity)     Status: Abnormal   Collection Time: 12/01/22  5:58 PM  Result Value Ref Range   Troponin  I (High Sensitivity) 41 (H) <18 ng/L    CT Angio Chest Pulmonary Embolism (PE) W or WO Contrast  Result Date: 12/01/2022 CLINICAL DATA:  PE suspected, negative D-dimer EXAM: CT ANGIOGRAPHY CHEST WITH CONTRAST TECHNIQUE: Multidetector CT imaging of the chest was performed using the standard protocol during bolus administration of intravenous contrast. Multiplanar CT image reconstructions and MIPs were obtained to evaluate the vascular anatomy. RADIATION DOSE REDUCTION: This exam was performed according to the departmental dose-optimization program which includes automated exposure control, adjustment of the mA and/or kV according to patient size and/or use of iterative reconstruction technique. CONTRAST:  75mL OMNIPAQUE IOHEXOL 350 MG/ML SOLN COMPARISON:  None Available. FINDINGS: Cardiovascular: Satisfactory opacification of the pulmonary arteries to the segmental level. No evidence of pulmonary embolism. Cardiomegaly. Three-vessel coronary artery calcifications. No pericardial effusion. Mediastinum/Nodes: No enlarged mediastinal, hilar, or axillary lymph nodes. Moderate hiatal hernia with intrathoracic position of the gastric fundus. Thyroid gland, trachea, and esophagus demonstrate no significant findings. Lungs/Pleura: Small bilateral pleural effusions and associated atelectasis or consolidation Upper Abdomen: No acute abnormality.  Status post cholecystectomy. Musculoskeletal: No chest wall abnormality. No acute osseous findings.  Review of the MIP images confirms the above findings. IMPRESSION: 1. Negative examination for pulmonary embolism. 2. Small bilateral pleural effusions and associated atelectasis or consolidation. 3. Cardiomegaly and coronary artery disease. 4. Moderate hiatal hernia with intrathoracic position of the gastric fundus. Electronically Signed   By: Jearld Lesch M.D.   On: 12/01/2022 19:12   DG Cervical Spine 2 or 3 views  Result Date: 12/01/2022 CLINICAL DATA:  Neck pain EXAM:  CERVICAL SPINE - 2-3 VIEW COMPARISON:  CT 06/07/15 FINDINGS: No acute posttraumatic malalignment of the cervical spine. Unchanged stepwise first degree anterolisthesis of C4 on C5 and C5 on C6. disc space narrowing and endplate spurring is identified at C5-6 and C6-7. Bilateral multilevel facet arthropathy identified. No signs of acute fracture or dislocation. Prevertebral soft tissues are unremarkable. Left-sided carotid artery calcifications noted. IMPRESSION: 1. No acute findings. 2. 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 3. Left-sided carotid artery calcifications. Consider further evaluation with carotid ultrasound. Electronically Signed   By: Signa Kell M.D.   On: 12/01/2022 15:30    Assessment/Plan: 1 Day Post-Op   Principal Problem:   Acute pain of right knee Active Problems:   Hypercholesterolemia   Rheumatoid arthritis (HCC)   GERD (gastroesophageal reflux disease)   Hypertension   Obstructive sleep apnea   Hyponatremia   Paroxysmal atrial fibrillation (HCC)   Chest pain  Patient undergoing a workup for chest pressure/pain.  Patient is in atrial fibrillation but rate controlled.  Patient is stable from an orthopedic standpoint.  Her dressings are clean and dry.  There is no significant effusion of her right knee.  Patient's white count is down to 13.2 from 15.3 yesterday.  He is afebrile.  Cultures are pending but show abundant white blood cells without organisms so far.  Gram stain showed few white blood cells but no organisms.  I will change the patient's dressing tomorrow.  She may weight-bear as tolerated with physical therapy once medically cleared.    Juanell Fairly , MD 12/01/2022, 8:40 PM

## 2022-12-01 NOTE — Evaluation (Signed)
Physical Therapy Evaluation Patient Details Name: Kaitlyn Huff MRN: [id number] DOB: Jul 14, 1940 Today's Date: 12/01/2022  History of Present Illness  Kaitlyn Huff comes to [facility] on 5/21 c BLE pain. PMH: OA, RA, HTN, HLD. 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 Right knee arthroscopic irrigation and debridement with partial medial meniscectomy and extensive synovectomy c Dr. Martha Clan 5/22.  Clinical Impression  Pt in bed on entry, family at bedside. Pt reports elevated pain in RLE, after extensive questioning from author sound sciatic in distribution, ankle heel, midfoot dorsum- pt actively avoiding and not tolerating RLE tibial nerve lengthening at ankle level. Upon transition to short sitting, has pain from mid back to base of skull- questionable spinal cord neural tension? RN brings pain meds mid session. Pt requires minA for bed mobility, largely for assist of RLE. Pt unable to tolerate any supine level ROM of RLE, but does ok at EOB from a ROM standpoint- appears to be lacking >20 degrees TKE. Pt has dizziness after standing, then persists while at EOB, does not resolve in session. BP appears WNL, congruent with AM vitals hours earlier. Pt performs 2 more transfers for education at EOS, but remains quite dizzy/limited. Pt tolerating only ~25% weightbearing when up. Pt assisted back to bed with assist from Son. We discuss need to figure out 5 entry stairs next session if possible in preparation for going home.     Recommendations for follow up therapy are one component of a multi-disciplinary discharge planning process, led by the attending physician.  Recommendations may be updated based on patient status, additional functional criteria and insurance authorization.  Follow Up Recommendations       Assistance Recommended at Discharge None  Patient can return home with the following  A lot of help with walking and/or transfers;A lot of help with  bathing/dressing/bathroom;Assist for transportation;Assistance with cooking/housework;Help with stairs or ramp for entrance    Equipment Recommendations Other (comment) (defer to after stairs training.)  Recommendations for Other Services       Functional Status Assessment Patient has had a recent decline in their functional status and demonstrates the ability to make significant improvements in function in a reasonable and predictable amount of time.     Precautions / Restrictions Precautions Precautions: Fall Restrictions Weight Bearing Restrictions:  (Dr K orders say no restrictions; He confirms WBAT, no ROM restrictions, no bracing needed)      Mobility  Bed Mobility Overal bed mobility: Needs Assistance Bed Mobility: Supine to Sit, Sit to Supine     Supine to sit: Min assist Sit to supine: Min assist   General bed mobility comments: mostly of RLE, but max effort to manage global body    Transfers Overall transfer level: Needs assistance Equipment used: Rolling walker (2 wheels) Transfers: Sit to/from Stand Sit to Stand: Min assist           General transfer comment: minA from EOB, then twice practiced from elevatved surface height    Ambulation/Gait Ambulation/Gait assistance:  (pt unable, gets dizzy each time she stands)                Stairs Stairs:  (will be difficult to teach unless pain improved dramatically.)          Wheelchair Mobility    Modified Rankin (Stroke Patients Only)       Balance  Pertinent Vitals/Pain Pain Assessment Pain Assessment: 0-10 Pain Score: 8  Pain Location: Rt ankle/foot, sounds sciatic (pt avoiding neural tension), has neural tension up to neck when up sitting Pain Descriptors / Indicators: Aching Pain Intervention(s): Limited activity within patient's tolerance, Monitored during session, Premedicated before session, Repositioned    Home  Living Family/patient expects to be discharged to:: Private residence Living Arrangements: Children (DTR Kendal Hymen lives there, in/out during day, 2 other DTRs and SON live nearby) Available Help at Discharge: Family Type of Home: House Home Access: Stairs to enter Entrance Stairs-Rails: Can reach both Secretary/administrator of Steps: 5     Home Equipment: Agricultural consultant (2 wheels)      Prior Function Prior Level of Function : Independent/Modified Independent;Driving             Mobility Comments: dancing; RW       Hand Dominance        Extremity/Trunk Assessment                Communication      Cognition Arousal/Alertness: Awake/alert Behavior During Therapy: WFL for tasks assessed/performed Overall Cognitive Status: Within Functional Limits for tasks assessed                                          General Comments      Exercises     Assessment/Plan    PT Assessment Patient needs continued PT services  PT Problem List Decreased strength;Decreased range of motion;Decreased cognition;Decreased activity tolerance;Decreased balance;Decreased mobility;Decreased safety awareness;Decreased knowledge of precautions       PT Treatment Interventions DME instruction;Balance training;Gait training;Stair training;Functional mobility training;Therapeutic activities;Therapeutic exercise;Wheelchair mobility training;Patient/family education    PT Goals (Current goals can be found in the Care Plan section)  Acute Rehab PT Goals Patient Stated Goal: be able to mobilize without assist, improved pain PT Goal Formulation: With patient Time For Goal Achievement: 12/15/22 Potential to Achieve Goals: Fair    Frequency Min 4X/week     Co-evaluation               AM-PAC PT "6 Clicks" Mobility  Outcome Measure Help needed turning from your back to your side while in a flat bed without using bedrails?: A Lot Help needed moving from lying on your  back to sitting on the side of a flat bed without using bedrails?: A Lot Help needed moving to and from a bed to a chair (including a wheelchair)?: A Lot Help needed standing up from a chair using your arms (e.g., wheelchair or bedside chair)?: A Lot Help needed to walk in hospital room?: Total Help needed climbing 3-5 steps with a railing? : Total 6 Click Score: 10    End of Session   Activity Tolerance: Patient tolerated treatment well;Treatment limited secondary to medical complications (Comment);Patient limited by pain (dizziness) Patient left: in bed;with call bell/phone within reach;with family/visitor present Nurse Communication: Mobility status PT Visit Diagnosis: Difficulty in walking, not elsewhere classified (R26.2);Muscle weakness (generalized) (M62.81);Other abnormalities of gait and mobility (R26.89);Other symptoms and signs involving the nervous system (R29.898)    Time: 1610-9604 PT Time Calculation (min) (ACUTE ONLY): 40 min   Charges:   PT Evaluation $PT Eval Moderate Complexity: 1 Mod PT Treatments $Therapeutic Activity: 8-22 mins       2:18 PM, 12/01/22 Rosamaria Lints, PT, DPT Physical Therapist - [facility]  [facility]  [phone] (ASCOM)    Ellakate Gonsalves C 12/01/2022, 2:06 PM

## 2022-12-01 NOTE — TOC Progression Note (Addendum)
Transition of Care [facility]) - Progression Note    Patient Details  Name: Kaitlyn Huff MRN: [id number] Date of Birth: [birth year]  Transition of [facility]) CM/SW Contact  Marlowe Sax, RN Phone Number: 12/01/2022, 9:25 AM  Clinical Narrative:   The patient lives alone and has her sister visit often, normally she is mobile without Assisted devices, however; she does have a cane and walker at home [facility] to follow and assist with DC planning    Expected Discharge Plan: Skilled Nursing Facility Barriers to Discharge: Continued Medical Work up  Expected Discharge Plan and Services   Discharge Planning Services: CM Consult   Living arrangements for the past 2 months: Single Family Home                 DME Arranged: N/A DME Agency: NA                   Social Determinants of Health (SDOH) Interventions SDOH Screenings   Food Insecurity: No Food Insecurity (11/30/2022)  Housing: Low Risk  (11/30/2022)  Transportation Needs: No Transportation Needs (11/30/2022)  Utilities: Not At Risk (11/30/2022)  Depression (PHQ2-9): Low Risk  (09/02/2022)  Financial Resource Strain: Low Risk  (09/02/2022)  Physical Activity: Unknown (09/02/2022)  Social Connections: Unknown (09/02/2022)  Stress: No Stress Concern Present (09/02/2022)  Tobacco Use: Low Risk  (12/01/2022)    Readmission Risk Interventions     No data to display

## 2022-12-01 NOTE — Assessment & Plan Note (Addendum)
Continue cardizem cd.  Patient postoperatively went into afib but rate controlled.  Reviewed prior cardiology notes and with daughter about no anticoagulation with fall risk.  Higher risk of stroke without anticoagulation.  On aspirin alone.

## 2022-12-01 NOTE — Progress Notes (Signed)
Progress Note   Patient: Kaitlyn Huff ZOX:[id number] DOB: Dec 02, 1940 DOA: 11/29/2022     2 DOS: the patient was seen and examined on 12/01/2022   Brief hospital course: 82 y.o. female with medical history significant of   HLD, HTN , RA on infusion q 2 months, valvular heart disease stable, GERD,  who presents to ED  with one day  swelling and pain  of right knee. She notes no associated fever/chills / sob/ chest pain / n/v/ or abdominal pain. Patient notes current symptoms are similar to her typical flare but more painful.  Patient denies any fall or injury. Patient states she noted symptoms on awaking this am. She also note she is unable to walk due to pain. She states she had a near fall prior to presenting to ED.  She also notes pain in her left knee but states it is not as severe.   5/22.  Right knee aspiration does not show any organisms or crystals.  White blood cell count 38 335 and fluid with 80% neutrophils.  Patient will be brought to the operating room today by Dr. Martha Clan. Post-operatively went into afib. 5/23.  POD 1. Left knee pain.  Still in atrial fibrillation.    Assessment and Plan: * Acute pain of right knee With joint effusion.  Currently no organisms or crystals seen on aspiration.  81,191 white blood cells on aspiration.  OR cultures did not show any organism also.  Continue empiric antibiotics with Rocephin and vancomycin.  Paroxysmal atrial fibrillation (HCC) Continue cardizem cd.  Patient postoperatively went into afib but rate controlled.  Reviewed prior cardiology notes and with daughter about no anticoagulation with fall risk.  Higher risk of stroke without anticoagulation.  Rheumatoid arthritis (HCC) Pain bilateral knees.  Right knee effusion.  Hypertension Continue Cardizem CD and spironolactone  Hyponatremia Sodium 131  Hypercholesterolemia Continue statin  Obstructive sleep apnea On oxygen at night  GERD (gastroesophageal reflux disease) On  PPI        Subjective: Patient complained of right knee pain and neck pain.  Had right knee surgery yesterday.  Physical Exam: Vitals:   12/01/22 0408 12/01/22 0500 12/01/22 0837 12/01/22 1554  BP: 125/61  108/62 (!) 120/58  Pulse:   72 (!) 59  Resp: 20  (!) 22 18  Temp: 98 F (36.7 C)  97.7 F (36.5 C) 97.9 F (36.6 C)  TempSrc:      SpO2: 100%  94% 93%  Weight:  56.6 kg    Height:       Physical Exam HENT:     Head: Normocephalic.     Mouth/Throat:     Pharynx: No oropharyngeal exudate.  Eyes:     General: Lids are normal.     Conjunctiva/sclera: Conjunctivae normal.  Cardiovascular:     Rate and Rhythm: Normal rate and regular rhythm.     Heart sounds: Normal heart sounds, S1 normal and S2 normal.  Pulmonary:     Breath sounds: No decreased breath sounds, wheezing, rhonchi or rales.  Abdominal:     Palpations: Abdomen is soft.     Tenderness: There is no abdominal tenderness.  Musculoskeletal:     Comments: Right knee covered  Skin:    General: Skin is warm.     Findings: No rash.  Neurological:     Mental Status: She is alert and oriented to person, place, and time.     Data Reviewed: So far no organisms seen on aspiration or operative  culture  Family Communication: Updated daughter on the phone  Disposition: Status is: Inpatient Remains inpatient appropriate because: POD 1 left knee surgery  Planned Discharge Destination: Home with Home Health    Time spent: 28 minutes  Author: Alford Highland, MD 12/01/2022 4:02 PM  For on call review [email or web address].

## 2022-12-01 NOTE — Progress Notes (Signed)
*  PRELIMINARY RESULTS* Echocardiogram 2D Echocardiogram has been performed.  Carolyne Fiscal 12/01/2022, 10:26 AM

## 2022-12-02 DIAGNOSIS — R0789 Other chest pain: Secondary | ICD-10-CM

## 2022-12-02 DIAGNOSIS — M069 Rheumatoid arthritis, unspecified: Secondary | ICD-10-CM | POA: Diagnosis not present

## 2022-12-02 DIAGNOSIS — M1120 Other chondrocalcinosis, unspecified site: Secondary | ICD-10-CM | POA: Diagnosis not present

## 2022-12-02 DIAGNOSIS — I10 Essential (primary) hypertension: Secondary | ICD-10-CM | POA: Diagnosis not present

## 2022-12-02 DIAGNOSIS — I480 Paroxysmal atrial fibrillation: Secondary | ICD-10-CM | POA: Diagnosis not present

## 2022-12-02 DIAGNOSIS — M25561 Pain in right knee: Secondary | ICD-10-CM | POA: Diagnosis not present

## 2022-12-02 LAB — BASIC METABOLIC PANEL
Anion gap: 8 (ref 5–15)
BUN: 27 mg/dL — ABNORMAL HIGH (ref 8–23)
CO2: 23 mmol/L (ref 22–32)
Calcium: 8.2 mg/dL — ABNORMAL LOW (ref 8.9–10.3)
Chloride: 94 mmol/L — ABNORMAL LOW (ref 98–111)
Creatinine, Ser: 0.91 mg/dL (ref 0.44–1.00)
GFR, Estimated: >60 mL/min (ref >60)
Glucose, Bld: 129 mg/dL — ABNORMAL HIGH (ref 70–99)
Potassium: 4.1 mmol/L (ref 3.5–5.1)
Sodium: 125 mmol/L — ABNORMAL LOW (ref 135–145)

## 2022-12-02 LAB — CBC
HCT: 33.5 % — ABNORMAL LOW (ref 36.0–46.0)
Hemoglobin: 11.7 g/dL — ABNORMAL LOW (ref 12.0–15.0)
MCH: 31.8 pg (ref 26.0–34.0)
MCHC: 34.9 g/dL (ref 30.0–36.0)
MCV: 91 fL (ref 80.0–100.0)
Platelets: 245 10*3/uL (ref 150–400)
RBC: 3.68 MIL/uL — ABNORMAL LOW (ref 3.87–5.11)
RDW: 12.4 % (ref 11.5–15.5)
WBC: 12.5 10*3/uL — ABNORMAL HIGH (ref 4.0–10.5)
nRBC: 0 % (ref 0.0–0.2)

## 2022-12-02 LAB — ECHOCARDIOGRAM COMPLETE
AR max vel: 1.82 cm2
AV Area VTI: 1.95 cm2
AV Area mean vel: 1.86 cm2
AV Mean grad: 5 mmHg
AV Peak grad: 11.9 mmHg
Ao pk vel: 1.73 m/s
Area-P 1/2: 3.53 cm2
Height: 65 in
MV VTI: 2.9 cm2
S' Lateral: 3 cm

## 2022-12-02 LAB — LIPID PANEL
Cholesterol: 132 mg/dL (ref 0–200)
HDL: 65 mg/dL (ref >40)
LDL Cholesterol: 49 mg/dL (ref 0–99)
Total CHOL/HDL Ratio: 2 RATIO
Triglycerides: 91 mg/dL (ref <150)
VLDL: 18 mg/dL (ref 0–40)

## 2022-12-02 LAB — BODY FLUID CULTURE W GRAM STAIN: Culture: NO GROWTH

## 2022-12-02 LAB — CULTURE, BLOOD (ROUTINE X 2)

## 2022-12-02 MED ORDER — METHYLPREDNISOLONE SODIUM SUCC 40 MG IJ SOLR
40.0000 mg | Freq: Every day | INTRAMUSCULAR | Status: DC
  Administered 2022-12-02 – 2022-12-03 (×2): 40 mg via INTRAVENOUS
  Filled 2022-12-02 (×2): qty 1

## 2022-12-02 MED ORDER — CEFAZOLIN SODIUM-DEXTROSE 1-4 GM/50ML-% IV SOLN
1.0000 g | Freq: Three times a day (TID) | INTRAVENOUS | Status: DC
  Administered 2022-12-03 – 2022-12-04 (×4): 1 g via INTRAVENOUS
  Filled 2022-12-02 (×5): qty 50

## 2022-12-02 MED ORDER — FUROSEMIDE 20 MG PO TABS
20.0000 mg | ORAL_TABLET | Freq: Every day | ORAL | Status: DC
  Administered 2022-12-03 – 2022-12-04 (×2): 20 mg via ORAL
  Filled 2022-12-02 (×2): qty 1

## 2022-12-02 NOTE — Progress Notes (Signed)
Occupational Therapy Evaluation Patient Details Name: Kaitlyn Huff MRN: [id number] DOB: [birth year] Today's Date: 12/02/2022   History of Present Illness Kaitlyn Huff comes to [facility] on 5/21 c BLE pain. PMH: OA, RA, HTN, HLD. 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 Right knee arthroscopic irrigation and debridement with partial medial meniscectomy and extensive synovectomy c Dr. Martha Clan 5/22.   Clinical Impression   Kaitlyn Huff was seen for OT evaluation this date. Prior to hospital admission, pt was MOD I using RW as needed. Pt lives with daughter. Son at bedside confirms pt not at baseline cognition. Pt currently requires CGA + RW for BSC t/f, hand washing standing sink side, and ~80 ft hallway mobility. MIN A underwear mgmt in standing. Pt would benefit from skilled OT to address noted impairments and functional limitations (see below for any additional details). Upon hospital discharge, recommend assist for IADLs.   Recommendations for follow up therapy are one component of a multi-disciplinary discharge planning process, led by the attending physician.  Recommendations may be updated based on patient status, additional functional criteria and insurance authorization.   Assistance Recommended at Discharge Intermittent Supervision/Assistance  Patient can return home with the following A little help with walking and/or transfers;A little help with bathing/dressing/bathroom;Help with stairs or ramp for entrance    Functional Status Assessment  Patient has had a recent decline in their functional status and demonstrates the ability to make significant improvements in function in a reasonable and predictable amount of time.  Equipment Recommendations  BSC/3in1    Recommendations for Other Services       Precautions / Restrictions Precautions Precautions: Fall Restrictions Weight Bearing Restrictions: No      Mobility Bed Mobility Overal bed  mobility: Needs Assistance Bed Mobility: Supine to Sit     Supine to sit: Min assist          Transfers Overall transfer level: Needs assistance Equipment used: Rolling walker (2 wheels) Transfers: Sit to/from Stand Sit to Stand: Min guard           General transfer comment: from bed and BSC height      Balance Overall balance assessment: Needs assistance Sitting-balance support: No upper extremity supported, Feet supported Sitting balance-Leahy Scale: Good     Standing balance support: No upper extremity supported, During functional activity Standing balance-Leahy Scale: Fair                             ADL either performed or assessed with clinical judgement   ADL Overall ADL's : Needs assistance/impaired                                       General ADL Comments: CGA + RW for toilet t/f and hand washing standing sink side. MIN A underwear mgmt in standing.      Pertinent Vitals/Pain Pain Assessment Pain Assessment: 0-10 Pain Score: 7  Pain Location: L arm Pain Descriptors / Indicators: Discomfort, Grimacing Pain Intervention(s): Limited activity within patient's tolerance, Premedicated before session, Repositioned     Hand Dominance Right   Extremity/Trunk Assessment Upper Extremity Assessment Upper Extremity Assessment: LUE deficits/detail LUE Deficits / Details: reports L elbow pain LUE: Unable to fully assess due to pain   Lower Extremity Assessment Lower Extremity Assessment: Generalized weakness       Communication Communication  Communication: HOH   Cognition Arousal/Alertness: Awake/alert Behavior During Therapy: WFL for tasks assessed/performed Overall Cognitive Status: Within Functional Limits for tasks assessed                                                  Home Living Family/patient expects to be discharged to:: Private residence Living Arrangements: Children (DTR Kaitlyn Huff lives  there, in/out during day, 2 other DTRs and SON live nearby) Available Help at Discharge: Family Type of Home: House Home Access: Stairs to enter Secretary/administrator of Steps: 5 Entrance Stairs-Rails: Can reach both       Bathroom Shower/Tub: Producer, television/film/video: Standard Bathroom Accessibility: Yes   Home Equipment: Agricultural consultant (2 wheels)          Prior Functioning/Environment Prior Level of Function : Independent/Modified Independent;Driving             Mobility Comments: dancing; RW          OT Problem List: Decreased activity tolerance;Decreased range of motion;Impaired balance (sitting and/or standing);Decreased safety awareness      OT Treatment/Interventions: Self-care/ADL training;Therapeutic exercise;Energy conservation;DME and/or AE instruction;Therapeutic activities;Balance training;Patient/family education    OT Goals(Current goals can be found in the care plan section) Acute Rehab OT Goals Patient Stated Goal: to go home OT Goal Formulation: With patient/family Time For Goal Achievement: 12/16/22 Potential to Achieve Goals: Good ADL Goals Pt Will Perform Grooming: standing;Independently Pt Will Perform Lower Body Dressing: Independently;sit to/from stand Pt Will Transfer to Toilet: ambulating;regular height toilet;Independently  OT Frequency: Min 2X/week    Co-evaluation              AM-PAC OT "6 Clicks" Daily Activity     Outcome Measure Help from another person eating meals?: None Help from another person taking care of personal grooming?: A Little Help from another person toileting, which includes using toliet, bedpan, or urinal?: A Little Help from another person bathing (including washing, rinsing, drying)?: A Little Help from another person to put on and taking off regular upper body clothing?: None Help from another person to put on and taking off regular lower body clothing?: A Little 6 Click Score: 20   End of  Session Equipment Utilized During Treatment: Rolling walker (2 wheels)  Activity Tolerance: Patient tolerated treatment well Patient left: in chair;with call bell/phone within reach;with family/visitor present  OT Visit Diagnosis: Other abnormalities of gait and mobility (R26.89)                Time: 1610-9604 OT Time Calculation (min): 20 min Charges:  OT General Charges $OT Visit: 1 Visit OT Evaluation $OT Eval Moderate Complexity: 1 Mod OT Treatments $Self Care/Home Management : 8-22 mins  Kathie Dike, M.S. OTR/L  12/02/22, 3:21 PM  ascom [phone]

## 2022-12-02 NOTE — TOC Progression Note (Signed)
Transition of Care [facility]) - Progression Note    Patient Details  Name: Kaitlyn Huff MRN: [id number] Date of Birth: January 27, 1941  [facility] of [facility] CM/SW Contact  Marlowe Sax, RN Phone Number: 12/02/2022, 2:32 PM  Clinical Narrative:   Spoke with the patient She has a rolling walker at home and needs a 3 in1  [employer] to deliver to the bedside She would like to have HH PT and [facility] accepted her as a patient, she has no preference   Her family to provide transport  Expected Discharge Plan: Home w Home Health Services Barriers to Discharge: Continued Medical Work up  Expected Discharge Plan and Services   Discharge Planning Services: CM Consult   Living arrangements for the past 2 months: Single Family Home                 DME Arranged: 3-N-1 DME Agency: [facility]       HH Arranged: PT HH Agency: [facility] ([facility]) Date HH Agency Contacted: 12/02/22 Time HH Agency Contacted: 1431 Representative spoke with at [facility] Agency: Barbara Cower   Social Determinants of Health (SDOH) Interventions SDOH Screenings   Food Insecurity: No Food Insecurity (11/30/2022)  Housing: Low Risk  (11/30/2022)  Transportation Needs: No Transportation Needs (11/30/2022)  Utilities: Not At Risk (11/30/2022)  Depression (PHQ2-9): Low Risk  (09/02/2022)  Financial Resource Strain: Low Risk  (09/02/2022)  Physical Activity: Unknown (09/02/2022)  Social Connections: Unknown (09/02/2022)  Stress: No Stress Concern Present (09/02/2022)  Tobacco Use: Low Risk  (12/01/2022)    Readmission Risk Interventions     No data to display

## 2022-12-02 NOTE — Plan of Care (Signed)

## 2022-12-02 NOTE — Consult Note (Cosign Needed Addendum)
[facility] CLINIC CARDIOLOGY CONSULT NOTE       Patient ID: [name] MRN: [id number] DOB/AGE: 10-11-1940 82 y.o.  Admit date: 11/29/2022 Referring Physician Dr. Alford Highland  Primary Physician Dr. Dale Lackawanna Primary Cardiologist Dr. Juliann Pares Reason for Consultation chest pain   HPI: Kaitlyn Huff is a 82yoF with a PMH of paroxysmal AF (declined AC), HFpEF (60-65%, g1DD 12/02/22), moderate AI, mild MR, rheumatoid arthritis, who presented to [facility] ED 11/29/2022 with bilateral leg pain. R knee XR with large suprapatellar effusion + leukocytosis on CBC, c/f septic joint. On 5/22 the patient underwent R knee arthroscopic I&D w/ partial medial meniscectomy and extensive synovectomy with Dr. Martha Clan. The evening of 5/23, the patient developed chest pain for which cardiology is consulted for further assistance.   The patient presented to [facility] ED with bilateral leg pain on 5/21.  Imaging obtained showed a large suprapatellar knee effusion on the right, CBC with leukocytosis to peak of 15.3 with concern for septic joint.  The patient underwent right knee arthroscopic incision and debridement with partial medial meniscectomy and extensive synovectomy with Dr. Martha Clan without untoward event.  The patient has a history of paroxysmal atrial fibrillation and was in AF on 5/22, although rate controlled on her home dose of Cardizem CD 180 mg daily.  On postop day 1 in the evening, the hospitalist was contacted with reports of chest pressure and mild shortness of breath.  The chest pressure was reportedly relieved with SL nitroglycerin x 2 and also later given maalox.  CTA chest obtained ruled out pulmonary embolus but did reveal cardiomegaly, coronary artery calcifications, small bilateral pleural effusions, and a moderate hiatal hernia with intrathoracic position of the gastric fundus.    At my time of evaluation this morning, the patient is laying flat in bed and eager to eat breakfast.  She feels  generally fatigued and worried this will preclude her ability to participate with physical therapy today.  She actually does not recall having chest pressure or any pain at all other than in her right knee yesterday afternoon.  She currently denies any chest pain or pressure or shortness of breath.  She denies heart racing or palpitations, lightheadedness or dizziness, orthopnea, or peripheral edema.  EKG obtained yesterday afternoon demonstrates atrial fibrillation with controlled ventricular response of 64 bpm without ischemic changes.  Troponins were borderline elevated and flat trending at 41, 43.  On telemetry the patient has been in atrial fibrillation with rate in the 60s-70s with occasional PVCs.  Echocardiogram obtained demonstrates normal LVEF with grade 1 diastolic dysfunction, no regional wall motion abnormalities, moderate AI, mild MR, and moderate TR.   Review of systems complete and found to be negative unless listed above     Past Medical History:  Diagnosis Date   Anemia    Cancer (HCC)    Diverticulitis    GERD (gastroesophageal reflux disease)    Hyperlipidemia    Hypertension    Osteoarthritis    Osteoporosis    actonel   Positive PPD    s/p INH (2006)   Rheumatoid arthritis(714.0)    MTX transaminitis, Leflunomide (rash), enbrel, plaquinil, prednisone, remicade, Imuran (transaminitis)   Valvular heart disease    moderate MR and TR    Past Surgical History:  Procedure Laterality Date   ABDOMINAL HYSTERECTOMY     CERVICAL CONE BIOPSY     CHOLECYSTECTOMY  06/22/14   COLONOSCOPY WITH PROPOFOL N/A 07/09/2015   Procedure: COLONOSCOPY WITH PROPOFOL;  Surgeon: Wilber Bihari  Mechele Collin, MD;  Location: [facility] ENDOSCOPY;  Service: Endoscopy;  Laterality: N/A;   ESOPHAGOGASTRODUODENOSCOPY (EGD) WITH PROPOFOL N/A 07/09/2015   Procedure: ESOPHAGOGASTRODUODENOSCOPY (EGD) WITH PROPOFOL;  Surgeon: Scot Jun, MD;  Location: [facility] ENDOSCOPY;  Service: Endoscopy;  Laterality: N/A;    IRRIGATION AND DEBRIDEMENT KNEE Right 11/30/2022   Procedure: RIGHT KNEE ARTHROSCOPIC WASHOUT;  Surgeon: Juanell Fairly, MD;  Location: [facility] ORS;  Service: Orthopedics;  Laterality: Right;   OVARY SURGERY     TRACHEOSTOMY  1959   TUBAL LIGATION      Medications Prior to Admission  Medication Sig Dispense Refill Last Dose   albuterol (VENTOLIN HFA) 108 (90 Base) MCG/ACT inhaler USE 2 INHALATIONS EVERY 6 HOURS AS NEEDED FOR WHEEZING OR SHORTNESS OF BREATH 51 g 3 unk at un   ALPRAZolam (XANAX) 0.25 MG tablet Take 1 tablet (0.25 mg total) by mouth 2 (two) times daily as needed for anxiety. 20 tablet 0 unk at unk   atorvastatin (LIPITOR) 20 MG tablet Take 20 mg by mouth daily.   Past Week   cetirizine (ZYRTEC) 10 MG tablet Take 1 tablet (10 mg total) by mouth daily. 90 tablet 3 Past Week   colestipol (COLESTID) 1 g tablet Take 2 g by mouth daily.   Past Week   diltiazem (CARDIZEM CD) 180 MG 24 hr capsule Take 180 mg by mouth daily.   Past Week   diltiazem (CARDIZEM) 30 MG tablet Take 30 mg by mouth daily as needed.   unk at unk   fluticasone (FLONASE) 50 MCG/ACT nasal spray USE 2 SPRAYS IN EACH NOSTRIL DAILY (CALL OFFICE SOON TO RESCHEDULE YOUR PHYSICAL) 48 g 3 Past Week   gabapentin (NEURONTIN) 100 MG capsule Take 100 mg by mouth daily.   Past Week   lip balm (BLISTEX) OINT Apply 1 application topically as needed for lip care. 1 Tube 0 unk at unk   Multiple Vitamin (MULTIVITAMIN WITH MINERALS) TABS tablet Take 1 tablet by mouth daily.   Past Week   omeprazole (PRILOSEC) 20 MG capsule Take 1 capsule by mouth daily.   Past Week   Probiotic Product (ALIGN PO) Take 1 tablet by mouth daily.   Past Week   spironolactone (ALDACTONE) 25 MG tablet Take 12.5 mg by mouth daily.   Past Week   Social History   Socioeconomic History   Marital status: Widowed    Spouse name: Not on file   Number of children: 4   Years of education: Not on file   Highest education level: Not on file  Occupational History    Occupation: retired  Tobacco Use   Smoking status: Never   Smokeless tobacco: Never  Substance and Sexual Activity   Alcohol use: No    Alcohol/week: 0.0 standard drinks of alcohol   Drug use: No   Sexual activity: Never  Other Topics Concern   Not on file  Social History Narrative   Lives with family at home   Social Determinants of Health   Financial Resource Strain: Low Risk  (09/02/2022)   Overall Financial Resource Strain (CARDIA)    Difficulty of Paying Living Expenses: Not hard at all  Food Insecurity: No Food Insecurity (11/30/2022)   Hunger Vital Sign    Worried About Running Out of Food in the Last Year: Never true    Ran Out of Food in the Last Year: Never true  Transportation Needs: No Transportation Needs (11/30/2022)   PRAPARE - Administrator, Civil Service (Medical): No  Lack of Transportation (Non-Medical): No  Physical Activity: Unknown (09/02/2022)   Exercise Vital Sign    Days of Exercise per Week: 0 days    Minutes of Exercise per Session: Not on file  Stress: No Stress Concern Present (09/02/2022)   [employer] of Occupational Health - Occupational Stress Questionnaire    Feeling of Stress : Not at all  Social Connections: Unknown (09/02/2022)   Social Connection and Isolation Panel [NHANES]    Frequency of Communication with Friends and Family: More than three times a week    Frequency of Social Gatherings with Friends and Family: More than three times a week    Attends Religious Services: Not on file    Active Member of Clubs or Organizations: Yes    Attends Banker Meetings: Not on file    Marital Status: Not on file  Intimate Partner Violence: Not At Risk (11/30/2022)   Humiliation, Afraid, Rape, and Kick questionnaire    Fear of Current or Ex-Partner: No    Emotionally Abused: No    Physically Abused: No    Sexually Abused: No    Family History  Problem Relation Age of Onset   Heart disease Father        MI    Heart disease Mother    Valvular heart disease Mother    Breast cancer Sister 75   Cancer Sister        Lung cancer   COPD Sister    Heart disease Sister    Colon cancer Neg Hx       Intake/Output Summary (Last 24 hours) at 12/02/2022 0803 Last data filed at 12/02/2022 0749 Gross per 24 hour  Intake 200 ml  Output 200 ml  Net 0 ml    Vitals:   12/01/22 1554 12/01/22 1741 12/01/22 2332 12/02/22 0728  BP: (!) 120/58 (!) 111/43 122/72 131/67  Pulse: (!) 59 62 70 76  Resp: 18  19 16   Temp: 97.9 F (36.6 C)  99.5 F (37.5 C) 98 F (36.7 C)  TempSrc: Oral     SpO2: 93% 94% 94% 94%  Weight:      Height:        PHYSICAL EXAM General: Pleasant elderly Caucasian female, well nourished, in no acute distress.  Laying flat in bed HEENT:  Normocephalic and atraumatic. Neck:  No JVD.  Lungs: Normal respiratory effort on room air. Clear bilaterally to auscultation. No wheezes, crackles, rhonchi.  Chest: Anterior chest wall nontender to palpation Heart: Irregularly irregular with controlled rate.  Loud S1 and S2 with 2/6 systolic murmur heard at the LUSB Abdomen: Non-distended appearing.  Msk: Generalized weakness Extremities: Right knee with postoperative cold compress dressing, without significant extremity edema bilaterally. Neuro: Alert and oriented X 3. Psych:  somewhat tearful when discussing some family members also in poor health. Cooperative.   Labs: Basic Metabolic Panel: Recent Labs    11/29/22 1500 11/30/22 0547 12/01/22 0455 12/02/22 0542  NA 131*  --   --  125*  K 4.7  --   --  4.1  CL 95*  --   --  94*  CO2 25  --   --  23  GLUCOSE 136*  --   --  129*  BUN 14  --   --  27*  CREATININE 1.04*   < > 1.03* 0.91  CALCIUM 9.4  --   --  8.2*   < > = values in this interval not displayed.   Liver  Function Tests: No results for input(s): "AST", "ALT", "ALKPHOS", "BILITOT", "PROT", "ALBUMIN" in the last 72 hours. No results for input(s): "LIPASE", "AMYLASE" in the  last 72 hours. CBC: Recent Labs    11/29/22 1500 11/30/22 0547 12/01/22 0913  WBC 13.9* 15.3* 13.2*  NEUTROABS 10.5* 11.7*  --   HGB 14.1 13.5 12.0  HCT 41.2 39.1 34.4*  MCV 93.0 92.9 91.7  PLT 243 239 212   Cardiac Enzymes: Recent Labs    12/01/22 1758 12/01/22 2025  TROPONINIHS 41* 43*   BNP: No results for input(s): "BNP" in the last 72 hours. D-Dimer: No results for input(s): "DDIMER" in the last 72 hours. Hemoglobin A1C: No results for input(s): "HGBA1C" in the last 72 hours. Fasting Lipid Panel: No results for input(s): "CHOL", "HDL", "LDLCALC", "TRIG", "CHOLHDL", "LDLDIRECT" in the last 72 hours. Thyroid Function Tests: No results for input(s): "TSH", "T4TOTAL", "T3FREE", "THYROIDAB" in the last 72 hours.  Invalid input(s): "FREET3" Anemia Panel: No results for input(s): "VITAMINB12", "FOLATE", "FERRITIN", "TIBC", "IRON", "RETICCTPCT" in the last 72 hours.   Radiology: ECHOCARDIOGRAM COMPLETE  Result Date: 12/02/2022    ECHOCARDIOGRAM REPORT   Patient Name:   KHLOEI PALELLA [facility] Date of Exam: 12/01/2022 Medical Rec #:  [id number]      Height:       65.0 in Accession #:    [id number]     Weight:       124.8 lb Date of Birth:  12-Dec-1940     BSA:          1.619 m Patient Age:    75 years       BP:           125/61 mmHg Patient Gender: F              HR:           65 bpm. Exam Location:  [facility] Procedure: 2D Echo, Cardiac Doppler and Color Doppler Indications:     Atrial Flutter  History:         Patient has prior history of Echocardiogram examinations, most                  recent 11/23/2020. Arrythmias:Atrial Fibrillation, Atrial                  Flutter and Tachycardia,                  Signs/Symptoms:Dizziness/Lightheadedness, Fatigue, Edema and                  Dyspnea; Risk Factors:Hypertension and Sleep Apnea.  Sonographer:     Mikki Harbor Referring Phys:  [id number] Alford Highland Diagnosing Phys: Alwyn Pea MD IMPRESSIONS  1. Left ventricular ejection fraction, by  estimation, is 60 to 65%. The left ventricle has normal function. The left ventricle has no regional wall motion abnormalities. There is mild concentric left ventricular hypertrophy. Left ventricular diastolic parameters are consistent with Grade I diastolic dysfunction (impaired relaxation).  2. Right ventricular systolic function is normal. The right ventricular size is normal. There is mildly elevated pulmonary artery systolic pressure.  3. Left atrial size was mildly dilated.  4. Right atrial size was mildly dilated.  5. The mitral valve is normal in structure. Mild mitral valve regurgitation.  6. Tricuspid valve regurgitation is moderate.  7. The aortic valve is grossly normal. Aortic valve regurgitation is mild to moderate. Aortic valve sclerosis is present, with no evidence of aortic valve stenosis. FINDINGS  Left Ventricle: Left ventricular ejection fraction, by estimation, is 60 to 65%. The left ventricle has normal function. The left ventricle has no regional wall motion abnormalities. The left ventricular internal cavity size was normal in size. There is  mild concentric left ventricular hypertrophy. Left ventricular diastolic parameters are consistent with Grade I diastolic dysfunction (impaired relaxation). Right Ventricle: The right ventricular size is normal. No increase in right ventricular wall thickness. Right ventricular systolic function is normal. There is mildly elevated pulmonary artery systolic pressure. The tricuspid regurgitant velocity is 2.87  m/s, and with an assumed right atrial pressure of 8 mmHg, the estimated right ventricular systolic pressure is 40.9 mmHg. Left Atrium: Left atrial size was mildly dilated. Right Atrium: Right atrial size was mildly dilated. Pericardium: There is no evidence of pericardial effusion. Mitral Valve: The mitral valve is normal in structure. Mild mitral valve regurgitation. MV peak gradient, 3.2 mmHg. The mean mitral valve gradient is 1.0 mmHg. Tricuspid  Valve: The tricuspid valve is grossly normal. Tricuspid valve regurgitation is moderate. Aortic Valve: The aortic valve is grossly normal. Aortic valve regurgitation is mild to moderate. Aortic valve sclerosis is present, with no evidence of aortic valve stenosis. Aortic valve mean gradient measures 5.0 mmHg. Aortic valve peak gradient measures 11.9 mmHg. Aortic valve area, by VTI measures 1.95 cm. Pulmonic Valve: The pulmonic valve was grossly normal. Pulmonic valve regurgitation is mild. Aorta: The ascending aorta was not well visualized. IAS/Shunts: No atrial level shunt detected by color flow Doppler.  LEFT VENTRICLE PLAX 2D LVIDd:         4.70 cm LVIDs:         3.00 cm LV PW:         1.20 cm LV IVS:        1.20 cm LVOT diam:     1.90 cm LV SV:         58 LV SV Index:   36 LVOT Area:     2.84 cm  RIGHT VENTRICLE RV Basal diam:  3.90 cm RV Mid diam:    2.70 cm RV S prime:     11.20 cm/s LEFT ATRIUM              Index        RIGHT ATRIUM           Index LA diam:        4.60 cm  2.84 cm/m   RA Area:     27.80 cm LA Vol (A2C):   108.0 ml 66.72 ml/m  RA Volume:   83.30 ml  51.46 ml/m LA Vol (A4C):   58.9 ml  36.39 ml/m LA Biplane Vol: 80.0 ml  49.42 ml/m  AORTIC VALVE                    PULMONIC VALVE AV Area (Vmax):    1.82 cm     PV Vmax:       1.37 m/s AV Area (Vmean):   1.86 cm     PV Peak grad:  7.5 mmHg AV Area (VTI):     1.95 cm AV Vmax:           172.50 cm/s AV Vmean:          96.800 cm/s AV VTI:            0.294 m AV Peak Grad:      11.9 mmHg AV Mean Grad:      5.0 mmHg LVOT Vmax:  111.00 cm/s LVOT Vmean:        63.400 cm/s LVOT VTI:          0.203 m LVOT/AV VTI ratio: 0.69  AORTA Ao Root diam: 3.40 cm Ao Asc diam:  3.70 cm MITRAL VALVE               TRICUSPID VALVE MV Area (PHT): 3.53 cm    TR Peak grad:   32.9 mmHg MV Area VTI:   2.90 cm    TR Vmax:        287.00 cm/s MV Peak grad:  3.2 mmHg MV Mean grad:  1.0 mmHg    SHUNTS MV Vmax:       0.89 m/s    Systemic VTI:  0.20 m MV Vmean:       53.6 cm/s   Systemic Diam: 1.90 cm MV Decel Time: 215 msec MV E velocity: 71.00 cm/s Alwyn Pea MD Electronically signed by Alwyn Pea MD Signature Date/Time: 12/02/2022/8:00:44 AM    Final    CT Angio Chest Pulmonary Embolism (PE) W or WO Contrast  Result Date: 12/01/2022 CLINICAL DATA:  PE suspected, negative D-dimer EXAM: CT ANGIOGRAPHY CHEST WITH CONTRAST TECHNIQUE: Multidetector CT imaging of the chest was performed using the standard protocol during bolus administration of intravenous contrast. Multiplanar CT image reconstructions and MIPs were obtained to evaluate the vascular anatomy. RADIATION DOSE REDUCTION: This exam was performed according to the departmental dose-optimization program which includes automated exposure control, adjustment of the mA and/or kV according to patient size and/or use of iterative reconstruction technique. CONTRAST:  75mL OMNIPAQUE IOHEXOL 350 MG/ML SOLN COMPARISON:  None Available. FINDINGS: Cardiovascular: Satisfactory opacification of the pulmonary arteries to the segmental level. No evidence of pulmonary embolism. Cardiomegaly. Three-vessel coronary artery calcifications. No pericardial effusion. Mediastinum/Nodes: No enlarged mediastinal, hilar, or axillary lymph nodes. Moderate hiatal hernia with intrathoracic position of the gastric fundus. Thyroid gland, trachea, and esophagus demonstrate no significant findings. Lungs/Pleura: Small bilateral pleural effusions and associated atelectasis or consolidation Upper Abdomen: No acute abnormality.  Status post cholecystectomy. Musculoskeletal: No chest wall abnormality. No acute osseous findings. Review of the MIP images confirms the above findings. IMPRESSION: 1. Negative examination for pulmonary embolism. 2. Small bilateral pleural effusions and associated atelectasis or consolidation. 3. Cardiomegaly and coronary artery disease. 4. Moderate hiatal hernia with intrathoracic position of the gastric fundus.  Electronically Signed   By: Jearld Lesch M.D.   On: 12/01/2022 19:12   DG Cervical Spine 2 or 3 views  Result Date: 12/01/2022 CLINICAL DATA:  Neck pain EXAM: CERVICAL SPINE - 2-3 VIEW COMPARISON:  CT 06/07/15 FINDINGS: No acute posttraumatic malalignment of the cervical spine. Unchanged stepwise first degree anterolisthesis of C4 on C5 and C5 on C6. disc space narrowing and endplate spurring is identified at C5-6 and C6-7. Bilateral multilevel facet arthropathy identified. No signs of acute fracture or dislocation. Prevertebral soft tissues are unremarkable. Left-sided carotid artery calcifications noted. IMPRESSION: 1. No acute findings. 2. 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 3. Left-sided carotid artery calcifications. Consider further evaluation with carotid ultrasound. Electronically Signed   By: Signa Kell M.D.   On: 12/01/2022 15:30   DG Knee Complete 4 Views Right  Result Date: 11/29/2022 CLINICAL DATA:  Bilateral knee pain. EXAM: RIGHT KNEE - COMPLETE 4+ VIEW COMPARISON:  February 27, 2019. FINDINGS: No definite fracture or dislocation is noted. Large suprapatellar joint effusion is noted. 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 IMPRESSION:  Large suprapatellar joint effusion. Mild degenerative change is noted medially. No fracture or dislocation. Electronically Signed   By: Lupita Raider M.D.   On: 11/29/2022 14:36    Steffanie Dunn 08/2015 Narrative & Impression  The study is normal. This is a low risk study. The left ventricular ejection fraction is normal (55-65%). There was no ST segment deviation noted during stress.    TELEMETRY reviewed by me (LT) 12/02/2022 : AF rate 60s-70s with occasional PVCs  EKG reviewed by me: AF rate 64   Data reviewed by me (LT) 12/02/2022: Last outpatient cardiology note, orthopedic surgery note, hospitalist progress note last 24h vitals tele labs imaging I/O   Principal Problem:   Acute pain of  right knee Active Problems:   Hypercholesterolemia   Rheumatoid arthritis (HCC)   GERD (gastroesophageal reflux disease)   Hypertension   Obstructive sleep apnea   Hyponatremia   Paroxysmal atrial fibrillation (HCC)   Chest pain    ASSESSMENT AND PLAN:  Kaitlyn Huff is a 82yoF with a PMH of paroxysmal AF (declined AC), HFpEF (60-65%, g1DD 12/02/22), moderate AI, mild MR, rheumatoid arthritis, who presented to [facility] ED 11/29/2022 with bilateral leg pain. R knee XR with large suprapatellar effusion + leukocytosis on CBC, c/f septic joint. On 5/22 the patient underwent R knee arthroscopic I&D w/ partial medial meniscectomy and extensive synovectomy with Dr. Martha Clan. The evening of 5/23, the patient developed chest pain for which cardiology is consulted for further assistance.   # R knee pain s/p arthroscopic I&D 5/22 WBCs downtrending, on IV antibiotics.  -Encouraged participation with PT/OT -Management per primary team/orthopedic surgery  # Nonspecific chest pain  # Coronary artery calcifications The patient reported chest pressure with some mild shortness of breath the evening of 5/23.  CTA negative for pulmonary embolus but did show three-vessel coronary artery calcifications and a moderate hiatal hernia.  During interview today the patient does not remember having any chest pressure and does not currently have chest pressure.  Troponins are borderline elevated and flat trending at 41, 43.  EKGs demonstrate AF with controlled ventricular response without ischemic changes.  Echo with normal LVEF without RWMA's mild to moderate valvular insufficiencies.  At this juncture, would recommend close outpatient follow-up with cardiology for consideration of coronary CTA (history of normal Lexiscan Myoview 08/2015) and further risk factor modification. -Continue aspirin 81 mg daily, indefinitely -Defer heparin infusion -Continue atorvastatin 20 mg daily -Agree with continuing Protonix and as needed  Maalox -Continue diltiazem for rate control -Continue Spiro 12.5 mg daily. -LDL good control (49) A1c 2/95 was 6.4% -Defer additional cardiac diagnostics at this time -Will arrange for earlier follow up with Dr. Darrold Junker in 1-2 weeks (currently scheduled for 6/25 at 3:30pm)   # paroxysmal AF  Rate controlled on telemetry in the 60s-70s. Echo with mild LA dilation.  -Continue Cardizem CD 180 mg daily -The patient continues to decline chronic anticoagulation, citing history of GI bleed, despite the elevated stroke risk.   # Chronic HFpEF # moderate AI, TR, mild MR  Euvolemic on exam.  -Continue spiro 12.5mg  daily, resume home lasix at 20mg  PO tomorrow   This patient's plan of care was discussed and created with Dr. Juliann Pares and he is in agreement.  Signed: Rebeca Allegra , PA-C 12/02/2022, 8:03 AM [facility] Cardiology

## 2022-12-02 NOTE — Progress Notes (Addendum)
Subjective:  POD #2 s/p arthroscopic I+D of right knee. Family member is at the bedside.  Patient reports right knee pain as mild to moderate.  Patient denies current chest pain.  Complains of mild left knee pain without swelling.  Objective:   VITALS:   Vitals:   12/01/22 1554 12/01/22 1741 12/01/22 2332 12/02/22 0728  BP: (!) 120/58 (!) 111/43 122/72 131/67  Pulse: (!) 59 62 70 76  Resp: 18  19 16   Temp: 97.9 F (36.6 C)  99.5 F (37.5 C) 98 F (36.7 C)  TempSrc: Oral     SpO2: 93% 94% 94% 94%  Weight:      Height:        PHYSICAL EXAM: Right lower extremity: I personally changed the patient's dressing Neurovascular intact Sensation intact distally Intact pulses distally Dorsiflexion/Plantar flexion intact Incision: no drainage, erythema or ecchymosis No cellulitis present Compartment soft No swelling/effusion of left knee.  NVI.    LABS  Results for orders placed or performed during the hospital encounter of 11/29/22 (from the past 24 hour(s))  Troponin I (High Sensitivity)     Status: Abnormal   Collection Time: 12/01/22  5:58 PM  Result Value Ref Range   Troponin I (High Sensitivity) 41 (H) <18 ng/L  Troponin I (High Sensitivity)     Status: Abnormal   Collection Time: 12/01/22  8:25 PM  Result Value Ref Range   Troponin I (High Sensitivity) 43 (H) <18 ng/L  Basic metabolic panel     Status: Abnormal   Collection Time: 12/02/22  5:42 AM  Result Value Ref Range   Sodium 125 (L) 135 - 145 mmol/L   Potassium 4.1 3.5 - 5.1 mmol/L   Chloride 94 (L) 98 - 111 mmol/L   CO2 23 22 - 32 mmol/L   Glucose, Bld 129 (H) 70 - 99 mg/dL   BUN 27 (H) 8 - 23 mg/dL   Creatinine, Ser 1.61 0.44 - 1.00 mg/dL   Calcium 8.2 (L) 8.9 - 10.3 mg/dL   GFR, Estimated >09 >60 mL/min   Anion gap 8 5 - 15  Lipid panel     Status: None   Collection Time: 12/02/22  5:42 AM  Result Value Ref Range   Cholesterol 132 0 - 200 mg/dL   Triglycerides 91 <454 mg/dL   HDL 65 >09 mg/dL    Total CHOL/HDL Ratio 2.0 RATIO   VLDL 18 0 - 40 mg/dL   LDL Cholesterol 49 0 - 99 mg/dL  CBC     Status: Abnormal   Collection Time: 12/02/22  7:53 AM  Result Value Ref Range   WBC 12.5 (H) 4.0 - 10.5 K/uL   RBC 3.68 (L) 3.87 - 5.11 MIL/uL   Hemoglobin 11.7 (L) 12.0 - 15.0 g/dL   HCT 81.1 (L) 91.4 - 78.2 %   MCV 91.0 80.0 - 100.0 fL   MCH 31.8 26.0 - 34.0 pg   MCHC 34.9 30.0 - 36.0 g/dL   RDW 95.6 21.3 - 08.6 %   Platelets 245 150 - 400 K/uL   nRBC 0.0 0.0 - 0.2 %    ECHOCARDIOGRAM COMPLETE  Result Date: 12/02/2022    ECHOCARDIOGRAM REPORT   Patient Name:   ADHIRA KELLAMS [facility] Date of Exam: 12/01/2022 Medical Rec #:  [id number]      Height:       65.0 in Accession #:    [id number]     Weight:       124.8 lb Date of  Birth:  21-Mar-1941     BSA:          1.619 m Patient Age:    82 years       BP:           125/61 mmHg Patient Gender: F              HR:           65 bpm. Exam Location:  [facility] Procedure: 2D Echo, Cardiac Doppler and Color Doppler Indications:     Atrial Flutter  History:         Patient has prior history of Echocardiogram examinations, most                  recent 11/23/2020. Arrythmias:Atrial Fibrillation, Atrial                  Flutter and Tachycardia,                  Signs/Symptoms:Dizziness/Lightheadedness, Fatigue, Edema and                  Dyspnea; Risk Factors:Hypertension and Sleep Apnea.  Sonographer:     Mikki Harbor Referring Phys:  [id number] Alford Highland Diagnosing Phys: Alwyn Pea MD IMPRESSIONS  1. Left ventricular ejection fraction, by estimation, is 60 to 65%. The left ventricle has normal function. The left ventricle has no regional wall motion abnormalities. There is mild concentric left ventricular hypertrophy. Left ventricular diastolic parameters are consistent with Grade I diastolic dysfunction (impaired relaxation).  2. Right ventricular systolic function is normal. The right ventricular size is normal. There is mildly elevated pulmonary artery systolic  pressure.  3. Left atrial size was mildly dilated.  4. Right atrial size was mildly dilated.  5. The mitral valve is normal in structure. Mild mitral valve regurgitation.  6. Tricuspid valve regurgitation is moderate.  7. The aortic valve is grossly normal. Aortic valve regurgitation is mild to moderate. Aortic valve sclerosis is present, with no evidence of aortic valve stenosis. FINDINGS  Left Ventricle: Left ventricular ejection fraction, by estimation, is 60 to 65%. The left ventricle has normal function. The left ventricle has no regional wall motion abnormalities. The left ventricular internal cavity size was normal in size. There is  mild concentric left ventricular hypertrophy. Left ventricular diastolic parameters are consistent with Grade I diastolic dysfunction (impaired relaxation). Right Ventricle: The right ventricular size is normal. No increase in right ventricular wall thickness. Right ventricular systolic function is normal. There is mildly elevated pulmonary artery systolic pressure. The tricuspid regurgitant velocity is 2.87  m/s, and with an assumed right atrial pressure of 8 mmHg, the estimated right ventricular systolic pressure is 40.9 mmHg. Left Atrium: Left atrial size was mildly dilated. Right Atrium: Right atrial size was mildly dilated. Pericardium: There is no evidence of pericardial effusion. Mitral Valve: The mitral valve is normal in structure. Mild mitral valve regurgitation. MV peak gradient, 3.2 mmHg. The mean mitral valve gradient is 1.0 mmHg. Tricuspid Valve: The tricuspid valve is grossly normal. Tricuspid valve regurgitation is moderate. Aortic Valve: The aortic valve is grossly normal. Aortic valve regurgitation is mild to moderate. Aortic valve sclerosis is present, with no evidence of aortic valve stenosis. Aortic valve mean gradient measures 5.0 mmHg. Aortic valve peak gradient measures 11.9 mmHg. Aortic valve area, by VTI measures 1.95 cm. Pulmonic Valve: The pulmonic  valve was grossly normal. Pulmonic valve regurgitation is mild. Aorta: The ascending aorta was not well  visualized. IAS/Shunts: No atrial level shunt detected by color flow Doppler.  LEFT VENTRICLE PLAX 2D LVIDd:         4.70 cm LVIDs:         3.00 cm LV PW:         1.20 cm LV IVS:        1.20 cm LVOT diam:     1.90 cm LV SV:         58 LV SV Index:   36 LVOT Area:     2.84 cm  RIGHT VENTRICLE RV Basal diam:  3.90 cm RV Mid diam:    2.70 cm RV S prime:     11.20 cm/s LEFT ATRIUM              Index        RIGHT ATRIUM           Index LA diam:        4.60 cm  2.84 cm/m   RA Area:     27.80 cm LA Vol (A2C):   108.0 ml 66.72 ml/m  RA Volume:   83.30 ml  51.46 ml/m LA Vol (A4C):   58.9 ml  36.39 ml/m LA Biplane Vol: 80.0 ml  49.42 ml/m  AORTIC VALVE                    PULMONIC VALVE AV Area (Vmax):    1.82 cm     PV Vmax:       1.37 m/s AV Area (Vmean):   1.86 cm     PV Peak grad:  7.5 mmHg AV Area (VTI):     1.95 cm AV Vmax:           172.50 cm/s AV Vmean:          96.800 cm/s AV VTI:            0.294 m AV Peak Grad:      11.9 mmHg AV Mean Grad:      5.0 mmHg LVOT Vmax:         111.00 cm/s LVOT Vmean:        63.400 cm/s LVOT VTI:          0.203 m LVOT/AV VTI ratio: 0.69  AORTA Ao Root diam: 3.40 cm Ao Asc diam:  3.70 cm MITRAL VALVE               TRICUSPID VALVE MV Area (PHT): 3.53 cm    TR Peak grad:   32.9 mmHg MV Area VTI:   2.90 cm    TR Vmax:        287.00 cm/s MV Peak grad:  3.2 mmHg MV Mean grad:  1.0 mmHg    SHUNTS MV Vmax:       0.89 m/s    Systemic VTI:  0.20 m MV Vmean:      53.6 cm/s   Systemic Diam: 1.90 cm MV Decel Time: 215 msec MV E velocity: 71.00 cm/s Alwyn Pea MD Electronically signed by Alwyn Pea MD Signature Date/Time: 12/02/2022/8:00:44 AM    Final    CT Angio Chest Pulmonary Embolism (PE) W or WO Contrast  Result Date: 12/01/2022 CLINICAL DATA:  PE suspected, negative D-dimer EXAM: CT ANGIOGRAPHY CHEST WITH CONTRAST TECHNIQUE: Multidetector CT imaging of the chest  was performed using the standard protocol during bolus administration of intravenous contrast. Multiplanar CT image reconstructions and MIPs were obtained to evaluate the vascular anatomy. RADIATION  DOSE REDUCTION: This exam was performed according to the departmental dose-optimization program which includes automated exposure control, adjustment of the mA and/or kV according to patient size and/or use of iterative reconstruction technique. CONTRAST:  75mL OMNIPAQUE IOHEXOL 350 MG/ML SOLN COMPARISON:  None Available. FINDINGS: Cardiovascular: Satisfactory opacification of the pulmonary arteries to the segmental level. No evidence of pulmonary embolism. Cardiomegaly. Three-vessel coronary artery calcifications. No pericardial effusion. Mediastinum/Nodes: No enlarged mediastinal, hilar, or axillary lymph nodes. Moderate hiatal hernia with intrathoracic position of the gastric fundus. Thyroid gland, trachea, and esophagus demonstrate no significant findings. Lungs/Pleura: Small bilateral pleural effusions and associated atelectasis or consolidation Upper Abdomen: No acute abnormality.  Status post cholecystectomy. Musculoskeletal: No chest wall abnormality. No acute osseous findings. Review of the MIP images confirms the above findings. IMPRESSION: 1. Negative examination for pulmonary embolism. 2. Small bilateral pleural effusions and associated atelectasis or consolidation. 3. Cardiomegaly and coronary artery disease. 4. Moderate hiatal hernia with intrathoracic position of the gastric fundus. Electronically Signed   By: Jearld Lesch M.D.   On: 12/01/2022 19:12   DG Cervical Spine 2 or 3 views  Result Date: 12/01/2022 CLINICAL DATA:  Neck pain EXAM: CERVICAL SPINE - 2-3 VIEW COMPARISON:  CT 06/07/15 FINDINGS: No acute posttraumatic malalignment of the cervical spine. Unchanged stepwise first degree anterolisthesis of C4 on C5 and C5 on C6. disc space narrowing and endplate spurring is identified at C5-6 and  C6-7. Bilateral multilevel facet arthropathy identified. No signs of acute fracture or dislocation. Prevertebral soft tissues are unremarkable. Left-sided carotid artery calcifications noted. IMPRESSION: 1. No acute findings. 2. 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 3. Left-sided carotid artery calcifications. Consider further evaluation with carotid ultrasound. Electronically Signed   By: Signa Kell M.D.   On: 12/01/2022 15:30    Assessment/Plan: 2 Days Post-Op   Principal Problem:   Acute pain of right knee Active Problems:   Hypercholesterolemia   Rheumatoid arthritis flare (HCC)   GERD (gastroesophageal reflux disease)   Hypertension   Obstructive sleep apnea   Hyponatremia   Paroxysmal atrial fibrillation (HCC)   Chest pain   Right knee cultures are negative to date.  Knee pain could be RA flare.  Started on solumedrol daily injection. Continue PT while an inpatient.  May be discharged from an orthopaedic standpoint when cleared medically.  Follow up in 7-10 days at [facility] for suture removal.  Patient should follow up with PCP or rheumatologist for RA treatment.  Continue antibiotics until final cultures available.   Juanell Fairly , MD 12/02/2022, 3:13 PM

## 2022-12-02 NOTE — Progress Notes (Signed)
Patient is not able to walk the distance required to go the bathroom, or he/she is unable to safely negotiate stairs required to access the bathroom.  A 3in1 BSC will alleviate this problem  

## 2022-12-02 NOTE — Care Management Important Message (Signed)
Important Message  Patient Details  Name: Kaitlyn Huff MRN: [id number] Date of Birth: 1941-02-15   Medicare Important Message Given:  N/A - LOS <3 / Initial given by admissions     Olegario Messier A Perrin Gens 12/02/2022, 12:56 PM

## 2022-12-02 NOTE — Assessment & Plan Note (Addendum)
Resolved.  Follow-up with cardiology as outpatient.  Started on aspirin.  Troponins only borderline but flat.

## 2022-12-02 NOTE — Progress Notes (Signed)
OT Cancellation Note  Patient Details Name: Kaitlyn Huff MRN: [id number] DOB: 23-May-1941   Cancelled Treatment:    Reason Eval/Treat Not Completed: Pain limiting ability to participate. Order received, chart reviewed. Upon arrival pt reporting 10/10 L arm pain, RN notified, will re-attempt as able to initiate services.  Kathie Dike, M.S. OTR/L  12/02/22, 11:20 AM  ascom [phone]

## 2022-12-02 NOTE — Progress Notes (Signed)
Pharmacy Antibiotic Note  Kaitlyn Huff is a 82 y.o. female admitted on 11/29/2022 with  septic joint .  Pharmacy has been consulted for Vancomycin dosing.  Today, 12/02/2022 Day #3 antibiotics - ceftriaxone and vancomycin Renal: SCr 0.91 WBC 12.5 Tm/24h 99.5 Knee aspiration: 38K WBC, 80% neutrophils and no crystals Orthopedic surgery took for washout of knee 5/22 Patient with documented amoxicillin allergy (documented as rash) but has tolerated IV and PO cephalosporins on multiple occasions  Plan: Continue vancomycin 750 mg IV every 24 hours Estimated AUC = 442.7 (goall 400-600) Using SCr 0.91 and TBW to calculate Ke, CrCL as weight less than IBW Estimated Vanc trough = 11.2  Follow renal function and check levels if remains on vancomycin > 4-5 days (soon if significant change in renal function )  Ceftriaxone 2 grams VI every 24 hours order by provider Follow cultures  Height: 5\' 5"  (165.1 cm) Weight: 56.6 kg (124 lb 12.5 oz) IBW/kg (Calculated) : 57  Temp (24hrs), Avg:98.5 F (36.9 C), Min:97.9 F (36.6 C), Max:99.5 F (37.5 C)  Recent Labs  Lab 11/29/22 1500 11/29/22 2210 11/29/22 2337 11/30/22 0547 12/01/22 0455 12/01/22 0913 12/02/22 0542 12/02/22 0753  WBC 13.9*  --   --  15.3*  --  13.2*  --  12.5*  CREATININE 1.04*  --   --  0.94 1.03*  --  0.91  --   LATICACIDVEN  --  1.9 1.4  --   --   --   --   --      Estimated Creatinine Clearance: 44.8 mL/min (by C-G formula based on SCr of 0.91 mg/dL).    Allergies  Allergen Reactions   Astelin [Azelastine Hcl] Other (See Comments)    Reaction:  Unknown    Codeine Other (See Comments)    Reaction:  Altered mental status   Dilaudid [Hydromorphone] Other (See Comments)    Reaction:  Unknown    Flexeril [Cyclobenzaprine] Other (See Comments)    Reaction:  Unknown    Imuran [Azathioprine] Other (See Comments)    Reaction:  Abnormal liver function   Lisinopril Itching   Lyrica [Pregabalin] Other (See Comments)     Reaction:  Sore gums    Methotrexate Derivatives Other (See Comments)    Reaction:  Abnormal liver function   Amiodarone Anxiety and Other (See Comments)    Pt could not eat or sleep. Caused n/v and "messed her up"   Amoxicillin Rash and Other (See Comments)    Patient has tolerated multiple cepalosporins Unable to obtain enough information to answer additional questions about this medication.     Arava [Leflunomide] Rash   Clindamycin/Lincomycin Rash   Doxycycline Rash   Lodine [Etodolac] Rash   Percocet [Oxycodone-Acetaminophen] Rash   Sulfa Antibiotics Rash    Antimicrobials this admission:  5/22 aztreonam x1 5/22 ceftriaxone >>   5/22 vancomycin >>   Dose adjustments this admission:   Microbiology results: 5/21 BCx: NGTD 5/21 synovial knee fluid: NGTD 5/22 OR culture of knee: NGTD  Thank you for allowing pharmacy to be a part of this patient's care.  Barrie Folk, PharmD 12/02/2022 11:40 AM

## 2022-12-02 NOTE — Progress Notes (Signed)
PT Cancellation Note  Patient Details Name: Kaitlyn Huff MRN: [id number] DOB: Jan 15, 1941   Cancelled Treatment:    Reason Eval/Treat Not Completed: Fatigue/lethargy limiting ability to participate;Other (comment) (Pt seen early in day, having lots of LUE pain, deferred PT to later time to allow forimproved Na+ levels from 125. 2nd attempt, pt found up to recliner, son in room. Pt reports just finished AMB in hallway with OT, would like to rest for now. Will continue to follow, resume services at later date/time.   2:57 PM, 12/02/22 Rosamaria Lints, PT, DPT Physical Therapist - [facility]  [phone] (ASCOM)     [name] 12/02/2022, 2:56 PM

## 2022-12-02 NOTE — Consult Note (Signed)
NAME: Kaitlyn Huff  DOB: Jan 17, 1941  MRN: [id number]  Date/Time: 12/02/2022 3:57 PM  REQUESTING PROVIDER: Dr.Wieting Subjective:  REASON FOR CONSULT: septic knee ?  ? Kaitlyn Huff is a 82 y.o. female with a history of afib, HLD, rheumatoid arthritis on remicade, presented to the ED with pain and swelling of Rt knee. She did not have any fever or chills Her other joints were hurting as well- especially the feet and hands In the ED vitals were  11/29/22  BP 137/86  Temp 100.3 F (37.9 C)  Pulse Rate 100  Resp 20  SpO2 95 %    Latest Reference Range & Units 11/29/22  WBC 4.0 - 10.5 K/uL 13.9 (H)  Hemoglobin 12.0 - 15.0 g/dL 52.8  HCT 41.3 - 24.4 % 41.2  Platelets 150 - 400 K/uL 243  Creatinine 0.44 - 1.00 mg/dL 0.10 (H)   She had aspiration of the rt kne eon 5/21 and it was -  Latest Reference Range & Units 11/29/22 17:36  Color, Synovial YELLOW  YELLOW !  Appearance-Synovial CLEAR  CLOUDY !  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  Patient had cloudy synovial fluid.  There was calcification seen in the soft tissues of the right knee.  Patient had a degenerative medial meniscus tear.  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   Past Medical History:  Diagnosis Date   Anemia    Cancer (HCC)    Diverticulitis    GERD (gastroesophageal reflux disease)    Hyperlipidemia    Hypertension    Osteoarthritis    Osteoporosis    actonel   Positive PPD    s/p INH (2006)   Rheumatoid arthritis(714.0)    MTX transaminitis, Leflunomide (rash), enbrel, plaquinil, prednisone, remicade, Imuran (transaminitis)   Valvular heart disease    moderate MR and TR    Past Surgical History:  Procedure Laterality Date   ABDOMINAL HYSTERECTOMY     CERVICAL CONE BIOPSY     CHOLECYSTECTOMY  06/22/14   COLONOSCOPY WITH PROPOFOL N/A 07/09/2015   Procedure: COLONOSCOPY WITH PROPOFOL;  Surgeon: Scot Jun, MD;  Location: [facility] ENDOSCOPY;  Service: Endoscopy;  Laterality: N/A;   ESOPHAGOGASTRODUODENOSCOPY (EGD) WITH PROPOFOL N/A 07/09/2015   Procedure: ESOPHAGOGASTRODUODENOSCOPY (EGD) WITH PROPOFOL;  Surgeon: Scot Jun, MD;  Location: [facility] ENDOSCOPY;  Service: Endoscopy;  Laterality: N/A;   IRRIGATION AND DEBRIDEMENT KNEE Right 11/30/2022   Procedure: RIGHT KNEE ARTHROSCOPIC WASHOUT;  Surgeon: Juanell Fairly, MD;  Location: [facility] ORS;  Service: Orthopedics;  Laterality: Right;   OVARY SURGERY     TRACHEOSTOMY  1959   TUBAL LIGATION      Social History   Socioeconomic  History   Marital status: Widowed    Spouse name: Not on file   Number of children: 4   Years of education: Not on file   Highest education level: Not on file  Occupational History   Occupation: retired  Tobacco Use   Smoking status: Never   Smokeless tobacco: Never  Substance and Sexual Activity   Alcohol use: No    Alcohol/week: 0.0 standard drinks of alcohol   Drug use: No   Sexual activity: Never  Other Topics Concern   Not on file  Social History Narrative   Lives with family at home   Social Determinants of Health   Financial Resource Strain: Low Risk  (09/02/2022)   Overall Financial Resource Strain (CARDIA)    Difficulty  of Paying Living Expenses: Not hard at all  Food Insecurity: No Food Insecurity (11/30/2022)   Hunger Vital Sign    Worried About Running Out of Food in the Last Year: Never true    Ran Out of Food in the Last Year: Never true  Transportation Needs: No Transportation Needs (11/30/2022)   PRAPARE - Administrator, Civil Service (Medical): No    Lack of Transportation (Non-Medical): No  Physical Activity: Unknown (09/02/2022)   Exercise Vital Sign    Days of Exercise per Week: 0 days    Minutes of Exercise per Session: Not on file  Stress: No Stress Concern Present (09/02/2022)   [employer] of Occupational Health - Occupational Stress Questionnaire    Feeling of Stress : Not at all  Social Connections: Unknown (09/02/2022)   Social Connection and Isolation Panel [NHANES]    Frequency of Communication with Friends and Family: More than three times a week    Frequency of Social Gatherings with Friends and Family: More than three times a week    Attends Religious Services: Not on file    Active Member of Clubs or Organizations: Yes    Attends Banker Meetings: Not on file    Marital Status: Not on file  Intimate Partner Violence: Not At Risk (11/30/2022)   Humiliation, Afraid, Rape, and Kick questionnaire    Fear of Current or Ex-Partner: No    Emotionally Abused: No    Physically Abused: No    Sexually Abused: No    Family History  Problem Relation Age of Onset   Heart disease Father        MI   Heart disease Mother    Valvular heart disease Mother    Breast cancer Sister 57   Cancer Sister        Lung cancer   COPD Sister    Heart disease Sister    Colon cancer Neg Hx    Allergies  Allergen Reactions   Astelin [Azelastine Hcl] Other (See Comments)    Reaction:  Unknown    Codeine Other (See Comments)    Reaction:  Altered mental status   Dilaudid [Hydromorphone] Other (See Comments)    Reaction:  Unknown    Flexeril [Cyclobenzaprine] Other  (See Comments)    Reaction:  Unknown    Imuran [Azathioprine] Other (See Comments)    Reaction:  Abnormal liver function   Lisinopril Itching   Lyrica [Pregabalin] Other (See Comments)    Reaction:  Sore gums    Methotrexate Derivatives Other (See Comments)    Reaction:  Abnormal liver function   Amiodarone Anxiety and Other (See Comments)    Pt could not eat or sleep. Caused n/v  and "messed her up"   Amoxicillin Rash and Other (See Comments)    Patient has tolerated multiple cepalosporins Unable to obtain enough information to answer additional questions about this medication.     Arava [Leflunomide] Rash   Clindamycin/Lincomycin Rash   Doxycycline Rash   Lodine [Etodolac] Rash   Percocet [Oxycodone-Acetaminophen] Rash   Sulfa Antibiotics Rash   I? Current Facility-Administered Medications  Medication Dose Route Frequency Provider Last Rate Last Admin   ALPRAZolam Prudy Feeler) tablet 0.25 mg  0.25 mg Oral BID PRN Juanell Fairly, MD       alum & mag hydroxide-simeth (MAALOX/MYLANTA) 200-200-20 MG/5ML suspension 30 mL  30 mL Oral Q6H PRN Alford Highland, MD   30 mL at 12/02/22 0240   aspirin EC tablet 81 mg  81 mg Oral Daily Alford Highland, MD   81 mg at 12/02/22 1102   atorvastatin (LIPITOR) tablet 20 mg  20 mg Oral Daily Juanell Fairly, MD   20 mg at 12/02/22 1102   [START ON 12/03/2022] ceFAZolin (ANCEF) IVPB 1 g/50 mL premix  1 g Intravenous Q8H Sami Roes, Rhodia Albright, MD       colestipol (COLESTID) tablet 2 g  2 g Oral Daily Juanell Fairly, MD   2 g at 11/30/22 0859   diltiazem (CARDIZEM CD) 24 hr capsule 180 mg  180 mg Oral Daily Juanell Fairly, MD   180 mg at 12/02/22 1109   fluticasone (FLONASE) 50 MCG/ACT nasal spray 1 spray  1 spray Each Nare Daily Juanell Fairly, MD   1 spray at 12/02/22 1104   [START ON 12/03/2022] furosemide (LASIX) tablet 20 mg  20 mg Oral Daily Tang, Cheryln Manly, PA-C       gabapentin (NEURONTIN) capsule 100 mg  100 mg Oral Daily Juanell Fairly, MD   100 mg at 12/02/22 1101   heparin injection 5,000 Units  5,000 Units Subcutaneous Q8H Juanell Fairly, MD   5,000 Units at 12/02/22 1459   HYDROcodone-acetaminophen (NORCO/VICODIN) 5-325 MG per tablet 1 tablet  1 tablet Oral Q4H PRN Juanell Fairly, MD   1 tablet at 12/02/22 1504   levalbuterol (XOPENEX) nebulizer solution 0.63 mg  0.63 mg Nebulization Q6H PRN Juanell Fairly, MD       loratadine (CLARITIN) tablet 10 mg  10 mg Oral Daily Juanell Fairly, MD   10 mg at 12/02/22 1102   methylPREDNISolone sodium succinate (SOLU-MEDROL) 40 mg/mL injection 40 mg  40 mg Intravenous Daily Alford Highland, MD   40 mg at 12/02/22 1107   multivitamin with minerals tablet 1 tablet  1 tablet Oral Daily Juanell Fairly, MD   1 tablet at 12/02/22 1101   nitroGLYCERIN (NITROSTAT) SL tablet 0.4 mg  0.4 mg Sublingual Q5 min PRN Alford Highland, MD   0.4 mg at 12/01/22 1811   ondansetron (ZOFRAN) tablet 4 mg  4 mg Oral Q6H PRN Juanell Fairly, MD       Or   ondansetron [facility]) injection 4 mg  4 mg Intravenous Q6H PRN Juanell Fairly, MD       pantoprazole (PROTONIX) EC tablet 40 mg  40 mg Oral Daily Juanell Fairly, MD   40 mg at 12/02/22 1102   spironolactone (ALDACTONE) tablet 12.5 mg  12.5 mg Oral Daily Juanell Fairly, MD   12.5 mg at 12/02/22 1101   traMADol (ULTRAM) tablet 50 mg  50 mg Oral Q6H PRN Juanell Fairly, MD   50 mg at 12/01/22 0542     Abtx:  Anti-infectives (From admission, onward)  Start     Dose/Rate Route Frequency Ordered Stop   12/03/22 0600  ceFAZolin (ANCEF) IVPB 1 g/50 mL premix        1 g 100 mL/hr over 30 Minutes Intravenous Every 8 hours 12/02/22 1221     12/01/22 1400  vancomycin (VANCOREADY) IVPB 750 mg/150 mL  Status:  Discontinued        750 mg 150 mL/hr over 60 Minutes Intravenous Every 24 hours 11/30/22 1520 12/02/22 1221   11/30/22 2300  vancomycin (VANCOREADY) IVPB 750 mg/150 mL  Status:  Discontinued        750 mg 150 mL/hr over 60 Minutes  Intravenous Every 24 hours 11/29/22 2201 11/30/22 1520   11/30/22 1700  vancomycin (VANCOREADY) IVPB 1250 mg/250 mL        1,250 mg 166.7 mL/hr over 90 Minutes Intravenous  Once 11/30/22 1520 11/30/22 1946   11/30/22 1200  cefTRIAXone (ROCEPHIN) 2 g in sodium chloride 0.9 % 100 mL IVPB  Status:  Discontinued        2 g 200 mL/hr over 30 Minutes Intravenous Every 24 hours 11/30/22 0926 12/02/22 1221   11/30/22 0600  aztreonam (AZACTAM) 1 g in sodium chloride 0.9 % 100 mL IVPB  Status:  Discontinued        1 g 200 mL/hr over 30 Minutes Intravenous Every 8 hours 11/30/22 0234 11/30/22 0926   11/29/22 2200  vancomycin (VANCOREADY) IVPB 1250 mg/250 mL  Status:  Discontinued        1,250 mg 166.7 mL/hr over 90 Minutes Intravenous  Once 11/29/22 2158 11/30/22 1520       REVIEW OF SYSTEMS:  Const: negative fever, negative chills, negative weight loss Eyes: negative diplopia or visual changes, negative eye pain ENT: negative coryza, negative sore throat Resp: negative cough, hemoptysis, dyspnea Cards: negative for chest pain, palpitations, lower extremity edema GU: negative for frequency, dysuria and hematuria GI: Negative for abdominal pain, diarrhea, bleeding, constipation Skin: negative for rash and pruritus Heme: negative for easy bruising and gum/nose bleeding MS: arthralgia multiple joints Swelling rt knee Neurolo:negative for headaches, dizziness, vertigo, memory problems  Psych: negative for feelings of anxiety, depression  Endocrine: negative for thyroid, diabetes Allergy/Immunology-multiple listed Objective:  VITALS:  BP 131/67 (BP Location: Right Arm)   Pulse 76   Temp 98 F (36.7 C)   Resp 16   Ht 5\' 5"  (1.651 m)   Wt 56.6 kg   SpO2 94%   BMI 20.76 kg/m   PHYSICAL EXAM:  General: Alert, cooperative, no distress, pale, thin Head: Normocephalic, without obvious abnormality, atraumatic. Eyes: Conjunctivae clear, anicteric sclerae. Pupils are equal ENT Nares normal.  No drainage or sinus tenderness. Lips, mucosa, and tongue normal. No Thrush Neck: Supple, symmetrical, no adenopathy, thyroid: non tender no carotid bruit and no JVD. Back: No CVA tenderness. Lungs: Clear to auscultation bilaterally. No Wheezing or Rhonchi. No rales. Heart: irregular- 2/6 systolic murmur Abdomen: Soft, non-tender,not distended. Bowel sounds normal. No masses Extremities: rt knee dressing not removed Skin: No rashes or lesions. Or bruising Lymph: Cervical, supraclavicular normal. Neurologic: Grossly non-focal Pertinent Labs Lab Results CBC    Component Value Date/Time   WBC 12.5 (H) 12/02/2022 0753   RBC 3.68 (L) 12/02/2022 0753   HGB 11.7 (L) 12/02/2022 0753   HGB 9.2 (L) 11/03/2014 0441   HCT 33.5 (L) 12/02/2022 0753   HCT 27.7 (L) 11/03/2014 0441   PLT 245 12/02/2022 0753   PLT 142 (L) 11/03/2014 0441   MCV 91.0  12/02/2022 0753   MCV 94 11/03/2014 0441   MCH 31.8 12/02/2022 0753   MCHC 34.9 12/02/2022 0753   RDW 12.4 12/02/2022 0753   RDW 14.0 11/03/2014 0441   LYMPHSABS 1.8 11/30/2022 0547   LYMPHSABS 0.9 (L) 11/03/2014 0441   MONOABS 1.7 (H) 11/30/2022 0547   MONOABS 0.3 11/03/2014 0441   EOSABS 0.0 11/30/2022 0547   EOSABS 0.0 11/03/2014 0441   BASOSABS 0.0 11/30/2022 0547   BASOSABS 0.0 11/03/2014 0441   BASOSABS 0 12/04/2012 1330       Latest Ref Rng & Units 12/02/2022    5:42 AM 12/01/2022    4:55 AM 11/30/2022    5:47 AM  CMP  Glucose 70 - 99 mg/dL 811     BUN 8 - 23 mg/dL 27     Creatinine 9.14 - 1.00 mg/dL 7.82  9.56  2.13   Sodium 135 - 145 mmol/L 125     Potassium 3.5 - 5.1 mmol/L 4.1     Chloride 98 - 111 mmol/L 94     CO2 22 - 32 mmol/L 23     Calcium 8.9 - 10.3 mg/dL 8.2         Microbiology: Recent Results (from the past 240 hour(s))  Body fluid culture w Gram Stain     Status: None (Preliminary result)   Collection Time: 11/29/22  5:36 PM   Specimen: Synovium; Body Fluid  Result Value Ref Range Status   Specimen  Description   Final    SYNOVIAL KNEE JOINT Performed at [facility], [address] [place], [place] [address]    Special Requests   Final    SYNOVIAL Performed at [facility], [address] [place], [place] [address]    Gram Stain   Final    MODERATE WBC PRESENT,BOTH PMN AND MONONUCLEAR NO ORGANISMS SEEN    Culture   Final    NO GROWTH 3 DAYS Performed at [facility], [address], [place], [place] [address]    Report Status PENDING  Incomplete  Culture, blood (routine x 2)     Status: None (Preliminary result)   Collection Time: 11/29/22 10:11 PM   Specimen: BLOOD  Result Value Ref Range Status   Specimen Description BLOOD  LEFT AC  Final   Special Requests   Final    BOTTLES DRAWN AEROBIC AND ANAEROBIC Blood Culture adequate volume   Culture   Final    NO GROWTH 3 DAYS Performed at [facility], [address] [place], [place] [address]    Report Status PENDING  Incomplete  Culture, blood (routine x 2)     Status: None (Preliminary result)   Collection Time: 11/29/22 10:11 PM   Specimen: BLOOD  Result Value Ref Range Status   Specimen Description BLOOD  RIGHT HAND  Final   Special Requests   Final    BOTTLES DRAWN AEROBIC AND ANAEROBIC Blood Culture adequate volume   Culture   Final    NO GROWTH 3 DAYS Performed at [facility], [address] [place], [place] [address]    Report Status PENDING  Incomplete  Body fluid culture w Gram Stain     Status: None (Preliminary result)   Collection Time: 11/30/22  2:52 PM   Specimen: Synovium  Result Value Ref Range Status   Specimen Description SYNOVIAL  Final   Special Requests RIGHT KNEE  Final   Gram Stain   Final    ABUNDANT WBC  PRESENT, PREDOMINANTLY PMN NO ORGANISMS SEEN    Culture   Final    NO GROWTH 2 DAYS Performed at [facility], [address] [place], [place] [address]    Report Status PENDING  Incomplete  Gram stain     Status: None    Collection Time: 11/30/22  3:13 PM   Specimen: Path fluid; Body Fluid  Result Value Ref Range Status   Specimen Description SYNOVIAL  Final   Special Requests RIGHT KNEE  Final   Gram Stain   Final    FEW WBC SEEN RARE RBC SEEN NO ORGANISMS SEEN CALLED TO DR. KRAZINSKI 11/30/22 @ 1539 BY SB Performed at [facility], [address] [place], [place] [address]    Report Status 12/01/2022 FINAL  Final    IMAGING RESULTS:  I have personally reviewed the films ? 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   Rheumatoid arthritis on Remicade  CPPD   Anemia ? ? ___________________________________________________ Discussed with patient, requesting provider in detail ID will follow her peripherally this weekend [facility] on call Note:  This document was prepared using Dragon voice recognition software and may include unintentional dictation errors.

## 2022-12-02 NOTE — Progress Notes (Signed)
Progress Note   Patient: Kaitlyn Huff ZOX:[id number] DOB: 02/03/1941 DOA: 11/29/2022     3 DOS: the patient was seen and examined on 12/02/2022   Brief hospital course: 82 y.o. female with medical history significant of   HLD, HTN , RA on infusion q 2 months, valvular heart disease stable, GERD,  who presents to ED  with one day  swelling and pain  of right knee. She notes no associated fever/chills / sob/ chest pain / n/v/ or abdominal pain. Patient notes current symptoms are similar to her typical flare but more painful.  Patient denies any fall or injury. Patient states she noted symptoms on awaking this am. She also note she is unable to walk due to pain. She states she had a near fall prior to presenting to ED.  She also notes pain in her left knee but states it is not as severe.   5/22.  Right knee aspiration does not show any organisms or crystals.  White blood cell count 38,335 and fluid with 80% neutrophils.  Patient will be brought to the operating room today by Dr. Martha Clan. Post-operatively went into afib. 5/23.  POD 1. Left knee pain.  Still in atrial fibrillation.  Had chest pain in the evening.  Cardiac enzymes flat.  Started on aspirin.  CT scan of the chest showed moderate hiatal hernia and no pulmonary embolism and coronary artery calcification. 5/24.  Patient complaining of left elbow pain.  Case discussed with ID and she was okay with me starting steroids.  Antibiotics switched over to Ancef for tomorrow.    Assessment and Plan: * Acute pain of right knee With joint effusion.  Currently no organisms or crystals seen on aspiration.  81,191 white blood cells on aspiration.  OR cultures did not show any organism also.  ID switched antibiotics over to Ancef.  Paroxysmal atrial fibrillation (HCC) Continue cardizem cd.  Patient postoperatively went into afib but rate controlled.  Reviewed prior cardiology notes and with daughter about no anticoagulation with fall risk.  Higher  risk of stroke without anticoagulation.  Rheumatoid arthritis flare (HCC) Pain bilateral knees.  Right knee effusion.  Also acute pain in left elbow.  Start Solu-Medrol.  Hypertension Continue Cardizem CD and spironolactone  Hyponatremia Sodium 125, will fluid restrict.  Hypercholesterolemia Continue statin  Obstructive sleep apnea On oxygen at night  GERD (gastroesophageal reflux disease) On PPI  Chest pain Resolved.  Follow-up with cardiology as outpatient.  Started on aspirin.  Troponins only borderline but flat.        Subjective: Patient does not remember having chest pain yesterday.  Has some pain in her knee.  The main thing bothering her today is her left elbow with decreased range of motion.  Had nursing staff evaluate that IV in her left antecubital.  Physical Exam: Vitals:   12/01/22 1554 12/01/22 1741 12/01/22 2332 12/02/22 0728  BP: (!) 120/58 (!) 111/43 122/72 131/67  Pulse: (!) 59 62 70 76  Resp: 18  19 16   Temp: 97.9 F (36.6 C)  99.5 F (37.5 C) 98 F (36.7 C)  TempSrc: Oral     SpO2: 93% 94% 94% 94%  Weight:      Height:       Physical Exam HENT:     Head: Normocephalic.     Mouth/Throat:     Pharynx: No oropharyngeal exudate.  Eyes:     General: Lids are normal.     Conjunctiva/sclera: Conjunctivae normal.  Cardiovascular:  Rate and Rhythm: Normal rate. Rhythm irregularly irregular.     Heart sounds: Normal heart sounds, S1 normal and S2 normal.  Pulmonary:     Breath sounds: No decreased breath sounds, wheezing, rhonchi or rales.  Chest:     Comments: No pain over chest wall Abdominal:     Palpations: Abdomen is soft.     Tenderness: There is no abdominal tenderness.  Musculoskeletal:     Left elbow: Decreased range of motion. Tenderness present.     Comments: Right knee covered  Skin:    General: Skin is warm.     Findings: No rash.  Neurological:     Mental Status: She is alert and oriented to person, place, and time.      Data Reviewed: No growth of the joint aspiration 3 days, no growth out of the operative culture for 2 days  Family Communication: Spoke with son at bedside  Disposition: Status is: Inpatient Remains inpatient appropriate because: Will start Solu-Medrol for rheumatoid arthritis flare left elbow and this may also help out her knee.  Planned Discharge Destination: Home with Home Health    Time spent: 28 minutes Case discussed with infectious disease specialist and cardiology.  Author: Alford Highland, MD 12/02/2022 12:59 PM  For on call review [email or web address].

## 2022-12-03 DIAGNOSIS — M069 Rheumatoid arthritis, unspecified: Secondary | ICD-10-CM | POA: Diagnosis not present

## 2022-12-03 DIAGNOSIS — I480 Paroxysmal atrial fibrillation: Secondary | ICD-10-CM | POA: Diagnosis not present

## 2022-12-03 DIAGNOSIS — M25561 Pain in right knee: Secondary | ICD-10-CM | POA: Diagnosis not present

## 2022-12-03 DIAGNOSIS — R079 Chest pain, unspecified: Secondary | ICD-10-CM

## 2022-12-03 DIAGNOSIS — E871 Hypo-osmolality and hyponatremia: Secondary | ICD-10-CM | POA: Diagnosis not present

## 2022-12-03 LAB — BASIC METABOLIC PANEL
Anion gap: 9 (ref 5–15)
BUN: 35 mg/dL — ABNORMAL HIGH (ref 8–23)
CO2: 23 mmol/L (ref 22–32)
Calcium: 8.4 mg/dL — ABNORMAL LOW (ref 8.9–10.3)
Chloride: 91 mmol/L — ABNORMAL LOW (ref 98–111)
Creatinine, Ser: 0.95 mg/dL (ref 0.44–1.00)
GFR, Estimated: >60 mL/min (ref >60)
Glucose, Bld: 148 mg/dL — ABNORMAL HIGH (ref 70–99)
Potassium: 4.4 mmol/L (ref 3.5–5.1)
Sodium: 123 mmol/L — ABNORMAL LOW (ref 135–145)

## 2022-12-03 LAB — CBC
HCT: 28.3 % — ABNORMAL LOW (ref 36.0–46.0)
Hemoglobin: 10 g/dL — ABNORMAL LOW (ref 12.0–15.0)
MCH: 31.8 pg (ref 26.0–34.0)
MCHC: 35.3 g/dL (ref 30.0–36.0)
MCV: 90.1 fL (ref 80.0–100.0)
Platelets: 251 10*3/uL (ref 150–400)
RBC: 3.14 MIL/uL — ABNORMAL LOW (ref 3.87–5.11)
RDW: 12.4 % (ref 11.5–15.5)
WBC: 15.4 10*3/uL — ABNORMAL HIGH (ref 4.0–10.5)
nRBC: 0 % (ref 0.0–0.2)

## 2022-12-03 LAB — BODY FLUID CULTURE W GRAM STAIN: Culture: NO GROWTH

## 2022-12-03 LAB — CULTURE, BLOOD (ROUTINE X 2)

## 2022-12-03 MED ORDER — SODIUM CHLORIDE 1 G PO TABS
1.0000 g | ORAL_TABLET | Freq: Two times a day (BID) | ORAL | Status: DC
  Administered 2022-12-03 (×2): 1 g via ORAL
  Filled 2022-12-03 (×2): qty 1

## 2022-12-03 MED ORDER — ENSURE ENLIVE PO LIQD
237.0000 mL | Freq: Two times a day (BID) | ORAL | Status: DC
  Administered 2022-12-03 – 2022-12-04 (×2): 237 mL via ORAL

## 2022-12-03 NOTE — Progress Notes (Signed)
Progress Note   Patient: Kaitlyn Huff ZOX:[id number] DOB: August 20, 1940 DOA: 11/29/2022     4 DOS: the patient was seen and examined on 12/03/2022   Brief hospital course: 82 y.o. female with medical history significant of   HLD, HTN , RA on infusion q 2 months, valvular heart disease stable, GERD,  who presents to ED  with one day  swelling and pain  of right knee. She notes no associated fever/chills / sob/ chest pain / n/v/ or abdominal pain. Patient notes current symptoms are similar to her typical flare but more painful.  Patient denies any fall or injury. Patient states she noted symptoms on awaking this am. She also note she is unable to walk due to pain. She states she had a near fall prior to presenting to ED.  She also notes pain in her left knee but states it is not as severe.   5/22.  Right knee aspiration does not show any organisms or crystals.  White blood cell count 38,335 and fluid with 80% neutrophils.  Patient will be brought to the operating room today by Dr. Martha Clan. Post-operatively went into afib. 5/23.  POD 1. Left knee pain.  Still in atrial fibrillation.  Had chest pain in the evening.  Cardiac enzymes flat.  Started on aspirin.  CT scan of the chest showed moderate hiatal hernia and no pulmonary embolism and coronary artery calcification. 5/24.  Patient complaining of left elbow pain.  Case discussed with ID and she was okay with me starting steroids.  Antibiotics switched over to Ancef for tomorrow.    Assessment and Plan: * Hyponatremia Sodium dropped down to 123 despite fluid restriction.  Will add salt tablets.  Likely secondary to not eating very well.  Patient advised to eat.  Acute pain of right knee With joint effusion.  Currently no organisms or crystals seen on aspiration.  81,191 white blood cells on aspiration.  OR cultures did not show any organism also.  ID switched antibiotics over to Ancef and will switch over to Keflex upon discharge.  Paroxysmal  atrial fibrillation (HCC) Continue cardizem cd.  Patient postoperatively went into afib but rate controlled.  Reviewed prior cardiology notes and with daughter about no anticoagulation with fall risk.  Higher risk of stroke without anticoagulation.  Rheumatoid arthritis flare (HCC) Pain bilateral knees.  Right knee effusion.  Also acute pain in left elbow.  Continue Solu-Medrol today and switch over to prednisone for tomorrow.  Left elbow pain much better today.  Hypertension Continue Cardizem CD and spironolactone  Hypercholesterolemia Continue statin  Obstructive sleep apnea On oxygen at night  GERD (gastroesophageal reflux disease) On PPI  Chest pain Resolved.  Follow-up with cardiology as outpatient.  Started on aspirin.  Troponins only borderline but flat.        Subjective: Patient feels okay.  Not eating very well.  Sodium dropped down to 123.  Placed on salt tablets today.  Advise she must eat.  Initially admitted with right knee effusion and pain and difficulty walking.  Walk better with physical therapy today.  Left elbow improved.  Physical Exam: Vitals:   12/02/22 0728 12/02/22 1642 12/02/22 2309 12/03/22 0834  BP: 131/67 130/73 133/62 131/72  Pulse: 76 64 68 70  Resp: 16 15 20 16   Temp: 98 F (36.7 C) 98 F (36.7 C) 98.2 F (36.8 C) 98.8 F (37.1 C)  TempSrc:   Oral   SpO2: 94% 92% 97% 96%  Weight:  Height:       Physical Exam HENT:     Head: Normocephalic.     Mouth/Throat:     Pharynx: No oropharyngeal exudate.  Eyes:     General: Lids are normal.     Conjunctiva/sclera: Conjunctivae normal.  Cardiovascular:     Rate and Rhythm: Normal rate. Rhythm irregularly irregular.     Heart sounds: Normal heart sounds, S1 normal and S2 normal.  Pulmonary:     Breath sounds: No decreased breath sounds, wheezing, rhonchi or rales.  Chest:     Comments: No pain over chest wall Abdominal:     Palpations: Abdomen is soft.     Tenderness: There is no  abdominal tenderness.  Musculoskeletal:     Left elbow: Normal range of motion. No tenderness.     Comments: Right knee covered  Skin:    General: Skin is warm.     Findings: No rash.  Neurological:     Mental Status: She is alert and oriented to person, place, and time.     Data Reviewed: Sodium dropped down to 123.  Creatinine 0.95, white blood cell count 15.4, hemoglobin 10.0  Family Communication: Updated patient's daughter on the phone  Disposition: Status is: Inpatient Remains inpatient appropriate because: With sodium dropping down to 123 will add salt tablets and recheck sodium tomorrow.  Planned Discharge Destination: Home with Home Health    Time spent: 28 minutes  Author: Alford Highland, MD 12/03/2022 12:23 PM  For on call review [email or web address].

## 2022-12-03 NOTE — Progress Notes (Signed)
[facility]    SUBJECTIVE: Patient feeling much better no chest pain no shortness of breath feels ready to be discharged home   Vitals:   12/02/22 0728 12/02/22 1642 12/02/22 2309 12/03/22 0834  BP: 131/67 130/73 133/62 131/72  Pulse: 76 64 68 70  Resp: 16 15 20 16   Temp: 98 F (36.7 C) 98 F (36.7 C) 98.2 F (36.8 C) 98.8 F (37.1 C)  TempSrc:   Oral   SpO2: 94% 92% 97% 96%  Weight:      Height:        No intake or output data in the 24 hours ending 12/03/22 0955    PHYSICAL EXAM  General: Well developed, well nourished, in no acute distress HEENT:  Normocephalic and atramatic Neck:  No JVD.  Lungs: Clear bilaterally to auscultation and percussion. Heart: Irregular irregular. Normal S1 and S2 without gallops or murmurs.  Abdomen: Bowel sounds are positive, abdomen soft and non-tender  Msk:  Back normal, normal gait. Normal strength and tone for age. Extremities: No clubbing, cyanosis or edema.   Neuro: Alert and oriented X 3. Psych:  Good affect, responds appropriately   LABS: Basic Metabolic Panel: Recent Labs    12/02/22 0542 12/03/22 0737  NA 125* 123*  K 4.1 4.4  CL 94* 91*  CO2 23 23  GLUCOSE 129* 148*  BUN 27* 35*  CREATININE 0.91 0.95  CALCIUM 8.2* 8.4*   Liver Function Tests: No results for input(s): "AST", "ALT", "ALKPHOS", "BILITOT", "PROT", "ALBUMIN" in the last 72 hours. No results for input(s): "LIPASE", "AMYLASE" in the last 72 hours. CBC: Recent Labs    12/02/22 0753 12/03/22 0739  WBC 12.5* 15.4*  HGB 11.7* 10.0*  HCT 33.5* 28.3*  MCV 91.0 90.1  PLT 245 251   Cardiac Enzymes: No results for input(s): "CKTOTAL", "CKMB", "CKMBINDEX", "TROPONINI" in the last 72 hours. BNP: Invalid input(s): "POCBNP" D-Dimer: No results for input(s): "DDIMER" in the last 72 hours. Hemoglobin A1C: No results for input(s): "HGBA1C" in the last 72 hours. Fasting Lipid Panel: Recent Labs    12/02/22 0542  CHOL 132  HDL 65  LDLCALC 49   TRIG 91  CHOLHDL 2.0   Thyroid Function Tests: No results for input(s): "TSH", "T4TOTAL", "T3FREE", "THYROIDAB" in the last 72 hours.  Invalid input(s): "FREET3" Anemia Panel: No results for input(s): "VITAMINB12", "FOLATE", "FERRITIN", "TIBC", "IRON", "RETICCTPCT" in the last 72 hours.  ECHOCARDIOGRAM COMPLETE  Result Date: 12/02/2022    ECHOCARDIOGRAM REPORT   Patient Name:   SARENNA CORFIELD [facility] Date of Exam: 12/01/2022 Medical Rec #:  [id number]      Height:       65.0 in Accession #:    [id number]     Weight:       124.8 lb Date of Birth:  05/30/1941     BSA:          1.619 m Patient Age:    20 years       BP:           125/61 mmHg Patient Gender: F              HR:           65 bpm. Exam Location:  [facility] Procedure: 2D Echo, Cardiac Doppler and Color Doppler Indications:     Atrial Flutter  History:         Patient has prior history of Echocardiogram examinations, most  recent 11/23/2020. Arrythmias:Atrial Fibrillation, Atrial                  Flutter and Tachycardia,                  Signs/Symptoms:Dizziness/Lightheadedness, Fatigue, Edema and                  Dyspnea; Risk Factors:Hypertension and Sleep Apnea.  Sonographer:     Mikki Harbor Referring Phys:  [id number] Alford Highland Diagnosing Phys: Alwyn Pea MD IMPRESSIONS  1. Left ventricular ejection fraction, by estimation, is 60 to 65%. The left ventricle has normal function. The left ventricle has no regional wall motion abnormalities. There is mild concentric left ventricular hypertrophy. Left ventricular diastolic parameters are consistent with Grade I diastolic dysfunction (impaired relaxation).  2. Right ventricular systolic function is normal. The right ventricular size is normal. There is mildly elevated pulmonary artery systolic pressure.  3. Left atrial size was mildly dilated.  4. Right atrial size was mildly dilated.  5. The mitral valve is normal in structure. Mild mitral valve regurgitation.  6. Tricuspid valve  regurgitation is moderate.  7. The aortic valve is grossly normal. Aortic valve regurgitation is mild to moderate. Aortic valve sclerosis is present, with no evidence of aortic valve stenosis. FINDINGS  Left Ventricle: Left ventricular ejection fraction, by estimation, is 60 to 65%. The left ventricle has normal function. The left ventricle has no regional wall motion abnormalities. The left ventricular internal cavity size was normal in size. There is  mild concentric left ventricular hypertrophy. Left ventricular diastolic parameters are consistent with Grade I diastolic dysfunction (impaired relaxation). Right Ventricle: The right ventricular size is normal. No increase in right ventricular wall thickness. Right ventricular systolic function is normal. There is mildly elevated pulmonary artery systolic pressure. The tricuspid regurgitant velocity is 2.87  m/s, and with an assumed right atrial pressure of 8 mmHg, the estimated right ventricular systolic pressure is 40.9 mmHg. Left Atrium: Left atrial size was mildly dilated. Right Atrium: Right atrial size was mildly dilated. Pericardium: There is no evidence of pericardial effusion. Mitral Valve: The mitral valve is normal in structure. Mild mitral valve regurgitation. MV peak gradient, 3.2 mmHg. The mean mitral valve gradient is 1.0 mmHg. Tricuspid Valve: The tricuspid valve is grossly normal. Tricuspid valve regurgitation is moderate. Aortic Valve: The aortic valve is grossly normal. Aortic valve regurgitation is mild to moderate. Aortic valve sclerosis is present, with no evidence of aortic valve stenosis. Aortic valve mean gradient measures 5.0 mmHg. Aortic valve peak gradient measures 11.9 mmHg. Aortic valve area, by VTI measures 1.95 cm. Pulmonic Valve: The pulmonic valve was grossly normal. Pulmonic valve regurgitation is mild. Aorta: The ascending aorta was not well visualized. IAS/Shunts: No atrial level shunt detected by color flow Doppler.  LEFT  VENTRICLE PLAX 2D LVIDd:         4.70 cm LVIDs:         3.00 cm LV PW:         1.20 cm LV IVS:        1.20 cm LVOT diam:     1.90 cm LV SV:         58 LV SV Index:   36 LVOT Area:     2.84 cm  RIGHT VENTRICLE RV Basal diam:  3.90 cm RV Mid diam:    2.70 cm RV S prime:     11.20 cm/s LEFT ATRIUM  Index        RIGHT ATRIUM           Index LA diam:        4.60 cm  2.84 cm/m   RA Area:     27.80 cm LA Vol (A2C):   108.0 ml 66.72 ml/m  RA Volume:   83.30 ml  51.46 ml/m LA Vol (A4C):   58.9 ml  36.39 ml/m LA Biplane Vol: 80.0 ml  49.42 ml/m  AORTIC VALVE                    PULMONIC VALVE AV Area (Vmax):    1.82 cm     PV Vmax:       1.37 m/s AV Area (Vmean):   1.86 cm     PV Peak grad:  7.5 mmHg AV Area (VTI):     1.95 cm AV Vmax:           172.50 cm/s AV Vmean:          96.800 cm/s AV VTI:            0.294 m AV Peak Grad:      11.9 mmHg AV Mean Grad:      5.0 mmHg LVOT Vmax:         111.00 cm/s LVOT Vmean:        63.400 cm/s LVOT VTI:          0.203 m LVOT/AV VTI ratio: 0.69  AORTA Ao Root diam: 3.40 cm Ao Asc diam:  3.70 cm MITRAL VALVE               TRICUSPID VALVE MV Area (PHT): 3.53 cm    TR Peak grad:   32.9 mmHg MV Area VTI:   2.90 cm    TR Vmax:        287.00 cm/s MV Peak grad:  3.2 mmHg MV Mean grad:  1.0 mmHg    SHUNTS MV Vmax:       0.89 m/s    Systemic VTI:  0.20 m MV Vmean:      53.6 cm/s   Systemic Diam: 1.90 cm MV Decel Time: 215 msec MV E velocity: 71.00 cm/s Alwyn Pea MD Electronically signed by Alwyn Pea MD Signature Date/Time: 12/02/2022/8:00:44 AM    Final    CT Angio Chest Pulmonary Embolism (PE) W or WO Contrast  Result Date: 12/01/2022 CLINICAL DATA:  PE suspected, negative D-dimer EXAM: CT ANGIOGRAPHY CHEST WITH CONTRAST TECHNIQUE: Multidetector CT imaging of the chest was performed using the standard protocol during bolus administration of intravenous contrast. Multiplanar CT image reconstructions and MIPs were obtained to evaluate the vascular anatomy.  RADIATION DOSE REDUCTION: This exam was performed according to the departmental dose-optimization program which includes automated exposure control, adjustment of the mA and/or kV according to patient size and/or use of iterative reconstruction technique. CONTRAST:  75mL OMNIPAQUE IOHEXOL 350 MG/ML SOLN COMPARISON:  None Available. FINDINGS: Cardiovascular: Satisfactory opacification of the pulmonary arteries to the segmental level. No evidence of pulmonary embolism. Cardiomegaly. Three-vessel coronary artery calcifications. No pericardial effusion. Mediastinum/Nodes: No enlarged mediastinal, hilar, or axillary lymph nodes. Moderate hiatal hernia with intrathoracic position of the gastric fundus. Thyroid gland, trachea, and esophagus demonstrate no significant findings. Lungs/Pleura: Small bilateral pleural effusions and associated atelectasis or consolidation Upper Abdomen: No acute abnormality.  Status post cholecystectomy. Musculoskeletal: No chest wall abnormality. No acute osseous findings. Review of the MIP images confirms the above findings.  IMPRESSION: 1. Negative examination for pulmonary embolism. 2. Small bilateral pleural effusions and associated atelectasis or consolidation. 3. Cardiomegaly and coronary artery disease. 4. Moderate hiatal hernia with intrathoracic position of the gastric fundus. Electronically Signed   By: Jearld Lesch M.D.   On: 12/01/2022 19:12   DG Cervical Spine 2 or 3 views  Result Date: 12/01/2022 CLINICAL DATA:  Neck pain EXAM: CERVICAL SPINE - 2-3 VIEW COMPARISON:  CT 06/07/15 FINDINGS: No acute posttraumatic malalignment of the cervical spine. Unchanged stepwise first degree anterolisthesis of C4 on C5 and C5 on C6. disc space narrowing and endplate spurring is identified at C5-6 and C6-7. Bilateral multilevel facet arthropathy identified. No signs of acute fracture or dislocation. Prevertebral soft tissues are unremarkable. Left-sided carotid artery calcifications noted.  IMPRESSION: 1. No acute findings. 2. 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 3. Left-sided carotid artery calcifications. Consider further evaluation with carotid ultrasound. Electronically Signed   By: Signa Kell M.D.   On: 12/01/2022 15:30     Echo normal left ventricular function EF 60%  TELEMETRY: Atrial fibrillation rate of about 60 nonspecific T wave changes:  ASSESSMENT AND PLAN:  Principal Problem:   Acute pain of right knee Active Problems:   Hypercholesterolemia   Rheumatoid arthritis flare (HCC)   GERD (gastroesophageal reflux disease)   Hypertension   Obstructive sleep apnea   Hyponatremia   Paroxysmal atrial fibrillation (HCC)   Chest pain    Plan Atrial fibrillation rate controlled currently at 65 continue current therapy Rheumatoid arthritis continue current management GERD chronic stable continue management Obstructive sleep apnea: Sleep study CPAP weight loss Chest pain improved none currently recommend conservative management Recommend weight loss exercise portion control   Alwyn Pea, MD 12/03/2022 9:55 AM

## 2022-12-03 NOTE — Plan of Care (Signed)

## 2022-12-03 NOTE — Progress Notes (Signed)
Physical Therapy Treatment Patient Details Name: Kaitlyn Huff MRN: [id number] DOB: 09-Jan-1941 Today's Date: 12/03/2022   History of Present Illness 82 y/o female to [facility] on 5/21 c BLE pain. PMH: OA, RA, HTN, HLD. 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 Right knee arthroscopic irrigation and debridement with partial medial meniscectomy and extensive synovectomy c Dr. Martha Clan 5/22.    PT Comments    Pt pleasant and motivated to work with PT and ultimately did quite well.  She was able to do mobility and transfers with relative ease, and using a walker was able to circumambulate the nurses' station with consistent speed and cadence.  Pt with good overall tolerance (HR 70s-90s) w/o the effort and no LOBs or overt safety concerns.  Daughter present t/o the session.    Recommendations for follow up therapy are one component of a multi-disciplinary discharge planning process, led by the attending physician.  Recommendations may be updated based on patient status, additional functional criteria and insurance authorization.  Follow Up Recommendations       Assistance Recommended at Discharge Intermittent Supervision/Assistance  Patient can return home with the following A little help with walking and/or transfers;A little help with bathing/dressing/bathroom;Assistance with cooking/housework;Help with stairs or ramp for entrance   Equipment Recommendations       Recommendations for Other Services       Precautions / Restrictions Precautions Precautions: Fall Restrictions Weight Bearing Restrictions: No     Mobility  Bed Mobility Overal bed mobility: Needs Assistance Bed Mobility: Supine to Sit     Supine to sit: Supervision     General bed mobility comments: Pt was able to get to sitting EOB with relative ease    Transfers Overall transfer level: Needs assistance Equipment used: Rolling walker (2 wheels) Transfers: Sit to/from Stand Sit to Stand: Supervision           General  transfer comment: Pt was able to rise to standing w/o assist, minimal cuing for set up    Ambulation/Gait Ambulation/Gait assistance: Min guard Gait Distance (Feet): 250 Feet Assistive device: Rolling walker (2 wheels)         General Gait Details: Pt was able to maintain consistent cadence with appropriate walker use.  Pt reports some fatigue by the end of the effort but ultimately showed great safety and confidence with prolonged bout of ambulation.  Supervision only with no overt safety concerns.  HR remained in the 70-90 range.   Stairs             Wheelchair Mobility    Modified Rankin (Stroke Patients Only)       Balance Overall balance assessment: Needs assistance Sitting-balance support: No upper extremity supported, Feet supported Sitting balance-Leahy Scale: Good     Standing balance support: Bilateral upper extremity supported Standing balance-Leahy Scale: Good                              Cognition Arousal/Alertness: Awake/alert Behavior During Therapy: WFL for tasks assessed/performed Overall Cognitive Status: Within Functional Limits for tasks assessed                                          Exercises General Exercises - Lower Extremity Quad Sets: Strengthening, 5 reps Heel Slides: Strengthening, 5 reps Hip ABduction/ADduction: Strengthening, 5 reps Straight Leg Raises: AROM, 5 reps  General Comments        Pertinent Vitals/Pain Pain Assessment Pain Assessment: 0-10 Pain Score: 2     Home Living                          Prior Function            PT Goals (current goals can now be found in the care plan section) Acute Rehab PT Goals PT Goal Formulation: With patient Time For Goal Achievement: 12/16/22 Potential to Achieve Goals: Good Progress towards PT goals: Progressing toward goals    Frequency    Min 4X/week      PT Plan Current plan remains appropriate    Co-evaluation               AM-PAC PT "6 Clicks" Mobility   Outcome Measure  Help needed turning from your back to your side while in a flat bed without using bedrails?: None Help needed moving from lying on your back to sitting on the side of a flat bed without using bedrails?: None Help needed moving to and from a bed to a chair (including a wheelchair)?: None Help needed standing up from a chair using your arms (e.g., wheelchair or bedside chair)?: None Help needed to walk in hospital room?: A Little Help needed climbing 3-5 steps with a railing? : A Little 6 Click Score: 22    End of Session Equipment Utilized During Treatment: Gait belt Activity Tolerance: Patient tolerated treatment well;Treatment limited secondary to medical complications (Comment);Patient limited by pain Patient left: with call bell/phone within reach;with chair alarm set;with family/visitor present   PT Visit Diagnosis: Difficulty in walking, not elsewhere classified (R26.2);Muscle weakness (generalized) (M62.81);Other abnormalities of gait and mobility (R26.89);Other symptoms and signs involving the nervous system (R29.898)     Time: 1610-9604 PT Time Calculation (min) (ACUTE ONLY): 28 min  Charges:  $Gait Training: 8-22 mins $Therapeutic Exercise: 8-22 mins                     Malachi Pro, DPT 12/03/2022, 11:01 AM

## 2022-12-04 DIAGNOSIS — E871 Hypo-osmolality and hyponatremia: Secondary | ICD-10-CM | POA: Diagnosis not present

## 2022-12-04 DIAGNOSIS — M25561 Pain in right knee: Secondary | ICD-10-CM | POA: Diagnosis not present

## 2022-12-04 DIAGNOSIS — M069 Rheumatoid arthritis, unspecified: Secondary | ICD-10-CM | POA: Diagnosis not present

## 2022-12-04 DIAGNOSIS — I480 Paroxysmal atrial fibrillation: Secondary | ICD-10-CM | POA: Diagnosis not present

## 2022-12-04 LAB — BASIC METABOLIC PANEL
Anion gap: 6 (ref 5–15)
BUN: 39 mg/dL — ABNORMAL HIGH (ref 8–23)
CO2: 26 mmol/L (ref 22–32)
Calcium: 8.4 mg/dL — ABNORMAL LOW (ref 8.9–10.3)
Chloride: 92 mmol/L — ABNORMAL LOW (ref 98–111)
Creatinine, Ser: 0.79 mg/dL (ref 0.44–1.00)
GFR, Estimated: >60 mL/min (ref >60)
Glucose, Bld: 137 mg/dL — ABNORMAL HIGH (ref 70–99)
Potassium: 4.5 mmol/L (ref 3.5–5.1)
Sodium: 124 mmol/L — ABNORMAL LOW (ref 135–145)

## 2022-12-04 LAB — CULTURE, BLOOD (ROUTINE X 2)
Culture: NO GROWTH
Culture: NO GROWTH
Special Requests: ADEQUATE

## 2022-12-04 MED ORDER — PREDNISONE 20 MG PO TABS
40.0000 mg | ORAL_TABLET | Freq: Every day | ORAL | Status: DC
  Administered 2022-12-04: 40 mg via ORAL
  Filled 2022-12-04: qty 2

## 2022-12-04 MED ORDER — ENSURE ENLIVE PO LIQD: 237.0000 mL | Freq: Two times a day (BID) | ORAL | 0 refills | Status: DC

## 2022-12-04 MED ORDER — NITROGLYCERIN 0.4 MG SL SUBL: 0.4000 mg | SUBLINGUAL_TABLET | SUBLINGUAL | 0 refills | Status: DC | PRN

## 2022-12-04 MED ORDER — CEPHALEXIN 500 MG PO CAPS: 500.0000 mg | ORAL_CAPSULE | Freq: Four times a day (QID) | ORAL | 0 refills | Status: AC

## 2022-12-04 MED ORDER — TRAMADOL HCL 50 MG PO TABS: 50.0000 mg | ORAL_TABLET | Freq: Four times a day (QID) | ORAL | 0 refills | Status: DC | PRN

## 2022-12-04 MED ORDER — SENNA 8.6 MG PO TABS
1.0000 | ORAL_TABLET | Freq: Every day | ORAL | Status: DC
  Administered 2022-12-04: 8.6 mg via ORAL
  Filled 2022-12-04: qty 1

## 2022-12-04 MED ORDER — LACTULOSE 10 GM/15ML PO SOLN: 20.0000 g | Freq: Every day | ORAL | Status: DC | PRN

## 2022-12-04 MED ORDER — POLYETHYLENE GLYCOL 3350 17 G PO PACK: 17.0000 g | PACK | Freq: Every day | ORAL | 0 refills | Status: DC | PRN

## 2022-12-04 MED ORDER — PREDNISONE 10 MG PO TABS: ORAL_TABLET | ORAL | 0 refills | Status: DC

## 2022-12-04 MED ORDER — SODIUM CHLORIDE 1 G PO TABS
1.0000 g | ORAL_TABLET | Freq: Three times a day (TID) | ORAL | Status: DC
  Filled 2022-12-04 (×2): qty 1

## 2022-12-04 MED ORDER — FUROSEMIDE 20 MG PO TABS: 20.0000 mg | ORAL_TABLET | Freq: Every day | ORAL | 0 refills | Status: DC

## 2022-12-04 MED ORDER — BISACODYL 5 MG PO TBEC
5.0000 mg | DELAYED_RELEASE_TABLET | Freq: Once | ORAL | Status: AC
  Administered 2022-12-04: 5 mg via ORAL
  Filled 2022-12-04: qty 1

## 2022-12-04 MED ORDER — BISACODYL 10 MG RE SUPP
10.0000 mg | Freq: Once | RECTAL | Status: AC
  Administered 2022-12-04: 10 mg via RECTAL
  Filled 2022-12-04: qty 1

## 2022-12-04 MED ORDER — FLEET ENEMA 7-19 GM/118ML RE ENEM: 1.0000 | ENEMA | Freq: Every day | RECTAL | Status: DC | PRN

## 2022-12-04 MED ORDER — ASPIRIN 81 MG PO TBEC: 81.0000 mg | DELAYED_RELEASE_TABLET | Freq: Every day | ORAL | 0 refills | Status: DC

## 2022-12-04 MED ORDER — SODIUM CHLORIDE 1 G PO TABS: 1.0000 g | ORAL_TABLET | Freq: Three times a day (TID) | ORAL | 0 refills | Status: DC

## 2022-12-04 MED ORDER — POLYETHYLENE GLYCOL 3350 17 G PO PACK
17.0000 g | PACK | Freq: Every day | ORAL | Status: DC
  Administered 2022-12-04: 17 g via ORAL
  Filled 2022-12-04: qty 1

## 2022-12-04 NOTE — Progress Notes (Signed)
       CROSS COVER NOTE  NAME: Kaitlyn Huff MRN: [id number] DOB : 1941/02/24    HPI/Events of Note   Report:constipation, last BM 3 days ago; takes orla dulcolax at home for constipation  On review of chart:POD 2 for  I & D right knee    Assessment and  Interventions   Assessment:  Plan: Dose of bisacodyl ordered Start daily fiver supplement for prevention - senokot ordered      Donnie Mesa NP [facility] Cross Cover 7pm-7am - check amion for availability Pager [phone]

## 2022-12-04 NOTE — Discharge Instructions (Signed)
Recommend checking bmp in follow up appointment with your medical doctor.  For hyponatremia: fluid restriction 1500 ml per day, salt tablets, low dose lasix and ensure.

## 2022-12-04 NOTE — Progress Notes (Signed)
  Subjective:  POD # 4 that is post arthroscopic I&D of the right knee.   Patient reports right knee pain as mild.  Patient on the bedside commode.  A Family member is at the bedside.    Objective:   VITALS:   Vitals:   12/03/22 1631 12/03/22 2324 12/04/22 0533 12/04/22 0804  BP: 130/65 125/77  (!) 120/59  Pulse: 61 73  79  Resp: 17 16  17   Temp: 98.1 F (36.7 C) 98.3 F (36.8 C)  98.3 F (36.8 C)  TempSrc:      SpO2: 93% 92%  93%  Weight:   54.4 kg   Height:        PHYSICAL EXAM: Right lower extremity Neurovascular intact Sensation intact distally Intact pulses distally Dorsiflexion/Plantar flexion intact Incision: dressing C/D/I No cellulitis present Compartment soft  LABS  Results for orders placed or performed during the hospital encounter of 11/29/22 (from the past 24 hour(s))  Basic metabolic panel     Status: Abnormal   Collection Time: 12/04/22  5:07 AM  Result Value Ref Range   Sodium 124 (L) 135 - 145 mmol/L   Potassium 4.5 3.5 - 5.1 mmol/L   Chloride 92 (L) 98 - 111 mmol/L   CO2 26 22 - 32 mmol/L   Glucose, Bld 137 (H) 70 - 99 mg/dL   BUN 39 (H) 8 - 23 mg/dL   Creatinine, Ser 1.61 0.44 - 1.00 mg/dL   Calcium 8.4 (L) 8.9 - 10.3 mg/dL   GFR, Estimated >09 >60 mL/min   Anion gap 6 5 - 15    No results found.  Assessment/Plan: 4 Days Post-Op   Principal Problem:   Hyponatremia Active Problems:   Hypercholesterolemia   Rheumatoid arthritis flare (HCC)   GERD (gastroesophageal reflux disease)   Hypertension   Obstructive sleep apnea   Paroxysmal atrial fibrillation (HCC)   Acute pain of right knee   Chest pain  Patient is stable from an orthopedic standpoint.  She may be discharged home when cleared medically.  Patient may follow-up in our office in 10 to 14 days for wound check and suture removal.  Patient may continue elevating her right lower extremity and using her Polar Care at home as needed.  Will defer to hospitalist regarding  anticoagulation following discharge.  Would recommend at least continuing aspirin 81 mg daily if no other anticoagulant is ordered.  Patient should call [facility] in [facility] for follow-up appointment at [phone].  The [facility] urgent care is open daily from 9 AM to 9 PM for walk-in appointments if the patient needs to be urgently seen.   Juanell Fairly , MD 12/04/2022, 12:24 PM

## 2022-12-04 NOTE — Plan of Care (Signed)

## 2022-12-04 NOTE — Discharge Summary (Signed)
Physician Discharge Summary   Patient: Kaitlyn Huff MRN: [id number] DOB: 30-Jun-1941  Admit date:     11/29/2022  Discharge date: 12/04/22  Discharge Physician: Alford Highland   PCP: Dale West Milton, MD   Recommendations at discharge:   Follow-up PCP 5 days Follow-up cardiology 1 to 2 weeks Follow-up orthopedic surgery Dr. Martha Clan 7 to 10 days to remove sutures. Recommend checking a BMP and follow-up appointment  Discharge Diagnoses: Principal Problem:   Hyponatremia Active Problems:   Acute pain of right knee   Rheumatoid arthritis flare (HCC)   Paroxysmal atrial fibrillation (HCC)   Hypertension   Hypercholesterolemia   Obstructive sleep apnea   GERD (gastroesophageal reflux disease)   Chest pain    Hospital Course: 82 y.o. female with medical history significant of   HLD, HTN , RA on infusion q 2 months, valvular heart disease stable, GERD,  who presents to ED  with one day  swelling and pain  of right knee. She notes no associated fever/chills / sob/ chest pain / n/v/ or abdominal pain. Patient notes current symptoms are similar to her typical flare but more painful.  Patient denies any fall or injury. Patient states she noted symptoms on awaking this am. She also note she is unable to walk due to pain. She states she had a near fall prior to presenting to ED.  She also notes pain in her left knee but states it is not as severe.   5/22.  Right knee aspiration does not show any organisms or crystals.  White blood cell count 38,335 and fluid with 80% neutrophils.  Patient will be brought to the operating room today by Dr. Martha Clan. Post-operatively went into afib. 5/23.  POD 1. Left knee pain.  Still in atrial fibrillation.  Had chest pain in the evening.  Cardiac enzymes flat.  Started on aspirin.  CT scan of the chest showed moderate hiatal hernia and no pulmonary embolism and coronary artery calcification. 5/24.  Patient complaining of left elbow pain.  Case discussed  with ID and she was okay with me starting steroids.  Antibiotics switched over to Ancef for tomorrow.  Sodium dipped down to 125 and started fluid restriction. 5/25.  Walked better with physical therapy.  Elbow pain improved.  Sodium dropped down to 123 and started salt tablets.  Started Ensure. 5/26.  Sodium 124.  Increase salt tablets to 3 times daily dosing.  Patient's daughter wanted to take the patient home.  Since the patient is mentating fine, I will send home with close follow-up and will need a BMP and follow-up appointment.    Assessment and Plan: * Hyponatremia Sodium dropped down to 124.  Likely secondary to not eating very well.  Patient advised to eat.  Patient's daughter states that she will eat better at home.  In the meantime I increased her salt tablets to 3 times a day dosing and will give low-dose Lasix and Ensure.  Recommend checking a BMP and follow-up appointment.  Acute pain of right knee With joint effusion.  Currently no organisms or crystals seen on aspiration.  11,914 white blood cells on aspiration.  OR cultures did not show any organism also.  ID switched antibiotics over to Ancef and will switch over to Keflex upon discharge.  Paroxysmal atrial fibrillation (HCC) Continue cardizem cd.  Patient postoperatively went into afib but rate controlled.  Reviewed prior cardiology notes and with daughter about no anticoagulation with fall risk.  Higher risk of stroke without anticoagulation.  On aspirin alone.  Rheumatoid arthritis flare (HCC) Pain bilateral knees.  Right knee effusion.  Also acute pain in left elbow.  Knee pain and elbow pain better.  Solu-Medrol switched over to prednisone and will give a prednisone taper upon going home.  Hypertension Continue Cardizem CD and spironolactone  Hypercholesterolemia Continue statin  Obstructive sleep apnea On oxygen at night  GERD (gastroesophageal reflux disease) On PPI  Chest pain Resolved.  Follow-up with  cardiology as outpatient.  Started on aspirin.  Troponins only borderline but flat.         Consultants: Orthopedic surgery, cardiology, infectious disease Procedures performed: Right knee joint aspiration, right knee irrigation and debridement with partial medial meniscus knee and extensive synovectomy Disposition: Home health Diet recommendation:  Regular diet DISCHARGE MEDICATION: Allergies as of 12/04/2022       Reactions   Astelin [azelastine Hcl] Other (See Comments)   Reaction:  Unknown    Codeine Other (See Comments)   Reaction:  Altered mental status   Dilaudid [hydromorphone] Other (See Comments)   Reaction:  Unknown    Flexeril [cyclobenzaprine] Other (See Comments)   Reaction:  Unknown    Imuran [azathioprine] Other (See Comments)   Reaction:  Abnormal liver function   Lisinopril Itching   Lyrica [pregabalin] Other (See Comments)   Reaction:  Sore gums    Methotrexate Derivatives Other (See Comments)   Reaction:  Abnormal liver function   Amiodarone Anxiety, Other (See Comments)   Pt could not eat or sleep. Caused n/v and "messed her up"   Amoxicillin Rash, Other (See Comments)   Patient has tolerated multiple cepalosporins Unable to obtain enough information to answer additional questions about this medication.     Arava [leflunomide] Rash   Clindamycin/lincomycin Rash   Doxycycline Rash   Lodine [etodolac] Rash   Percocet [oxycodone-acetaminophen] Rash   Sulfa Antibiotics Rash        Medication List     STOP taking these medications    diltiazem 30 MG tablet Commonly known as: CARDIZEM       TAKE these medications    albuterol 108 (90 Base) MCG/ACT inhaler Commonly known as: VENTOLIN HFA USE 2 INHALATIONS EVERY 6 HOURS AS NEEDED FOR WHEEZING OR SHORTNESS OF BREATH   ALIGN PO Take 1 tablet by mouth daily.   ALPRAZolam 0.25 MG tablet Commonly known as: XANAX Take 1 tablet (0.25 mg total) by mouth 2 (two) times daily as needed for  anxiety.   aspirin EC 81 MG tablet Take 1 tablet (81 mg total) by mouth daily. Swallow whole. Start taking on: Dec 05, 2022   atorvastatin 20 MG tablet Commonly known as: LIPITOR Take 20 mg by mouth daily.   cephALEXin 500 MG capsule Commonly known as: KEFLEX Take 1 capsule (500 mg total) by mouth 4 (four) times daily for 4 days.   cetirizine 10 MG tablet Commonly known as: ZYRTEC Take 1 tablet (10 mg total) by mouth daily.   colestipol 1 g tablet Commonly known as: COLESTID Take 2 g by mouth daily.   diltiazem 180 MG 24 hr capsule Commonly known as: CARDIZEM CD Take 180 mg by mouth daily.   feeding supplement Liqd Take 237 mLs by mouth 2 (two) times daily between meals.   fluticasone 50 MCG/ACT nasal spray Commonly known as: FLONASE USE 2 SPRAYS IN EACH NOSTRIL DAILY (CALL OFFICE SOON TO RESCHEDULE YOUR PHYSICAL)   furosemide 20 MG tablet Commonly known as: LASIX Take 1 tablet (20 mg total) by  mouth daily. Start taking on: Dec 05, 2022   gabapentin 100 MG capsule Commonly known as: NEURONTIN Take 100 mg by mouth daily.   lip balm Oint Apply 1 application topically as needed for lip care.   multivitamin with minerals Tabs tablet Take 1 tablet by mouth daily.   nitroGLYCERIN 0.4 MG SL tablet Commonly known as: NITROSTAT Place 1 tablet (0.4 mg total) under the tongue every 5 (five) minutes as needed for chest pain (maximum 3).   omeprazole 20 MG capsule Commonly known as: PRILOSEC Take 1 capsule by mouth daily.   polyethylene glycol 17 g packet Commonly known as: MIRALAX / GLYCOLAX Take 17 g by mouth daily as needed for moderate constipation.   predniSONE 10 MG tablet Commonly known as: DELTASONE 4 tabs po day 1; 3 tabs po day 2; 2 tabs po day3,4; 1 tab po day5,6; 1/2 tab po day 7,8   sodium chloride 1 g tablet Take 1 tablet (1 g total) by mouth 3 (three) times daily with meals.   spironolactone 25 MG tablet Commonly known as: ALDACTONE Take 12.5 mg  by mouth daily.   traMADol 50 MG tablet Commonly known as: ULTRAM Take 1 tablet (50 mg total) by mouth every 6 (six) hours as needed for moderate pain.               Durable Medical Equipment  (From admission, onward)           Start     Ordered   12/02/22 1435  For home use only DME Bedside commode  Once       Question:  Patient needs a bedside commode to treat with the following condition  Answer:  Impaired mobility   12/02/22 1434            Follow-up Information     Paraschos, Alexander, MD. Go in 1 week(s).   Specialty: Cardiology Contact information: [address] [facility] [place] [address] [phone]         Dale Ingalls, MD Follow up in 5 day(s).   Specialty: Internal Medicine Contact information: [address] [place] [address] [phone]         Juanell Fairly, MD Follow up in 1 week(s).   Specialty: Orthopedic Surgery Why: to remove sutures right knee Contact information: [address] [place] [address] [phone]                Discharge Exam: Filed Weights   11/30/22 1401 12/01/22 0500 12/04/22 0533  Weight: 54.4 kg 56.6 kg 54.4 kg   Physical Exam HENT:     Head: Normocephalic.     Mouth/Throat:     Pharynx: No oropharyngeal exudate.  Eyes:     General: Lids are normal.     Conjunctiva/sclera: Conjunctivae normal.  Cardiovascular:     Rate and Rhythm: Normal rate. Rhythm irregularly irregular.     Heart sounds: Normal heart sounds, S1 normal and S2 normal.  Pulmonary:     Breath sounds: No decreased breath sounds, wheezing, rhonchi or rales.  Chest:     Comments: No pain over chest wall Abdominal:     Palpations: Abdomen is soft.     Tenderness: There is no abdominal tenderness.  Musculoskeletal:     Left elbow: Normal range of motion. No tenderness.     Comments: Right knee covered  Skin:    General: Skin is warm.      Findings: No rash.  Neurological:     Mental Status: She is alert and oriented to person, place, and time.      Condition at discharge: serious  The results of significant diagnostics from this hospitalization (including imaging, microbiology, ancillary and laboratory) are listed below for reference.   Imaging Studies: ECHOCARDIOGRAM COMPLETE  Result Date: 12/02/2022    ECHOCARDIOGRAM REPORT   Patient Name:   SHAMIKA BARCA [facility] Date of Exam: 12/01/2022 Medical Rec #:  [id number]      Height:       65.0 in Accession #:    [id number]     Weight:       124.8 lb Date of Birth:  [birth year]     BSA:          1.619 m Patient Age:    42 years       BP:           125/61 mmHg Patient Gender: F              HR:           65 bpm. Exam Location:  [facility] Procedure: 2D Echo, Cardiac Doppler and Color Doppler Indications:     Atrial Flutter  History:         Patient has prior history of Echocardiogram examinations, most                  recent 11/23/2020. Arrythmias:Atrial Fibrillation, Atrial                  Flutter and Tachycardia,                  Signs/Symptoms:Dizziness/Lightheadedness, Fatigue, Edema and                  Dyspnea; Risk Factors:Hypertension and Sleep Apnea.  Sonographer:     Mikki Harbor Referring Phys:  [id number] Alford Highland Diagnosing Phys: Alwyn Pea MD IMPRESSIONS  1. Left ventricular ejection fraction, by estimation, is 60 to 65%. The left ventricle has normal function. The left ventricle has no regional wall motion abnormalities. There is mild concentric left ventricular hypertrophy. Left ventricular diastolic parameters are consistent with Grade I diastolic dysfunction (impaired relaxation).  2. Right ventricular systolic function is normal. The right ventricular size is normal. There is mildly elevated pulmonary artery systolic pressure.  3. Left atrial size was mildly dilated.  4. Right atrial size was mildly dilated.  5. The mitral valve is normal in structure. Mild mitral valve  regurgitation.  6. Tricuspid valve regurgitation is moderate.  7. The aortic valve is grossly normal. Aortic valve regurgitation is mild to moderate. Aortic valve sclerosis is present, with no evidence of aortic valve stenosis. FINDINGS  Left Ventricle: Left ventricular ejection fraction, by estimation, is 60 to 65%. The left ventricle has normal function. The left ventricle has no regional wall motion abnormalities. The left ventricular internal cavity size was normal in size. There is  mild concentric left ventricular hypertrophy. Left ventricular diastolic parameters are consistent with Grade I diastolic dysfunction (impaired relaxation). Right Ventricle: The right ventricular size is normal. No increase in right ventricular wall thickness. Right ventricular systolic function is normal. There is mildly elevated pulmonary artery systolic pressure. The tricuspid regurgitant velocity is 2.87  m/s, and with an assumed right atrial pressure of 8 mmHg, the estimated right ventricular systolic pressure is 40.9 mmHg. Left Atrium: Left atrial size was mildly dilated. Right Atrium: Right atrial size was mildly dilated. Pericardium: There is  no evidence of pericardial effusion. Mitral Valve: The mitral valve is normal in structure. Mild mitral valve regurgitation. MV peak gradient, 3.2 mmHg. The mean mitral valve gradient is 1.0 mmHg. Tricuspid Valve: The tricuspid valve is grossly normal. Tricuspid valve regurgitation is moderate. Aortic Valve: The aortic valve is grossly normal. Aortic valve regurgitation is mild to moderate. Aortic valve sclerosis is present, with no evidence of aortic valve stenosis. Aortic valve mean gradient measures 5.0 mmHg. Aortic valve peak gradient measures 11.9 mmHg. Aortic valve area, by VTI measures 1.95 cm. Pulmonic Valve: The pulmonic valve was grossly normal. Pulmonic valve regurgitation is mild. Aorta: The ascending aorta was not well visualized. IAS/Shunts: No atrial level shunt detected  by color flow Doppler.  LEFT VENTRICLE PLAX 2D LVIDd:         4.70 cm LVIDs:         3.00 cm LV PW:         1.20 cm LV IVS:        1.20 cm LVOT diam:     1.90 cm LV SV:         58 LV SV Index:   36 LVOT Area:     2.84 cm  RIGHT VENTRICLE RV Basal diam:  3.90 cm RV Mid diam:    2.70 cm RV S prime:     11.20 cm/s LEFT ATRIUM              Index        RIGHT ATRIUM           Index LA diam:        4.60 cm  2.84 cm/m   RA Area:     27.80 cm LA Vol (A2C):   108.0 ml 66.72 ml/m  RA Volume:   83.30 ml  51.46 ml/m LA Vol (A4C):   58.9 ml  36.39 ml/m LA Biplane Vol: 80.0 ml  49.42 ml/m  AORTIC VALVE                    PULMONIC VALVE AV Area (Vmax):    1.82 cm     PV Vmax:       1.37 m/s AV Area (Vmean):   1.86 cm     PV Peak grad:  7.5 mmHg AV Area (VTI):     1.95 cm AV Vmax:           172.50 cm/s AV Vmean:          96.800 cm/s AV VTI:            0.294 m AV Peak Grad:      11.9 mmHg AV Mean Grad:      5.0 mmHg LVOT Vmax:         111.00 cm/s LVOT Vmean:        63.400 cm/s LVOT VTI:          0.203 m LVOT/AV VTI ratio: 0.69  AORTA Ao Root diam: 3.40 cm Ao Asc diam:  3.70 cm MITRAL VALVE               TRICUSPID VALVE MV Area (PHT): 3.53 cm    TR Peak grad:   32.9 mmHg MV Area VTI:   2.90 cm    TR Vmax:        287.00 cm/s MV Peak grad:  3.2 mmHg MV Mean grad:  1.0 mmHg    SHUNTS MV Vmax:       0.89 m/s  Systemic VTI:  0.20 m MV Vmean:      53.6 cm/s   Systemic Diam: 1.90 cm MV Decel Time: 215 msec MV E velocity: 71.00 cm/s Alwyn Pea MD Electronically signed by Alwyn Pea MD Signature Date/Time: 12/02/2022/8:00:44 AM    Final    CT Angio Chest Pulmonary Embolism (PE) W or WO Contrast  Result Date: 12/01/2022 CLINICAL DATA:  PE suspected, negative D-dimer EXAM: CT ANGIOGRAPHY CHEST WITH CONTRAST TECHNIQUE: Multidetector CT imaging of the chest was performed using the standard protocol during bolus administration of intravenous contrast. Multiplanar CT image reconstructions and MIPs were obtained to  evaluate the vascular anatomy. RADIATION DOSE REDUCTION: This exam was performed according to the departmental dose-optimization program which includes automated exposure control, adjustment of the mA and/or kV according to patient size and/or use of iterative reconstruction technique. CONTRAST:  75mL OMNIPAQUE IOHEXOL 350 MG/ML SOLN COMPARISON:  None Available. FINDINGS: Cardiovascular: Satisfactory opacification of the pulmonary arteries to the segmental level. No evidence of pulmonary embolism. Cardiomegaly. Three-vessel coronary artery calcifications. No pericardial effusion. Mediastinum/Nodes: No enlarged mediastinal, hilar, or axillary lymph nodes. Moderate hiatal hernia with intrathoracic position of the gastric fundus. Thyroid gland, trachea, and esophagus demonstrate no significant findings. Lungs/Pleura: Small bilateral pleural effusions and associated atelectasis or consolidation Upper Abdomen: No acute abnormality.  Status post cholecystectomy. Musculoskeletal: No chest wall abnormality. No acute osseous findings. Review of the MIP images confirms the above findings. IMPRESSION: 1. Negative examination for pulmonary embolism. 2. Small bilateral pleural effusions and associated atelectasis or consolidation. 3. Cardiomegaly and coronary artery disease. 4. Moderate hiatal hernia with intrathoracic position of the gastric fundus. Electronically Signed   By: Jearld Lesch M.D.   On: 12/01/2022 19:12   DG Cervical Spine 2 or 3 views  Result Date: 12/01/2022 CLINICAL DATA:  Neck pain EXAM: CERVICAL SPINE - 2-3 VIEW COMPARISON:  CT 06/07/15 FINDINGS: No acute posttraumatic malalignment of the cervical spine. Unchanged stepwise first degree anterolisthesis of C4 on C5 and C5 on C6. disc space narrowing and endplate spurring is identified at C5-6 and C6-7. Bilateral multilevel facet arthropathy identified. No signs of acute fracture or dislocation. Prevertebral soft tissues are unremarkable. Left-sided carotid  artery calcifications noted. IMPRESSION: 1. No acute findings. 2. 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 3. Left-sided carotid artery calcifications. Consider further evaluation with carotid ultrasound. Electronically Signed   By: Signa Kell M.D.   On: 12/01/2022 15:30   DG Knee Complete 4 Views Right  Result Date: 11/29/2022 CLINICAL DATA:  Bilateral knee pain. EXAM: RIGHT KNEE - COMPLETE 4+ VIEW COMPARISON:  February 27, 2019. FINDINGS: No definite fracture or dislocation is noted. Large suprapatellar joint effusion is noted. 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 IMPRESSION: Large suprapatellar joint effusion. Mild degenerative change is noted medially. No fracture or dislocation. Electronically Signed   By: Lupita Raider M.D.   On: 11/29/2022 14:36    Microbiology: Results for orders placed or performed during the hospital encounter of 11/29/22  Body fluid culture w Gram Stain     Status: None   Collection Time: 11/29/22  5:36 PM   Specimen: Synovium; Body Fluid  Result Value Ref Range Status   Specimen Description   Final    SYNOVIAL KNEE JOINT Performed at [facility], [address] [place], [place] [address]    Special Requests   Final    SYNOVIAL Performed at [facility], [address], [place], [place]  [address]    Gram Stain   Final    MODERATE WBC PRESENT,BOTH PMN AND MONONUCLEAR NO ORGANISMS SEEN    Culture   Final    NO GROWTH 3 DAYS Performed at [facility], [address], [place], [place] [address]    Report Status 12/03/2022 FINAL  Final  Culture, blood (routine x 2)     Status: None   Collection Time: 11/29/22 10:11 PM   Specimen: BLOOD  Result Value Ref Range Status   Specimen Description BLOOD  LEFT AC  Final   Special Requests   Final    BOTTLES DRAWN AEROBIC AND ANAEROBIC Blood Culture adequate volume   Culture   Final    NO GROWTH 5 DAYS Performed at [facility],  [address] [place], [place] [address]    Report Status 12/04/2022 FINAL  Final  Culture, blood (routine x 2)     Status: None   Collection Time: 11/29/22 10:11 PM   Specimen: BLOOD  Result Value Ref Range Status   Specimen Description BLOOD  RIGHT HAND  Final   Special Requests   Final    BOTTLES DRAWN AEROBIC AND ANAEROBIC Blood Culture adequate volume   Culture   Final    NO GROWTH 5 DAYS Performed at [facility], [address], [place], [place] [address]    Report Status 12/04/2022 FINAL  Final  Body fluid culture w Gram Stain     Status: None   Collection Time: 11/30/22  2:52 PM   Specimen: Synovium  Result Value Ref Range Status   Specimen Description SYNOVIAL  Final   Special Requests RIGHT KNEE  Final   Gram Stain   Final    ABUNDANT WBC PRESENT, PREDOMINANTLY PMN NO ORGANISMS SEEN    Culture   Final    NO GROWTH 3 DAYS Performed at [facility], [address] [place], [place] [address]    Report Status 12/03/2022 FINAL  Final  Gram stain     Status: None   Collection Time: 11/30/22  3:13 PM   Specimen: Path fluid; Body Fluid  Result Value Ref Range Status   Specimen Description SYNOVIAL  Final   Special Requests RIGHT KNEE  Final   Gram Stain   Final    FEW WBC SEEN RARE RBC SEEN NO ORGANISMS SEEN CALLED TO DR. KRAZINSKI 11/30/22 @ 1539 BY SB Performed at [facility], [address] [place] [place], [place] [address]    Report Status 12/01/2022 FINAL  Final    Labs: CBC: Recent Labs  Lab 11/29/22 1500 11/30/22 0547 12/01/22 0913 12/02/22 0753 12/03/22 0739  WBC 13.9* 15.3* 13.2* 12.5* 15.4*  NEUTROABS 10.5* 11.7*  --   --   --   HGB 14.1 13.5 12.0 11.7* 10.0*  HCT 41.2 39.1 34.4* 33.5* 28.3*  MCV 93.0 92.9 91.7 91.0 90.1  PLT 243 239 212 245 251   Basic Metabolic Panel: Recent Labs  Lab 11/29/22 1500 11/30/22 0547 12/01/22 0455 12/02/22 0542 12/03/22 0737 12/04/22 0507  NA 131*  --   --  125* 123* 124*  K 4.7   --   --  4.1 4.4 4.5  CL 95*  --   --  94* 91* 92*  CO2 25  --   --  23 23 26   GLUCOSE 136*  --   --  129* 148* 137*  BUN 14  --   --  27* 35* 39*  CREATININE 1.04*  0.94 1.03* 0.91 0.95 0.79  CALCIUM 9.4  --   --  8.2* 8.4* 8.4*    CBG: Recent Labs  Lab 11/30/22 0747  GLUCAP 130*    Discharge time spent: greater than 30 minutes.  Signed: Alford Highland, MD [facility] 12/04/2022

## 2022-12-06 ENCOUNTER — Telehealth: Payer: Self-pay

## 2022-12-06 ENCOUNTER — Telehealth: Payer: Self-pay | Admitting: *Deleted

## 2022-12-06 NOTE — Transitions of Care (Post Inpatient/ED Visit) (Signed)
12/06/2022  Name: Kaitlyn Huff MRN: [id number] DOB: 14-Nov-1940  Today's TOC FU Call Status: Today's TOC FU Call Status:: Successful TOC FU Call Competed TOC FU Call Complete Date: 12/06/22  Transition Care Management Follow-up Telephone Call Date of Discharge: 12/04/22 Discharge Facility: [facility]) Type of Discharge: Inpatient Admission Primary Inpatient Discharge Diagnosis:: Hyponatremia How have you been since you were released from the hospital?: Better Any questions or concerns?: Yes Patient Questions/Concerns:: Pt is to have an infusion tomorrow. Daughter not sure if can be given. She is calling Rheumatologist Patient Questions/Concerns Addressed: Other:  Items Reviewed: Did you receive and understand the discharge instructions provided?: Yes Medications obtained,verified, and reconciled?: Yes (Medications Reviewed) Any new allergies since your discharge?: No Dietary orders reviewed?: No Do you have support at home?: Yes People in Home: child(ren), adult Name of Support/Comfort Primary Source: Kendal Hymen  Medications Reviewed Today: Medications Reviewed Today     Reviewed by Luella Cook, RN (Case Manager) on 12/06/22 at 1202  Med List Status: <None>   Medication Order Taking? Sig Documenting Provider Last Dose Status Informant  albuterol (VENTOLIN HFA) 108 (90 Base) MCG/ACT inhaler [id number] Yes USE 2 INHALATIONS EVERY 6 HOURS AS NEEDED FOR WHEEZING OR SHORTNESS OF Wandra Scot, MD Taking Active Multiple Informants, Pharmacy Records, Self, Other  ALPRAZolam Prudy Feeler) 0.25 MG tablet [id number] Yes Take 1 tablet (0.25 mg total) by mouth 2 (two) times daily as needed for anxiety. Enid Baas, MD Taking Active Multiple Informants, Pharmacy Records, Self, Other  aspirin EC 81 MG tablet [id number] Yes Take 1 tablet (81 mg total) by mouth daily. Swallow whole. Alford Highland, MD Taking Active   atorvastatin (LIPITOR) 20 MG tablet  [id number] Yes Take 20 mg by mouth daily. [provider] Taking Active Multiple Informants, Pharmacy Records, Self, Other  cephALEXin (KEFLEX) 500 MG capsule [id number] Yes Take 1 capsule (500 mg total) by mouth 4 (four) times daily for 4 days. Alford Highland, MD Taking Active   cetirizine (ZYRTEC) 10 MG tablet [id number] Yes Take 1 tablet (10 mg total) by mouth daily. Dale Stockham, MD Taking Active Multiple Informants, Pharmacy Records, Self, Other  colestipol (COLESTID) 1 g tablet [id number] Yes Take 2 g by mouth daily. [provider] Taking Active Multiple Informants, Pharmacy Records, Self, Other  diltiazem (CARDIZEM CD) 180 MG 24 hr capsule [id number] Yes Take 180 mg by mouth daily. [provider] Taking Active Multiple Informants, Pharmacy Records, Self, Other  feeding supplement (ENSURE ENLIVE / ENSURE PLUS) LIQD [id number] Yes Take 237 mLs by mouth 2 (two) times daily between meals. Alford Highland, MD Taking Active   fluticasone [facility]) 50 MCG/ACT nasal spray [id number] Yes USE 2 SPRAYS IN EACH NOSTRIL DAILY (CALL OFFICE SOON TO RESCHEDULE YOUR PHYSICAL) Dale [place], MD Taking Active Multiple Informants, Pharmacy Records, Self, Other  furosemide (LASIX) 20 MG tablet [id number] Yes Take 1 tablet (20 mg total) by mouth daily. Alford Highland, MD Taking Active   gabapentin (NEURONTIN) 100 MG capsule [id number] Yes Take 100 mg by mouth daily. [provider] Taking Active Multiple Informants, Pharmacy Records, Self, Other  lip balm (BLISTEX) OINT [id number] Yes Apply 1 application topically as needed for lip care. Alford Highland, MD Taking Active Multiple Informants, Pharmacy Records, Self, Other  Multiple Vitamin (MULTIVITAMIN WITH MINERALS) TABS tablet [id number] Yes Take 1 tablet by mouth daily. [provider] Taking Active Multiple Informants, Pharmacy Records, Self, Other  nitroGLYCERIN (NITROSTAT) 0.4 MG SL tablet [id number] Yes Place 1 tablet  (  0.4 mg total) under the tongue every 5 (five) minutes as needed for chest pain (maximum 3). Alford Highland, MD Taking Active   omeprazole (PRILOSEC) 20 MG capsule [id number] Yes Take 1 capsule by mouth daily. [provider] Taking Active Multiple Informants, Pharmacy Records, Self, Other  polyethylene glycol (MIRALAX / GLYCOLAX) 17 g packet [id number] Yes Take 17 g by mouth daily as needed for moderate constipation. Alford Highland, MD Taking Active   predniSONE (DELTASONE) 10 MG tablet [id number] Yes 4 tabs po day 1; 3 tabs po day 2; 2 tabs po day3,4; 1 tab po day5,6; 1/2 tab po day 7,8 Alford Highland, MD Taking Active   Probiotic Product (ALIGN PO) [id number] Yes Take 1 tablet by mouth daily. [provider] Taking Active Multiple Informants, Pharmacy Records, Self, Other  sodium chloride 1 g tablet [id number] Yes Take 1 tablet (1 g total) by mouth 3 (three) times daily with meals. Alford Highland, MD Taking Active   spironolactone (ALDACTONE) 25 MG tablet [id number] Yes Take 12.5 mg by mouth daily. [provider] Taking Active Multiple Informants, Pharmacy Records, Self, Other  traMADol (ULTRAM) 50 MG tablet [id number] Yes Take 1 tablet (50 mg total) by mouth every 6 (six) hours as needed for moderate pain. Alford Highland, MD Taking Active             Home Care and Equipment/Supplies: Were Home Health Services Ordered?: Yes Name of Home Health Agency:: [facility] Has Agency set up a time to come to your home?: No EMR reviewed for Home Health Orders: Orders present/patient has not received call (refer to CM for follow-up) Any new equipment or medical supplies ordered?: Yes Name of Medical supply agency?: [facility]) Were you able to get the equipment/medical supplies?: Yes Do you have any questions related to the use of the equipment/supplies?: No  Functional Questionnaire: Do you need assistance with bathing/showering or dressing?: Yes Do you need  assistance with meal preparation?: Yes Do you need assistance with eating?: No Do you have difficulty maintaining continence: No Do you need assistance with getting out of bed/getting out of a chair/moving?: Yes Do you have difficulty managing or taking your medications?: No  Follow up appointments reviewed: PCP Follow-up appointment confirmed?: Yes Date of PCP follow-up appointment?: 12/08/22 Follow-up Provider: Dr Lorin Picket 11:00 Specialist [facility] Follow-up appointment confirmed?: Yes Date of Specialist follow-up appointment?: 12/13/22 Follow-Up Specialty Provider:: Cardology [id number], Orthopedic [id number] Do you need transportation to your follow-up appointment?: No Do you understand care options if your condition(s) worsen?: Yes-patient verbalized understanding  SDOH Interventions Today    Flowsheet Row Most Recent Value  SDOH Interventions   Food Insecurity Interventions Intervention Not Indicated  Housing Interventions Intervention Not Indicated  Transportation Interventions Intervention Not Indicated, Patient Resources (Friends/Family)      Interventions Today    Flowsheet Row Most Recent Value  General Interventions   General Interventions Discussed/Reviewed General Interventions Discussed, General Interventions Reviewed, Doctor Visits  Doctor Visits Discussed/Reviewed Doctor Visits Discussed, Doctor Visits Reviewed  Education Interventions   Education Provided Provided Education  Provided Verbal Education On Other  Nutrition Interventions   Nutrition Discussed/Reviewed Nutrition Discussed  [patient appetite is better since home]  Pharmacy Interventions   Pharmacy Dicussed/Reviewed Pharmacy Topics Discussed, Pharmacy Topics Reviewed      TOC Interventions Today    Flowsheet Row Most Recent Value  TOC Interventions   TOC Interventions Discussed/Reviewed TOC Interventions Discussed, TOC Interventions Reviewed  [RN discussed other uses for BDC to make elevation seat  over  toliet]      Patient declined Care Coordination needs  Gean Maidens BSN RN [facility] Care [facility] [phone]

## 2022-12-06 NOTE — Telephone Encounter (Signed)
[facility] states they received a referral for PT and nursing for patient and patient's daughter, Kaitlyn Huff, would like for them to come to see the patient on 12/07/2022 instead of today.

## 2022-12-06 NOTE — Telephone Encounter (Signed)
Noted. Has HFU 5/30

## 2022-12-07 ENCOUNTER — Other Ambulatory Visit: Payer: Self-pay | Admitting: Physician Assistant

## 2022-12-07 ENCOUNTER — Telehealth: Payer: Self-pay

## 2022-12-07 ENCOUNTER — Ambulatory Visit
Admission: RE | Admit: 2022-12-07 | Discharge: 2022-12-07 | Disposition: A | Discharge status: Home / Self Care | Payer: Medicare Other | Source: Ambulatory Visit | Attending: Physician Assistant | Admitting: Physician Assistant

## 2022-12-07 DIAGNOSIS — I110 Hypertensive heart disease with heart failure: Secondary | ICD-10-CM | POA: Diagnosis present

## 2022-12-07 DIAGNOSIS — Z66 Do not resuscitate: Secondary | ICD-10-CM | POA: Diagnosis present

## 2022-12-07 DIAGNOSIS — Z880 Allergy status to penicillin: Secondary | ICD-10-CM | POA: Diagnosis not present

## 2022-12-07 DIAGNOSIS — Z5329 Procedure and treatment not carried out because of patient's decision for other reasons: Secondary | ICD-10-CM | POA: Diagnosis present

## 2022-12-07 DIAGNOSIS — R2241 Localized swelling, mass and lump, right lower limb: Secondary | ICD-10-CM

## 2022-12-07 DIAGNOSIS — Z885 Allergy status to narcotic agent status: Secondary | ICD-10-CM | POA: Diagnosis not present

## 2022-12-07 DIAGNOSIS — R197 Diarrhea, unspecified: Secondary | ICD-10-CM | POA: Diagnosis not present

## 2022-12-07 DIAGNOSIS — E871 Hypo-osmolality and hyponatremia: Secondary | ICD-10-CM | POA: Diagnosis not present

## 2022-12-07 DIAGNOSIS — I5032 Chronic diastolic (congestive) heart failure: Secondary | ICD-10-CM | POA: Diagnosis not present

## 2022-12-07 DIAGNOSIS — Z681 Body mass index (BMI) 19 or less, adult: Secondary | ICD-10-CM | POA: Diagnosis not present

## 2022-12-07 DIAGNOSIS — R112 Nausea with vomiting, unspecified: Secondary | ICD-10-CM | POA: Diagnosis not present

## 2022-12-07 DIAGNOSIS — E7800 Pure hypercholesterolemia, unspecified: Secondary | ICD-10-CM | POA: Diagnosis not present

## 2022-12-07 DIAGNOSIS — M810 Age-related osteoporosis without current pathological fracture: Secondary | ICD-10-CM | POA: Diagnosis present

## 2022-12-07 DIAGNOSIS — I4891 Unspecified atrial fibrillation: Secondary | ICD-10-CM | POA: Diagnosis not present

## 2022-12-07 DIAGNOSIS — M069 Rheumatoid arthritis, unspecified: Secondary | ICD-10-CM | POA: Diagnosis present

## 2022-12-07 DIAGNOSIS — R600 Localized edema: Secondary | ICD-10-CM | POA: Diagnosis not present

## 2022-12-07 DIAGNOSIS — I4819 Other persistent atrial fibrillation: Secondary | ICD-10-CM | POA: Diagnosis present

## 2022-12-07 DIAGNOSIS — K567 Ileus, unspecified: Secondary | ICD-10-CM | POA: Diagnosis not present

## 2022-12-07 DIAGNOSIS — R410 Disorientation, unspecified: Secondary | ICD-10-CM | POA: Diagnosis not present

## 2022-12-07 DIAGNOSIS — Z882 Allergy status to sulfonamides status: Secondary | ICD-10-CM | POA: Diagnosis not present

## 2022-12-07 DIAGNOSIS — A4189 Other specified sepsis: Secondary | ICD-10-CM | POA: Diagnosis present

## 2022-12-07 DIAGNOSIS — E440 Moderate protein-calorie malnutrition: Secondary | ICD-10-CM | POA: Diagnosis not present

## 2022-12-07 DIAGNOSIS — A419 Sepsis, unspecified organism: Secondary | ICD-10-CM | POA: Diagnosis not present

## 2022-12-07 DIAGNOSIS — Z79899 Other long term (current) drug therapy: Secondary | ICD-10-CM | POA: Diagnosis not present

## 2022-12-07 DIAGNOSIS — K219 Gastro-esophageal reflux disease without esophagitis: Secondary | ICD-10-CM | POA: Diagnosis present

## 2022-12-07 DIAGNOSIS — A084 Viral intestinal infection, unspecified: Secondary | ICD-10-CM | POA: Diagnosis present

## 2022-12-07 DIAGNOSIS — Z7982 Long term (current) use of aspirin: Secondary | ICD-10-CM | POA: Diagnosis not present

## 2022-12-07 DIAGNOSIS — Z825 Family history of asthma and other chronic lower respiratory diseases: Secondary | ICD-10-CM | POA: Diagnosis not present

## 2022-12-07 DIAGNOSIS — I5A Non-ischemic myocardial injury (non-traumatic): Secondary | ICD-10-CM | POA: Diagnosis present

## 2022-12-07 DIAGNOSIS — R000 Tachycardia, unspecified: Secondary | ICD-10-CM | POA: Diagnosis not present

## 2022-12-07 DIAGNOSIS — Z8249 Family history of ischemic heart disease and other diseases of the circulatory system: Secondary | ICD-10-CM | POA: Diagnosis not present

## 2022-12-07 DIAGNOSIS — Z881 Allergy status to other antibiotic agents status: Secondary | ICD-10-CM | POA: Diagnosis not present

## 2022-12-07 DIAGNOSIS — I700 Atherosclerosis of aorta: Secondary | ICD-10-CM | POA: Diagnosis not present

## 2022-12-07 DIAGNOSIS — I10 Essential (primary) hypertension: Secondary | ICD-10-CM | POA: Diagnosis not present

## 2022-12-07 DIAGNOSIS — K529 Noninfective gastroenteritis and colitis, unspecified: Secondary | ICD-10-CM | POA: Diagnosis not present

## 2022-12-07 DIAGNOSIS — R58 Hemorrhage, not elsewhere classified: Secondary | ICD-10-CM | POA: Diagnosis not present

## 2022-12-07 DIAGNOSIS — R109 Unspecified abdominal pain: Secondary | ICD-10-CM | POA: Diagnosis not present

## 2022-12-07 DIAGNOSIS — R0689 Other abnormalities of breathing: Secondary | ICD-10-CM | POA: Diagnosis not present

## 2022-12-07 LAB — SURGICAL PATHOLOGY

## 2022-12-07 NOTE — Telephone Encounter (Signed)
Patient's daughter, Melike Klomp, states she is with patient at [facility] to have her stitches removed and patient does not have her wallet with her.  Kendal Hymen states they will not see patient without having her insurance card.  Kendal Hymen states she will be coming by to pick up a copy of patient's insurance cards.  I let her know that I will leave them up front for her.

## 2022-12-07 NOTE — Telephone Encounter (Signed)
Noted  

## 2022-12-08 ENCOUNTER — Inpatient Hospital Stay
Admission: EM | Admit: 2022-12-08 | Discharge: 2022-12-09 | DRG: 872 | Discharge status: Against Medical Advice | Payer: Medicare Other | Attending: Internal Medicine | Admitting: Internal Medicine

## 2022-12-08 ENCOUNTER — Inpatient Hospital Stay: Payer: Medicare Other | Admitting: Internal Medicine

## 2022-12-08 ENCOUNTER — Other Ambulatory Visit: Payer: Self-pay

## 2022-12-08 ENCOUNTER — Emergency Department: Payer: Medicare Other

## 2022-12-08 DIAGNOSIS — A4189 Other specified sepsis: Principal | ICD-10-CM | POA: Diagnosis present

## 2022-12-08 DIAGNOSIS — Z825 Family history of asthma and other chronic lower respiratory diseases: Secondary | ICD-10-CM

## 2022-12-08 DIAGNOSIS — Z66 Do not resuscitate: Secondary | ICD-10-CM | POA: Diagnosis present

## 2022-12-08 DIAGNOSIS — K529 Noninfective gastroenteritis and colitis, unspecified: Principal | ICD-10-CM | POA: Diagnosis present

## 2022-12-08 DIAGNOSIS — M1990 Unspecified osteoarthritis, unspecified site: Secondary | ICD-10-CM | POA: Diagnosis present

## 2022-12-08 DIAGNOSIS — Z888 Allergy status to other drugs, medicaments and biological substances status: Secondary | ICD-10-CM

## 2022-12-08 DIAGNOSIS — R000 Tachycardia, unspecified: Secondary | ICD-10-CM | POA: Diagnosis not present

## 2022-12-08 DIAGNOSIS — Z681 Body mass index (BMI) 19 or less, adult: Secondary | ICD-10-CM | POA: Diagnosis not present

## 2022-12-08 DIAGNOSIS — E440 Moderate protein-calorie malnutrition: Secondary | ICD-10-CM | POA: Diagnosis present

## 2022-12-08 DIAGNOSIS — I700 Atherosclerosis of aorta: Secondary | ICD-10-CM | POA: Diagnosis not present

## 2022-12-08 DIAGNOSIS — I110 Hypertensive heart disease with heart failure: Secondary | ICD-10-CM | POA: Diagnosis present

## 2022-12-08 DIAGNOSIS — A084 Viral intestinal infection, unspecified: Secondary | ICD-10-CM | POA: Diagnosis present

## 2022-12-08 DIAGNOSIS — I4819 Other persistent atrial fibrillation: Secondary | ICD-10-CM | POA: Diagnosis present

## 2022-12-08 DIAGNOSIS — Z801 Family history of malignant neoplasm of trachea, bronchus and lung: Secondary | ICD-10-CM

## 2022-12-08 DIAGNOSIS — Z885 Allergy status to narcotic agent status: Secondary | ICD-10-CM

## 2022-12-08 DIAGNOSIS — I5032 Chronic diastolic (congestive) heart failure: Secondary | ICD-10-CM | POA: Diagnosis present

## 2022-12-08 DIAGNOSIS — Z79899 Other long term (current) drug therapy: Secondary | ICD-10-CM

## 2022-12-08 DIAGNOSIS — Z8249 Family history of ischemic heart disease and other diseases of the circulatory system: Secondary | ICD-10-CM

## 2022-12-08 DIAGNOSIS — E871 Hypo-osmolality and hyponatremia: Secondary | ICD-10-CM | POA: Diagnosis present

## 2022-12-08 DIAGNOSIS — M069 Rheumatoid arthritis, unspecified: Secondary | ICD-10-CM | POA: Diagnosis present

## 2022-12-08 DIAGNOSIS — I10 Essential (primary) hypertension: Secondary | ICD-10-CM | POA: Diagnosis present

## 2022-12-08 DIAGNOSIS — Z803 Family history of malignant neoplasm of breast: Secondary | ICD-10-CM

## 2022-12-08 DIAGNOSIS — K567 Ileus, unspecified: Secondary | ICD-10-CM | POA: Diagnosis present

## 2022-12-08 DIAGNOSIS — E7800 Pure hypercholesterolemia, unspecified: Secondary | ICD-10-CM | POA: Diagnosis present

## 2022-12-08 DIAGNOSIS — R197 Diarrhea, unspecified: Secondary | ICD-10-CM

## 2022-12-08 DIAGNOSIS — R410 Disorientation, unspecified: Secondary | ICD-10-CM | POA: Diagnosis not present

## 2022-12-08 DIAGNOSIS — Z881 Allergy status to other antibiotic agents status: Secondary | ICD-10-CM

## 2022-12-08 DIAGNOSIS — R109 Unspecified abdominal pain: Secondary | ICD-10-CM | POA: Diagnosis not present

## 2022-12-08 DIAGNOSIS — Z880 Allergy status to penicillin: Secondary | ICD-10-CM

## 2022-12-08 DIAGNOSIS — Z9049 Acquired absence of other specified parts of digestive tract: Secondary | ICD-10-CM

## 2022-12-08 DIAGNOSIS — K219 Gastro-esophageal reflux disease without esophagitis: Secondary | ICD-10-CM | POA: Diagnosis present

## 2022-12-08 DIAGNOSIS — Z7982 Long term (current) use of aspirin: Secondary | ICD-10-CM | POA: Diagnosis not present

## 2022-12-08 DIAGNOSIS — I4891 Unspecified atrial fibrillation: Secondary | ICD-10-CM | POA: Diagnosis not present

## 2022-12-08 DIAGNOSIS — Z5329 Procedure and treatment not carried out because of patient's decision for other reasons: Secondary | ICD-10-CM | POA: Diagnosis present

## 2022-12-08 DIAGNOSIS — I5A Non-ischemic myocardial injury (non-traumatic): Secondary | ICD-10-CM | POA: Diagnosis present

## 2022-12-08 DIAGNOSIS — M810 Age-related osteoporosis without current pathological fracture: Secondary | ICD-10-CM | POA: Diagnosis present

## 2022-12-08 DIAGNOSIS — A419 Sepsis, unspecified organism: Secondary | ICD-10-CM | POA: Diagnosis present

## 2022-12-08 DIAGNOSIS — R0689 Other abnormalities of breathing: Secondary | ICD-10-CM | POA: Diagnosis not present

## 2022-12-08 DIAGNOSIS — R112 Nausea with vomiting, unspecified: Secondary | ICD-10-CM | POA: Diagnosis present

## 2022-12-08 DIAGNOSIS — I38 Endocarditis, valve unspecified: Secondary | ICD-10-CM | POA: Diagnosis present

## 2022-12-08 DIAGNOSIS — Z882 Allergy status to sulfonamides status: Secondary | ICD-10-CM | POA: Diagnosis not present

## 2022-12-08 DIAGNOSIS — E785 Hyperlipidemia, unspecified: Secondary | ICD-10-CM | POA: Diagnosis present

## 2022-12-08 DIAGNOSIS — R58 Hemorrhage, not elsewhere classified: Secondary | ICD-10-CM | POA: Diagnosis not present

## 2022-12-08 DIAGNOSIS — M25561 Pain in right knee: Secondary | ICD-10-CM | POA: Diagnosis present

## 2022-12-08 LAB — PROTIME-INR
INR: 1.1 (ref 0.8–1.2)
Prothrombin Time: 14 seconds (ref 11.4–15.2)

## 2022-12-08 LAB — APTT: aPTT: 24 seconds (ref 24–36)

## 2022-12-08 LAB — COMPREHENSIVE METABOLIC PANEL
ALT: 27 U/L (ref 0–44)
AST: 33 U/L (ref 15–41)
Albumin: 3.4 g/dL — ABNORMAL LOW (ref 3.5–5.0)
Alkaline Phosphatase: 76 U/L (ref 38–126)
Anion gap: 12 (ref 5–15)
BUN: 26 mg/dL — ABNORMAL HIGH (ref 8–23)
CO2: 25 mmol/L (ref 22–32)
Calcium: 8.6 mg/dL — ABNORMAL LOW (ref 8.9–10.3)
Chloride: 93 mmol/L — ABNORMAL LOW (ref 98–111)
Creatinine, Ser: 0.75 mg/dL (ref 0.44–1.00)
GFR, Estimated: >60 mL/min (ref >60)
Glucose, Bld: 157 mg/dL — ABNORMAL HIGH (ref 70–99)
Potassium: 3.9 mmol/L (ref 3.5–5.1)
Sodium: 130 mmol/L — ABNORMAL LOW (ref 135–145)
Total Bilirubin: 1 mg/dL (ref 0.3–1.2)
Total Protein: 7.9 g/dL (ref 6.5–8.1)

## 2022-12-08 LAB — CBC
HCT: 40.2 % (ref 36.0–46.0)
Hemoglobin: 13.7 g/dL (ref 12.0–15.0)
MCH: 31.4 pg (ref 26.0–34.0)
MCHC: 34.1 g/dL (ref 30.0–36.0)
MCV: 92 fL (ref 80.0–100.0)
Platelets: 520 10*3/uL — ABNORMAL HIGH (ref 150–400)
RBC: 4.37 MIL/uL (ref 3.87–5.11)
RDW: 12.4 % (ref 11.5–15.5)
WBC: 23.9 10*3/uL — ABNORMAL HIGH (ref 4.0–10.5)
nRBC: 0 % (ref 0.0–0.2)

## 2022-12-08 LAB — TYPE AND SCREEN
ABO/RH(D): B POS
Antibody Screen: NEGATIVE

## 2022-12-08 LAB — TROPONIN I (HIGH SENSITIVITY)
Troponin I (High Sensitivity): 23 ng/L — ABNORMAL HIGH (ref <18)
Troponin I (High Sensitivity): 41 ng/L — ABNORMAL HIGH (ref <18)
Troponin I (High Sensitivity): 49 ng/L — ABNORMAL HIGH (ref <18)

## 2022-12-08 LAB — BRAIN NATRIURETIC PEPTIDE: B Natriuretic Peptide: 469.6 pg/mL — ABNORMAL HIGH (ref 0.0–100.0)

## 2022-12-08 LAB — LACTIC ACID, PLASMA: Lactic Acid, Venous: 1.9 mmol/L (ref 0.5–1.9)

## 2022-12-08 MED ORDER — DILTIAZEM HCL-DEXTROSE 125-5 MG/125ML-% IV SOLN (PREMIX)
5.0000 mg/h | INTRAVENOUS | Status: DC
  Administered 2022-12-08: 5 mg/h via INTRAVENOUS
  Filled 2022-12-08 (×2): qty 125

## 2022-12-08 MED ORDER — SODIUM CHLORIDE 0.9 % IV SOLN
2.0000 g | INTRAVENOUS | Status: DC
  Administered 2022-12-09: 2 g via INTRAVENOUS
  Filled 2022-12-08: qty 20

## 2022-12-08 MED ORDER — PREDNISONE 10 MG PO TABS
10.0000 mg | ORAL_TABLET | Freq: Every day | ORAL | Status: DC
  Administered 2022-12-09: 10 mg via ORAL
  Filled 2022-12-08: qty 1

## 2022-12-08 MED ORDER — DILTIAZEM HCL 25 MG/5ML IV SOLN
10.0000 mg | Freq: Once | INTRAVENOUS | Status: DC
  Filled 2022-12-08: qty 5

## 2022-12-08 MED ORDER — SODIUM CHLORIDE 1 G PO TABS
1.0000 g | ORAL_TABLET | Freq: Three times a day (TID) | ORAL | Status: DC
  Administered 2022-12-08 – 2022-12-09 (×3): 1 g via ORAL
  Filled 2022-12-08 (×3): qty 1

## 2022-12-08 MED ORDER — PREDNISONE 10 MG PO TABS: 5.0000 mg | ORAL_TABLET | Freq: Every day | ORAL | Status: DC

## 2022-12-08 MED ORDER — TRAMADOL HCL 50 MG PO TABS: 50.0000 mg | ORAL_TABLET | Freq: Four times a day (QID) | ORAL | Status: DC | PRN

## 2022-12-08 MED ORDER — SODIUM CHLORIDE 0.9 % IV BOLUS
500.0000 mL | Freq: Once | INTRAVENOUS | Status: AC
  Administered 2022-12-08: 500 mL via INTRAVENOUS

## 2022-12-08 MED ORDER — ONDANSETRON HCL 4 MG/2ML IJ SOLN
4.0000 mg | Freq: Three times a day (TID) | INTRAMUSCULAR | Status: DC | PRN
  Administered 2022-12-09: 4 mg via INTRAVENOUS
  Filled 2022-12-08: qty 2

## 2022-12-08 MED ORDER — FENTANYL CITRATE PF 50 MCG/ML IJ SOSY: 12.5000 ug | PREFILLED_SYRINGE | INTRAMUSCULAR | Status: DC | PRN

## 2022-12-08 MED ORDER — METRONIDAZOLE 500 MG/100ML IV SOLN
500.0000 mg | Freq: Two times a day (BID) | INTRAVENOUS | Status: DC
  Administered 2022-12-08 – 2022-12-09 (×2): 500 mg via INTRAVENOUS
  Filled 2022-12-08 (×2): qty 100

## 2022-12-08 MED ORDER — ENSURE ENLIVE PO LIQD
237.0000 mL | Freq: Two times a day (BID) | ORAL | Status: DC
  Administered 2022-12-09: 237 mL via ORAL

## 2022-12-08 MED ORDER — DILTIAZEM HCL ER COATED BEADS 180 MG PO CP24
180.0000 mg | ORAL_CAPSULE | Freq: Every day | ORAL | Status: DC
  Administered 2022-12-09: 180 mg via ORAL
  Filled 2022-12-08: qty 1

## 2022-12-08 MED ORDER — ADULT MULTIVITAMIN W/MINERALS CH
1.0000 | ORAL_TABLET | Freq: Every day | ORAL | Status: DC
  Administered 2022-12-09: 1 via ORAL
  Filled 2022-12-08: qty 1

## 2022-12-08 MED ORDER — ONDANSETRON HCL 4 MG/2ML IJ SOLN
4.0000 mg | Freq: Once | INTRAMUSCULAR | Status: AC
  Administered 2022-12-08: 4 mg via INTRAVENOUS
  Filled 2022-12-08: qty 2

## 2022-12-08 MED ORDER — PANTOPRAZOLE SODIUM 40 MG PO TBEC
40.0000 mg | DELAYED_RELEASE_TABLET | Freq: Every day | ORAL | Status: DC
  Administered 2022-12-08 – 2022-12-09 (×2): 40 mg via ORAL
  Filled 2022-12-08 (×2): qty 1

## 2022-12-08 MED ORDER — METRONIDAZOLE 500 MG/100ML IV SOLN
500.0000 mg | Freq: Once | INTRAVENOUS | Status: DC
  Filled 2022-12-08: qty 100

## 2022-12-08 MED ORDER — ACETAMINOPHEN 325 MG PO TABS: 650.0000 mg | ORAL_TABLET | Freq: Four times a day (QID) | ORAL | Status: DC | PRN

## 2022-12-08 MED ORDER — ALPRAZOLAM 0.5 MG PO TABS: 0.2500 mg | ORAL_TABLET | Freq: Two times a day (BID) | ORAL | Status: DC | PRN

## 2022-12-08 MED ORDER — COLESTIPOL HCL 1 G PO TABS
2.0000 g | ORAL_TABLET | Freq: Every day | ORAL | Status: DC
  Administered 2022-12-08 – 2022-12-09 (×2): 2 g via ORAL
  Filled 2022-12-08 (×2): qty 2

## 2022-12-08 MED ORDER — FLUTICASONE PROPIONATE 50 MCG/ACT NA SUSP
2.0000 | Freq: Every day | NASAL | Status: DC
  Administered 2022-12-09: 2 via NASAL
  Filled 2022-12-08: qty 16

## 2022-12-08 MED ORDER — ACETAMINOPHEN 325 MG PO TABS: 325.0000 mg | ORAL_TABLET | Freq: Four times a day (QID) | ORAL | Status: DC | PRN

## 2022-12-08 MED ORDER — ALBUTEROL SULFATE (2.5 MG/3ML) 0.083% IN NEBU: 3.0000 mL | INHALATION_SOLUTION | RESPIRATORY_TRACT | Status: DC | PRN

## 2022-12-08 MED ORDER — SODIUM CHLORIDE 0.9 % IV SOLN
2.0000 g | Freq: Once | INTRAVENOUS | Status: AC
  Administered 2022-12-08: 2 g via INTRAVENOUS
  Filled 2022-12-08: qty 20

## 2022-12-08 MED ORDER — HYDRALAZINE HCL 20 MG/ML IJ SOLN: 5.0000 mg | INTRAMUSCULAR | Status: DC | PRN

## 2022-12-08 MED ORDER — ATORVASTATIN CALCIUM 20 MG PO TABS
20.0000 mg | ORAL_TABLET | Freq: Every day | ORAL | Status: DC
  Administered 2022-12-08 – 2022-12-09 (×2): 20 mg via ORAL
  Filled 2022-12-08 (×2): qty 1

## 2022-12-08 MED ORDER — GABAPENTIN 100 MG PO CAPS
100.0000 mg | ORAL_CAPSULE | Freq: Every day | ORAL | Status: DC
  Administered 2022-12-08 – 2022-12-09 (×2): 100 mg via ORAL
  Filled 2022-12-08 (×2): qty 1

## 2022-12-08 MED ORDER — LORATADINE 10 MG PO TABS
10.0000 mg | ORAL_TABLET | Freq: Every day | ORAL | Status: DC
  Administered 2022-12-09: 10 mg via ORAL
  Filled 2022-12-08: qty 1

## 2022-12-08 MED ORDER — IOHEXOL 300 MG/ML  SOLN
100.0000 mL | Freq: Once | INTRAMUSCULAR | Status: AC | PRN
  Administered 2022-12-08: 100 mL via INTRAVENOUS

## 2022-12-08 MED ORDER — NITROGLYCERIN 0.4 MG SL SUBL: 0.4000 mg | SUBLINGUAL_TABLET | SUBLINGUAL | Status: DC | PRN

## 2022-12-08 MED ORDER — SODIUM CHLORIDE 0.9 % IV SOLN: INTRAVENOUS | Status: DC

## 2022-12-08 MED ORDER — RISAQUAD PO CAPS
1.0000 | ORAL_CAPSULE | Freq: Every day | ORAL | Status: DC
  Administered 2022-12-09: 1 via ORAL
  Filled 2022-12-08: qty 1

## 2022-12-08 NOTE — ED Triage Notes (Signed)
Arrives from home via [facility].  C/O rectal bleeding.  First noticed this morning at 0400.  Recently discharged from [facility] after right knee surgery.  Taking antibiotics post op.  Hx Afib.  HR initially 140 per report.  1L NS given.  HR down to 120.  Recently stopped taking Cardizem  98.3.  Also c/o N/V since discharge from hosptial 4 days ago.  18 g RFA.  4 mg Zofran given PTA.

## 2022-12-08 NOTE — H&P (Addendum)
History and Physical    Kaitlyn Huff:[id number] DOB: December 17, 1940 DOA: 12/08/2022  Referring MD/NP/PA:   PCP: Dale Maish Vaya, MD   Patient coming from:  The patient is coming from home.     Chief Complaint: Nausea, vomiting, blood in stool  HPI: Kaitlyn Huff is a 82 y.o. female with medical history significant of HTN, HLD, dCHF, GERD, RA, diverticulitis, A fib not on anticoagulants, right knee pain, who presents with nausea vomiting and bloody stool.  Patient was recently hospitalized from 5/21 - 5/26 due to acute right knee pain. Per discharge summary, there was no organisms or crystals seen on aspiration, with 38,335 white blood cells on aspiration, and cultures did not show any organism.  Patient was discharged home on Keflex.  Patient finished a course of Keflex.  Per her daughter, patient had constipation in the past several days, then started having nausea, vomiting, with several episodes of blood in stool.  The color of blood in stool is bright red and also dark.  Patient reports central abdominal pain, which is constant, aching, mild to moderate, nonradiating.  Denies fever or chills.  Patient does not have chest pain, cough, shortness of breath.  Patient reports heart racing.  Per report, her heart rate was up to 140s, then improved to 70s, coming back to 120s in ED. denies symptoms of UTI.  Data reviewed independently and ED Course: pt was found to have WBC 23.9, GFR> 60, sodium 130, potassium 3.9, temperature 97.5, blood pressure 141/92, RR 16, oxygen saturation 98% on room air.  Patient is admitted to PCU as inpatient.  CT abdomen/pelvis: 1. Focal small bowel wall thickening with associated mesenteric edema, focal hyperemia and upstream dilation of the proximal small bowel. Findings favored to be due to infectious enteritis with associated ileus. Less likely ischemic or inflammatory enteritis, recommend correlation with serum lactate. Associated small bowel obstruction  also a consideration, but less likely given gradual transition. 2. Small volume abdominopelvic ascites. 3. Aortic Atherosclerosis (ICD10-I70.0).   EKG: I have personally reviewed.  A-fib, QTc 392, early R wave progression, heart rate of 112   Review of Systems:   General: no fevers, chills, no body weight gain, has poor appetite, has fatigue HEENT: no blurry vision, hearing changes or sore throat Respiratory: no dyspnea, coughing, wheezing CV: no chest pain, has palpitations GI: has nausea, vomiting, abdominal pain, blood in stool GU: no dysuria, burning on urination, increased urinary frequency, hematuria  Ext: has trace leg edema Neuro: no unilateral weakness, numbness, or tingling, no vision change or hearing loss Skin: no rash, no skin tear. MSK: No muscle spasm, no deformity, no limitation of range of movement in spin Heme: No easy bruising.  Travel history: No recent long distant travel.   Allergy:  Allergies  Allergen Reactions   Astelin [Azelastine Hcl] Other (See Comments)    Reaction:  Unknown    Codeine Other (See Comments)    Reaction:  Altered mental status   Dilaudid [Hydromorphone] Other (See Comments)    Reaction:  Unknown    Flexeril [Cyclobenzaprine] Other (See Comments)    Reaction:  Unknown    Imuran [Azathioprine] Other (See Comments)    Reaction:  Abnormal liver function   Lisinopril Itching   Lyrica [Pregabalin] Other (See Comments)    Reaction:  Sore gums    Methotrexate Derivatives Other (See Comments)    Reaction:  Abnormal liver function   Amiodarone Anxiety and Other (See Comments)    Pt could not  eat or sleep. Caused n/v and "messed her up"   Amoxicillin Rash and Other (See Comments)    Patient has tolerated multiple cepalosporins Unable to obtain enough information to answer additional questions about this medication.     Arava [Leflunomide] Rash   Clindamycin/Lincomycin Rash   Doxycycline Rash   Lodine [Etodolac] Rash   Percocet  [Oxycodone-Acetaminophen] Rash   Sulfa Antibiotics Rash    Past Medical History:  Diagnosis Date   Anemia    Cancer (HCC)    Diverticulitis    GERD (gastroesophageal reflux disease)    Hyperlipidemia    Hypertension    Osteoarthritis    Osteoporosis    actonel   Positive PPD    s/p INH (2006)   Rheumatoid arthritis(714.0)    MTX transaminitis, Leflunomide (rash), enbrel, plaquinil, prednisone, remicade, Imuran (transaminitis)   Valvular heart disease    moderate MR and TR    Past Surgical History:  Procedure Laterality Date   ABDOMINAL HYSTERECTOMY     CERVICAL CONE BIOPSY     CHOLECYSTECTOMY  06/22/14   COLONOSCOPY WITH PROPOFOL N/A 07/09/2015   Procedure: COLONOSCOPY WITH PROPOFOL;  Surgeon: Scot Jun, MD;  [facility] ENDOSCOPY;  Service: Endoscopy;  Laterality: N/A;   ESOPHAGOGASTRODUODENOSCOPY (EGD) WITH PROPOFOL N/A 07/09/2015   Procedure: ESOPHAGOGASTRODUODENOSCOPY (EGD) WITH PROPOFOL;  Surgeon: Scot Jun, MD;  Location[facility] END[facility]COPY;  Service: Endoscopy;  Laterality: N/A;   IRRIGATION AND DEBRIDEMENT KNEE Right 11/30/2022   Procedure: RIGHT KNEE ARTHROSCOPIC WASHOUT;  Surgeon: Juanell Fairly, MD;  Location: [facility] ORS;  Service: Orthopedics;  Laterality: Right;   OVARY SURGERY     TRACHEOSTOMY  1959   TUBAL LIGATION      Social History:  reports that she has never smoked. She has never used smokeless tobacco. She reports that she does not drink alcohol and does not use drugs.  Family History:  Family History  Problem Relation Age of Onset   Heart disease Father        MI   Heart disease Mother    Valvular heart disease Mother    Breast cancer Sister 4   Cancer Sister        Lung cancer   COPD Sister    Heart disease Sister    Colon cancer Neg Hx      Prior to Admission medications   Medication Sig Start Date End Date Taking? Authorizing Provider  albuterol (VENTOLIN HFA) 108 (90 Base) MCG/ACT inhaler USE 2 INHALATIONS EVERY 6 HOURS  AS NEEDED FOR WHEEZING OR SHORTNESS OF BREATH Patient taking differently: Inhale 2 puffs into the lungs every 6 (six) hours as needed for wheezing or shortness of breath. USE 2 INHALATIONS EVERY 6 HOURS AS NEEDED FOR WHEEZING OR SHORTNESS OF BREATH 07/12/22  Yes Dale Oakwood, MD  ALPRAZolam Prudy Feeler) 0.25 MG tablet Take 1 tablet (0.25 mg total) by mouth 2 (two) times daily as needed for anxiety. 04/05/19  Yes Enid Baas, MD  aspirin EC 81 MG tablet Take 1 tablet (81 mg total) by mouth daily. Swallow whole. 12/05/22  Yes Wieting, Richard, MD  atorvastatin (LIPITOR) 20 MG tablet Take 20 mg by mouth daily. 09/26/22  Yes [provider]  cephALEXin (KEFLEX) 500 MG capsule Take 1 capsule (500 mg total) by mouth 4 (four) times daily for 4 days. 12/04/22 12/08/22 Yes Wieting, Richard, MD  cetirizine (ZYRTEC) 10 MG tablet Take 1 tablet (10 mg total) by mouth daily. 11/08/22  Yes Dale Falcon, MD  colestipol (COLESTID)  1 g tablet Take 2 g by mouth daily.   Yes [provider]  diltiazem (CARDIZEM CD) 180 MG 24 hr capsule Take 180 mg by mouth daily. 06/14/21  Yes [provider]  fluticasone (FLONASE) 50 MCG/ACT nasal spray USE 2 SPRAYS IN EACH NOSTRIL DAILY (CALL OFFICE SOON TO RESCHEDULE YOUR PHYSICAL) Patient taking differently: Place 2 sprays into both nostrils daily. USE 2 SPRAYS IN EACH NOSTRIL DAILY (CALL OFFICE SOON TO RESCHEDULE YOUR PHYSICAL) 06/22/22  Yes Dale Whiteside, MD  furosemide (LASIX) 20 MG tablet Take 1 tablet (20 mg total) by mouth daily. 12/05/22  Yes Wieting, Richard, MD  gabapentin (NEURONTIN) 100 MG capsule Take 100 mg by mouth daily. 01/14/20  Yes [provider]  Multiple Vitamin (MULTIVITAMIN WITH MINERALS) TABS tablet Take 1 tablet by mouth daily.   Yes [provider]  nitroGLYCERIN (NITROSTAT) 0.4 MG SL tablet Place 1 tablet (0.4 mg total) under the tongue every 5 (five) minutes as needed for chest pain (maximum 3). 12/04/22  Yes  Wieting, Richard, MD  omeprazole (PRILOSEC) 20 MG capsule Take 1 capsule by mouth daily. 10/20/20  Yes [provider]  polyethylene glycol (MIRALAX / GLYCOLAX) 17 g packet Take 17 g by mouth daily as needed for moderate constipation. 12/04/22  Yes Wieting, Richard, MD  predniSONE (DELTASONE) 10 MG tablet 4 tabs po day 1; 3 tabs po day 2; 2 tabs po day3,4; 1 tab po day5,6; 1/2 tab po day 7,8 12/04/22  Yes Wieting, Richard, MD  Probiotic Product (ALIGN PO) Take 1 tablet by mouth daily.   Yes [provider]  sodium chloride 1 g tablet Take 1 tablet (1 g total) by mouth 3 (three) times daily with meals. 12/04/22  Yes Wieting, Richard, MD  spironolactone (ALDACTONE) 25 MG tablet Take 12.5 mg by mouth daily. 08/05/20  Yes [provider]  traMADol (ULTRAM) 50 MG tablet Take 1 tablet (50 mg total) by mouth every 6 (six) hours as needed for moderate pain. 12/04/22  Yes Wieting, Richard, MD  feeding supplement (ENSURE ENLIVE / ENSURE PLUS) LIQD Take 237 mLs by mouth 2 (two) times daily between meals. 12/04/22   Alford Highland, MD  lip balm (BLISTEX) OINT Apply 1 application topically as needed for lip care. Patient not taking: Reported on 12/08/2022 06/21/16   Alford Highland, MD    Physical Exam: Vitals:   12/08/22 1400 12/08/22 1430 12/08/22 1500 12/08/22 1530  BP: (!) 141/92 (!) 166/88 (!) 153/95 (!) 152/90  Pulse: 73 (!) 51 91 (!) 122  Resp: 13 17 20  (!) 21  Temp:      TempSrc:      SpO2: 98% 90% 92% 96%  Weight:      Height:       General: Not in acute distress HEENT:       Eyes: PERRL, EOMI, no scleral icterus.       ENT: No discharge from the ears and nose, no pharynx injection, no tonsillar enlargement.        Neck: No JVD, no bruit, no mass felt. Heme: No neck lymph node enlargement. Cardiac: S1/S2, irregularly irregular rhythm, No gallops or rubs. Respiratory: No rales, wheezing, rhonchi or rubs. GI: Soft, nondistended, has tenderness in central abdomen, no  rebound pain, no organomegaly, BS present. GU: No hematuria Ext: has trace leg edema bilaterally. 1+DP/PT pulse bilaterally. Musculoskeletal: No joint deformities, No joint redness or warmth, no limitation of ROM in spin. Skin: No rashes.  Neuro: Alert, oriented X3,  cranial nerves II-XII grossly intact, moves all extremities normally.  Psych: Patient is not psychotic, no suicidal or hemocidal ideation.  Labs on Admission: I have personally reviewed following labs and imaging studies  CBC: Recent Labs  Lab 12/02/22 0753 12/03/22 0739 12/08/22 1106  WBC 12.5* 15.4* 23.9*  HGB 11.7* 10.0* 13.7  HCT 33.5* 28.3* 40.2  MCV 91.0 90.1 92.0  PLT 245 251 520*   Basic Metabolic Panel: Recent Labs  Lab 12/02/22 0542 12/03/22 0737 12/04/22 0507 12/08/22 1106  NA 125* 123* 124* 130*  K 4.1 4.4 4.5 3.9  CL 94* 91* 92* 93*  CO2 23 23 26 25   GLUCOSE 129* 148* 137* 157*  BUN 27* 35* 39* 26*  CREATININE 0.91 0.95 0.79 0.75  CALCIUM 8.2* 8.4* 8.4* 8.6*   GFR: Estimated Creatinine Clearance: 49 mL/min (by C-G formula based on SCr of 0.75 mg/dL). Liver Function Tests: Recent Labs  Lab 12/08/22 1106  AST 33  ALT 27  ALKPHOS 76  BILITOT 1.0  PROT 7.9  ALBUMIN 3.4*   No results for input(s): "LIPASE", "AMYLASE" in the last 168 hours. No results for input(s): "AMMONIA" in the last 168 hours. Coagulation Profile: No results for input(s): "INR", "PROTIME" in the last 168 hours. Cardiac Enzymes: No results for input(s): "CKTOTAL", "CKMB", "CKMBINDEX", "TROPONINI" in the last 168 hours. BNP (last 3 results) No results for input(s): "PROBNP" in the last 8760 hours. HbA1C: No results for input(s): "HGBA1C" in the last 72 hours. CBG: No results for input(s): "GLUCAP" in the last 168 hours. Lipid Profile: No results for input(s): "CHOL", "HDL", "LDLCALC", "TRIG", "CHOLHDL", "LDLDIRECT" in the last 72 hours. Thyroid Function Tests: No results for input(s): "TSH", "T4TOTAL", "FREET4",  "T3FREE", "THYROIDAB" in the last 72 hours. Anemia Panel: No results for input(s): "VITAMINB12", "FOLATE", "FERRITIN", "TIBC", "IRON", "RETICCTPCT" in the last 72 hours. Urine analysis:    Component Value Date/Time   COLORURINE YELLOW 03/28/2022 1512   APPEARANCEUR CLEAR 03/28/2022 1512   APPEARANCEUR Clear 11/02/2014 0225   LABSPEC <=1.005 (A) 03/28/2022 1512   LABSPEC 1.010 11/02/2014 0225   PHURINE 7.0 03/28/2022 1512   GLUCOSEU NEGATIVE 03/28/2022 1512   HGBUR NEGATIVE 03/28/2022 1512   BILIRUBINUR NEGATIVE 03/28/2022 1512   BILIRUBINUR Negative 11/02/2014 0225   KETONESUR NEGATIVE 03/28/2022 1512   PROTEINUR NEGATIVE 02/27/2019 0355   UROBILINOGEN 0.2 03/28/2022 1512   NITRITE NEGATIVE 03/28/2022 1512   LEUKOCYTESUR NEGATIVE 03/28/2022 1512   LEUKOCYTESUR Negative 11/02/2014 0225   Sepsis Labs: @LABRCNTIP (procalcitonin:4,lacticidven:4) ) Recent Results (from the past 240 hour(s))  Body fluid culture w Gram Stain     Status: None   Collection Time: 11/29/22  5:36 PM   Specimen: Synovium; Body Fluid  Result Value Ref Range Status   Specimen Description   Final    SYNOVIAL KNEE JOINT Performed at [facility], [address] [place], [place] [address]    Special Requests   Final    SYNOVIAL Performed at [facility], [address] [place], [place] [address]    Gram Stain   Final    MODERATE WBC PRESENT,BOTH PMN AND MONONUCLEAR NO ORGANISMS SEEN    Culture   Final    NO GROWTH 3 DAYS Performed at [facility], [address] [place], [place] [address]    Report Status 12/03/2022 FINAL  Final  Culture, blood (routine x 2)     Status: None   Collection Time: 11/29/22 10:11 PM   Specimen: BLOOD  Result Value  Ref Range Status   Specimen Description BLOOD  LEFT AC  Final   Special Requests   Final    BOTTLES DRAWN AEROBIC AND ANAEROBIC Blood Culture adequate volume   Culture   Final    NO GROWTH 5 DAYS Performed at [facility], [address] [place], [place] [address]    Report Status 12/04/2022 FINAL  Final  Culture, blood (routine x 2)     Status: None   Collection Time: 11/29/22 10:11 PM   Specimen: BLOOD  Result Value Ref Range Status   Specimen Description BLOOD  RIGHT HAND  Final   Special Requests   Final    BOTTLES DRAWN AEROBIC AND ANAEROBIC Blood Culture adequate volume   Culture   Final    NO GROWTH 5 DAYS Performed at [facility], [address] [place], [place] [address]    Report Status 12/04/2022 FINAL  Final  Body fluid culture w Gram Stain     Status: None   Collection Time: 11/30/22  2:52 PM   Specimen: Synovium  Result Value Ref Range Status   Specimen Description SYNOVIAL  Final   Special Requests RIGHT KNEE  Final   Gram Stain   Final    ABUNDANT WBC PRESENT, PREDOMINANTLY PMN NO ORGANISMS SEEN    Culture   Final    NO GROWTH 3 DAYS Performed at [facility], [address], [place], [place] [address]    Report Status 12/03/2022 FINAL  Final  Gram stain     Status: None   Collection Time: 11/30/22  3:13 PM   Specimen: Path fluid; Body Fluid  Result Value Ref Range Status   Specimen Description SYNOVIAL  Final   Special Requests RIGHT KNEE  Final   Gram Stain   Final    FEW WBC SEEN RARE RBC SEEN NO ORGANISMS SEEN CALLED TO DR. KRAZINSKI 11/30/22 @ 1539 BY SB Performed at [facility], [address] [place], [place] [address]    Report Status 12/01/2022 FINAL  Final     Radiological Exams on Admission: CT ABDOMEN PELVIS W CONTRAST  Result Date: 12/08/2022 CLINICAL DATA:  Abdominal pain EXAM: CT ABDOMEN AND PELVIS WITH CONTRAST TECHNIQUE: Multidetector CT imaging of the abdomen and pelvis was performed using the standard protocol following bolus administration of intravenous contrast. RADIATION DOSE REDUCTION: This exam was performed according to the departmental dose-optimization program which includes automated exposure control,  adjustment of the mA and/or kV according to patient size and/or use of iterative reconstruction technique. CONTRAST:  OMNIPAQUE IOHEXOL 300 MG/ML  SOLN COMPARISON:  CT abdomen and pelvis dated September 07, 2018 FINDINGS: Lower chest: Cardiomegaly. Moderate hiatal hernia. No acute abnormality. Hepatobiliary: No focal liver abnormality is seen. Status post cholecystectomy. Prominent common bile duct, unchanged when compared with the prior exam. Pancreas: Unremarkable. No pancreatic ductal dilatation or surrounding inflammatory changes. Spleen: Normal in size without focal abnormality. Adrenals/Urinary Tract: Bilateral adrenal glands are unremarkable. No hydronephrosis or nephrolithiasis. Bladder is unremarkable. Stomach/Bowel: Multiple dilated loops of proximal small bowel and decompressed distal small bowel loops with gradual transition seen in the mid abdomen associated with area of small bowel fecalization, wall thickening, mesenteric edema. Area of focal hyperemia involving the small bowel (series 2, image 47) is also seen at the area of transition. No evidence of pneumatosis. Diverticulosis. Vascular/Lymphatic: Aortic atherosclerosis. SMV and SMA are patent. No enlarged abdominal or pelvic lymph nodes. Reproductive: Status post hysterectomy. No adnexal masses. Other:  No abdominal wall hernia or abnormality. Small volume abdominopelvic ascites. Musculoskeletal: Levocurvature of the lumbar spine. Moderate degenerative disc disease of the lumbar spine. No aggressive appearing osseous lesions. IMPRESSION: 1. Focal small bowel wall thickening with associated mesenteric edema, focal hyperemia and upstream dilation of the proximal small bowel. Findings favored to be due to infectious enteritis with associated ileus. Less likely ischemic or inflammatory enteritis, recommend correlation with serum lactate. Associated small bowel obstruction also a consideration, but less likely given gradual transition. 2. Small  volume abdominopelvic ascites. 3. Aortic Atherosclerosis (ICD10-I70.0). Electronically Signed   By: Allegra Lai M.D.   On: 12/08/2022 13:45   US Venous Img Lower Unilateral Right (DVT)  Result Date: 12/07/2022 CLINICAL DATA:  Right lower extremity pain and edema. Evaluate for DVT. EXAM: RIGHT LOWER EXTREMITY VENOUS DOPPLER ULTRASOUND TECHNIQUE: Gray-scale sonography with graded compression, as well as color Doppler and duplex ultrasound were performed to evaluate the lower extremity deep venous systems from the level of the common femoral vein and including the common femoral, femoral, profunda femoral, popliteal and calf veins including the posterior tibial, peroneal and gastrocnemius veins when visible. The superficial great saphenous vein was also interrogated. Spectral Doppler was utilized to evaluate flow at rest and with distal augmentation maneuvers in the common femoral, femoral and popliteal veins. COMPARISON:  None Available. FINDINGS: Contralateral Common Femoral Vein: Respiratory phasicity is normal and symmetric with the symptomatic side. No evidence of thrombus. Normal compressibility. Common Femoral Vein: No evidence of thrombus. Normal compressibility, respiratory phasicity and response to augmentation. Saphenofemoral Junction: No evidence of thrombus. Normal compressibility and flow on color Doppler imaging. Profunda Femoral Vein: No evidence of thrombus. Normal compressibility and flow on color Doppler imaging. Femoral Vein: No evidence of thrombus. Normal compressibility, respiratory phasicity and response to augmentation. Popliteal Vein: No evidence of thrombus. Normal compressibility, respiratory phasicity and response to augmentation. Calf Veins: No evidence of thrombus. Normal compressibility and flow on color Doppler imaging. Superficial Great Saphenous Vein: No evidence of thrombus. Normal compressibility. Other Findings:  None. IMPRESSION: No evidence of DVT within the right lower  extremity. Electronically Signed   By: Simonne Come M.D.   On: 12/07/2022 16:46      Assessment/Plan Principal Problem:   Enteritis Active Problems:   Sepsis (HCC)   Hypertension   Chronic diastolic CHF (congestive heart failure) (HCC)   Myocardial injury   Hyponatremia   Atrial fibrillation with RVR (HCC)   Hypercholesterolemia   Right knee pain   Protein-calorie malnutrition, moderate (HCC)   Assessment and Plan:  Enteritis and sepsis: pt has nausea, vomiting, blood in stool.  CT scan showed possible infectious enteritis associated with ileus, less likely to have obstruction.  Patient meets criteria for sepsis with WBC 23.9 and heart rate up to 140s.  Lactic acid is normal 1.9, does not seem to have ischemia bowel. Hgb 13.7  -will admit to PCU as inpt -Rocephin and Flagyl were started in ED, will continue -Check C. difficile and GI pathogen panel -Pain control: As needed fentanyl, tramadol, low-dose 325 mg of as needed Tylenol for pain -As needed Zofran -IV fluid: Normal saline 500 cc x 2 and then 75 cc/h -check INR/PTT/type screen -f/u CBC 18h  Hypertension -IV hydralazine as needed -Continue home Cardizem 100 mg daily  Chronic diastolic CHF (congestive heart failure) (HCC): 2D echo on 12/01/2022 showed EF acetic acid 5% with grade 1 diastolic dysfunction.  Patient has trace leg edema, no JVD, no shortness breath, CHF seem to  be compensated. -Hold Lasix and spironolactone since patient need IV fluid -Check BNP  Myocardial injury: Troponin level 23 --> 41.  No chest pain. -Get another troponin -Lipitor -hold ASA due to blood in stool  Hyponatremia: Sodium 130 -Patient has a team chloride tablet 1 g 3 times daily -Patient is normal saline IV fluid as above  Atrial fibrillation with RVR (HCC): Heart rate up to 140s --> 70s --> 120s -continue home oral Cardizem 100 mg daily -Start Cardizem drip  Hypercholesterolemia -Lipitor  Right knee pain: Recent admission,  finished a course of Keflex -Continue prednisone tapering  Protein-calorie malnutrition, moderate (HCC): Body weight 54.4 kg, BMI 19.96 -Ensure    DVT ppx: SCD  Code Status: DNR per her daughter who is POA  Family Communication:  Yes, patient's  son and daughter  at bed side. Another daughter  by phone  Disposition Plan:  Anticipate discharge back to previous environment  Consults called:  none  Admission status and Level of care: Progressive:    as inpt       Dispo: The patient is from: Home              Anticipated d/c is to: Home              Anticipated d/c date is: 2 days              Patient currently is not medically stable to d/c.    Severity of Illness:  The appropriate patient status for this patient is INPATIENT. Inpatient status is judged to be reasonable and necessary in order to provide the required intensity of service to ensure the patient's safety. The patient's presenting symptoms, physical exam findings, and initial radiographic and laboratory data in the context of their chronic comorbidities is felt to place them at high risk for further clinical deterioration. Furthermore, it is not anticipated that the patient will be medically stable for discharge from the hospital within 2 midnights of admission.   * I certify that at the point of admission it is my clinical judgment that the patient will require inpatient hospital care spanning beyond 2 midnights from the point of admission due to high intensity of service, high risk for further deterioration and high frequency of surveillance required.*       Date of Service 12/08/2022    Lorretta Harp [facility]   If 7PM-7AM, please contact night-coverage [email or web address] 12/08/2022, 4:49 PM

## 2022-12-08 NOTE — ED Provider Notes (Signed)
[facility] Provider Note    Event Date/Time   First MD Initiated Contact with Patient 12/08/22 1134     (approximate)   History   Rectal Bleeding   HPI  Kaitlyn Huff is a 82 y.o. female who presents to the ER for evaluation of nausea vomiting diarrhea with bloody stool this morning.  No melena.  No coffee-ground emesis.  Just recently admitted for workup hyponatremia and arthritis.  Is having diffuse abdominal pain.  Discharged on prednisone.  Does have a history of reflux and esophagitis.  No measured temperature or fever.     Physical Exam   Triage Vital Signs: ED Triage Vitals  Enc Vitals Group     BP 12/08/22 1100 (!) 156/93     Pulse Rate 12/08/22 1100 65     Resp 12/08/22 1100 16     Temp 12/08/22 1100 (!) 97.5 F (36.4 C)     Temp Source 12/08/22 1100 Oral     SpO2 12/08/22 1100 98 %     Weight 12/08/22 1045 119 lb 14.9 oz (54.4 kg)     Height 12/08/22 1045 5\' 5"  (1.651 m)     Head Circumference --      Peak Flow --      Pain Score 12/08/22 1045 0     Pain Loc --      Pain Edu? --      Excl. in GC? --     Most recent vital signs: Vitals:   12/08/22 1100 12/08/22 1400  BP: (!) 156/93 (!) 141/92  Pulse: 65 73  Resp: 16 13  Temp: (!) 97.5 F (36.4 C)   SpO2: 98% 98%     Constitutional: Alert  Eyes: Conjunctivae are normal.  Head: Atraumatic. Nose: No congestion/rhinnorhea. Mouth/Throat: Mucous membranes are moist.   Neck: Painless ROM.  Cardiovascular:   Good peripheral circulation. Respiratory: Normal respiratory effort.  No retractions.  Irregularly irregular Gastrointestinal: Soft with epigastric tenderness palpation. Musculoskeletal:  no deformity Neurologic:  MAE spontaneously. No gross focal neurologic deficits are appreciated.  Skin:  Skin is warm, dry and intact. No rash noted. Psychiatric: Mood and affect are normal. Speech and behavior are normal.    ED Results / Procedures / Treatments   Labs (all labs  ordered are listed, but only abnormal results are displayed) Labs Reviewed  COMPREHENSIVE METABOLIC PANEL - Abnormal; Notable for the following components:      Result Value   Sodium 130 (*)    Chloride 93 (*)    Glucose, Bld 157 (*)    BUN 26 (*)    Calcium 8.6 (*)    Albumin 3.4 (*)    All other components within normal limits  CBC - Abnormal; Notable for the following components:   WBC 23.9 (*)    Platelets 520 (*)    All other components within normal limits  TROPONIN I (HIGH SENSITIVITY) - Abnormal; Notable for the following components:   Troponin I (High Sensitivity) 23 (*)    All other components within normal limits  TROPONIN I (HIGH SENSITIVITY) - Abnormal; Notable for the following components:   Troponin I (High Sensitivity) 41 (*)    All other components within normal limits  CULTURE, BLOOD (ROUTINE X 2)  CULTURE, BLOOD (ROUTINE X 2)  LACTIC ACID, PLASMA  LACTIC ACID, PLASMA  POC OCCULT BLOOD, ED  TYPE AND SCREEN     EKG  ED ECG REPORT I, Willy Eddy, the attending physician, personally  viewed and interpreted this ECG.   Date: 12/08/2022  EKG Time: 17:55  Rate: 65  Rhythm: afib  Axis: normal  Intervals: normal qt  ST&T Change: no stemi, no depressions    RADIOLOGY Please see ED Course for my review and interpretation.  I personally reviewed all radiographic images ordered to evaluate for the above acute complaints and reviewed radiology reports and findings.  These findings were personally discussed with the patient.  Please see medical record for radiology report.    PROCEDURES:  Critical Care performed: No  Procedures   MEDICATIONS ORDERED IN ED: Medications  diltiazem (CARDIZEM) injection 10 mg (has no administration in time range)  cefTRIAXone (ROCEPHIN) 2 g in sodium chloride 0.9 % 100 mL IVPB (has no administration in time range)  metroNIDAZOLE (FLAGYL) IVPB 500 mg (has no administration in time range)  sodium chloride 0.9 % bolus  500 mL (0 mLs Intravenous Stopped 12/08/22 1404)  ondansetron (ZOFRAN) injection 4 mg (4 mg Intravenous Given 12/08/22 1200)  iohexol (OMNIPAQUE) 300 MG/ML solution 100 mL (100 mLs Intravenous Contrast Given 12/08/22 1205)  sodium chloride 0.9 % bolus 500 mL (500 mLs Intravenous New Bag/Given 12/08/22 1440)     IMPRESSION / MDM / ASSESSMENT AND PLAN / ED COURSE  I reviewed the triage vital signs and the nursing notes.                              Differential diagnosis includes, but is not limited to, SBO, enteritis, gastritis, perforation, colitis, diverticulitis, mesenteric ischemia, ACS, electrolyte abnormality   Patient presenting to the ER for evaluation of symptoms as described above.  Based on symptoms, risk factors and considered above differential, this presenting complaint could reflect a potentially life-threatening illness therefore the patient will be placed on continuous pulse oximetry and telemetry for monitoring.  Laboratory evaluation will be sent to evaluate for the above complaints.      Clinical Course as of 12/08/22 1533  Thu Dec 08, 2022  1316 CT imaging on my review and interpretation concerning for small bowel obstruction.  Awaiting formal radiology report. [PR]  1528 Patient with CT imaging of ileus.  Lactate negative therefore have a lower suspicion for ischemic colitis.  Heart rate upward trending.  Will give rate controlling medication IV as she is not tolerating p.o. will consult hospitalist for admission. [PR]    Clinical Course User Index [PR] Willy Eddy, MD     FINAL CLINICAL IMPRESSION(S) / ED DIAGNOSES   Final diagnoses:  Enteritis  Ileus (HCC)  Atrial fibrillation with rapid ventricular response (HCC)     Rx / DC Orders   ED Discharge Orders     None        Note:  This document was prepared using Dragon voice recognition software and may include unintentional dictation errors.    Willy Eddy, MD 12/08/22 [phone]

## 2022-12-08 NOTE — Progress Notes (Deleted)
Subjective:    Patient ID: Kaitlyn Huff, female    DOB: 10/01/1940, 82 y.o.   MRN: [id number]  Patient here for No chief complaint on file.   HPI Here for hospital follow up.  Admitted 11/29/22 - 12/04/22 after presenting with increased pain and swelling of right knee.  S/p right knee aspiration and s/p right knee arthroscopic washout.  No organisms or crystals seen on aspiration.  OR cultures did not show any organism.  Received IV abx and these were switched to ancef and discharged on keflex.  Had f/u with ortho this week. Went into afib post op.  POD 1 - had chest pain.  Cardiac enzymes flat.  Started on aspirin.  CT chest - moderate hiatal hernia and no PE. Continued on cardizem.  No anticoagulation other than aspirin due to high fall risk.  Sodium dropped during hospitalization.  Felt to be secondary to not eating well. (Sodium 123).  Was started on salt tablets tid.  Also given dose of lasix.  Needs met b today.   Also had acute pain - left elbow.  Given solumedrol and changed to prednisone taper at discharge.     Past Medical History:  Diagnosis Date   Anemia    Cancer (HCC)    Diverticulitis    GERD (gastroesophageal reflux disease)    Hyperlipidemia    Hypertension    Osteoarthritis    Osteoporosis    actonel   Positive PPD    s/p INH (2006)   Rheumatoid arthritis(714.0)    MTX transaminitis, Leflunomide (rash), enbrel, plaquinil, prednisone, remicade, Imuran (transaminitis)   Valvular heart disease    moderate MR and TR   Past Surgical History:  Procedure Laterality Date   ABDOMINAL HYSTERECTOMY     CERVICAL CONE BIOPSY     CHOLECYSTECTOMY  06/22/14   COLONOSCOPY WITH PROPOFOL N/A 07/09/2015   Procedure: COLONOSCOPY WITH PROPOFOL;  Surgeon: Scot Jun, MD;  Location: [facility] ENDOSCOPY;  Service: Endoscopy;  Laterality: N/A;   ESOPHAGOGASTRODUODENOSCOPY (EGD) WITH PROPOFOL N/A 07/09/2015   Procedure: ESOPHAGOGASTRODUODENOSCOPY (EGD) WITH PROPOFOL;  Surgeon: Scot Jun, MD;  Location: [facility] ENDOSCOPY;  Service: Endoscopy;  Laterality: N/A;   IRRIGATION AND DEBRIDEMENT KNEE Right 11/30/2022   Procedure: RIGHT KNEE ARTHROSCOPIC WASHOUT;  Surgeon: Juanell Fairly, MD;  Location: [facility] ORS;  Service: Orthopedics;  Laterality: Right;   OVARY SURGERY     TRACHEOSTOMY  1959   TUBAL LIGATION     Family History  Problem Relation Age of Onset   Heart disease Father        MI   Heart disease Mother    Valvular heart disease Mother    Breast cancer Sister 18   Cancer Sister        Lung cancer   COPD Sister    Heart disease Sister    Colon cancer Neg Hx    Social History   Socioeconomic History   Marital status: Widowed    Spouse name: Not on file   Number of children: 4   Years of education: Not on file   Highest education level: Not on file  Occupational History   Occupation: retired  Tobacco Use   Smoking status: Never   Smokeless tobacco: Never  Substance and Sexual Activity   Alcohol use: No    Alcohol/week: 0.0 standard drinks of alcohol   Drug use: No   Sexual activity: Never  Other Topics Concern   Not on file  Social History Narrative  Lives with family at home   Social Determinants of Health   Financial Resource Strain: Low Risk  (09/02/2022)   Overall Financial Resource Strain (CARDIA)    Difficulty of Paying Living Expenses: Not hard at all  Food Insecurity: No Food Insecurity (12/06/2022)   Hunger Vital Sign    Worried About Running Out of Food in the Last Year: Never true    Ran Out of Food in the Last Year: Never true  Transportation Needs: No Transportation Needs (12/06/2022)   PRAPARE - Administrator, Civil Service (Medical): No    Lack of Transportation (Non-Medical): No  Physical Activity: Unknown (09/02/2022)   Exercise Vital Sign    Days of Exercise per Week: 0 days    Minutes of Exercise per Session: Not on file  Stress: No Stress Concern Present (09/02/2022)   [employer] of Occupational  Health - Occupational Stress Questionnaire    Feeling of Stress : Not at all  Social Connections: Unknown (09/02/2022)   Social Connection and Isolation Panel [NHANES]    Frequency of Communication with Friends and Family: More than three times a week    Frequency of Social Gatherings with Friends and Family: More than three times a week    Attends Religious Services: Not on file    Active Member of Clubs or Organizations: Yes    Attends Banker Meetings: Not on file    Marital Status: Not on file     Review of Systems     Objective:     There were no vitals taken for this visit. Wt Readings from Last 3 Encounters:  12/04/22 119 lb 14.9 oz (54.4 kg)  11/08/22 120 lb 9.6 oz (54.7 kg)  09/02/22 125 lb (56.7 kg)    Physical Exam   Outpatient Encounter Medications as of 12/08/2022  Medication Sig   albuterol (VENTOLIN HFA) 108 (90 Base) MCG/ACT inhaler USE 2 INHALATIONS EVERY 6 HOURS AS NEEDED FOR WHEEZING OR SHORTNESS OF BREATH   ALPRAZolam (XANAX) 0.25 MG tablet Take 1 tablet (0.25 mg total) by mouth 2 (two) times daily as needed for anxiety.   aspirin EC 81 MG tablet Take 1 tablet (81 mg total) by mouth daily. Swallow whole.   atorvastatin (LIPITOR) 20 MG tablet Take 20 mg by mouth daily.   cephALEXin (KEFLEX) 500 MG capsule Take 1 capsule (500 mg total) by mouth 4 (four) times daily for 4 days.   cetirizine (ZYRTEC) 10 MG tablet Take 1 tablet (10 mg total) by mouth daily.   colestipol (COLESTID) 1 g tablet Take 2 g by mouth daily.   diltiazem (CARDIZEM CD) 180 MG 24 hr capsule Take 180 mg by mouth daily.   feeding supplement (ENSURE ENLIVE / ENSURE PLUS) LIQD Take 237 mLs by mouth 2 (two) times daily between meals.   fluticasone (FLONASE) 50 MCG/ACT nasal spray USE 2 SPRAYS IN EACH NOSTRIL DAILY (CALL OFFICE SOON TO RESCHEDULE YOUR PHYSICAL)   furosemide (LASIX) 20 MG tablet Take 1 tablet (20 mg total) by mouth daily.   gabapentin (NEURONTIN) 100 MG capsule Take  100 mg by mouth daily.   lip balm (BLISTEX) OINT Apply 1 application topically as needed for lip care.   Multiple Vitamin (MULTIVITAMIN WITH MINERALS) TABS tablet Take 1 tablet by mouth daily.   nitroGLYCERIN (NITROSTAT) 0.4 MG SL tablet Place 1 tablet (0.4 mg total) under the tongue every 5 (five) minutes as needed for chest pain (maximum 3).   omeprazole (PRILOSEC) 20  MG capsule Take 1 capsule by mouth daily.   polyethylene glycol (MIRALAX / GLYCOLAX) 17 g packet Take 17 g by mouth daily as needed for moderate constipation.   predniSONE (DELTASONE) 10 MG tablet 4 tabs po day 1; 3 tabs po day 2; 2 tabs po day3,4; 1 tab po day5,6; 1/2 tab po day 7,8   Probiotic Product (ALIGN PO) Take 1 tablet by mouth daily.   sodium chloride 1 g tablet Take 1 tablet (1 g total) by mouth 3 (three) times daily with meals.   spironolactone (ALDACTONE) 25 MG tablet Take 12.5 mg by mouth daily.   traMADol (ULTRAM) 50 MG tablet Take 1 tablet (50 mg total) by mouth every 6 (six) hours as needed for moderate pain.   No facility-administered encounter medications on file as of 12/08/2022.     Lab Results  Component Value Date   WBC 15.4 (H) 12/03/2022   HGB 10.0 (L) 12/03/2022   HCT 28.3 (L) 12/03/2022   PLT 251 12/03/2022   GLUCOSE 137 (H) 12/04/2022   CHOL 132 12/02/2022   TRIG 91 12/02/2022   HDL 65 12/02/2022   LDLDIRECT 73.7 08/20/2013   LDLCALC 49 12/02/2022   ALT 19 11/08/2022   AST 34 11/08/2022   NA 124 (L) 12/04/2022   K 4.5 12/04/2022   CL 92 (L) 12/04/2022   CREATININE 0.79 12/04/2022   BUN 39 (H) 12/04/2022   CO2 26 12/04/2022   TSH 0.60 11/08/2022   INR 1.8 (H) 11/22/2020   HGBA1C 6.4 11/08/2022    US Venous Img Lower Unilateral Right (DVT)  Result Date: 12/07/2022 CLINICAL DATA:  Right lower extremity pain and edema. Evaluate for DVT. EXAM: RIGHT LOWER EXTREMITY VENOUS DOPPLER ULTRASOUND TECHNIQUE: Gray-scale sonography with graded compression, as well as color Doppler and duplex  ultrasound were performed to evaluate the lower extremity deep venous systems from the level of the common femoral vein and including the common femoral, femoral, profunda femoral, popliteal and calf veins including the posterior tibial, peroneal and gastrocnemius veins when visible. The superficial great saphenous vein was also interrogated. Spectral Doppler was utilized to evaluate flow at rest and with distal augmentation maneuvers in the common femoral, femoral and popliteal veins. COMPARISON:  None Available. FINDINGS: Contralateral Common Femoral Vein: Respiratory phasicity is normal and symmetric with the symptomatic side. No evidence of thrombus. Normal compressibility. Common Femoral Vein: No evidence of thrombus. Normal compressibility, respiratory phasicity and response to augmentation. Saphenofemoral Junction: No evidence of thrombus. Normal compressibility and flow on color Doppler imaging. Profunda Femoral Vein: No evidence of thrombus. Normal compressibility and flow on color Doppler imaging. Femoral Vein: No evidence of thrombus. Normal compressibility, respiratory phasicity and response to augmentation. Popliteal Vein: No evidence of thrombus. Normal compressibility, respiratory phasicity and response to augmentation. Calf Veins: No evidence of thrombus. Normal compressibility and flow on color Doppler imaging. Superficial Great Saphenous Vein: No evidence of thrombus. Normal compressibility. Other Findings:  None. IMPRESSION: No evidence of DVT within the right lower extremity. Electronically Signed   By: Simonne Come M.D.   On: 12/07/2022 16:46       Assessment & Plan:  There are no diagnoses linked to this encounter.   Dale Prince [place], MD

## 2022-12-08 NOTE — ED Notes (Signed)
Pt to CT

## 2022-12-08 NOTE — Progress Notes (Signed)
Chap responded to Page, passed by the nurse in charge for some briefing then headed to Pt room. Pt in with family. Pt expressed a desire for a prayer which this Mirna Mires honored. We held hands with the son and daughter as I offered an intercessory prayer for the Pt. Pt and Family appreciative.   12/08/22 1500  Spiritual Encounters  Type of Visit Initial  Care provided to: Pt and family  Conversation partners present during encounter Nurse  Referral source Patient request  Reason for visit Urgent spiritual support  OnCall Visit Yes  Spiritual Framework  Presenting Themes Coping tools;Meaning/purpose/sources of inspiration  Community/Connection Family  Interventions  Spiritual Care Interventions Made Established relationship of care and support;Mindfulness intervention;Prayer  Intervention Outcomes  Outcomes Awareness of support;Connection to spiritual care  Spiritual Care Plan  Spiritual Care Issues Still Outstanding No further spiritual care needs at this time (see row info)

## 2022-12-08 NOTE — ED Notes (Signed)
Assisted pt with bedpan for urination, provided ginger ale

## 2022-12-08 NOTE — ED Notes (Signed)
Assisted to the BR

## 2022-12-09 ENCOUNTER — Telehealth: Payer: Self-pay | Admitting: Internal Medicine

## 2022-12-09 ENCOUNTER — Encounter: Payer: Self-pay | Admitting: Internal Medicine

## 2022-12-09 DIAGNOSIS — A419 Sepsis, unspecified organism: Secondary | ICD-10-CM

## 2022-12-09 DIAGNOSIS — I4891 Unspecified atrial fibrillation: Secondary | ICD-10-CM | POA: Diagnosis not present

## 2022-12-09 DIAGNOSIS — K529 Noninfective gastroenteritis and colitis, unspecified: Secondary | ICD-10-CM

## 2022-12-09 DIAGNOSIS — I10 Essential (primary) hypertension: Secondary | ICD-10-CM | POA: Diagnosis not present

## 2022-12-09 LAB — CBC
HCT: 36.9 % (ref 36.0–46.0)
Hemoglobin: 12.4 g/dL (ref 12.0–15.0)
MCH: 31.5 pg (ref 26.0–34.0)
MCHC: 33.6 g/dL (ref 30.0–36.0)
MCV: 93.7 fL (ref 80.0–100.0)
Platelets: 430 10*3/uL — ABNORMAL HIGH (ref 150–400)
RBC: 3.94 MIL/uL (ref 3.87–5.11)
RDW: 12.7 % (ref 11.5–15.5)
WBC: 25.5 10*3/uL — ABNORMAL HIGH (ref 4.0–10.5)
nRBC: 0 % (ref 0.0–0.2)

## 2022-12-09 LAB — BASIC METABOLIC PANEL
Anion gap: 6 (ref 5–15)
BUN: 30 mg/dL — ABNORMAL HIGH (ref 8–23)
CO2: 22 mmol/L (ref 22–32)
Calcium: 7.2 mg/dL — ABNORMAL LOW (ref 8.9–10.3)
Chloride: 101 mmol/L (ref 98–111)
Creatinine, Ser: 0.7 mg/dL (ref 0.44–1.00)
GFR, Estimated: >60 mL/min (ref >60)
Glucose, Bld: 174 mg/dL — ABNORMAL HIGH (ref 70–99)
Potassium: 4.3 mmol/L (ref 3.5–5.1)
Sodium: 129 mmol/L — ABNORMAL LOW (ref 135–145)

## 2022-12-09 LAB — CULTURE, BLOOD (ROUTINE X 2)

## 2022-12-09 LAB — PROCALCITONIN: Procalcitonin: <0.1 ng/mL

## 2022-12-09 MED ORDER — ONDANSETRON HCL 4 MG PO TABS: 4.0000 mg | ORAL_TABLET | Freq: Two times a day (BID) | ORAL | 0 refills | Status: DC

## 2022-12-09 MED ORDER — DIGOXIN 0.25 MG/ML IJ SOLN
0.2500 mg | Freq: Every day | INTRAMUSCULAR | Status: DC
  Filled 2022-12-09: qty 2

## 2022-12-09 NOTE — Assessment & Plan Note (Signed)
Recent admission, finished a course of Keflex -Continue prednisone tapering

## 2022-12-09 NOTE — Assessment & Plan Note (Signed)
Heart rate mostly in 120s.  Patient was on Cardizem at home. Started on Cardizem infusion. -Cardiology was consulted as heart rate remained elevated. -Continue with Cardizem infusion and titrate as appropriate

## 2022-12-09 NOTE — Telephone Encounter (Signed)
Reviewed records.  With her presentation (labs, symptoms, including bloody stool, vomiting, etc), she needs to be in the hospital where she can be observed and monitored.  Needs close f/u of her electrolytes - especially her sodium.  I recommend her going back to the hospital for further care and treatment and observation.  I can send in zofran, but she needs further treatment and monitoring.  (Zofran 4mg  bid prn #10 no refills).

## 2022-12-09 NOTE — Assessment & Plan Note (Signed)
-  Continue Lipitor °

## 2022-12-09 NOTE — Telephone Encounter (Signed)
Pt daughter called stating the pt left the hospital against medical advice and would like nausea medicine called in for the stomach virus she has. Pt daughter stated the pt was up at 4am gagging and she believes that is what put her in afib. Pt daughter stated the pt just want to go home and die

## 2022-12-09 NOTE — Assessment & Plan Note (Signed)
Mild nausea but no vomiting or diarrhea.  C. difficile and GI pathogen panel was ordered but never collected as there was no bowel movement.  Procalcitonin negative -Continue with supportive care

## 2022-12-09 NOTE — Assessment & Plan Note (Signed)
Patient was on Cardizem at home. -Continue home Cardizem -As needed hydralazine

## 2022-12-09 NOTE — Consult Note (Signed)
   [facility] Inpatient Consult   12/09/2022  Kaitlyn Huff Apr 20, 1941 [id number]  Primary Care Provider:  Dr. Lorin Huff  Patient is currently active with [facility] [[facility]] Care Management for chronic disease management services.  Patient has been engaged by a [facility].  Our community based plan of care has focused on disease management and community resource support. Will collaborate with the active [facility] RN concerning pt's d/c  12/04/2022 and recent ED visit today 12/09/2022.  Patient will receive a post hospital call and will be evaluated for assessments and disease process education.    Of note, [facility] Care Management services does not replace or interfere with any services that are needed or arranged by inpatient [facility] care management team.   For additional questions or referrals please contact:  Kaitlyn Cousin, RN, BSN [facility] Liaison [place]   [facility]  Population Health Office Hours MTWF  8:00 am-6:00 pm Off on Thursday [phone] mobile [phone] [Office toll free line][facility] Office Hours are M-F 8:30 - 5 pm 24 hour nurse advise line [phone] Concierge  [email or web address]

## 2022-12-09 NOTE — ED Notes (Signed)
Helped pt on and off bedpan, she rolls and repositions independently

## 2022-12-09 NOTE — Assessment & Plan Note (Signed)
Seems chronic and stable.  Corrected sodium around baseline. -Continue salt tablets

## 2022-12-09 NOTE — Assessment & Plan Note (Signed)
2D echo on 12/01/2022 showed EF acetic acid 5% with grade 1 diastolic dysfunction.  Patient has trace leg edema, no JVD, no shortness breath, CHF seem to be compensated.  BNP at 469 -Hold Lasix and spironolactone  -Stop IV fluid and monitor

## 2022-12-09 NOTE — Progress Notes (Signed)
Progress Note   Patient: Kaitlyn Huff:[id number] DOB: 1940-09-28 DOA: 12/08/2022     1 DOS: the patient was seen and examined on 12/09/2022   Brief hospital course: Taken from H&P.  Kaitlyn Huff is a 82 y.o. female with medical history significant of HTN, HLD, dCHF, GERD, RA, diverticulitis, A fib not on anticoagulants, right knee pain, who presents with nausea vomiting and bloody stool, with associated abdominal pain.   Patient was recently hospitalized from 5/21 - 5/26 due to acute right knee pain. Per discharge summary, there was no organisms or crystals seen on aspiration, with 38,335 white blood cells on aspiration, and cultures did not show any organism.  Patient was discharged home on Keflex.  Patient finished a course of Keflex.  Data reviewed independently and ED Course: pt was found to have WBC 23.9, GFR> 60, sodium 130, potassium 3.9, temperature 97.5, blood pressure 141/92, RR 16, oxygen saturation 98% on room air.   CT abdomen/pelvis: 1. Focal small bowel wall thickening with associated mesenteric edema, focal hyperemia and upstream dilation of the proximal small bowel. Findings favored to be due to infectious enteritis with associated ileus. Less likely ischemic or inflammatory enteritis, recommend correlation with serum lactate. Associated small bowel obstruction also a consideration, but less likely given gradual transition. 2. Small volume abdominopelvic ascites. 3. Aortic Atherosclerosis (ICD10-I70.0).    EKG: A-fib, QTc 392, early R wave progression, heart rate of 112.  C. difficile and GI pathogen panel was ordered and she was started on ceftriaxone and Flagyl.  5/31: Vitals with RVR at 124, labs with stable corrected sodium at 130.  Procalcitonin negative, some worsening of leukocytosis at 25.5.  Remained on Cardizem infusion.  Cardiology was also consulted.    Assessment and Plan: * Enteritis Mild nausea but no vomiting or diarrhea.  C. difficile and GI  pathogen panel was ordered but never collected as there was no bowel movement.  Procalcitonin negative -Continue with supportive care  Sepsis (HCC) Met sepsis criteria with leukocytosis and tachycardia.  Lactic acid normal.  Most likely secondary to viral enteritis.  Procalcitonin negative.  Preliminary blood cultures negative. -Continue with Rocephin and Flagyl for now -Continue with supportive care  Atrial fibrillation with RVR (HCC) Heart rate mostly in 120s.  Patient was on Cardizem at home. Started on Cardizem infusion. -Cardiology was consulted as heart rate remained elevated. -Continue with Cardizem infusion and titrate as appropriate  Hypertension Patient was on Cardizem at home. -Continue home Cardizem -As needed hydralazine  Chronic diastolic CHF (congestive heart failure) (HCC)  2D echo on 12/01/2022 showed EF acetic acid 5% with grade 1 diastolic dysfunction.  Patient has trace leg edema, no JVD, no shortness breath, CHF seem to be compensated.  BNP at 469 -Hold Lasix and spironolactone  -Stop IV fluid and monitor  Myocardial injury  Troponin level 23 --> 41.  No chest pain  -Continue home Lipitor -Holding home aspirin due to concern of some blood in stool  Hypercholesterolemia -Continue Lipitor  Hyponatremia Seems chronic and stable.  Corrected sodium around baseline. -Continue salt tablets  Right knee pain Recent admission, finished a course of Keflex -Continue prednisone tapering  Protein-calorie malnutrition, moderate (HCC) Estimated body mass index is 19.96 kg/m as calculated from the following:   Height as of this encounter: 5\' 5"  (1.651 m).   Weight as of this encounter: 54.4 kg.   -Nutritional consult -Continue with supplement   Subjective: Patient was seen and examined today.  Having mild nausea  but no vomiting or diarrhea.  Mild abdominal pain.  No chest pain.  Wants to go home.  Physical Exam: Vitals:   12/09/22 1015 12/09/22 1030 12/09/22  1045 12/09/22 1100  BP:  (!) 135/92  (!) 151/77  Pulse: 68 64 94 (!) 127  Resp: (!) 21 20 20  (!) 21  Temp:      TempSrc:      SpO2: 98% 99% 97% 95%  Weight:      Height:       General.  Frail and malnourished lady, in no acute distress. Pulmonary.  Lungs clear bilaterally, normal respiratory effort. CV.  Irregularly irregular Abdomen.  Soft, nontender, nondistended, BS positive. CNS.  Alert and oriented .  No focal neurologic deficit. Extremities.  No edema, no cyanosis, pulses intact and symmetrical. Psychiatry.  Judgment and insight appears normal.   Data Reviewed: Prior data reviewed  Family Communication: Discussed with daughter at bedside  Disposition: Status is: Inpatient Remains inpatient appropriate because: Severity of illness  Planned Discharge Destination: Home  DVT prophylaxis.  SCDs Time spent: 50 minutes  This record has been created using Conservation officer, historic buildings. Errors have been sought and corrected,but may not always be located. Such creation errors do not reflect on the standard of care.   Author: Arnetha Courser, MD 12/09/2022 1:57 PM  For on call review [email or web address].

## 2022-12-09 NOTE — ED Notes (Signed)
Patient readjusted in bed, now sitting up. Patient given a breakfast tray, patient's daughter currently assisting her with feeding her breakfast.

## 2022-12-09 NOTE — Consult Note (Signed)
Kaitlyn Huff is a 82 y.o. female  [id number]  Primary Cardiologist: Dr. Juliann Pares Reason for Consultation: Atrial fibrillation with rapid ventricular response rate  HPI: This is an 82 year old white female with a past medical history of persistent atrial fibrillation was seen recently by Dr. Juliann Pares in the office and had controlled ventricular response rate and is not on anticoagulant because of prior history of GI bleed.  Patient presented this time with nausea vomiting and was found to have infectious enteritis  associated with ileus.  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    (Not in a hospital admission)     acidophilus  1 capsule Oral Daily   atorvastatin  20 mg Oral Daily   colestipol  2 g Oral Daily   diltiazem  180 mg Oral Daily   feeding supplement  237 mL Oral BID BM   fluticasone  2 spray Each Nare Daily   gabapentin  100 mg Oral Daily   loratadine  10 mg Oral Daily   multivitamin with minerals  1 tablet Oral Daily   pantoprazole  40 mg Oral Daily   predniSONE  10 mg Oral Q breakfast   [START ON 12/11/2022] predniSONE  5 mg Oral Q breakfast   sodium chloride  1 g Oral TID WC    Infusions:  cefTRIAXone (ROCEPHIN)  IV     diltiazem (CARDIZEM) infusion 7 mg/hr (12/09/22 1116)   metronidazole Stopped (12/09/22 0529)    Allergies  Allergen Reactions   Astelin [Azelastine Hcl] Other (See Comments)    Reaction:  Unknown    Codeine Other (See Comments)     Reaction:  Altered mental status   Dilaudid [Hydromorphone] Other (See Comments)    Reaction:  Unknown    Flexeril [Cyclobenzaprine] Other (See Comments)    Reaction:  Unknown    Imuran [Azathioprine] Other (See Comments)    Reaction:  Abnormal liver function   Lisinopril Itching   Lyrica [Pregabalin] Other (See Comments)    Reaction:  Sore gums    Methotrexate Derivatives Other (See Comments)    Reaction:  Abnormal liver function   Amiodarone Anxiety and Other (See Comments)    Pt could not eat or sleep. Caused n/v and "messed her up"   Amoxicillin Rash and Other (See Comments)    Patient has tolerated multiple cepalosporins Unable to obtain enough information to answer additional questions about this medication.     Arava [Leflunomide] Rash   Clindamycin/Lincomycin Rash   Doxycycline Rash   Lodine [Etodolac] Rash   Percocet [Oxycodone-Acetaminophen] Rash   Sulfa Antibiotics Rash    Social History   Socioeconomic History   Marital status: Widowed    Spouse name: Not on file   Number of children: 4   Years of education: Not on file   Highest education level: Not on file  Occupational History   Occupation: retired  Tobacco Use  Smoking status: Never   Smokeless tobacco: Never  Substance and Sexual Activity   Alcohol use: No    Alcohol/week: 0.0 standard drinks of alcohol   Drug use: No   Sexual activity: Never  Other Topics Concern   Not on file  Social History Narrative   Lives with family at home   Social Determinants of Health   Financial Resource Strain: Low Risk  (09/02/2022)   Overall Financial Resource Strain (CARDIA)    Difficulty of Paying Living Expenses: Not hard at all  Food Insecurity: No Food Insecurity (12/09/2022)   Hunger Vital Sign    Worried About Running Out of Food in the Last Year: Never true    Ran Out of Food in the Last Year: Never true  Transportation Needs: No Transportation Needs (12/09/2022)   PRAPARE - Scientist, research (physical sciences) (Medical): No    Lack of Transportation (Non-Medical): No  Physical Activity: Unknown (09/02/2022)   Exercise Vital Sign    Days of Exercise per Week: 0 days    Minutes of Exercise per Session: Not on file  Stress: No Stress Concern Present (09/02/2022)   [employer] of Occupational Health - Occupational Stress Questionnaire    Feeling of Stress : Not at all  Social Connections: Unknown (09/02/2022)   Social Connection and Isolation Panel [NHANES]    Frequency of Communication with Friends and Family: More than three times a week    Frequency of Social Gatherings with Friends and Family: More than three times a week    Attends Religious Services: Not on file    Active Member of Clubs or Organizations: Yes    Attends Banker Meetings: Not on file    Marital Status: Not on file  Intimate Partner Violence: Not At Risk (12/09/2022)   Humiliation, Afraid, Rape, and Kick questionnaire    Fear of Current or Ex-Partner: No    Emotionally Abused: No    Physically Abused: No    Sexually Abused: No    Family History  Problem Relation Age of Onset   Heart disease Father        MI   Heart disease Mother    Valvular heart disease Mother    Breast cancer Sister 45   Cancer Sister        Lung cancer   COPD Sister    Heart disease Sister    Colon cancer Neg Hx     PHYSICAL EXAM: Vitals:   12/09/22 1435 12/09/22 1440  BP:    Pulse: (!) 104 (!) 124  Resp: (!) 21 (!) 22  Temp:    SpO2: 96% 98%     Intake/Output Summary (Last 24 hours) at 12/09/2022 1444 Last data filed at 12/09/2022 0529 Gross per 24 hour  Intake 198.71 ml  Output --  Net 198.71 ml    General:  Well appearing. No respiratory difficulty HEENT: normal Neck: supple. no JVD. Carotids 2+ bilat; no bruits. No lymphadenopathy or thryomegaly appreciated. Cor: PMI nondisplaced. Regular rate & rhythm. No rubs, gallops or murmurs. Lungs: clear Abdomen: soft, nontender, nondistended. No  hepatosplenomegaly. No bruits or masses. Good bowel sounds. Extremities: no cyanosis, clubbing, rash, edema Neuro: alert & oriented x 3, cranial nerves grossly intact. moves all 4 extremities w/o difficulty. Affect pleasant.  ECG: Atrial fibrillation with a ventricular rate 116 nonspecific ST-T changes  Results for orders placed or performed during the hospital encounter of 12/08/22 (from the past 24 hour(s))  Blood culture (  routine x 2)     Status: None (Preliminary result)   Collection Time: 12/08/22  4:17 PM   Specimen: BLOOD  Result Value Ref Range   Specimen Description BLOOD BLOOD LEFT WRIST    Special Requests      BOTTLES DRAWN AEROBIC AND ANAEROBIC Blood Culture adequate volume   Culture      NO GROWTH < 12 HOURS Performed at [facility], [address] [place], [place] [address]    Report Status PENDING   Blood culture (routine x 2)     Status: None (Preliminary result)   Collection Time: 12/08/22  4:17 PM   Specimen: BLOOD  Result Value Ref Range   Specimen Description BLOOD BLOOD LEFT FOREARM    Special Requests      BOTTLES DRAWN AEROBIC AND ANAEROBIC Blood Culture adequate volume   Culture      NO GROWTH < 12 HOURS Performed at [facility], [address] [place], [place] [address]    Report Status PENDING   Protime-INR     Status: None   Collection Time: 12/08/22  4:17 PM  Result Value Ref Range   Prothrombin Time 14.0 11.4 - 15.2 seconds   INR 1.1 0.8 - 1.2  APTT     Status: None   Collection Time: 12/08/22  4:17 PM  Result Value Ref Range   aPTT 24 24 - 36 seconds  Troponin I (High Sensitivity)     Status: Abnormal   Collection Time: 12/08/22  4:18 PM  Result Value Ref Range   Troponin I (High Sensitivity) 49 (H) <18 ng/L  CBC     Status: Abnormal   Collection Time: 12/09/22 12:11 AM  Result Value Ref Range   WBC 25.5 (H) 4.0 - 10.5 K/uL   RBC 3.94 3.87 - 5.11 MIL/uL   Hemoglobin 12.4 12.0 - 15.0 g/dL   HCT 32.4 40.1 - 02.7 %    MCV 93.7 80.0 - 100.0 fL   MCH 31.5 26.0 - 34.0 pg   MCHC 33.6 30.0 - 36.0 g/dL   RDW 25.3 66.4 - 40.3 %   Platelets 430 (H) 150 - 400 K/uL   nRBC 0.0 0.0 - 0.2 %  Procalcitonin     Status: None   Collection Time: 12/09/22 12:11 AM  Result Value Ref Range   Procalcitonin <0.10 ng/mL  Basic metabolic panel     Status: Abnormal   Collection Time: 12/09/22 12:14 AM  Result Value Ref Range   Sodium 129 (L) 135 - 145 mmol/L   Potassium 4.3 3.5 - 5.1 mmol/L   Chloride 101 98 - 111 mmol/L   CO2 22 22 - 32 mmol/L   Glucose, Bld 174 (H) 70 - 99 mg/dL   BUN 30 (H) 8 - 23 mg/dL   Creatinine, Ser 4.74 0.44 - 1.00 mg/dL   Calcium 7.2 (L) 8.9 - 10.3 mg/dL   GFR, Estimated >25 >95 mL/min   Anion gap 6 5 - 15   CT ABDOMEN PELVIS W CONTRAST  Result Date: 12/08/2022 CLINICAL DATA:  Abdominal pain EXAM: CT ABDOMEN AND PELVIS WITH CONTRAST TECHNIQUE: Multidetector CT imaging of the abdomen and pelvis was performed using the standard protocol following bolus administration of intravenous contrast. RADIATION DOSE REDUCTION: This exam was performed according to the departmental dose-optimization program which includes automated exposure control, adjustment of the mA and/or kV according to patient size and/or use of iterative reconstruction technique. CONTRAST:  OMNIPAQUE IOHEXOL 300 MG/ML  SOLN COMPARISON:  CT abdomen and pelvis dated September 07, 2018 FINDINGS: Lower chest: Cardiomegaly. Moderate hiatal hernia. No acute abnormality. Hepatobiliary: No focal liver abnormality is seen. Status post cholecystectomy. Prominent common bile duct, unchanged when compared with the prior exam. Pancreas: Unremarkable. No pancreatic ductal dilatation or surrounding inflammatory changes. Spleen: Normal in size without focal abnormality. Adrenals/Urinary Tract: Bilateral adrenal glands are unremarkable. No hydronephrosis or nephrolithiasis. Bladder is unremarkable. Stomach/Bowel: Multiple dilated loops of proximal  small bowel and decompressed distal small bowel loops with gradual transition seen in the mid abdomen associated with area of small bowel fecalization, wall thickening, mesenteric edema. Area of focal hyperemia involving the small bowel (series 2, image 47) is also seen at the area of transition. No evidence of pneumatosis. Diverticulosis. Vascular/Lymphatic: Aortic atherosclerosis. SMV and SMA are patent. No enlarged abdominal or pelvic lymph nodes. Reproductive: Status post hysterectomy. No adnexal masses. Other: No abdominal wall hernia or abnormality. Small volume abdominopelvic ascites. Musculoskeletal: Levocurvature of the lumbar spine. Moderate degenerative disc disease of the lumbar spine. No aggressive appearing osseous lesions. IMPRESSION: 1. Focal small bowel wall thickening with associated mesenteric edema, focal hyperemia and upstream dilation of the proximal small bowel. Findings favored to be due to infectious enteritis with associated ileus. Less likely ischemic or inflammatory enteritis, recommend correlation with serum lactate. Associated small bowel obstruction also a consideration, but less likely given gradual transition. 2. Small volume abdominopelvic ascites. 3. Aortic Atherosclerosis (ICD10-I70.0). Electronically Signed   By: Allegra Lai M.D.   On: 12/08/2022 13:45   US Venous Img Lower Unilateral Right (DVT)  Result Date: 12/07/2022 CLINICAL DATA:  Right lower extremity pain and edema. Evaluate for DVT. EXAM: RIGHT LOWER EXTREMITY VENOUS DOPPLER ULTRASOUND TECHNIQUE: Gray-scale sonography with graded compression, as well as color Doppler and duplex ultrasound were performed to evaluate the lower extremity deep venous systems from the level of the common femoral vein and including the common femoral, femoral, profunda femoral, popliteal and calf veins including the posterior tibial, peroneal and gastrocnemius veins when visible. The superficial great saphenous vein was also  interrogated. Spectral Doppler was utilized to evaluate flow at rest and with distal augmentation maneuvers in the common femoral, femoral and popliteal veins. COMPARISON:  None Available. FINDINGS: Contralateral Common Femoral Vein: Respiratory phasicity is normal and symmetric with the symptomatic side. No evidence of thrombus. Normal compressibility. Common Femoral Vein: No evidence of thrombus. Normal compressibility, respiratory phasicity and response to augmentation. Saphenofemoral Junction: No evidence of thrombus. Normal compressibility and flow on color Doppler imaging. Profunda Femoral Vein: No evidence of thrombus. Normal compressibility and flow on color Doppler imaging. Femoral Vein: No evidence of thrombus. Normal compressibility, respiratory phasicity and response to augmentation. Popliteal Vein: No evidence of thrombus. Normal compressibility, respiratory phasicity and response to augmentation. Calf Veins: No evidence of thrombus. Normal compressibility and flow on color Doppler imaging. Superficial Great Saphenous Vein: No evidence of thrombus. Normal compressibility. Other Findings:  None. IMPRESSION: No evidence of DVT within the right lower extremity. Electronically Signed   By: Simonne Come M.D.   On: 12/07/2022 16:46     ASSESSMENT AND PLAN: Atrial fibrillation with rapid ventricular response rate with a heart rate ranging between 105 and 120.  Patient is on Cardizem drip.  Patient's renal function is normal thus will start the patient on digoxin 0.25 mg IV starting today and tomorrow and may taper to 0.125 p.o. or IV once patient is no longer having nausea or vomiting.  Advise getting echocardiogram.  Will continue to  follow the patient closely with you.  Antonetta Clanton Welton Flakes

## 2022-12-09 NOTE — Assessment & Plan Note (Signed)
Troponin level 23 --> 41.  No chest pain  -Continue home Lipitor -Holding home aspirin due to concern of some blood in stool

## 2022-12-09 NOTE — Telephone Encounter (Signed)
Spoke with Kendal Hymen (daughter) & advised of Dr. Roby Lofts recommendation. Pt refuses to go back to the hospital.  Zofran sent to [facility] on corner of [address].  Daughter notified

## 2022-12-09 NOTE — Telephone Encounter (Signed)
Patient left hospital AMA. Spoke with daughter and was told "they do not need anything from [place] except for some nausea medication because the hospital refused to treat her since she wanted to go home." Advised that if AMA papers are signed the hospital will not send her home with medication and that IV abx, fluids, further testing, etc may need to be done inpatient. Daughter stated she understood that but the patient wants to go home to die and that is her choice so she is not fighting with her. Daughter asked that we give them time to get out of the hospital and call her back if we can give them "zofran or something"

## 2022-12-09 NOTE — Hospital Course (Addendum)
Taken from H&P.  Kaitlyn Huff is a 82 y.o. female with medical history significant of HTN, HLD, dCHF, GERD, RA, diverticulitis, A fib not on anticoagulants, right knee pain, who presents with nausea vomiting and bloody stool, with associated abdominal pain.   Patient was recently hospitalized from 5/21 - 5/26 due to acute right knee pain. Per discharge summary, there was no organisms or crystals seen on aspiration, with 38,335 white blood cells on aspiration, and cultures did not show any organism.  Patient was discharged home on Keflex.  Patient finished a course of Keflex.  Data reviewed independently and ED Course: pt was found to have WBC 23.9, GFR> 60, sodium 130, potassium 3.9, temperature 97.5, blood pressure 141/92, RR 16, oxygen saturation 98% on room air.   CT abdomen/pelvis: 1. Focal small bowel wall thickening with associated mesenteric edema, focal hyperemia and upstream dilation of the proximal small bowel. Findings favored to be due to infectious enteritis with associated ileus. Less likely ischemic or inflammatory enteritis, recommend correlation with serum lactate. Associated small bowel obstruction also a consideration, but less likely given gradual transition. 2. Small volume abdominopelvic ascites. 3. Aortic Atherosclerosis (ICD10-I70.0).    EKG: A-fib, QTc 392, early R wave progression, heart rate of 112.  C. difficile and GI pathogen panel was ordered and she was started on ceftriaxone and Flagyl.  5/31: Vitals with RVR at 124, labs with stable corrected sodium at 130.  Procalcitonin negative, some worsening of leukocytosis at 25.5.  Remained on Cardizem infusion.  Cardiology was also consulted.

## 2022-12-09 NOTE — ED Notes (Signed)
Patient and family stating that they want to go home. MD Nelson Chimes notified via secure chat. MD states that if patient and family want to leave, they will be leaving against medical advice. This RN explained to patient and family that if the patient leaves against medical advice, the patient's family will be responsible for getting her home. Patient and family voiced understanding, and are currently working on the patients transportation home amongst family.

## 2022-12-09 NOTE — Assessment & Plan Note (Signed)
Estimated body mass index is 19.96 kg/m as calculated from the following:   Height as of this encounter: 5\' 5"  (1.651 m).   Weight as of this encounter: 54.4 kg.   -Nutritional consult -Continue with supplement

## 2022-12-09 NOTE — Assessment & Plan Note (Signed)
Met sepsis criteria with leukocytosis and tachycardia.  Lactic acid normal.  Most likely secondary to viral enteritis.  Procalcitonin negative.  Preliminary blood cultures negative. -Continue with Rocephin and Flagyl for now -Continue with supportive care

## 2022-12-10 ENCOUNTER — Telehealth: Payer: Self-pay | Admitting: Internal Medicine

## 2022-12-10 DIAGNOSIS — I480 Paroxysmal atrial fibrillation: Secondary | ICD-10-CM

## 2022-12-10 DIAGNOSIS — E871 Hypo-osmolality and hyponatremia: Secondary | ICD-10-CM

## 2022-12-10 DIAGNOSIS — I10 Essential (primary) hypertension: Secondary | ICD-10-CM

## 2022-12-10 DIAGNOSIS — I5032 Chronic diastolic (congestive) heart failure: Secondary | ICD-10-CM

## 2022-12-10 DIAGNOSIS — E7800 Pure hypercholesterolemia, unspecified: Secondary | ICD-10-CM

## 2022-12-10 NOTE — Telephone Encounter (Signed)
Called and spoke to Kaitlyn Huff for update regarding Kaitlyn Huff's condition.  She has slept most of the day.  Blood pressure last check - 140s/80s.  Heart rate remaining in the 120s.  Has not been taking diltiazem.  Will start giving her daily diltiazem.  Discussed hospice referral.  Agreeable.  Order placed for hospice referral.

## 2022-12-10 NOTE — Telephone Encounter (Signed)
Called to check on Ms Reichling.  She was just waking up.  Spoke to [place] and Ms Nordstrom.  States she had a good night last night.  Slept some.  She did eat.  Was just starting to wake up this am.  Denied diarrhea.  No vomiting.  Completing her abx.  She is on prednisone currently.  Takes intermittently.  Discussed importance of trying to eat.  Bland foods.  Has zofran if needed.  They will monitor her closely.  Discussed need for monitoring.  Discussed need for f/u regarding her sodium.  Will call with update.

## 2022-12-11 DIAGNOSIS — J9601 Acute respiratory failure with hypoxia: Secondary | ICD-10-CM | POA: Diagnosis not present

## 2022-12-11 DIAGNOSIS — I5030 Unspecified diastolic (congestive) heart failure: Secondary | ICD-10-CM | POA: Diagnosis not present

## 2022-12-11 DIAGNOSIS — I480 Paroxysmal atrial fibrillation: Secondary | ICD-10-CM | POA: Diagnosis not present

## 2022-12-11 DIAGNOSIS — N1831 Chronic kidney disease, stage 3a: Secondary | ICD-10-CM | POA: Diagnosis not present

## 2022-12-11 DIAGNOSIS — Z8719 Personal history of other diseases of the digestive system: Secondary | ICD-10-CM | POA: Diagnosis not present

## 2022-12-11 DIAGNOSIS — I21A1 Myocardial infarction type 2: Secondary | ICD-10-CM | POA: Diagnosis not present

## 2022-12-11 DIAGNOSIS — E039 Hypothyroidism, unspecified: Secondary | ICD-10-CM | POA: Diagnosis not present

## 2022-12-11 DIAGNOSIS — D631 Anemia in chronic kidney disease: Secondary | ICD-10-CM | POA: Diagnosis not present

## 2022-12-11 DIAGNOSIS — I130 Hypertensive heart and chronic kidney disease with heart failure and stage 1 through stage 4 chronic kidney disease, or unspecified chronic kidney disease: Secondary | ICD-10-CM | POA: Diagnosis not present

## 2022-12-11 DIAGNOSIS — E440 Moderate protein-calorie malnutrition: Secondary | ICD-10-CM | POA: Diagnosis not present

## 2022-12-11 DIAGNOSIS — K529 Noninfective gastroenteritis and colitis, unspecified: Secondary | ICD-10-CM | POA: Diagnosis not present

## 2022-12-11 DIAGNOSIS — Z6820 Body mass index (BMI) 20.0-20.9, adult: Secondary | ICD-10-CM | POA: Diagnosis not present

## 2022-12-11 DIAGNOSIS — G9340 Encephalopathy, unspecified: Secondary | ICD-10-CM | POA: Diagnosis not present

## 2022-12-11 DIAGNOSIS — K219 Gastro-esophageal reflux disease without esophagitis: Secondary | ICD-10-CM | POA: Diagnosis not present

## 2022-12-11 DIAGNOSIS — E871 Hypo-osmolality and hyponatremia: Secondary | ICD-10-CM | POA: Diagnosis not present

## 2022-12-11 DIAGNOSIS — M069 Rheumatoid arthritis, unspecified: Secondary | ICD-10-CM | POA: Diagnosis not present

## 2022-12-11 LAB — CULTURE, BLOOD (ROUTINE X 2): Culture: NO GROWTH

## 2022-12-12 ENCOUNTER — Telehealth: Payer: Self-pay | Admitting: *Deleted

## 2022-12-12 DIAGNOSIS — I130 Hypertensive heart and chronic kidney disease with heart failure and stage 1 through stage 4 chronic kidney disease, or unspecified chronic kidney disease: Secondary | ICD-10-CM | POA: Diagnosis not present

## 2022-12-12 DIAGNOSIS — J9601 Acute respiratory failure with hypoxia: Secondary | ICD-10-CM | POA: Diagnosis not present

## 2022-12-12 DIAGNOSIS — I480 Paroxysmal atrial fibrillation: Secondary | ICD-10-CM | POA: Diagnosis not present

## 2022-12-12 DIAGNOSIS — N1831 Chronic kidney disease, stage 3a: Secondary | ICD-10-CM | POA: Diagnosis not present

## 2022-12-12 DIAGNOSIS — I5030 Unspecified diastolic (congestive) heart failure: Secondary | ICD-10-CM | POA: Diagnosis not present

## 2022-12-12 DIAGNOSIS — G9340 Encephalopathy, unspecified: Secondary | ICD-10-CM | POA: Diagnosis not present

## 2022-12-12 LAB — CULTURE, BLOOD (ROUTINE X 2)

## 2022-12-12 NOTE — Telephone Encounter (Signed)
Arline Asp from [facility] called stating the provider signed hospice orders instead of home health [phone]

## 2022-12-12 NOTE — Telephone Encounter (Signed)
LM for Cindy. Hospice referral has been discussed with patient. Dr Lorin Picket and patient decided to move forward with hospice

## 2022-12-12 NOTE — Transitions of Care (Post Inpatient/ED Visit) (Signed)
   12/12/2022  Name: Kaitlyn Huff MRN: [id number] DOB: 1941/01/11  Today's TOC FU Call Status: Today's TOC FU Call Status:: Successful TOC FU Call Competed TOC FU Call Complete Date: 12/12/22  Transition Care Management Follow-up Telephone Call Date of Discharge: 12/09/22 Discharge Facility: [facility]) Type of Discharge: Emergency Department Primary Inpatient Discharge Diagnosis:: Enteritis RN called and spoke with Kendal Hymen daughter. She stated that Hospice has been called in and they don't need our services. RN explained that this call is to follow up when patient has been been discharged from the hospital to see how they are doing and make sure they understand discharge instructions and have appointments. . She was agreeable and stated she is doing ok.      Gean Maidens BSN RN [facility] [phone]

## 2022-12-13 DIAGNOSIS — N1831 Chronic kidney disease, stage 3a: Secondary | ICD-10-CM | POA: Diagnosis not present

## 2022-12-13 DIAGNOSIS — I5030 Unspecified diastolic (congestive) heart failure: Secondary | ICD-10-CM | POA: Diagnosis not present

## 2022-12-13 DIAGNOSIS — I130 Hypertensive heart and chronic kidney disease with heart failure and stage 1 through stage 4 chronic kidney disease, or unspecified chronic kidney disease: Secondary | ICD-10-CM | POA: Diagnosis not present

## 2022-12-13 DIAGNOSIS — I10 Essential (primary) hypertension: Secondary | ICD-10-CM | POA: Diagnosis not present

## 2022-12-13 DIAGNOSIS — J9601 Acute respiratory failure with hypoxia: Secondary | ICD-10-CM | POA: Diagnosis not present

## 2022-12-13 DIAGNOSIS — I480 Paroxysmal atrial fibrillation: Secondary | ICD-10-CM | POA: Diagnosis not present

## 2022-12-13 DIAGNOSIS — G9340 Encephalopathy, unspecified: Secondary | ICD-10-CM | POA: Diagnosis not present

## 2022-12-13 LAB — CULTURE, BLOOD (ROUTINE X 2)
Culture: NO GROWTH
Special Requests: ADEQUATE
Special Requests: ADEQUATE

## 2022-12-13 NOTE — Telephone Encounter (Signed)
Spoke with [employer]. She is aware that patient has moved forward with hospice referral vs. Home health.

## 2022-12-14 DIAGNOSIS — G9340 Encephalopathy, unspecified: Secondary | ICD-10-CM | POA: Diagnosis not present

## 2022-12-14 DIAGNOSIS — I480 Paroxysmal atrial fibrillation: Secondary | ICD-10-CM | POA: Diagnosis not present

## 2022-12-14 DIAGNOSIS — I5030 Unspecified diastolic (congestive) heart failure: Secondary | ICD-10-CM | POA: Diagnosis not present

## 2022-12-14 DIAGNOSIS — I130 Hypertensive heart and chronic kidney disease with heart failure and stage 1 through stage 4 chronic kidney disease, or unspecified chronic kidney disease: Secondary | ICD-10-CM | POA: Diagnosis not present

## 2022-12-14 DIAGNOSIS — J9601 Acute respiratory failure with hypoxia: Secondary | ICD-10-CM | POA: Diagnosis not present

## 2022-12-14 DIAGNOSIS — N1831 Chronic kidney disease, stage 3a: Secondary | ICD-10-CM | POA: Diagnosis not present

## 2022-12-14 NOTE — Telephone Encounter (Signed)
Jeannie from [employer] called stating she need an order to remove suture and pt is having issues with dizziness and her afib is acting up.   [phone]

## 2022-12-15 DIAGNOSIS — N1831 Chronic kidney disease, stage 3a: Secondary | ICD-10-CM | POA: Diagnosis not present

## 2022-12-15 DIAGNOSIS — J9601 Acute respiratory failure with hypoxia: Secondary | ICD-10-CM | POA: Diagnosis not present

## 2022-12-15 DIAGNOSIS — G9340 Encephalopathy, unspecified: Secondary | ICD-10-CM | POA: Diagnosis not present

## 2022-12-15 DIAGNOSIS — I5030 Unspecified diastolic (congestive) heart failure: Secondary | ICD-10-CM | POA: Diagnosis not present

## 2022-12-15 DIAGNOSIS — I480 Paroxysmal atrial fibrillation: Secondary | ICD-10-CM | POA: Diagnosis not present

## 2022-12-15 DIAGNOSIS — I130 Hypertensive heart and chronic kidney disease with heart failure and stage 1 through stage 4 chronic kidney disease, or unspecified chronic kidney disease: Secondary | ICD-10-CM | POA: Diagnosis not present

## 2022-12-15 NOTE — Telephone Encounter (Signed)
LM to give verbal for xanax prescription.

## 2022-12-15 NOTE — Telephone Encounter (Signed)
Reviewed blood pressure and heart rate.  Improved from previous readings that family had provided.  Ok to prescribe xanax .25mg  q 6 hours prn.  Ok to give verbal order.  Let us know if needs anything more.

## 2022-12-15 NOTE — Telephone Encounter (Signed)
Kaitlyn Huff received ordered from hospice MD yesterday to remove sutures because they were 37 weeks old and needed to come out. Also noted that patient has been having delusions/hallucinations. Also increased anxiety because she thinks "someone is trying to get her." Family gave nitro and extra cardizem prior to hospice nurse arriving because her HR showed 160 on their pulse ox. Hospice nurse explained that pulse ox is not accurate if HR is irregular.   Vitals from hospice nurse: 121/61. HR 91  Kaitlyn Huff from hospice requesting Xanax q 6hrs to give pt for the anxiety.

## 2022-12-16 ENCOUNTER — Telehealth: Payer: Self-pay | Admitting: Internal Medicine

## 2022-12-16 DIAGNOSIS — G9340 Encephalopathy, unspecified: Secondary | ICD-10-CM | POA: Diagnosis not present

## 2022-12-16 DIAGNOSIS — I5030 Unspecified diastolic (congestive) heart failure: Secondary | ICD-10-CM | POA: Diagnosis not present

## 2022-12-16 DIAGNOSIS — I480 Paroxysmal atrial fibrillation: Secondary | ICD-10-CM | POA: Diagnosis not present

## 2022-12-16 DIAGNOSIS — I130 Hypertensive heart and chronic kidney disease with heart failure and stage 1 through stage 4 chronic kidney disease, or unspecified chronic kidney disease: Secondary | ICD-10-CM | POA: Diagnosis not present

## 2022-12-16 DIAGNOSIS — J9601 Acute respiratory failure with hypoxia: Secondary | ICD-10-CM | POA: Diagnosis not present

## 2022-12-16 DIAGNOSIS — N1831 Chronic kidney disease, stage 3a: Secondary | ICD-10-CM | POA: Diagnosis not present

## 2022-12-16 NOTE — Telephone Encounter (Signed)
I am ok to see how she does with this dose - given treating presumed arthritis flare.  Have them keep [place] posted if feel anything different needed.

## 2022-12-16 NOTE — Telephone Encounter (Signed)
Hospice is aware

## 2022-12-16 NOTE — Telephone Encounter (Signed)
Jenine nurse from hospice called in stating that she would like to speack to Dr. Lorin Picket or her nurse regarding pt, she states pt its having right knee pain.

## 2022-12-16 NOTE — Telephone Encounter (Signed)
Verbal to [facility] for xanax .25 mg q 6hrs prn.

## 2022-12-16 NOTE — Telephone Encounter (Signed)
Kaitlyn Huff is wondering if she needed a stronger taper. Do you want her to complete the one she is on first to see if symptoms clear?

## 2022-12-16 NOTE — Telephone Encounter (Signed)
During hospitalization, knee was irrigated and cultures negative for infection.  Agree with prednisone.  (Question if arthritis flare).  Agree with elevation, treating inflammation, etc. Let me know if feels needs anything more or any problems.

## 2022-12-16 NOTE — Telephone Encounter (Signed)
Pt having right knee pain. Hospice put pt on a Prednisone 10 mg bid for 3 days, 5 mg bid x 3day, 5 mg daily on 6/2 when she came on service. Sutures were removed and incision looks great but there is some swelling but no redness or warmth. Jeanine recommeded elevation, heat and ice and advised patient that she would reach out to [place].

## 2022-12-17 DIAGNOSIS — G9340 Encephalopathy, unspecified: Secondary | ICD-10-CM | POA: Diagnosis not present

## 2022-12-17 DIAGNOSIS — I5030 Unspecified diastolic (congestive) heart failure: Secondary | ICD-10-CM | POA: Diagnosis not present

## 2022-12-17 DIAGNOSIS — J9601 Acute respiratory failure with hypoxia: Secondary | ICD-10-CM | POA: Diagnosis not present

## 2022-12-17 DIAGNOSIS — I130 Hypertensive heart and chronic kidney disease with heart failure and stage 1 through stage 4 chronic kidney disease, or unspecified chronic kidney disease: Secondary | ICD-10-CM | POA: Diagnosis not present

## 2022-12-17 DIAGNOSIS — I480 Paroxysmal atrial fibrillation: Secondary | ICD-10-CM | POA: Diagnosis not present

## 2022-12-17 DIAGNOSIS — N1831 Chronic kidney disease, stage 3a: Secondary | ICD-10-CM | POA: Diagnosis not present

## 2022-12-19 DIAGNOSIS — I480 Paroxysmal atrial fibrillation: Secondary | ICD-10-CM | POA: Diagnosis not present

## 2022-12-19 DIAGNOSIS — I5030 Unspecified diastolic (congestive) heart failure: Secondary | ICD-10-CM | POA: Diagnosis not present

## 2022-12-19 DIAGNOSIS — J9601 Acute respiratory failure with hypoxia: Secondary | ICD-10-CM | POA: Diagnosis not present

## 2022-12-19 DIAGNOSIS — I130 Hypertensive heart and chronic kidney disease with heart failure and stage 1 through stage 4 chronic kidney disease, or unspecified chronic kidney disease: Secondary | ICD-10-CM | POA: Diagnosis not present

## 2022-12-19 DIAGNOSIS — G9340 Encephalopathy, unspecified: Secondary | ICD-10-CM | POA: Diagnosis not present

## 2022-12-19 DIAGNOSIS — N1831 Chronic kidney disease, stage 3a: Secondary | ICD-10-CM | POA: Diagnosis not present

## 2022-12-20 DIAGNOSIS — J9601 Acute respiratory failure with hypoxia: Secondary | ICD-10-CM | POA: Diagnosis not present

## 2022-12-20 DIAGNOSIS — I5030 Unspecified diastolic (congestive) heart failure: Secondary | ICD-10-CM | POA: Diagnosis not present

## 2022-12-20 DIAGNOSIS — I130 Hypertensive heart and chronic kidney disease with heart failure and stage 1 through stage 4 chronic kidney disease, or unspecified chronic kidney disease: Secondary | ICD-10-CM | POA: Diagnosis not present

## 2022-12-20 DIAGNOSIS — G9340 Encephalopathy, unspecified: Secondary | ICD-10-CM | POA: Diagnosis not present

## 2022-12-20 DIAGNOSIS — N1831 Chronic kidney disease, stage 3a: Secondary | ICD-10-CM | POA: Diagnosis not present

## 2022-12-20 DIAGNOSIS — I480 Paroxysmal atrial fibrillation: Secondary | ICD-10-CM | POA: Diagnosis not present

## 2022-12-22 DIAGNOSIS — G9340 Encephalopathy, unspecified: Secondary | ICD-10-CM | POA: Diagnosis not present

## 2022-12-22 DIAGNOSIS — I480 Paroxysmal atrial fibrillation: Secondary | ICD-10-CM | POA: Diagnosis not present

## 2022-12-22 DIAGNOSIS — J9601 Acute respiratory failure with hypoxia: Secondary | ICD-10-CM | POA: Diagnosis not present

## 2022-12-22 DIAGNOSIS — I5030 Unspecified diastolic (congestive) heart failure: Secondary | ICD-10-CM | POA: Diagnosis not present

## 2022-12-22 DIAGNOSIS — I130 Hypertensive heart and chronic kidney disease with heart failure and stage 1 through stage 4 chronic kidney disease, or unspecified chronic kidney disease: Secondary | ICD-10-CM | POA: Diagnosis not present

## 2022-12-22 DIAGNOSIS — N1831 Chronic kidney disease, stage 3a: Secondary | ICD-10-CM | POA: Diagnosis not present

## 2022-12-23 ENCOUNTER — Other Ambulatory Visit: Payer: Self-pay | Admitting: Internal Medicine

## 2022-12-23 DIAGNOSIS — G9340 Encephalopathy, unspecified: Secondary | ICD-10-CM | POA: Diagnosis not present

## 2022-12-23 DIAGNOSIS — N1831 Chronic kidney disease, stage 3a: Secondary | ICD-10-CM | POA: Diagnosis not present

## 2022-12-23 DIAGNOSIS — J9601 Acute respiratory failure with hypoxia: Secondary | ICD-10-CM | POA: Diagnosis not present

## 2022-12-23 DIAGNOSIS — I5030 Unspecified diastolic (congestive) heart failure: Secondary | ICD-10-CM | POA: Diagnosis not present

## 2022-12-23 DIAGNOSIS — I480 Paroxysmal atrial fibrillation: Secondary | ICD-10-CM | POA: Diagnosis not present

## 2022-12-23 DIAGNOSIS — I130 Hypertensive heart and chronic kidney disease with heart failure and stage 1 through stage 4 chronic kidney disease, or unspecified chronic kidney disease: Secondary | ICD-10-CM | POA: Diagnosis not present

## 2022-12-24 DIAGNOSIS — I130 Hypertensive heart and chronic kidney disease with heart failure and stage 1 through stage 4 chronic kidney disease, or unspecified chronic kidney disease: Secondary | ICD-10-CM | POA: Diagnosis not present

## 2022-12-24 DIAGNOSIS — N1831 Chronic kidney disease, stage 3a: Secondary | ICD-10-CM | POA: Diagnosis not present

## 2022-12-24 DIAGNOSIS — J9601 Acute respiratory failure with hypoxia: Secondary | ICD-10-CM | POA: Diagnosis not present

## 2022-12-24 DIAGNOSIS — G9340 Encephalopathy, unspecified: Secondary | ICD-10-CM | POA: Diagnosis not present

## 2022-12-24 DIAGNOSIS — I5030 Unspecified diastolic (congestive) heart failure: Secondary | ICD-10-CM | POA: Diagnosis not present

## 2022-12-24 DIAGNOSIS — I480 Paroxysmal atrial fibrillation: Secondary | ICD-10-CM | POA: Diagnosis not present

## 2022-12-26 ENCOUNTER — Telehealth: Payer: Self-pay

## 2022-12-26 DIAGNOSIS — I130 Hypertensive heart and chronic kidney disease with heart failure and stage 1 through stage 4 chronic kidney disease, or unspecified chronic kidney disease: Secondary | ICD-10-CM | POA: Diagnosis not present

## 2022-12-26 DIAGNOSIS — I5030 Unspecified diastolic (congestive) heart failure: Secondary | ICD-10-CM | POA: Diagnosis not present

## 2022-12-26 DIAGNOSIS — N1831 Chronic kidney disease, stage 3a: Secondary | ICD-10-CM | POA: Diagnosis not present

## 2022-12-26 DIAGNOSIS — G9340 Encephalopathy, unspecified: Secondary | ICD-10-CM | POA: Diagnosis not present

## 2022-12-26 DIAGNOSIS — I480 Paroxysmal atrial fibrillation: Secondary | ICD-10-CM | POA: Diagnosis not present

## 2022-12-26 DIAGNOSIS — J9601 Acute respiratory failure with hypoxia: Secondary | ICD-10-CM | POA: Diagnosis not present

## 2022-12-26 NOTE — Telephone Encounter (Signed)
Called and spoke to [place].  Discussed ok for seroquel 25mg  q hs.  Also had initially given verbal order ok to start oxybutynin.  I did ask them to check a urinalysis and culture to confirm no infection - given hallucinations, urine change and bladder spasm.  Please call her back and have them hold oxybutynin , until we get the urine back and see if infection is what is contributing to her current symptoms.  Ok for the seroquel

## 2022-12-26 NOTE — Telephone Encounter (Signed)
Ok to give orders for this?

## 2022-12-26 NOTE — Telephone Encounter (Signed)
LM for hospice nurse to return my call.

## 2022-12-26 NOTE — Telephone Encounter (Signed)
Jeanine called from [facility] to state she needs Dr. Westley Hummer Scott's ok to order Oxybutynin for patient's bladder spasms and Seroquel for patient's restlessness and agitation.

## 2022-12-27 DIAGNOSIS — G9340 Encephalopathy, unspecified: Secondary | ICD-10-CM | POA: Diagnosis not present

## 2022-12-27 DIAGNOSIS — I130 Hypertensive heart and chronic kidney disease with heart failure and stage 1 through stage 4 chronic kidney disease, or unspecified chronic kidney disease: Secondary | ICD-10-CM | POA: Diagnosis not present

## 2022-12-27 DIAGNOSIS — I5030 Unspecified diastolic (congestive) heart failure: Secondary | ICD-10-CM | POA: Diagnosis not present

## 2022-12-27 DIAGNOSIS — J9601 Acute respiratory failure with hypoxia: Secondary | ICD-10-CM | POA: Diagnosis not present

## 2022-12-27 DIAGNOSIS — I480 Paroxysmal atrial fibrillation: Secondary | ICD-10-CM | POA: Diagnosis not present

## 2022-12-27 DIAGNOSIS — N1831 Chronic kidney disease, stage 3a: Secondary | ICD-10-CM | POA: Diagnosis not present

## 2022-12-27 NOTE — Telephone Encounter (Signed)
Spoke with Jeanine and gave verbal to hold oxybutynin until urine results are back to confirm no infection.

## 2022-12-28 DIAGNOSIS — N1831 Chronic kidney disease, stage 3a: Secondary | ICD-10-CM | POA: Diagnosis not present

## 2022-12-28 DIAGNOSIS — I480 Paroxysmal atrial fibrillation: Secondary | ICD-10-CM | POA: Diagnosis not present

## 2022-12-28 DIAGNOSIS — I130 Hypertensive heart and chronic kidney disease with heart failure and stage 1 through stage 4 chronic kidney disease, or unspecified chronic kidney disease: Secondary | ICD-10-CM | POA: Diagnosis not present

## 2022-12-28 DIAGNOSIS — I5030 Unspecified diastolic (congestive) heart failure: Secondary | ICD-10-CM | POA: Diagnosis not present

## 2022-12-28 DIAGNOSIS — J9601 Acute respiratory failure with hypoxia: Secondary | ICD-10-CM | POA: Diagnosis not present

## 2022-12-28 DIAGNOSIS — G9340 Encephalopathy, unspecified: Secondary | ICD-10-CM | POA: Diagnosis not present

## 2022-12-29 DIAGNOSIS — N1831 Chronic kidney disease, stage 3a: Secondary | ICD-10-CM | POA: Diagnosis not present

## 2022-12-29 DIAGNOSIS — G9340 Encephalopathy, unspecified: Secondary | ICD-10-CM | POA: Diagnosis not present

## 2022-12-29 DIAGNOSIS — J9601 Acute respiratory failure with hypoxia: Secondary | ICD-10-CM | POA: Diagnosis not present

## 2022-12-29 DIAGNOSIS — I480 Paroxysmal atrial fibrillation: Secondary | ICD-10-CM | POA: Diagnosis not present

## 2022-12-29 DIAGNOSIS — I5030 Unspecified diastolic (congestive) heart failure: Secondary | ICD-10-CM | POA: Diagnosis not present

## 2022-12-29 DIAGNOSIS — I130 Hypertensive heart and chronic kidney disease with heart failure and stage 1 through stage 4 chronic kidney disease, or unspecified chronic kidney disease: Secondary | ICD-10-CM | POA: Diagnosis not present

## 2022-12-30 DIAGNOSIS — G9340 Encephalopathy, unspecified: Secondary | ICD-10-CM | POA: Diagnosis not present

## 2022-12-30 DIAGNOSIS — I480 Paroxysmal atrial fibrillation: Secondary | ICD-10-CM | POA: Diagnosis not present

## 2022-12-30 DIAGNOSIS — I5030 Unspecified diastolic (congestive) heart failure: Secondary | ICD-10-CM | POA: Diagnosis not present

## 2022-12-30 DIAGNOSIS — N1831 Chronic kidney disease, stage 3a: Secondary | ICD-10-CM | POA: Diagnosis not present

## 2022-12-30 DIAGNOSIS — I130 Hypertensive heart and chronic kidney disease with heart failure and stage 1 through stage 4 chronic kidney disease, or unspecified chronic kidney disease: Secondary | ICD-10-CM | POA: Diagnosis not present

## 2022-12-30 DIAGNOSIS — J9601 Acute respiratory failure with hypoxia: Secondary | ICD-10-CM | POA: Diagnosis not present

## 2022-12-31 DIAGNOSIS — I5030 Unspecified diastolic (congestive) heart failure: Secondary | ICD-10-CM | POA: Diagnosis not present

## 2022-12-31 DIAGNOSIS — G9340 Encephalopathy, unspecified: Secondary | ICD-10-CM | POA: Diagnosis not present

## 2022-12-31 DIAGNOSIS — N1831 Chronic kidney disease, stage 3a: Secondary | ICD-10-CM | POA: Diagnosis not present

## 2022-12-31 DIAGNOSIS — I480 Paroxysmal atrial fibrillation: Secondary | ICD-10-CM | POA: Diagnosis not present

## 2022-12-31 DIAGNOSIS — I130 Hypertensive heart and chronic kidney disease with heart failure and stage 1 through stage 4 chronic kidney disease, or unspecified chronic kidney disease: Secondary | ICD-10-CM | POA: Diagnosis not present

## 2022-12-31 DIAGNOSIS — J9601 Acute respiratory failure with hypoxia: Secondary | ICD-10-CM | POA: Diagnosis not present

## 2023-01-01 DIAGNOSIS — J9601 Acute respiratory failure with hypoxia: Secondary | ICD-10-CM | POA: Diagnosis not present

## 2023-01-01 DIAGNOSIS — I480 Paroxysmal atrial fibrillation: Secondary | ICD-10-CM | POA: Diagnosis not present

## 2023-01-01 DIAGNOSIS — G9340 Encephalopathy, unspecified: Secondary | ICD-10-CM | POA: Diagnosis not present

## 2023-01-01 DIAGNOSIS — I130 Hypertensive heart and chronic kidney disease with heart failure and stage 1 through stage 4 chronic kidney disease, or unspecified chronic kidney disease: Secondary | ICD-10-CM | POA: Diagnosis not present

## 2023-01-01 DIAGNOSIS — N1831 Chronic kidney disease, stage 3a: Secondary | ICD-10-CM | POA: Diagnosis not present

## 2023-01-01 DIAGNOSIS — I5030 Unspecified diastolic (congestive) heart failure: Secondary | ICD-10-CM | POA: Diagnosis not present

## 2023-01-02 DIAGNOSIS — N1831 Chronic kidney disease, stage 3a: Secondary | ICD-10-CM | POA: Diagnosis not present

## 2023-01-02 DIAGNOSIS — J9601 Acute respiratory failure with hypoxia: Secondary | ICD-10-CM | POA: Diagnosis not present

## 2023-01-02 DIAGNOSIS — I5030 Unspecified diastolic (congestive) heart failure: Secondary | ICD-10-CM | POA: Diagnosis not present

## 2023-01-02 DIAGNOSIS — I130 Hypertensive heart and chronic kidney disease with heart failure and stage 1 through stage 4 chronic kidney disease, or unspecified chronic kidney disease: Secondary | ICD-10-CM | POA: Diagnosis not present

## 2023-01-02 DIAGNOSIS — I480 Paroxysmal atrial fibrillation: Secondary | ICD-10-CM | POA: Diagnosis not present

## 2023-01-02 DIAGNOSIS — G9340 Encephalopathy, unspecified: Secondary | ICD-10-CM | POA: Diagnosis not present

## 2023-01-03 DIAGNOSIS — N1831 Chronic kidney disease, stage 3a: Secondary | ICD-10-CM | POA: Diagnosis not present

## 2023-01-03 DIAGNOSIS — J9601 Acute respiratory failure with hypoxia: Secondary | ICD-10-CM | POA: Diagnosis not present

## 2023-01-03 DIAGNOSIS — I5030 Unspecified diastolic (congestive) heart failure: Secondary | ICD-10-CM | POA: Diagnosis not present

## 2023-01-03 DIAGNOSIS — I480 Paroxysmal atrial fibrillation: Secondary | ICD-10-CM | POA: Diagnosis not present

## 2023-01-03 DIAGNOSIS — I130 Hypertensive heart and chronic kidney disease with heart failure and stage 1 through stage 4 chronic kidney disease, or unspecified chronic kidney disease: Secondary | ICD-10-CM | POA: Diagnosis not present

## 2023-01-03 DIAGNOSIS — G9340 Encephalopathy, unspecified: Secondary | ICD-10-CM | POA: Diagnosis not present

## 2023-01-09 DEATH — deceased

## 2023-01-10 ENCOUNTER — Ambulatory Visit: Payer: Self-pay | Admitting: *Deleted

## 2023-01-10 NOTE — Patient Outreach (Signed)
  Care Coordination   Case closure   Visit Note   01/10/2023 Name: LYNDALL FAVA MRN: [id number] DOB: 08/15/1940  DEZTINEE AIKEN is a 82 y.o. year old female who sees Dale Congers, MD for primary care. I  confirmed the passing of patient prior to outreach  What matters to the patients health and wellness today?  Decease 20-Jan-2023    Goals Addressed   None     SDOH assessments and interventions completed:  No     Care Coordination Interventions:  No, not indicated   Follow up plan: No further intervention required.   Encounter Outcome:  Pt. Visit Completed   Meegan Shanafelt L. Noelle Penner, RN, BSN, CCM [facility] Care Management Community Coordinator Office number [phone]

## 2023-02-07 ENCOUNTER — Ambulatory Visit: Payer: Medicare Other | Admitting: Internal Medicine

## 2023-03-06 ENCOUNTER — Ambulatory Visit: Payer: Self-pay | Admitting: *Deleted

## 2023-03-06 NOTE — Patient Outreach (Signed)
Opened in error  Agency L. Noelle Penner, RN, BSN, CCM [facility] Care Management Community Coordinator Office number [phone]
